# Patient Record
Sex: Female | Born: 1965 | Race: White | Hispanic: No | Marital: Single | Smoking: Former smoker
Health system: Southern US, Community
[De-identification: ages and names within clinical notes are randomized; demographics above are authoritative.]

## PROBLEM LIST (undated history)

## (undated) DIAGNOSIS — F32A Depression, unspecified: Secondary | ICD-10-CM

## (undated) DIAGNOSIS — Z9889 Other specified postprocedural states: Secondary | ICD-10-CM

## (undated) DIAGNOSIS — R609 Edema, unspecified: Secondary | ICD-10-CM

## (undated) DIAGNOSIS — C801 Malignant (primary) neoplasm, unspecified: Secondary | ICD-10-CM

## (undated) DIAGNOSIS — F329 Major depressive disorder, single episode, unspecified: Secondary | ICD-10-CM

## (undated) DIAGNOSIS — N3010 Interstitial cystitis (chronic) without hematuria: Secondary | ICD-10-CM

## (undated) DIAGNOSIS — F419 Anxiety disorder, unspecified: Secondary | ICD-10-CM

## (undated) DIAGNOSIS — IMO0002 Reserved for concepts with insufficient information to code with codable children: Secondary | ICD-10-CM

## (undated) DIAGNOSIS — R0602 Shortness of breath: Secondary | ICD-10-CM

## (undated) DIAGNOSIS — G43909 Migraine, unspecified, not intractable, without status migrainosus: Secondary | ICD-10-CM

## (undated) DIAGNOSIS — R112 Nausea with vomiting, unspecified: Secondary | ICD-10-CM

## (undated) DIAGNOSIS — M1480 Arthropathies in other specified diseases classified elsewhere, unspecified site: Secondary | ICD-10-CM

## (undated) DIAGNOSIS — K760 Fatty (change of) liver, not elsewhere classified: Secondary | ICD-10-CM

## (undated) DIAGNOSIS — M797 Fibromyalgia: Secondary | ICD-10-CM

## (undated) DIAGNOSIS — J4489 Other specified chronic obstructive pulmonary disease: Secondary | ICD-10-CM

## (undated) DIAGNOSIS — M810 Age-related osteoporosis without current pathological fracture: Secondary | ICD-10-CM

## (undated) DIAGNOSIS — K219 Gastro-esophageal reflux disease without esophagitis: Secondary | ICD-10-CM

## (undated) DIAGNOSIS — E119 Type 2 diabetes mellitus without complications: Secondary | ICD-10-CM

## (undated) DIAGNOSIS — G8929 Other chronic pain: Secondary | ICD-10-CM

## (undated) DIAGNOSIS — B343 Parvovirus infection, unspecified: Secondary | ICD-10-CM

## (undated) DIAGNOSIS — G4733 Obstructive sleep apnea (adult) (pediatric): Secondary | ICD-10-CM

## (undated) DIAGNOSIS — R7611 Nonspecific reaction to tuberculin skin test without active tuberculosis: Secondary | ICD-10-CM

## (undated) DIAGNOSIS — J383 Other diseases of vocal cords: Secondary | ICD-10-CM

## (undated) DIAGNOSIS — E039 Hypothyroidism, unspecified: Secondary | ICD-10-CM

## (undated) DIAGNOSIS — G629 Polyneuropathy, unspecified: Secondary | ICD-10-CM

## (undated) DIAGNOSIS — G4730 Sleep apnea, unspecified: Secondary | ICD-10-CM

## (undated) DIAGNOSIS — E785 Hyperlipidemia, unspecified: Secondary | ICD-10-CM

## (undated) DIAGNOSIS — J449 Chronic obstructive pulmonary disease, unspecified: Secondary | ICD-10-CM

## (undated) DIAGNOSIS — K3184 Gastroparesis: Secondary | ICD-10-CM

## (undated) DIAGNOSIS — T7840XA Allergy, unspecified, initial encounter: Secondary | ICD-10-CM

## (undated) DIAGNOSIS — I10 Essential (primary) hypertension: Secondary | ICD-10-CM

## (undated) HISTORY — DX: Nonspecific reaction to tuberculin skin test without active tuberculosis: R76.11

## (undated) HISTORY — PX: NASAL SINUS SURGERY: SHX719

## (undated) HISTORY — PX: SHOULDER ARTHROSCOPY W/ ROTATOR CUFF REPAIR: SHX2400

## (undated) HISTORY — PX: KNEE ARTHROSCOPY: SHX127

## (undated) HISTORY — DX: Fatty (change of) liver, not elsewhere classified: K76.0

## (undated) HISTORY — DX: Hypothyroidism, unspecified: E03.9

## (undated) HISTORY — DX: Migraine, unspecified, not intractable, without status migrainosus: G43.909

## (undated) HISTORY — DX: Depression, unspecified: F32.A

## (undated) HISTORY — DX: Arthropathies in other specified diseases classified elsewhere, unspecified site: M14.80

## (undated) HISTORY — DX: Edema, unspecified: R60.9

## (undated) HISTORY — PX: OTHER SURGICAL HISTORY: SHX169

## (undated) HISTORY — DX: Major depressive disorder, single episode, unspecified: F32.9

## (undated) HISTORY — DX: Reserved for concepts with insufficient information to code with codable children: IMO0002

## (undated) HISTORY — PX: CHOLECYSTECTOMY: SHX55

## (undated) HISTORY — DX: Parvovirus infection, unspecified: B34.3

## (undated) HISTORY — PX: CARPAL TUNNEL RELEASE: SHX101

## (undated) HISTORY — DX: Other specified chronic obstructive pulmonary disease: J44.89

## (undated) HISTORY — DX: Allergy, unspecified, initial encounter: T78.40XA

## (undated) HISTORY — DX: Chronic obstructive pulmonary disease, unspecified: J44.9

## (undated) HISTORY — DX: Other diseases of vocal cords: J38.3

## (undated) HISTORY — DX: Sleep apnea, unspecified: G47.30

## (undated) HISTORY — DX: Gastroparesis: K31.84

## (undated) HISTORY — DX: Fibromyalgia: M79.7

## (undated) HISTORY — PX: COLONOSCOPY: SHX174

## (undated) HISTORY — DX: Gastro-esophageal reflux disease without esophagitis: K21.9

## (undated) HISTORY — DX: Age-related osteoporosis without current pathological fracture: M81.0

## (undated) HISTORY — DX: Hyperlipidemia, unspecified: E78.5

---

## 1991-07-29 HISTORY — PX: ABDOMINAL HYSTERECTOMY: SHX81

## 1998-12-12 ENCOUNTER — Ambulatory Visit (HOSPITAL_BASED_OUTPATIENT_CLINIC_OR_DEPARTMENT_OTHER): Admission: RE | Admit: 1998-12-12 | Discharge: 1998-12-12 | Payer: Self-pay | Admitting: Orthopedic Surgery

## 1999-02-11 ENCOUNTER — Other Ambulatory Visit: Admission: RE | Admit: 1999-02-11 | Discharge: 1999-02-11 | Payer: Self-pay | Admitting: Obstetrics and Gynecology

## 1999-08-03 ENCOUNTER — Emergency Department (HOSPITAL_COMMUNITY): Admission: EM | Admit: 1999-08-03 | Discharge: 1999-08-03 | Payer: Self-pay | Admitting: Emergency Medicine

## 1999-08-04 ENCOUNTER — Encounter: Payer: Self-pay | Admitting: *Deleted

## 1999-10-21 ENCOUNTER — Encounter: Admission: RE | Admit: 1999-10-21 | Discharge: 1999-10-21 | Payer: Self-pay | Admitting: *Deleted

## 1999-10-21 ENCOUNTER — Encounter: Payer: Self-pay | Admitting: *Deleted

## 1999-10-24 ENCOUNTER — Encounter: Admission: RE | Admit: 1999-10-24 | Discharge: 1999-10-24 | Payer: Self-pay

## 1999-12-13 ENCOUNTER — Encounter (INDEPENDENT_AMBULATORY_CARE_PROVIDER_SITE_OTHER): Payer: Self-pay | Admitting: Specialist

## 1999-12-13 ENCOUNTER — Other Ambulatory Visit: Admission: RE | Admit: 1999-12-13 | Discharge: 1999-12-13 | Payer: Self-pay | Admitting: Otolaryngology

## 2000-07-28 HISTORY — PX: HIP SURGERY: SHX245

## 2000-09-02 ENCOUNTER — Encounter: Payer: Self-pay | Admitting: Family Medicine

## 2000-09-02 ENCOUNTER — Encounter: Admission: RE | Admit: 2000-09-02 | Discharge: 2000-09-02 | Payer: Self-pay | Admitting: Family Medicine

## 2000-09-04 ENCOUNTER — Encounter: Payer: Self-pay | Admitting: Family Medicine

## 2000-09-04 ENCOUNTER — Encounter: Admission: RE | Admit: 2000-09-04 | Discharge: 2000-09-04 | Payer: Self-pay | Admitting: Family Medicine

## 2001-01-22 ENCOUNTER — Ambulatory Visit (HOSPITAL_BASED_OUTPATIENT_CLINIC_OR_DEPARTMENT_OTHER): Admission: RE | Admit: 2001-01-22 | Discharge: 2001-01-23 | Payer: Self-pay | Admitting: Orthopedic Surgery

## 2001-07-22 ENCOUNTER — Encounter: Payer: Self-pay | Admitting: Family Medicine

## 2001-07-22 ENCOUNTER — Ambulatory Visit (HOSPITAL_COMMUNITY): Admission: RE | Admit: 2001-07-22 | Discharge: 2001-07-22 | Payer: Self-pay | Admitting: Family Medicine

## 2002-02-08 ENCOUNTER — Other Ambulatory Visit: Admission: RE | Admit: 2002-02-08 | Discharge: 2002-02-08 | Payer: Self-pay | Admitting: *Deleted

## 2002-07-28 HISTORY — PX: ANTERIOR CERVICAL DECOMP/DISCECTOMY FUSION: SHX1161

## 2002-09-06 ENCOUNTER — Ambulatory Visit (HOSPITAL_COMMUNITY): Admission: RE | Admit: 2002-09-06 | Discharge: 2002-09-06 | Payer: Self-pay | Admitting: Family Medicine

## 2002-09-06 ENCOUNTER — Encounter: Payer: Self-pay | Admitting: Family Medicine

## 2002-09-07 ENCOUNTER — Ambulatory Visit (HOSPITAL_COMMUNITY): Admission: RE | Admit: 2002-09-07 | Discharge: 2002-09-07 | Payer: Self-pay | Admitting: Hematology & Oncology

## 2002-09-07 ENCOUNTER — Encounter: Payer: Self-pay | Admitting: Hematology & Oncology

## 2002-12-14 ENCOUNTER — Ambulatory Visit (HOSPITAL_COMMUNITY): Admission: RE | Admit: 2002-12-14 | Discharge: 2002-12-14 | Payer: Self-pay | Admitting: Hematology & Oncology

## 2002-12-14 ENCOUNTER — Encounter: Payer: Self-pay | Admitting: Hematology & Oncology

## 2003-03-13 ENCOUNTER — Encounter: Payer: Self-pay | Admitting: Neurosurgery

## 2003-03-13 ENCOUNTER — Inpatient Hospital Stay (HOSPITAL_COMMUNITY): Admission: RE | Admit: 2003-03-13 | Discharge: 2003-03-14 | Payer: Self-pay | Admitting: Neurosurgery

## 2003-06-26 ENCOUNTER — Other Ambulatory Visit: Admission: RE | Admit: 2003-06-26 | Discharge: 2003-06-26 | Payer: Self-pay | Admitting: Obstetrics and Gynecology

## 2003-09-08 ENCOUNTER — Encounter (INDEPENDENT_AMBULATORY_CARE_PROVIDER_SITE_OTHER): Payer: Self-pay | Admitting: Specialist

## 2003-09-08 ENCOUNTER — Ambulatory Visit (HOSPITAL_COMMUNITY): Admission: RE | Admit: 2003-09-08 | Discharge: 2003-09-08 | Payer: Self-pay | Admitting: Obstetrics and Gynecology

## 2003-10-09 ENCOUNTER — Observation Stay (HOSPITAL_COMMUNITY): Admission: RE | Admit: 2003-10-09 | Discharge: 2003-10-10 | Payer: Self-pay | Admitting: General Surgery

## 2004-05-10 ENCOUNTER — Encounter (INDEPENDENT_AMBULATORY_CARE_PROVIDER_SITE_OTHER): Payer: Self-pay | Admitting: Specialist

## 2004-05-10 ENCOUNTER — Ambulatory Visit (HOSPITAL_COMMUNITY): Admission: RE | Admit: 2004-05-10 | Discharge: 2004-05-10 | Payer: Self-pay | Admitting: Gastroenterology

## 2004-07-26 ENCOUNTER — Ambulatory Visit (HOSPITAL_COMMUNITY): Admission: RE | Admit: 2004-07-26 | Discharge: 2004-07-26 | Payer: Self-pay | Admitting: Gastroenterology

## 2004-09-11 ENCOUNTER — Ambulatory Visit (HOSPITAL_COMMUNITY): Admission: RE | Admit: 2004-09-11 | Discharge: 2004-09-11 | Payer: Self-pay | Admitting: Family Medicine

## 2004-09-20 ENCOUNTER — Ambulatory Visit (HOSPITAL_COMMUNITY): Admission: RE | Admit: 2004-09-20 | Discharge: 2004-09-20 | Payer: Self-pay | Admitting: Gastroenterology

## 2004-10-17 ENCOUNTER — Encounter: Admission: RE | Admit: 2004-10-17 | Discharge: 2004-10-17 | Payer: Self-pay | Admitting: Family Medicine

## 2005-03-04 ENCOUNTER — Ambulatory Visit: Payer: Self-pay | Admitting: Hematology & Oncology

## 2005-05-08 ENCOUNTER — Ambulatory Visit: Payer: Self-pay | Admitting: Hematology & Oncology

## 2005-05-22 ENCOUNTER — Other Ambulatory Visit: Admission: RE | Admit: 2005-05-22 | Discharge: 2005-05-22 | Payer: Self-pay | Admitting: Obstetrics and Gynecology

## 2005-06-10 ENCOUNTER — Encounter: Admission: RE | Admit: 2005-06-10 | Discharge: 2005-06-10 | Payer: Self-pay | Admitting: Family Medicine

## 2005-07-03 ENCOUNTER — Ambulatory Visit: Payer: Self-pay | Admitting: Hematology & Oncology

## 2005-07-16 ENCOUNTER — Ambulatory Visit (HOSPITAL_COMMUNITY): Admission: RE | Admit: 2005-07-16 | Discharge: 2005-07-16 | Payer: Self-pay | Admitting: Hematology & Oncology

## 2005-07-30 ENCOUNTER — Encounter: Payer: Self-pay | Admitting: Hematology & Oncology

## 2005-09-05 ENCOUNTER — Ambulatory Visit: Payer: Self-pay | Admitting: Hematology & Oncology

## 2005-09-05 ENCOUNTER — Ambulatory Visit: Admission: RE | Admit: 2005-09-05 | Discharge: 2005-09-05 | Payer: Self-pay | Admitting: Hematology & Oncology

## 2005-09-09 ENCOUNTER — Ambulatory Visit (HOSPITAL_COMMUNITY): Admission: RE | Admit: 2005-09-09 | Discharge: 2005-09-09 | Payer: Self-pay | Admitting: Hematology & Oncology

## 2005-10-23 ENCOUNTER — Ambulatory Visit: Payer: Self-pay | Admitting: Hematology & Oncology

## 2005-11-21 LAB — CBC WITH DIFFERENTIAL/PLATELET
BASO%: 0.5 % (ref 0.0–2.0)
Basophils Absolute: 0.1 10*3/uL (ref 0.0–0.1)
EOS%: 8.5 % — ABNORMAL HIGH (ref 0.0–7.0)
Eosinophils Absolute: 0.9 10*3/uL — ABNORMAL HIGH (ref 0.0–0.5)
HCT: 40.9 % (ref 34.8–46.6)
HGB: 14.4 g/dL (ref 11.6–15.9)
LYMPH%: 26.8 % (ref 14.0–48.0)
MCH: 29.8 pg (ref 26.0–34.0)
MCHC: 35.2 g/dL (ref 32.0–36.0)
MCV: 84.8 fL (ref 81.0–101.0)
MONO#: 0.6 10*3/uL (ref 0.1–0.9)
MONO%: 5.6 % (ref 0.0–13.0)
NEUT#: 6.1 10*3/uL (ref 1.5–6.5)
NEUT%: 58.6 % (ref 39.6–76.8)
Platelets: 405 10*3/uL — ABNORMAL HIGH (ref 145–400)
RBC: 4.82 10*6/uL (ref 3.70–5.32)
RDW: 14.4 % (ref 11.3–14.5)
WBC: 10.4 10*3/uL — ABNORMAL HIGH (ref 3.9–10.0)
lymph#: 2.8 10*3/uL (ref 0.9–3.3)

## 2005-11-21 LAB — ERYTHROCYTE SEDIMENTATION RATE: Sed Rate: 17 mm/hr (ref 0–30)

## 2005-12-01 ENCOUNTER — Ambulatory Visit (HOSPITAL_COMMUNITY): Admission: RE | Admit: 2005-12-01 | Discharge: 2005-12-01 | Payer: Self-pay | Admitting: Hematology & Oncology

## 2005-12-09 ENCOUNTER — Ambulatory Visit: Payer: Self-pay | Admitting: Hematology & Oncology

## 2005-12-11 LAB — CBC WITH DIFFERENTIAL/PLATELET
BASO%: 1.2 % (ref 0.0–2.0)
Basophils Absolute: 0.2 10*3/uL — ABNORMAL HIGH (ref 0.0–0.1)
EOS%: 4.8 % (ref 0.0–7.0)
Eosinophils Absolute: 0.8 10*3/uL — ABNORMAL HIGH (ref 0.0–0.5)
HCT: 40.3 % (ref 34.8–46.6)
HGB: 14.3 g/dL (ref 11.6–15.9)
LYMPH%: 16.4 % (ref 14.0–48.0)
MCH: 30.2 pg (ref 26.0–34.0)
MCHC: 35.5 g/dL (ref 32.0–36.0)
MCV: 85 fL (ref 81.0–101.0)
MONO#: 0.7 10*3/uL (ref 0.1–0.9)
MONO%: 4.2 % (ref 0.0–13.0)
NEUT#: 12.2 10*3/uL — ABNORMAL HIGH (ref 1.5–6.5)
NEUT%: 73.4 % (ref 39.6–76.8)
Platelets: 409 10*3/uL — ABNORMAL HIGH (ref 145–400)
RBC: 4.74 10*6/uL (ref 3.70–5.32)
RDW: 14.3 % (ref 11.3–14.5)
WBC: 16.6 10*3/uL — ABNORMAL HIGH (ref 3.9–10.0)
lymph#: 2.7 10*3/uL (ref 0.9–3.3)

## 2005-12-11 LAB — COMPREHENSIVE METABOLIC PANEL
ALT: 29 U/L (ref 0–40)
AST: 19 U/L (ref 0–37)
Albumin: 4.8 g/dL (ref 3.5–5.2)
Alkaline Phosphatase: 76 U/L (ref 39–117)
BUN: 11 mg/dL (ref 6–23)
CO2: 25 mEq/L (ref 19–32)
Calcium: 9.7 mg/dL (ref 8.4–10.5)
Chloride: 100 mEq/L (ref 96–112)
Creatinine, Ser: 0.7 mg/dL (ref 0.4–1.2)
Glucose, Bld: 91 mg/dL (ref 70–99)
Potassium: 4.2 mEq/L (ref 3.5–5.3)
Sodium: 139 mEq/L (ref 135–145)
Total Bilirubin: 0.5 mg/dL (ref 0.3–1.2)
Total Protein: 8.1 g/dL (ref 6.0–8.3)

## 2005-12-11 LAB — TSH: TSH: 1.643 u[IU]/mL (ref 0.350–5.500)

## 2005-12-11 LAB — ERYTHROCYTE SEDIMENTATION RATE: Sed Rate: 8 mm/hr (ref 0–30)

## 2005-12-18 ENCOUNTER — Ambulatory Visit (HOSPITAL_COMMUNITY): Admission: RE | Admit: 2005-12-18 | Discharge: 2005-12-18 | Payer: Self-pay | Admitting: Hematology & Oncology

## 2006-01-07 LAB — CBC WITH DIFFERENTIAL/PLATELET
BASO%: 0.5 % (ref 0.0–2.0)
Basophils Absolute: 0 10*3/uL (ref 0.0–0.1)
EOS%: 11.1 % — ABNORMAL HIGH (ref 0.0–7.0)
Eosinophils Absolute: 1 10*3/uL — ABNORMAL HIGH (ref 0.0–0.5)
HCT: 36.2 % (ref 34.8–46.6)
HGB: 12.7 g/dL (ref 11.6–15.9)
LYMPH%: 28.6 % (ref 14.0–48.0)
MCH: 29.9 pg (ref 26.0–34.0)
MCHC: 35.1 g/dL (ref 32.0–36.0)
MCV: 85.1 fL (ref 81.0–101.0)
MONO#: 0.5 10*3/uL (ref 0.1–0.9)
MONO%: 5.4 % (ref 0.0–13.0)
NEUT#: 4.7 10*3/uL (ref 1.5–6.5)
NEUT%: 54.4 % (ref 39.6–76.8)
Platelets: 368 10*3/uL (ref 145–400)
RBC: 4.25 10*6/uL (ref 3.70–5.32)
RDW: 13.8 % (ref 11.3–14.5)
WBC: 8.7 10*3/uL (ref 3.9–10.0)
lymph#: 2.5 10*3/uL (ref 0.9–3.3)

## 2006-01-10 LAB — COMPREHENSIVE METABOLIC PANEL
ALT: 51 U/L — ABNORMAL HIGH (ref 0–40)
AST: 31 U/L (ref 0–37)
Albumin: 4.4 g/dL (ref 3.5–5.2)
Alkaline Phosphatase: 73 U/L (ref 39–117)
BUN: 6 mg/dL (ref 6–23)
CO2: 25 mEq/L (ref 19–32)
Calcium: 9.1 mg/dL (ref 8.4–10.5)
Chloride: 107 mEq/L (ref 96–112)
Creatinine, Ser: 0.62 mg/dL (ref 0.40–1.20)
Glucose, Bld: 115 mg/dL — ABNORMAL HIGH (ref 70–99)
Potassium: 3.7 mEq/L (ref 3.5–5.3)
Sodium: 140 mEq/L (ref 135–145)
Total Bilirubin: 0.5 mg/dL (ref 0.3–1.2)
Total Protein: 7.2 g/dL (ref 6.0–8.3)

## 2006-01-10 LAB — EPSTEIN-BARR VIRUS VCA, IGG: EBV VCA IgG: 4.56 {ISR} — ABNORMAL HIGH

## 2006-01-10 LAB — EPSTEIN-BARR VIRUS VCA, IGM: EBV VCA IgM: 0.68 {ISR}

## 2006-01-10 LAB — CMV ABS, IGG+IGM (CYTOMEGALOVIRUS)
CMV IgM: <0.9 Index (ref <0.90)
Cytomegalovirus Ab-IgG: 9.31 Index — ABNORMAL HIGH (ref <0.80)

## 2006-01-10 LAB — LACTATE DEHYDROGENASE: LDH: 167 U/L (ref 94–250)

## 2006-01-10 LAB — RPR

## 2006-01-10 LAB — EPSTEIN-BARR VIRUS EARLY D ANTIGEN ANTIBODY, IGG: EBV EA IgG: 0.57 {ISR}

## 2006-01-10 LAB — EPSTEIN-BARR VIRUS NUCLEAR ANTIGEN ANTIBODY, IGG: EBV NA IgG: 3.45 {ISR} — ABNORMAL HIGH

## 2006-01-10 LAB — URIC ACID: Uric Acid, Serum: 4.9 mg/dL (ref 2.4–7.0)

## 2006-03-02 ENCOUNTER — Ambulatory Visit: Payer: Self-pay | Admitting: Hematology & Oncology

## 2006-03-04 LAB — CBC WITH DIFFERENTIAL/PLATELET
BASO%: 0.4 % (ref 0.0–2.0)
Basophils Absolute: 0 10*3/uL (ref 0.0–0.1)
EOS%: 7.3 % — ABNORMAL HIGH (ref 0.0–7.0)
Eosinophils Absolute: 0.7 10*3/uL — ABNORMAL HIGH (ref 0.0–0.5)
HCT: 38 % (ref 34.8–46.6)
HGB: 13.5 g/dL (ref 11.6–15.9)
LYMPH%: 26.7 % (ref 14.0–48.0)
MCH: 30.3 pg (ref 26.0–34.0)
MCHC: 35.6 g/dL (ref 32.0–36.0)
MCV: 85.1 fL (ref 81.0–101.0)
MONO#: 0.5 10*3/uL (ref 0.1–0.9)
MONO%: 4.9 % (ref 0.0–13.0)
NEUT#: 5.9 10*3/uL (ref 1.5–6.5)
NEUT%: 60.7 % (ref 39.6–76.8)
Platelets: 370 10*3/uL (ref 145–400)
RBC: 4.46 10*6/uL (ref 3.70–5.32)
RDW: 13.9 % (ref 11.3–14.5)
WBC: 9.6 10*3/uL (ref 3.9–10.0)
lymph#: 2.6 10*3/uL (ref 0.9–3.3)

## 2006-03-04 LAB — COMPREHENSIVE METABOLIC PANEL
ALT: 67 U/L — ABNORMAL HIGH (ref 0–40)
AST: 46 U/L — ABNORMAL HIGH (ref 0–37)
Albumin: 4.5 g/dL (ref 3.5–5.2)
Alkaline Phosphatase: 71 U/L (ref 39–117)
BUN: 9 mg/dL (ref 6–23)
CO2: 27 mEq/L (ref 19–32)
Calcium: 9.2 mg/dL (ref 8.4–10.5)
Chloride: 102 mEq/L (ref 96–112)
Creatinine, Ser: 0.6 mg/dL (ref 0.40–1.20)
Glucose, Bld: 100 mg/dL — ABNORMAL HIGH (ref 70–99)
Potassium: 4 mEq/L (ref 3.5–5.3)
Sodium: 140 mEq/L (ref 135–145)
Total Bilirubin: 0.6 mg/dL (ref 0.3–1.2)
Total Protein: 7.1 g/dL (ref 6.0–8.3)

## 2006-03-04 LAB — LACTATE DEHYDROGENASE: LDH: 172 U/L (ref 94–250)

## 2006-05-06 ENCOUNTER — Encounter: Admission: RE | Admit: 2006-05-06 | Discharge: 2006-05-06 | Payer: Self-pay | Admitting: Obstetrics and Gynecology

## 2006-06-25 ENCOUNTER — Encounter: Admission: RE | Admit: 2006-06-25 | Discharge: 2006-06-25 | Payer: Self-pay | Admitting: Obstetrics and Gynecology

## 2006-08-25 ENCOUNTER — Emergency Department (HOSPITAL_COMMUNITY): Admission: EM | Admit: 2006-08-25 | Discharge: 2006-08-26 | Payer: Self-pay | Admitting: Emergency Medicine

## 2006-08-31 ENCOUNTER — Ambulatory Visit: Payer: Self-pay

## 2006-08-31 ENCOUNTER — Ambulatory Visit: Payer: Self-pay | Admitting: Internal Medicine

## 2006-08-31 LAB — CONVERTED CEMR LAB
Free T4: 0.5 ng/dL — ABNORMAL LOW (ref 0.6–1.6)
Pro B Natriuretic peptide (BNP): 9 pg/mL (ref 0.0–100.0)
TSH: 3.22 microintl units/mL (ref 0.35–5.50)

## 2006-09-02 ENCOUNTER — Ambulatory Visit: Payer: Self-pay

## 2006-09-02 ENCOUNTER — Encounter: Payer: Self-pay | Admitting: Cardiology

## 2006-09-03 ENCOUNTER — Ambulatory Visit: Payer: Self-pay | Admitting: Internal Medicine

## 2006-09-08 ENCOUNTER — Ambulatory Visit: Payer: Self-pay | Admitting: Family Medicine

## 2006-09-08 ENCOUNTER — Inpatient Hospital Stay (HOSPITAL_COMMUNITY): Admission: EM | Admit: 2006-09-08 | Discharge: 2006-09-11 | Payer: Self-pay | Admitting: Emergency Medicine

## 2006-09-09 ENCOUNTER — Ambulatory Visit: Payer: Self-pay | Admitting: Vascular Surgery

## 2006-09-25 ENCOUNTER — Ambulatory Visit: Payer: Self-pay | Admitting: Internal Medicine

## 2006-10-01 ENCOUNTER — Ambulatory Visit: Payer: Self-pay | Admitting: Critical Care Medicine

## 2006-10-15 ENCOUNTER — Ambulatory Visit (HOSPITAL_BASED_OUTPATIENT_CLINIC_OR_DEPARTMENT_OTHER): Admission: RE | Admit: 2006-10-15 | Discharge: 2006-10-15 | Payer: Self-pay | Admitting: Critical Care Medicine

## 2006-10-15 ENCOUNTER — Encounter: Payer: Self-pay | Admitting: Critical Care Medicine

## 2006-10-20 ENCOUNTER — Ambulatory Visit: Payer: Self-pay | Admitting: Pulmonary Disease

## 2006-10-29 ENCOUNTER — Ambulatory Visit: Payer: Self-pay | Admitting: Critical Care Medicine

## 2006-11-26 ENCOUNTER — Ambulatory Visit: Payer: Self-pay | Admitting: Pulmonary Disease

## 2006-12-07 ENCOUNTER — Ambulatory Visit: Payer: Self-pay | Admitting: Internal Medicine

## 2006-12-11 ENCOUNTER — Ambulatory Visit: Payer: Self-pay | Admitting: Internal Medicine

## 2006-12-30 ENCOUNTER — Ambulatory Visit: Payer: Self-pay | Admitting: Gastroenterology

## 2006-12-30 LAB — CONVERTED CEMR LAB
ALT: 120 units/L — ABNORMAL HIGH (ref 0–40)
AST: 98 units/L — ABNORMAL HIGH (ref 0–37)
Albumin: 4.1 g/dL (ref 3.5–5.2)
Alkaline Phosphatase: 83 units/L (ref 39–117)
BUN: 7 mg/dL (ref 6–23)
Basophils Absolute: 0 10*3/uL (ref 0.0–0.1)
Basophils Relative: 0.3 % (ref 0.0–1.0)
Bilirubin, Direct: 0.2 mg/dL (ref 0.0–0.3)
CO2: 29 meq/L (ref 19–32)
Calcium: 9 mg/dL (ref 8.4–10.5)
Chloride: 101 meq/L (ref 96–112)
Creatinine, Ser: 0.6 mg/dL (ref 0.4–1.2)
Eosinophils Absolute: 1.2 10*3/uL — ABNORMAL HIGH (ref 0.0–0.6)
Eosinophils Relative: 11.6 % — ABNORMAL HIGH (ref 0.0–5.0)
GFR calc Af Amer: 142 mL/min
GFR calc non Af Amer: 118 mL/min
Glucose, Bld: 121 mg/dL — ABNORMAL HIGH (ref 70–99)
HCT: 38.8 % (ref 36.0–46.0)
Hemoglobin: 13.9 g/dL (ref 12.0–15.0)
Lymphocytes Relative: 35 % (ref 12.0–46.0)
MCHC: 35.8 g/dL (ref 30.0–36.0)
MCV: 84 fL (ref 78.0–100.0)
Monocytes Absolute: 0.6 10*3/uL (ref 0.2–0.7)
Monocytes Relative: 5.9 % (ref 3.0–11.0)
Neutro Abs: 4.9 10*3/uL (ref 1.4–7.7)
Neutrophils Relative %: 47.2 % (ref 43.0–77.0)
Platelets: 418 10*3/uL — ABNORMAL HIGH (ref 150–400)
Potassium: 4.1 meq/L (ref 3.5–5.1)
RBC: 4.62 M/uL (ref 3.87–5.11)
RDW: 13.5 % (ref 11.5–14.6)
Sodium: 140 meq/L (ref 135–145)
TSH: 4.33 microintl units/mL (ref 0.35–5.50)
Total Bilirubin: 0.8 mg/dL (ref 0.3–1.2)
Total Protein: 7.4 g/dL (ref 6.0–8.3)
WBC: 10.3 10*3/uL (ref 4.5–10.5)

## 2007-01-05 ENCOUNTER — Ambulatory Visit: Payer: Self-pay | Admitting: Gastroenterology

## 2007-01-15 ENCOUNTER — Ambulatory Visit: Payer: Self-pay | Admitting: Gastroenterology

## 2007-01-15 LAB — CONVERTED CEMR LAB
Anti Nuclear Antibody(ANA): NEGATIVE
Ceruloplasmin: 48 mg/dL (ref 21–63)
Ferritin: 432.9 ng/mL — ABNORMAL HIGH (ref 10.0–291.0)
HCV Ab: NEGATIVE
Hep A Total Ab: NEGATIVE
Hep B S Ab: NEGATIVE
Hepatitis B Surface Ag: NEGATIVE
INR: 1.1 (ref 0.9–2.0)
Iron: 68 ug/dL (ref 42–145)
Prothrombin Time: 12.7 s (ref 10.0–14.0)
Saturation Ratios: 19.8 % — ABNORMAL LOW (ref 20.0–50.0)
Transferrin: 245 mg/dL (ref >=212.0)

## 2007-01-18 ENCOUNTER — Ambulatory Visit: Payer: Self-pay | Admitting: Internal Medicine

## 2007-01-27 ENCOUNTER — Ambulatory Visit: Payer: Self-pay | Admitting: Gastroenterology

## 2007-02-15 ENCOUNTER — Ambulatory Visit: Payer: Self-pay | Admitting: Internal Medicine

## 2007-02-18 ENCOUNTER — Encounter: Payer: Self-pay | Admitting: Gastroenterology

## 2007-02-18 ENCOUNTER — Ambulatory Visit (HOSPITAL_COMMUNITY): Admission: RE | Admit: 2007-02-18 | Discharge: 2007-02-18 | Payer: Self-pay | Admitting: Gastroenterology

## 2007-03-09 ENCOUNTER — Ambulatory Visit: Payer: Self-pay | Admitting: Gastroenterology

## 2007-06-18 ENCOUNTER — Ambulatory Visit: Payer: Self-pay | Admitting: Internal Medicine

## 2007-06-18 LAB — CONVERTED CEMR LAB
Basophils Absolute: 0.2 10*3/uL — ABNORMAL HIGH (ref 0.0–0.1)
Basophils Relative: 1.7 % — ABNORMAL HIGH (ref 0.0–1.0)
Eosinophils Absolute: 1 10*3/uL — ABNORMAL HIGH (ref 0.0–0.6)
Eosinophils Relative: 9.6 % — ABNORMAL HIGH (ref 0.0–5.0)
HCT: 37.4 % (ref 36.0–46.0)
Hemoglobin: 13.3 g/dL (ref 12.0–15.0)
IgE (Immunoglobulin E), Serum: 586.8 intl units/mL — ABNORMAL HIGH (ref 0.0–180.0)
Lymphocytes Relative: 33 % (ref 12.0–46.0)
MCHC: 35.5 g/dL (ref 30.0–36.0)
MCV: 85.9 fL (ref 78.0–100.0)
Monocytes Absolute: 0.6 10*3/uL (ref 0.2–0.7)
Monocytes Relative: 5.3 % (ref 3.0–11.0)
Neutro Abs: 5.3 10*3/uL (ref 1.4–7.7)
Neutrophils Relative %: 50.4 % (ref 43.0–77.0)
Platelets: 395 10*3/uL (ref 150–400)
RBC: 4.36 M/uL (ref 3.87–5.11)
RDW: 13.8 % (ref 11.5–14.6)
WBC: 10.6 10*3/uL — ABNORMAL HIGH (ref 4.5–10.5)

## 2007-06-28 ENCOUNTER — Encounter: Admission: RE | Admit: 2007-06-28 | Discharge: 2007-06-28 | Payer: Self-pay | Admitting: Obstetrics and Gynecology

## 2007-07-01 ENCOUNTER — Telehealth: Payer: Self-pay | Admitting: Internal Medicine

## 2008-02-25 ENCOUNTER — Inpatient Hospital Stay (HOSPITAL_COMMUNITY): Admission: EM | Admit: 2008-02-25 | Discharge: 2008-02-28 | Payer: Self-pay | Admitting: Emergency Medicine

## 2008-03-13 ENCOUNTER — Encounter: Admission: RE | Admit: 2008-03-13 | Discharge: 2008-03-13 | Payer: Self-pay | Admitting: Family Medicine

## 2008-08-22 ENCOUNTER — Encounter: Admission: RE | Admit: 2008-08-22 | Discharge: 2008-08-22 | Payer: Self-pay | Admitting: Obstetrics and Gynecology

## 2008-10-16 ENCOUNTER — Inpatient Hospital Stay (HOSPITAL_COMMUNITY): Admission: EM | Admit: 2008-10-16 | Discharge: 2008-10-20 | Payer: Self-pay | Admitting: Emergency Medicine

## 2008-10-20 ENCOUNTER — Inpatient Hospital Stay (HOSPITAL_COMMUNITY): Admission: RE | Admit: 2008-10-20 | Discharge: 2008-10-24 | Payer: Self-pay | Admitting: *Deleted

## 2008-10-20 ENCOUNTER — Ambulatory Visit: Payer: Self-pay | Admitting: *Deleted

## 2008-11-25 ENCOUNTER — Emergency Department (HOSPITAL_COMMUNITY): Admission: EM | Admit: 2008-11-25 | Discharge: 2008-11-25 | Payer: Self-pay | Admitting: Emergency Medicine

## 2009-01-21 ENCOUNTER — Encounter: Admission: RE | Admit: 2009-01-21 | Discharge: 2009-01-21 | Payer: Self-pay | Admitting: Neurology

## 2009-04-09 ENCOUNTER — Ambulatory Visit: Payer: Self-pay | Admitting: Internal Medicine

## 2009-04-09 DIAGNOSIS — J45909 Unspecified asthma, uncomplicated: Secondary | ICD-10-CM | POA: Insufficient documentation

## 2009-04-09 DIAGNOSIS — K219 Gastro-esophageal reflux disease without esophagitis: Secondary | ICD-10-CM | POA: Insufficient documentation

## 2009-04-09 DIAGNOSIS — G9332 Myalgic encephalomyelitis/chronic fatigue syndrome: Secondary | ICD-10-CM | POA: Insufficient documentation

## 2009-04-09 DIAGNOSIS — E109 Type 1 diabetes mellitus without complications: Secondary | ICD-10-CM | POA: Insufficient documentation

## 2009-04-09 DIAGNOSIS — R5382 Chronic fatigue, unspecified: Secondary | ICD-10-CM | POA: Insufficient documentation

## 2009-04-09 DIAGNOSIS — J383 Other diseases of vocal cords: Secondary | ICD-10-CM | POA: Insufficient documentation

## 2009-04-30 ENCOUNTER — Telehealth (INDEPENDENT_AMBULATORY_CARE_PROVIDER_SITE_OTHER): Payer: Self-pay | Admitting: *Deleted

## 2009-05-01 DIAGNOSIS — G4710 Hypersomnia, unspecified: Secondary | ICD-10-CM | POA: Insufficient documentation

## 2009-05-01 DIAGNOSIS — G4730 Sleep apnea, unspecified: Secondary | ICD-10-CM

## 2009-05-03 ENCOUNTER — Ambulatory Visit: Payer: Self-pay | Admitting: Internal Medicine

## 2009-05-04 ENCOUNTER — Telehealth: Payer: Self-pay | Admitting: Internal Medicine

## 2009-05-29 ENCOUNTER — Ambulatory Visit: Payer: Self-pay | Admitting: Gastroenterology

## 2009-06-06 ENCOUNTER — Ambulatory Visit: Payer: Self-pay | Admitting: Cardiology

## 2009-06-06 DIAGNOSIS — R9431 Abnormal electrocardiogram [ECG] [EKG]: Secondary | ICD-10-CM | POA: Insufficient documentation

## 2009-06-06 DIAGNOSIS — I10 Essential (primary) hypertension: Secondary | ICD-10-CM | POA: Insufficient documentation

## 2009-06-06 DIAGNOSIS — R000 Tachycardia, unspecified: Secondary | ICD-10-CM | POA: Insufficient documentation

## 2009-06-18 ENCOUNTER — Encounter: Payer: Self-pay | Admitting: Internal Medicine

## 2009-06-25 ENCOUNTER — Telehealth: Payer: Self-pay | Admitting: Internal Medicine

## 2009-08-31 ENCOUNTER — Ambulatory Visit: Payer: Self-pay | Admitting: Internal Medicine

## 2009-11-01 ENCOUNTER — Telehealth (INDEPENDENT_AMBULATORY_CARE_PROVIDER_SITE_OTHER): Payer: Self-pay | Admitting: *Deleted

## 2010-02-20 ENCOUNTER — Telehealth (INDEPENDENT_AMBULATORY_CARE_PROVIDER_SITE_OTHER): Payer: Self-pay | Admitting: *Deleted

## 2010-02-22 ENCOUNTER — Ambulatory Visit: Payer: Self-pay | Admitting: Internal Medicine

## 2010-02-27 ENCOUNTER — Ambulatory Visit (HOSPITAL_COMMUNITY): Admission: RE | Admit: 2010-02-27 | Discharge: 2010-02-27 | Payer: Self-pay | Admitting: Family Medicine

## 2010-05-06 LAB — HEMOGLOBIN A1C: Hgb A1c MFr Bld: 6.3 % — AB (ref 4.0–6.0)

## 2010-05-17 ENCOUNTER — Ambulatory Visit (HOSPITAL_COMMUNITY): Admission: RE | Admit: 2010-05-17 | Discharge: 2010-05-17 | Payer: Self-pay | Admitting: Family Medicine

## 2010-05-17 ENCOUNTER — Encounter: Payer: Self-pay | Admitting: Internal Medicine

## 2010-06-24 ENCOUNTER — Ambulatory Visit: Payer: Self-pay | Admitting: Internal Medicine

## 2010-08-18 ENCOUNTER — Encounter: Payer: Self-pay | Admitting: Family Medicine

## 2010-08-26 ENCOUNTER — Telehealth (INDEPENDENT_AMBULATORY_CARE_PROVIDER_SITE_OTHER): Payer: Self-pay | Admitting: *Deleted

## 2010-08-28 ENCOUNTER — Encounter: Payer: Self-pay | Admitting: Internal Medicine

## 2010-08-28 ENCOUNTER — Ambulatory Visit (INDEPENDENT_AMBULATORY_CARE_PROVIDER_SITE_OTHER): Payer: Medicare Other | Admitting: Internal Medicine

## 2010-08-28 DIAGNOSIS — J328 Other chronic sinusitis: Secondary | ICD-10-CM | POA: Insufficient documentation

## 2010-08-28 DIAGNOSIS — G9332 Myalgic encephalomyelitis/chronic fatigue syndrome: Secondary | ICD-10-CM

## 2010-08-28 DIAGNOSIS — J45909 Unspecified asthma, uncomplicated: Secondary | ICD-10-CM

## 2010-08-28 DIAGNOSIS — R5382 Chronic fatigue, unspecified: Secondary | ICD-10-CM

## 2010-08-28 DIAGNOSIS — J383 Other diseases of vocal cords: Secondary | ICD-10-CM

## 2010-08-28 DIAGNOSIS — G4710 Hypersomnia, unspecified: Secondary | ICD-10-CM

## 2010-08-29 NOTE — Progress Notes (Signed)
Summary: refill hydromet --  needs OV  Phone Note Refill Request Message from:  faxed refill request from pharmacy on February 20, 2010 3:01 PM  Refills Requested: Medication #1:  HYDROCODONE-HOMATROPINE 5-1.5 MG/5ML SYRP 1 teaspoon four times a day as needed cough.   Dosage confirmed as above?Dosage Confirmed last seen 4.4.11 and told to follow up in 3 months.   Method Requested: Telephone to Pharmacy Next Appointment Scheduled: no upcoming Initial call taken by: Parke Poisson CNA/MA,  February 20, 2010 3:03 PM  Follow-up for Phone Call        She keeps calling back for cough syrup. Suggest she make do with Delsym and make an office appointment.  Pt scheduled an appt today at 3:45p.Netta Neat  February 21, 2010 8:11 AM  Follow-up by: Deneise Lever MD,  February 20, 2010 8:31 PM  Additional Follow-up for Phone Call Additional follow up Details #1::        Pt has appt scheduled for July 29 at 9am with Dr. [name].  She can discuss hydromet rx with him at that time.  Will sign off on note.  Raymondo Band RN  February 21, 2010 8:50 AM

## 2010-08-29 NOTE — Assessment & Plan Note (Signed)
Summary: rov 4 months///kp   Primary Provider/Referring Provider:  Morrie Sheldon, MD  CC:  4 month follow up visit-wheezing, few asthma attacks, and sneezing(using nasal sprays). Sleep- waking up "alot" and other days sleeping all day and not able to wake up well enough..  History of Present Illness:  August 31, 2009- Allergic rhinitis, OSA, DM, GERD........................[name] here CPAP fixed at 10. Using CPAP every night and getting through nmost of the night. We discussed comfort and expectations. Got flu vax. Now acutely ill starting yesterday. Dry cough hurts mid chest, chilling. Some diarrhea and nausea.  February 22, 2010- Allergic rhinitis, OSA, DM, GERD..........................[name] here Stays in [place]. Mother says she never changes. Wheezey cough, generally dry, tussive soreness substernal. No fever. Feels "weak". Felt better for awhile living with her mother. Stopped using CPAP in the last month. Has lost 8 lbs since last here. Discussed in terms of improving her OSA. Lives in an old house with 6 yo daughter who works 2 part-time jobs and smokes in the home..  They say only hydromet cough syrup stops her cough. I explained that i don't want to maintain her on narcotics. PFT- Spirometry today: FVC 2.25/ 66%; FEV1 1.92/ 70%; R 0.85; 25-75% 79%. Parallel reduction of flow and volume may mean mixed defects, with mild obstructive disease, but I suspect mainly weak effort.  June 24, 2010- Allergic rhinitis, OSA, DM, GERD..........................[name] here Nurse-CC: 4 month follow up visit-wheezing, few asthma attacks, sneezing(using nasal sprays). Sleep- waking up "alot" and other days sleeping all day and not able to wake up well enough. Now on cefdinir for sinusitis. Sneezing all Fall. Had CT sinus at [facility]- "chronic sinusitis".  Mother reports fall 2 weeks ago- fell onto knees and hands when knees buckled. [name] now when she stands. Evaluated at [facility]. Anticipate  referral to [facility] neurology. Had flu vax. Still lives same house.  Continues CPAP. When too tight she got pressure marks- discussed. Not smoking. Getting divorced- looking at options.   Asthma History    Asthma Control Assessment:    Age range: 12+ years    Symptoms: >2 days/week    Nighttime Awakenings: 0-2/month    Interferes w/ normal activity: no limitations    SABA use (not for EIB): 0-2 days/week    FEV1: 1.92 liters (today)    FEV1 Pred: 2.75 liters (today)    Asthma Control Assessment: Not Well Controlled   Preventive Screening-Counseling & Management  Alcohol-Tobacco     Smoking Status: quit     Packs/Day: <0.25     Year Quit: 2010     Passive Smoke Exposure: yes     Passive Smoke Counseling: to avoid passive smoke exposure  Current Medications (verified): 1)  Diazepam 5 Mg Tabs (Diazepam) .... Take 1 Tablet By Mouth Four Times A Day 2)  Cymbalta 60 Mg Cpep (Duloxetine Hcl) .[name].. 1 By Mouth Twice  Daily 3)  Trazodone Hcl 100 Mg Tabs (Trazodone Hcl) .... Take 1 Tablet By Mouth Twice A Day 4)  Bystolic 10 Mg Tabs (Nebivolol Hcl) .... Take 1 Tablet By Mouth Once A Day 5)  Furosemide 40 Mg Tabs (Furosemide) .... As Needed 6)  Meclizine Hcl 25 Mg Tabs (Meclizine Hcl) .... As Needed 7)  Baclofen 20 Mg Tabs (Baclofen) .... Take 1 Tablet By Mouth Four Times A Day 8)  Phenergan 25 Mg/ml Soln (Promethazine Hcl) .... As Needed Nausea 9)  Gabapentin 800 Mg Tabs (Gabapentin) .... As Needed Three Times A Day 10)  Cpap 10  Laynes 11)  Lantus 100 Unit/ml Soln (Insulin Glargine) .... 20 Units Once Daily 12)  Humalog 100 Unit/ml Soln (Insulin Lispro (Human)) .[name].. 15 Unites Three Times A Day 13)  Proair Hfa 108 (90 Base) Mcg/act Aers (Albuterol Sulfate) .... 2 Puffs Four Times A Day As Needed Rescue 14)  Lidoderm 5 % Ptch (Lidocaine) .[name].. 12 Hours On / 12 Hours Off 15)  Symbicort 160-4.5 Mcg/act Aero (Budesonide-Formoterol Fumarate) .... Two Times A Day 16)  Reglan 5 Mg Tabs  (Metoclopramide Hcl) .... Four Times A Day 17)  Hydrocodone-Acetaminophen 7.5-325 Mg Tabs (Hydrocodone-Acetaminophen) .[name].. 1 By Mouth Three Times A Day 18)  Omeprazole 40 Mg Cpdr (Omeprazole) .[name].. 1 By Mouth Daily 19)  Crestor 20 Mg Tabs (Rosuvastatin Calcium) .... Take 1 By Mouth Once Daily 20)  Imitrex 100 Mg Tabs (Sumatriptan Succinate) .... As Needed 21)  Cardizem La 240 Mg Xr24h-Tab (Diltiazem Hcl Coated Beads) .... One By Mouth Daily 22)  Levothroid 112 Mcg Tabs (Levothyroxine Sodium) .... Take 1 By Mouth Once Daily 23)  Albuterol Sulfate (2.5 Mg/59m) 0.083% Nebu (Albuterol Sulfate) .... Four Times A Day Via Nebulizer As Needed 24)  Vitamin D3 50000 Unit Caps (Cholecalciferol) .... Take 1 By Mouth Weekly  Allergies (verified): 1)  ! Sulfa 2)  ! Cipro 3)  ! * Ct Dye 4)  ! Amoxicillin 5)  ! Levaquin 6)  ! * Lyrica 7)  ! * Nucynta  Past History:  Past Medical History: Last updated: 112/07/10GERD Gastroparesis Diabetes x 3 years Sleep Apnea (CPAP) COPD Hypertension years Asthma Hyperlipidemia Hyperthyroidism Depression Endomteriosis Fibromyalgia  Past Surgical History: Last updated: 112/07/10Cholecystectomy Hysterectomy Sinus surgery x 3 Knee (knee cap) Shoulder (spur) Right hip (Iliotibial band) C-spine Lysis of adhesions- multiple laparoscopies  Family History: Last updated: 112-07-2010Father- died MI age 44  Otherwise negative for CAD in first degree relatives..  Social History: Last updated: 06/24/2010 Patient states former smoker. - light social- quit Divorced 1st, separated 2nd.  Lives with daughter  Risk Factors: Smoking Status: quit (06/24/2010) Packs/Day: <0.25 (06/24/2010) Passive Smoke Exposure: yes (06/24/2010)  Social History: Patient states former smoker. - light social- quit Divorced 1st, separated 2nd.  Lives with daughter  Review of Systems      See HPI       The patient complains of shortness of breath with activity, nasal  congestion/difficulty breathing through nose, and sneezing.  The patient denies shortness of breath at rest, productive cough, non-productive cough, coughing up blood, chest pain, irregular heartbeats, acid heartburn, indigestion, loss of appetite, weight change, abdominal pain, difficulty swallowing, sore throat, tooth/dental problems, headaches, rash, and fever.    Vital Signs:  Patient profile:   45year old female Height:      62 inches Weight:      186.25 pounds BMI:     34.19 O2 Sat:      95 % on Room air Pulse rate:   96 / minute BP sitting:   108 / 62  (left arm) Cuff size:   regular  Vitals Entered By: KClayborne DanaCMA (June 24, 2010 9:42 AM)  O2 Flow:  Room air CC: 4 month follow up visit-wheezing, few asthma attacks, sneezing(using nasal sprays). Sleep- waking up "alot" and other days sleeping all day and not able to wake up well enough.   Physical Exam  Additional Exam:  General: A/Ox3; pleasant and cooperative, NAD, obese, 95% room air SKIN: no rash, lesions NODES: no lymphadenopathy HEENT: [place]/AT, EOM- WNL, Conjuctivae- clear,  PERRLA- chronically dilated, TM-WNL, Nose- clear, Throat- Mallampati II, hoarse/ strained voice, no stridor, erythema, no drainage or exudate NECK: Supple w/ fair ROM, JVD- none, normal carotid impulses w/o bruits Thyroid-  CHEST: No cough wheeze or rales HEART: RRR, no m/g/r heard ABDOMEN: Soft and nl; ACQ:PEAK, nl pulses, no edema  NEURO: Grossly intact to observation, uses rolling walker        Pre-Spirometry FEV1    Value: 1.92 L     Pred: 2.75 L     Impression & Recommendations:  Problem # 1:  ASTHMA (ICD-493.90) Describes poor control, but exam unremarkable now. Mother blames mold in home and says their lawyer is trying to get ex husband to pay to move her. Current meds are good enough in her situation.   Problem # 2:  HYPERSOMNIA WITH SLEEP APNEA UNSPECIFIED (ICD-780.53)  She needs to work with DME on mask fit. I  emphasized motivation to make CPAP work and pointed out advantage of the CPAP air filter.  Medications Added to Medication List This Visit: 1)  Vitamin D3 50000 Unit Caps (Cholecalciferol) .... Take 1 by mouth weekly  Other Orders: Est. Patient Level IV (35075) DME Referral (DME)  Patient Instructions: 1)  Please schedule a follow-up appointment in 4 months. 2)  Show your current mask to the people at [facility] so they understand what needs to fit better.

## 2010-08-29 NOTE — Assessment & Plan Note (Signed)
Summary: cough/jd ok per katie/cb   Primary Provider/Referring Provider:  Morrie Sheldon, MD  CC:  Accute visit-cough-3 weeks; no better and wheezing.[name]  History of Present Illness:  August 31, 2009- Allergic rhinitis, OSA, DM, GERD........................[name] here CPAP fixed at 10. Using CPAP every night and getting through nmost of the night. We discussed comfort and expectations. Got flu vax. Now acutely ill starting yesterday. Dry cough hurts mid chest, chilling. Some diarrhea and nausea.  February 22, 2010- Allergic rhinitis, OSA, DM, GERD..........................[name] here Stays in [place]. Mother says she never changes. Wheezey cough, generally dry, tussive soreness substernal. No fever. Feels "weak". Felt better for awhile living with her mother. Stopped using CPAP in the last month. Has lost 8 lbs since last here. Discussed in terms of improving her OSA. Lives in an old house with 30 yo daughter who works 2 part-time jobs and smokes in the home..  They say only hydromet cough syrup stops her cough. I explained that i don't want to maintain her on narcotics. PFT- Spirometry today: FVC 2.25/ 66%; FEV1 1.92/ 70%; R 0.85; 25-75% 79%. Parallel reduction of flow and volume may mean mixed defects, with mild obstructive disease, but I suspect mainly weak effort.    Asthma History    Initial Asthma Severity Rating:    Age range: 12+ years    Symptoms: daily    Nighttime Awakenings: 0-2/month    Interferes w/ normal activity: some limitations    SABA use (not for EIB): several times per day    Asthma Severity Assessment: Severe Persistent   Preventive Screening-Counseling & Management  Alcohol-Tobacco     Smoking Status: quit     Packs/Day: <0.25     Year Quit: 2010     Passive Smoke Exposure: yes     Passive Smoke Counseling: to avoid passive smoke exposure  Current Medications (verified): 1)  Diazepam 5 Mg Tabs (Diazepam) .... Take 1 Tablet By Mouth Four Times A  Day 2)  Cymbalta 60 Mg Cpep (Duloxetine Hcl) .[name].. 1 By Mouth Twice  Daily 3)  Trazodone Hcl 100 Mg Tabs (Trazodone Hcl) .... Take 1 Tablet By Mouth Twice A Day 4)  Bystolic 10 Mg Tabs (Nebivolol Hcl) .... Take 1 Tablet By Mouth Once A Day 5)  Furosemide 40 Mg Tabs (Furosemide) .... As Needed 6)  Meclizine Hcl 25 Mg Tabs (Meclizine Hcl) .... As Needed 7)  Baclofen 20 Mg Tabs (Baclofen) .... Take 1 Tablet By Mouth Four Times A Day 8)  Phenergan 25 Mg/ml Soln (Promethazine Hcl) .... As Needed Nausea 9)  Actoplus Met 15-500 Mg Tabs (Pioglitazone Hcl-Metformin Hcl) .... Take 1 By Mouth Two Times A Day 10)  Gabapentin 800 Mg Tabs (Gabapentin) .... As Needed Three Times A Day 11)  Cpap 10 Laynes 12)  Lantus 100 Unit/ml Soln (Insulin Glargine) .... 20 Units Once Daily 13)  Humalog 100 Unit/ml Soln (Insulin Lispro (Human)) .[name].. 15 Unites Three Times A Day 14)  Proair Hfa 108 (90 Base) Mcg/act Aers (Albuterol Sulfate) .... 2 Puffs Four Times A Day As Needed Rescue 15)  Lidoderm 5 % Ptch (Lidocaine) .[name].. 12 Hours On / 12 Hours Off 16)  Symbicort 160-4.5 Mcg/act Aero (Budesonide-Formoterol Fumarate) .... Two Times A Day 17)  Reglan 5 Mg Tabs (Metoclopramide Hcl) .... Four Times A Day 18)  Hydrocodone-Acetaminophen 7.5-325 Mg Tabs (Hydrocodone-Acetaminophen) .[name].. 1 By Mouth Three Times A Day 19)  Omeprazole 40 Mg Cpdr (Omeprazole) .[name].. 1 By Mouth Daily 20)  Crestor 20 Mg  Tabs (Rosuvastatin Calcium) .... Take 1 By Mouth Once Daily 21)  Imitrex 100 Mg Tabs (Sumatriptan Succinate) .... As Needed 22)  Cardizem La 240 Mg Xr24h-Tab (Diltiazem Hcl Coated Beads) .... One By Mouth Daily 23)  Levothroid 112 Mcg Tabs (Levothyroxine Sodium) .... Take 1 By Mouth Once Daily 24)  Albuterol Sulfate (2.5 Mg/53m) 0.083% Nebu (Albuterol Sulfate) .... Four Times A Day Via Nebulizer As Needed 25)  Tamiflu 75 Mg Caps (Oseltamivir Phosphate) ..[name]. 1 Twice Daily 26)  Hydrocodone-Homatropine 5-1.5 Mg/566mSyrp  (Hydrocodone-Homatropine) ...[name] 1 Teaspoon Four Times A Day As Needed Cough  Allergies (verified): 1)  ! Sulfa 2)  ! Cipro 3)  ! * Ct Dye 4)  ! Amoxicillin 5)  ! Levaquin 6)  ! * Lyrica 7)  ! * Nucynta  Past History:  Past Medical History: Last updated: 1111/25/2010ERD Gastroparesis Diabetes x 3 years Sleep Apnea (CPAP) COPD Hypertension years Asthma Hyperlipidemia Hyperthyroidism Depression Endomteriosis Fibromyalgia  Past Surgical History: Last updated: 1125-Nov-2010holecystectomy Hysterectomy Sinus surgery x 3 Knee (knee cap) Shoulder (spur) Right hip (Iliotibial band) C-spine Lysis of adhesions- multiple laparoscopies  Family History: Last updated: 11November 25, 2010ather- died MI age 44 Otherwise negative for CAD in first degree relatives..  Social History: Last updated: 1111-25-2010atient states former smoker. - light social Divorced 1st, separated 2nd.  Lives with daughter  Risk Factors: Smoking Status: quit (02/22/2010) Packs/Day: <0.25 (02/22/2010) Passive Smoke Exposure: yes (02/22/2010)  Social History: Packs/Day:  <0.25 Passive Smoke Exposure:  yes  Review of Systems      See HPI       The patient complains of shortness of breath with activity, non-productive cough, chest pain, and weight change.  The patient denies shortness of breath at rest, productive cough, coughing up blood, irregular heartbeats, acid heartburn, indigestion, loss of appetite, abdominal pain, difficulty swallowing, sore throat, tooth/dental problems, headaches, nasal congestion/difficulty breathing through nose, and sneezing.    Vital Signs:  Patient profile:   [age]ear old female Height:      62 inches Weight:      176.38 pounds BMI:     32.38 O2 Sat:      92 % on Room air Pulse rate:   93 / minute BP sitting:   142 / 86  (left arm) Cuff size:   regular  Vitals Entered By: KaClayborne DanaMA (February 22, 2010 9:07 AM)  O2 Flow:  Room air CC: Accute visit-cough-3  weeks; no better and wheezing.   Physical Exam  Additional Exam:  General: A/Ox3; pleasant and cooperative, NAD, obese SKIN: no rash, lesions NODES: no lymphadenopathy HEENT: [place]/AT, EOM- WNL, Conjuctivae- clear, PERRLA- chronically dilated, TM-WNL, Nose- clear, Throat- Mallampati II, hoarse/ strained voice, no stridor, erythema, no drainage or exudate NECK: Supple w/ fair ROM, JVD- none, normal carotid impulses w/o bruits Thyroid-  CHEST: Persistent cough, no rhonchi or wheeze. Exagerated dry cough if she makes a forced exhalation effort. HEART: RRR, no m/g/r heard ABDOMEN: Soft and nl; EXIEP:PIRJnl pulses, no edema  NEURO: Grossly intact to observation, uses 4 legged cane        Pulmonary Function Test Date: 02/22/2010 09:27 AM Gender: Female  Pre-Spirometry FVC    Value: 2.25 L/min   % Pred: 66.30 % FEV1    Value: 1.92 L     Pred: 2.75 L     % Pred: 69.70 % FEV1/FVC  Value: 85.36 %     % Pred: 104.50 %  Impression & Recommendations:  Problem # 1:  ASTHMA (ICD-493.90) There may be an asthma component but her meds are adequate for that. I think she is dealing mostly with stress/ anxiety and with GERD. She does have meds for the GERD. She comes across as "helpless/ hopeless" passive interaction with her mother. I am recommending she meet with the Behavioral Health people for help with depression and anxiety. We did discuss importance of setting limits to get daughter's smoking out of the house.  Medications Added to Medication List This Visit: 1)  Crestor 20 Mg Tabs (Rosuvastatin calcium) .... Take 1 by mouth once daily 2)  Levothroid 112 Mcg Tabs (Levothyroxine sodium) .... Take 1 by mouth once daily  Other Orders: Est. Patient Level IV (33582) Psychology Referral (Psychology)  Patient Instructions: 1)  Please schedule a follow-up appointment in 4 months. 2)  Sample Singulair 10 mg - 1 daily 3)  Keep up with your regular asthma and reflux medicines 4)  Try to get out  of the house and find other things to do with your time. 5)  See [facility] for referral to [place] for help with mood and coping skills     CardioPerfect Spirometry  ID: [id number] Patient: Cheryl [name] DOB: 1966/06/26 Age: 45 Years Old Sex: Female Race: Undetermined Physician: Dr.Clinton Young Height: 62 Weight: 176.38 Smoker: No PPD: <0.25 Has Pacemaker? No Status: Unconfirmed Past Medical History:  GERD Gastroparesis Diabetes x 3 years Sleep Apnea (CPAP) COPD Hypertension years Asthma Hyperlipidemia Hyperthyroidism Depression Endomteriosis Fibromyalgia  Recorded: 02/22/2010 09:27 AM  Parameter  Measured Predicted %Predicted FVC     2.25        3.39        66.30 FEV1     1.92        2.75        69.70 FEV1%   85.36        81.67        104.50 PEF    3.39        6.59        51.50   Interpretation:

## 2010-08-29 NOTE — Assessment & Plan Note (Signed)
Summary: 4 month return/mhh   Primary Provider/Referring Provider:  Morrie Sheldon, MD  CC:  4 month follow up visit-c/o headache and dry non productive cough; very sleepy.[name]  History of Present Illness: 04/09/09- Allergic rhinitis, OSA, DM, GERD............[name] Increased wheeze and hoarseness without nasal congestion or postnasal drip. Occ reflux. Wakes gasping. She had tried cpap in past but not stuck with it. Asks for Layne's and nasal pillows. She notices hoarseness on waking. Hx VCD. Substernal tightness for hours a ta time. Nonproductive. Doesn't know meds- pharmacy being called. ST pos with elevated IgE in past- can't afford vaccine. Out of Proair. Albuterol neb sol not as good as Xopenex.  May 03, 2009-Allergic rhinitis, OSA, DM, GERD.........................daughters here Increased cough and hoarseness. Doesn't think she has a cold- has  been like this since last here. Albuterol neb doesn't help. Says not eating much. Water went down wrong one day. Eats slowly- chokes easily. Has had capsule and endoscopy studies with Dr Ardis Hughs. She hasn't seen him in a long time. We need to autotitrate for cpap pressure recommendation.  August 31, 2009- Allergic rhinitis, OSA, DM, GERD........................[name] hee CPAP fixed at 60. Using CPAP every night and getting through nmost of the night. We discussed comfort and expectations. Got flu vax. Now acutely ill starting yesterday. Dry cough hurts mid chest, chilling. Some diarrhea and nausea.    Current Medications (verified): 1)  Diazepam 5 Mg Tabs (Diazepam) .... Take 1 Tablet By Mouth Four Times A Day 2)  Cymbalta 60 Mg Cpep (Duloxetine Hcl) .[name].. 1 By Mouth Twice  Daily 3)  Trazodone Hcl 100 Mg Tabs (Trazodone Hcl) .... Take 1 Tablet By Mouth Twice A Day 4)  Bystolic 10 Mg Tabs (Nebivolol Hcl) .... Take 1 Tablet By Mouth Once A Day 5)  Furosemide 40 Mg Tabs (Furosemide) .... As Needed 6)  Meclizine Hcl 25 Mg Tabs (Meclizine Hcl) .... As  Needed 7)  Baclofen 20 Mg Tabs (Baclofen) .... Take 1 Tablet By Mouth Four Times A Day 8)  Phenergan 25 Mg/ml Soln (Promethazine Hcl) .... As Needed Nausea 9)  Actoplus Met 15-500 Mg Tabs (Pioglitazone Hcl-Metformin Hcl) .... Take 1 By Mouth Two Times A Day 10)  Gabapentin 800 Mg Tabs (Gabapentin) .... As Needed Three Times A Day 11)  Cpap 10 Laynes 12)  Lantus 100 Unit/ml Soln (Insulin Glargine) .... 20 Units Once Daily 13)  Humalog 100 Unit/ml Soln (Insulin Lispro (Human)) .[name].. 15 Unites Three Times A Day 14)  Proair Hfa 108 (90 Base) Mcg/act Aers (Albuterol Sulfate) .... 2 Puffs Four Times A Day As Needed Rescue 15)  Hydromet 5-1.5 Mg/21m Syrp (Hydrocodone-Homatropine) ..[name]. 1 Teaspoon Four Times A Day As Needed 16)  Lidoderm 5 % Ptch (Lidocaine) ..[name]. 12 Hours On / 12 Hours Off 17)  Symbicort 160-4.5 Mcg/act Aero (Budesonide-Formoterol Fumarate) .... Two Times A Day 18)  Reglan 5 Mg Tabs (Metoclopramide Hcl) .... Four Times A Day 19)  Hydrocodone-Acetaminophen 7.5-325 Mg Tabs (Hydrocodone-Acetaminophen) ..[name]. 1 By Mouth Three Times A Day 20)  Omeprazole 40 Mg Cpdr (Omeprazole) ..[name]. 1 By Mouth Daily 21)  Crestor 10 Mg Tabs (Rosuvastatin Calcium) ..[name]. 1 By Mouth Daily 22)  Imitrex 100 Mg Tabs (Sumatriptan Succinate) .... As Needed 23)  Cardizem La 240 Mg Xr24h-Tab (Diltiazem Hcl Coated Beads) .... One By Mouth Daily 24)  Levothroid 88 Mcg Tabs (Levothyroxine Sodium) .... Take 1 By Mouth Once Daily 25)  Albuterol Sulfate (2.5 Mg/385m 0.083% Nebu (Albuterol Sulfate) .... Four Times A Day  Via Nebulizer As Needed  Allergies (verified): 1)  ! Sulfa 2)  ! Cipro 3)  ! * Ct Dye 4)  ! Amoxicillin 5)  ! Levaquin 6)  ! * Lyrica 7)  ! * Nucynta  Past History:  Past Medical History: Last updated: July 03, 2009 GERD Gastroparesis Diabetes x 3 years Sleep Apnea (CPAP) COPD Hypertension years Asthma Hyperlipidemia Hyperthyroidism Depression Endomteriosis Fibromyalgia  Past Surgical  History: Last updated: 07-03-09 Cholecystectomy Hysterectomy Sinus surgery x 3 Knee (knee cap) Shoulder (spur) Right hip (Iliotibial band) C-spine Lysis of adhesions- multiple laparoscopies  Family History: Last updated: 07/03/09 Father- died MI age 59.  Otherwise negative for CAD in first degree relatives..  Social History: Last updated: 07/03/2009 Patient states former smoker. - light social Divorced 1st, separated 2nd.  Lives with daughter  Risk Factors: Smoking Status: quit (04/09/2009)  Review of Systems      See HPI       The patient complains of anorexia, fever, chest pain, and prolonged cough.  The patient denies weight loss, weight gain, vision loss, decreased hearing, hoarseness, syncope, dyspnea on exertion, peripheral edema, headaches, hemoptysis, abdominal pain, and severe indigestion/heartburn.         diarrhea  Vital Signs:  Patient profile:   45 year old female Height:      62 inches Weight:      184.38 pounds BMI:     33.85 O2 Sat:      96 % on Room air Pulse rate:   92 / minute BP sitting:   124 / 88  (left arm) Cuff size:   regular  Vitals Entered By: Clayborne Dana CMA (August 31, 2009 9:42 AM)  O2 Flow:  Room air  Physical Exam  Additional Exam:  General: A/Ox3; pleasant and cooperative, NAD, obese SKIN: no rash, lesions NODES: no lymphadenopathy HEENT: Dawson/AT, EOM- WNL, Conjuctivae- clear, PERRLA- chronically dilated, TM-WNL, Nose- clear, Throat- Melampatti II, hoarse/ strained voice, no stridor, erythema, no drainage or exudate NECK: Supple w/ fair ROM, JVD- none, normal carotid impulses w/o bruits Thyroid-  CHEST: Persistent cough, no rhonchi or wheeze HEART: RRR, no m/g/r heard ABDOMEN: Soft and nl; RJJ:OACZ, nl pulses, no edema  NEURO: Grossly intact to observation, uses 4 legged cane        Impression & Recommendations:  Problem # 1:  ASTHMA (ICD-493.90) Acute viral syndrome consistent with flu. Early enough to give  tamiflu and cough syrup. We discussed fluids.  Problem # 2:  HYPERSOMNIA WITH SLEEP APNEA UNSPECIFIED (ICD-780.53) We will hold now at 10 cpap.  Medications Added to Medication List This Visit: 1)  Diazepam 5 Mg Tabs (Diazepam) .... Take 1 tablet by mouth four times a day 2)  Actoplus Met 15-500 Mg Tabs (Pioglitazone hcl-metformin hcl) .... Take 1 by mouth two times a day 3)  Gabapentin 800 Mg Tabs (Gabapentin) .... As needed three times a day 4)  Lantus 100 Unit/ml Soln (Insulin glargine) .... 20 units once daily 5)  Humalog 100 Unit/ml Soln (Insulin lispro (human)) .[name].. 15 unites three times a day 6)  Hydrocodone-acetaminophen 7.5-325 Mg Tabs (Hydrocodone-acetaminophen) .[name].. 1 by mouth three times a day 7)  Levothroid 88 Mcg Tabs (Levothyroxine sodium) .... Take 1 by mouth once daily 8)  Albuterol Sulfate (2.5 Mg/29m) 0.083% Nebu (Albuterol sulfate) .... Four times a day via nebulizer as needed 9)  Tamiflu 75 Mg Caps (Oseltamivir phosphate) ..[name]. 1 twice daily 10)  Hydrocodone-homatropine 5-1.5 Mg/577mSyrp (Hydrocodone-homatropine) ...[name] 1 teaspoon four times a day  as needed cough  Other Orders: Est. Patient Level III (41282)  Patient Instructions: 1)  Please schedule a follow-up appointment in 3 months. 2)  Continue cpap at 10. Call for problems. 3)  Scripts for tamiflu and cough syrup. 4)  Kaopectate or something similar over the counter should settle your stomach Prescriptions: HYDROCODONE-HOMATROPINE 5-1.5 MG/5ML SYRP (HYDROCODONE-HOMATROPINE) 1 teaspoon four times a day as needed cough  #200 ml x 0   Entered and Authorized by:   Deneise Lever MD   Signed by:   Deneise Lever MD on 08/31/2009   Method used:   Print then Give to Patient   RxID:   [id number] TAMIFLU 75 MG CAPS (OSELTAMIVIR PHOSPHATE) 1 twice daily  #10 x 0   Entered and Authorized by:   Deneise Lever MD   Signed by:   Deneise Lever MD on 08/31/2009   Method used:   Print then Give to Patient   RxID:    [id number]

## 2010-08-29 NOTE — Assessment & Plan Note (Signed)
Review of gastrointestinal problems: 1. GERD.  EGD, July 2008, showed no esophagitis or Barrett's.  Bravo pH study performed while staying on Zegerid, showed good control of acid while on medicine. 2. Gastroparesis.  EGD, July 2008, showed a large amount of retained food in stomach.  She has diabetes and is on chronic narcotics(Vicodin daily).  These are likely contributing to her gastroparesis.  Gastroparesis in turn can contribute to reflux.      History of Present Illness Visit Type: Initial Consult Primary GI MD: Owens Loffler MD Primary Provider: Morrie Sheldon Requesting Provider: Baird Lyons, MD Chief Complaint: cough & reflux History of Present Illness:      45 year old woman whom I last saw over 2 years ago. At that point I put her on a Reglan trial and she was going to follow up with me 4-5 weeks later. She was lost to followup.  she is still on the Reglan, takes one pill 4 times a day. She does take that helps her "somewhat". She is still bothered by chronic cough, heartburn.  she will take lorcet 1 pill three times a day.  She is planning to decrease the lorcet dosage a bit, from 67m pills down to 7.53mpills a day.  she believes her most recent hemoglobin A1c was 9.3.  Takes omeprazole 4086mill 20-30 min before breakfast and dinner meals.           Current Medications (verified): 1)  Diazepam 5 Mg Tabs (Diazepam) ....[name]1 At Bedtime 2)  Cymbalta 60 Mg Cpep (Duloxetine Hcl) ....[name]1 By Mouth Twice  Daily 3)  Cardizem La 180 Mg Xr24h-Tab (Diltiazem Hcl Coated Beads) ....[name]1 By Mouth Once Daily 4)  Zegerid 40-1100 Mg Caps (Omeprazole-Sodium Bicarbonate) ....[name]1 By Mouth Once Daily 5)  Trazodone Hcl 100 Mg Tabs (Trazodone Hcl) .... Take 1 Tablet By Mouth Twice A Day 6)  Lorcet 10/650 10-650 Mg Tabs (Hydrocodone-Acetaminophen) .... As Needed 7)  Bystolic 10 Mg Tabs (Nebivolol Hcl) .... Take 1 Tablet By Mouth Once A Day 8)  Furosemide 40 Mg Tabs (Furosemide) .... As Needed 9)   Meclizine Hcl 25 Mg Tabs (Meclizine Hcl) .... As Needed 10)  Baclofen 20 Mg Tabs (Baclofen) .... Take 1 Tablet By Mouth Four Times A Day 11)  Phenergan 25 Mg/ml Soln (Promethazine Hcl) .... As Needed Nausea 12)  Actoplus Met 15-850 Mg Tabs (Pioglitazone Hcl-Metformin Hcl) .... Take 1 Tablet By Mouth Once A Day 13)  Gabapentin 600 Mg Tabs (Gabapentin) .... As Needed Up Five Times Daily 14)  Cpap 15)  Lantus 100 Unit/ml Soln (Insulin Glargine) ....[name]100 Units Two Times A Day 16)  Humalog 100 Unit/ml Soln (Insulin Lispro (Human)) .... 40 Units Three Times A Day 17)  Proair Hfa 108 (90 Base) Mcg/act Aers (Albuterol Sulfate) .... 2 Puffs Four Times A Day As Needed Rescue 18)  Hydromet 5-1.5 Mg/5ml84mrp (Hydrocodone-Homatropine) .... [name] Teaspoon Four Times A Day As Needed 19)  Lidoderm 5 % Ptch (Lidocaine) .... [name]2 Hours On / 12 Hours Off 20)  Symbicort 160-4.5 Mcg/act Aero (Budesonide-Formoterol Fumarate) .... Two Times A Day 21)  Reglan 5 Mg Tabs (Metoclopramide Hcl) .... Four Times A Day  Allergies: 1)  ! Sulfa 2)  ! Cipro 3)  ! * Ct Dye 4)  ! Amoxicillin 5)  ! Levaquin 6)  ! * Lyrica 7)  ! * Nucynta  Vital Signs:  Patient profile:   43 y17r old female Height:      6232  inches Weight:      200 pounds BMI:     36.71 Pulse rate:   68 / minute Pulse rhythm:   regular BP sitting:   150 / 90  (right arm) Cuff size:   regular  Vitals Entered By: June McMurray [place] Deborra Medina) (May 29, 2009 10:36 AM)  Physical Exam  Additional Exam:  Constitutional:Obese, chronically ill-appearing, coughing periodically throughout exam Psychiatric: alert and oriented times 3 Abdomen: soft, non-tender, non-distended, normal bowel sounds    Impression & Recommendations:  Problem # 1:  gastroparesis she had a stomach full of food during her endoscopy 2 years ago. I suspect that her chronic daily narcotic usage as well as her diabetes are causing this problem.  Cutting back on narcotics will help, getting  better control of her diabetes will also help. Eating smaller more frequent meals can also be very effective. I advised her to eat 4-5 small meals a day rather than 2-3 larger ones.  Problem # 2:  GERD (ICD-530.81) 48 hour wireless pH probe done 2 years ago shows that while she is on proton pump inhibitor she does not have pathologic amounts of acid refluxing into her esophagus. She she continue her proton pump inhibitor twice daily. I think more important however is her gastroparesis and diabetes, narcotic control, N. smaller more frequent meals will be the best way to help.  Patient Instructions: 1)  You should eat several smaller meals a day, 4-5 meals a day (very small portions) rather than 2-3 bigger meals a day. 2)  Narcotic pain medicines will make your stomach empty slowly, try to take as little as possible. 3)  Try to improve you blood sugar control, this will help stomach empty better. 4)  Fatty meals slow the stomach also, try to eat lower fat usually. 5)  Continue omeprazole twice a day (20-30 min before breakfast and dinner meals). 6)  A copy of this information will be sent to Dr. [name]. 7)  The medication list was reviewed and reconciled.  All changed / newly prescribed medications were explained.  A complete medication list was provided to the patient / caregiver.

## 2010-08-29 NOTE — Progress Notes (Signed)
Summary: waiting on rx   Phone Note Call from Patient Call back at [facility] Phone [phone]   Caller: Patient Call For: young Summary of Call: pt says pharmacy has not yet received response for refill of hydromet syrup. " the drug store" in [place].  Initial call taken by: Cooper Render, CNA,  November 01, 2009 11:39 AM  Follow-up for Phone Call        Spoke with pharmacy.  RX is ready for pick up .  Pt informed. Doroteo Glassman RN  November 01, 2009 11:49 AM

## 2010-09-04 NOTE — Progress Notes (Signed)
Summary: patient needs appt first available is 2/14 pt needs sooner  Phone Note Call from Patient   Caller: Patient Call For: dr young Summary of Call: Patient phoned stated she saw NP [name] under Dr. Laurance Flatten and they wanted her seen by Dr. [name] for her lungs. They did a chest xray but she doesnt have the results her O2 level has been running from 90 t0 93 percent. They gave her a breathing treatment and an antibiotic. His first available is not until 2/14 and patient needs to be seen sooner. She can be reached at [phone] or [phone] Initial call taken by: Ozella Rocks,  August 26, 2010 4:02 PM  Follow-up for Phone Call        Spoke with pt.  She states that she was seen by [name], NP today for increased SOB and dry cough.  She was started on cipro and has been taking neb txs.  She was told needed to see CDY asap.  Nothing available until 09/10/10 and she states can not wait this long. Pls advise thanks! Follow-up by: Tilden Dome,  August 26, 2010 4:44 PM  Additional Follow-up for Phone Call Additional follow up Details #1::        CDY had opening on 08/28/10 so put pt in to see him at 9 am.  Pt aware.  I advised that if gets worse in the meantime, needs to go to ED.  Pt verbalized understanding.  Additional Follow-up by: Tilden Dome,  August 26, 2010 4:51 PM

## 2010-09-04 NOTE — Assessment & Plan Note (Signed)
Summary: follow up   Vital Signs:  Patient profile:   45 year old female Height:      62 inches Weight:      189.25 pounds BMI:     34.74 O2 Sat:      94 % on Room air Pulse rate:   97 / minute BP sitting:   128 / 86  (left arm) Cuff size:   regular  Vitals Entered By: Raymondo Band RN (August 28, 2010 9:13 AM)  O2 Flow:  Room air CC: Follow up per [name], FNP.  Pt c/o chest pressure and tightness, pains in chest when breathing, cough with very little yellow mucus, increased SOB at rest and with activity x 1 wk. Comments Medications reviewed with patient Daytime contact number verified with patient. Raymondo Band RN  August 28, 2010 9:14 AM    Primary Provider/Referring Provider:  Morrie Sheldon, MD  CC:  Follow up per [name], FNP.  Pt c/o chest pressure and tightness, pains in chest when breathing, cough with very little yellow mucus, and increased SOB at rest and with activity x 1 wk..  History of Present Illness: June 24, 2010- Allergic rhinitis, OSA, DM, GERD..........................[name] here Nurse-CC: 4 month follow up visit-wheezing, few asthma attacks, sneezing(using nasal sprays). Sleep- waking up "alot" and other days sleeping all day and not able to wake up well enough. Now on cefdinir for sinusitis. Sneezing all Fall. Had CT sinus at [facility]- "chronic sinusitis".  Mother reports fall 2 weeks ago- fell onto knees and hands when knees buckled. Cheryl [name] now when she stands. Evaluated at [facility]. Anticipate referral to [facility] neurology. Had flu vax. Still lives same house.  Continues CPAP. When too tight she got pressure marks- discussed. Not smoking. Getting divorced- looking at options.   August 28, 2010-Allergic rhinitis, Asthma, OSA, DM, GERD.............[name] here Nurse-CC: Follow up per [name], FNP.  Pt c/o chest pressure and tightness, pains in chest when breathing, cough with very little yellow mucus, increased SOB at rest and with activity x 1  wk. CPAP 10- Usually using it all night every night. Has had to skip this week while ill. Says she has been on abx since October for sinusitis- did CT sinus. Gets better then worse. In last 1-2 weeks, increased cough and wheeze , tussive soreness mid anterior and left lateral chest. CBC and CXR at [name]. Says 2 days ago WBC 14000, given nebs. Now on doxy 239m daily. [facility] and [facility]- uses walker.   Asthma History    Asthma Control Assessment:    Age range: 12+ years    Symptoms: >2 days/week    Nighttime Awakenings: 0-2/month    Interferes w/ normal activity: no limitations    SABA use (not for EIB): >2 days/week    FEV1: 1.92 liters (today)    FEV1 Pred: 2.75 liters (today)    Asthma Control Assessment: Not Well Controlled   Preventive Screening-Counseling & Management  Alcohol-Tobacco     Smoking Status: quit     Packs/Day: <0.25     Year Quit: 2010     Passive Smoke Exposure: yes     Passive Smoke Counseling: to avoid passive smoke exposure  Comments: Daughter smokes in home  Current Medications (verified): 1)  Diazepam 5 Mg Tabs (Diazepam) .... Take 1 Tablet By Mouth Four Times A Day 2)  Cymbalta 60 Mg Cpep (Duloxetine Hcl) ..[name]. 1 By Mouth Twice  Daily 3)  Trazodone  Hcl 100 Mg Tabs (Trazodone Hcl) .... Take 1 Tablet By Mouth Twice A Day 4)  Bystolic 10 Mg Tabs (Nebivolol Hcl) .... Take 1 Tablet By Mouth Once A Day 5)  Furosemide 40 Mg Tabs (Furosemide) .... As Needed 6)  Meclizine Hcl 25 Mg Tabs (Meclizine Hcl) .... As Needed 7)  Baclofen 20 Mg Tabs (Baclofen) .... Take 1 Tablet By Mouth Four Times A Day 8)  Phenergan 25 Mg/ml Soln (Promethazine Hcl) .... As Needed Nausea 9)  Gabapentin 800 Mg Tabs (Gabapentin) .... As Needed Three Times A Day 10)  Cpap 10 Laynes 11)  Lantus 100 Unit/ml Soln (Insulin Glargine) .... 30 Units Once Daily 12)  Humalog 100 Unit/ml Soln (Insulin Lispro (Human)) .[name].. 15 Unites Three Times A Day 13)  Proair Hfa  108 (90 Base) Mcg/act Aers (Albuterol Sulfate) .... 2 Puffs Four Times A Day As Needed Rescue 14)  Symbicort 160-4.5 Mcg/act Aero (Budesonide-Formoterol Fumarate) .... Two Times A Day 15)  Reglan 5 Mg Tabs (Metoclopramide Hcl) .... Four Times A Day 16)  Hydrocodone-Acetaminophen 7.5-325 Mg Tabs (Hydrocodone-Acetaminophen) .[name].. 1 By Mouth Three Times A Day 17)  Omeprazole 40 Mg Cpdr (Omeprazole) .[name].. 1 By Mouth Daily 18)  Crestor 20 Mg Tabs (Rosuvastatin Calcium) .... Take 1 By Mouth Once Daily 19)  Imitrex 100 Mg Tabs (Sumatriptan Succinate) .... As Needed 20)  Cardizem La 240 Mg Xr24h-Tab (Diltiazem Hcl Coated Beads) .... One By Mouth Daily 21)  Levothroid 112 Mcg Tabs (Levothyroxine Sodium) .... Take 1 By Mouth Once Daily 22)  Albuterol Sulfate (2.5 Mg/21m) 0.083% Nebu (Albuterol Sulfate) .... Four Times A Day Via Nebulizer As Needed 23)  Vitamin D3 50000 Unit Caps (Cholecalciferol) .... Take 1 By Mouth Weekly 24)  Doxycycline Hyclate 100 Mg Caps (Doxycycline Hyclate) .... Take 1 Capsule By Mouth Two Times A Day 25)  Metformin Hcl 1000 Mg Tabs (Metformin Hcl) .... Take 1 Tablet By Mouth Two Times A Day 26)  Diabetic Tussin Dm 100-10 Mg/54mLiqd (Dextromethorphan-Guaifenesin) .... As Needed  Allergies (verified): 1)  ! Sulfa 2)  ! Cipro 3)  ! * Ct Dye 4)  ! Amoxicillin 5)  ! Levaquin 6)  ! * Lyrica 7)  ! * Nucynta  Past History:  Past Medical History: Last updated: 112010-11-24ERD Gastroparesis Diabetes x 3 years Sleep Apnea (CPAP) COPD Hypertension years Asthma Hyperlipidemia Hyperthyroidism Depression Endomteriosis Fibromyalgia  Past Surgical History: Last updated: 1111-24-10holecystectomy Hysterectomy Sinus surgery x 3 Knee (knee cap) Shoulder (spur) Right hip (Iliotibial band) C-spine Lysis of adhesions- multiple laparoscopies  Family History: Last updated: 1111/24/2010ather- died MI age 44 Otherwise negative for CAD in first degree relatives..  Social  History: Last updated: 06/24/2010 Patient states former smoker. - light social- quit Divorced 1st, separated 2nd.  Lives with daughter  Risk Factors: Smoking Status: quit (08/28/2010) Packs/Day: <0.25 (08/28/2010) Passive Smoke Exposure: yes (08/28/2010)  Review of Systems      See HPI       The patient complains of shortness of breath with activity, non-productive cough, chest pain, nasal congestion/difficulty breathing through nose, and sneezing.  The patient denies shortness of breath at rest, productive cough, coughing up blood, irregular heartbeats, acid heartburn, indigestion, loss of appetite, weight change, abdominal pain, difficulty swallowing, sore throat, and tooth/dental problems.    Physical Exam  Additional Exam:  General: A/Ox3; pleasant and cooperative, NAD, obese, 94% room air SKIN: no rash, lesions NODES: no lymphadenopathy HEENT: [place]/AT, EOM- WNL, Conjuctivae- clear, PERRLA- chronically dilated,  TM-WNL, Nose- clear but sniffing, Throat- Mallampati II, hoarse/ strained voice, no stridor, erythema, no drainage or exudate NECK: Supple w/ fair ROM, JVD- none, normal carotid impulses w/o bruits Thyroid-  CHEST: trace wheeze, dry cough paroxysms HEART: RRR, no m/g/r heard ABDOMEN: Soft and nl; YKD:XIPJ, nl pulses, no edema  NEURO: Grossly intact to observation, uses rolling walker        Impression & Recommendations:  Problem # 1:  RHINOSINUSITIS, 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 Mother continues to speak of dusty, moldy home, filters get dirty quickly. Daughters friends smoke.  Some of her complaints are allergy/ irritant, some are infectious, and some may be less objective. Now on day 2 of 15 days with two times a day doxycycline.  We will get the report of her CT sinus.   Problem # 2:  HYPERSOMNIA WITH SLEEP APNEA UNSPECIFIED (ICD-780.53)  Generally compliant with CPAP and good control.   Problem # 3:  ASTHMA  (ICD-493.90) Persistent asthmatic bronchitis. CXR images reviewed by me from 08/26/10 clear and normal, supporting impression of bronchitis.  Using rescue inhaler 1-2 daily, Symbicort with good mouth care, and her albuterol neb 1-2 times daily.  Plan- add ipratropium to neb med.  Hopefully family will be able to help her move out of the home they blame for all this.  Discussed her occasional use of hydrocodone before giving hydromet script.   Medications Added to Medication List This Visit: 1)  Lantus 100 Unit/ml Soln (Insulin glargine) .... 30 units once daily 2)  Ipratropium-albuterol 0.5-2.5 (3) Mg/1m Soln (Ipratropium-albuterol) ..[name]. 1 neb four times a day as needed 3)  Doxycycline Hyclate 100 Mg Caps (Doxycycline hyclate) .... Take 1 capsule by mouth two times a day 4)  Metformin Hcl 1000 Mg Tabs (Metformin hcl) .... Take 1 tablet by mouth two times a day 5)  Diabetic Tussin Dm 100-10 Mg/5373mLiqd (Dextromethorphan-guaifenesin) .... As needed 6)  Hydromet 5-1.5 Mg/73m26myrp (Hydrocodone-homatropine) ....[name]1 teaspoon three times a day as needed cough  Other Orders: Est. Patient Level IV ([id number] Instructions: 1)  Please schedule a follow-up appointment in 4 months. 2)  Finish the doxycycline 3)  script hydromet cough syrup- uses sparingly 4)  We will ask Dr MooLaurance Flattenr report of your sinus CT 5)  [facility]bul[facility] combination albuterol and ipratropium 6)  cc Dr MooLaurance Flatten    Prescriptions: HYDROMET 5-1.5 MG/5ML SYRP (HYDROCODONE-HOMATROPINE) 1 teaspoon three times a day as needed cough  #200 ml x 0   Entered and Authorized by:   CliDeneise Lever   Signed by:   CliDeneise Lever on 08/28/2010   Method used:   Print then Give to Patient   RxID:  :   [id number]-ALBUTEROL 0.5-2.5 (3) MG/3ML SOLN (IPRATROPIUM-ALBUTEROL) 1 neb four times a day as needed  #25 x prn   Entered and Authorized by:   CliDeneise Lever   Signed by:   CliDeneise Lever on 08/28/2010   Method used:   Electronically to        [facility]       [address]  [id number]    Ph: [phone]    Fax: [phone]:   [id number] Orders Added: 1)  Est. Patient Level  IV [15973]   Immunization History:  Influenza Immunization History:    Influenza:  historical (04/27/2010)   Immunization History:  Influenza Immunization History:    Influenza:  Historical (04/27/2010)

## 2010-09-04 NOTE — Assessment & Plan Note (Signed)
Summary: cough and SOB//lmr   Allergies: 1)  ! Sulfa 2)  ! Cipro 3)  ! * Ct Dye 4)  ! Amoxicillin 5)  ! Levaquin 6)  ! * Lyrica 7)  ! * Nucynta

## 2010-10-15 ENCOUNTER — Encounter: Payer: Self-pay | Admitting: Internal Medicine

## 2010-10-22 ENCOUNTER — Ambulatory Visit: Payer: Self-pay | Admitting: Internal Medicine

## 2010-10-23 ENCOUNTER — Encounter: Payer: Self-pay | Admitting: Nurse Practitioner

## 2010-10-23 DIAGNOSIS — IMO0002 Reserved for concepts with insufficient information to code with codable children: Secondary | ICD-10-CM

## 2010-10-23 DIAGNOSIS — R609 Edema, unspecified: Secondary | ICD-10-CM

## 2010-10-23 DIAGNOSIS — M1480 Arthropathies in other specified diseases classified elsewhere, unspecified site: Secondary | ICD-10-CM

## 2010-10-23 DIAGNOSIS — R7611 Nonspecific reaction to tuberculin skin test without active tuberculosis: Secondary | ICD-10-CM | POA: Insufficient documentation

## 2010-10-23 DIAGNOSIS — K760 Fatty (change of) liver, not elsewhere classified: Secondary | ICD-10-CM

## 2010-10-23 DIAGNOSIS — K219 Gastro-esophageal reflux disease without esophagitis: Secondary | ICD-10-CM

## 2010-10-23 DIAGNOSIS — J383 Other diseases of vocal cords: Secondary | ICD-10-CM

## 2010-10-23 DIAGNOSIS — G43909 Migraine, unspecified, not intractable, without status migrainosus: Secondary | ICD-10-CM

## 2010-10-23 DIAGNOSIS — B343 Parvovirus infection, unspecified: Secondary | ICD-10-CM

## 2010-10-23 DIAGNOSIS — E559 Vitamin D deficiency, unspecified: Secondary | ICD-10-CM | POA: Insufficient documentation

## 2010-11-05 LAB — GLUCOSE, CAPILLARY: Glucose-Capillary: 244 mg/dL — ABNORMAL HIGH (ref 70–99)

## 2010-11-07 LAB — GLUCOSE, CAPILLARY
Glucose-Capillary: 100 mg/dL — ABNORMAL HIGH (ref 70–99)
Glucose-Capillary: 110 mg/dL — ABNORMAL HIGH (ref 70–99)
Glucose-Capillary: 124 mg/dL — ABNORMAL HIGH (ref 70–99)
Glucose-Capillary: 125 mg/dL — ABNORMAL HIGH (ref 70–99)
Glucose-Capillary: 130 mg/dL — ABNORMAL HIGH (ref 70–99)
Glucose-Capillary: 135 mg/dL — ABNORMAL HIGH (ref 70–99)
Glucose-Capillary: 138 mg/dL — ABNORMAL HIGH (ref 70–99)
Glucose-Capillary: 140 mg/dL — ABNORMAL HIGH (ref 70–99)
Glucose-Capillary: 150 mg/dL — ABNORMAL HIGH (ref 70–99)
Glucose-Capillary: 153 mg/dL — ABNORMAL HIGH (ref 70–99)
Glucose-Capillary: 166 mg/dL — ABNORMAL HIGH (ref 70–99)
Glucose-Capillary: 168 mg/dL — ABNORMAL HIGH (ref 70–99)
Glucose-Capillary: 172 mg/dL — ABNORMAL HIGH (ref 70–99)
Glucose-Capillary: 175 mg/dL — ABNORMAL HIGH (ref 70–99)
Glucose-Capillary: 190 mg/dL — ABNORMAL HIGH (ref 70–99)
Glucose-Capillary: 193 mg/dL — ABNORMAL HIGH (ref 70–99)
Glucose-Capillary: 204 mg/dL — ABNORMAL HIGH (ref 70–99)
Glucose-Capillary: 216 mg/dL — ABNORMAL HIGH (ref 70–99)
Glucose-Capillary: 234 mg/dL — ABNORMAL HIGH (ref 70–99)
Glucose-Capillary: 245 mg/dL — ABNORMAL HIGH (ref 70–99)
Glucose-Capillary: 333 mg/dL — ABNORMAL HIGH (ref 70–99)
Glucose-Capillary: 67 mg/dL — ABNORMAL LOW (ref 70–99)
Glucose-Capillary: 79 mg/dL (ref 70–99)
Glucose-Capillary: 86 mg/dL (ref 70–99)
Glucose-Capillary: 86 mg/dL (ref 70–99)
Glucose-Capillary: 106 mg/dL — ABNORMAL HIGH (ref 70–99)
Glucose-Capillary: 117 mg/dL — ABNORMAL HIGH (ref 70–99)
Glucose-Capillary: 122 mg/dL — ABNORMAL HIGH (ref 70–99)
Glucose-Capillary: 122 mg/dL — ABNORMAL HIGH (ref 70–99)
Glucose-Capillary: 125 mg/dL — ABNORMAL HIGH (ref 70–99)
Glucose-Capillary: 147 mg/dL — ABNORMAL HIGH (ref 70–99)
Glucose-Capillary: 154 mg/dL — ABNORMAL HIGH (ref 70–99)
Glucose-Capillary: 164 mg/dL — ABNORMAL HIGH (ref 70–99)
Glucose-Capillary: 174 mg/dL — ABNORMAL HIGH (ref 70–99)
Glucose-Capillary: 180 mg/dL — ABNORMAL HIGH (ref 70–99)
Glucose-Capillary: 183 mg/dL — ABNORMAL HIGH (ref 70–99)
Glucose-Capillary: 188 mg/dL — ABNORMAL HIGH (ref 70–99)
Glucose-Capillary: 210 mg/dL — ABNORMAL HIGH (ref 70–99)
Glucose-Capillary: 222 mg/dL — ABNORMAL HIGH (ref 70–99)
Glucose-Capillary: 267 mg/dL — ABNORMAL HIGH (ref 70–99)

## 2010-11-07 LAB — CBC
HCT: 33.8 % — ABNORMAL LOW (ref 36.0–46.0)
HCT: 34.2 % — ABNORMAL LOW (ref 36.0–46.0)
HCT: 36.7 % (ref 36.0–46.0)
Hemoglobin: 12 g/dL (ref 12.0–15.0)
Hemoglobin: 12.1 g/dL (ref 12.0–15.0)
Hemoglobin: 12.7 g/dL (ref 12.0–15.0)
MCHC: 34.8 g/dL (ref 30.0–36.0)
MCHC: 35.4 g/dL (ref 30.0–36.0)
MCHC: 35.5 g/dL (ref 30.0–36.0)
MCV: 85.6 fL (ref 78.0–100.0)
MCV: 85.8 fL (ref 78.0–100.0)
MCV: 86.2 fL (ref 78.0–100.0)
Platelets: 260 10*3/uL (ref 150–400)
Platelets: 263 10*3/uL (ref 150–400)
Platelets: 296 10*3/uL (ref 150–400)
RBC: 3.93 MIL/uL (ref 3.87–5.11)
RBC: 3.98 MIL/uL (ref 3.87–5.11)
RBC: 4.28 MIL/uL (ref 3.87–5.11)
RDW: 14.6 % (ref 11.5–15.5)
RDW: 14.7 % (ref 11.5–15.5)
RDW: 14.9 % (ref 11.5–15.5)
WBC: 8.7 10*3/uL (ref 4.0–10.5)
WBC: 9.1 10*3/uL (ref 4.0–10.5)
WBC: 9.9 10*3/uL (ref 4.0–10.5)

## 2010-11-07 LAB — BASIC METABOLIC PANEL
BUN: 5 mg/dL — ABNORMAL LOW (ref 6–23)
CO2: 27 mEq/L (ref 19–32)
Calcium: 8.8 mg/dL (ref 8.4–10.5)
Chloride: 107 mEq/L (ref 96–112)
Creatinine, Ser: 0.39 mg/dL — ABNORMAL LOW (ref 0.4–1.2)
GFR calc Af Amer: >60 mL/min (ref >60)
GFR calc non Af Amer: >60 mL/min (ref >60)
Glucose, Bld: 108 mg/dL — ABNORMAL HIGH (ref 70–99)
Potassium: 3.4 mEq/L — ABNORMAL LOW (ref 3.5–5.1)
Sodium: 139 mEq/L (ref 135–145)

## 2010-11-07 LAB — DIFFERENTIAL
Basophils Absolute: 0.1 10*3/uL (ref 0.0–0.1)
Basophils Relative: 1 % (ref 0–1)
Eosinophils Absolute: 0.5 10*3/uL (ref 0.0–0.7)
Eosinophils Relative: 6 % — ABNORMAL HIGH (ref 0–5)
Lymphocytes Relative: 32 % (ref 12–46)
Lymphs Abs: 3.1 10*3/uL (ref 0.7–4.0)
Monocytes Absolute: 0.6 10*3/uL (ref 0.1–1.0)
Monocytes Relative: 6 % (ref 3–12)
Neutro Abs: 5.6 10*3/uL (ref 1.7–7.7)
Neutrophils Relative %: 56 % (ref 43–77)

## 2010-11-07 LAB — COMPREHENSIVE METABOLIC PANEL
ALT: 46 U/L — ABNORMAL HIGH (ref 0–35)
AST: 44 U/L — ABNORMAL HIGH (ref 0–37)
Albumin: 3.8 g/dL (ref 3.5–5.2)
Alkaline Phosphatase: 96 U/L (ref 39–117)
BUN: 7 mg/dL (ref 6–23)
CO2: 24 mEq/L (ref 19–32)
Calcium: 9.3 mg/dL (ref 8.4–10.5)
Chloride: 101 mEq/L (ref 96–112)
Creatinine, Ser: 0.52 mg/dL (ref 0.4–1.2)
GFR calc Af Amer: >60 mL/min (ref >60)
GFR calc non Af Amer: >60 mL/min (ref >60)
Glucose, Bld: 359 mg/dL — ABNORMAL HIGH (ref 70–99)
Potassium: 4.1 mEq/L (ref 3.5–5.1)
Sodium: 134 mEq/L — ABNORMAL LOW (ref 135–145)
Total Bilirubin: 0.6 mg/dL (ref 0.3–1.2)
Total Protein: 7 g/dL (ref 6.0–8.3)

## 2010-11-07 LAB — RAPID URINE DRUG SCREEN, HOSP PERFORMED
Amphetamines: NOT DETECTED
Barbiturates: NOT DETECTED
Benzodiazepines: POSITIVE — AB
Cocaine: NOT DETECTED
Opiates: NOT DETECTED
Tetrahydrocannabinol: NOT DETECTED

## 2010-11-07 LAB — ACETAMINOPHEN LEVEL: Acetaminophen (Tylenol), Serum: <10 ug/mL — ABNORMAL LOW (ref 10–30)

## 2010-11-07 LAB — SALICYLATE LEVEL: Salicylate Lvl: <4 mg/dL (ref 2.8–20.0)

## 2010-11-07 LAB — ETHANOL: Alcohol, Ethyl (B): <5 mg/dL (ref 0–10)

## 2010-11-11 ENCOUNTER — Ambulatory Visit (INDEPENDENT_AMBULATORY_CARE_PROVIDER_SITE_OTHER): Payer: Medicare Other | Admitting: Adult Health

## 2010-11-11 ENCOUNTER — Ambulatory Visit: Payer: Self-pay | Admitting: Internal Medicine

## 2010-11-11 ENCOUNTER — Encounter: Payer: Self-pay | Admitting: Adult Health

## 2010-11-11 VITALS — BP 118/64 | HR 92 | Temp 97.5°F | Ht 62.0 in | Wt 200.8 lb

## 2010-11-11 DIAGNOSIS — J328 Other chronic sinusitis: Secondary | ICD-10-CM

## 2010-11-11 MED ORDER — CEFDINIR 300 MG PO CAPS: 300.0000 mg | ORAL_CAPSULE | Freq: Two times a day (BID) | ORAL | Status: AC

## 2010-11-11 NOTE — Assessment & Plan Note (Addendum)
Acute flare with associated asthma triggers Plan:  Omnicef 370m Twice daily  For 10 days with food Mucinex DM Twice daily   Saline nasal rinses As needed   Fluids and rest  Please contact office for sooner follow up if symptoms do not improve or worsen or seek emergency care   follow up Dr. YAnnamaria Boots As planned and As needed

## 2010-11-11 NOTE — Patient Instructions (Signed)
Omnicef 368m Twice daily  For 10 days with food Mucinex DM Twice daily   Saline nasal rinses As needed   Fluids and rest  Please contact office for sooner follow up if symptoms do not improve or worsen or seek emergency care   follow up Dr. YAnnamaria Boots As planned and As needed

## 2010-11-11 NOTE — Progress Notes (Signed)
Subjective:    Patient ID: Cheryl [name], female    DOB: [birth year], 45 y.o.   MRN: [id number]  HPI June 24, 2010- Allergic rhinitis, OSA, DM, GERD..........................[name] here  Nurse-CC: 4 month follow up visit-wheezing, few asthma attacks, sneezing(using nasal sprays). Sleep- waking up "alot" and other days sleeping all day and not able to wake up well enough.  Now on cefdinir for sinusitis. Sneezing all Fall. Had CT sinus at [facility]- "chronic sinusitis".  Mother reports fall 2 weeks ago- fell onto knees and hands when knees buckled. Cheryl [name] now when she stands. Evaluated at [facility]. Anticipate referral to [facility] neurology. Had flu vax. Still lives same house.  Continues CPAP. When too tight she got pressure marks- discussed. Not smoking. Getting divorced- looking at options.   August 28, 2010-Allergic rhinitis, Asthma, OSA, DM, GERD.............[name] here  Nurse-CC: Follow up per [name], FNP. Pt c/o chest pressure and tightness, pains in chest when breathing, cough with very little yellow mucus, increased SOB at rest and with activity x 1 wk.  CPAP 10- Usually using it all night every night. Has had to skip this week while ill.  Says she has been on abx since October for sinusitis- did CT sinus. Gets better then worse. In last 1-2 weeks, increased cough and wheeze , tussive soreness mid anterior and left lateral chest. CBC and CXR at [name]. Says 2 days ago WBC 14000, given nebs. Now on doxy 239m daily.  [facility]- [facility] walk[facility].   11/11/2010 Acute OV-Walk in  Complains of increased SOB, wheezing, dry cough, chest congestion, low grade temp x2days. Has a lot of sinus drainage, cough, post nasal drip and wheezing. Wears CPAP at night, no interference. Teeth hurt at  at times. Has fatigue and no energy.  Has been taking zyrtec without much help. Very dry in sinuses. Frontal headache.     Review of Systems Constitutional:   No  weight loss,  night sweats,  , chills,   HEENT:  No  Difficulty swallowing,  Tooth/dental problems, or  Sore throat,                + sneezing, itching, ear ache, nasal congestion, post nasal drip,   CV:  No chest pain,  Orthopnea, PND, swelling in lower extremities, anasarca, dizziness, palpitations, syncope.   GI  No heartburn, indigestion, abdominal pain, nausea, vomiting, diarrhea, change in bowel habits, loss of appetite, bloody stools.   Resp:    No excess mucus, no productive cough,   No chest wall deformity  Skin: no rash or lesions.  GU: no dysuria, change in color of urine, no urgency or frequency.  No flank pain, no hematuria   MS:  No joint   swelling.    Psych:  No change in mood or affect.    No memory loss.    Objective:   Physical Exam GEN: A/Ox3; pleasant , NAD, well nourished   HEENT:  [place]/AT,  EACs-clear, TMs-wnl, NOSE-clear mucosa , THROAT-clear, no lesions, max sinus tenderness   NECK:  Supple w/ fair ROM; no JVD; normal carotid impulses w/o bruits; no thyromegaly or nodules palpated; no lymphadenopathy.  RESP  Coarse BS w/ no wheezing   CARD:  RRR, no m/r/g  , no peripheral edema, pulses intact, no cyanosis or clubbing.  GI:   Soft & nt; nml bowel sounds; no organomegaly or masses detected.  Musco: Warm bil, no deformities or joint swelling noted.   Neuro:  alert, no focal deficits noted.    Skin: Warm, no lesions or rashes         Assessment & Plan:

## 2010-12-10 NOTE — Assessment & Plan Note (Signed)
[facility]                         GASTROENTEROLOGY OFFICE NOTE   [name]                     MRN:          [id number]  DATE:03/09/2007                            DOB:          1965-08-25    PRIMARY CARE PHYSICIAN:  [name], M.D.   PULMONOLOGIST:  Kasandra Knudsen. [name], MD, FCCP, FACP.   GASTROENTEROLOGY PROBLEM LIST:  1. GERD.  EGD, July 2008, showed no esophagitis or Barrett's.  [place]      pH study performed while staying on Zegerid, showed good control of      acid while on medicine.  2. Gastroparesis.  EGD, July 2008, showed a large amount of retained      food in stomach.  She has diabetes and is on chronic narcotics      (Vicodin daily).  These are likely contributing to her      gastroparesis.  Gastroparesis in turn can contribute to reflux.   INTERVAL HISTORY:  Since I last saw Renee, the results of her pH study  have shown that while staying on PPI once daily her acid control is  quite good.  She did, however, have a lot of food in her stomach and  that raised the very real possibility that she has underlying  gastroparesis.  This is probably from her chronic narcotic usage as well  as underlying diabetic gastroparesis.   CURRENT MEDICATIONS:  1. Diazepam.  2. Cymbalta.  3. Cardizem.  4. Zegerid 40 mg once daily.  5. CPAP.  [place].  7. Vicodin 10/325.  8. Tussionex as needed.  9. Flexeril t.i.d.   PHYSICAL EXAMINATION:  VITAL SIGNS:  Weight 220 pounds which is up 8  pounds since her last visit, blood pressure 130/80, pulse 88.  CONSTITUTIONAL:  Chronically ill-appearing, although obese.  NEUROLOGIC:  Alert and oriented x3.  ABDOMEN:  Soft, nontender, nondistended.  Normal bowel sounds.   ASSESSMENT:  A 45 year old woman with gastroesophageal reflux disease,  gastroparesis.   PLAN:  She will stay on the proton pump inhibitor taken 20-30 minutes  prior to her breakfast meal on a daily basis.  This seems to  be  controlling her acid fairly well based on a pH study recently.  Gastroparesis can definitely contribute to GERD, and so I am calling in  a prescription for Reglan.  She will take 5 mg q.a.c. and h.s.  She will  return to see me in 6-8 weeks and  sooner if needed.  She also was asking about generic forms of Zegerid  which there are none but there are other generic proton pump inhibitors,  which I will call in to her pharmacy now.     Milus Banister, MD  Electronically Signed    DPJ/MedQ  DD: 03/09/2007  DT: 03/10/2007  Job #: [id number]   cc:   [name], M.D.  Clinton D. [name], MD, FCCP, FACP

## 2010-12-10 NOTE — Assessment & Plan Note (Signed)
[facility]                             PULMONARY OFFICE NOTE   NAME:[name], [name]                     MRN:          [id number]  DATE:02/15/2007                            DOB:          1966-03-01    PROBLEM:  1. Cough.  2. Allergic rhinitis.  3. Esophageal reflux (Dr. Owens [name]).  4. Obstructive sleep apnea.  5. Diabetes.   HISTORY:  Since I last saw her in June, she says it has not been a good  summer because of health problems.  Breathing has been okay with no  wheeze or cough, occasionally some hoarseness, and postnasal drainage.  She has been staying in a lot.  Saline lavage does seem to help her  nose.  Dr. Ardis [name] had done endoscopy, and now she is pending for what  sounds like a pH probe as she continues Zegerid.  CPAP is being used  regularly now at a pressure she states is 2 cm of water pressure through  [employer], and she says that is comfortable, and seems to work.  She thinks Clarinex is a big help for her cough.  She had been skin test  positive, and has been instructed in environmental precautions for  pertinent inhalant allergens.  Her insurance will not cover allergy  vaccine.  She is wearing a back brace now, and taking Flexeril  regularly.   MEDICATIONS:  1. Diazepam 5 mg.  2. Cymbalta 60 mg.  3. Cardizem LA 180 mg.  4. Zegerid 40 mg.  5. CPAP at 2 CWP.  6. Janumet 50/1000.  7. Optivar eye drops.   She is pretty much off her bronchodilator and respiratory inhalant  medications.   OBJECTIVE:  Weight 210 pounds.  BP 132/84.  Pulse 106.  Room air  saturation 96%.  She is overweight.  Conjunctivae are clear.  Nasal mucosa is not substantially edematous,  and there is little mucus.  Her pharynx is clear.  Voice quality normal  without stridor.  CHEST:  Quiet and clear with breath sounds shallow consistent with body  habitus.  HEART:  Sounds are regular without murmur.  There is no peripheral edema.   IMPRESSION:  1. Allergic rhinitis.  Adequately controlled for now.  2. Multifactorial cough with component from rhinitis and probably from      reflux, which is being evaluated by Gastrointestinal.  3. Obstructive sleep apnea on a very low CPAP pressure, which she      indicates is comfortable and adequate.   PLAN:  1. We reemphasized the importance of weight loss, which would help her      breathing, exercise tolerance, and sleep apnea, as well as her      diabetes.  2. She will continue Clarinex on a p.r.n. basis.  3. Schedule return for allergy assessment later in the fall, earlier      p.r.n., about 4 months.     Cheryl D. [name], MD, Cheryl [name], FACP  Electronically Signed    CDY/MedQ  DD: 02/17/2007  DT: 02/18/2007  Job #: [id number]   cc:   Cheryl [name], M.D.

## 2010-12-10 NOTE — Consult Note (Signed)
NAMEAmbrose Mantle NO.:  [id number]   MEDICAL RECORD NO.:  [id number]          PATIENT TYPE:  INP   LOCATION:  1521                         FACILITY:  [facility]   PHYSICIAN:  Felizardo Hoffmann, M.D.  DATE OF BIRTH:  Sep 01, 1965   DATE OF CONSULTATION:  10/20/2008  DATE OF DISCHARGE:                                 CONSULTATION   ADDENDUM ON CONSULTATION FOLLOWUP:  Please see the initial note.  The  undersigned was called back to the patient's room this morning after the  patient stated to the nurse, I'm about to break down.   When the undersigned reached the room there was mood lability.  She was  sobbing intensely.  She acknowledged that these have been the type of  mood swings that she has been having.  They place her at risk for self-  destructive behavior.  She did agree to come into the psychiatric  hospital for further evaluation and treatment.   The criteria for admission involves that she still has significant  neurovegetative symptoms, she would be at risk for self-neglect at home.  Also, given these severe emotional mood swings, she would be at risk for  self-harm given that they cause her to have impaired judgment and  impulsivity.   We will proceed with the same psychotropic regimen as mentioned in the  previous note and have [name] admitted to a psychiatric ward as  soon as possible.      Felizardo Hoffmann, M.D.  Electronically Signed     JW/MEDQ  D:  10/20/2008  T:  10/20/2008  Job:  [id number]

## 2010-12-10 NOTE — Assessment & Plan Note (Signed)
[facility]                             PULMONARY OFFICE NOTE   NAME:[name], [name]                     MRN:          [id number]  DATE:11/26/2006                            DOB:          April 11, 1966    HISTORY OF PRESENT ILLNESS:  The patient is a 45 year old white female  patient of Dr. Joya [name] with a known history of allergic rhinitis, vocal  cord dysfunction, and gastroesophageal reflux.  The patient presents  today with a one week history of cough, congestion with thick yellow  sputum, nasal congestion, and wheezing.  The patient has recently been  diagnosed with obstructive sleep apnea and is on nocturnal CPAP.  The  patient denies any hemoptysis, orthopnea, PND, or leg swelling. The  patient is using Tussionex with some relief.   PAST MEDICAL HISTORY:  Reviewed with the patient, unfortunately her  chart was unavailable at today's visit.   CURRENT MEDICATIONS:  Reviewed with the patient.   PHYSICAL EXAMINATION:  GENERAL:  The patient is a pleasant female in no  acute distress.  VITAL SIGNS:  She is afebrile with stable vital signs.  O2 saturation is  94% on room air.  HEENT:  Nasal mucosa with some mild erythema.  Nontender sinuses.  Posterior pharynx is clear.  NECK:  Supple without cervical adenopathy.  No JVD.  RESPIRATORY:  Lung sounds reveal coarse rhonchi bilaterally with a few  expiratory wheezes.  The patient has significant upper airway pseudo-  wheezing.  CARDIAC:  Regular rate and rhythm.  ABDOMEN:  Soft and nontender.  EXTREMITIES:  Warm without any edema.   IMPRESSION/PLAN:  Upper airway instability/reactive airway with  underlying vocal cord dysfunction, possibly triggered by an acute upper  respiratory infection.  The patient is to begin Omnicef x5 days, add  Mucinex DM twice daily for cough control.  The patient may use Tussionex  for breakthrough coughing.  She is to use some Zyrtec at bedtime to help  with postnasal  drip symptoms.  And, she will follow back up here with  Dr. Joya [name] as scheduled in two weeks or sooner if needed.     Rexene Edison, NP  Electronically Signed      Burnett Harry Cheryl Gaskins, MD, [facility]  Electronically Signed   TP/MedQ  DD: 11/26/2006  DT: 11/26/2006  Job #: [id number]

## 2010-12-10 NOTE — Assessment & Plan Note (Signed)
[facility] OFFICE NOTE   NAME:[name], Cheryl [name]                     MRN:          [id number]  DATE:12/30/2006                            DOB:          Nov 01, 1965    REFERRING PHYSICIANS:  Dr. Keturah Barre and Dr. Lyda Jester.   PRIMARY CARE PHYSICIAN:  Dr. Redge Gainer.   REASON FOR REFERRAL:  Chronic cough, wheeze, vocal cord issues, question  underlying gastroesophageal reflux disease.   [name] is a pleasant 45 year old woman who has had many years of  gastroesophageal reflux disease symptoms.  She describes it as acid  regurgitation, a mild burning in her chest.  This has been going on at  least as long as 2005.  She had upper endoscopy by Dr. Carol Ada,  October 2005, that was essentially normal except for some mild  enterogastritis.  She was recommended to stay on Protonix twice daily  and Gaviscon as needed.  She has been on that regimen for quite some  time and actually doubled it to four pills a day of Protonix.  She has  had more troubles recently since September of 2008 with wheezing and  vocal cord issues.  She has not had ENT evaluation but has been seen by  a pulmonologist here and had allergy testing as well.   She tells me that her acid regurgitation is worse after certain foods,  specifically [place] foods and spicy foods.  She has a lot of belching.  Laying down definitely makes things worse.  She has no overt dysphagia.   She has also had some intermittent right upper-quadrant pains not  associated with nausea, vomiting or fever.  She had her gallbladder out  about 20 years ago.  I did not obtain any lab tests recently to evaluate  for biliary issues.  She had recent minor rectal bleeding.  It was very  self limited.  Of note, she had a colonoscopy by Dr. Collene Mares in February  2006 that was normal to the terminal ileum.  She had a lot of visceral  hypersensitivity during the  examination.   REVIEW OF SYSTEMS:  Noted for 40-to-50 pound weight gain in the past  year.  Otherwise essentially normal and is available on our nursing  intake sheet.   PAST MEDICAL HISTORY:  1. Morbid obesity.  2. Hypertension.  3. Tachyarrhythmias.  4. Diabetes.  5. Elevated cholesterol.  6. Thyroid disease.  7. Arthritis.  8. Depression.  9. Chronic headaches.  10.Sleep apnea.  11.Status post cholecystectomy in 1989.  12.Status post hysterectomy.  13.Question of parvovirus several years ago.  14.Multiple orthopedic procedures.  15.Endometriosis.   CURRENT MEDICATIONS:  Diazepam, Cymbalta, multivitamin, Cardizem,  Zegerid 40 mg once daily which she takes about an hour before her  breakfast meal, omega 3 fatty acids, CPAP machine, hydrocodone,  Tussionex.   ALLERGIES:  LYRICA, SULFA, ANTIBIOTICS, CIPRO, CT DYE, SHELLFISH AND  EGGS.   SOCIAL HISTORY:  Married.  Lives with her husband and daughter.  Currently on long-term disability.  Non-smoker, non-drinker.  Drinks one  to three caffeinated beverages daily.  No  peppermints.  Rare chocolate.   FAMILY HISTORY:  No colon cancer in family.   PHYSICAL EXAMINATION:  Five feet two inches, 212 pounds, calculated BMI  of 39, blood pressure 132/98, pulse 74.  CONSTITUTIONAL:  Morbidly obese, otherwise well appearing.  Wearing  sunglasses during this examination.  Somewhat fatigued sounding.  NEUROLOGICAL:  Alert, oriented x3.  EYES:  Extraocular muscles are intact.  MOUTH:  Oropharynx moist and no lesions.  NECK:  Supple.  No lymphadenopathy.  CARDIOVASCULAR:  Heart regular rate and rhythm.  LUNGS:  Clear to auscultation bilaterally.  ABDOMEN:  Soft, nontender, nondistended.  Normal bowel sounds.  EXTREMITIES:  No lower extremity edema.  SKIN:  No rash or lesions on visible extremities.   ASSESSMENT AND PLAN:  A 45 year old woman with likely chronic  gastroesophageal reflux disease.  Question of whether this is causing   some of her pulmonary and vocal cord symptoms, morbid obesity, mild  intermittent rectal bleeding.   First, she has had mild intermittent rectal bleeding.  She has  documented internal hemorrhoids on a colonoscopy 2006.  I do not think  we need to repeat that at this point.  She has intermittent right upper-  quadrant pains.  She did have her gallbladder taken out many years ago  by open cholecystectomy.  I think we should repeat right upper-quadrant  ultrasound as well as labs including a complete metabolic profile, CBC,  to see if she is having any new biliary complications.  I see somewhere  in her intake report that she had some abnormal liver tests.  This may  be from underlying fatty liver disease.   As for her chief complaint, it is not clear that her vocal cord or  pulmonary symptoms are from underlying gastroesophageal reflux disease.  She does have acid reflux.  She drinks caffeinated beverages daily, and  she is quite obese.  Those two components can definitely add to a reflux  problem.  She had esophagogastroduodenoscopy three years ago and did not  see any esophageal disease but I would like to repeat that now to see if  maybe she has more significant acid disease with esophagitis or perhaps  Barrett's esophagus.  If that is negative, I would want to proceed with  an ambulatory pH study done while she is on once daily proton pump  inhibitor to document whether she has breakthrough acid on treatment.     Milus Banister, MD  Electronically Signed    DPJ/MedQ  DD: 12/30/2006  DT: 12/30/2006  Job #: [id number]   cc:   Tarri Fuller D. [name], MD, FCCP, Arnold Joya Gaskins, MD, [facility]  [name], M.D.

## 2010-12-10 NOTE — Consult Note (Signed)
NAMEAmbrose Mantle NO.:  [id number]   MEDICAL RECORD NO.:  [id number]          PATIENT TYPE:  INP   LOCATION:  3704                         FACILITY[facility]  [facility]   PHYSICIAN:  Felizardo Hoffmann, M.D.  DATE OF BIRTH:  [birth year]   DATE OF CONSULTATION:  10/20/2008  DATE OF DISCHARGE:                                 CONSULTATION   Cheryl [name] has greatly improved in her mood.  She does have intact  interests and constructive future goals.  She has benefited from the  support of being in the hospital.  She has had visits from her pastors  at her church.  She still has decreased energy and insomnia.  She is no  longer having any suicidal thoughts.  The last time she had those was on  March 23.  She is not having any thoughts of harming others.  She has no  hallucinations or delusions.   PAST PSYCHIATRIC HISTORY:  Regarding her past psychiatric history, she  had been raised to 120 mg of Cymbalta per day, 2 days prior to admission  after the Cymbalta had been partially effective.   She is socially appropriate and cooperative.   LABORATORY DATA:  Sodium 139, BUN 5, creatinine 0.39, glucose 108, WBC  8.7, hemoglobin 12.1, platelet count 260,000.   PHYSICAL EXAMINATION:  VITAL SIGNS:  Temperature 97.6, pulse 76,  respiratory rate 20, blood pressure 103/70, O2 saturation on room air is  95%.   MENTAL STATUS EXAM:  Cheryl [name] is alert.  She has good eye contact.  Her concentration is within normal limits.  Attention normal.  Affect  mildly constricted at baseline with a broad and appropriate range.  As  the conversation proceeds, she smiles.  Her mood is within normal  limits.  Please see the above.  She is oriented to all spheres.  Her  memory is intact for immediate, recent and remote except for some of the  overdose.  Her speech involves normal rate and prosody. No dysarthria.  The volume is soft.  Thought process logical, coherent, goal-directed.  No looseness of  associations.  Thought content:  No thoughts of harming  herself or others, no delusions or hallucinations.  Insight is intact.  Judgment is intact new   ASSESSMENT:  1. 293.83 mood disorder, not otherwise specified (idiopathic and      general medical factors), depressed, improved.  2. 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  3. 293.84 anxiety disorder, not otherwise specified, rule out post-      traumatic stress disorder.   Cheryl [name] is no longer at risk to harm herself or others.  Mrs.  Cheryl [name] agrees to call emergency services immediately for any thoughts  of harming herself, distress or other psychiatric emergency symptoms.   The undersigned still recommended that Cheryl [name] be admitted to an  inpatient psychiatric unit for the highest intensity psychotherapy as  well as pharmacotherapy.  However, Cheryl [name] declined and she is no  longer committable.  She is highly motivated for an intensive outpatient  program and explains that her daughter can drive her  back and forth.  Mrs [name] can use her crutches well for mobility back and forth to  her car and attending groups.   The indications, alternatives and adverse effects of Cymbalta and  trazodone were reviewed with the patient.  There is a history of partial  response to Cymbalta and she did not have significant time on the dosage  of 120 mg daily.  She understands the above and wants to proceed as  below.   1. Start trazodone at 25-50 mg p.o. at bedtime  p.r.n. insomnia and      the outpatient prescription would be trazodone 50 mg tabs one half      to one p.o. at bedtime p.r.n. insomnia, increasing to 2 tablets at      bedtime maximum for sleep.  May take on a regular basis.  2. Cymbalta 30 mg one p.o. q.a.m. then increase by one p.o. q. 2 days      in a b.i.d. fashion to the target dose of 2 mg p.o. b.i.d.   The undersigned has requested that the social worker facilitate Mrs.   [name] arriving to the [facility] intake department this afternoon to set up  her IOP start date this coming week (IOP stands for intensive  outpatient).   At the IOP she will receive pharmacotherapy management as well as  psychotherapy for coping skills and stress management.      Felizardo Hoffmann, M.D.  Electronically Signed     JW/MEDQ  D:  10/20/2008  T:  10/20/2008  Job:  [id number]

## 2010-12-10 NOTE — Discharge Summary (Signed)
NAMEAmbrose [name] NO.:  [id number]   MEDICAL RECORD NO.:  [id number]          PATIENT TYPE:  INP   LOCATION:  5809                         [facility]AC[facility]   PHYSICIAN:  Leana Gamer, MDDATE OF BIRTH:  1966-03-21   DATE OF ADMISSION:  10/16/2008  DATE OF DISCHARGE:                               DISCHARGE SUMMARY   DATE OF DISCHARGE:  Anticipated October 20, 2008.   DISCHARGE DISPOSITION:  Inpatient behavioral health.   DISCHARGE DIAGNOSES:  1. Failed suicide attempt.  2. Overdose of Valium.  3. Major depressive disorder recurrent.  4. Anxiety disorder.  5. Mood disorder.  6. Hypothyroidism.  7. Diabetes type 2.  8. History of asthma.  9. History of migraine headaches.  10.History of vertigo.  11.Acute sinusitis.   DISCHARGE MEDICATIONS:  Include the following:  1. Neurontin 600 mg p.o. daily.  2. Albuterol MDI 2 puffs p.o. q.8 h p.r.n.  3. Endocet 10/325 one tablet p.o. q.i.d. p.r.n.  4. Treximet 85/500 on p.r.n. basis for migraine.  5. Meclizine 25 mg p.o. q.i.d. p.r.n.  6. Levothyroxine 100 mcg p.o. daily.  7. Flexeril 10 mg p.o. daily.  8. Zegerid 40 mg p.o. daily.  9. Diltiazem 180 mg p.o. daily.  10.Reglan 5 mg p.o. t.i.d.  11.Bystolic 5 mg p.o. daily.  12.Promethazine 25 mg p.o. q.8 hours p.r.n.  13.Nasonex spray 1 spray to each nostril daily.  14.Lantus 100 units subcu b.i.d.  15.Humalog 30 units subcu a.c. meals.  16.Xopenex nebulized on as needed basis.  17.Cymbalta 30 mg p.o. daily.  18.Trazodone 25-50 mg p.o. q.h.s. p.r.n. insomnia.  19.Amoxicillin 500 mg p.o. t.i.d. x5 days.   CONSULTANTS:  Dr. Rhona [name], psychiatry.   PROCEDURES:  None.   DIAGNOSTIC STUDIES:  None.   CODE STATUS:  Full code.   PRIMARY CARE PHYSICIAN:  The patient sees Cheryl [name] at [place].   CHIEF COMPLAINT:  Altered mental status and unresponsiveness.   HISTORY OF PRESENT ILLNESS:  For the details of present illness  please  refer to the H and P by Dr. Donneta [name] for details of the HPI.   HOSPITAL COURSE:  1. The patient was admitted with altered mental status secondary to an      overdose of Valium.  The patient was given supportive and      observatory care.  She displayed no signs of seizures and the      patient eventually awoke from her somnolence.  The patient admitted      that this was an intentional overdose with the intention of killing      herself.  Psychiatry was consulted who felt that the patient was      not immediately suicidal but could be a threat to herself outside      of the hospital setting.  The patient had several followup sessions      with psychiatry and the end result of the consultations was that      the patient would be discharged to an inpatient behavioral health      unit for further evaluation and  treatment.  The patient had no      further sequelae from the Valium overdose.  2. Migraine headaches.  The patient does have a history of migraine      headaches for which she uses Treximet intermittently.  The patient      has symptoms indicative of an acute sinusitis of the left frontal      sinus.  She was started on amoxicillin for which she has taken 2      days.  We recommend the patient be treated for a total of 7 days      which would make for 5 additional days of treatment.  The patient      was also treated with sumatriptan and ibuprofen which controlled      her migraine pain effectively.  3. Diabetes type 2.  The patient has had good control of her blood      sugars while hospitalized with her usual dose of Lantus and      Humalog.  The patient was continued on her other medications except      for her Cymbalta which was discontinued while hospitalized.  In      addition, sedatives were removed at this time as the patient had an      altered mental status.  However, there is no reason that her      promethazine, Treximet, Endocet and Flexeril cannot continue as  an      outpatient and they will be continued on a p.r.n. basis for this      patient.  The Cymbalta has been changed from 60 mg daily down to 30      mg each day.  Also added trazodone for insomnia.   From a physical standpoint the patient is disabled with chronic pain and  diabetic neuropathy.  However, she is ambulatory with her crutches  without any difficulty.  The plan is for the patient to be transferred  to inpatient behavioral health when a bed becomes available.   The patient has requested that her discharge summary not be sent to  [place] automatically.  I have spoken with  Cheryl [name], her Cheryl [name] who is aware of her current situation and the  patient will obtain a copy for herself and take it in with her when she  goes to see Cheryl [name].  The patient will be responsible for getting her  record over there by her admission.   DIETARY RESTRICTIONS:  The patient should be on a diabetic, heart-  healthy diet.   PHYSICAL RESTRICTIONS:  Activity as tolerated.   FOLLOWUP:  Will be made once the patient is discharged from inpatient  behavioral health.   Total Time  47 minutes      Leana Gamer, MD  Electronically Signed     MAM/MEDQ  D:  10/20/2008  T:  10/20/2008  Job:  [id number]

## 2010-12-10 NOTE — Assessment & Plan Note (Signed)
[facility]                            CARDIOLOGY OFFICE NOTE   Cheryl [name]                     MRN:          [id number]  DATE:12/07/2006                            DOB:          1966-04-12    PRIMARY Sem Mcc[place]:  Dr. Shanon Rosser at [facility].   PULMONOLOGIST:  Dr. Lyda Jester.   INTERVAL HISTORY:  Ms. Haig Prophet is a 45 year old woman with a history of  hypertension, obesity, fibromyalgia, and chronic dyspnea.  She returns  today for a routine followup.   She recently saw Dr. Lyda Jester with a very helpful visit.  He has  diagnosed her with vocal cord dysfunction and not asthmatic bronchitis.  He took her off most of her inhalers and sent her for allergy testing.  She was also found to have sleep apnea and started on CPAP.   She says she feels somewhat better but still feels very fatigued.  She  noticed that she has been wheezing somewhat but no chest pain, no lower  extremity edema.  She does have chronic dyspnea without significant  change.   CURRENT MEDICATIONS:  1. Diazepam 5 mg t.i.d.  2. Cymbalta 60 daily.  3. Cardizem LA 180 daily.  4. Protonix 40 daily.  5. Zegerid 40 daily.  6. Omega-3 fatty acids.   PHYSICAL EXAMINATION:  GENERAL:  She is fatigued-appearing.  She  ambulates out in the clinic slowly.  No respiratory distress.  She does  have some audible upper airway wheezing.  VITAL SIGNS:  Blood pressure is 140/80, heart rate 93, weight 214.  HEENT:  Normal.  CARDIAC:  Regular rate and rhythm.  No murmurs, rubs, or gallops.  LUNGS:  Clear with some upper airway wheezing.  Once again, lung fields  are clear.  There are no rales.  ABDOMEN:  Obese, nontender, nondistended.  There is no  hepatosplenomegaly, no bruits, no masses.  EXTREMITIES:  Warm with no cyanosis, clubbing, or edema.  No rash.  Good  pulses.  NEUROLOGIC:  Flat affect.  She is otherwise alert and oriented x3.  Cranial nerves 2-12  grossly intact and moves all 4 extremities without  difficulty.   EKG shows normal sinus rhythm with a minimally prolonged QT interval  with a QTc of 482 msec.  No ST-T wave abnormalities.   Review of her echocardiogram shows an EF of 50-55% with no significant  valvular abnormalities.  Adenosine Myoview was normal, and EF was 62%.   ASSESSMENT/PLAN:  Chronic dyspnea.  At this point, I agree with Dr.  Joya Gaskins.  It seems like her major issues are vocal cord dysfunction and  sleep apnea and severe deconditioning.  I do not see an obvious cardiac  etiology at this point.  I have asked her to follow up with Dr. Joya Gaskins,  and I also stressed the absolute need to get involved with a  conditioning program, even starting at a very low level.  Otherwise, I  think she may continue to deteriorate.   We will see her back on a p.r.n. basis.     Shaune Pascal. Bensimhon, MD  DRB/MedQ  DD: 12/07/2006  DT: 12/07/2006  Job #: [id number]   cc:   Heriberto Antigua. Prac. Dr. Shanon Rosser, [place]

## 2010-12-10 NOTE — Assessment & Plan Note (Signed)
[place]                             PULMONARY OFFICE NOTE   NAME:LOCKLEAR, Blane Ohara                     MRN:          [id number]  DATE:06/18/2007                            DOB:          Oct 11, 1965    PROBLEMS:  1. Cough.  2. Allergic rhinitis.  3. Esophageal reflux (Dr. Owens Loffler).  4. Gastroparesis.  5. Obstructive sleep apnea.  6. Diabetes.   HISTORY:  She had a viral sounding bronchitis syndrome and using  nebulized Xopenex. She had previously failed Advair and Symbicort. Dr.  Ardis Hughs had seen her in August finding no esophagitis, but significant  retained food commenting that her gastroparesis daily narcotics were  likely to put her at risk for reflux, so he put her on Reglan. She had  no prednisone since February. She complains of wheeze, cough productive  of yellow and blowing some yellow from her nose with a little streak  epistaxis. No fever. She has CPAP set at 2 CWP, when she can. She  takes it off in her sleep and does that more when her breathing is  uncomfortable. We discussed steroid therapy for her current wheeze  recognizing that it will raise her blood sugar temporarily. Her mother  points out that Renee's home is old and moldy, and that Joseph Art  definitely has more head congestion and wheezing at that home than she  does when she is at her mother's. She had been skin test positive, but  insurance will not cover allergy vaccine. We discussed the possibility  that it might allow for Xolair therapy, but need to recheck an IGE level  before we can explore that.   MEDICATIONS:  1. Diazepam 5 mg nightly.  2. Cymbalta 60 mg.  3. Cardizem LA 180 mg.  4. Zegerid 40 mg.  5. CPAP 2 CWP.  6. Janumet 50/500 2 daily.  7. Glumetza 500 mg.  8. Glimepiride 2 mg.  9. Vicodin 10/325 mg p.r.n.  10.Tussionex.  11.Reglan added 5 mg AC and nightly.  12.Optivar drops p.r.n.  13.Flexeril 10 mg t.i.d.   Drug intolerant LYRICA, SULFA,  CIPRO, CT DYES, SHELLFISH, and EGG.   OBJECTIVE:  Weight 222 pounds, blood pressure 124/72, pulse 108, room  air saturation 94%. Inspiratory and expiratory wheeze, slow expiratory  phase, dry cough. She is not using accessory muscles. There is no  strider. Heart sounds are regular without murmur. No edema.   IMPRESSION:  1. Exacerbation of asthma with potential to get worse rapidly. Current      episode is probably viral, but there is a distinct allergic      component to her asthma and rhinitis.  2. Gastroparesis with high risk for reflux exacerbation of asthma.  3. Sleep apnea probably under treated because of CPAP intolerance      which was discussed.   PLAN:  1. She was given information on environmental precautions for her      home.  2. Lab for IGE and CBC with diff.  3. Print information on Xolair for her to begin considering.  4. Depo-Medrol 80 mg IM with steroid  talk.  5. Schedule return 2 months, earlier p.r.n.     Clinton D. [name], MD, [place], [place]  Electronically Signed    CDY/MedQ  DD: 06/19/2007  DT: 06/20/2007  Job #: [id number]   cc:   [name], M.D.

## 2010-12-10 NOTE — Discharge Summary (Signed)
NAMEAmbrose [name] NO.:  [id number]   MEDICAL RECORD NO.:  [id number]          PATIENT TYPE:  IPS   LOCATION:  0302                          FACILITY:  [facility]   PHYSICIAN:  Norm Salt, MD  DATE OF BIRTH:  1966-06-08   DATE OF ADMISSION:  10/20/2008  DATE OF DISCHARGE:  10/24/2008                               DISCHARGE SUMMARY   IDENTIFYING DATA AND REASON FOR ADMISSION:  This was an inpatient  psychiatric admission for Cheryl [name], a 45 year old Caucasian female admitted  for depression.  Her admission followed an overdose of Valium that had  occurred 4 days prior to admission which resulted in a medical hospital  stay for treatment of that overdose.  Please refer to the admission note  for further details pertaining to the symptoms, circumstances, and  history that led to her hospitalization.  She was given an initial Axis  I diagnosis of major depressive disorder, recurrent.   MEDICAL AND LABORATORY:  The patient was medically and physically  assessed by the psychiatric nurse practitioner.  She came to [name] with a  history of hypothyroidism, chronic musculoskeletal pain, insulin-  dependent diabetes mellitus, nasal allergies, and GERD.  She was  medically and physically assessed by the psychiatric nurse practitioner.  She was continued on her usual levothyroxine, Flexeril, Zegerid, Lantus  insulin, Flonase nasal spray, Reglan, albuterol inhaler.  The patient  also had history of migraine headaches.   HOSPITAL COURSE:  The patient was admitted to the adult inpatient  psychiatric service.  She presented as a well-nourished, normally  developed woman who used crutches.  She reported that she had  fibromyalgia, Parvovirus, herniated disks, and spinal stenosis.  She  named several other illnesses including her diabetes mellitus and  neuropathy and chronic fatigue syndrome.  She seemed quite focused on  her various physical illnesses.  She was very pleasant but sad.   She  denied suicidal ideation and verbalized a strong desire for help.   She came to [name] on a regimen of Cymbalta which was continued.  In  addition, she was treated with gabapentin 600 mg q.a.m. and Seroquel 50  mg q.h.s.  She participated in various therapeutic groups and  activities.  She was a good participant in the treatment program.  There  was a family session involving the patient's mother and 47 year old  daughter on the final hospital day in which her treatment and aftercare  needs were discussed at length.  The patient and her family were both in  support of the aftercare plan as follows.   AFTERCARE:  The patient was to follow up at [facility] with an appointment on November 02, 2008, at 1:30 p.m.   DISCHARGE MEDICATIONS:  1. Gabapentin 600 mg q.a.m.  2. Bystolic 5 mg daily.  3. Levothyroxine 100 mcg daily.  4. Cymbalta 60 mg daily.  5. Ampicillin 500 mg t.i.d. through October 26, 2008.  6. Seroquel 50 mg q.h.s.  7. Flexeril 10 mg daily.  8. Zegerid 40 mg daily.  9. Lantus insulin 100 units in morning and at bedtime.  10.Flomax  nasal spray 2 sprays daily in each nostril.  11.Reglan 5 mg t.i.d.   The patient was instructed to continue albuterol inhaler, her usual  medications for migraine headache, and NovoLog insulin as directed by  her medical doctor.  She was directed to resume her usual diltiazem as  directed by her doctor and Act[employer].  The patient was instructed to stop  taking Valium and not to resume Valium or other benzodiazepines without  consulting with her psychiatrist.   DISCHARGE DIAGNOSES:  AXIS I:  Major depressive disorder, recurrent.  AXIS II:  Deferred.  AXIS III:  History of hypothyroidism, chronic musculoskeletal pain,  asthma, seasonal allergies, diabetes mellitus.  AXIS IV:  Stressors, severe.  AXIS V:  Global Assessment of Functioning on discharge 55.      Norm Salt, MD  Electronically Signed     SPB/MEDQ  D:   11/14/2008  T:  11/14/2008  Job:  [id number]
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
## 2010-12-10 NOTE — Assessment & Plan Note (Signed)
[facility]                             PULMONARY OFFICE NOTE   NAME:[name], [name]                     MRN:          [id number]  DATE:01/18/2007                            DOB:          1965/09/02    PROBLEM:  1. Cough.  2. Allergic rhinitis.  3. Esophageal reflux.   HISTORY:  Cheryl [name] is followed by Dr. Joya [name] for Pulmonary, and Dr. Owens [name] for GI.  Cheryl [name] was skin test positive for common inhalant allergens  in May, but does not think Cheryl [name] insurance would cover allergy vaccine.  Now has a cold, and Cheryl [name] thinks is sinusitis after tending for Cheryl [name] child,  who has been sick.  Cheryl [name] recently took an antibiotic.  Cheryl [name] tried Ental  inhaler, but could not tell much.  Zegerid helps Cheryl [name] acid indigestion,  but Cheryl [name] says Pepcid and Prilosec over the counter did not.  Cheryl [name] has some  persistent sense of sinus congestion.  Nasal discharge is yellow, and  there is pressure at the right maxillary area.  Cheryl [name] is also coughing up  some white or trace yellow sputum.  Cheryl [name] has CPAP for Cheryl [name] sleep apnea,  but has not been able to wear it with Cheryl [name] nose congested.  Being  evaluated for elevated liver functions.   MEDICATIONS:  1. Diazepam 5 mg nightly.  2. Cymbalta 60 mg.  3. Cardizem 180 mg.  4. Zegerid 40 mg.  5. CPAP, Cheryl [name] says is set on 2 through [employer].  6. Cheryl [name] is using Optivar eye drops.   Cheryl [name] has not been using a rescue inhaler.   OBJECTIVE:  Weight 205 pounds.  BP 90/58.  Pulse 104.  Room air  saturation 95%.  Wheeze at end expiration.  Cheryl [name] looks somewhat tired.  There is mild  nasal congestion.  Pulse is regular.   IMPRESSION:  Rhinitis, possible rhinosinusitis and bronchitis.  Background of atopic sensitivity and reflux.  Note that Cheryl [name] CBC June 4th  showed 11.6% eosinophils.   PLAN:  1. Sudafed PE.  2. Nasal nebulizer treatment here with Neo-Synephrine.  3. Biaxin 500 mg b.i.d. for 10 days.  4. I refilled Zegerid.  5. Saline nasal lavage.  6.  Schedule return 3 weeks, earlier p.r.n.     Cheryl D. [name], MD, Shade Flood, FACP  Electronically Signed    CDY/MedQ  DD: 01/23/2007  DT: 01/23/2007  Job #: [id number]   cc:   Cheryl [name], M.D.

## 2010-12-10 NOTE — H&P (Signed)
NAMEAmbrose [name] NO.:  [id number]   MEDICAL RECORD NO.:  [id number]          PATIENT TYPE:  EMS   LOCATION:  ED                           FACILITY:  [facility]   PHYSICIAN:  Cheryl Cooter, MD         DATE OF BIRTH:  07-Feb-1966   DATE OF ADMISSION:  02/24/2008  DATE OF DISCHARGE:                              HISTORY & PHYSICAL   PRIMARY CARE DOCTOR:  Cheryl [name].   PULMONOLOGIST:  Cheryl [name].   GASTROENTEROLOGIST:  Cheryl [name].   CARDIOLOGIST:  Cheryl [name].   HISTORY OF PRESENT ILLNESS:  The patient is a 45 year old presents with  a chief complaint of diffuse abdominal pain, nausea with no vomiting.  Abdominal pain accompanied by some diarrhea, but no fevers, no chills,  no cough, no shortness of breath.  No polyuria, polydipsia.  No urinary  frequency, urgency or burning.  The patient's primary care doctor is Cheryl [name].  The patient apparently was seen by Dr. Wilson [name], and it was felt  that the patient really needed inpatient admission status secondary to  abdominal pain, severely elevated blood sugars and abnormal laboratory  values including a triglyceride level of 4492, an abnormal LFTs  including AST, ALT and GGT.  Abdominal pain started 2 days ago and was  getting progressively worse.  She has no recent travel.  No sick  contacts.  No history of jaundice.  Per records, it looked like the  patient was initiated on  Trilipix for hypertriglyceridemia,  triglycerides were 725 on February 03, 2008.  Thereafter, her liver function  tests were evaluated and monitored February 24, 2008.  The patient was sent  here for abnormal LFTs.   PAST MEDICAL HISTORY:  1. Morbid obesity.  2. Hypertension.  3. History of tachyarrhythmias  4. Diabetes type 2.  5. Elevated cholesterol.  6. Thyroid dysfunction.  7. History of chronic headaches.  8. Obstructive sleep apnea.  9. Status post cholecystectomy 1989.  10.Status post  hysterectomy.  11.Questionable parvovirus several years ago.  12.Status post multiple orthopedic procedures.  13.Endometriosis.   HISTORY OF PRESENT ILLNESS:  The patient was evaluated by primary care  physician today per notes, it looks like the patient complained of  anorexia, nausea, right upper quadrant pain that was moderate that has  been getting progressively worse, ongoing for 1-2 weeks, accompanied by  sweaty flushing and diaphoresis.  It was noted that during that  examination, the patient had the right upper quadrant pain.  Given these  findings and a malignant hypertriglyceridemia, the patient was sent to  the emergency room to rule out acute pancreatitis.  Laboratory  abnormalities noted in the office include a triglycerides of 725, total  bilirubin elevation of 1.5, ACT/ALT elevated 63/73, and GGT of 164.  In  the emergency room, the patient was also found to be severely  hyperglycemic.  The patient was started on a Glucometer protocol.   FAMILY HISTORY:  None.   PAST SURGICAL HISTORY:  1. Status post carpal tunnel release.  2. Status post cholecystectomy.  3.  Status post hysterectomy.  4. Status post knee surgery.  5. Status post laparoscopy shoulder surgery.  6. Status post right hip surgery.  7. Status post sinus surgery.  8. Status post neck surgery.   SOCIAL HISTORY:  Denies drugs.  Is a former smoker.   ALLERGIES:  CIPROFLOXACIN, SULFA, IVP DYE, IODINE-CONTAINING SUBSTANCES.   MEDICATIONS:  Numerous and include:  1. Avandia 4 mg 1/2 tablet p.o. daily.  2. Xopenex 1.25 mg p.o. p.r.n.  3. Gabapentin 300 mg t.i.d.  4. Bystolic 5 mg q.h.s.  5. Oxycodone/acetaminophen 5/325 q.i.d.  6. Promethazine 25 mg 1-2 tablets every 6 hours.  7. Meclizine 25 mg p.o. four times a day.  8. Glumetza 500 mg one tablet at peak p.m.  9. ProAir HFA..  10.Cymbalta 60 mg p.o. one at p.m.  11.Zegerid 40 mg/1100 mg 2-4 tablets daily.  12.Janumet 50/1000 mg two a day 1:00 a.m.,  1:00 p.m. for diabetes      control.  13.Diazepam 5 mg 1-3 per day as needed for symptoms.  14.Diltiazem 180 mg once a day.  15.Reglan 5 mg four times a day before after meals.  16.Cyclobenzaprine 10 mg 1-3 times a day for muscle spasms.  17.Levothyroxine 88 mcg one tablet daily.  18.Glimepiride 2 mg one tablet in the morning.  19.Levemir 80 mg in a.m. and p.m.  20.Apidra 15 units before each meal.  21.Vitamin D.  Note, doses are not finalized until reconciliation form filled out.  That is not confirmed.   REVIEW OF SYSTEMS:  Positive for anorexia, decreased appetite, negative  for fevers and chills.  Negative for chest pain, shortness of breath,  cough, dyspnea.  Positive for diarrhea, diffuse abdominal pain, negative  for vomiting.  Positive for nausea, no rashes.  No urinary frequency,  urgency or polyuria, polydipsia positive for excessive thirst.   PHYSICAL EXAMINATION:  GENERAL:  The patient is in no acute distress.  VITAL SIGNS:  Blood pressure is 112/76, pulse 109, respirations 20,  temperature 98.  HEENT:  Normocephalic, atraumatic.  Sclerae anicteric.  NECK:  Supple.  No JVD, no carotid bruits.  PERLA external muscles  intact.  CARDIOVASCULAR:  S1, S2.  Regular rate and rhythm.  No murmurs, rubs,  clicks.  LUNGS:  Clear to auscultation bilaterally.  No rhonchi, rales or  wheezes.  ABDOMEN:  Soft, nontender, nondistended with positive bowel sounds.  No  hepatosplenomegaly.  Obese, no guarding.  EXTREMITIES:  No clubbing, cyanosis or edema.  NEUROLOGICAL:  Patient is alert, oriented x3.  Cranial nerves II-XII  grossly intact.   LABORATORY DATA:  CBC within normal limits.  Glucose 368 now.  Urinalysis greater than 1000 glucose.  Lipase 26.  B-met shows sodium  130 potassium 4.8, chloride 100, CO2 15, BUN 8, creatinine 0.64.   ASSESSMENT/PLAN:  A 45 year old sent here by primary care doctor for  abdominal pain, abnormal liver function tests for status post treatment   for hypertriglyceridemia that was initiated  February 03, 2008, initiated on  Trilipix, and then rechecked on July 30th to see if there are LFT  abnormalities.   ASSESSMENT:  This is a 45 year old sent here by primary care doctor for  abdominal pain, abnormal liver function tests after treatment for  hypertriglyceridemia with Trilipix (fenofibric-acid) and to rule out  acute pancreatitis.  In the emergency room, the patient was found to be  in diabetic ketoacidosis.   1. Diffuse nonspecific abdominal pain, diarrhea with a negative      lipase.  CT scan of the abdomen and pelvis pending.  The patient      status post cholecystectomy.  Will need to rule out      choledocholithiasis.  Will to ahead and proceed with abdominal CT      scan.  Note that it is possible that the culprit in this case is      changes in her medication, including the fenofibric acid and      Janumet both have reported to cause-- symptoms such as diarrhea,      nausea, vomiting and abdominal discomfort.   1. Increases in liver function tests, AST, ALT and alk phos.  Again,      rule out toxicity reaction to fenofibric acid.  As above, rule out      CBD abnormalities, rule out hepatitis A, B, C.  Will hold Tylenol.      Will get a Tylenol level. Likely, there is a component of      nonalcoholic hepatitis as well given her hypertriglyceridemia.      Certainly could be drug-induced as above.   1. Acidosis, likely DKA given her pseudohyponatremia.  Will get lactic      acid level as there are several medications which she is on that      causes lactic acidosis including metformin and Janumet, IV fluid,      DKA protocol.  Monitor lytes.   1. Obstructive sleep apnea.  Continue CPAP, low dose.   1. Chronic gastroesophageal reflux disease and gastroparesis.      Continue Reglan and continue PPI.   1. Chronic vocal cord dysfunction stable.   1. DVT and GI prophylaxis.   The rest of the plans are dependent on her  progress.  Obviously, if her  abnormalities are not resolved, by holding her medications, we may need  to get gastroenterology consultation, and medications I will be holding  is her metformin, Janumet.  I will be holding her oxycodone,  acetaminophen, Tylenol.  I will be holding her Avandia which Avandia can  certainly cause LFT abnormalities and exacerbate symptoms.      Cheryl Cooter, MD  Electronically Signed     RR/MEDQ  D:  02/25/2008  T:  02/25/2008  Job:  [id number]

## 2010-12-10 NOTE — Discharge Summary (Signed)
NAMEReesa [name]              ACCOUNT NO.:  [id number]   MEDICAL RECORD NO.:  [id number]          PATIENT TYPE:  INP   LOCATION:  32                         FACILITY:  [facility]   PHYSICIAN:  Helen Hashimoto, MD    DATE OF BIRTH:  1966-02-08   DATE OF ADMISSION:  02/24/2008  DATE OF DISCHARGE:  02/28/2008                               DISCHARGE SUMMARY   DISCHARGE DIAGNOSES:  Include  1. Diabetic ketoacidosis.  2. Metabolic acidosis secondary to diabetic ketoacidosis that was      resolved.  3. Nausea and vomiting, resolved.  4. Headache.  5. Morbid obesity.  6. Hypertension.  7. Hypertriglyceridemia.  8. Thyroid dysfunction.  9. History of chronic headache.  10.Obstructive sleep apnea.   DISCHARGE MEDICATIONS:  New medications:  Diflucan 100 mg p.o. once  daily for 7 days.   Patient to continue all predmission medications that include:  1. Xopenex 1.25 as needed.  2. Gabapentin 300 mg 3 times daily.  3. Bystolic 5 mg at bedtime.  4. Oxycodone status acetaminophen 5/325 mg 4 times daily as needed.  5. Promethazine 25 mg to take 1 to 2 tablets every 6 hours as needed.  6. Meclizine 25 mg 4 times daily as needed.  7. Glumetza 500 mg at nighttime.  8. ProAir twice daily.  9. Cymbalta 60 mg at night.  10.Zegerid 40 mg/1100 mg 2 to 4 tablets day.  11.Janumet 50/1000 twice daily at 1:00 a.m. and 1 p.m.  12.Diltiazem 180 mg once a day.  13.Reglan 5 mg 4 times daily before meals.  14.Cyclobenzaprine 10 mg one 3 times per day for muscular spasm.  15.Amaryl 2 mg in the morning time.  16.Levemir 80 mg in the morning and 80 mg at night.  17.Apidra 15 units before each meal.  18.Vitamin D   CONSULTATIONS:  None.   PROCEDURES:  None.   FOLLOWUP:  Follow up with primary doctor in 1 to 2 weeks.   RADIOLOGY STUDIES:  1. CT scan of the abdomen without contrast on February 25, 2008, showed      diffuse fatty infiltration of the liver with focal low density      lesion in the left  lobe of the liver around 2.6 cm.  2. CT scan of the pelvis without contrast on February 25, 2008, showed no      acute findings.  3. Abdominal ultrasound on February 25, 2008, showed no lesion in the      liver and it confirmed the fatty infiltration of the liver.   COURSE OF HOSPITALIZATION.:  1. Diabetic ketoacidosis.  This patient was admitted to the hospital      with acute DKA.  She was started on insulin drip by glucose      stabilizer.  Numbers continued to improve.  Patient was      discontinued from the insulin drip according to the protocol.      Through that time, her electrolytes have been monitored and she has      been receiving IV fluids again through the protocol.  When her  insulin drip was discontinued, her bicarb was 22 and her anion gap      was 9.  Patient was switched back to her medications.  Blood sugar      remained in the upper 100s to low 100s.  On the time of discharge,      repeat BMET was done and again it showed a bicarb of 26 and anion      gap of 7.  Patient was advised to restart all her preadmission      medications.  She was also advised to continue to check her CBG at      home and to follow with her primary doctor as soon as possible for      further adjustment in her medications if needed.  2. Metabolic acidosis that was secondary to her diabetic ketoacidosis.      On the time of presentation, her bicarb was 15 and as mentioned      above that was improved to 26 on the time of discharge.  3. Nausea and vomiting.  This is related to her underlying diabetic      ketoacidosis.  As mentioned, initially CAT scan of her abdomen and      pelvis were done and showed questionable lesion in the liver that      has been ruled by ultrasound.  Currently, she is back to her      baseline.  There is no nausea and there is no vomiting at the time      of discharge.  4. Questionable liver lesion that is seen in her initial CT scan of      the abdomen and pelvis and  that has ruled out with ultrasound.  No      further intervention is recommended at this time.  5. Chronic headache and this patient continues to have chronic      headaches nothing more than her usual at home and that has treated      with analgesics.  6. Hypertriglyceridemia.  Her lipid profile was repeated in the      hospital.  Her triglycerides were 1038 compared to 4400 as an      outpatient.  Patient already made good improvement in her      triglycerides.  He was advised to continue following this with her      primary doctor as she has been doing before.  7. Oral thrush.  Patient is started on Diflucan.   DISPOSITION:  Otherwise, other medical conditions remained stable during  the hospital.   TOTAL ASSESSMENT TIME:  40 minutes.      Helen Hashimoto, MD  Electronically Signed     NAE/MEDQ  D:  02/28/2008  T:  02/28/2008  Job:  [id number]
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
## 2010-12-13 NOTE — Op Note (Signed)
NAMEReesa [name]              ACCOUNT NO.:  [id number]   MEDICAL RECORD NO.:  [id number]          PATIENT TYPE:  AMB   LOCATION:  ENDO                         FACILITY:  [facility]   PHYSICIAN:  Tory Emerald. Benson Norway, MD    DATE OF BIRTH:  1966/01/18   DATE OF PROCEDURE:  05/10/2004  DATE OF DISCHARGE:                                 OPERATIVE REPORT   PROCEDURE:  Esophagogastroduodenoscopy.   INDICATIONS:  Epigastric abdominal pain and heartburn.   ENDOSCOPIST:  Tory Emerald. Benson Norway, MD   EQUIPMENT USED:  Olympus adult endoscope.   CONSENT:  Informed consent was obtained from the patient describing the  risks of bleeding, infection, perforation, medication reactions, and the  risk of death, all of which are not exclusive of any other complications  that can occur.   PHYSICAL EXAMINATION:  CARDIAC:  Regular rate and rhythm.  CHEST:  Lungs are clear to auscultation bilaterally.  ABDOMEN:  Obese, soft, tender in the midepigastric region, otherwise normal.  Positive bowel sounds.   MEDICATIONS:  8 mg of Versed IV and 70 mg Demerol IV.   PROCEDURE:  After adequate sedation was achieved, the endoscope was advanced  from the oral cavity into the proximal small bowel under direct  visualization.  Upon withdrawal of the endoscope, no abnormalities were seen  within the duodenum.  In the gastric lumen the patient was noted to have  some mild antral erythema, and two biopsies were taken of this area.  There  is no evidence of hiatal hernia upon retroflexion, no other masses noted in  the cardia region.  Upon withdrawal into the esophagus, there is no evidence  of any esophagitis.  During this procedure gastric secretions were suctioned  and a pH was checked, which was noted to be at 3.  The procedure was then  terminated and no complications were encountered.   PLAN:  1.  Maintain on Protonix 40 mg p.o. b.i.d. taken at least a half hour before      meals.  2.  Start on Gaviscon one to two  tablespoons p.o. q.h.s.  3.  Follow up in the clinic in two weeks.       PDH/MEDQ  D:  05/10/2004  T:  05/10/2004  Job:  [id number]   cc:   [name], M.D.  [address] [place]  [place] [address]  Fax: Maskell. Marin Olp, M.D.  [address]  [place], [place] [address]  Fax: [phone]

## 2010-12-13 NOTE — Op Note (Signed)
NAME:  Cheryl [name]                        ACCOUNT NO.:  [id number]   MEDICAL RECORD NO.:  [id number]                   PATIENT TYPE:  AMB   LOCATION:  DAY                                  FACILITY:  [facility]   PHYSICIAN:  Michelle L. Helane Rima, M.D.            DATE OF BIRTH:  [birth year]   DATE OF PROCEDURE:  09/08/2003  DATE OF DISCHARGE:                                 OPERATIVE REPORT   PREOPERATIVE DIAGNOSES:  1. Pelvic pain.  2. History of endometriosis.   POSTOPERATIVE DIAGNOSES:  1. Pelvic pain.  2. History of endometriosis.  3. Pelvic adhesions.   OPERATION/PROCEDURE:  Diagnostic laparoscopy.   SURGEON:  Michelle L. Helane Rima, M.D.   INTRAOPERATIVE CONSULTATION:  [name] T. Hoxworth, M.D.   ANESTHESIA:  General.   DRAINS:  None.   ESTIMATED BLOOD LOSS:  None.   DESCRIPTION OF PROCEDURE:  The patient was taken to the operating room after  informed consent was obtained.  She was intubated without difficulty and  placed in the lithotomy position.  The abdomen and vagina were prepped and  draped in the usual sterile fashion.  An in-and-out catheter was used to  empty the bladder.  Attention was turned to be abdomen where a small  infraumbilical incision was made.  The Veress needle was inserted easily  with one attempt.  Pneumoperitoneum was achieved without difficulty.  The  Veress needle was removed and a 12 mm trocar was inserted through the same  incision without difficulty.  With introduction of the laparoscope, we  noticed that there was a band of omental tissue just beneath our trocar  sheath and I found an avascular window through this through which placed the  laparoscope.  The patient was then placed in Trendelenburg.  Of note, the  patient had had a hysterectomy and removal of her right ovary.  Upon entry  into the abdominal cavity, I noticed that there was omentum adherent beneath  the infraumbilical site and adherent all along the anterior abdominal wall  where we were not even able to visualize the bladder.  She had small bowel  adherent to the right side of her lower abdomen and pelvis, a small and  large bowel adherent to the left side of her pelvis and lower abdomen as  well as the portion of the left side of the vaginal cuff.  The left ovary  could not be visualized.  These adhesions were thick as well as filmy.  Because of the multiple adhesions, I then requested an intraoperative  general surgery consult.  Dr. Excell Seltzer was kind enough to step in and  inspect everything through the laparoscope.  He felt that the patient needed  lysis of adhesions; however, because she had not been prepped with a bowel  prep and the possibility of laparotomy, which the patient was not prepared  for, he felt this should be done at a later date after he has had  a chance  to counsel the patient extensively.   We then removed the pneumoperitoneum, released it, removed the trocar, the  incision was closed with interrupted sutures of 3-0 Vicryl.  A local was  placed at the incision site.  The patient was then handed over to Dr. Amalia Hailey  who did perform the cystoscopy and that portion of the procedure will be  dictated by him.   I then went out to the waiting area and spoke with the patient's family,  notified them of the intraoperative results.  Our plan is for the patient to  see Dr. Excell Seltzer postoperatively for evaluation of  her abdominal pain to  see if she would like to proceed with an exploratory laparotomy.  This was  discussed in full.  All sponge, lap and instrument counts were correct x2.  The patient did well and I will see her in one week.                                               Michelle L. Helane Rima, M.D.    Nevin Bloodgood  D:  09/08/2003  T:  09/08/2003  Job:  [id number]   cc:   Domingo Pulse, M.D.  [address]  [place] [address]  Fax: [phone]   Nelwyn Salisbury, M.D.  [address]  [address] [place]  [place] [address]  Fax: [phone]   Darene Lamer. Hoxworth, M.D.  [address], [address] [place]  [place] [address]  Fax: [phone]

## 2010-12-13 NOTE — Assessment & Plan Note (Signed)
[facility]                             PULMONARY OFFICE NOTE   [name]                     MRN:          [id number]  DATE:10/01/2006                            DOB:          03/19/1966    [name] is a 45 year old white female who has labeled as having  asthma, bronchitis, and COPD.  She was hospitalized in February in the  [facility], told that she had bronchitis  at that time, she was sent home on home oxygen therapy.  She does have a  history of significant heartburn and acid symptoms.  She has been on  daily Protonix but still breaks through this at times.  She denies  wheezing. Her symptoms, largely that of hoarseness and clearing of the  throat a lot and excess cough.  She does note increased fatigue and  snoring when sleeping.  She has significant postnasal drainage and is  constantly clearing her throat.  She smoked a few cigarettes a day for a  short period of time but has not smoked since 2006.  She had a lot of  passive smoke exposure in the past.  Denies any sore throat, does have  history of palpitations and tachycardia and significant obesity with  weight gain.  She is referred for further evaluation.   PAST HISTORY:  MEDICAL:  1. Hypertension.  2. Heart dysrhythmias.  3. Hypercholesterolemia.  4. Chronic headaches and migraines.   OPERATIVE:  1. Cholecystectomy 1989.  2. Neck surgery 2004.  3. Sinus surgery x3.  4. Hysterectomy 1993.   She took [place] for 9 months for TB prophylaxis.   SURGICAL:  1. Knee surgery.  2. Hip surgery.  3. Right shoulder surgery.  4. Laparoscopy.  5. Carpal tunnel surgery in the right and left.  6. Herniated disk surgery.   OTHER MEDICAL PROBLEMS:  1. Fibromyalgia.  2. Osteoarthralgias.  3. Myalgias.   MEDICATION ALLERGIES:  SULFA, CIPRO, IVP DYE, AND LYRICA.   CURRENT MEDICATIONS:  1. Diazepam 5 mg t.i.d.  2. Cymbalta 60 mg daily.  3.  Cardizem LA 180 mg daily.  4. Flovent 2 sprays b.i.d.  5. Spiriva 220 mcg strength daily.  6. Oxygen 2 liters continuous.  7. Crestor 5 mg daily.  8. Protonix 40 mg daily.   She has apparently been on the Flovent for a period of time and says she  has also been on the Spiriva.   SOCIAL HISTORY:  Noted above.  She has been out of work since March  2007, lives at home with a husband and daughter.   FAMILY HISTORY:  Heart disease in father, maternal grandmother.  Allergies in mother, 2 brothers.  Clotting disorder in mother, uncle on  mother's side.   PHYSICAL EXAMINATION:  This is an obese white female, quite anxious, in  no distress.  Temperature 98, blood pressure 112/80, pulse 114,  saturation 96% on 2-1/2 liters.  When she ambulates around the office on  room air her saturation only falls to 93%.  CHEST:  Showed to be completely clear without evidence of  wheeze,  rhonchi, or rales.  CARDIAC:  Showed a regular rate and rhythm without S3, normal S1, S2.  ABDOMEN:  Soft, nontender.  EXTREMITIES:  Showed no clubbing, edema, or venous disease.  SKIN:  Clear.  NEUROLOGIC:  Intact.  HEENT:  Showed no jugular venous distention, no lymphadenopathy,  oropharynx clear.  NECK:  Supple.   LABORATORY DATA FROM HOSPITALIZATION:  Revealed hemoglobin of 12, white  count of 10,000, sodium 139, potassium 3.9, chloride 101.  Urinalysis  was unremarkable.  Cardiac enzymes were negative.  VQ scan was negative  for pulmonary embolism, venous Dopplers were negative for deep venous  thrombosis.  She was discharged home on oral Zithromax 500 mg for 4  days, prednisone 60 mg a day for 10 days, Protonix twice daily but she  is only taking it once daily, albuterol p.r.n., Spiriva daily, Flovent  as noted above.   IMPRESSION:  This patient does not appear to have chronic obstructive  lung disease based on spirometric values previously obtained, and based  on chest x-ray findings and physical exam  findings.  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  This patient also has significant upper airway irritability.   PLAN:  1. Discontinue Flovent and Spiriva.  2. Discontinue oxygen.  3. Increase Protonix to 40 mg b.i.d.  4. Use the cyclic cough protocol with Tussionex and Tessalon perles.  5. Obtain a sleep study for this patient.   Once the results of all this are available further recommendations will  follow.     Burnett Harry Joya Gaskins, MD, [facility]  Electronically Signed    PEW/MedQ  DD: 10/01/2006  DT: 10/01/2006  Job #: [id number]   cc:   [name], M.D.

## 2010-12-13 NOTE — Op Note (Signed)
NAMEWyvonnia [name]                        ACCOUNT NO.:  [id number]   MEDICAL RECORD NO.:  [id number]                   PATIENT TYPE:  AMB   LOCATION:  DAY                                  FACILITY:  [facility]   PHYSICIAN:  Domingo Pulse, M.D.               DATE OF BIRTH:  May 12, 1966   DATE OF PROCEDURE:  09/08/2003  DATE OF DISCHARGE:  09/08/2003                                 OPERATIVE REPORT   PREOPERATIVE DIAGNOSIS:  Chronic pelvic pain, probable interstitial  cystitis.   POSTOPERATIVE DIAGNOSIS:  Chronic pelvic pain, probable interstitial  cystitis.   PROCEDURE:  1. Cystoscopy.  2. Urethral calibration.  3. Hydrodistention of the bladder.  4. Marcaine and Pyridium instillation.  5. Marcaine and Kenalog injection.   SURGEON:  Domingo Pulse, M.D.   ANESTHESIA:  General.   COMPLICATIONS:  None.   DRAINS:  None.   BRIEF HISTORY:  This 45 year old female has had problems with significant  pelvic pain and is to undergo evaluation with laparoscopy and cystoscopy.  The patient has undergone cystoscopic examination and found to have  significant adhesions and is now to undergo diagnostic cystoscopy to rule  out IC.  The patient understands the risks and benefits of the procedure and  gave informed consent.   DESCRIPTION OF PROCEDURE:  After the successful induction of general  anesthesia, the patient was placed in the dorsal lithotomy position, prepped  with Betadine, draped in the usual sterile fashion.  The urethra was  calibrated to 64 [place] with female urethral sounds.  Examination under  anesthesia revealed no cystocele, rectocele, or enterocele.  The cystoscope  was inserted; the bladder was carefully inspected.  It was free of any tumor  or stones.  Both ureteral orifices were normal in configuration and  location.  No significant glomerulations could be seen.  The bladder  capacity was felt to be unremarkable, and it was felt this was a relatively  normal  bladder.  The patient's problems appear to be more likely due to the  adhesions seen by Dr. Helane Rima, and it is not clear that the patient requires  any real true ongoing therapy for IC.  We did note, however, that  glomerulations were present, so we will have to evaluate how she does  following the procedure in terms of making long-term plans.                                               Domingo Pulse, M.D.    RJE/MEDQ  D:  09/25/2003  T:  09/25/2003  Job:  [id number]

## 2010-12-13 NOTE — H&P (Signed)
NAMEAmbrose [name] NO.:  [id number]   MEDICAL RECORD NO.:  [id number]          PATIENT TYPE:  INP   LOCATION:  3705                         FACILITY:  [facility]   PHYSICIAN:  Sarita Bottom, M.D.       DATE OF BIRTH:  1966-05-28   DATE OF ADMISSION:  09/08/2006  DATE OF DISCHARGE:                              HISTORY & PHYSICAL   CHIEF COMPLAINT:  Fever, malaise, shortness of breath x3 days.   HISTORY OF PRESENT ILLNESS:  The patient is a 45 year old lady with  progressive shortness of breath and wheeze x3 days.  She notes a fever  to 104 degrees Fahrenheit, yellow sputum and wheezing refractory to her  inhaler indications.  She explains that for the past several months her  breathing has been intermittently labored.  She sees Dr. Laverta Baltimore at [facility] family [facility], who recently referred her to Dr. Melvyn Novas at  [facility] for ongoing respiratory difficulty.   PAST MEDICAL HISTORY:  1. Fibromyalgia.  2. Asthma, mild persistent.  3. COPD.  4. Nonspecific immunodeficiency.  5. Fatigue.  6. Parvovirus B19 status post IgG therapy.  7. Palpitations.  8. Essential tremor.  9. Morbid obesity.  10.Hypothyroidism.  11.Depression.  12.Hypertension.  13.Hypercholesterolemia.  14.Endometriosis.  15.Pelvic adhesions status post LOA.  16.PID.  17.Hemorrhoids.  18.Interstitial cystitis.  19.Sinusitis.  20.Chest pain.   PAST SURGICAL HISTORY:  1. Laparoscopic pelvic LOA.  2. Colonoscopy 2006 with small internal hemorrhoids.  3. Iliotibial band release with bursectomy 2002.  4. C5-C6 cervical diskectomy 2004.  5. Cystoscopy 2005.  6. EGD 2005, negative.   FAMILY HISTORY:  Mother is alive at 40 years old.  Father deceased at 24  secondary to acute myocardial infarction with drowning episode.  Has one  brother, 27 years of age, with hypothyroidism.   SOCIAL HISTORY:  The patient is unemployed, lives at home.  She is  married with one child.  She has  been disabled since March 2007.  She  does not drink or smoke.   ALLERGIES:  LYRICA, SULFA, CIPRO, IV DYE, EGGS, SHELLFISH.   MEDICATIONS:  1. Oral contraceptive pills.  2. Valium 5 mg b.i.d.  3. Cymbalta 60 mg daily.  4. Multivitamins.  5. DuoNeb.  6. Cardizem LA.  7. Protonix 40 mg daily.  8. Percocet p.r.n.  9. Xopenex p.r.n.   REVIEW OF SYSTEMS:  Fever, cough, shortness of breath, fatigue, headache  with cough, palpitations, dizziness, diaphoresis.  Denies changes in  vision, neck stiffness, loss of consciousness, syncope, chest pain,  hemoptysis, nausea, vomiting, changes in bowel or bladder, weight loss  or abdominal pain.   PHYSICAL EXAMINATION:  VITALS:  99.3 degrees Fahrenheit max, 97.73  Fahrenheit now, heart rate 123, BP 119/67, respirations 22, SPO2 92-93%  on room air.  GENERAL APPEARANCE:  The patient is awake, alert, pale, diaphoretic,  puny and ill-appearing morbidly obese.  HEENT:  Pupils equally round and react to light.  Extraocular muscles  intact.  No facial tenderness.  No icterus.  Tympanic membranes intact.  NECK:  No lymphadenopathy.  CARDIAC:  Tachycardiac.  No murmur.  Faint heart sounds.  Cap refill  less than 2 seconds.  No JVD, no edema.  LUNGS:  Diffuse rhonchi on expiration.  No rales or consolidation.  Symmetric.  ABDOMEN:  Obese, positive bowel sounds.  No masses or tenderness to  palpation.  EXTREMITIES:  Cool, 2+ pulses.  No calf tenderness or inguinal  tenderness.  NEUROLOGIC:  Reflexes 2+, upper extremity strength 5/5, lower extremity  strength 5/5, no neurologic focality in cranial nerves II-XII or  peripheral sensation.  No tremor.   LABORATORY DATA:  CBC with WBC 11.9, hemoglobin 12.4, platelets 259.  BMET with sodium 134, potassium 4.1, chloride 105, bicarb 25, BUN 8,  creatinine 0.5, glucose 159.  D-dimer elevated at 0.6.  Cardiac enzymes  with troponin I less than 0.05, CK-MB less than 1.   EKG with sinus tachycardia.    Chest x-ray with no acute process.   ASSESSMENT:  The patient is a 45 year old female with shortness of  breath, fever, wheeze x3 days and a quite complicated history.  1. Chronic obstructive pulmonary disease/asthma exacerbation:  Gold      criteria for class II moderate, prednisone 60 mg daily, SPO2 to      keep sats above 89%.  Spiriva daily, albuterol q.4h. and q.2h.      p.r.n. and as the patient is allergic to fluoroquinolones and      sulfa, will treat with Rocephin and azithromycin combination.      Flutter valve to bedside.  The patient was to see Dr. Melvyn Novas      tomorrow.  Will call him to see whether or not he would like to      visit the patient while she is here.  2. Tachycardia:  Elevated D-dimer with history of immobility, morbid      obesity, Well's criteria with a positive D-dimer puts probability      of pulmonary embolus at 36%.  Spiral CT problematic with IV dye      allergy.  VQ scan is going to be the likely test.  Will proceed.      Check TSH and lipid panel.  Will call the patient's cardiologist,      Dr. Haroldine Laws, for further guidance.  3. Fluids, electrolytes, and nutrition, gastrointestinal:  With      tachycardia and decreased p.o. intake reports, the patient might be      dehydrated.  Will bolus with fluids and follow heart rate.      Continue Cardizem LA and Protonix daily with metered dose inhaler.  4. Palpitations:  The patient with recent 2-D echo and stress test per      Dr. Haroldine Laws.  Will give him a call as noted previously to follow      up on this information.  5. Fibromyalgia:  Continue Valium and Cymbalta.  6. Sinusitis history:  The patient without overt complaints of sinus      pain or discharge.  Will follow for symptoms.  7. Disposition:  Per #1.      Sarita Bottom, M.D.     JP/MEDQ  D:  09/09/2006  T:  09/09/2006  Job:  [id number]

## 2010-12-13 NOTE — Discharge Summary (Signed)
NAMEAmbrose [name] NO.:  [id number]   MEDICAL RECORD NO.:  [id number]          PATIENT TYPE:  INP   LOCATION:  3705                         FACILITY:  [facility]   PHYSICIAN:  Carolyn Stare, MDDATE OF BIRTH:  11-21-1965   DATE OF ADMISSION:  09/08/2006  DATE OF DISCHARGE:  09/11/2006                               DISCHARGE SUMMARY   ATTENDING:  Blane Ohara McDiarmid, M.D.   PRINCIPAL DIAGNOSIS:  Asthma and chronic obstructive pulmonary disease  exacerbation.   SECONDARY DIAGNOSES:  1. Fibromyalgia.  2. Nonspecific immunodeficiency.  3. Palpitations/tachycardia.  4. Fatigue.  5. Parvovirus B19 infection, status post IgG therapy.  6. Essential tremor.  7. Morbid obesity.  8. Depression.  9. Hypertension.  10.Endometriosis.  11.Hemorrhoids.  12.Recurrent sinusitis.   ALLERGIES:  LYRICA, SULFA, CIPRO, IV DYE, EGGS, SHELLFISH.   HISTORY OF PRESENT ILLNESS:  Patient is a 45 year old white female with  a history of asthma, COPD, fibromyalgia, hypertension, nonspecific  immunodeficiency who presented to the ED on February 12 with a three day  history of progressive shortness of breath, wheezing, fever, malaise.  Patient noted a fever of 104 degrees Fahrenheit, yellow sputum and  wheezing refractory to her inhaler medications.  Patient explained that  for the past several months her breathing has been intermittently  labored.  Patient sees Dr. Laverta Baltimore at [facility],  who recently referred her to Dr. Melvyn Novas at [facility] for ongoing  respiratory difficulties.  Please see dictated history and physical for  full details.   HOSPITAL COURSE:  1. Shortness of breath/dyspnea.  Patient's chest x-ray on February 12      showed normal chest with no evidence of acute cardiac or pulmonary      process.  Patient's shortness of breath was thought to be due to      asthma/COPD exacerbation; however, patient's d-dimer was elevated      at 0.6.   As a result a V/Q scan was done on February 13 which      showed low probability for PE.  Lower extremity Dopplers done on      February 13 were negative for DVT.  Patient was started on oxygen,      oral prednisone, Spiriva, albuterol, Zithromax and Rocephin.      Patient's dyspnea improved during hospitalization on these      medications.  Rocephin was discontinued on February 14 and Flovent      was added on February 14.  Patient was discharged home in improved      condition on home oxygen, oral prednisone, albuterol, Flovent,      Spiriva.  2. Tachycardia.  Patient was tachycardic during most of      hospitalization; however, heart rate ranged from 70 to 100 on day      of discharge.  TSH was within normal limits.  V/Q scan was negative      for PE.  Cardiac enzymes on day of admission were negative.  EKG on      admission showed only sinus tachycardia.  3. Hypertension.  Patient's hypertension was controlled during  hospitalization on Cardizem LA.  4. Depression/anxiety.  Patient's depression and anxiety was      controlled on Cymbalta and Valium during hospitalization.  5. Fibromyalgia.  Patient's fibromyalgia pain was controlled on      Cymbalta and Valium during hospitalization.  6. Recurrent sinusitis.  Patient was asymptomatic during      hospitalization.  CT scan of sinuses on February 13 showed no      significant sinus disease.   DISCHARGE LABS:  White blood cell count 10.2, hemoglobin 12.1,  hematocrit 33.9, platelets 308.  Sodium 138, potassium 3.9, chloride  101, bicarb 28, BUN 8, creatinine 0.6, glucose 111.  Calcium 8.7.  Hemoglobin A1c 5.3.  TSH 1.431.   DISPOSITION:  Patient will be discharged home today in satisfactory  condition on home health.   FOLLOWUP PLANS AND APPOINTMENTS:  Patient was instructed to arrange  followup with Dr. Christinia Gully of [facility].   DISCHARGE MEDICATIONS:  1. Azithromycin 500 mg p.o. daily for four days.  2.  Prednisone 60 mg p.o. daily for 10 days.  3. Protonix 40 mg p.o. twice daily.  4. Albuterol 90 mcg 2 puffs q.4 h. p.r.n. via spacer.  5. Cymbalta 60 mg p.o. daily.  6. Valium 5 mg p.o. twice daily.  7. Cardizem LA 120 mg p.o. daily.  8. Flovent 220 mcg inhaled twice daily via spacer.  9. Spiriva 18 mcg capsule inhaled daily.  10.Home oxygen.     ______________________________  Mathis Dad, MS    ______________________________  Carolyn Stare, MD    DO/MEDQ  D:  09/11/2006  T:  09/12/2006  Job:  [id number]

## 2010-12-13 NOTE — Assessment & Plan Note (Signed)
[place] OFFICE NOTE   NAME:LOCKLEAR, Blane Ohara                     MRN:          [id number]  DATE:09/25/2006                            DOB:          1966/06/27    PULMONOLOGIST:  Dr. Lyda Jester.   INTERVAL HISTORY:  Cheryl [name] is a 45 year old woman with multiple  medical problems, who I saw 3 weeks ago for evaluation of dyspnea.  At  that time she ambulated around the hall and had some O2 desaturations.  She has subsequently been admitted with asthmatic bronchitis.  She was  treated with antibiotics and steroids, and sent home on home oxygen.  She has also undergone a battery of cardiac tests.  She did have an  echocardiogram which showed a low normal left ventricular ejection  fraction with an EF of 50% to 55%.  There were no significant valvular  abnormalities.  She also underwent adenosine Myoview, showed an EF of  62% with no evidence of ischemia.  She says she does feel better with  the oxygen.  She has also been started on inhalers which are helping.  She denies any chest pain but she does continue to have shortness of  breath.   CURRENT MEDICATIONS:  1. Diazepam 5 mg t.i.d.  2. Cymbalta 60 a day.  3. Cardizem 180 a day.  4. Flovent 220.  5. Spiriva 18 mcg a day.  6. Home O2.   PHYSICAL EXAMINATION:  She looks older than her stated age.  She is  chronically ill-appearing.  She ambulates around the clinic slowly with  just minimal respiratory distress.  Blood pressure is 108/80 with a heart rate of 110, weight is 212.  HEENT:  Sclerae are anicteric, EOMI.  There is no xanthelasma.  Mucous  membranes are moist.  Oropharynx is clear.  NECK:  Supple, there is no JVD.  Carotids are 2+ bilaterally without any  bruits.  There is no lymphadenopathy or thyromegaly.  CARDIAC:  She is mildly tachycardic and regular, no obvious murmur.  LUNGS:  Have poor air movement throughout with no wheezes or rales.  ABDOMEN:  Obese, nontender, nondistended.  No obvious  hepatosplenomegaly, no bruits, no masses, good bowel sounds.  EXTREMITIES:  Warm with no cyanosis, clubbing or edema, no rashes.  NEUROLOGIC:  She is alert and oriented x3.  Cranial nerves II-XII are  intact.  She moves all 4 extremities without difficulty.  Affect is  flattened.   ASSESSMENT AND PLAN:  1. Dyspnea.  I suspect this is mostly related to her chronic asthmatic      bronchitis.  She is scheduled to see Dr. Joya Gaskins in Pulmonary next      week.  I suggested that she perhaps talk to him about pulmonary      rehabilitation.  2. Sinus tachycardia.  I am unsure of the etiology of this, I suspect      it is related to her lung disease.  Echocardiogram and nuclear      study have not showed any significant underlying cardiac pathology.   DISPOSITION:  I will  see her back in several months for routine  followup.     Shaune Pascal. Bensimhon, MD  Electronically Signed    DRB/MedQ  DD: 09/25/2006  DT: 09/26/2006  Job #: [id number]

## 2010-12-13 NOTE — Op Note (Signed)
NAME:  Cheryl [name] NO.:  [id number]   MEDICAL RECORD NO.:  [id number]                   PATIENT TYPE:  INP   LOCATION:  3172                                 FACILITY:  [facility]   PHYSICIAN:  Hosie Spangle, M.D.            DATE OF BIRTH:  1966-04-13   DATE OF PROCEDURE:  03/13/2003  DATE OF DISCHARGE:                                 OPERATIVE REPORT   PREOPERATIVE DIAGNOSIS:  C5-C6 cervical disc herniation, cervical  spondylosis, cervical degenerative disc disease, cervical radiculopathy.   POSTOPERATIVE DIAGNOSIS:  C5-C6 cervical disc herniation, cervical  spondylosis, cervical degenerative disc disease, cervical radiculopathy.   PROCEDURE:  C5-C6 anterior cervical discectomy and arthrodesis with iliac  crest allograft and Trinica cervical plating.   SURGEON:  Hosie Spangle, M.D.   ASSISTANT:   ANESTHESIA:  General endotracheal anesthesia.   INDICATIONS FOR PROCEDURE:  The patient is a 45 year old woman who presented  with right cervical radiculopathy who was found by MRI scan to have C5-C6  cervical disc herniation superimposed upon underlying degenerative disc  disease.  The decision was made to proceed with a single level anterior  cervical discectomy and arthrodesis.   PROCEDURE:  The patient was brought to the operating room and placed under  general endotracheal anesthesia.  The patient was placed in 10 pounds of  halter traction.  The neck was prepped with Betadine soaping solution and  draped in a sterile fashion.  A horizontal incision was made on the left  side of the neck, the line of the incision was infiltrated with local  anesthetic with epinephrine.  The incision was made and carried down through  the subcutaneous tissue.  Bipolar cautery and electrocautery were used to  maintain hemostasis.  Dissection was carried down to an avascular plane with  the sternocleidomastoid muscle and carotid artery laterally and trachea  and  esophagus medially.  The ventral aspect of the vertebral column was  identified and a localizing x-ray was taken and the C5-C6 intervertebral  disc space was identified.  The annulus was incised, the microscope draped  and brought onto the field to provide illumination and visualization and the  remainder of the discectomy was performed using microdissection and  microsurgical technique.  The cartilaginous endplates of the corresponding  vertebra were removed using microcurets as well as the Dawson drill.  Posterior osteophyte overgrowth was removed using the Midas Rex drill as  well as the 2 mm Kerrison punches with the footplate.  We opened the  posterior longitudinal ligament and removed the disc herniation.  We removed  the posterior longitudinal ligament and decompressed the spinal canal and  thecal sac as well as the foramina and nerve roots bilaterally.  Once the  decompression was completed, hemostasis was established with the use of  Gelfoam soaked in thrombin.  We used spacers, selected an appropriate size  graft, selected an 8 mm iliac crest  tricortical allograft that was hydrated  in saline solution and then positioned in the intervertebral disc space and  counter sunk.  We then relaxed and removed the cervical traction and  selected a 26 mm Trinica cervical plate and it was positioned over the  fusion construct and secured to each of the vertebra with a pair of 4.2 by  14 mm screws.  Each screw hole was drilled and tapped and the screw placed.  All four screws were placed in alternating fashion, fully tightened, and  then the locking system secured.  The wound was irrigated with Bacitracin  solution, checked for hemostasis which was established and confirmed, and an  x-ray was taken that showed the plate, grafts, and screws were all in good  position, the alignment was good.  Then, we proceeded with closure once  hemostasis was again confirmed.  The platysma was  reapproximated with  interrupted inverted 2-0 Vicryl suture, the subcutaneous and subcuticular  layer was closed with interrupted inverted 3-0 Vicryl sutures, and the skin  was reapproximated with Dermabond.  The patient tolerated the procedure  well.  Estimated blood loss was 75 mL.  Sponge counts were correct.  Following surgery, the patient was placed in a soft cervical collar,  reversed from the anesthetic, extubated, and transferred to the recovery  room for further care where she was noted to be moving all four extremities  to command.                                               Hosie Spangle, M.D.    RWN/MEDQ  D:  03/13/2003  T:  03/13/2003  Job:  [id number]

## 2010-12-13 NOTE — Op Note (Signed)
NAMEReesa [name]              ACCOUNT NO.:  [id number]   MEDICAL RECORD NO.:  [id number]          PATIENT TYPE:  AMB   LOCATION:  ENDO                         FACILITY:  [facility]   PHYSICIAN:  Nelwyn Salisbury, M.D.  DATE OF BIRTH:  June 25, 1966   DATE OF PROCEDURE:  09/20/2004  DATE OF DISCHARGE:                                 OPERATIVE REPORT   PROCEDURE PERFORMED:  Colonoscopy.   ENDOSCOPIST:  Nelwyn Salisbury, M.D.   INSTRUMENT USED:  Olympus video colonoscope.   INDICATIONS FOR PROCEDURE:  Blood in stool and change in bowel habits in a  45 year old white female.  Rule out inflammatory bowel disease.   PREPROCEDURE PREPARATION:  Informed consent was procured from the patient.  The patient fasted for eight hours prior to the procedure and prepped with a  bottle of magnesium citrate and a gallon of GoLYTELY the night prior to the  procedure.  Risks and benefits of the procedure including a 10% miss rate of  cancer and polyps was discussed with the patient as well.   PREPROCEDURE PHYSICAL:  VITAL SIGNS:  Stable vital signs.  NECK:  Supple.  CHEST:  Clear to auscultation.  CARDIOVASCULAR:  S1 and S2 regular.  ABDOMEN:  Soft with normal bowel sounds.   DESCRIPTION OF PROCEDURE:  The patient was placed in left lateral decubitus  position, sedated with 120 mg of Demerol and 12 mg of Versed in slow  incremental doses.  Once the patient was adequately sedated and maintained  on low flow oxygen and continuous cardiac monitoring, the Olympus video  colonoscope was advanced from the rectum to the cecum.  The appendiceal  orifice and ileocecal valve were clearly visualized and photographed.  The  terminal ileum appeared healthy and without lesions.  Small internal  hemorrhoids were seen on retroflexion.  The patient had some abdominal pain  with insufflation of air into the colon indicating a component of visceral  hypersensitivity consistent with irritable bowel syndrome.   IMPRESSION:  1.  Normal colonoscopy up to the terminal ileum except for small internal      hemorrhoids.  2.  Abdominal discomfort with insufflation of air into the colon indicating      a component of visceral hypersensitivity consistent with irritable bowel      syndrome.   RECOMMENDATIONS:  1.  Continue high fiber diet with liberal fluid intake.  2.  Repeat colonoscopy at age 68 unless the patient has problems in the      interim.  3.  Outpatient follow-up as need arises in the future.      JNM/MEDQ  D:  09/20/2004  T:  09/20/2004  Job:  [id number]   cc:   [name], M.D.  [place]  [place] [address]  Fax: [phone]

## 2010-12-13 NOTE — Assessment & Plan Note (Signed)
[facility]                             PULMONARY OFFICE NOTE   NAME:[name], [name]                     MRN:          [id number]  DATE:12/12/2006                            DOB:          24-Jan-1966    PROBLEM:  Allergy consultation at the kind request of Dr. Joya Gaskins for  this 45 year old woman. Concerned about allergy as the basis for cough  and wheezing.   HISTORY OF PRESENT ILLNESS:  She gives a history of vocal cord  dysfunction, autoimmune disorders, leukocytosis, fibromyalgia,  degenerative disk disease, asthma, tremors, shortness of breath, and  tachycardia. She was evaluated by Dr. Joya Gaskins in March, presenting with  diagnoses of asthma, bronchitis, and COPD. She has been hospitalized by  [facility] in February and diagnosed with bronchitis,  discharged on home oxygen. She gave a history of significant heartburn  at that time. Dr. Joya Gaskins questioned the diagnosis of asthma. She has  tried and come off of home nebulizer with duo-neb, Tussionex, Flovent to  20, Spiriva, and home oxygen. She has also stopped both Xopenex and  albuterol metered inhalers. She says none of them seem to help. She  comes today with her mother, who does much of the talking. They say that  she had allergy testing around age 57 and was on allergy vaccine at that  time. She has had several sinus surgeries, most recently what sounds  like an SMR by Dr. Claria Dice. She complains of hoarseness and cough.  Environmental assessment was done at her home, positive for mildew.  Mother says that the patient is worse if she is shut up in the house and  she has been less able to get our because of her degenerative disk  disease. She has not felt clear since September. Zegerid helps but she  is still aware of reflux symptoms. She had a remote upper endoscopy by  Dr. Collene Mares but asks if she could have GI evaluation here.   MEDICATIONS:  Diazepam 5 mg t.i.d., Cymbalta 60 mg,  multivitamins,  Cardizem LA 180 mg, Zegerid 40, Omega 3, CPAP she thinks is set at 2 for  sleep apnea, hydrocodone with APAP 10/325, Tussionex.   ALLERGIES:  LYRICA, CIPRO, SULFA, CT DYE, SHELLFISH, EGG. She denies  intolerance to Latex or aspirin.   REVIEW OF SYSTEMS:  Dyspnea at rest and with activity, non-productive  cough, occasional tachy-palpitation, acid indigestion, weight gain from  prednisone, headaches and nasal congestion, itching of eyes, back pain,  headaches.   PAST MEDICAL HISTORY:  Childhood allergic rhinitis. Hospitalized  February 2008 with a diagnosis of bronchitis or asthma. Remote treatment  by Dr. 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 She says at some point, apparently that  diagnosis was abandoned.  Elevated cholesterol, chronic migraine,  obstructive sleep apnea with CPAP set at 2. PPD conversion, treated with  9 months of INH several years ago.   PAST SURGICAL HISTORY:  Three nose and sinus procedures. Cervical spine,  gallbladder, splenectomy. Right knee, left shoulder, right hip.  Bilateral carpal tunnel, hysterectomy, laparoscopy x4.  SOCIAL HISTORY:  She smokes 1 or 2 cigarettes a week. Married with a  daughter. She is on long term disability. 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   FAMILY HISTORY:  Allergies, asthma, heart disease, clotting disorders,  cancer. Mother and brother are said to have allergic rhinitis.   ENVIRONMENTAL/ALLERGY HISTORY:  She says triggers include exposure to  cats, seasonal pollens, weather changes, and viral colds. She avoids  house cleaning but is not sure of direct symptom relationship to house  dust or mold exposures. Positive skin test for food evaluation by an ENT  doctor in [place] years ago and told that she was allergic to  everything  but chocolate and she was put on a stone age diet, which she  cannot now define. She thinks that mild on cereal may make her cough.  She is living in an older home. No basement. Crawl space. Two small  dogs. No smokers. Central air conditioning. Gas heat. Carpet was  replaced in February with linoleum and wood. There has been some  relatively unimpressive mold associated with the building by  description.   OBJECTIVE:  VITAL SIGNS:  Weight 198 pounds. Blood pressure 128/74,  pulse regular at 114. Room air saturation 95%.  LUNGS:  Frequent cough and throat clearing. Red throat. There is  distinct wheeze at the level of the throat and upper chest. Bilateral  air flow in the lower lung zones. Does not sound abnormal. I heard no  rales or rhonchi. Quality is minimally hoarse. Nasal airway is not  obstructed but there is some clear mucous bilaterally.  HEART:  Sounds are regular without murmur. She is wearing a back brace  and walking with a cane. No clubbing, cyanosis, or edema. No adenopathy.   SKIN TESTING:  Positive histamine, negative Diluent controls.  Significant positive inter-dermal's particularly for grass and tree  pollens, and dust mite. Results were reviewed with her and her mother.   IMPRESSION:  1. Cough is associated at least with a contribution from significant      allergic rhinitis. Possible tracheobronchitis and reflux. There      seems to be a significant atopic component. I have given print      information and we have discussed environmental controls and      avoidance of triggers and we have reviewed available treatments,      recognizing she saw little benefit form previous bronchodilators.      We discussed the possibility of allergy vaccine trial and she is      going to check on insurance coverage.  2. Probable significant, persistent gastroesophageal reflux disease.     She would like  GI evaluation and that will be arranged.      Meanwhile, she will  continue Zegerid.  3. Milk intolerance, possibly a lactose problem. Nothing that she did      not skin test positive for milk. She is instructed to avoid foods      that cause symptoms.   PLAN:  1. Blood for IGE level.  2. Start allergy vaccine if able.  3. GI referral for evaluation of GERD.  4. Environmental precautions.  5. Try Intal inhaler 2 puffs q.i.d. for at least 1 month.  6. Schedule return in 1 month, earlier p.r.n.     Clinton D. [name], MD, FCCP,  FACP  Electronically Signed    CDY/MedQ  DD: 12/12/2006  DT: 12/12/2006  Job #: [id number]   cc:   Burnett Harry. Joya Gaskins, MD, [facility]  Shanon Rosser, M.D.

## 2010-12-13 NOTE — Assessment & Plan Note (Signed)
[facility]                            CARDIOLOGY OFFICE NOTE   Cheryl [name]                     MRN:          [id number]  DATE:08/31/2006                            DOB:          09-29-1965    REFERRING PHYSICIAN:  Shanon [name], M.D.   REASON FOR CONSULTATION:  Palpitations, dyspnea and chest pain.   HISTORY OF PRESENT ILLNESS:  [name] is a complicated 45 year old  woman with multiple medical problems who comes today with her mother for  further evaluation of a several month history of chest pain,  palpitations, shortness of breath and fatigue.   Apparently, she was relatively healthy until 2004 and then developed a  severe fatigue and weakness.  She was seen by numerous specialists at  multiple facilities including [facility] and [facility].  She was evaluated  by neurologist, oncologist and rheumatologist.  She was diagnosed with  fibromyalgia and chronic fatigue.  She also saw Cheryl [name] who thought  her symptoms might be related to human parvo virus.  She was treated  with IV IG and a pulse therapy for several years which stopped in 2007.  She has not been able to work since March of last year due to the  weakness.   Since September of last year, both her and her mother report increasing  shortness of breath and palpitations.  She says that her heart rate can  often go to 130 when just walking around in [place].  She has severe  shortness of breath.  I thought she might have bronchitis or asthma, and  she has been on nebulizers with just some mild improvement.  She also  has central chest pain which can come on at any time with some radiation  to her arm.  She says that with the palpitations she often feels sweaty  and short of breath and occasionally will get a headache.  She feels  congestion, thus, her blood pressure is labile.  She also endorses  swelling, more prominent in her left leg than her right.  Her mother is  very  concerned about her symptoms and asked today if she could be  admitted for a full workup.   REVIEW OF SYSTEMS:  Notable for allergies to hay fever, fatigue, fatty  liver, reflux disease, arthritis, hypothyroidism, depression.  The  remainder of review of systems is as per HPI and problem list,  otherwise, all systems negative.   PAST MEDICAL HISTORY:  1. Fibromyalgia.  2. Chronic fatigue syndrome.  3. Morbid obesity.  4. Hypothyroidism.  5. Depression.  6. Hyperlipidemia.  7. Questionable history of asthmatic bronchitis.   CURRENT MEDICATIONS:  1. Birth control pills.  2. Valium 5 mg b.i.d.  3. Cymbalta 60 mg daily.  4. Multivitamin.  5. DuoNeb's.  6. Cardizem LA  7. Tussionex q.h.s.  8. Protonix 40 mg daily.  9. Percocet.  10.Xopenex.   ALLERGIES:  LYRICA, SULFA, CIPRO, IV CONTRAST DYE, SHELLFISH, EGGS.   SOCIAL HISTORY:  She is unemployed.  She lives at home.  She is married  with one child.  Does not drink  or smoke.   FAMILY HISTORY:  Mother is alive and well at 75.  Father died at age 18  due to a heart attack and drowning.  One brother is 41 and has  hypothyroidism.   PHYSICAL EXAMINATION:  GENERAL:  She is a chronically fatigued appearing  woman who appears much older than her stated age.  She walks with a  cane.  She has some apparent audible wheezing.  VITAL SIGNS:  Blood pressure 102/65, heart rate 105, weight 208.  HEENT:  Sclerae anicteric.  EOMI.  There are no xanthelasma.  Mucous  membranes moist.  Oropharynx clear.  NECK:  Supple.  No JVD.  Carotids are 2+ bilateral with no bruits.  No  lymphadenopathy or thyromegaly.  CARDIAC:  She is mild tachycardiac.  Regular.  No murmurs or gallops.  LUNGS:  Both inspiratory and expiratory wheezing.  I am not sure whether  this is true wheezing or a pseudowheeze.  ABDOMEN:  Obese, nontender, nondistended.  I am unable to appreciate any  hepatosplenomegaly.  No bruits, no masses.  Good bowel sounds.  EXTREMITIES:   Warm with no cyanosis, clubbing or edema.  NEUROLOGICAL:  She has a flat affect, moves all four extremities without  difficulty.  Cranial nerves II-XII grossly intact.   STUDIES:  EKG shows sinus tachycardia at a rate of 105 with normal axis  and intervals.  There are no ST-T wave changes.   ASSESSMENT/PLAN:  1. Fatigue.  I suspect this is multifactorial and primarily related to      her chronic fatigue and fibromyalgia.  She does have the body      habitus for obstructive sleep apnea and does endorse storing quite      a bit.  She is scheduled to see Dr. Melvyn [name] next week, and I have      asked her to ask him about possible sleep study.  2. Dyspnea.  She does not appear to have any fluid overload on exam.      We will go ahead and get a 2D echocardiogram as well as pulmonary      function tests to further evaluate.  3. Chest pain.  This has both typical and atypical features.  We will      set her up for an adenosine Myoview.  4. Palpitations.  There was some question whether this may be related      to taking too much Synthroid.  We will put a 48-hour Holter monitor      on her and get a clear understanding of what her heart rates are      doing.  I would also send off a 24-hour urine to rule out      pheochromocytoma.   DISPOSITION:  We will see her back in three weeks to review the results  of her testing.  I attempted to reassure her mother and her that I did  not see an obvious cardiac source of her symptoms, but that we would  undertake and workup to see if we could turn up anything to help explain  her symptoms.     Cheryl Pascal. Bensimhon, MD  Electronically Signed    DRB/MedQ  DD: 08/31/2006  DT: 08/31/2006  Job #: [id number]   cc:   Cheryl [name], M.D.

## 2010-12-13 NOTE — Op Note (Signed)
NAME:  Cheryl [name]                        ACCOUNT NO.:  [id number]   MEDICAL RECORD NO.:  [id number]                   PATIENT TYPE:  OBV   LOCATION:  4680                                 FACILITY:  [facility]   PHYSICIAN:  [name] T. Hoxworth, M.D.          DATE OF BIRTH:  August 29, 1965   DATE OF PROCEDURE:  10/09/2003  DATE OF DISCHARGE:                                 OPERATIVE REPORT   PREOPERATIVE DIAGNOSIS:  Chronic abdominal and pelvic pain.   POSTOPERATIVE DIAGNOSIS:  Chronic abdominal and pelvic pain.   OPERATION/PROCEDURE:  Laparoscopic lysis of adhesions.   SURGEON:  Darene Lamer. Hoxworth, M.D.   ANESTHESIA:  General.   BRIEF HISTORY:  Cheryl [name] is a 45 year old female with a history of  abdominal surgery including open cholecystectomy in 1989 and TAHBSO in 1993  for endometriosis.  She now has a seven to eight year history of chronic and  worsening right lower quadrant and right pelvic pain.  She underwent  laparoscopy by Dr. Helane Rima on September 08, 2003 with findings of extensive  bowel and omental adhesions which were not taken down at that time due to  the extensive nature.  I was sent to see the patient in consultation.  After  extensive discussion regarding options, and a full mechanical and antibiotic  bowel prep.  She was brought to the operating room for laparoscopy and lysis  of adhesions.  The initial procedure, indications, risks of bleeding,  infection, intestinal injury, possible need for open procedure, and failure  to relieve her pain have been discussed and understood.   DESCRIPTION OF PROCEDURE:  The patient was brought to the operating room,  placed in the supine position on the operating table and general  endotracheal anesthesia was induced.  She was given broad-spectrum  antibiotics preoperatively.  She had undergone a mechanical antibiotic bowel  prep.  Foley catheter was placed.  The abdomen was widely sterilely prepped  and draped.   A 1  cm incision was made in the umbilicus and dissection carried down to the  midline fascia which was sharply incised transversely for 1 cm and the  peritoneum entered under direct vision.  I was able to enter free peritoneal  space on the left mid abdomen and the Hasson trocar was placed through a  mattress suture of 0 Vicryl with pneumoperitoneum established.  Following  this, two 5 mm trocars were placed in the left mid abdomen and left lower  quadrant.  Through a 5 mm laparoscope through one of these ports, the  Harmonic scalpel was used to take down fairly extensive omental adhesions to  her lower midline from the previous peritoneal incision and from her  Pfannenstiel incision.  The omentum was taken down toward the pelvis.  At  that point the 10 mm camera could be replaced in the umbilicus port and  working through the two 5 mm ports in the left abdomen, extensive omental  adhesions were taken down from the pelvis and pelvic sidewalls and small  bowel using careful sharp dissection and Harmonic scalpel under direct  vision until the omentum could all be brought up out of the pelvis.  There  were fairly extensive adhesions of the ileum into the pelvis along the  vaginal cuff and pelvic sidewall.  Another 5 mm trocar was placed in the  right lower quadrant at the old Pfannenstiel incision site for further  exposure.  Under direct vision all these small bowel adhesions were then  completely taken down and mobilized out of the pelvis with fairly extensive  tedious dissection.  At this point the pelvis was completely clear and empty  except, of course, for the rectosigmoid that appeared normal.  The small  bowel was carefully traced from the ileocecal valve proximally to the  jejunum and a few interloop adhesions were also taken down, and all  adhesions lysed.  The cecum and terminal ileum were also fully mobilized.  The __________ of the cecum were followed to what appeared to be an   appendiceal stump.  I did not see any suture material in this but I did not  see any evidence that the appendix remained after full mobilization of the  cecum.  Following this, some further adhesions of the omentum to the right  upper quadrant incision were taken down with the Harmonic scalpel completely  freeing the entire anterior abdominal wall.   At this point the abdomen and pelvis were copiously irrigated with saline  and the bowel was again carefully run and was free of adhesions and without  any evidence of injury.  Again, the pelvis was completely cleared.  At this  point trocars were removed under direct vision and all CO2 evacuated from  the peritoneal cavity.  The procedure had taken 2-1/2 hours.  The  pursestring suture was secured at the umbilicus.  Skin incisions were closed  with subcuticular 4-0 Monocryl and Steri-Strips.  Sponge, needle and  instrument counts were correct.  Dry sterile dressings were applied and the  patient was taken to recovery in satisfactory condition.                                               Darene Lamer. Hoxworth, M.D.    [place]  D:  10/09/2003  T:  10/09/2003  Job:  [id number]   cc:   Sharyn Lull L. Helane Rima, M.D.  [address] [place]  [place] [address]  Fax: [phone]   Domingo Pulse, M.D.  [place] [place]  [place] [address]  Fax: [phone]   Nelwyn Salisbury, M.D.  [address]  [address]  [place] [address]  Fax: [phone]   Mellody Memos, M.D.  [address] [place]  [place] [address]  Fax: [phone]

## 2010-12-13 NOTE — Op Note (Signed)
[place]  Patient:    Cheryl [name]                     MRN: [id number] Proc. Date: 01/22/01 Adm. 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  OPERATION: 1. Release of iliotibial band with total bursectomy. 2. Partial excrescectomy of the greater trochanteric ridge.  SURGEON:  Alta Corning, M.D.  ASSISTANT:  Alvina Filbert. Natividad Brood.  ANESTHESIA:  General.  BRIEF HISTORY:  She is a 45 year old female with a long history of having right trochanteric bursitis.  We ultimately had injected her multiple times with relief of symptoms but with ultimate return of symptoms.  We finally did a large field local block in the office one day when she was having severe symptoms, and it did relieve her symptoms instantaneously.  Because of this and failure of all conservative care, the patient is brought to the operating room for release of IT band and release removal of trochanteric bursa and partial excrescectomy.  DESCRIPTION OF PROCEDURE:  The patient was brought to the operating room, and after adequate anesthesia was obtained with general anesthesia, the patient was placed in the right lateral decubitus position.  All bony prominences were well padded.  The right hip was then prepped and draped in the usual sterile fashion.  Following this, routine preparation and draping of the hip was undertaken.  Following this, a linear incision as made over the area of the greater trochanter.  Subcutaneous tissue were taken down to the level of the tensor fascia which was identified and divided in line with its fibers.  The muscle of the tensor fascia did come well down over the  trochanter.  At this point, a total bursectomy was performed inferior to the IT band, and the IT band was then released in a stellate fashion anterior as it was completely released posteriorly.  Once this was undertaken, the trochanter was free. There was a fair amount of bony excrescence over the area of the trochanter, and this was taken down with an osteotome and then a rongeur around the edges, and a rasp was used to smooth this area.  Bone wax was placed over this area. The wound was then copiously irrigated and suctioned dry.  The deep subcutaneous layer was closed with an 0 Vicryl interrupted suture, the subcuticular with a 2-0 Vicryl interrupted suture, and the skin with a subcutaneous suture.  Benzoin and Steri-Strips were then applied and the patient taken to the recovery room where she was noted to be in satisfactory condition.  Estimated blood loss for the procedure was none. DD:  01/22/01 TD:  01/22/01 Job: [id number]

## 2010-12-13 NOTE — Assessment & Plan Note (Signed)
[facility]                             PULMONARY OFFICE NOTE   [name]                     MRN:          [id number]  DATE:10/29/2006                            DOB:          22-Nov-1965    Cheryl [name] returns in followup still wheezing and coughing, mostly  dry, nonproductive.  She is no longer smoking.  She does awaken at night  choking, still having acid reflux symptom complexes.  Still is having  hoarseness.  She is on the Protonix 40 mg twice daily, off Flovent,  Spiriva, uses Tussionex p.r.n. cough, is on the diazepam 5 mg t.i.d.,  Cymbalta 60 mg daily, Cardizem LA 180 mg daily.   PHYSICAL EXAMINATION:  Temperature 97, blood pressure 128/90, pulse 107,  saturation 94% on room air.  Chest showed diminished breath sounds with no evidence of wheeze or  rhonchi.  CARDIAC EXAM:  Showed a regular rate and rhythm without S3, normal S1,  S2.  ABDOMEN:  Soft, nontender.  EXTREMITIES:  Showed no edema or clubbing.  The patient did have audible pseudo wheeze throughout the examination.  Nares showed increased nasal inflammation.   IMPRESSION:  1. Allergic rhinitis with postnasal drip syndrome, and potential      underlying allergic factors.  For this we will have evaluation with      Dr. Baird Lyons for allergy evaluation, in particular, we would      appreciate if repeat skin testing could be done to see if there      could be environmental factors triggering postnasal drip, which is      triggering her vocal cord dysfunction syndrome.  Again, we have not      documented true asthma in this patient.  2. Continue with pseudo wheeze vocal cord dysfunction syndrome.  3. Reflux disease which is ongoing.   PLAN:  1. Discontinue Protonix and begin Zegerid 40 mg twice daily for 1      week, then down to 40 mg daily thereafter.  2. Obstructive sleep apnea sleep study was obtained and reviewed and      revealed an RDI of 7 with desat to  83%, moderate snoring that is      positional.  Recommendation for this is to obtain an auto AutoSet T      with downloadable report to see what pressure we will maintain this      patient, and oxygen will be discontinued.     Burnett Harry Joya Gaskins, MD, [facility]  Electronically Signed    PEW/MedQ  DD: 10/29/2006  DT: 10/29/2006  Job #: [id number]   cc:   [name], M.D.

## 2010-12-13 NOTE — Procedures (Signed)
NAMEAmbrose [name] NO.:  [id number]   MEDICAL RECORD NO.:  [id number]          PATIENT TYPE:  OUT   LOCATION:  SLEEP CENTER                 FACILITY:  [facility]   PHYSICIAN:  Kathee Delton, MD,FCCPDATE OF BIRTH:  Oct 10, 1966   DATE OF STUDY:                            NOCTURNAL POLYSOMNOGRAM   INDICATION FOR STUDY:  Hypersomnia with sleep apnea.   EPWORTH SLEEPINESS SCORE:  16.   SLEEP ARCHITECTURE:  The patient had a total sleep time of 345 minutes  with decreased REM and never achieved slow wave sleep. Sleep onset  latency was normal and REM onset was prolonged at 227 minutes. Sleep  efficiency was decreased at 83%.   RESPIRATORY DATA:  The patient was found to have 39 hypopnea's and one  apnea for an apnea/hypopnea index of 7 events per hour. The events were  clearly worse in the supine position and moderate snoring was noted  throughout.   OXYGEN DATA:  Oxygen desaturation as low as 83% with the patient's  obstructive events.   CARDIAC DATA:  No clinically significant cardiac arrhythmias were noted.   MOVEMENT-PARASOMNIA:  The patient was found to have 36 leg jerks at 1.4  per hour resulting in arousal or awakening. This appears to be  clinically insignificant.   IMPRESSIONS-RECOMMENDATIONS:  1. Very mild obstructive sleep apnea/hypopnea syndrome with a      respiratory disturbance index of 7 events per hour and O2      desaturation as low as 83%. The events were worse in the supine      position. It should be noted that the patient had very little slow      wave sleep or REM and therefore her degree of sleep apnea may be      somewhat underestimated.  Clinical correlation is suggested.      Treatment for this degree of sleep apnea can include weight loss      alone if applicable, position therapy, oral appliance,      consideration for upper airway surgery, and also CPAP.  Treatment      selection will depend on how symptomatic the patient is, and  her      particular life style.      Kathee Delton, MD,FCCP  Diplomate, [place] Board of Sleep  Medicine  Electronically Signed    KMC/MEDQ  D:  10/20/2006 16:40:22  T:  10/20/2006 20:36:34  Job:  [id number]

## 2010-12-24 ENCOUNTER — Ambulatory Visit: Payer: Medicare Other | Admitting: Physical Therapy

## 2010-12-27 ENCOUNTER — Encounter: Payer: Self-pay | Admitting: Internal Medicine

## 2010-12-27 ENCOUNTER — Ambulatory Visit (INDEPENDENT_AMBULATORY_CARE_PROVIDER_SITE_OTHER): Payer: Medicare Other | Admitting: Internal Medicine

## 2010-12-27 VITALS — BP 124/80 | HR 109 | Ht 62.0 in | Wt 194.0 lb

## 2010-12-27 DIAGNOSIS — J45909 Unspecified asthma, uncomplicated: Secondary | ICD-10-CM

## 2010-12-27 DIAGNOSIS — G4710 Hypersomnia, unspecified: Secondary | ICD-10-CM

## 2010-12-27 NOTE — Assessment & Plan Note (Signed)
Good control. Not needing her rescue inhaler currently.

## 2010-12-27 NOTE — Assessment & Plan Note (Signed)
Continues compliant with CPAP 10 cwp through [facility].

## 2010-12-27 NOTE — Patient Instructions (Addendum)
Please call if needed  Our goal with CPAP is all night, every night. It might be good to touch base with [facility] to see if they need to do anything, since it has been awhile.

## 2010-12-27 NOTE — Progress Notes (Signed)
Subjective:    Patient ID: Cheryl [name], female    DOB: 08-Jun-1966, 45 y.o.   MRN: [id number]  Asthma Her past medical history is significant for asthma.   June 24, 2010- Allergic rhinitis, OSA, DM, GERD..........................[name] here  Nurse-CC: 4 month follow up visit-wheezing, few asthma attacks, sneezing(using nasal sprays). Sleep- waking up "alot" and other days sleeping all day and not able to wake up well enough.  Now on cefdinir for sinusitis. Sneezing all Fall. Had CT sinus at [facility]- "chronic sinusitis".  Mother reports fall 2 weeks ago- fell onto knees and hands when knees buckled. [name] now when she stands. Evaluated at [facility]. Anticipate referral to [facility] neurology. Had flu vax. Still lives same house.  Continues CPAP. When too tight she got pressure marks- discussed. Not smoking. Getting divorced- looking at options.   August 28, 2010-Allergic rhinitis, Asthma, OSA, DM, GERD.............[name] here  Nurse-CC: Follow up per [name], FNP. Pt c/o chest pressure and tightness, pains in chest when breathing, cough with very little yellow mucus, increased SOB at rest and with activity x 1 wk.  CPAP 10- Usually using it all night every night. Has had to skip this week while ill.  Says she has been on abx since October for sinusitis- did CT sinus. Gets better then worse. In last 1-2 weeks, increased cough and wheeze , tussive soreness mid anterior and left lateral chest. CBC and CXR at [name]. Says 2 days ago WBC 14000, given nebs. Now on doxy 257m daily.  [facility].   11/11/10- Acute OV-Walk in  Complains of increased SOB, wheezing, dry cough, chest congestion, low grade temp x2days. Has a lot of sinus drainage, cough, post nasal drip and wheezing. Wears CPAP at night, no interference. Teeth hurt at  at times. Has fatigue and no energy.  Has been taking zyrtec without much help. Very dry in sinuses. Frontal headache.    12/27/10- Allergic rhinitis, Asthma, OSA, complicated by DM, GERD  Mother here After seeing TP in April got welll after 2 rounds of omnicef. Tongue feels funny. Diabetic control has not been good.  Breathing has been good.    Review of Systems  Constitutional:   No  weight loss, night sweats,  , chills,   HEENT:  No  Difficulty swallowing,  Tooth/dental problems, or  Sore throat,                + sneezing, itching, ear ache, nasal congestion, post nasal drip,   CV:  No chest pain,  Orthopnea, PND, swelling in lower extremities, anasarca, dizziness, palpitations, syncope.   GI  No heartburn, indigestion, abdominal pain, nausea, vomiting, diarrhea, change in bowel habits, loss of appetite, bloody stools.   Resp:    No excess mucus, no productive cough,    Skin: peeling on sole  foot  GU: no dysuria, change in color of urine, no urgency or frequency.  No flank pain, no hematuria   MS:  No joint   swelling.    Psych:  No change in mood or affect.    No memory loss.    Objective:   Physical Exam  GEN: A/Ox3; pleasant , NAD, well nourished   HEENT:  [place]/AT,  EACs-clear, TMs-wnl, NOSE-clear mucosa , THROAT-clear, no lesions, max sinus tenderness   NECK:  Supple w/ fair ROM; no JVD; normal carotid impulses w/o bruits; no thyromegaly or nodules palpated; no lymphadenopathy.  RESP  Coarse BS w/  no wheezing   CARD:  RRR, no m/r/g  , no peripheral edema, pulses intact, no cyanosis or clubbing.  GI:   Soft & nt; nml bowel sounds; no organomegaly or masses detected.  Musco: Warm bil, no deformities or joint swelling noted.   Neuro: alert, no focal deficits noted.    Skin: Warm, no lesions or rashes         Assessment & Plan:

## 2011-01-30 ENCOUNTER — Ambulatory Visit: Payer: Medicare Other | Admitting: Physical Therapy

## 2011-02-06 ENCOUNTER — Ambulatory Visit: Payer: Medicare Other | Attending: Family Medicine | Admitting: Physical Therapy

## 2011-02-06 DIAGNOSIS — R262 Difficulty in walking, not elsewhere classified: Secondary | ICD-10-CM | POA: Insufficient documentation

## 2011-02-06 DIAGNOSIS — M6281 Muscle weakness (generalized): Secondary | ICD-10-CM | POA: Insufficient documentation

## 2011-02-06 DIAGNOSIS — IMO0001 Reserved for inherently not codable concepts without codable children: Secondary | ICD-10-CM | POA: Insufficient documentation

## 2011-02-06 DIAGNOSIS — R5381 Other malaise: Secondary | ICD-10-CM | POA: Insufficient documentation

## 2011-02-19 ENCOUNTER — Ambulatory Visit: Payer: Medicare Other | Admitting: Physical Therapy

## 2011-02-27 ENCOUNTER — Ambulatory Visit: Payer: Medicare Other | Attending: Family Medicine | Admitting: Physical Therapy

## 2011-02-27 DIAGNOSIS — M6281 Muscle weakness (generalized): Secondary | ICD-10-CM | POA: Insufficient documentation

## 2011-02-27 DIAGNOSIS — R5381 Other malaise: Secondary | ICD-10-CM | POA: Insufficient documentation

## 2011-02-27 DIAGNOSIS — R262 Difficulty in walking, not elsewhere classified: Secondary | ICD-10-CM | POA: Insufficient documentation

## 2011-02-27 DIAGNOSIS — IMO0001 Reserved for inherently not codable concepts without codable children: Secondary | ICD-10-CM | POA: Insufficient documentation

## 2011-04-08 ENCOUNTER — Telehealth: Payer: Self-pay | Admitting: Internal Medicine

## 2011-04-08 NOTE — Telephone Encounter (Signed)
I think she has taken Zpak ok before. Please offer that and recommend extra fluids.

## 2011-04-08 NOTE — Telephone Encounter (Signed)
LMTCB

## 2011-04-08 NOTE — Telephone Encounter (Signed)
I spoke with pt and she states the left side of her neck is sore. Pt states she believes she may have a swollen lymph node. Pt states she is not able to eat and she feels very nauseated. Pt also c/o wheezing and chest tightness. Pt denies any fever, vomiting. Pt is requesting recs from Dr. [name]. Please advise, thanks  Allergies  Allergen Reactions  . Biaxin (Clarithromycin) Hives  . Nucynta (Tapentadol Hydrochloride) Other (See Comments)    Halucinations  . Viibryd Other (See Comments)    Hallucinations  . Amoxicillin   . Ciprofloxacin   . Iohexol      Desc: hives,dyspnea; throat swelling; ok w/ premeds and omnipaque   . Levofloxacin   . Pregabalin   . Sulfonamide Derivatives   . Percocet (Oxycodone-Acetaminophen) Rash    Charma Igo, CMA

## 2011-04-09 MED ORDER — AZITHROMYCIN 250 MG PO TABS: ORAL_TABLET | ORAL | Status: AC

## 2011-04-09 NOTE — Telephone Encounter (Signed)
I spoke with pt and she states she has taken the zpak before and was fine if that was called in. Pt also aware to drink extra fluids. Pt states she has been doing that. I advised pt if she gets worse then to call back or seek emergency. Pt verbalized understanding and rx was sent

## 2011-04-11 ENCOUNTER — Telehealth: Payer: Self-pay | Admitting: Internal Medicine

## 2011-04-11 MED ORDER — CEFDINIR 300 MG PO CAPS: 300.0000 mg | ORAL_CAPSULE | Freq: Two times a day (BID) | ORAL | Status: AC

## 2011-04-11 NOTE — Telephone Encounter (Signed)
Per [facility]Y call in cefdinir 300 mg # 20 bid no refills. I spoke with pt and notified this will be called in. I advised her to seek emergency care over the wkend if worsens or call Monday if not improving for appt. Pt verbalized understanding.

## 2011-04-11 NOTE — Telephone Encounter (Signed)
Called and spoke with pt and she stated that she has 2 days left of the zpak and has been taking the zyrtec.  Pt has been having throat pain--feels like its on the outside and not the inside of her throat.   She stated taht she has coughing spells at times and is coughing up green sputum at times.    CY please advise. Thanks   Allergies  Allergen Reactions  . Biaxin (Clarithromycin) Hives  . Nucynta (Tapentadol Hydrochloride) Other (See Comments)    Halucinations  . Viibryd Other (See Comments)    Hallucinations  . Amoxicillin   . Ciprofloxacin   . Iohexol      Desc: hives,dyspnea; throat swelling; ok w/ premeds and omnipaque   . Levofloxacin   . Pregabalin   . Sulfonamide Derivatives   . Percocet (Oxycodone-Acetaminophen) Rash

## 2011-04-11 NOTE — Telephone Encounter (Signed)
Per [facility] suggest amox 500 mg 1 TID x 7 #21  Pt states she breaks out in hives with amoxicillin. Pt states the only thing she can take is omnicef. Please advise. Thanks Allergies  Allergen Reactions  . Biaxin (Clarithromycin) Hives  . Nucynta (Tapentadol Hydrochloride) Other (See Comments)    Halucinations  . Viibryd Other (See Comments)    Hallucinations  . Amoxicillin   . Ciprofloxacin   . Iohexol      Desc: hives,dyspnea; throat swelling; ok w/ premeds and omnipaque   . Levofloxacin   . Pregabalin   . Sulfonamide Derivatives   . Percocet (Oxycodone-Acetaminophen) Rash    Charma Igo, CMA

## 2011-04-25 LAB — GLUCOSE, CAPILLARY
Glucose-Capillary: 179 — ABNORMAL HIGH
Glucose-Capillary: 182 — ABNORMAL HIGH
Glucose-Capillary: 184 — ABNORMAL HIGH
Glucose-Capillary: 202 — ABNORMAL HIGH
Glucose-Capillary: 211 — ABNORMAL HIGH
Glucose-Capillary: 214 — ABNORMAL HIGH
Glucose-Capillary: 217 — ABNORMAL HIGH
Glucose-Capillary: 218 — ABNORMAL HIGH
Glucose-Capillary: 239 — ABNORMAL HIGH
Glucose-Capillary: 240 — ABNORMAL HIGH
Glucose-Capillary: 248 — ABNORMAL HIGH
Glucose-Capillary: 260 — ABNORMAL HIGH
Glucose-Capillary: 293 — ABNORMAL HIGH
Glucose-Capillary: 310 — ABNORMAL HIGH
Glucose-Capillary: 314 — ABNORMAL HIGH
Glucose-Capillary: 317 — ABNORMAL HIGH
Glucose-Capillary: 324 — ABNORMAL HIGH
Glucose-Capillary: 325 — ABNORMAL HIGH
Glucose-Capillary: 335 — ABNORMAL HIGH
Glucose-Capillary: 355 — ABNORMAL HIGH
Glucose-Capillary: 357 — ABNORMAL HIGH
Glucose-Capillary: 368 — ABNORMAL HIGH
Glucose-Capillary: 566
Glucose-Capillary: 105 — ABNORMAL HIGH
Glucose-Capillary: 114 — ABNORMAL HIGH
Glucose-Capillary: 117 — ABNORMAL HIGH
Glucose-Capillary: 117 — ABNORMAL HIGH
Glucose-Capillary: 118 — ABNORMAL HIGH
Glucose-Capillary: 122 — ABNORMAL HIGH
Glucose-Capillary: 125 — ABNORMAL HIGH
Glucose-Capillary: 128 — ABNORMAL HIGH
Glucose-Capillary: 134 — ABNORMAL HIGH
Glucose-Capillary: 142 — ABNORMAL HIGH
Glucose-Capillary: 142 — ABNORMAL HIGH
Glucose-Capillary: 159 — ABNORMAL HIGH
Glucose-Capillary: 161 — ABNORMAL HIGH
Glucose-Capillary: 163 — ABNORMAL HIGH
Glucose-Capillary: 165 — ABNORMAL HIGH
Glucose-Capillary: 168 — ABNORMAL HIGH
Glucose-Capillary: 172 — ABNORMAL HIGH
Glucose-Capillary: 180 — ABNORMAL HIGH
Glucose-Capillary: 209 — ABNORMAL HIGH
Glucose-Capillary: 217 — ABNORMAL HIGH
Glucose-Capillary: 229 — ABNORMAL HIGH
Glucose-Capillary: 230 — ABNORMAL HIGH
Glucose-Capillary: 239 — ABNORMAL HIGH
Glucose-Capillary: 269 — ABNORMAL HIGH
Glucose-Capillary: 292 — ABNORMAL HIGH
Glucose-Capillary: 338 — ABNORMAL HIGH
Glucose-Capillary: 368 — ABNORMAL HIGH
Glucose-Capillary: 96
Glucose-Capillary: 98
Glucose-Capillary: 98
Glucose-Capillary: 98

## 2011-04-25 LAB — LIPID PANEL
Cholesterol: 442 — ABNORMAL HIGH
HDL: 41
LDL Cholesterol: UNDETERMINED
Total CHOL/HDL Ratio: 10.8
Triglycerides: 1038 — ABNORMAL HIGH
VLDL: UNDETERMINED

## 2011-04-25 LAB — BASIC METABOLIC PANEL
BUN: 2 — ABNORMAL LOW
BUN: 2 — ABNORMAL LOW
BUN: 4 — ABNORMAL LOW
BUN: 8
BUN: 1 — ABNORMAL LOW
BUN: 1 — ABNORMAL LOW
BUN: 2 — ABNORMAL LOW
BUN: 2 — ABNORMAL LOW
BUN: 3 — ABNORMAL LOW
CO2: 14 — ABNORMAL LOW
CO2: 15 — ABNORMAL LOW
CO2: 16 — ABNORMAL LOW
CO2: 16 — ABNORMAL LOW
CO2: 22
CO2: 22
CO2: 24
CO2: 24
CO2: 26
Calcium: 8.1 — ABNORMAL LOW
Calcium: 8.1 — ABNORMAL LOW
Calcium: 8.2 — ABNORMAL LOW
Calcium: 8.4
Calcium: 8.4
Calcium: 8.5
Calcium: 8.6
Calcium: 8.8
Calcium: 8.9
Chloride: 100
Chloride: 101
Chloride: 106
Chloride: 107
Chloride: 104
Chloride: 105
Chloride: 106
Chloride: 106
Chloride: 109
Creatinine, Ser: 0.48
Creatinine, Ser: 0.64
Creatinine, Ser: 0.65
Creatinine, Ser: <0.3 — ABNORMAL LOW
Creatinine, Ser: 0.42
Creatinine, Ser: 0.42
Creatinine, Ser: 0.46
Creatinine, Ser: 0.57
Creatinine, Ser: 0.58
GFR calc Af Amer: >60
GFR calc Af Amer: >60
GFR calc Af Amer: >60
GFR calc Af Amer: >60
GFR calc Af Amer: >60
GFR calc Af Amer: >60
GFR calc Af Amer: >60
GFR calc Af Amer: >60
GFR calc non Af Amer: >60
GFR calc non Af Amer: >60
GFR calc non Af Amer: >60
GFR calc non Af Amer: >60
GFR calc non Af Amer: >60
GFR calc non Af Amer: >60
GFR calc non Af Amer: >60
GFR calc non Af Amer: >60
Glucose, Bld: 173 — ABNORMAL HIGH
Glucose, Bld: 221 — ABNORMAL HIGH
Glucose, Bld: 291 — ABNORMAL HIGH
Glucose, Bld: 374 — ABNORMAL HIGH
Glucose, Bld: 107 — ABNORMAL HIGH
Glucose, Bld: 228 — ABNORMAL HIGH
Glucose, Bld: 243 — ABNORMAL HIGH
Glucose, Bld: 347 — ABNORMAL HIGH
Glucose, Bld: 86
Potassium: 3.3 — ABNORMAL LOW
Potassium: 3.3 — ABNORMAL LOW
Potassium: 3.4 — ABNORMAL LOW
Potassium: 4.8
Potassium: 2.9 — ABNORMAL LOW
Potassium: 3.2 — ABNORMAL LOW
Potassium: 3.5
Potassium: 3.5
Potassium: 3.7
Sodium: 129 — ABNORMAL LOW
Sodium: 130 — ABNORMAL LOW
Sodium: 132 — ABNORMAL LOW
Sodium: 133 — ABNORMAL LOW
Sodium: 133 — ABNORMAL LOW
Sodium: 134 — ABNORMAL LOW
Sodium: 137
Sodium: 139
Sodium: 140

## 2011-04-25 LAB — DIFFERENTIAL
Basophils Absolute: 0
Basophils Relative: 1
Eosinophils Absolute: 0.3
Eosinophils Relative: 4
Lymphocytes Relative: 38
Lymphs Abs: 3.5
Monocytes Absolute: 0.5
Monocytes Relative: 6
Neutro Abs: 4.7
Neutrophils Relative %: 52

## 2011-04-25 LAB — URINALYSIS, ROUTINE W REFLEX MICROSCOPIC
Glucose, UA: >1000 — AB
Hgb urine dipstick: NEGATIVE
Ketones, ur: 40 — AB
Leukocytes, UA: NEGATIVE
Nitrite: NEGATIVE
Protein, ur: NEGATIVE
Specific Gravity, Urine: 1.045 — ABNORMAL HIGH
Urobilinogen, UA: 0.2
pH: 5.5

## 2011-04-25 LAB — HEPATITIS C ANTIBODY: HCV Ab: NEGATIVE

## 2011-04-25 LAB — HEPATIC FUNCTION PANEL
ALT: 34
ALT: 34
AST: 39 — ABNORMAL HIGH
AST: 88 — ABNORMAL HIGH
Albumin: 3.5
Albumin: 3.7
Alkaline Phosphatase: 103
Alkaline Phosphatase: 94
Bilirubin, Direct: 0.5 — ABNORMAL HIGH
Bilirubin, Direct: 1.2 — ABNORMAL HIGH
Indirect Bilirubin: 1.2 — ABNORMAL HIGH
Indirect Bilirubin: 2 — ABNORMAL HIGH
Total Bilirubin: 1.7 — ABNORMAL HIGH
Total Bilirubin: 3.2 — ABNORMAL HIGH
Total Protein: 5.9 — ABNORMAL LOW
Total Protein: 8.9 — ABNORMAL HIGH

## 2011-04-25 LAB — TSH: TSH: 2.937

## 2011-04-25 LAB — HEPATITIS A ANTIBODY, TOTAL: Hep A Total Ab: NEGATIVE

## 2011-04-25 LAB — CARDIAC PANEL(CRET KIN+CKTOT+MB+TROPI)
CK, MB: 0.4
CK, MB: 0.5
CK, MB: 0.6
Relative Index: INVALID
Relative Index: INVALID
Relative Index: INVALID
Total CK: 42
Total CK: 47
Total CK: 61
Troponin I: 0.01
Troponin I: <0.01
Troponin I: <0.01

## 2011-04-25 LAB — LIPASE, BLOOD: Lipase: 26

## 2011-04-25 LAB — CBC
HCT: 36.5
Hemoglobin: 12.9
MCHC: 35.5
MCV: 87.2
Platelets: 319
RBC: 4.18
RDW: 15.3
WBC: 9.1

## 2011-04-25 LAB — KETONES, QUALITATIVE

## 2011-04-25 LAB — URINE MICROSCOPIC-ADD ON

## 2011-04-25 LAB — LACTIC ACID, PLASMA: Lactic Acid, Venous: 1.1

## 2011-04-25 LAB — HEPATITIS B CORE ANTIBODY, TOTAL: Hep B Core Total Ab: NEGATIVE

## 2011-04-25 LAB — ACETAMINOPHEN LEVEL: Acetaminophen (Tylenol), Serum: <10 — ABNORMAL LOW

## 2011-07-03 ENCOUNTER — Ambulatory Visit: Payer: Medicare Other | Admitting: Internal Medicine

## 2011-07-08 ENCOUNTER — Encounter: Payer: Self-pay | Admitting: Internal Medicine

## 2011-07-08 ENCOUNTER — Ambulatory Visit (INDEPENDENT_AMBULATORY_CARE_PROVIDER_SITE_OTHER): Payer: Medicare Other | Admitting: Internal Medicine

## 2011-07-08 VITALS — BP 118/78 | HR 84 | Temp 97.3°F | Ht 62.0 in | Wt 186.0 lb

## 2011-07-08 DIAGNOSIS — T464X5A Adverse effect of angiotensin-converting-enzyme inhibitors, initial encounter: Secondary | ICD-10-CM

## 2011-07-08 DIAGNOSIS — G4710 Hypersomnia, unspecified: Secondary | ICD-10-CM

## 2011-07-08 DIAGNOSIS — Z23 Encounter for immunization: Secondary | ICD-10-CM

## 2011-07-08 DIAGNOSIS — T44905A Adverse effect of unspecified drugs primarily affecting the autonomic nervous system, initial encounter: Secondary | ICD-10-CM

## 2011-07-08 DIAGNOSIS — G4730 Sleep apnea, unspecified: Secondary | ICD-10-CM

## 2011-07-08 DIAGNOSIS — R05 Cough: Secondary | ICD-10-CM

## 2011-07-08 DIAGNOSIS — R058 Other specified cough: Secondary | ICD-10-CM

## 2011-07-08 DIAGNOSIS — J383 Other diseases of vocal cords: Secondary | ICD-10-CM

## 2011-07-08 DIAGNOSIS — R059 Cough, unspecified: Secondary | ICD-10-CM

## 2011-07-08 MED ORDER — HYDROCODONE-HOMATROPINE 5-1.5 MG/5ML PO SYRP: 2.5000 mL | ORAL_SOLUTION | Freq: Three times a day (TID) | ORAL | Status: AC | PRN

## 2011-07-08 NOTE — Patient Instructions (Addendum)
BP is running a little low and that may be part of the unsteadiness.  Ask Dr [name] if you can try an ARB medicine for a month, instead of ramipril, to see if the ramipril is causing cough  Watch closely for reflux which will cause cough and irritate the throat;  Script for cough syrup- use sparingly.  Flu vax

## 2011-07-08 NOTE — Progress Notes (Signed)
Subjective:    Patient ID: Cheryl [name], female    DOB: 14-Dec-1965, 45 y.o.   MRN: [id number]  Asthma Her past medical history is significant for asthma.   June 24, 2010- Allergic rhinitis, OSA, DM, GERD..........................[name] here  Nurse-CC: 4 month follow up visit-wheezing, few asthma attacks, sneezing(using nasal sprays). Sleep- waking up "alot" and other days sleeping all day and not able to wake up well enough.  Now on cefdinir for sinusitis. Sneezing all Fall. Had CT sinus at [facility]- "chronic sinusitis".  Mother reports fall 2 weeks ago- fell onto knees and hands when knees buckled. Cheryl [name] now when she stands. Evaluated at [facility]. Anticipate referral to [facility] neurology. Had flu vax. Still lives same house.  Continues CPAP. When too tight she got pressure marks- discussed. Not smoking. Getting divorced- looking at options.   August 28, 2010-Allergic rhinitis, Asthma, OSA, DM, GERD.............[name] here  Nurse-CC: Follow up per Cheryl Hoehn, FNP. Pt c/o chest pressure and tightness, pains in chest when breathing, cough with very little yellow mucus, increased SOB at rest and with activity x 1 wk.  CPAP 10- Usually using it all night every night. Has had to skip this week while ill.  Says she has been on abx since October for sinusitis- did CT sinus. Gets better then worse. In last 1-2 weeks, increased cough and wheeze , tussive soreness mid anterior and left lateral chest. CBC and CXR at [name]. Says 2 days ago WBC 14000, given nebs. Now on doxy 247m daily.  [facility] and [facility]- uses walker.   11/11/10- Acute OV-Walk in  Complains of increased SOB, wheezing, dry cough, chest congestion, low grade temp x2days. Has a lot of sinus drainage, cough, post nasal drip and wheezing. Wears CPAP at night, no interference. Teeth hurt at  at times. Has fatigue and no energy.  Has been taking zyrtec without much help. Very dry in sinuses. Frontal headache.    12/27/10- Allergic rhinitis, Asthma, OSA, complicated by DM, GERD  Mother here After seeing TP in April got welll after 2 rounds of omnicef. Tongue feels funny. Diabetic control has not been good.  Breathing has been good.   07/08/11- 89YOF former smoker, followed for asthma, allergic rhinitis, OSA/ failed CPAP, complicated by DM, GERD.   Mother here and offers her observations.  Says she felt okay until yesterday. She couldn't walk well, was falling and had gone back to using her walker. They called 911 and they told her she was okay except glucose was high. Mother wonders about "inner ear"/vertigo. Treated with eardrops in October for otitis left ear. Has Bell's palsy off and on causing some weakness in the left lower face which other associates with episodes of viral illness such as bronchitis, or allergy. [name], [name], with a total of 3 rounds of antibiotics this fall.  She now says that cough began about the time she began ramipril from Dr KDwyane Dee    Review of Systems- see HPI Constitutional:   No-   weight loss, night sweats, fevers, chills,+ fatigue, lassitude. HEENT:   +  headaches, difficulty swallowing, tooth/dental problems, +sore throat,       No-  sneezing, itching, +ear ache, nasal congestion, post nasal drip,  CV:  + chest pain, orthopnea, PND, swelling in lower  extremities, anasarca,                                  dizziness, palpitations Resp: No-   shortness of breath with exertion or at rest.              No-   productive cough,  + non-productive cough,  No- coughing up of blood.              No-   change in color of mucus.  No- wheezing.   Skin: No-   rash or lesions. GI:  No-   heartburn, indigestion, abdominal pain, nausea, vomiting, diarrhea,                  change in bowel habits, loss of appetite GU:  MS:  No-   joint pain or swelling.  No- decreased range of motion.  No- back pain. Neuro-     nothing unusual Psych:  No- change in mood or affect. No depression or anxiety.  No memory loss.   Objective:   Physical Exam General- Alert, Oriented, Affect-appropriate, Distress- none acute. Looks disheveled. Using walker. Skin- rash-none, lesions- none, excoriation- none Lymphadenopathy- none Head- atraumatic            Eyes- Gross vision intact, PERRLA, conjunctivae clear secretions. No nystagmus            Ears- Hearing, canals-normal- no visible problem.            Nose- Clear, no-Septal dev, mucus, polyps, erosion, perforation             Throat- Mallampati II , mucosa clear , drainage- none, tonsils- atrophic. Voice strained/ hoarse. Neck- flexible , trachea midline, no stridor , thyroid nl, carotid no bruit Chest - symmetrical excursion , unlabored           Heart/CV- RRR , no murmur , no gallop  , no rub, nl s1 s2                           - JVD- none , edema- none, stasis changes- none, varices- none           Lung- clear to P&A, wheeze- none, cough- none , dullness-none, rub- none           Chest wall-  Abd- Br/ Gen/ Rectal- Not done, not indicated Extrem- cyanosis- none, clubbing, none, atrophy- none, strength-not assessed. Neuro- grossly intact to observation- using walker, no tremor

## 2011-07-10 ENCOUNTER — Telehealth: Payer: Self-pay | Admitting: Internal Medicine

## 2011-07-10 NOTE — Telephone Encounter (Signed)
Called and spoke with pt.  Pt was seen on 07/08/11 by CY and was told to talk with Dr. [name] about coming off Ramapril d/t ? Cough.  Pt states she called Dr. Ronnie Derby office to inform them of this but was told by Dr. Ronnie Derby office that Dr. [name] will have to call and speak with Dr. [name].  Will forward message to Cy to address.

## 2011-07-12 DIAGNOSIS — R05 Cough: Secondary | ICD-10-CM | POA: Insufficient documentation

## 2011-07-12 DIAGNOSIS — T464X5A Adverse effect of angiotensin-converting-enzyme inhibitors, initial encounter: Secondary | ICD-10-CM | POA: Insufficient documentation

## 2011-07-12 NOTE — Assessment & Plan Note (Signed)
Her dry cough seems almost incidental but persistent and it has been the basis for several requests from her for antibiotics. If possible, I would like to have her ACE inhibitor replaced with another drug, perhaps an ARB, if necessary.

## 2011-07-12 NOTE — Assessment & Plan Note (Signed)
Her vocal quality tonight suggests stress/misuse by a viral upper respiratory infection with laryngitis is not excluded. During observation at this visit she seemed distractible and at times vocal quality sound almost normal.

## 2011-07-17 NOTE — Telephone Encounter (Signed)
Copy of ov note with this information was sent to Dr [name].

## 2011-07-18 NOTE — Telephone Encounter (Signed)
Called spoke with patient, she is aware notes were sent to Dr [name] per [place].  Nothing further needed.

## 2011-12-12 ENCOUNTER — Ambulatory Visit (HOSPITAL_BASED_OUTPATIENT_CLINIC_OR_DEPARTMENT_OTHER)
Admission: RE | Admit: 2011-12-12 | Discharge: 2011-12-12 | Disposition: A | Discharge status: Home / Self Care | Payer: Medicare Other | Source: Ambulatory Visit | Attending: Family Medicine | Admitting: Family Medicine

## 2011-12-12 ENCOUNTER — Other Ambulatory Visit (HOSPITAL_BASED_OUTPATIENT_CLINIC_OR_DEPARTMENT_OTHER): Payer: Self-pay | Admitting: Family Medicine

## 2011-12-12 DIAGNOSIS — R609 Edema, unspecified: Secondary | ICD-10-CM

## 2011-12-12 DIAGNOSIS — M79609 Pain in unspecified limb: Secondary | ICD-10-CM | POA: Insufficient documentation

## 2012-01-07 ENCOUNTER — Ambulatory Visit: Payer: Medicare Other | Admitting: Internal Medicine

## 2012-01-13 ENCOUNTER — Ambulatory Visit: Payer: Medicare Other | Admitting: Gastroenterology

## 2012-03-26 ENCOUNTER — Ambulatory Visit (INDEPENDENT_AMBULATORY_CARE_PROVIDER_SITE_OTHER): Payer: Medicare Other | Admitting: Gastroenterology

## 2012-03-26 ENCOUNTER — Encounter: Payer: Self-pay | Admitting: Gastroenterology

## 2012-03-26 VITALS — BP 118/72 | HR 98 | Ht 62.0 in | Wt 213.0 lb

## 2012-03-26 DIAGNOSIS — R110 Nausea: Secondary | ICD-10-CM

## 2012-03-26 DIAGNOSIS — R1110 Vomiting, unspecified: Secondary | ICD-10-CM

## 2012-03-26 DIAGNOSIS — K3184 Gastroparesis: Secondary | ICD-10-CM

## 2012-03-26 MED ORDER — METOCLOPRAMIDE HCL 10 MG PO TABS: 10.0000 mg | ORAL_TABLET | Freq: Every day | ORAL | Status: DC

## 2012-03-26 NOTE — Patient Instructions (Addendum)
Your stomach empties slowly, likely from diabetes. We will get records from Dr. [name], Dr. Maceo Pro for your most recent Hb A1c.  Tight sugar will help your stomach function best over the long term. You should be eating 4-5 small meals a day rather than 2 larger meals a day. You should change the way you are taking your antiacid medicine (prilosec) so that you are taking it 20-30 minutes prior to a decent meal as that is the way the pill is designed to work most effectively. Reglan 85m pill, at bedtime every night.  This was called into your pharmacy. Return to see Dr. JArdis Hughsin 6-7 weeks, sooner if needed.

## 2012-03-26 NOTE — Progress Notes (Signed)
Review of pertinent gastrointestinal problems: 1. GERD. EGD, July 2008, showed no esophagitis or Barrett's. Bravo pH study performed while staying on Zegerid, showed good control of acid while on medicine.  2. Gastroparesis. EGD, July 2008, showed a large amount of retained food in stomach. She has diabetes and is on chronic narcotics(Vicodin daily). These are likely contributing to her gastroparesis. Gastroparesis in turn can contribute to reflux.     HPI: This is a    46 year old woman whom I last saw about 3 years ago. She is here with her mother today.  Has been having nausea, vomiting for 4 weeks.  Her last Hb a1c was 7.8.  Has been actually gaining weight.  Has had no blood tests or imaging tests to evaluate this.  Had boil on buttock, was put on ceflex.  Vomiting started shortly afterwords.  No real changes in her bowels with all this.  Stopped narcotic pain medicine and is on diclofenac daily now. Also zanaflex.  No other nsaids.  Not on reglan.  Currently taking prilosec at 10pm, also 9am (after breakfast).  She has been only drinking fluids, only coke for 3 weeks.  Previously would eat 2 times a day only.  H   Past Medical History  Diagnosis Date  . GERD (gastroesophageal reflux disease)   . Sleep apnea   . Chronic airway obstruction, not elsewhere classified   . Asthma   . Hyperlipidemia   . Hypothyroidism   . Fibromyalgia   . Depression   . Diabetes mellitus   . Gastroparesis     from DM and chronic narcotic use  . DDD (degenerative disc disease)     CERVIAL AND LUMBAR  . Edema   . Human parvovirus infection   . 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 Fatty liver disease, nonalcoholic   . Migraine headache   . Positive PPD   . Vitamin d deficiency   . Vocal cord dysfunction     Past Surgical History  Procedure Date  . Vaginal hysterectomy   . Cholecystectomy   . Nasal sinus surgery   . Knee surgery   .  Right hip 2002  . Shoulder surgery     RIGHT  . Cervical disc surgery 2004    Current Outpatient Prescriptions  Medication Sig Dispense Refill  . albuterol (PROVENTIL HFA) 108 (90 BASE) MCG/ACT inhaler Inhale 2 puffs into the lungs every 6 (six) hours as needed.        [name] albuterol (PROVENTIL) (2.5 MG/3ML) 0.083% nebulizer solution Take 2.5 mg by nebulization as needed.        [name] amitriptyline (ELAVIL) 25 MG tablet Take 50 mg by mouth at bedtime.      . bumetanide (BUMEX) 1 MG tablet Take 1 mg by mouth daily.      [name] dextrose 5 % SOLN 250 mL with acyclovir 50 MG/ML SOLN Take by mouth as needed.       . diazepam (VALIUM) 5 MG tablet Take 5 mg by mouth 2 (two) times daily.       . diclofenac (VOLTAREN) 50 MG EC tablet Take 50 mg by mouth 2 (two) times daily as needed.       . diltiazem (CARDIZEM CD) 240 MG 24 hr capsule Take 240 mg by mouth daily.        [name] gabapentin (NEURONTIN) 800 MG tablet Take 800 mg by mouth. Take 4 (four) times a day      .  insulin glargine (LANTUS) 100 UNIT/ML injection Inject into the skin as directed. 90 units in the am  70 units in the pm      . Insulin Regular Human (HUMULIN R IJ) Inject as directed. 5 units every 6 hours      . ipratropium-albuterol (DUONEB) 0.5-2.5 (3) MG/3ML SOLN Take 3 mLs by nebulization 4 (four) times daily.        [name] levothyroxine (SYNTHROID, LEVOTHROID) 112 MCG tablet Take 112 mcg by mouth daily.        . meclizine (ANTIVERT) 25 MG tablet Take 25 mg by mouth as needed.        . metFORMIN (GLUCOPHAGE) 500 MG tablet Take 500 mg by mouth 2 (two) times daily with a meal.      . nebivolol (BYSTOLIC) 10 MG tablet Take 5 mg by mouth daily.       . Omega-3-acid Ethyl Esters (LOVAZA PO) Take by mouth as directed.      [name] omeprazole (PRILOSEC) 40 MG capsule Take 40 mg by mouth 2 (two) times daily.       . ondansetron (ZOFRAN) 4 MG tablet Take 4 mg by mouth every 8 (eight) hours as needed.      . rosuvastatin (CRESTOR) 40 MG tablet Take 40 mg by mouth  daily.       . SUMAtriptan (IMITREX) 100 MG tablet Take 100 mg by mouth as needed.        [name] tiZANidine (ZANAFLEX) 4 MG capsule Take 4 mg by mouth 2 (two) times daily.        Allergies as of 03/26/2012 - Review Complete 03/26/2012  Allergen Reaction Noted  . Biaxin (clarithromycin) Hives 10/23/2010  . Nucynta (tapentadol hydrochloride) Other (See Comments) 10/23/2010  . Vilazodone hcl Other (See Comments) 10/23/2010  . Amoxicillin    . Ciprofloxacin  04/09/2009  . Iohexol  07/30/2005  . Levofloxacin    . Pregabalin    . Sulfonamide derivatives  04/09/2009  . Percocet (oxycodone-acetaminophen) Rash 10/23/2010    Family History  Problem Relation Age of Onset  . Coronary artery disease Father     History   Social History  . Marital Status: Legally Separated    Spouse Name: N/A    Number of Children: N/A  . Years of Education: N/A   Occupational History  . Not on file.   Social History Main Topics  . Smoking status: Former Smoker -- 0.1 packs/day for 3 years  . Smokeless tobacco: Never Used  . Alcohol Use: No  . Drug Use: No  . Sexually Active: Not on file   Other Topics Concern  . Not on file   Social History Narrative  . No narrative on file      Physical Exam: BP 118/72  Pulse 98  Ht 5' 2"  (1.575 m)  Wt 213 lb (96.616 kg)  BMI 38.96 kg/m2 Constitutional: generally well-appearing Psychiatric: alert and oriented x3 Abdomen: soft, nontender, nondistended, no obvious ascites, no peritoneal signs, normal bowel sounds     Assessment and plan: 46 y.o. female with acute on chronic nausea, vomiting.  She has previously well-documented gastroparesis based on completely filled stomach at the time of EGD despite fasting for several hours. She is morbidly obese, she does not have good diabetic control.  She used to be on narcotic pain medicines however stopped those which I commended her on. That was only adding to her gastroparesis. She is not on proton pump  inhibitor at the correct timing or  dosing and I have adjusted that. Previously she was taken Reglan and I'm going to add that back to her regimen, nightly bedtime dosing only 10 mg. She will return to see me in 6-7 weeks and sooner if needed. I also again recommended smaller more frequent meals.

## 2012-04-02 ENCOUNTER — Telehealth: Payer: Self-pay | Admitting: Gastroenterology

## 2012-04-02 MED ORDER — ONDANSETRON HCL 4 MG PO TABS: 4.0000 mg | ORAL_TABLET | Freq: Two times a day (BID) | ORAL | Status: AC

## 2012-04-02 NOTE — Telephone Encounter (Signed)
Yes, zofran 56m pills, take one pill twice daily as needed, disp 60 with 3 refills. Thanks

## 2012-04-02 NOTE — Telephone Encounter (Signed)
Pt continues to have vomiting despite small frequent meals, she has only been back on Reglan for 4 days.  Omeprazole twice a day as directed.  Has bloating and constipation.  Pt was advised to use miralax 2 scoops daily until she has bowel movement.  Pt request to have zofran sent to pharmacy.  Can we send this?

## 2012-04-02 NOTE — Telephone Encounter (Signed)
Pt notified zofran sent to her pharmacy she will call back with any further concerns

## 2012-04-29 ENCOUNTER — Ambulatory Visit: Payer: Medicare Other | Admitting: Internal Medicine

## 2012-04-29 ENCOUNTER — Ambulatory Visit (INDEPENDENT_AMBULATORY_CARE_PROVIDER_SITE_OTHER): Payer: Medicare Other | Admitting: Internal Medicine

## 2012-04-29 ENCOUNTER — Encounter: Payer: Self-pay | Admitting: Internal Medicine

## 2012-04-29 VITALS — BP 168/98 | HR 95 | Ht 62.0 in | Wt 218.2 lb

## 2012-04-29 DIAGNOSIS — J45909 Unspecified asthma, uncomplicated: Secondary | ICD-10-CM

## 2012-04-29 DIAGNOSIS — G4730 Sleep apnea, unspecified: Secondary | ICD-10-CM

## 2012-04-29 DIAGNOSIS — G4710 Hypersomnia, unspecified: Secondary | ICD-10-CM

## 2012-04-29 MED ORDER — HYDROCOD POLST-CHLORPHEN POLST 10-8 MG/5ML PO LQCR: 5.0000 mL | Freq: Two times a day (BID) | ORAL | Status: DC | PRN

## 2012-04-29 MED ORDER — NEBULIZER COMPRESSOR MISC: Status: AC

## 2012-04-29 NOTE — Patient Instructions (Addendum)
Script for tussionex cough syrup  Script to replace worn out nebulizer machine- see [place] to help get this set up with the same DME company that provides your CPAP  Please call as needed

## 2012-04-29 NOTE — Progress Notes (Signed)
Subjective:    Patient ID: Cheryl [name], female    DOB: 04/16/1966, 46 y.o.   MRN: [id number]  Asthma Her past medical history is significant for asthma.   June 24, 2010- Allergic rhinitis, OSA, DM, GERD..........................[name] here  Nurse-CC: 4 month follow up visit-wheezing, few asthma attacks, sneezing(using nasal sprays). Sleep- waking up "alot" and other days sleeping all day and not able to wake up well enough.  Now on cefdinir for sinusitis. Sneezing all Fall. Had CT sinus at [facility]- "chronic sinusitis".  Mother reports fall 2 weeks ago- fell onto knees and hands when knees buckled. Cheryl [name] now when she stands. Evaluated at [facility]. Anticipate referral to [facility] neurology. Had flu vax. Still lives same house.  Continues CPAP. When too tight she got pressure marks- discussed. Not smoking. Getting divorced- looking at options.   August 28, 2010-Allergic rhinitis, Asthma, OSA, DM, GERD.............[name] here  Nurse-CC: Follow up per Cheryl Hoehn, FNP. Pt c/o chest pressure and tightness, pains in chest when breathing, cough with very little yellow mucus, increased SOB at rest and with activity x 1 wk.  CPAP 10- Usually using it all night every night. Has had to skip this week while ill.  Says she has been on abx since October for sinusitis- did CT sinus. Gets better then worse. In last 1-2 weeks, increased cough and wheeze , tussive soreness mid anterior and left lateral chest. CBC and CXR at [name]. Says 2 days ago WBC 14000, given nebs. Now on doxy 259m daily.  [facility].   11/11/10- Acute OV-Walk in  Complains of increased SOB, wheezing, dry cough, chest congestion, low grade temp x2days. Has a lot of sinus drainage, cough, post nasal drip and wheezing. Wears CPAP at night, no interference. Teeth hurt at  at times. Has fatigue and no energy.  Has been taking zyrtec without much help. Very dry in sinuses. Frontal headache.    12/27/10- Allergic rhinitis, Asthma, OSA, complicated by DM, GERD  Mother here After seeing TP in April got welll after 2 rounds of omnicef. Tongue feels funny. Diabetic control has not been good.  Breathing has been good.   07/08/11- 89YOF former smoker, followed for asthma, allergic rhinitis, OSA/ failed CPAP, complicated by DM, GERD.   Mother here and offers her observations.  Says she felt okay until yesterday. She couldn't walk well, was falling and had gone back to using her walker. They called 911 and they told her she was okay except glucose was high. Mother wonders about "inner ear"/vertigo. Treated with eardrops in October for otitis left ear. Has Bell's palsy off and on causing some weakness in the left lower face which other associates with episodes of viral illness such as bronchitis, or allergy. [name], [name], with a total of 3 rounds of antibiotics this fall.  She now says that cough began about the time she began ramipril from Dr KDwyane Dee    04/29/12- 89YOF former smoker, followed for asthma, allergic rhinitis, OSA/ failed CPAP, complicated by DM, GERD.  Daughter here.  Has had flu shot. Cough, wheezing x 4 days; states friend of hers passed last Friday with MRSA.  Recent cold with increased wheeze, cough, sweating, hoarseness. Reports temperature 101. Scant sputum was green and nasal discharge green  but no headache. 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  Review of Systems- see HPI Constitutional:   No-   weight loss, night sweats, fevers, chills,+ fatigue, lassitude. HEENT:   No-headaches, difficulty swallowing, tooth/dental problems, +sore throat,       No-  sneezing, itching, +ear ache, nasal congestion,  post nasal drip,  CV:  No- chest pain, orthopnea, PND, swelling in lower extremities, anasarca,  dizziness, palpitations Resp: No-   shortness of breath with exertion or at rest.              +  productive cough,  + non-productive cough,  No- coughing up of blood.              +  change in color of mucus.  No- wheezing.   Skin: No-   rash or lesions. GI:  No-   heartburn, indigestion, abdominal pain, nausea, vomiting,  GU:  MS:  No-   joint pain or swelling.   Neuro-     nothing unusual Psych:  No- change in mood or affect. No depression or anxiety.  No memory loss.  Objective:   Physical Exam General- Alert, Oriented, Affect-appropriate, Distress- none acute. Overweight Skin- rash-none, lesions- none, excoriation- none Lymphadenopathy- none Head- atraumatic            Eyes- Gross vision intact, PERRLA, conjunctivae clear secretions. No nystagmus            Ears- Hearing, canals-normal- no visible problem.            Nose- Clear, no-Septal dev, mucus, polyps, erosion, perforation             Throat- Mallampati II , +mucosa red , drainage- none, tonsils- atrophic. +Voice strained/ hoarse. Neck- flexible , trachea midline, no stridor , thyroid nl, carotid no bruit Chest - symmetrical excursion , unlabored           Heart/CV- RRR , no murmur , no gallop  , no rub, nl s1 s2                           - JVD- none , edema- none, stasis changes- none, varices- none           Lung- clear to P&A/ no rhonchi/unlabored, wheeze- none, + dry paroxysmal cough , dullness-none, rub- none           Chest wall-  Abd- Br/ Gen/ Rectal- Not done, not indicated Extrem- cyanosis- none, clubbing, none, atrophy- none, strength-not assessed. Neuro- grossly intact to observation- using walker, no tremor

## 2012-04-30 ENCOUNTER — Telehealth: Payer: Self-pay | Admitting: Internal Medicine

## 2012-04-30 ENCOUNTER — Encounter: Payer: Self-pay | Admitting: Critical Care Medicine

## 2012-04-30 MED ORDER — ALBUTEROL SULFATE HFA 108 (90 BASE) MCG/ACT IN AERS: 2.0000 | INHALATION_SPRAY | Freq: Four times a day (QID) | RESPIRATORY_TRACT | Status: DC | PRN

## 2012-04-30 MED ORDER — ALBUTEROL SULFATE (2.5 MG/3ML) 0.083% IN NEBU: 2.5000 mg | INHALATION_SOLUTION | Freq: Four times a day (QID) | RESPIRATORY_TRACT | Status: DC | PRN

## 2012-04-30 NOTE — Telephone Encounter (Signed)
Returning call from pcc can be reached at [phone].Elnita Maxwell

## 2012-04-30 NOTE — Telephone Encounter (Signed)
Called, spoke with pt.  Requesting albuterol hfa and albuterol nebs rxs to be sent to [facility] -- rxs sent.  Pt aware.  Also, reports [facility] came out yesterday to bring neb machine.  They were not aware of order being sent for cpap yesterday as well.  PCCs, looks like order was sent yesterday.  Will you pls check on this?  Pt states they did not have the order.  Thank you.

## 2012-05-03 NOTE — Telephone Encounter (Signed)
Pt aware that the driver did not know about the cpap [facility] did get cpap order and will be contacting her Cheryl [name]

## 2012-05-06 ENCOUNTER — Ambulatory Visit (INDEPENDENT_AMBULATORY_CARE_PROVIDER_SITE_OTHER)
Admission: RE | Admit: 2012-05-06 | Discharge: 2012-05-06 | Disposition: A | Discharge status: Home / Self Care | Payer: Medicare Other | Source: Ambulatory Visit | Attending: Internal Medicine | Admitting: Internal Medicine

## 2012-05-06 ENCOUNTER — Telehealth: Payer: Self-pay | Admitting: Internal Medicine

## 2012-05-06 DIAGNOSIS — R059 Cough, unspecified: Secondary | ICD-10-CM

## 2012-05-06 DIAGNOSIS — R05 Cough: Secondary | ICD-10-CM

## 2012-05-06 NOTE — Telephone Encounter (Signed)
Dr Ernesto Rutherford suggests she has "the same bug that's going around", but considers her fragile. He started Z pak and asks CXR. P- ordering CXR

## 2012-05-06 NOTE — Progress Notes (Signed)
Quick Note:  Pt aware of results. ______ 

## 2012-05-06 NOTE — Telephone Encounter (Signed)
Pt is requesting CXR results from today. Please advise Dr. [name] thanks

## 2012-05-06 NOTE — Telephone Encounter (Signed)
CY spoke with [facility] office; order for CXR placed and patient aware.

## 2012-05-06 NOTE — Telephone Encounter (Signed)
Result Note     CXR- normal. Lungs are clear.      Pt is aware of results.

## 2012-05-07 ENCOUNTER — Telehealth: Payer: Self-pay | Admitting: Internal Medicine

## 2012-05-07 ENCOUNTER — Ambulatory Visit: Payer: Medicare Other | Admitting: Gastroenterology

## 2012-05-07 NOTE — Telephone Encounter (Signed)
Dr. [name] have you seen these papers on this pt. Please advise thanks

## 2012-05-08 NOTE — Assessment & Plan Note (Signed)
Compliance has been better except when she has respiratory infection.

## 2012-05-08 NOTE — Assessment & Plan Note (Addendum)
Acute exacerbation consistent with community-acquired viral pattern upper respiratory infection/tracheobronchitis.  plan-try to delay use of antibiotics again possible. Encourage fluids, voice rest. We're replacing worn out nebulizer machine. DOT form completed.

## 2012-05-09 NOTE — Telephone Encounter (Signed)
Form has been completed.

## 2012-05-10 NOTE — Telephone Encounter (Signed)
Papers filled out and given to me by CY; I have called the patient; aware that papers are at front for pick up-copy made to keep on file with our office as well.

## 2012-05-21 ENCOUNTER — Telehealth: Payer: Self-pay | Admitting: Gastroenterology

## 2012-05-21 NOTE — Telephone Encounter (Signed)
Pt has appt for 05/25/12 830 am

## 2012-05-25 ENCOUNTER — Encounter: Payer: Self-pay | Admitting: Gastroenterology

## 2012-05-25 ENCOUNTER — Ambulatory Visit (INDEPENDENT_AMBULATORY_CARE_PROVIDER_SITE_OTHER): Payer: Medicare Other | Admitting: Gastroenterology

## 2012-05-25 VITALS — BP 106/72 | HR 64 | Ht 62.0 in | Wt 218.8 lb

## 2012-05-25 DIAGNOSIS — R1310 Dysphagia, unspecified: Secondary | ICD-10-CM

## 2012-05-25 DIAGNOSIS — E669 Obesity, unspecified: Secondary | ICD-10-CM

## 2012-05-25 NOTE — Patient Instructions (Addendum)
You need to be eating 4-5 times per day rather than just one meal a day. You stomach does not empty well from diabetic related slow stomach plus possible narcotic medicines. Stop the reglan since it doesn't seem like it is helping. Barium esophagram for your trouble swallowing.  You have been scheduled for a Barium Esophogram at [facility] (1st floor of the hospital) on 05/27/12 at 930 am. Please arrive 15 minutes prior to your appointment for registration. Make certain not to have anything to eat or drink 6 hours prior to your test. If you need to reschedule for any reason, please contact radiology at [phone] to do so. __________________________________________________________________ A barium swallow is an examination that concentrates on views of the esophagus. This tends to be a double contrast exam (barium and two liquids which, when combined, create a gas to distend the wall of the oesophagus) or single contrast (non-ionic iodine based). The study is usually tailored to your symptoms so a good history is essential. Attention is paid during the study to the form, structure and configuration of the esophagus, looking for functional disorders (such as aspiration, dysphagia, achalasia, motility and reflux) EXAMINATION You may be asked to change into a gown, depending on the type of swallow being performed. A radiologist and radiographer will perform the procedure. The radiologist will advise you of the type of contrast selected for your procedure and direct you during the exam. You will be asked to stand, sit or lie in several different positions and to hold a small amount of fluid in your mouth before being asked to swallow while the imaging is performed .In some instances you may be asked to swallow barium coated marshmallows to assess the motility of a solid food bolus. The exam can be recorded as a digital or video fluoroscopy procedure. POST PROCEDURE It will take 1-2 days for the  barium to pass through your system. To facilitate this, it is important, unless otherwise directed, to increase your fluids for the next 24-48hrs and to resume your normal diet.  This test typically takes about 30 minutes to perform. __________________________________________________________________________________ We will track down your previous colonoscopy report. Need to lose weight, you have gained 5 pounds in the past 2 months. Start miralax, one scoop once daily for your constipation. Return office visit in 6-7 weeks (hopefully your URI will have passed by then).

## 2012-05-25 NOTE — Progress Notes (Signed)
Review of pertinent gastrointestinal problems:  1. GERD. EGD, July 2008, showed no esophagitis or Barrett's. Bravo pH study performed while staying on Zegerid, showed good control of acid while on medicine.  2. Gastroparesis. EGD, July 2008, showed a large amount of retained food in stomach. She has diabetes and is on chronic narcotics(Vicodin daily). These are likely contributing to her gastroparesis. Gastroparesis in turn can contribute to reflux.   02/2012 restarted reglan 62m QHS, smaller more frequent meals.  She had cut out narcs.   HPI: This is a    a 46year old woman who is here with her mother today. I last saw her about 2 months ago.  She has gained 5 pounds in the past 2 months!  She has been having cough, wheezing for 5 weeks.  She has been needing tussinex several times a day, more recently tesselon pearls.   She tells me today that she has been having trouble with constipation and sore throat, difficulty swallowing. This seems like it is getting worse.    She tells me she has swallowing difficulty today. Food seems like is getting caught in the back of her throat, upper esophagus.   Past Medical History  Diagnosis Date  . GERD (gastroesophageal reflux disease)   . Sleep apnea   . Chronic airway obstruction, not elsewhere classified   . Asthma   . Hyperlipidemia   . Hypothyroidism   . Fibromyalgia   . Depression   . Diabetes mellitus   . Gastroparesis     from DM and chronic narcotic use  . DDD (degenerative disc disease)     CERVIAL AND LUMBAR  . Edema   . Human parvovirus infection   . 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 Fatty liver disease, nonalcoholic   . Migraine headache   . Positive PPD   . Vitamin D deficiency   . Vocal cord dysfunction     Past Surgical History  Procedure Date  . Vaginal hysterectomy   . Cholecystectomy   . Nasal sinus surgery   . Knee surgery   . Right hip 2002  . Shoulder  surgery     RIGHT  . Cervical disc surgery 2004    Current Outpatient Prescriptions  Medication Sig Dispense Refill  . albuterol (PROVENTIL HFA) 108 (90 BASE) MCG/ACT inhaler Inhale 2 puffs into the lungs every 6 (six) hours as needed.  1 Inhaler  2  . albuterol (PROVENTIL) (2.5 MG/3ML) 0.083% nebulizer solution Take 3 mLs (2.5 mg total) by nebulization every 6 (six) hours as needed.  360 mL  2  . amitriptyline (ELAVIL) 25 MG tablet Take 50 mg by mouth at bedtime.      . bumetanide (BUMEX) 1 MG tablet Take 1 mg by mouth 2 (two) times daily.       . chlorpheniramine-HYDROcodone (TUSSIONEX) 10-8 MG/5ML LQCR Take 5 mLs by mouth every 12 (twelve) hours as needed.  140 mL  0  . dextrose 5 % SOLN 250 mL with acyclovir 50 MG/ML SOLN Take by mouth as needed.       . diazepam (VALIUM) 5 MG tablet Take 5 mg by mouth 2 (two) times daily.       . diclofenac (VOLTAREN) 50 MG EC tablet Take 50 mg by mouth 2 (two) times daily as needed.       . diltiazem (CARDIZEM CD) 240 MG 24 hr capsule Take 240 mg by mouth daily.        .[name]  gabapentin (NEURONTIN) 800 MG tablet Take 800 mg by mouth. Take 4 (four) times a day      . insulin glargine (LANTUS) 100 UNIT/ML injection Inject into the skin as directed. 90 units in the am  70 units in the pm      . Insulin Regular Human (HUMULIN R IJ) Inject as directed. 5 units every 6 hours      . ipratropium-albuterol (DUONEB) 0.5-2.5 (3) MG/3ML SOLN Take 3 mLs by nebulization 4 (four) times daily.        [name] levothyroxine (SYNTHROID, LEVOTHROID) 112 MCG tablet Take 112 mcg by mouth daily.        . meclizine (ANTIVERT) 25 MG tablet Take 25 mg by mouth as needed.        . metFORMIN (GLUCOPHAGE) 500 MG tablet Take 500 mg by mouth 2 (two) times daily with a meal.      . nebivolol (BYSTOLIC) 10 MG tablet Take 5 mg by mouth daily.       . Nebulizers (NEBULIZER COMPRESSOR) MISC Use up to 4 times daily as needed  1 each  0  . Omega-3-acid Ethyl Esters (LOVAZA PO) Take by mouth as  directed.      [name] omeprazole (PRILOSEC) 40 MG capsule Take 40 mg by mouth 2 (two) times daily.       . ondansetron (ZOFRAN) 4 MG tablet Take 4 mg by mouth every 8 (eight) hours as needed.      . rosuvastatin (CRESTOR) 40 MG tablet Take 40 mg by mouth daily.       . SUMAtriptan (IMITREX) 100 MG tablet Take 100 mg by mouth as needed.        [name] tiZANidine (ZANAFLEX) 4 MG capsule Take 4 mg by mouth 2 (two) times daily.        Allergies as of 05/25/2012 - Review Complete 05/25/2012  Allergen Reaction Noted  . Biaxin (clarithromycin) Hives 10/23/2010  . Nucynta (tapentadol hydrochloride) Other (See Comments) 10/23/2010  . Vilazodone hcl Other (See Comments) 10/23/2010  . Amoxicillin    . Ciprofloxacin  04/09/2009  . Iohexol  07/30/2005  . Levofloxacin    . Pregabalin    . Sulfonamide derivatives  04/09/2009  . Percocet (oxycodone-acetaminophen) Rash 10/23/2010    Family History  Problem Relation Age of Onset  . Coronary artery disease Father     History   Social History  . Marital Status: Legally Separated    Spouse Name: N/A    Number of Children: N/A  . Years of Education: N/A   Occupational History  . Not on file.   Social History Main Topics  . Smoking status: Former Smoker -- 0.1 packs/day for 3 years  . Smokeless tobacco: Never Used  . Alcohol Use: No  . Drug Use: No  . Sexually Active: Not on file   Other Topics Concern  . Not on file   Social History Narrative  . No narrative on file      Physical Exam: BP 106/72  Pulse 64  Ht 5' 2"  (1.575 m)  Wt 218 lb 12.8 oz (99.247 kg)  BMI 40.02 kg/m2 Constitutional: Obese, she speaks with a very weak voice, walks with a walker Psychiatric: alert and oriented x3 Abdomen: soft, nontender, nondistended, no obvious ascites, no peritoneal signs, normal bowel sounds Oral pharynx with erythema posteriorly    Assessment and plan: 46 y.o. female with current upper respirations infection; obesity, constipation,  dysphagia   She is eating only one large meal  a day, she has gained 5 pounds in 2 months and so I suspect it is a pretty large meal. I explained to her again that her stomach just does not function well, likely from the diabetes and also from her narcotic use periodically (most recently tussinex).  She absolutely needs to eat 4-5 small meals a day rather than one very large one. I've also recommended she start taking MiraLax powder once daily. She is going to have a barium esophagram to evaluate her dysphagia but I feel this is probably more related to her upper respiratory infection. She'll return to see me in her and 45 weeks and sooner if needed.

## 2012-05-26 ENCOUNTER — Other Ambulatory Visit: Payer: Self-pay | Admitting: Family Medicine

## 2012-05-26 DIAGNOSIS — Z1231 Encounter for screening mammogram for malignant neoplasm of breast: Secondary | ICD-10-CM

## 2012-05-27 ENCOUNTER — Ambulatory Visit (HOSPITAL_COMMUNITY)
Admission: RE | Admit: 2012-05-27 | Discharge: 2012-05-27 | Disposition: A | Discharge status: Home / Self Care | Payer: Medicare Other | Source: Ambulatory Visit | Attending: Gastroenterology | Admitting: Gastroenterology

## 2012-05-27 DIAGNOSIS — R079 Chest pain, unspecified: Secondary | ICD-10-CM | POA: Insufficient documentation

## 2012-05-27 DIAGNOSIS — E669 Obesity, unspecified: Secondary | ICD-10-CM

## 2012-05-27 DIAGNOSIS — R110 Nausea: Secondary | ICD-10-CM | POA: Insufficient documentation

## 2012-05-27 DIAGNOSIS — R1310 Dysphagia, unspecified: Secondary | ICD-10-CM | POA: Insufficient documentation

## 2012-05-27 DIAGNOSIS — R6889 Other general symptoms and signs: Secondary | ICD-10-CM | POA: Insufficient documentation

## 2012-06-01 ENCOUNTER — Telehealth: Payer: Self-pay | Admitting: Gastroenterology

## 2012-06-01 NOTE — Telephone Encounter (Signed)
colonsocopy Dr. Collene Mares 08/2004, done for blood in stool and change in bowel habits; normal colonoscopy, normal TI, small internal hemorrhoids, noted to have pain with air insufflation; was recommended to have repeat colonoscopy at age 46.

## 2012-06-15 ENCOUNTER — Other Ambulatory Visit (HOSPITAL_COMMUNITY): Payer: Self-pay | Admitting: Family Medicine

## 2012-06-16 ENCOUNTER — Encounter (HOSPITAL_COMMUNITY)
Admission: RE | Admit: 2012-06-16 | Discharge: 2012-06-16 | Disposition: A | Discharge status: Home / Self Care | Payer: Medicare Other | Source: Ambulatory Visit | Attending: Family Medicine | Admitting: Family Medicine

## 2012-06-16 ENCOUNTER — Encounter (HOSPITAL_COMMUNITY): Payer: Self-pay

## 2012-06-16 DIAGNOSIS — IMO0001 Reserved for inherently not codable concepts without codable children: Secondary | ICD-10-CM | POA: Insufficient documentation

## 2012-06-16 LAB — GLUCOSE, CAPILLARY: Glucose-Capillary: 202 mg/dL — ABNORMAL HIGH (ref 70–99)

## 2012-06-16 MED ORDER — SODIUM CHLORIDE 0.9 % IV SOLN
INTRAVENOUS | Status: DC
  Administered 2012-06-16: 09:00:00 via INTRAVENOUS

## 2012-06-16 MED ORDER — LIDOCAINE HCL (PF) 1 % IJ SOLN
INTRAMUSCULAR | Status: DC
  Administered 2012-06-16: 10:00:00 via INTRAVENOUS
  Filled 2012-06-16: qty 250

## 2012-06-16 NOTE — Progress Notes (Signed)
Tolerating well. NSR noted on dinemap. Occassional" flutters', States has these at home

## 2012-06-22 ENCOUNTER — Encounter (HOSPITAL_COMMUNITY): Payer: Medicare Other

## 2012-06-23 ENCOUNTER — Encounter (HOSPITAL_COMMUNITY): Payer: Medicare Other

## 2012-06-28 ENCOUNTER — Ambulatory Visit: Payer: Medicare Other | Admitting: Gastroenterology

## 2012-06-29 ENCOUNTER — Encounter (HOSPITAL_COMMUNITY): Payer: Medicare Other

## 2012-06-30 ENCOUNTER — Ambulatory Visit
Admission: RE | Admit: 2012-06-30 | Discharge: 2012-06-30 | Disposition: A | Discharge status: Home / Self Care | Payer: Medicare Other | Source: Ambulatory Visit | Attending: Family Medicine | Admitting: Family Medicine

## 2012-06-30 ENCOUNTER — Encounter (HOSPITAL_COMMUNITY)
Admission: RE | Admit: 2012-06-30 | Discharge: 2012-06-30 | Disposition: A | Discharge status: Home / Self Care | Payer: Medicare Other | Source: Ambulatory Visit | Attending: Family Medicine | Admitting: Family Medicine

## 2012-06-30 ENCOUNTER — Encounter (HOSPITAL_COMMUNITY): Payer: Self-pay

## 2012-06-30 DIAGNOSIS — Z1231 Encounter for screening mammogram for malignant neoplasm of breast: Secondary | ICD-10-CM

## 2012-06-30 DIAGNOSIS — IMO0001 Reserved for inherently not codable concepts without codable children: Secondary | ICD-10-CM | POA: Insufficient documentation

## 2012-06-30 MED ORDER — SODIUM CHLORIDE 0.9 % IV SOLN
INTRAVENOUS | Status: DC
  Administered 2012-06-30: 10:00:00 via INTRAVENOUS
  Filled 2012-06-30 (×24): qty 250

## 2012-06-30 MED ORDER — SODIUM CHLORIDE 0.9 % IV SOLN
INTRAVENOUS | Status: DC
  Administered 2012-06-30: 09:00:00 via INTRAVENOUS

## 2012-06-30 NOTE — Progress Notes (Signed)
States did well w 1st treatment. Just slept for 2 days after treatment and Dr Maceo Pro !!!

## 2012-07-06 ENCOUNTER — Encounter (HOSPITAL_COMMUNITY): Payer: Medicare Other

## 2012-07-07 ENCOUNTER — Ambulatory Visit: Payer: Medicare Other | Admitting: Gastroenterology

## 2012-07-07 ENCOUNTER — Encounter (HOSPITAL_COMMUNITY): Payer: Medicare Other

## 2012-07-14 ENCOUNTER — Encounter (HOSPITAL_COMMUNITY): Payer: Self-pay

## 2012-07-14 ENCOUNTER — Encounter (HOSPITAL_COMMUNITY)
Admission: RE | Admit: 2012-07-14 | Discharge: 2012-07-14 | Disposition: A | Discharge status: Home / Self Care | Payer: Medicare Other | Source: Ambulatory Visit | Attending: Family Medicine | Admitting: Family Medicine

## 2012-07-14 MED ORDER — SODIUM CHLORIDE 0.9 % IV SOLN
INTRAVENOUS | Status: DC
  Administered 2012-07-14: 12:00:00 via INTRAVENOUS
  Filled 2012-07-14 (×23): qty 250

## 2012-07-14 MED ORDER — SODIUM CHLORIDE 0.9 % IV SOLN
INTRAVENOUS | Status: DC
  Administered 2012-07-14: 12:00:00 via INTRAVENOUS

## 2012-07-15 NOTE — Progress Notes (Signed)
Patient called today and stated, "that Dr. Maceo Pro wants me to cancel all of my appointments for the lidocaine drip." This RN told her I would cancel appts., and verify with MD. I called Dr. Delman Kitten office, and patient was seen today. He does want all appointments cancelled at this time for lidocaine drip. If he restarts the lidocaine drip, he will fax over new orders and let us know.

## 2012-07-20 ENCOUNTER — Encounter: Payer: Self-pay | Admitting: Gastroenterology

## 2012-07-20 ENCOUNTER — Other Ambulatory Visit (INDEPENDENT_AMBULATORY_CARE_PROVIDER_SITE_OTHER): Payer: Medicare Other

## 2012-07-20 ENCOUNTER — Ambulatory Visit (INDEPENDENT_AMBULATORY_CARE_PROVIDER_SITE_OTHER): Payer: Medicare Other | Admitting: Gastroenterology

## 2012-07-20 VITALS — BP 116/76 | HR 84 | Ht 62.0 in | Wt 224.0 lb

## 2012-07-20 DIAGNOSIS — R109 Unspecified abdominal pain: Secondary | ICD-10-CM

## 2012-07-20 LAB — SEDIMENTATION RATE: Sed Rate: 19 mm/hr (ref 0–22)

## 2012-07-20 LAB — CBC WITH DIFFERENTIAL/PLATELET
Basophils Absolute: 0.1 10*3/uL (ref 0.0–0.1)
Basophils Relative: 1.2 % (ref 0.0–3.0)
Eosinophils Absolute: 0.8 10*3/uL — ABNORMAL HIGH (ref 0.0–0.7)
Eosinophils Relative: 7.2 % — ABNORMAL HIGH (ref 0.0–5.0)
HCT: 35.2 % — ABNORMAL LOW (ref 36.0–46.0)
Hemoglobin: 12 g/dL (ref 12.0–15.0)
Lymphocytes Relative: 29.9 % (ref 12.0–46.0)
Lymphs Abs: 3.2 10*3/uL (ref 0.7–4.0)
MCHC: 34 g/dL (ref 30.0–36.0)
MCV: 83.7 fl (ref 78.0–100.0)
Monocytes Absolute: 0.7 10*3/uL (ref 0.1–1.0)
Monocytes Relative: 6.3 % (ref 3.0–12.0)
Neutro Abs: 6 10*3/uL (ref 1.4–7.7)
Neutrophils Relative %: 55.4 % (ref 43.0–77.0)
Platelets: 321 10*3/uL (ref 150.0–400.0)
RBC: 4.21 Mil/uL (ref 3.87–5.11)
RDW: 16.2 % — ABNORMAL HIGH (ref 11.5–14.6)
WBC: 10.8 10*3/uL — ABNORMAL HIGH (ref 4.5–10.5)

## 2012-07-20 LAB — COMPREHENSIVE METABOLIC PANEL
ALT: 30 U/L (ref 0–35)
AST: 31 U/L (ref 0–37)
Albumin: 4.1 g/dL (ref 3.5–5.2)
Alkaline Phosphatase: 82 U/L (ref 39–117)
BUN: 13 mg/dL (ref 6–23)
CO2: 28 mEq/L (ref 19–32)
Calcium: 9.8 mg/dL (ref 8.4–10.5)
Chloride: 101 mEq/L (ref 96–112)
Creatinine, Ser: 0.9 mg/dL (ref 0.4–1.2)
GFR: 68.24 mL/min (ref >60.00)
Glucose, Bld: 275 mg/dL — ABNORMAL HIGH (ref 70–99)
Potassium: 4.8 mEq/L (ref 3.5–5.1)
Sodium: 135 mEq/L (ref 135–145)
Total Bilirubin: 0.7 mg/dL (ref 0.3–1.2)
Total Protein: 7.3 g/dL (ref 6.0–8.3)

## 2012-07-20 NOTE — Patient Instructions (Addendum)
Again, you should be eating 4-5 small meals a day rather than 1 meal per day. You need to cut back on total calories since you have gained 6 pounds in past 2 months. Cut back on whatever it is that is causing extra calories input. You will have labs checked today in the basement lab.  Please head down after you check out with the front desk  (cbc, [email or web address]). You should change the way you are taking your antiacid medicine (prilosec) so that you are taking it 20-30 minutes prior to a decent meal as that is the way the pill is designed to work most effectively. Return to see Dr. Ardis Hughs as needed.

## 2012-07-20 NOTE — Progress Notes (Signed)
Review of pertinent gastrointestinal problems:  1. GERD. EGD, July 2008, showed no esophagitis or Barrett's. Bravo pH study performed while staying on Zegerid, showed good control of acid while on medicine.  2. Gastroparesis. EGD, July 2008, showed a large amount of retained food in stomach. She has diabetes and is on chronic narcotics(Vicodin daily). These are likely contributing to her gastroparesis. Gastroparesis in turn can contribute to reflux. 02/2012 restarted reglan 97m QHS, smaller more frequent meals. 04/2012 was back to eating 1 meal a day, back on narcotics (for chronic cough). 3. Routine risk for colon cancer:  colonsocopy Dr. MCollene Mares2/2006, done for blood in stool and change in bowel habits; normal colonoscopy, normal TI, small internal hemorrhoids, noted to have pain with air insufflation; was recommended to have repeat colonoscopy at age 46(2018) 4. Intermittent dysphagia;  05/2012 barium esophagram was normal.    HPI: This is a 46year old woman whom I last saw about 2 months ago. Since then she has gained 6 pounds. Prior to that she gained 5 pounds in 2 months.    Hasn't felt good "at all".  Stomach hurting, bloated. Has been wheezing a lot.  Feeling very fatigued.  Has been trying lidocaine infusions for her fibromylagia, chronic pains, neuralgia.  Fingers feel tight. She feels swollen.  Swallowing has been OK.  Still with periodic dysphagia despite normal barium esophagram.  "eating has not been going to well" but her weight is up 6 pounds in two months.  But all she does is "drink grape juice."  Usually only eats one meal a day.  I recommended at her last visit that she eat smaller more frequent meals but she has not taken the advice  Most recently Hb a1c ws 7.7  Has been taking miralax regularly.  This "helps."    Past Medical History  Diagnosis Date  . GERD (gastroesophageal reflux disease)   . Sleep apnea   . Chronic airway obstruction, not elsewhere classified    . Asthma   . Hyperlipidemia   . Hypothyroidism   . Fibromyalgia   . Depression   . Diabetes mellitus   . Gastroparesis     from DM and chronic narcotic use  . DDD (degenerative disc disease)     CERVIAL AND LUMBAR  . Edema   . Human parvovirus infection   . 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 Fatty liver disease, nonalcoholic   . Migraine headache   . Positive PPD   . Vitamin D deficiency   . Vocal cord dysfunction     Past Surgical History  Procedure Date  . Vaginal hysterectomy   . Cholecystectomy   . Nasal sinus surgery   . Knee surgery   . Right hip 2002  . Shoulder surgery     RIGHT  . Cervical disc surgery 2004    Current Outpatient Prescriptions  Medication Sig Dispense Refill  . albuterol (PROVENTIL HFA) 108 (90 BASE) MCG/ACT inhaler Inhale 2 puffs into the lungs every 6 (six) hours as needed.  1 Inhaler  2  . albuterol (PROVENTIL) (2.5 MG/3ML) 0.083% nebulizer solution Take 3 mLs (2.5 mg total) by nebulization every 6 (six) hours as needed.  360 mL  2  . amitriptyline (ELAVIL) 25 MG tablet Take 25 mg by mouth at bedtime.       . bumetanide (BUMEX) 1 MG tablet Take 1 mg by mouth 2 (two) times daily.       .[name]  chlorpheniramine-HYDROcodone (TUSSIONEX) 10-8 MG/5ML LQCR Take 5 mLs by mouth every 12 (twelve) hours as needed.  140 mL  0  . dextrose 5 % SOLN 250 mL with acyclovir 50 MG/ML SOLN Take by mouth as needed.       . diazepam (VALIUM) 5 MG tablet Take 5 mg by mouth 2 (two) times daily.       . diclofenac (VOLTAREN) 50 MG EC tablet Take 50 mg by mouth 2 (two) times daily as needed.       . diltiazem (CARDIZEM CD) 240 MG 24 hr capsule Take 240 mg by mouth daily.        [name] gabapentin (NEURONTIN) 800 MG tablet Take 800 mg by mouth. Take 4 (four) times a day      . insulin glargine (LANTUS) 100 UNIT/ML injection Inject into the skin as directed. 90 units in the am  60 units in the pm      . Insulin Regular Human  (HUMULIN R IJ) Inject as directed. 5 units every 6 hours      . ipratropium-albuterol (DUONEB) 0.5-2.5 (3) MG/3ML SOLN Take 3 mLs by nebulization 4 (four) times daily.        [name] levothyroxine (SYNTHROID, LEVOTHROID) 112 MCG tablet Take 112 mcg by mouth daily.        . meclizine (ANTIVERT) 25 MG tablet Take 25 mg by mouth as needed.        . metFORMIN (GLUCOPHAGE) 500 MG tablet Take 500 mg by mouth 2 (two) times daily with a meal.      . nebivolol (BYSTOLIC) 10 MG tablet Take 5 mg by mouth daily.       . Nebulizers (NEBULIZER COMPRESSOR) MISC Use up to 4 times daily as needed  1 each  0  . Omega-3-acid Ethyl Esters (LOVAZA PO) Take by mouth as directed.      [name] omeprazole (PRILOSEC) 40 MG capsule Take 40 mg by mouth 2 (two) times daily.       . ondansetron (ZOFRAN) 4 MG tablet Take 4 mg by mouth every 8 (eight) hours as needed.      . rosuvastatin (CRESTOR) 40 MG tablet Take 40 mg by mouth daily.       . SUMAtriptan (IMITREX) 100 MG tablet Take 100 mg by mouth as needed.        [name] tiZANidine (ZANAFLEX) 4 MG capsule Take 4 mg by mouth 2 (two) times daily.        Allergies as of 07/20/2012 - Review Complete 07/20/2012  Allergen Reaction Noted  . Biaxin (clarithromycin) Hives 10/23/2010  . Nucynta (tapentadol hydrochloride) Other (See Comments) 10/23/2010  . Vilazodone hcl Other (See Comments) 10/23/2010  . Amoxicillin    . Ciprofloxacin  04/09/2009  . Iohexol  07/30/2005  . Levofloxacin    . Pregabalin    . Sulfonamide derivatives  04/09/2009  . Percocet (oxycodone-acetaminophen) Rash 10/23/2010    Family History  Problem Relation Age of Onset  . Coronary artery disease Father     History   Social History  . Marital Status: Legally Separated    Spouse Name: N/A    Number of Children: N/A  . Years of Education: N/A   Occupational History  . Not on file.   Social History Main Topics  . Smoking status: Former Smoker -- 0.1 packs/day for 3 years  . Smokeless tobacco: Never Used   . Alcohol Use: No  . Drug Use: No  . Sexually Active: Not on file  Other Topics Concern  . Not on file   Social History Narrative  . No narrative on file      Physical Exam: BP 116/76  Pulse 84  Ht 5' 2"  (1.575 m)  Wt 224 lb (101.606 kg)  BMI 40.97 kg/m2 Constitutional: Chronically ill-appearing, obese Psychiatric: alert and oriented x3 Abdomen: soft, nontender, nondistended, no obvious ascites, no peritoneal signs, normal bowel sounds     Assessment and plan: 46 y.o. female with chronic gastrointestinal complaints, worsening obesity  She's gained 11 pounds in the past 4 months. I think it is unlikely that anything serious is going on.  She continues to eat one meal a day and insists is not much overall calories however she has gained another 6 pounds in past 4 months. Explained to her that she must be taking in extra calories since she is continuing to gain such weight. I also explained to her again she has a slow stomach and that eating one meal a day and is likely causing a lot of her upper intestinal discomforts I again recommended that she 4-5 smaller meals a day. She can return to see me on an as-needed basis.

## 2012-07-22 ENCOUNTER — Encounter (HOSPITAL_COMMUNITY): Payer: Medicare Other

## 2012-07-29 ENCOUNTER — Encounter (HOSPITAL_COMMUNITY): Payer: Medicare Other

## 2012-08-05 ENCOUNTER — Telehealth: Payer: Self-pay | Admitting: Internal Medicine

## 2012-08-05 DIAGNOSIS — J383 Other diseases of vocal cords: Secondary | ICD-10-CM

## 2012-08-05 DIAGNOSIS — J45909 Unspecified asthma, uncomplicated: Secondary | ICD-10-CM

## 2012-08-05 NOTE — Telephone Encounter (Signed)
Per CY   Ok to send order to [facility]   To reduce cpap to 9.  Order has been sent and pt is aware. i called and spoke with pt and she is aware that the order has been sent, but pt wanted to know what this means since her cpap is being decreased.  Does this mean her breathing is worse or better?  Pt is aware that we will have to send this back to CY and she will get a call back tomorrow.  CY please advise. thanks

## 2012-08-05 NOTE — Telephone Encounter (Signed)
Spoke with Cheryl [name] and she states that [facility] is waiting on Dr Janee Morn feedback on Cheryl [name]'s cpap download.  Checked with Joellen Jersey and she doesn't have this.  Spoke with Darrick Penna at [place] and she is Pensions consultant for Dr Janee Morn review.    Also. Cheryl [name] c/o wheezing in throat worse at night.  Denies SOB or chest tightness but feels like she is unable to exhale completely.  Cheryl [name] is not sure if this is coming from her vocal cord dysfunction.  Please advise. Last ov:04-29-12     Next ov:  04-29-13

## 2012-08-06 NOTE — Telephone Encounter (Signed)
Per CY-not worse or better; just seeing if this helps her comfort.

## 2012-08-06 NOTE — Telephone Encounter (Signed)
Spoke with pt and notified of recs per CDY She verbalized understanding and states nothing further needed

## 2012-08-25 ENCOUNTER — Telehealth: Payer: Self-pay | Admitting: Internal Medicine

## 2012-08-25 NOTE — Telephone Encounter (Signed)
I spoke with pt. She stated she will have to pay $400 to get a new CPAP machine bc she has not worn CPAP machine in 90 days. Per pt she is being told she needs a new sleep study. She is also c/o of still "wheezing" in her throat. Please advise Dr. [name] thanks Last OV 04/29/12 Pending OV 04/29/13

## 2012-08-25 NOTE — Telephone Encounter (Signed)
She has not been using CPAP, although we signed the [employer] papers before. It is no wonder that they require her to start over. We can see to create visit documentation and discuss alternatives.

## 2012-08-26 NOTE — Telephone Encounter (Signed)
Per Joellen Jersey, ok to use the 2.10.14 11:15am spot.  Called spoke with patient, advised of CY's recs as stated below.  Pt okay with appt date and time - ov scheduled.  Pt aware to call for sooner follow up if needed.  Will sign off.

## 2012-08-27 ENCOUNTER — Telehealth: Payer: Self-pay | Admitting: *Deleted

## 2012-08-27 ENCOUNTER — Telehealth: Payer: Self-pay | Admitting: Internal Medicine

## 2012-08-27 NOTE — Telephone Encounter (Signed)
After looking at the patient's upcoming appointments, she was scheduled to see CY on 09/06/12 not 08/30/12. I called the pt back to let her know this and she wants to keep her appointment on 09/06/12. She was confused about the dates.  I have canceled the appointment I made for her on 09/28/12.

## 2012-08-27 NOTE — Telephone Encounter (Signed)
Pt is needing to reschedule her appointment from 08/30/12 due her daughter who is her transportation, will not be able to bring her.  I have rescheduled her to 09/28/12 @ 10:45am. Pt was needing a morning appointment and that was the first available CY had, pt was fine with this date and time.

## 2012-09-06 ENCOUNTER — Ambulatory Visit (INDEPENDENT_AMBULATORY_CARE_PROVIDER_SITE_OTHER): Payer: Medicare Other | Admitting: Internal Medicine

## 2012-09-06 ENCOUNTER — Encounter: Payer: Self-pay | Admitting: Internal Medicine

## 2012-09-06 ENCOUNTER — Other Ambulatory Visit: Payer: Self-pay | Admitting: Internal Medicine

## 2012-09-06 ENCOUNTER — Telehealth: Payer: Self-pay | Admitting: Internal Medicine

## 2012-09-06 VITALS — BP 124/78 | HR 102 | Ht 62.0 in | Wt 226.4 lb

## 2012-09-06 DIAGNOSIS — J45909 Unspecified asthma, uncomplicated: Secondary | ICD-10-CM

## 2012-09-06 DIAGNOSIS — B370 Candidal stomatitis: Secondary | ICD-10-CM

## 2012-09-06 DIAGNOSIS — G4710 Hypersomnia, unspecified: Secondary | ICD-10-CM

## 2012-09-06 DIAGNOSIS — B3781 Candidal esophagitis: Secondary | ICD-10-CM

## 2012-09-06 MED ORDER — MONTELUKAST SODIUM 10 MG PO TABS: 10.0000 mg | ORAL_TABLET | Freq: Every day | ORAL | Status: DC

## 2012-09-06 MED ORDER — FLUCONAZOLE 150 MG PO TABS: 150.0000 mg | ORAL_TABLET | Freq: Once | ORAL | Status: AC

## 2012-09-06 MED ORDER — LEVALBUTEROL HCL 0.63 MG/3ML IN NEBU
0.6300 mg | INHALATION_SOLUTION | Freq: Once | RESPIRATORY_TRACT | Status: AC
  Administered 2012-09-06: 0.63 mg via RESPIRATORY_TRACT

## 2012-09-06 NOTE — Patient Instructions (Addendum)
Scripts sent for diflucan for thrush, singulair for asthma  Neb xop 0.63   Please call as needed

## 2012-09-06 NOTE — Telephone Encounter (Signed)
I spoke with Cheryl [name]. She was calling to verify the fluconazole. Once a day x 3 days. Nothing further was needed

## 2012-09-06 NOTE — Progress Notes (Signed)
Subjective:    Patient ID: Cheryl [name], female    DOB: 1966-01-11, 47 y.o.   MRN: [id number]  Asthma Her past medical history is significant for asthma.   June 24, 2010- Allergic rhinitis, OSA, DM, GERD..........................[name] here  Nurse-CC: 4 month follow up visit-wheezing, few asthma attacks, sneezing(using nasal sprays). Sleep- waking up "alot" and other days sleeping all day and not able to wake up well enough.  Now on cefdinir for sinusitis. Sneezing all Fall. Had CT sinus at [facility]- "chronic sinusitis".  Mother reports fall 2 weeks ago- fell onto knees and hands when knees buckled. Cheryl [name]. Evaluated at [facility]. Anticipate referral to [facility] neurology. Had flu vax. Still lives same house.  Continues CPAP. When too tight she got pressure marks- discussed. Not smoking. Getting divorced- looking at options.   August 28, 2010-Allergic rhinitis, Asthma, OSA, DM, GERD.............[name] here  Nurse-CC: Follow up per Cheryl Hoehn, FNP. Pt c/o chest pressure and tightness, pains in chest when breathing, cough with very little yellow mucus, increased SOB at rest and with activity x 1 wk.  CPAP 10- Usually using it all night every night. Has had to skip this week while ill.  Says she has been on abx since October for sinusitis- did CT sinus. Gets better then worse. In last 1-2 weeks, increased cough and wheeze , tussive soreness mid anterior and left lateral chest. CBC and CXR at [name]. Says 2 days ago WBC 14000, given nebs. Now on doxy 215m daily.  [facility] uses walk[facility].   11/11/10- Acute OV-Walk in  Complains of increased SOB, wheezing, dry cough, chest congestion, low grade temp x2days. Has a lot of sinus drainage, cough, post nasal drip and wheezing. Wears CPAP at night, no interference. Teeth hurt at  at times. Has fatigue and no energy.  Has been taking zyrtec without much help. Very dry in sinuses. Frontal headache.    12/27/10- Allergic rhinitis, Asthma, OSA, complicated by DM, GERD  Mother here After seeing TP in April got welll after 2 rounds of omnicef. Tongue feels funny. Diabetic control has not been good.  Breathing has been good.   07/08/11- 47YOF former smoker, followed for asthma, allergic rhinitis, OSA/ failed CPAP, complicated by DM, GERD.   Mother here and offers her observations.  Says she felt okay until yesterday. She couldn't walk well, was falling and had gone back to using her walker. They called 911 and they told her she was okay except glucose was high. Mother wonders about "inner ear"/vertigo. Treated with eardrops in October for otitis left ear. Has Bell's palsy off and on causing some weakness in the left lower face which other associates with episodes of viral illness such as bronchitis, or allergy. [name], [name], with a total of 3 rounds of antibiotics this fall.  She now says that cough began about the time she began ramipril from Dr KDwyane Dee    04/29/12- 47YOF former smoker, followed for asthma, allergic rhinitis, OSA/ failed CPAP, complicated by DM, GERD.  Daughter here.  Has had flu shot. Cough, wheezing x 4 days; states friend of hers passed last Friday with MRSA.  Recent cold with increased wheeze, cough, sweating, hoarseness. Reports temperature 101. Scant sputum was green and nasal discharge green  but no headache. 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  09/06/12- 47 YOF former smoker, followed for asthma, allergic rhinitis, OSA/ failed CPAP, complicated by DM, GERD.  Mother here.  Has had flu shot. FOLLOWS [id number] deep cough and wheezing; also states that [employer]PS came picked up CPAP machine due to Medicare and  stating patient not using it for 90 days. Daily wheeze using rescue inhaler or nebulizer to control.no major distress. We discussed ways to provide more stability. Diabetes and persistent thrush interfere with steroid choices. CXR 05/06/12 IMPRESSION:  No evidence of acute cardiopulmonary disease.  Original Report Authenticated By: [name], M.D.  Review of Systems- see HPI Constitutional:   No-   weight loss, night sweats, fevers, chills,+irregular sleep schedule, fatigue, lassitude. HEENT:   No-headaches, difficulty swallowing, tooth/dental problems, +sore throat,       No-  sneezing, itching, +ear ache, nasal congestion, post nasal drip,  CV:  No- chest pain, orthopnea, PND, swelling in lower extremities, anasarca,  dizziness, palpitations Resp: No- acute  shortness of breath with exertion or at rest.              No- productive cough,  + non-productive cough,  No- coughing up of blood.              No- change in color of mucus.  + wheezing.   Skin: No-   rash or lesions. GI:  No-   heartburn, indigestion, abdominal pain, nausea, vomiting,  GU:  MS:  No-   joint pain or swelling.   Neuro-     nothing unusual Psych:  No- change in mood or affect. No depression or anxiety.  No memory loss.  Objective:   Physical Exam General- Alert, Oriented, Affect-appropriate, Distress- none acute. Overweight Skin- rash-none, lesions- none, excoriation- none Lymphadenopathy- none Head- atraumatic            Eyes- Gross vision intact, PERRLA, conjunctivae clear secretions. No nystagmus            Ears- Hearing, canals-normal- no visible problem.            Nose- Clear, no-Septal dev, mucus, polyps, erosion, perforation             Throat- Mallampati II , +mucosa red,+ thrush , drainage- none, tonsils- atrophic. +Voice strained/ hoarse. Neck- flexible , trachea midline, no stridor , thyroid nl, carotid no bruit Chest - symmetrical excursion , unlabored           Heart/CV- RRR , no murmur ,  no gallop  , no rub, nl s1 s2                           - JVD- none , edema- none, stasis changes- none, varices- none           Lung- +unlabored inspiratory and expiratory wheeze, + dry paroxysmal cough , dullness-none, rub- none           Chest wall-  Abd- Br/ Gen/ Rectal- Not done, not indicated Extrem- cyanosis- none, clubbing, none, atrophy- none, strength-not assessed.+rolling walker Neuro- grossly intact to observation- using walker, no tremor

## 2012-09-12 DIAGNOSIS — B3781 Candidal esophagitis: Secondary | ICD-10-CM | POA: Insufficient documentation

## 2012-09-12 DIAGNOSIS — B370 Candidal stomatitis: Secondary | ICD-10-CM | POA: Insufficient documentation

## 2012-09-12 NOTE — Assessment & Plan Note (Signed)
Exacerbation. Probably a combination of viral syndrome, whether, need for more controller therapy. We will try Singulair.  Plan-nebulizer treatment today, Singulair

## 2012-09-12 NOTE — Assessment & Plan Note (Signed)
Plan-we have reemphasized mouth care. Trying to avoid steroids for her asthma. Give Diflucan, #3, after discussion of possible drug interaction.

## 2012-09-12 NOTE — Assessment & Plan Note (Signed)
Unfortunately compliance was not good. She has difficulties with insight and with self-management. We can revisit this issue another time.

## 2012-09-24 ENCOUNTER — Telehealth: Payer: Self-pay | Admitting: Internal Medicine

## 2012-09-24 MED ORDER — DOXYCYCLINE HYCLATE 100 MG PO TABS: ORAL_TABLET | ORAL | Status: DC

## 2012-09-24 NOTE — Telephone Encounter (Signed)
PT c/o prod cough (yellow) for 2 days - Coughing so hard she can barely talk - Hoarseness - Wheezing - Sinus drainage (yellow) - Headaches - Facial tenderness - Using HHN as needed - Pt reports that she cannot come for appt today and is requesting something be called in.  Please advise Last Ov: 09-06-12     Next Ov: 03-07-13 Allergies  Allergen Reactions  . Influenza Vaccines     Throat swelling, "flu" symptoms  . Biaxin (Clarithromycin) Hives  . Nucynta (Tapentadol Hydrochloride) Other (See Comments)    Halucinations  . Vilazodone Hcl Other (See Comments)    Hallucinations  . Amoxicillin   . Ciprofloxacin   . Iohexol      Desc: hives,dyspnea; throat swelling; ok w/ premeds and omnipaque   . Levofloxacin   . Pregabalin   . Sulfonamide Derivatives   . Percocet (Oxycodone-Acetaminophen) Rash

## 2012-09-24 NOTE — Telephone Encounter (Signed)
PT notified of Dr Janee Morn instructions and that med was sent to pharmacy.

## 2012-09-24 NOTE — Telephone Encounter (Signed)
Per CY---  Doxycycline 100 mg   #8  2 today then 1 daily until gone mucinex DM

## 2012-09-28 ENCOUNTER — Ambulatory Visit: Payer: Medicare Other | Admitting: Internal Medicine

## 2012-11-30 ENCOUNTER — Telehealth: Payer: Self-pay | Admitting: Internal Medicine

## 2012-11-30 NOTE — Telephone Encounter (Signed)
Spoke to Cheryl [name]. Reports wheezing, coughing with production of yellow mucus. On the phone, she sounds terrible. She is very hoarse. States that wheezing has been present since her last OV with CY which was 2 months ago. Saw her PCP and was given Doxy, but had no relief. Uses her nebulizer machine but an hour later she is wheezing again. When she called in this afternoon she declined an appointment. I advised her that she didn't sound very good and she really should be seen. She agreed to an appointment. Has been scheduled with TP tomorrow at 9:30am. Cheryl [name] is going to call back if we need to change the time of her appointment.

## 2012-12-01 ENCOUNTER — Encounter: Payer: Self-pay | Admitting: Adult Health

## 2012-12-01 ENCOUNTER — Ambulatory Visit (INDEPENDENT_AMBULATORY_CARE_PROVIDER_SITE_OTHER): Payer: Medicare Other | Admitting: Adult Health

## 2012-12-01 VITALS — BP 110/70 | HR 86 | Temp 98.4°F | Ht 62.0 in | Wt 231.4 lb

## 2012-12-01 DIAGNOSIS — J45909 Unspecified asthma, uncomplicated: Secondary | ICD-10-CM

## 2012-12-01 MED ORDER — LEVALBUTEROL HCL 0.63 MG/3ML IN NEBU
0.6300 mg | INHALATION_SOLUTION | Freq: Once | RESPIRATORY_TRACT | Status: AC
  Administered 2012-12-01: 0.63 mg via RESPIRATORY_TRACT

## 2012-12-01 MED ORDER — HYDROCODONE-HOMATROPINE 5-1.5 MG/5ML PO SYRP: 5.0000 mL | ORAL_SOLUTION | Freq: Four times a day (QID) | ORAL | Status: DC | PRN

## 2012-12-01 MED ORDER — AZITHROMYCIN 250 MG PO TABS: ORAL_TABLET | ORAL | Status: AC

## 2012-12-01 MED ORDER — MOMETASONE FURO-FORMOTEROL FUM 100-5 MCG/ACT IN AERO: 2.0000 | INHALATION_SPRAY | Freq: Two times a day (BID) | RESPIRATORY_TRACT | Status: DC

## 2012-12-01 NOTE — Addendum Note (Signed)
Addended by: Parke Poisson E on: 12/01/2012 11:35 AM   Modules accepted: Orders

## 2012-12-01 NOTE — Progress Notes (Signed)
Subjective:    Patient ID: Cheryl [name], female    DOB: 09/24/1965, 47 y.o.   MRN: [id number]  Asthma Her past medical history is significant for asthma.   June 24, 2010- Allergic rhinitis, OSA, DM, GERD..........................[name] here  Nurse-CC: 4 month follow up visit-wheezing, few asthma attacks, sneezing(using nasal sprays). Sleep- waking up "alot" and other days sleeping all day and not able to wake up well enough.  Now on cefdinir for sinusitis. Sneezing all Fall. Had CT sinus at [facility]- "chronic sinusitis".  Mother reports fall 2 weeks ago- fell onto knees and hands when knees buckled. [name] now when she stands. Evaluated at [facility]. Anticipate referral to [facility] neurology. Had flu vax. Still lives same house.  Continues CPAP. When too tight she got pressure marks- discussed. Not smoking. Getting divorced- looking at options.   August 28, 2010-Allergic rhinitis, Asthma, OSA, DM, GERD.............[name] here  Nurse-CC: Follow up per [name], FNP. Pt c/o chest pressure and tightness, pains in chest when breathing, cough with very little yellow mucus, increased SOB at rest and with activity x 1 wk.  CPAP 10- Usually using it all night every night. Has had to skip this week while ill.  Says she has been on abx since October for sinusitis- did CT sinus. Gets better then worse. In last 1-2 weeks, increased cough and wheeze , tussive soreness mid anterior and left lateral chest. CBC and CXR at [name]. Says 2 days ago WBC 14000, given nebs. Now on doxy 231m daily.  [facility].   11/11/10- Acute OV-Walk in  Complains of increased SOB, wheezing, dry cough, chest congestion, low grade temp x2days. Has a lot of sinus drainage, cough, post nasal drip and wheezing. Wears CPAP at night, no interference. Teeth hurt at  at times. Has fatigue and no energy.  Has been taking zyrtec without much help. Very dry in sinuses. Frontal headache.    12/27/10- Allergic rhinitis, Asthma, OSA, complicated by DM, GERD  Mother here After seeing TP in April got welll after 2 rounds of omnicef. Tongue feels funny. Diabetic control has not been good.  Breathing has been good.   07/08/11- 89YOF former smoker, followed for asthma, allergic rhinitis, OSA/ failed CPAP, complicated by DM, GERD.   Mother here and offers her observations.  Says she felt okay until yesterday. She couldn't walk well, was falling and had gone back to using her walker. They called 911 and they told her she was okay except glucose was high. Mother wonders about "inner ear"/vertigo. Treated with eardrops in October for otitis left ear. Has Bell's palsy off and on causing some weakness in the left lower face which other associates with episodes of viral illness such as bronchitis, or allergy. [name], [name], with a total of 3 rounds of antibiotics this fall.  She now says that cough began about the time she began ramipril from Dr KDwyane Dee    04/29/12- 89YOF former smoker, followed for asthma, allergic rhinitis, OSA/ failed CPAP, complicated by DM, GERD.  Daughter here.  Has had flu shot. Cough, wheezing x 4 days; states friend of hers passed last Friday with MRSA.  Recent cold with increased wheeze, cough, sweating, hoarseness. Reports temperature 101. Scant sputum was green and nasal discharge green  but no headache. 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  09/06/12- 45 YOF former smoker, followed for asthma, allergic rhinitis, OSA/ failed CPAP, complicated by DM, GERD.  Mother here.  Has had flu shot. FOLLOWS [id number] deep cough and wheezing; also states that [employer]PS came picked up CPAP machine due to Medicare and  stating patient not using it for 90 days. Daily wheeze using rescue inhaler or nebulizer to control.no major distress. We discussed ways to provide more stability. Diabetes and persistent thrush interfere with steroid choices. CXR 05/06/12 IMPRESSION:  No evidence of acute cardiopulmonary disease.  Original Report Authenticated By: [name], M.D.   12/01/2012 Acute OV  Complains of wheezing, chest tightness, dry cough, increased SOB - reports no improvement since last ov, worse this AM for 2 weeks .  Has been doing nebs 4x daily. No hemoptysis, n/v, orthopnea, edema, or chest pain.  No otc used. Used Tussionex with some help.   Review of Systems- see HPI Constitutional:   No-   weight loss, night sweats, fevers, chills,+irregular sleep schedule, fatigue, lassitude. HEENT:   No-headaches, difficulty swallowing, tooth/dental problems, +sore throat,       No-  sneezing, itching, +ear ache, nasal congestion, post nasal drip,  CV:  No- chest pain, orthopnea, PND, swelling in lower extremities, anasarca,  dizziness, palpitations Resp: No- acute  shortness of breath with exertion or at rest.              No- productive cough,  + non-productive cough,  No- coughing up of blood.              No- change in color of mucus.  + wheezing.   Skin: No-   rash or lesions. GI:  No-   heartburn, indigestion, abdominal pain, nausea, vomiting,  GU:  MS:  No-   joint pain or swelling.   Neuro-     nothing unusual Psych:  No- change in mood or affect. No depression or anxiety.  No memory loss.  Objective:   Physical Exam General- Alert, Oriented, Affect-appropriate, Distress- none acute. Overweight Skin- rash-none, lesions- none, excoriation- none Lymphadenopathy- none Head- atraumatic            Eyes- Gross vision intact, PERRLA, conjunctivae clear secretions. No nystagmus            Ears- Hearing, canals-normal- no visible problem.            Nose- Clear, no-Septal dev, mucus, polyps, erosion,  perforation ,+max sinus tenderness             Throat- Mallampati II , +mucosa red,+ thrush , drainage- none, tonsils- atrophic. +Voice strained/ hoarse. Neck- flexible , trachea midline, no stridor , thyroid nl, carotid no bruit Chest - symmetrical excursion , unlabored           Heart/CV- RRR , no murmur , no gallop  , no rub, nl s1 s2                           - JVD- none , edema- none, stasis changes- none, varices- none           Lung- +unlabored inspiratory and expiratory wheeze, + dry paroxysmal cough , dullness-none, rub- none/+psuedowheezing            Chest wall-  Abd- Br/ Gen/ Rectal- Not done, not indicated Extrem- cyanosis- none, clubbing, none, atrophy-  none, strength-not assessed.+rolling walker Neuro- grossly intact to observation- using walker, no tremor

## 2012-12-01 NOTE — Assessment & Plan Note (Signed)
Flare with sinusitis  ?VCD flare  Pt declines steroid rx  xopenex neb x 1   Plan  Zpack take as directed.  Mucinex DM Twice daily  As needed  Cough/congestion  Saline nasal rinses As needed   Hydromet 1-2 tsp every 4-6 hr As needed  Cough/congestion .  Begin Dulera 100/24mg 2 puffs Twice daily -brush/rinse/gargle after use.  Please contact office for sooner follow up if symptoms do not improve or worsen or seek emergency care  follow up Dr. YAnnamaria Boots In 2 weeks and As needed

## 2012-12-01 NOTE — Patient Instructions (Addendum)
Zpack take as directed.  Mucinex DM Twice daily  As needed  Cough/congestion  Saline nasal rinses As needed   Hydromet 1-2 tsp every 4-6 hr As needed  Cough/congestion .  Begin Dulera 100/38mg 2 puffs Twice daily -brush/rinse/gargle after use.  Please contact office for sooner follow up if symptoms do not improve or worsen or seek emergency care  follow up Dr. YAnnamaria Boots In 2 weeks and As needed

## 2012-12-16 ENCOUNTER — Ambulatory Visit: Payer: Medicare Other | Admitting: Internal Medicine

## 2012-12-31 ENCOUNTER — Other Ambulatory Visit: Payer: Self-pay | Admitting: Internal Medicine

## 2013-02-09 ENCOUNTER — Encounter (HOSPITAL_COMMUNITY): Payer: Self-pay | Admitting: Radiology

## 2013-02-09 ENCOUNTER — Inpatient Hospital Stay (HOSPITAL_COMMUNITY)
Admission: EM | Admit: 2013-02-09 | Discharge: 2013-02-12 | DRG: 639 | Disposition: A | Discharge status: Home Health | Payer: Medicare Other | Attending: Internal Medicine | Admitting: Internal Medicine

## 2013-02-09 ENCOUNTER — Emergency Department (HOSPITAL_COMMUNITY): Payer: Medicare Other

## 2013-02-09 ENCOUNTER — Inpatient Hospital Stay (HOSPITAL_COMMUNITY): Payer: Medicare Other

## 2013-02-09 DIAGNOSIS — G4733 Obstructive sleep apnea (adult) (pediatric): Secondary | ICD-10-CM | POA: Diagnosis present

## 2013-02-09 DIAGNOSIS — E1149 Type 2 diabetes mellitus with other diabetic neurological complication: Secondary | ICD-10-CM | POA: Diagnosis present

## 2013-02-09 DIAGNOSIS — K219 Gastro-esophageal reflux disease without esophagitis: Secondary | ICD-10-CM

## 2013-02-09 DIAGNOSIS — E1169 Type 2 diabetes mellitus with other specified complication: Principal | ICD-10-CM

## 2013-02-09 DIAGNOSIS — E876 Hypokalemia: Secondary | ICD-10-CM

## 2013-02-09 DIAGNOSIS — R4701 Aphasia: Secondary | ICD-10-CM

## 2013-02-09 DIAGNOSIS — M130 Polyarthritis, unspecified: Secondary | ICD-10-CM | POA: Diagnosis present

## 2013-02-09 DIAGNOSIS — R05 Cough: Secondary | ICD-10-CM

## 2013-02-09 DIAGNOSIS — M549 Dorsalgia, unspecified: Secondary | ICD-10-CM | POA: Diagnosis present

## 2013-02-09 DIAGNOSIS — E039 Hypothyroidism, unspecified: Secondary | ICD-10-CM | POA: Diagnosis present

## 2013-02-09 DIAGNOSIS — IMO0001 Reserved for inherently not codable concepts without codable children: Secondary | ICD-10-CM | POA: Diagnosis present

## 2013-02-09 DIAGNOSIS — Z87891 Personal history of nicotine dependence: Secondary | ICD-10-CM

## 2013-02-09 DIAGNOSIS — Z79899 Other long term (current) drug therapy: Secondary | ICD-10-CM

## 2013-02-09 DIAGNOSIS — E669 Obesity, unspecified: Secondary | ICD-10-CM

## 2013-02-09 DIAGNOSIS — I10 Essential (primary) hypertension: Secondary | ICD-10-CM

## 2013-02-09 DIAGNOSIS — E1142 Type 2 diabetes mellitus with diabetic polyneuropathy: Secondary | ICD-10-CM | POA: Diagnosis present

## 2013-02-09 DIAGNOSIS — R609 Edema, unspecified: Secondary | ICD-10-CM

## 2013-02-09 DIAGNOSIS — Z794 Long term (current) use of insulin: Secondary | ICD-10-CM

## 2013-02-09 DIAGNOSIS — G43909 Migraine, unspecified, not intractable, without status migrainosus: Secondary | ICD-10-CM

## 2013-02-09 DIAGNOSIS — R9431 Abnormal electrocardiogram [ECG] [EKG]: Secondary | ICD-10-CM

## 2013-02-09 DIAGNOSIS — R000 Tachycardia, unspecified: Secondary | ICD-10-CM

## 2013-02-09 DIAGNOSIS — E11649 Type 2 diabetes mellitus with hypoglycemia without coma: Secondary | ICD-10-CM

## 2013-02-09 DIAGNOSIS — R058 Other specified cough: Secondary | ICD-10-CM

## 2013-02-09 DIAGNOSIS — R209 Unspecified disturbances of skin sensation: Secondary | ICD-10-CM | POA: Diagnosis present

## 2013-02-09 DIAGNOSIS — M1480 Arthropathies in other specified diseases classified elsewhere, unspecified site: Secondary | ICD-10-CM

## 2013-02-09 DIAGNOSIS — R5382 Chronic fatigue, unspecified: Secondary | ICD-10-CM

## 2013-02-09 DIAGNOSIS — J328 Other chronic sinusitis: Secondary | ICD-10-CM

## 2013-02-09 DIAGNOSIS — E162 Hypoglycemia, unspecified: Secondary | ICD-10-CM

## 2013-02-09 DIAGNOSIS — G4710 Hypersomnia, unspecified: Secondary | ICD-10-CM

## 2013-02-09 DIAGNOSIS — G9332 Myalgic encephalomyelitis/chronic fatigue syndrome: Secondary | ICD-10-CM

## 2013-02-09 DIAGNOSIS — G510 Bell's palsy: Secondary | ICD-10-CM | POA: Diagnosis present

## 2013-02-09 DIAGNOSIS — K760 Fatty (change of) liver, not elsewhere classified: Secondary | ICD-10-CM

## 2013-02-09 DIAGNOSIS — J449 Chronic obstructive pulmonary disease, unspecified: Secondary | ICD-10-CM | POA: Diagnosis present

## 2013-02-09 DIAGNOSIS — E785 Hyperlipidemia, unspecified: Secondary | ICD-10-CM | POA: Diagnosis present

## 2013-02-09 DIAGNOSIS — J383 Other diseases of vocal cords: Secondary | ICD-10-CM

## 2013-02-09 DIAGNOSIS — B3781 Candidal esophagitis: Secondary | ICD-10-CM

## 2013-02-09 DIAGNOSIS — F3289 Other specified depressive episodes: Secondary | ICD-10-CM | POA: Diagnosis present

## 2013-02-09 DIAGNOSIS — B343 Parvovirus infection, unspecified: Secondary | ICD-10-CM

## 2013-02-09 DIAGNOSIS — R7611 Nonspecific reaction to tuberculin skin test without active tuberculosis: Secondary | ICD-10-CM

## 2013-02-09 DIAGNOSIS — E109 Type 1 diabetes mellitus without complications: Secondary | ICD-10-CM

## 2013-02-09 DIAGNOSIS — B370 Candidal stomatitis: Secondary | ICD-10-CM

## 2013-02-09 DIAGNOSIS — G8929 Other chronic pain: Secondary | ICD-10-CM | POA: Diagnosis present

## 2013-02-09 DIAGNOSIS — K7689 Other specified diseases of liver: Secondary | ICD-10-CM | POA: Diagnosis present

## 2013-02-09 DIAGNOSIS — E559 Vitamin D deficiency, unspecified: Secondary | ICD-10-CM | POA: Diagnosis present

## 2013-02-09 DIAGNOSIS — J4489 Other specified chronic obstructive pulmonary disease: Secondary | ICD-10-CM | POA: Diagnosis present

## 2013-02-09 DIAGNOSIS — IMO0002 Reserved for concepts with insufficient information to code with codable children: Secondary | ICD-10-CM

## 2013-02-09 DIAGNOSIS — T464X5A Adverse effect of angiotensin-converting-enzyme inhibitors, initial encounter: Secondary | ICD-10-CM

## 2013-02-09 DIAGNOSIS — F329 Major depressive disorder, single episode, unspecified: Secondary | ICD-10-CM | POA: Diagnosis present

## 2013-02-09 HISTORY — DX: Interstitial cystitis (chronic) without hematuria: N30.10

## 2013-02-09 HISTORY — DX: Type 2 diabetes mellitus without complications: E11.9

## 2013-02-09 HISTORY — DX: Anxiety disorder, unspecified: F41.9

## 2013-02-09 HISTORY — DX: Other specified postprocedural states: Z98.890

## 2013-02-09 HISTORY — DX: Other specified postprocedural states: R11.2

## 2013-02-09 HISTORY — DX: Essential (primary) hypertension: I10

## 2013-02-09 HISTORY — DX: Polyneuropathy, unspecified: G62.9

## 2013-02-09 HISTORY — DX: Obstructive sleep apnea (adult) (pediatric): G47.33

## 2013-02-09 HISTORY — DX: Other chronic pain: G89.29

## 2013-02-09 HISTORY — DX: Shortness of breath: R06.02

## 2013-02-09 LAB — COMPREHENSIVE METABOLIC PANEL
ALT: 34 U/L (ref 0–35)
AST: 38 U/L — ABNORMAL HIGH (ref 0–37)
Albumin: 4 g/dL (ref 3.5–5.2)
Alkaline Phosphatase: 109 U/L (ref 39–117)
BUN: 8 mg/dL (ref 6–23)
CO2: 25 mEq/L (ref 19–32)
Calcium: 9.7 mg/dL (ref 8.4–10.5)
Chloride: 105 mEq/L (ref 96–112)
Creatinine, Ser: 0.68 mg/dL (ref 0.50–1.10)
GFR calc Af Amer: >90 mL/min (ref >90)
GFR calc non Af Amer: >90 mL/min (ref >90)
Glucose, Bld: 51 mg/dL — ABNORMAL LOW (ref 70–99)
Potassium: 2.8 mEq/L — ABNORMAL LOW (ref 3.5–5.1)
Sodium: 145 mEq/L (ref 135–145)
Total Bilirubin: 0.6 mg/dL (ref 0.3–1.2)
Total Protein: 7.5 g/dL (ref 6.0–8.3)

## 2013-02-09 LAB — URINALYSIS, ROUTINE W REFLEX MICROSCOPIC
Bilirubin Urine: NEGATIVE
Glucose, UA: 100 mg/dL — AB
Hgb urine dipstick: NEGATIVE
Ketones, ur: NEGATIVE mg/dL
Leukocytes, UA: NEGATIVE
Nitrite: NEGATIVE
Protein, ur: NEGATIVE mg/dL
Specific Gravity, Urine: 1.009 (ref 1.005–1.030)
Urobilinogen, UA: 0.2 mg/dL (ref 0.0–1.0)
pH: 7 (ref 5.0–8.0)

## 2013-02-09 LAB — POCT I-STAT, CHEM 8
BUN: 6 mg/dL (ref 6–23)
Calcium, Ion: 1.16 mmol/L (ref 1.12–1.23)
Chloride: 106 mEq/L (ref 96–112)
Creatinine, Ser: 0.7 mg/dL (ref 0.50–1.10)
Glucose, Bld: 51 mg/dL — ABNORMAL LOW (ref 70–99)
HCT: 39 % (ref 36.0–46.0)
Hemoglobin: 13.3 g/dL (ref 12.0–15.0)
Potassium: 3 mEq/L — ABNORMAL LOW (ref 3.5–5.1)
Sodium: 146 mEq/L — ABNORMAL HIGH (ref 135–145)
TCO2: 27 mmol/L (ref 0–100)

## 2013-02-09 LAB — TROPONIN I: Troponin I: <0.3 ng/mL (ref <0.30)

## 2013-02-09 LAB — DIFFERENTIAL
Basophils Absolute: 0.1 10*3/uL (ref 0.0–0.1)
Basophils Relative: 0 % (ref 0–1)
Eosinophils Absolute: 0.7 10*3/uL (ref 0.0–0.7)
Eosinophils Relative: 5 % (ref 0–5)
Lymphocytes Relative: 30 % (ref 12–46)
Lymphs Abs: 4 10*3/uL (ref 0.7–4.0)
Monocytes Absolute: 0.7 10*3/uL (ref 0.1–1.0)
Monocytes Relative: 5 % (ref 3–12)
Neutro Abs: 8.2 10*3/uL — ABNORMAL HIGH (ref 1.7–7.7)
Neutrophils Relative %: 60 % (ref 43–77)

## 2013-02-09 LAB — GLUCOSE, CAPILLARY
Glucose-Capillary: 51 mg/dL — ABNORMAL LOW (ref 70–99)
Glucose-Capillary: 68 mg/dL — ABNORMAL LOW (ref 70–99)
Glucose-Capillary: 124 mg/dL — ABNORMAL HIGH (ref 70–99)
Glucose-Capillary: 88 mg/dL (ref 70–99)

## 2013-02-09 LAB — CBC
HCT: 37.5 % (ref 36.0–46.0)
Hemoglobin: 13.5 g/dL (ref 12.0–15.0)
MCH: 29.5 pg (ref 26.0–34.0)
MCHC: 36 g/dL (ref 30.0–36.0)
MCV: 81.9 fL (ref 78.0–100.0)
Platelets: 302 10*3/uL (ref 150–400)
RBC: 4.58 MIL/uL (ref 3.87–5.11)
RDW: 15.7 % — ABNORMAL HIGH (ref 11.5–15.5)
WBC: 13.6 10*3/uL — ABNORMAL HIGH (ref 4.0–10.5)

## 2013-02-09 LAB — PROTIME-INR
INR: 1 (ref 0.00–1.49)
Prothrombin Time: 13 seconds (ref 11.6–15.2)

## 2013-02-09 LAB — RAPID URINE DRUG SCREEN, HOSP PERFORMED
Amphetamines: NOT DETECTED
Barbiturates: NOT DETECTED
Benzodiazepines: POSITIVE — AB
Cocaine: NOT DETECTED
Opiates: NOT DETECTED
Tetrahydrocannabinol: NOT DETECTED

## 2013-02-09 LAB — POCT I-STAT TROPONIN I: Troponin i, poc: 0.01 ng/mL (ref 0.00–0.08)

## 2013-02-09 LAB — ETHANOL: Alcohol, Ethyl (B): <11 mg/dL (ref 0–11)

## 2013-02-09 LAB — APTT: aPTT: 26 seconds (ref 24–37)

## 2013-02-09 MED ORDER — DEXTROSE 50 % IV SOLN
INTRAVENOUS | Status: AC
  Filled 2013-02-09: qty 50

## 2013-02-09 MED ORDER — ONDANSETRON HCL 4 MG PO TABS: 4.0000 mg | ORAL_TABLET | Freq: Four times a day (QID) | ORAL | Status: DC | PRN

## 2013-02-09 MED ORDER — ONDANSETRON HCL 4 MG/2ML IJ SOLN: 4.0000 mg | Freq: Four times a day (QID) | INTRAMUSCULAR | Status: DC | PRN

## 2013-02-09 MED ORDER — DEXTROSE 50 % IV SOLN
INTRAVENOUS | Status: AC
  Administered 2013-02-09: 25 mL
  Filled 2013-02-09: qty 50

## 2013-02-09 MED ORDER — POTASSIUM CHLORIDE 10 MEQ/100ML IV SOLN
10.0000 meq | INTRAVENOUS | Status: AC
  Administered 2013-02-09 (×3): 10 meq via INTRAVENOUS
  Filled 2013-02-09 (×2): qty 100

## 2013-02-09 MED ORDER — DEXTROSE-NACL 5-0.9 % IV SOLN
INTRAVENOUS | Status: DC
  Administered 2013-02-09: 18:00:00 via INTRAVENOUS

## 2013-02-09 MED ORDER — TORSEMIDE 10 MG PO TABS
10.0000 mg | ORAL_TABLET | Freq: Every day | ORAL | Status: DC
  Administered 2013-02-10: 10 mg via ORAL
  Filled 2013-02-09: qty 1

## 2013-02-09 MED ORDER — BUMETANIDE 1 MG PO TABS
1.0000 mg | ORAL_TABLET | Freq: Two times a day (BID) | ORAL | Status: DC
  Administered 2013-02-09 – 2013-02-11 (×4): 1 mg via ORAL
  Filled 2013-02-09 (×5): qty 1

## 2013-02-09 MED ORDER — DEXTROSE 50 % IV SOLN
INTRAVENOUS | Status: AC
  Administered 2013-02-09: 50 mL via INTRAVENOUS
  Filled 2013-02-09: qty 50

## 2013-02-09 MED ORDER — MONTELUKAST SODIUM 10 MG PO TABS
10.0000 mg | ORAL_TABLET | Freq: Every day | ORAL | Status: DC
  Administered 2013-02-09 – 2013-02-11 (×3): 10 mg via ORAL
  Filled 2013-02-09 (×4): qty 1

## 2013-02-09 MED ORDER — GABAPENTIN 800 MG PO TABS: 800.0000 mg | ORAL_TABLET | Freq: Four times a day (QID) | ORAL | Status: DC

## 2013-02-09 MED ORDER — ALUM & MAG HYDROXIDE-SIMETH 200-200-20 MG/5ML PO SUSP: 30.0000 mL | Freq: Four times a day (QID) | ORAL | Status: DC | PRN

## 2013-02-09 MED ORDER — ALBUTEROL SULFATE HFA 108 (90 BASE) MCG/ACT IN AERS
2.0000 | INHALATION_SPRAY | Freq: Four times a day (QID) | RESPIRATORY_TRACT | Status: DC | PRN
  Administered 2013-02-10 (×2): 2 via RESPIRATORY_TRACT
  Filled 2013-02-09: qty 6.7

## 2013-02-09 MED ORDER — LEVOTHYROXINE SODIUM 112 MCG PO TABS
112.0000 ug | ORAL_TABLET | Freq: Every day | ORAL | Status: DC
  Administered 2013-02-10 – 2013-02-12 (×3): 112 ug via ORAL
  Filled 2013-02-09 (×4): qty 1

## 2013-02-09 MED ORDER — DEXTROSE 50 % IV SOLN: 25.0000 g | Freq: Once | INTRAVENOUS | Status: AC

## 2013-02-09 MED ORDER — GABAPENTIN 400 MG PO CAPS
800.0000 mg | ORAL_CAPSULE | Freq: Four times a day (QID) | ORAL | Status: DC
  Administered 2013-02-09 – 2013-02-12 (×9): 800 mg via ORAL
  Filled 2013-02-09 (×13): qty 2

## 2013-02-09 MED ORDER — DEXTROSE 50 % IV SOLN
25.0000 g | Freq: Once | INTRAVENOUS | Status: AC
  Administered 2013-02-09: 25 g via INTRAVENOUS

## 2013-02-09 MED ORDER — ASPIRIN EC 81 MG PO TBEC
81.0000 mg | DELAYED_RELEASE_TABLET | Freq: Every day | ORAL | Status: DC
  Administered 2013-02-09 – 2013-02-12 (×4): 81 mg via ORAL
  Filled 2013-02-09 (×4): qty 1

## 2013-02-09 MED ORDER — SODIUM CHLORIDE 0.9 % IJ SOLN
3.0000 mL | Freq: Two times a day (BID) | INTRAMUSCULAR | Status: DC
  Administered 2013-02-10 – 2013-02-12 (×3): 3 mL via INTRAVENOUS

## 2013-02-09 MED ORDER — ENOXAPARIN SODIUM 40 MG/0.4ML ~~LOC~~ SOLN
40.0000 mg | SUBCUTANEOUS | Status: DC
  Administered 2013-02-09 – 2013-02-11 (×3): 40 mg via SUBCUTANEOUS
  Filled 2013-02-09 (×4): qty 0.4

## 2013-02-09 MED ORDER — DILTIAZEM HCL ER COATED BEADS 240 MG PO CP24
240.0000 mg | ORAL_CAPSULE | Freq: Every day | ORAL | Status: DC
  Administered 2013-02-10 – 2013-02-12 (×3): 240 mg via ORAL
  Filled 2013-02-09 (×3): qty 1

## 2013-02-09 MED ORDER — NEBIVOLOL HCL 5 MG PO TABS
5.0000 mg | ORAL_TABLET | Freq: Every day | ORAL | Status: DC
  Administered 2013-02-10 – 2013-02-12 (×3): 5 mg via ORAL
  Filled 2013-02-09 (×3): qty 1

## 2013-02-09 NOTE — ED Notes (Signed)
CBG is 57. RN notified.

## 2013-02-09 NOTE — ED Provider Notes (Signed)
History    CSN: [id number] Arrival date & time 02/09/13  1601  First MD Initiated Contact with Patient 02/09/13 1625     No chief complaint on file.  (Consider location/radiation/quality/duration/timing/severity/associated sxs/prior Treatment) HPI Comments: TIME OF ENCOUNTER: 02/09/2013 4:45 PM. Nursing triage note reviewed.  PCP: Abigail Miyamoto, MD  CHIEF COMPLAINT: No chief complaint on file.   HISTORY OF PRESENT ILLNESS: Cheryl [name] is a 47 y.o. female who presents with hx of diabetes who was brought in to the ED with slurred speech and stuttering that started today.  She reports that she has several days of facial numbness as well.  She states the stuttering has been intermittent, sometimes occurring daily, but that it has never been this severe.  Nothing seems to make the stuttering better or worse.  She has no chronic problems with this.  Also, she recently began using a Ercole Georg to help with ambulation d/t problems with gait and dizziness.  She denies other hx of falls, but she does state she fell in the shower today and c/o left knee pain.  She reports no other head injury or LOC.  She states she was to f/u with neurology at [facility] in the next couple of weeks regarding these sx.  Her family called EMS when they found her with stuttering speech, and she was last seen nml at 0900HRS.     Past Medical History:   GERD (gastroesophageal reflux disease)                       Sleep apnea                                                  Chronic airway obstruction, not elsewhere clas*              Asthma                                                       Hyperlipidemia                                               Hypothyroidism                                               Fibromyalgia                                                 Depression                                                   Diabetes mellitus  Gastroparesis                                                   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   DDD (degenerative disc disease)                                Comment:CERVIAL AND LUMBAR   Edema                                                        Human parvovirus infection                                   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  PAST SURGICAL HISTORY: Past Surgical History:   VAGINAL HYSTERECTOMY                                          CHOLECYSTECTOMY                                               NASAL SINUS SURGERY                                           KNEE SURGERY  Right Hip                                        2002         SHOULDER SURGERY                                                Comment:RIGHT   CERVICAL [place] SURGERY                            2004         SOCIAL HISTORY Patient denies any significant drug or alcohol abuse   Smoking Status: Former Smoker                   Packs/Day: 0.10  Years: 3       Smokeless Status: Never Used                        Alcohol Use: No              Drug Use: No             FAMILY HISTORY: Reviewed and felt to be non contributory to history of present illness Review of patient's family history indicates:   Coronary artery disease        Father                     ALLERGIES:  -- Influenza Vaccines    --  Throat swelling, "flu" symptoms  -- Biaxin (Clarithromycin) -- Hives  -- Nucynta (Tapentadol Hydrochloride) -- Other (See Comments)   --   Halucinations  -- Vilazodone Hcl -- Other (See Comments)   --  Hallucinations  -- Amoxicillin   -- Ciprofloxacin   -- Iohexol    --   Desc: hives,dyspnea; throat swelling; ok w/            premeds and omnipaque  -- Levofloxacin   -- Pregabalin   -- Sulfonamide Derivatives   -- Percocet (Oxycodone-Acetaminophen) -- Rash  REVIEW OF SYSTEMS:   Constitutional  No fever, chills,significant change in weight Integumentary   No lesions, rashes,or significant itching Eyes  No change in vision, diplopia, pain,or discharge Ears/Nose/Throat EARS: DENIES:  Pain or discharge NOSE: DENIES: nosebleeds or obstruction THROAT: DENIES: airway obstruction,  trouble swallowing Cardiovascular  No chest pain, palpitations, shortness of breath when lying flat Respiratory PT REPORTS CHRONIC PROBLEMS WITH ASTHMA BUT DENIES ANY NEW DYSPNEA OR COUGH Gastrointestinal  No nausea, vomiting, diarrhea, abdominal pain,  black or bloody stools Genitourinary   PT REPORTS URINARY URGENCY, FREQUENCY AND PAIN "IN BLADDER" Musculoskeletal  LEFT KNEE PAIN AFTER FALL Neurological  SEE HPI Allergic/Immunologic  No specific immunologic complaint Psychiatric  No specific suicidal or homicidal thoughts reported    All other review of systems is otherwise negative except as above and as described in the HPI  PHYSICAL EXAM CONSTITUTIONAL  OBESE, well nourished, alert, non toxic appearing HEENT  normocephalic, atraumatic, external ears normal, nose normal, mucus membranes moist, oropharynx clear EYES  normal conjunctiva, no sclera icterus noted, EOM intact, PERRL WITH PUPILS AT 5MM BILATERALLY NECK  normal ROM, supple, no JVD, trachea normal, no  mass CARDIOVASCULAR  TACHYCARDIC BUT REGULAR RHYTHM HR 100S, no signifcant murmur noted, full and effective pulses PULMONARY/CHEST WALL  normal effort, no respiratory distress, BS normal, no wheezes, no rhonchi, no rales ABDOMINAL  OBESE, no distension, no mass, no  pulsatile mass; soft, non tender, no rigidity, guarding or rebound GU  ;  Genital eval deferred MUSCULOSKELETAL LEFT KNEE CONTUSION NOTED ANTERIORLY.  FULL PASSIVE ROM W/O SIG PAIN. NEUROLOGICAL  patient is awake and responsive;  SHE HAS TREMOR WITH MOVEMENT.  BUEs 3/5 and BLEs 3/5 STRENGTH.  CN IN TACT. SKIN  warm, dry, intact, no rash PSYCHIATRIC  No suicidal or homicidal ideations;  Normal affect and expected cognition  DIAGNOSTIC STUDIES      ED PROVIDER INTERPRETATION OF DATA: Laboratory and radiology tests have been ordered with results reviewed, interpreted and considered in the medical decision making process.      EKG INTERPRETATION:      HR 106 sinus tachycardia. PR 176. QRS 102. QTc 451     RADIOLOGY INTERPRETATION:   CT HEAD WITHOUT CONTRAST   Technique:  Contiguous axial images were obtained from the base of the skull through the vertex without contrast.   Comparison: MRI 05/17/2010.   Findings: The brain has a normal appearance without evidence of atrophy, old or acute infarction, mass lesion, hemorrhage, hydrocephalus or extra-axial collection.  No inflammatory sinus disease.  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  No calvarial abnormality.   These results were called by telephone on 02/09/2013 at 1625 hours to code stroke team Dr. Nicole Kindred, who verbally acknowledged these results.   IMPRESSION: Normal head CT.    LEFT KNEE - 1-2 VIEW  Comparison: None.  Findings: No joint effusion.  No fracture or subluxation.  No radiopaque foreign bodies or soft tissue calcifications.  IMPRESSION:  1.  No acute findings.     Condition: STABLE, Pt well appearing at disposition time.      Ronette Deter, ANN   Note: Portions of this report may have been transcribed using voice recognition software. Every effort was made to ensure accuracy; however, inadvertent computerized transcription errors may be present   Past Medical History  Diagnosis Date   . GERD (gastroesophageal reflux disease)   . Sleep apnea   . Chronic airway obstruction, not elsewhere classified   . Asthma   . Hyperlipidemia   . Hypothyroidism   . Fibromyalgia   . Depression   . Diabetes mellitus   . Gastroparesis     from DM and chronic narcotic use  . DDD (degenerative disc disease)     CERVIAL AND LUMBAR  . Edema   . Human parvovirus infection   . 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 Fatty liver disease, nonalcoholic   . Migraine headache   . Positive PPD   . Vitamin D deficiency   . Vocal cord dysfunction    Past Surgical History  Procedure Laterality Date  . Vaginal hysterectomy    . Cholecystectomy    . Nasal sinus surgery    . Knee surgery    . Right hip  2002  . Shoulder surgery      RIGHT  . Cervical disc surgery  2004   Family History  Problem Relation Age of Onset  . Coronary artery disease Father    History  Substance Use Topics  . Smoking status: Former Smoker -- 0.10 packs/day for 3 years  . Smokeless tobacco: Never Used  .  Alcohol Use: No   OB History   Grav Para Term Preterm Abortions TAB SAB Ect Mult Living                 Review of Systems  Allergies  Influenza vaccines; Biaxin; Nucynta; Vilazodone hcl; Amoxicillin; Ciprofloxacin; Iohexol; Levofloxacin; Pregabalin; Sulfonamide derivatives; and Percocet  Home Medications   Current Outpatient Rx  Name  Route  Sig  Dispense  Refill  . albuterol (PROVENTIL) (2.5 MG/3ML) 0.083% nebulizer solution   Nebulization   Take 3 mLs (2.5 mg total) by nebulization every 6 (six) hours as needed.   360 mL   2     Dx code 493.90   . amitriptyline (ELAVIL) 50 MG tablet   Oral   Take 50 mg by mouth at bedtime.         . bumetanide (BUMEX) 1 MG tablet   Oral   Take 1 mg by mouth 2 (two) times daily.          . chlorpheniramine-HYDROcodone (TUSSIONEX) 10-8 MG/5ML LQCR   Oral   Take 5 mLs by mouth every 12 (twelve)  hours as needed.   140 mL   0   . dextrose 5 % SOLN 250 mL with acyclovir 50 MG/ML SOLN   Oral   Take by mouth as needed.          . diazepam (VALIUM) 5 MG tablet   Oral   Take 5 mg by mouth 2 (two) times daily.          . diclofenac (VOLTAREN) 50 MG EC tablet   Oral   Take 50 mg by mouth 2 (two) times daily as needed.          . diltiazem (CARDIZEM CD) 240 MG 24 hr capsule   Oral   Take 240 mg by mouth daily.           [name] gabapentin (NEURONTIN) 800 MG tablet   Oral   Take 800 mg by mouth. Take 4 (four) times a day         . HYDROcodone-homatropine (HYDROMET) 5-1.5 MG/5ML syrup   Oral   Take 5 mLs by mouth every 6 (six) hours as needed for cough.   240 mL   0   . Icosapent Ethyl (VASCEPA) 1 G CAPS   Oral   Take 2 capsules by mouth 2 (two) times daily.         . insulin glargine (LANTUS) 100 UNIT/ML injection      90 units in the am  50 units in the pm         . Insulin Regular Human (HUMULIN R IJ)   Injection   Inject as directed. 12, 14, 10 units breakfast, lunch , dinner         . ipratropium-albuterol (DUONEB) 0.5-2.5 (3) MG/3ML SOLN   Nebulization   Take 3 mLs by nebulization 4 (four) times daily.           [name] levothyroxine (SYNTHROID, LEVOTHROID) 112 MCG tablet   Oral   Take 112 mcg by mouth daily.           . Liraglutide (VICTOZA) 18 MG/3ML SOPN   Subcutaneous   Inject 18 mg into the skin daily.         . meclizine (ANTIVERT) 25 MG tablet   Oral   Take 25 mg by mouth as needed.           . mometasone-formoterol (DULERA) 100-5 MCG/ACT AERO  Inhalation   Inhale 2 puffs into the lungs 2 (two) times daily.   1 Inhaler   0   . MONOJECT INS SYR 1CC/30G 30G X 5/16" 1 ML MISC      Use as directed to administer insulin         . montelukast (SINGULAIR) 10 MG tablet   Oral   Take 1 tablet (10 mg total) by mouth at bedtime.   30 tablet   11   . nebivolol (BYSTOLIC) 10 MG tablet   Oral   Take 5 mg by mouth daily.           . Nebulizers (NEBULIZER COMPRESSOR) MISC      Use up to 4 times daily as needed   1 each   0   . omeprazole (PRILOSEC) 40 MG capsule   Oral   Take 40 mg by mouth 2 (two) times daily.          . ondansetron (ZOFRAN) 4 MG tablet   Oral   Take 4 mg by mouth every 8 (eight) hours as needed.         . ONE TOUCH ULTRA TEST test strip      Use as directed to check blood glucose         . PROAIR HFA 108 (90 BASE) MCG/ACT inhaler      INHALE 2 PUFFS INTO THE LUNGS EVERY 6 HOURS AS NEEDED.   8.5 g   2   . rosuvastatin (CRESTOR) 40 MG tablet   Oral   Take 40 mg by mouth daily.          . sulindac (CLINORIL) 200 MG tablet   Oral   Take 200 mg by mouth 2 (two) times daily.         . SUMAtriptan (IMITREX) 100 MG tablet   Oral   Take 100 mg by mouth as needed.           [name] tiZANidine (ZANAFLEX) 4 MG capsule   Oral   Take 4 mg by mouth 3 (three) times daily as needed.          . torsemide (DEMADEX) 20 MG tablet      1/2 tab by mouth twice daily         . Vitamin D, Ergocalciferol, (DRISDOL) 50000 UNITS CAPS   Oral   Take 50,000 Units by mouth every 7 (seven) days.          There were no vitals taken for this visit. Physical Exam  ED Course  Procedures (including critical care time) Labs Reviewed  CBC - Abnormal; Notable for the following:    WBC 13.6 (*)    RDW 15.7 (*)    All other components within normal limits  DIFFERENTIAL - Abnormal; Notable for the following:    Neutro Abs 8.2 (*)    All other components within normal limits  POCT I-STAT, CHEM 8 - Abnormal; Notable for the following:    Sodium 146 (*)    Potassium 3.0 (*)    Glucose, Bld 51 (*)    All other components within normal limits  PROTIME-INR  APTT  ETHANOL  COMPREHENSIVE METABOLIC PANEL  TROPONIN I  URINE RAPID DRUG SCREEN (HOSP PERFORMED)  URINALYSIS, ROUTINE W REFLEX MICROSCOPIC  POCT I-STAT TROPONIN I   Ct Head Wo Contrast  02/09/2013   *RADIOLOGY REPORT*  Clinical  Data: Facial droop and numbness.  Speech disturbance.  CT HEAD WITHOUT CONTRAST  Technique:  Contiguous  axial images were obtained from the base of the skull through the vertex without contrast.  Comparison: MRI 05/17/2010.  Findings: The brain has a normal appearance without evidence of atrophy, old or acute infarction, mass lesion, hemorrhage, hydrocephalus or extra-axial collection.  No inflammatory sinus disease.  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  No calvarial abnormality.  These results were called by telephone on 02/09/2013 at 1625 hours to code stroke team Dr. Nicole Kindred, who verbally acknowledged these results.  IMPRESSION: Normal head CT.   Original Report Authenticated By: Nelson Chimes, M.D.   No diagnosis found.  Narrative: *RADIOLOGY REPORT*  Clinical Data: Code stroke.  CHEST - 1 VIEW  Comparison: 05/06/2012.  Findings: Normal heart size and mediastinal contours. Azygos fissure.  Lungs clear and well aerated. No effusion or pneumothorax. Deficiency of the lateral right clavicle, likely postoperative. Lower cervical ACDF with plate screw fixation.  IMPRESSION: No evidence of acute cardiopulmonary disease.    MDM  Hypokalemia: will give IV potassium Hypoglycemia: will give D50 IV and reassess; reassessment shows continued hypoglycemia - will give another D50 and start D5NS.  Will admit to hospital for further assessment and care. Stuttering: do not suspect CVA - neurology has seen pt and agrees etiology more likely hypoglycemia.  No infectious etiology suspected - UA and CXR neg.   CRITICAL CARE Performed by: Ronette Deter, ANN   Total critical care time: 35 min  Critical care time was exclusive of separately billable procedures and treating other patients.  Critical care was necessary to treat or prevent imminent or life-threatening deterioration.  Critical care was time spent personally by me on the following activities: development of treatment plan  with patient and/or surrogate as well as nursing, discussions with consultants, evaluation of patient's response to treatment, examination of patient, obtaining history from patient or surrogate, ordering and performing treatments and interventions, ordering and review of laboratory studies, ordering and review of radiographic studies, pulse oximetry and re-evaluation of patient's condition.   Leighton Parody, MD 02/09/13 [phone]

## 2013-02-09 NOTE — ED Notes (Signed)
CBG is 96. RN notified.

## 2013-02-09 NOTE — ED Notes (Signed)
Pt from home.  Pt reports facial numbness x 2 days.  Pt LSN 0900 today.  Pt's family returned home and found pt to have slurred speech, stuttering, facial droop and EMS called at 1500.  Pt with hx of tremors to R arm.

## 2013-02-09 NOTE — ED Notes (Signed)
Family at bedside. 

## 2013-02-09 NOTE — H&P (Signed)
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  Pt was taking a shower earlier and got very weak fell to the ground.  No loc.  No cp.  No sob.  Her glucose was less than 50.  Her pcp recently started her on victoza and pt says she has no appetite with this and skips meals.  She had not eaten all day.  No fevers.  No n/v/d.  On arrival to ED code stroke was initiated.  Now pt glucose is still low, on d10 gtt.  Her mental status has improved.  No abd pain.  She did hurt her left knee when she fell in the shower but other than that pain has no pain and no trouble moving all extremities.  No drooling.  She has peripheral neuropathy which is stable.  No cough.  No dysuria.  No rashes.  Review of Systems:  Positive and negative as per HPI otherwise all other systems are negative  Past Medical History: Past Medical History  Diagnosis Date  . GERD (gastroesophageal reflux disease)   . Sleep apnea   . Chronic airway obstruction, not elsewhere classified   . Asthma   . Hyperlipidemia   . Hypothyroidism   . Fibromyalgia   . Depression   . Diabetes mellitus   . Gastroparesis     from DM and chronic narcotic use  . DDD (degenerative disc disease)     CERVIAL AND LUMBAR  . Edema   . Human parvovirus infection   . 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 Fatty liver disease, nonalcoholic   . Migraine headache   . Positive PPD   . Vitamin D deficiency   . Vocal cord dysfunction    Past Surgical History  Procedure Laterality Date  . Vaginal hysterectomy    . Cholecystectomy    . Nasal sinus surgery    . Knee surgery    . Right hip  2002  . Shoulder surgery      RIGHT  . Cervical disc surgery  2004    Medications: Prior to Admission medications   Medication Sig Start Date End Date Taking? Authorizing Provider   albuterol (PROVENTIL HFA;VENTOLIN HFA) 108 (90 BASE) MCG/ACT inhaler Inhale 2 puffs into the lungs every 6 (six) hours as needed for wheezing. For wheezing   Yes Historical Provider, MD  albuterol (PROVENTIL) (2.5 MG/3ML) 0.083% nebulizer solution Take 2.5 mg by nebulization every 6 (six) hours as needed. For wheezing 04/30/12  Yes Deneise Lever, MD  amitriptyline (ELAVIL) 50 MG tablet Take 50 mg by mouth at bedtime.   Yes Historical Provider, MD  bumetanide (BUMEX) 1 MG tablet Take 1 mg by mouth 2 (two) times daily.    Yes Historical Provider, MD  diazepam (VALIUM) 5 MG tablet Take 10 mg by mouth at bedtime.    Yes Historical Provider, MD  diltiazem (CARDIZEM CD) 240 MG 24 hr capsule Take 240 mg by mouth daily.     Yes Historical Provider, MD  gabapentin (NEURONTIN) 800 MG tablet Take 800 mg by mouth 4 (four) times daily.    Yes Historical Provider, MD  Icosapent Ethyl (VASCEPA) 1 G CAPS Take 2 capsules by mouth 2 (two) times daily.   Yes Historical Provider, MD  insulin glargine (LANTUS) 100 UNIT/ML injection Inject 50 Units into  the skin at bedtime as needed. For high blood sugar   Yes Historical Provider, MD  insulin regular human CONCENTRATED (HUMULIN R) 500 UNIT/ML SOLN injection Inject 10-30 Units into the skin daily. Sliding scale as directed   Yes Historical Provider, MD  ipratropium-albuterol (DUONEB) 0.5-2.5 (3) MG/3ML SOLN Take 3 mLs by nebulization 2 (two) times daily as needed. For wheezing   Yes Historical Provider, MD  levothyroxine (SYNTHROID, LEVOTHROID) 112 MCG tablet Take 112 mcg by mouth daily.     Yes Historical Provider, MD  Liraglutide (VICTOZA) 18 MG/3ML SOPN Inject 1.2 mg into the skin daily.    Yes Historical Provider, MD  montelukast (SINGULAIR) 10 MG tablet Take 1 tablet (10 mg total) by mouth at bedtime. 09/06/12  Yes Deneise Lever, MD  nebivolol (BYSTOLIC) 5 MG tablet Take 5 mg by mouth daily.   Yes Historical Provider, MD  omeprazole (PRILOSEC) 40 MG capsule Take 40  mg by mouth 2 (two) times daily.    Yes Historical Provider, MD  pramipexole (MIRAPEX) 0.5 MG tablet Take 0.5 mg by mouth 2 (two) times daily.   Yes Historical Provider, MD  promethazine (PHENERGAN) 12.5 MG tablet Take 12.5 mg by mouth every 6 (six) hours as needed for nausea. For nausea   Yes Historical Provider, MD  rosuvastatin (CRESTOR) 40 MG tablet Take 40 mg by mouth every evening.    Yes Historical Provider, MD  sulindac (CLINORIL) 200 MG tablet Take 200 mg by mouth 2 (two) times daily.   Yes Historical Provider, MD  tiZANidine (ZANAFLEX) 4 MG capsule Take 4 mg by mouth 3 (three) times daily as needed. For muscle spasms   Yes Historical Provider, MD  torsemide (DEMADEX) 20 MG tablet Take 10 mg by mouth daily. 1/2 tab by mouth twice daily   Yes Historical Provider, MD  Vitamin D, Ergocalciferol, (DRISDOL) 50000 UNITS CAPS Take 50,000 Units by mouth every 7 (seven) days. Takes on Wed   Yes Historical Provider, MD  MONOJECT INS SYR 1CC/30G 30G X 5/16" 1 ML MISC Use as directed to administer insulin 10/21/12   Historical Provider, MD  Nebulizers (NEBULIZER COMPRESSOR) MISC Use up to 4 times daily as needed 04/29/12 04/29/13  Deneise Lever, MD  ONE TOUCH ULTRA TEST test strip Use as directed to check blood glucose 11/16/12   Historical Provider, MD  SUMAtriptan (IMITREX) 100 MG tablet Take 100 mg by mouth as needed. For migraine    Historical Provider, MD    Allergies:   Allergies  Allergen Reactions  . Influenza Vaccines     Throat swelling, "flu" symptoms  . Levofloxacin Anaphylaxis and Hives  . Sulfonamide Derivatives Anaphylaxis  . Biaxin (Clarithromycin) Hives  . Nucynta (Tapentadol Hydrochloride) Other (See Comments)    Halucinations insomnia x3 days  . Vilazodone Hcl Other (See Comments)    Hallucinations  . Amoxicillin Hives  . Ciprofloxacin Hives  . Iohexol      Desc: hives,dyspnea; throat swelling; ok w/ premeds and omnipaque   . Pregabalin Other (See Comments)     Reaction-Syncope & unable to speak  . Percocet (Oxycodone-Acetaminophen) Rash    Social History:  reports that she has quit smoking. She has never used smokeless tobacco. She reports that she does not drink alcohol or use illicit drugs.  Family History: Family History  Problem Relation Age of Onset  . Coronary artery disease Father     Physical Exam: Filed Vitals:   02/09/13 1830 02/09/13 1847 02/09/13 1900 02/09/13 1915  BP:   145/71 130/105  Pulse: 96  97 96  Temp:  97.7 F (36.5 C)    Resp: 16  17 15   SpO2: 94%  96% 95%   General appearance: alert, cooperative and no distress Head: Normocephalic, without obvious abnormality, atraumatic Eyes: negative Nose: Nares normal. Septum midline. Mucosa normal. No drainage or sinus tenderness. Neck: no JVD and supple, symmetrical, trachea midline Lungs: clear to auscultation bilaterally Heart: regular rate and rhythm, S1, S2 normal, no murmur, click, rub or gallop Abdomen: soft, non-tender; bowel sounds normal; no masses,  no organomegaly Extremities: extremities normal, atraumatic, no cyanosis or edema Pulses: 2+ and symmetric Skin: Skin color, texture, turgor normal. No rashes or lesions Neurologic: Grossly normal    Labs on Admission:   Recent Labs  02/09/13 1619 02/09/13 1627  NA 145 146*  K 2.8* 3.0*  CL 105 106  CO2 25  --   GLUCOSE 51* 51*  BUN 8 6  CREATININE 0.68 0.70  CALCIUM 9.7  --     Recent Labs  02/09/13 1619  AST 38*  ALT 34  ALKPHOS 109  BILITOT 0.6  PROT 7.5  ALBUMIN 4.0    Recent Labs  02/09/13 1619 02/09/13 1627  WBC 13.6*  --   NEUTROABS 8.2*  --   HGB 13.5 13.3  HCT 37.5 39.0  MCV 81.9  --   PLT 302  --     Recent Labs  02/09/13 1619  TROPONINI <0.30   Radiological Exams on Admission: Dg Knee 2 Views Left  02/09/2013   *RADIOLOGY REPORT*  Clinical Data: Code stroke.  Status post fall.  LEFT KNEE - 1-2 VIEW  Comparison: None.  Findings: No joint effusion.  No fracture or  subluxation.  No radiopaque foreign bodies or soft tissue calcifications.  IMPRESSION:  1.  No acute findings.   Original Report Authenticated By: Kerby Moors, M.D.   Ct Head Wo Contrast  02/09/2013   *RADIOLOGY REPORT*  Clinical Data: Facial droop and numbness.  Speech disturbance.  CT HEAD WITHOUT CONTRAST  Technique:  Contiguous axial images were obtained from the base of the skull through the vertex without contrast.  Comparison: MRI 05/17/2010.  Findings: The brain has a normal appearance without evidence of atrophy, old or acute infarction, mass lesion, hemorrhage, hydrocephalus or extra-axial collection.  No inflammatory sinus disease.  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  No calvarial abnormality.  These results were called by telephone on 02/09/2013 at 1625 hours to code stroke team Dr. Nicole Kindred, who verbally acknowledged these results.  IMPRESSION: Normal head CT.   Original Report Authenticated By: Nelson Chimes, M.D.    Assessment/Plan  47 yo female with hypoglycemia, generalized weakness, slurred speech and polypharmacy. Principal Problem:   Hypoglycemia Active Problems:   IDDM   Essential hypertension, benign   Chronic fatigue syndrome   Polyarthropathy associated with another disorder   Hypokalemia  Neuro evaluation appreciated.  Symptoms likely due to hypoglycemia.  Hold all dm and insulin products.  Ck glucose hourly until normalizes for several hours in a row.  freq neuro cks in am, if any neuro issues develop reconsider further neurological w/u.  cxr and ua are pending to r/o underlying infection.  Pt asymptomatic of any foci of infection.  Polypharmacy may be also contributing to her symptoms.  Monitor on tele.  Obs.  Full code.    Antonique Langford A 02/09/2013, 7:27 PM

## 2013-02-09 NOTE — Consult Note (Signed)
Referring Physician: ED/CODE STROKE    Chief Complaint: slurred speech, right face droop, left greater than right arm weakness.  HPI:                                                                                                                                         Cheryl [name] is an 47 y.o. female with a past medical history significant for DM, hyperlipidemia, migraine, sleep apnea, asthma, GERD, hypothyroidism, depression, vitamin D deficiency, brought to [facility] ED as a code stroke due to new onset slurred speech, right face droop, and left greater than right upper extremity weakness. She was last known well at  9 am today when she was noted to have slurred speech and right facial droop. She indicated that the left arm has been weaker than the right for quite some time but got worse today. Furthermore, she reports face numbness since last yesterday. Complains of poor balance and fell yesterday. Stated that she is scheduled to see a neurologist at [facility] to address her imbalance. Frequent migraines and had had similar speech changes in the past " that come and go". CT brain unremarkable. Patient very emotional upon arrival to ED, her neuro-exam is rather functional which precludes a reliable NIHSS. CBG 45.  Date last known well: 02/09/13 Time last known well: 9 am tPA Given: no, late presentation. NIHSS: unreliable, functional exam  Past Medical History  Diagnosis Date  . GERD (gastroesophageal reflux disease)   . Sleep apnea   . Chronic airway obstruction, not elsewhere classified   . Asthma   . Hyperlipidemia   . Hypothyroidism   . Fibromyalgia   . Depression   . Diabetes mellitus   . Gastroparesis     from DM and chronic narcotic use  . DDD (degenerative disc disease)     CERVIAL AND LUMBAR  . Edema   . Human parvovirus infection   . 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 Fatty liver disease, nonalcoholic   .  Migraine headache   . Positive PPD   . Vitamin D deficiency   . Vocal cord dysfunction     Past Surgical History  Procedure Laterality Date  . Vaginal hysterectomy    . Cholecystectomy    . Nasal sinus surgery    . Knee surgery    . Right hip  2002  . Shoulder surgery      RIGHT  . Cervical disc surgery  2004    Family History  Problem Relation Age of Onset  . Coronary artery disease Father    Social History:  reports that she has quit smoking. She has never used smokeless tobacco. She reports that she does not drink alcohol or use illicit drugs.  Allergies:  Allergies  Allergen Reactions  . Influenza Vaccines     Throat swelling, "flu" symptoms  .  Biaxin (Clarithromycin) Hives  . Nucynta (Tapentadol Hydrochloride) Other (See Comments)    Halucinations  . Vilazodone Hcl Other (See Comments)    Hallucinations  . Amoxicillin   . Ciprofloxacin   . Iohexol      Desc: hives,dyspnea; throat swelling; ok w/ premeds and omnipaque   . Levofloxacin   . Pregabalin   . Sulfonamide Derivatives   . Percocet (Oxycodone-Acetaminophen) Rash    Medications:                                                                                                                           I have reviewed the patient's current medications.  ROS:                                                                                                                                       History obtained from the patient and chart review.  General ROS: negative for - chills, fatigue, fever, night sweats, weight gain or weight loss Psychological ROS: negative for - behavioral disorder, hallucinations, memory difficulties, or suicidal ideation Ophthalmic ROS: negative for - blurry vision, double vision, eye pain or loss of vision ENT ROS: negative for - epistaxis, nasal discharge, oral lesions, sore throat, tinnitus or vertigo Allergy and Immunology ROS: negative for - hives or itchy/watery  eyes Hematological and Lymphatic ROS: negative for - bleeding problems, bruising or swollen lymph nodes Endocrine ROS: negative for - galactorrhea, hair pattern changes, polydipsia/polyuria or temperature intolerance Respiratory ROS: negative for - cough, hemoptysis, shortness of breath or wheezing Cardiovascular ROS: negative for - chest pain, dyspnea on exertion, edema or irregular heartbeat Gastrointestinal ROS: negative for - abdominal pain, diarrhea, hematemesis, nausea/vomiting or stool incontinence Genito-Urinary ROS: negative for - dysuria, hematuria, incontinence or urinary frequency/urgency Musculoskeletal ROS: negative for - joint swelling Neurological ROS: as noted in HPI Dermatological ROS: negative for rash and skin lesion changes    Physical exam: pleasant female in no apparent distress but very emotional. BP 130/70 P 82  R 17 afebrile Neck: supple, no bruits, no JVD. Cardiac: no murmurs. Lungs: clear. Abdomen: soft, no tender, no mass. Extremities: no edema.    Neurologic Examination:  Mental Status: Alert, awake, oriented, thought content appropriate.  Stuttering but no frank dysarthria or aphasia.  Able to follow 3 step commands without difficulty. Cranial Nerves: II: Discs flat bilaterally; Visual fields grossly normal, pupils equal, round, reactive to light and accommodation III,IV, VI: ptosis not present, extra-ocular motions intact bilaterally V,VII: smile symmetric, facial light touch sensation normal bilaterally VIII: hearing normal bilaterally IX,X: gag reflex present XI: bilateral shoulder shrug XII: midline tongue extension Motor: Poor effort and I can not appreciate obvious focal weakness. Tone and bulk:normal tone throughout; no atrophy noted Sensory: Pinprick and light touch intact throughout, bilaterally Deep Tendon Reflexes:  1+ all  over  Plantars: Right: downgoing   Left: downgoing Cerebellar: normal finger-to-nose,  normal heel-to-shin test Gait:  No tested CV: pulses palpable throughout      Results for orders placed during the hospital encounter of 02/09/13 (from the past 48 hour(s))  PROTIME-INR     Status: None   Collection Time    02/09/13  4:19 PM      Result Value Range   Prothrombin Time 13.0  11.6 - 15.2 seconds   INR 1.00  0.00 - 1.49  APTT     Status: None   Collection Time    02/09/13  4:19 PM      Result Value Range   aPTT 26  24 - 37 seconds  CBC     Status: Abnormal   Collection Time    02/09/13  4:19 PM      Result Value Range   WBC 13.6 (*) 4.0 - 10.5 K/uL   RBC 4.58  3.87 - 5.11 MIL/uL   Hemoglobin 13.5  12.0 - 15.0 g/dL   HCT 37.5  36.0 - 46.0 %   MCV 81.9  78.0 - 100.0 fL   MCH 29.5  26.0 - 34.0 pg   MCHC 36.0  30.0 - 36.0 g/dL   RDW 15.7 (*) 11.5 - 15.5 %   Platelets 302  150 - 400 K/uL  DIFFERENTIAL     Status: Abnormal   Collection Time    02/09/13  4:19 PM      Result Value Range   Neutrophils Relative % 60  43 - 77 %   Neutro Abs 8.2 (*) 1.7 - 7.7 K/uL   Lymphocytes Relative 30  12 - 46 %   Lymphs Abs 4.0  0.7 - 4.0 K/uL   Monocytes Relative 5  3 - 12 %   Monocytes Absolute 0.7  0.1 - 1.0 K/uL   Eosinophils Relative 5  0 - 5 %   Eosinophils Absolute 0.7  0.0 - 0.7 K/uL   Basophils Relative 0  0 - 1 %   Basophils Absolute 0.1  0.0 - 0.1 K/uL  POCT I-STAT TROPONIN I     Status: None   Collection Time    02/09/13  4:26 PM      Result Value Range   Troponin i, poc 0.01  0.00 - 0.08 ng/mL   Comment 3            Comment: Due to the release kinetics of cTnI,     a negative result within the first hours     of the onset of symptoms does not rule out     myocardial infarction with certainty.     If myocardial infarction is still suspected,     repeat the test at appropriate intervals.  POCT I-STAT, CHEM 8     Status: Abnormal  Collection Time    02/09/13  4:27  PM      Result Value Range   Sodium 146 (*) 135 - 145 mEq/L   Potassium 3.0 (*) 3.5 - 5.1 mEq/L   Chloride 106  96 - 112 mEq/L   BUN 6  6 - 23 mg/dL   Creatinine, Ser 0.70  0.50 - 1.10 mg/dL   Glucose, Bld 51 (*) 70 - 99 mg/dL   Calcium, Ion 1.16  1.12 - 1.23 mmol/L   TCO2 27  0 - 100 mmol/L   Hemoglobin 13.3  12.0 - 15.0 g/dL   HCT 39.0  36.0 - 46.0 %   Ct Head Wo Contrast  02/09/2013   *RADIOLOGY REPORT*  Clinical Data: Facial droop and numbness.  Speech disturbance.  CT HEAD WITHOUT CONTRAST  Technique:  Contiguous axial images were obtained from the base of the skull through the vertex without contrast.  Comparison: MRI 05/17/2010.  Findings: The brain has a normal appearance without evidence of atrophy, old or acute infarction, mass lesion, hemorrhage, hydrocephalus or extra-axial collection.  No inflammatory sinus disease.  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  No calvarial abnormality.  These results were called by telephone on 02/09/2013 at 1625 hours to code stroke team Dr. Nicole Kindred, who verbally acknowledged these results.  IMPRESSION: Normal head CT.   Original Report Authenticated By: Nelson Chimes, M.D.     [facility] [phone]  02/09/2013, 4:47 PM   Assessment: 47 y.o. female comes in with new onset stuttering, right face droop, and left greater than right side weakness. However, her neuro-exam seems to be functional and she has a CBG 45. No a candidate for thrombolysis due to a combination of late presentation and functional exam. CT brain unremarkable. An acute cerebrovascular insult  appears to be very unlikely. Will defer further neurological intervention. Call neurology with questions or concerns.  Stroke Risk Factors - DM, hyperlipidemia.     Dorian Pod ,MD [facility] [phone]  02/09/2013, 4:47 PM

## 2013-02-09 NOTE — ED Notes (Signed)
Code Stroke encoded at 1536.  Phlebotomist arrival [phone].  Pt arrival 1601.  EDP exam, Stroke team arrival, Neurologist arrival 1602.  Pt to CT 1614.  Code stroke cancelled after CT scan.

## 2013-02-09 NOTE — ED Notes (Signed)
Admitting MD at bedside.

## 2013-02-09 NOTE — ED Notes (Signed)
cbg 45. rn notified. Dr. In room when rn was notified.

## 2013-02-09 NOTE — ED Notes (Signed)
Pt appeared to have difficulty swallowing water with straw.  Swallow screen stopped.

## 2013-02-10 LAB — HEMOGLOBIN A1C
Hgb A1c MFr Bld: 5.5 % (ref <5.7)
Mean Plasma Glucose: 111 mg/dL (ref <117)

## 2013-02-10 LAB — BASIC METABOLIC PANEL
BUN: 5 mg/dL — ABNORMAL LOW (ref 6–23)
CO2: 24 mEq/L (ref 19–32)
Calcium: 9.2 mg/dL (ref 8.4–10.5)
Chloride: 103 mEq/L (ref 96–112)
Creatinine, Ser: 0.62 mg/dL (ref 0.50–1.10)
GFR calc Af Amer: >90 mL/min (ref >90)
GFR calc non Af Amer: >90 mL/min (ref >90)
Glucose, Bld: 175 mg/dL — ABNORMAL HIGH (ref 70–99)
Potassium: 3.1 mEq/L — ABNORMAL LOW (ref 3.5–5.1)
Sodium: 142 mEq/L (ref 135–145)

## 2013-02-10 LAB — CBC
HCT: 36.5 % (ref 36.0–46.0)
Hemoglobin: 12.6 g/dL (ref 12.0–15.0)
MCH: 28.8 pg (ref 26.0–34.0)
MCHC: 34.5 g/dL (ref 30.0–36.0)
MCV: 83.3 fL (ref 78.0–100.0)
Platelets: 326 10*3/uL (ref 150–400)
RBC: 4.38 MIL/uL (ref 3.87–5.11)
RDW: 16.1 % — ABNORMAL HIGH (ref 11.5–15.5)
WBC: 11.1 10*3/uL — ABNORMAL HIGH (ref 4.0–10.5)

## 2013-02-10 LAB — GLUCOSE, CAPILLARY
Glucose-Capillary: 107 mg/dL — ABNORMAL HIGH (ref 70–99)
Glucose-Capillary: 111 mg/dL — ABNORMAL HIGH (ref 70–99)
Glucose-Capillary: 137 mg/dL — ABNORMAL HIGH (ref 70–99)
Glucose-Capillary: 151 mg/dL — ABNORMAL HIGH (ref 70–99)
Glucose-Capillary: 181 mg/dL — ABNORMAL HIGH (ref 70–99)
Glucose-Capillary: 196 mg/dL — ABNORMAL HIGH (ref 70–99)
Glucose-Capillary: 228 mg/dL — ABNORMAL HIGH (ref 70–99)
Glucose-Capillary: 93 mg/dL (ref 70–99)
Glucose-Capillary: 98 mg/dL (ref 70–99)

## 2013-02-10 MED ORDER — IPRATROPIUM BROMIDE 0.02 % IN SOLN
0.5000 mg | Freq: Two times a day (BID) | RESPIRATORY_TRACT | Status: DC
  Administered 2013-02-10 – 2013-02-12 (×4): 0.5 mg via RESPIRATORY_TRACT
  Filled 2013-02-10 (×4): qty 2.5

## 2013-02-10 MED ORDER — ALBUTEROL SULFATE (5 MG/ML) 0.5% IN NEBU
2.5000 mg | INHALATION_SOLUTION | Freq: Four times a day (QID) | RESPIRATORY_TRACT | Status: DC | PRN
  Administered 2013-02-10: 2.5 mg via RESPIRATORY_TRACT
  Filled 2013-02-10: qty 0.5

## 2013-02-10 MED ORDER — INSULIN ASPART 100 UNIT/ML ~~LOC~~ SOLN
0.0000 [IU] | Freq: Three times a day (TID) | SUBCUTANEOUS | Status: DC
  Administered 2013-02-10: 3 [IU] via SUBCUTANEOUS
  Administered 2013-02-11: 5 [IU] via SUBCUTANEOUS
  Administered 2013-02-11: 3 [IU] via SUBCUTANEOUS
  Administered 2013-02-11 – 2013-02-12 (×3): 5 [IU] via SUBCUTANEOUS

## 2013-02-10 MED ORDER — ALBUTEROL SULFATE (5 MG/ML) 0.5% IN NEBU
2.5000 mg | INHALATION_SOLUTION | Freq: Two times a day (BID) | RESPIRATORY_TRACT | Status: DC
  Administered 2013-02-10 – 2013-02-12 (×4): 2.5 mg via RESPIRATORY_TRACT
  Filled 2013-02-10 (×4): qty 0.5

## 2013-02-10 MED ORDER — INSULIN ASPART 100 UNIT/ML ~~LOC~~ SOLN: 0.0000 [IU] | Freq: Three times a day (TID) | SUBCUTANEOUS | Status: DC

## 2013-02-10 MED ORDER — DEXTROSE 50 % IV SOLN: 25.0000 mL | Freq: Once | INTRAVENOUS | Status: AC | PRN

## 2013-02-10 MED ORDER — IBUPROFEN 600 MG PO TABS
600.0000 mg | ORAL_TABLET | Freq: Four times a day (QID) | ORAL | Status: DC | PRN
  Administered 2013-02-10 – 2013-02-11 (×2): 600 mg via ORAL
  Filled 2013-02-10 (×3): qty 1

## 2013-02-10 NOTE — Progress Notes (Signed)
[facility] PROGRESS NOTE  Cheryl [name] KXF:[id number] DOB: June 17, 1966 DOA: 02/09/2013 PCP: Abigail Miyamoto, MD  At the time of admission:  47 yo female presents with slurred speech, confusion and generalized weakness. Pt was taking a shower earlier and got very weak fell to the ground. No loc. No cp. No sob. Her glucose was less than 50. Her pcp recently started her on victoza in addition to U500 and lantus.  Pt says she has no appetite and skips meals after taking her U500.  No fevers. No n/v/d. On arrival to ED code stroke was initiated. Now pt glucose is still low, on d10 gtt. Her mental status has improved. No abd pain. She did hurt her left knee when she fell in the shower but other than that pain has no pain and no trouble moving all extremities. No drooling. She has peripheral neuropathy which is stable. No cough. No dysuria. No rashes.  Assessment/Plan:  Hypoglycemia Secondary to taking insulin (U500) but then not eating.She is on multiple insulin regimen at home with Lantus and U500, also on Victoza D5 ivf,  D50 given PRN, CBGs stabilizing, patient now eating.  D/C d5 IVF Started on SSI - moderate.  Have not resumed basal insulin yet. Appreciate diabetic coordinator consultation. Hopefully d/c 7/18 will complete resolution of symptoms. A1C pending  Physcial Therapy / Occupation Therapy Consultations requested.   May need to consider Home Health Aid vs ALF if patient is unable to manage her own care at home.  IDDM Will resume and adjust DM medications and CBGs indicate. Await A1C  HTN Well controlled on diltiazem, bystolic, and demedex  Neurological abnomalities:  Slurred/stuttering speech / facial droop Neuro hospitalists consulted.  Acute cerebrovascular insult felt to be unlikely Possibly related to hypoglycemia vs psychosomatic  Will continue to monitor overnight for improvement. Ct head neg Dr Sloan Leiter spoke with Dr Aram Beecham today-he again advised against further  investigation. She does have a slight right facial droop, but is otherwise non focal  Hypokalemia 2.8 -- > 3.1 Will continue to replete Check magnesium.  Polypharmacy Will check with pharmacy for a consultation  Other co-morbidities including chronic pain, depression/ anxiety, interstitial cystitis, peripheral neuropathy, migraine and polyarthropathy appear to be stable.  Continue on home medications.    DVT Prophylaxis:  lovenox  Code Status: full  Family Communication: Disposition Plan: d/c 7/18 if stable.   Consultants:    Procedures:    Antibiotics:    HPI/Subjective: C/o knee pain.    Objective: Filed Vitals:   02/09/13 2058 02/09/13 2101 02/10/13 0500 02/10/13 1130  BP: 133/57 137/80 104/75 125/84  Pulse:   104 103  Temp:   98.6 F (37 C)   TempSrc:   Oral   Resp:   18   Height:      Weight:   99.927 kg (220 lb 4.8 oz)   SpO2: 94% 93% 90%     Intake/Output Summary (Last 24 hours) at 02/10/13 1220 Last data filed at 02/10/13 0900  Gross per 24 hour  Intake      0 ml  Output      0 ml  Net      0 ml   Filed Weights   02/09/13 2050 02/10/13 0500  Weight: 99.247 kg (218 lb 12.8 oz) 99.927 kg (220 lb 4.8 oz)    Exam:   General:  A&O, NAD, Lying comfortably in bed, appears learning disabled.  Cardiovascular: RRR, no murmurs, rubs or gallops, no lower extremity edema  Respiratory: CTA, no wheeze, crackles, or rales.  No increased work of breathing.  Abdomen: Soft, non-tender, non-distended, + bowel sounds, no masses  Neuro:  Stuttering, slow, clear speech, right sided facial droop.Non focal-moves all 4 ext-but with gen weakness   Data Reviewed: Basic Metabolic Panel:  Recent Labs Lab 02/09/13 1619 02/09/13 1627 02/10/13 0915  NA 145 146* 142  K 2.8* 3.0* 3.1*  CL 105 106 103  CO2 25  --  24  GLUCOSE 51* 51* 175*  BUN 8 6 5*  CREATININE 0.68 0.70 0.62  CALCIUM 9.7  --  9.2   Liver Function Tests:  Recent Labs Lab  02/09/13 1619  AST 38*  ALT 34  ALKPHOS 109  BILITOT 0.6  PROT 7.5  ALBUMIN 4.0   CBC:  Recent Labs Lab 02/09/13 1619 02/09/13 1627 02/10/13 0915  WBC 13.6*  --  11.1*  NEUTROABS 8.2*  --   --   HGB 13.5 13.3 12.6  HCT 37.5 39.0 36.5  MCV 81.9  --  83.3  PLT 302  --  326   Cardiac Enzymes:  Recent Labs Lab 02/09/13 1619  TROPONINI <0.30   CBG:  Recent Labs Lab 02/10/13 0204 02/10/13 0311 02/10/13 0559 02/10/13 0802 02/10/13 1121  GLUCAP 107* 111* 137* 151* 181*     Studies: Dg Chest 1 View  02/09/2013   *RADIOLOGY REPORT*  Clinical Data: Code stroke.  CHEST - 1 VIEW  Comparison: 05/06/2012.  Findings: Normal heart size and mediastinal contours.  Azygos fissure.  Lungs clear and well aerated.  No effusion or pneumothorax. Deficiency of the lateral right clavicle, likely postoperative. Lower cervical ACDF with plate screw fixation.  IMPRESSION: No evidence of acute cardiopulmonary disease.   Original Report Authenticated By: Jorje Guild   Dg Knee 2 Views Left  02/09/2013   *RADIOLOGY REPORT*  Clinical Data: Code stroke.  Status post fall.  LEFT KNEE - 1-2 VIEW  Comparison: None.  Findings: No joint effusion.  No fracture or subluxation.  No radiopaque foreign bodies or soft tissue calcifications.  IMPRESSION:  1.  No acute findings.   Original Report Authenticated By: Kerby Moors, M.D.   Ct Head Wo Contrast  02/09/2013   *RADIOLOGY REPORT*  Clinical Data: Facial droop and numbness.  Speech disturbance.  CT HEAD WITHOUT CONTRAST  Technique:  Contiguous axial images were obtained from the base of the skull through the vertex without contrast.  Comparison: MRI 05/17/2010.  Findings: The brain has a normal appearance without evidence of atrophy, old or acute infarction, mass lesion, hemorrhage, hydrocephalus or extra-axial collection.  No inflammatory sinus disease.  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  No calvarial abnormality.  These results were called by  telephone on 02/09/2013 at 1625 hours to code stroke team Dr. Nicole Kindred, who verbally acknowledged these results.  IMPRESSION: Normal head CT.   Original Report Authenticated By: Nelson Chimes, M.D.    Scheduled Meds: . aspirin EC  81 mg Oral Daily  . bumetanide  1 mg Oral BID  . diltiazem  240 mg Oral Daily  . enoxaparin (LOVENOX) injection  40 mg Subcutaneous Q24H  . gabapentin  800 mg Oral QID  . insulin aspart  0-20 Units Subcutaneous TID WC  . levothyroxine  112 mcg Oral QAC breakfast  . montelukast  10 mg Oral QHS  . nebivolol  5 mg Oral Daily  . sodium chloride  3 mL Intravenous Q12H  . torsemide  10 mg Oral Daily   Continuous  Infusions: . dextrose 5 % and 0.9% NaCl 75 mL/hr at 02/09/13 1829    Principal Problem:   Hypoglycemia Active Problems:   IDDM   Essential hypertension, benign   Chronic fatigue syndrome   Polyarthropathy associated with another disorder   Hypokalemia    Karen Kitchens [facility] Pager [phone]. If 7PM-7AM, please contact night-coverage at [email or web address], password [facility] 02/10/2013, 12:20 PM  LOS: 1 day   Attending Patient seen and examined, agree with the assessment and plan as outlined above. Patient admitted after a fall-no syncope. She was found to be hypoglycemic, and also with right facial droop. She was evaluated by neuro who felt her right facial droop and ?right sided weakness were mostly functional. She is doing well overnight, still with a right facial droop, CBG's stable, plan is to stop D5 today-start SSI and await SSI. She needs a PT eval, and needs to minimize some of her medications. Further plan will depend as patient's clinical course evolves  S Ghimire

## 2013-02-10 NOTE — Care Management Note (Unsigned)
    Page 1 of 1   02/10/2013     4:29:16 PM   CARE MANAGEMENT NOTE 02/10/2013  Patient:  Wyvonnia Dusky   Account Number:  [id number]  Date Initiated:  02/10/2013  Documentation initiated by:  [name]  Subjective/Objective Assessment:   dx hypoglycemia  admit-lives with daughter, uses a walker at home.     Action/Plan:   Anticipated DC Date:  02/12/2013   Anticipated DC Plan:  [place]         Choice offered to / List presented to:             Status of service:  In process, will continue to follow Medicare Important Message given?   (If response is "NO", the following Medicare IM given date fields will be blank) Date Medicare IM given:   Date Additional Medicare IM given:    Discharge Disposition:    Per UR Regulation:  Reviewed for med. necessity/level of care/duration of stay  If discussed at [place] of Stay Meetings, dates discussed:    Comments:  02/10/13 16:14 [name] RN, BSN [phone] patient lives with daughter,  Pt states she does not want to go to a snf or a ALF, she does not have money for private duty sitters, she will need hh services.  Await pt/ot eval.   NCM will continue to follow for dc needs.

## 2013-02-10 NOTE — Progress Notes (Signed)
MEDICATION RELATED CONSULT NOTE - INITIAL   Pharmacy Consult for Medication Review Indication: Side effects for stuttering/facial drooping- Polypharmacy  Patient Measurements: Height: 5' 2"  (157.5 cm) Weight: 220 lb 4.8 oz (99.927 kg) IBW/kg (Calculated) : 50.1   Vital Signs: Temp: 98.6 F (37 C) (07/17 0500) Temp src: Oral (07/17 0500) BP: 125/84 mmHg (07/17 1130) Pulse Rate: 103 (07/17 1130)  Labs:  Recent Labs  02/09/13 1619 02/09/13 1627 02/10/13 0915  WBC 13.6*  --  11.1*  HGB 13.5 13.3 12.6  HCT 37.5 39.0 36.5  PLT 302  --  326  APTT 26  --   --   CREATININE 0.68 0.70 0.62  ALBUMIN 4.0  --   --   PROT 7.5  --   --   AST 38*  --   --   ALT 34  --   --   ALKPHOS 109  --   --   BILITOT 0.6  --   --    Estimated Creatinine Clearance: 97.1 ml/min (by C-G formula based on Cr of 0.62).   Medical History: Past Medical History  Diagnosis Date  . GERD (gastroesophageal reflux disease)   . Chronic airway obstruction, not elsewhere classified   . Asthma   . Hyperlipidemia   . Hypothyroidism   . Depression   . Gastroparesis     from DM and chronic narcotic use  . DDD (degenerative disc disease)     CERVIAL AND LUMBAR  . Edema   . Human parvovirus infection   . 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 Fatty liver disease, nonalcoholic   . Positive PPD   . Vitamin D deficiency   . Vocal cord dysfunction   . PONV (postoperative nausea and vomiting)   . Hypertension   . Shortness of breath     "at anytime; it's gotten worse recently" (02/09/2013)  . OSA (obstructive sleep apnea)     "have mask;; don't use it; no one came out to check it" (02/09/2013)  . Type II diabetes mellitus   . Fibromyalgia     "severe" (02/09/2013)  . Peripheral neuropathy     "severe" (02/09/2013)  . Migraine headache     "weekly; worse lately" (02/09/2013)  . Chronic pain     "q where; herniated disc in my tailbone" (02/09/2013)  .  Anxiety   . Interstitial cystitis     Medications:  Prescriptions prior to admission  Medication Sig Dispense Refill  . albuterol (PROVENTIL HFA;VENTOLIN HFA) 108 (90 BASE) MCG/ACT inhaler Inhale 2 puffs into the lungs every 6 (six) hours as needed for wheezing. For wheezing      . albuterol (PROVENTIL) (2.5 MG/3ML) 0.083% nebulizer solution Take 2.5 mg by nebulization every 6 (six) hours as needed. For wheezing      . amitriptyline (ELAVIL) 50 MG tablet Take 50 mg by mouth at bedtime.      . bumetanide (BUMEX) 1 MG tablet Take 1 mg by mouth 2 (two) times daily.       . diazepam (VALIUM) 5 MG tablet Take 10 mg by mouth at bedtime.       [name] diltiazem (CARDIZEM CD) 240 MG 24 hr capsule Take 240 mg by mouth daily.        [name] gabapentin (NEURONTIN) 800 MG tablet Take 800 mg by mouth 4 (four) times daily.       Vanessa Kick Ethyl (VASCEPA) 1 G CAPS Take 2 capsules  by mouth 2 (two) times daily.      . insulin glargine (LANTUS) 100 UNIT/ML injection Inject 50 Units into the skin at bedtime as needed. For high blood sugar      . insulin regular human CONCENTRATED (HUMULIN R) 500 UNIT/ML SOLN injection Inject 10-30 Units into the skin daily. Sliding scale as directed      . ipratropium-albuterol (DUONEB) 0.5-2.5 (3) MG/3ML SOLN Take 3 mLs by nebulization 2 (two) times daily as needed. For wheezing      . levothyroxine (SYNTHROID, LEVOTHROID) 112 MCG tablet Take 112 mcg by mouth daily.        . Liraglutide (VICTOZA) 18 MG/3ML SOPN Inject 1.2 mg into the skin daily.       . montelukast (SINGULAIR) 10 MG tablet Take 1 tablet (10 mg total) by mouth at bedtime.  30 tablet  11  . nebivolol (BYSTOLIC) 5 MG tablet Take 5 mg by mouth daily.      [name] omeprazole (PRILOSEC) 40 MG capsule Take 40 mg by mouth 2 (two) times daily.       . pramipexole (MIRAPEX) 0.5 MG tablet Take 0.5 mg by mouth 2 (two) times daily.      . promethazine (PHENERGAN) 12.5 MG tablet Take 12.5 mg by mouth every 6 (six) hours as needed for  nausea. For nausea      . rosuvastatin (CRESTOR) 40 MG tablet Take 40 mg by mouth every evening.       . sulindac (CLINORIL) 200 MG tablet Take 200 mg by mouth 2 (two) times daily.      [name] tiZANidine (ZANAFLEX) 4 MG capsule Take 4 mg by mouth 3 (three) times daily as needed. For muscle spasms      . torsemide (DEMADEX) 20 MG tablet Take 10 mg by mouth daily. 1/2 tab by mouth twice daily      . Vitamin D, Ergocalciferol, (DRISDOL) 50000 UNITS CAPS Take 50,000 Units by mouth every 7 (seven) days. Takes on Wed      . MONOJECT INS SYR 1CC/30G 30G X 5/16" 1 ML MISC Use as directed to administer insulin      . Nebulizers (NEBULIZER COMPRESSOR) MISC Use up to 4 times daily as needed  1 each  0  . ONE TOUCH ULTRA TEST test strip Use as directed to check blood glucose      . SUMAtriptan (IMITREX) 100 MG tablet Take 100 mg by mouth as needed. For migraine        Assessment: Patient presented w/ weakness, R facial droop, and slurred speech. Pharmacy consulted for medication review for possible cause of patients symptoms and polypharmacy.  Drugs that may attribute to patient symptoms: -Gabapentin (peripheral neuropathy/fibromyalagia)- >10% cause of dizzness/drowsiness; case reports of nerve palsy -Amitriptyline (depression/)- may cause cognitive dysfunction, coma, confusion, numbness, paresthesia, peripheral neuropathy, tingling of extremities -diazepam- confusion, depression, drowsiness, fatigue, headache, slurred speech, paradoxical reactions  -mirapex (PTA)- blackouts, confusion, hypotension  Recommendation:  1. Patient is on several medications that could lead to potential confusion/dizziness. Gabapentin was the only medication actually noted to have case report for potential nerve palsy. 2. Patient is also on several medication that could cause fall. From hypotensive medications to hypoglycemic agents. I would recommend d/c one of patient loop diuretic as it is duplicate medication. Consider  increasing to 72m BID of torsemide and d/c bumex.  3. I would recommend reviewing actual indications for gabapentin, amitriptyline, and mirapex, to ensure patient is not receiving duplicate medications.  PGwendlyn Deutscher  M 02/10/2013,3:16 PM

## 2013-02-10 NOTE — Progress Notes (Signed)
Utilization review completed. Sadiq Mccauley, RN, BSN. 

## 2013-02-10 NOTE — Progress Notes (Signed)
Inpatient Diabetes Program Recommendations  AACE/ADA: New Consensus Statement on Inpatient Glycemic Control (2013)  Target Ranges:  Prepandial:   less than 140 mg/dL      Peak postprandial:   less than 180 mg/dL (1-2 hours)      Critically ill patients:  140 - 180 mg/dL   Results for Cheryl [name] (MRN [id number]) as of 02/10/2013 11:52  Ref. Range 02/10/2013 00:06 02/10/2013 01:20 02/10/2013 02:04 02/10/2013 03:11 02/10/2013 05:59 02/10/2013 08:02  Glucose-Capillary Latest Range: 70-99 mg/dL 93 98 107 (H) 111 (H) 137 (H) 151 (H)    Inpatient Diabetes Program Recommendations Correction (SSI): Please order CBGs ACHS with Novolog correction scale.  Note: Patient has a history of diabetes and according to the medication list, patient takes Lantus 50 units QHS, U500 10-30 units daily, and Victoza 1.2 mg daily at home for diabetes management.  Currently, patient is not ordered to receive any medications for inpatient glycemic control but glucose is being checked frequently.  Visited with the patient to discuss diabetes control and home regimen.  According to the patient she has been taking U500, Lantus, and Victoza for a while.  The most recent was the Victoza over 6 months ago, which she went on because her blood glucose was staying between 300-500 mg/dl consistently.  She sees Dr. [name] for diabetes management and has an appointment scheduled with him on August 5th.  She reports that he last A1C a couple of months ago was 6.5%.  According to the patient she checks her blood glucose and depending on what her blood sugar is she may or may not take the insulins as prescribed.  She reports that she takes U500 5-30 units with meals (only if she is going to eat).  However, she reports that she takes U500 sometimes and then forgets to eat.  Patient has hypoglycemia unawareness, as she reports that when her blood glucose was in the 30's yesterday and she could not tell her blood glucose was low and she did not have  any symptoms of hypoglycemia.  With hypoglycemia unawareness, it is likely that patient is having such frequent lows that she is not able to recognize/feel effects of low blood glucose and will likely need her home diabetes management plan revised.    Blood glucose over the past 12 hours has stabilized and is now >150 mg/dl.  Please order CBGs ACHS with Novolog correction scale.  Will likely need basal insulin resumed, but when it is restarted recommend it be started at a much lower dose than used as an outpatient. Will continue to follow.  Thanks, Barnie Alderman, RN, MSN, CCRN Diabetes Coordinator Inpatient Diabetes Program [phone]

## 2013-02-11 DIAGNOSIS — G43909 Migraine, unspecified, not intractable, without status migrainosus: Secondary | ICD-10-CM

## 2013-02-11 LAB — GLUCOSE, CAPILLARY
Glucose-Capillary: 45 mg/dL — ABNORMAL LOW (ref 70–99)
Glucose-Capillary: 57 mg/dL — ABNORMAL LOW (ref 70–99)
Glucose-Capillary: 96 mg/dL (ref 70–99)
Glucose-Capillary: 200 mg/dL — ABNORMAL HIGH (ref 70–99)
Glucose-Capillary: 231 mg/dL — ABNORMAL HIGH (ref 70–99)
Glucose-Capillary: 217 mg/dL — ABNORMAL HIGH (ref 70–99)
Glucose-Capillary: 213 mg/dL — ABNORMAL HIGH (ref 70–99)
Glucose-Capillary: 319 mg/dL — ABNORMAL HIGH (ref 70–99)

## 2013-02-11 LAB — BASIC METABOLIC PANEL
BUN: 10 mg/dL (ref 6–23)
CO2: 26 mEq/L (ref 19–32)
Calcium: 9.3 mg/dL (ref 8.4–10.5)
Chloride: 100 mEq/L (ref 96–112)
Creatinine, Ser: 0.71 mg/dL (ref 0.50–1.10)
GFR calc Af Amer: >90 mL/min (ref >90)
GFR calc non Af Amer: >90 mL/min (ref >90)
Glucose, Bld: 253 mg/dL — ABNORMAL HIGH (ref 70–99)
Potassium: 3.5 mEq/L (ref 3.5–5.1)
Sodium: 138 mEq/L (ref 135–145)

## 2013-02-11 MED ORDER — SULINDAC 200 MG PO TABS
200.0000 mg | ORAL_TABLET | Freq: Two times a day (BID) | ORAL | Status: DC
  Administered 2013-02-11 – 2013-02-12 (×3): 200 mg via ORAL
  Filled 2013-02-11 (×4): qty 1

## 2013-02-11 MED ORDER — INSULIN GLARGINE 100 UNIT/ML ~~LOC~~ SOLN
20.0000 [IU] | Freq: Every day | SUBCUTANEOUS | Status: DC
  Administered 2013-02-11: 20 [IU] via SUBCUTANEOUS
  Filled 2013-02-11 (×2): qty 0.2

## 2013-02-11 MED ORDER — SUMATRIPTAN SUCCINATE 100 MG PO TABS
100.0000 mg | ORAL_TABLET | ORAL | Status: DC | PRN
  Filled 2013-02-11: qty 1

## 2013-02-11 MED ORDER — DIAZEPAM 5 MG PO TABS
5.0000 mg | ORAL_TABLET | Freq: Every day | ORAL | Status: DC
  Administered 2013-02-11: 5 mg via ORAL
  Filled 2013-02-11: qty 1

## 2013-02-11 MED ORDER — TIZANIDINE HCL 4 MG PO TABS
4.0000 mg | ORAL_TABLET | Freq: Three times a day (TID) | ORAL | Status: DC | PRN
  Filled 2013-02-11: qty 1

## 2013-02-11 MED ORDER — BUMETANIDE 1 MG PO TABS
1.0000 mg | ORAL_TABLET | Freq: Every day | ORAL | Status: DC
  Administered 2013-02-12: 1 mg via ORAL
  Filled 2013-02-11: qty 1

## 2013-02-11 MED ORDER — LIDOCAINE 5 % EX PTCH
1.0000 | MEDICATED_PATCH | CUTANEOUS | Status: DC
  Administered 2013-02-11: 1 via TRANSDERMAL
  Filled 2013-02-11 (×2): qty 1

## 2013-02-11 NOTE — Progress Notes (Signed)
PATIENT DETAILS Name: Cheryl [name] Age: 47 y.o. Sex: female Date of Birth: [birth year] Admit Date: 02/09/2013 Admitting Physician Derrill Kay, MD [id number], Jaymes Graff, MD  Subjective: Feels better-however complains of mild back pain  Assessment/Plan: Hypoglycemia  -Secondary to taking insulin (U500) but then not eating.She is on multiple insulin regimen at home with Lantus and U500, also on Victoza -on admission started on D5 ivf, D50 given PRN, CBG's now stable off IVF.Now on SSI -A1C is 5.5-Suspect she does not need a whole lot of insulin now-will restart Lantus at 20 untis today-takes 50 units at home -do not plan to resume Insulin U500, Vicotza at this time  Neurological abnomalities: Slurred/stuttering speech / facial droop -seen by Neuro on admission-thought to be 2/2 Hypoglycemia and functional -exam unchanged from yesterday-apperas more generally weak rather than focally -spoke with Dr Aram Beecham again on 7/17-who advised against further work up -await PT eval -Ct head neg  HTN -controlled -decreased Bumex to daily, stopped Demadex -c/w Diltiazem and Bystolic  Chronic Back Pain -resume Sulindac-stop Ibuprofen -Start Lidoderm patches  Neuropathy -stable -c/w Neurontin  Hypothyroidism -c/w Levothyroxine  Polypharmacy -appreciate pharmacy input -will need to minimize medications -takes Valium daily-will resume at half dose-to prevent withdrawal seizures  Migraine -prn Sumatriptan -currently with no headache  Disposition: Remain inpatient-needs PT eval-re-orderd today to determine disposition  DVT Prophylaxis: Prophylactic Lovenox  Code Status: Full code   Family Communication None  Procedures:  None  CONSULTS:  None   MEDICATIONS: Scheduled Meds: . albuterol  2.5 mg Nebulization BID  . aspirin EC  81 mg Oral Daily  . bumetanide  1 mg Oral BID  . diltiazem  240 mg Oral Daily  . enoxaparin (LOVENOX) injection  40 mg Subcutaneous Q24H  .  gabapentin  800 mg Oral QID  . insulin aspart  0-15 Units Subcutaneous TID WC  . ipratropium  0.5 mg Nebulization BID  . levothyroxine  112 mcg Oral QAC breakfast  . montelukast  10 mg Oral QHS  . nebivolol  5 mg Oral Daily  . sodium chloride  3 mL Intravenous Q12H   Continuous Infusions:  PRN Meds:.albuterol, alum & mag hydroxide-simeth, ibuprofen, ondansetron (ZOFRAN) IV, ondansetron  Antibiotics: Anti-infectives   None       PHYSICAL EXAM: Vital signs in last 24 hours: Filed Vitals:   02/11/13 0625 02/11/13 0733 02/11/13 1005 02/11/13 1143  BP: 103/72  109/75   Pulse: 79   90  Temp: 98.1 F (36.7 C)     TempSrc: Oral     Resp: 16     Height:      Weight:      SpO2: 93% 95%  93%    Weight change: 0.272 kg (9.6 oz) Filed Weights   02/09/13 2050 02/10/13 0500 02/11/13 0500  Weight: 99.247 kg (218 lb 12.8 oz) 99.927 kg (220 lb 4.8 oz) 99.519 kg (219 lb 6.4 oz)   Body mass index is 40.12 kg/(m^2).   Gen Exam: Awake and alert with clear but slow speech.   Neck: Supple, No JVD.   Chest: B/L Clear.   CVS: S1 S2 Regular, no murmurs.  Abdomen: soft, BS +, non tender, non distended.  Extremities: no edema, lower extremities warm to touch. Neurologic: Non Focal-but generally weak all over Skin: No Rash.   Wounds: N/A.    Intake/Output from previous day:  Intake/Output Summary (Last 24 hours) at 02/11/13 1242 Last data filed at 02/10/13 2322  Gross per 24 hour  Intake  240 ml  Output    600 ml  Net   -360 ml     LAB RESULTS: CBC  Recent Labs Lab 02/09/13 1619 02/09/13 1627 02/10/13 0915  WBC 13.6*  --  11.1*  HGB 13.5 13.3 12.6  HCT 37.5 39.0 36.5  PLT 302  --  326  MCV 81.9  --  83.3  MCH 29.5  --  28.8  MCHC 36.0  --  34.5  RDW 15.7*  --  16.1*  LYMPHSABS 4.0  --   --   MONOABS 0.7  --   --   EOSABS 0.7  --   --   BASOSABS 0.1  --   --     Chemistries   Recent Labs Lab 02/09/13 1619 02/09/13 1627 02/10/13 0915  NA 145 146* 142  K  2.8* 3.0* 3.1*  CL 105 106 103  CO2 25  --  24  GLUCOSE 51* 51* 175*  BUN 8 6 5*  CREATININE 0.68 0.70 0.62  CALCIUM 9.7  --  9.2    CBG:  Recent Labs Lab 02/10/13 1121 02/10/13 1717 02/10/13 2123 02/11/13 0808 02/11/13 1216  GLUCAP 181* 196* 228* 200* 231*    GFR Estimated Creatinine Clearance: 97 ml/min (by C-G formula based on Cr of 0.62).  Coagulation profile  Recent Labs Lab 02/09/13 1619  INR 1.00    Cardiac Enzymes  Recent Labs Lab 02/09/13 1619  TROPONINI <0.30    No components found with this basename: POCBNP,  No results found for this basename: DDIMER,  in the last 72 hours  Recent Labs  02/10/13 0915  HGBA1C 5.5   No results found for this basename: CHOL, HDL, LDLCALC, TRIG, CHOLHDL, LDLDIRECT,  in the last 72 hours No results found for this basename: TSH, T4TOTAL, FREET3, T3FREE, THYROIDAB,  in the last 72 hours No results found for this basename: VITAMINB12, FOLATE, FERRITIN, TIBC, IRON, RETICCTPCT,  in the last 72 hours No results found for this basename: LIPASE, AMYLASE,  in the last 72 hours  Urine Studies No results found for this basename: UACOL, UAPR, USPG, UPH, UTP, UGL, UKET, UBIL, UHGB, UNIT, UROB, ULEU, UEPI, UWBC, URBC, UBAC, CAST, CRYS, UCOM, BILUA,  in the last 72 hours  MICROBIOLOGY: No results found for this or any previous visit (from the past 240 hour(s)).  RADIOLOGY STUDIES/RESULTS: Dg Chest 1 View  02/09/2013   *RADIOLOGY REPORT*  Clinical Data: Code stroke.  CHEST - 1 VIEW  Comparison: 05/06/2012.  Findings: Normal heart size and mediastinal contours.  Azygos fissure.  Lungs clear and well aerated.  No effusion or pneumothorax. Deficiency of the lateral right clavicle, likely postoperative. Lower cervical ACDF with plate screw fixation.  IMPRESSION: No evidence of acute cardiopulmonary disease.   Original Report Authenticated By: Jorje Guild   Dg Knee 2 Views Left  02/09/2013   *RADIOLOGY REPORT*  Clinical Data: Code  stroke.  Status post fall.  LEFT KNEE - 1-2 VIEW  Comparison: None.  Findings: No joint effusion.  No fracture or subluxation.  No radiopaque foreign bodies or soft tissue calcifications.  IMPRESSION:  1.  No acute findings.   Original Report Authenticated By: Kerby Moors, M.D.   Ct Head Wo Contrast  02/09/2013   *RADIOLOGY REPORT*  Clinical Data: Facial droop and numbness.  Speech disturbance.  CT HEAD WITHOUT CONTRAST  Technique:  Contiguous axial images were obtained from the base of the skull through the vertex without contrast.  Comparison: MRI 05/17/2010.  Findings:  The brain has a normal appearance without evidence of atrophy, old or acute infarction, mass lesion, hemorrhage, hydrocephalus or extra-axial collection.  No inflammatory sinus disease.  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  No calvarial abnormality.  These results were called by telephone on 02/09/2013 at 1625 hours to code stroke team Dr. Nicole Kindred, who verbally acknowledged these results.  IMPRESSION: Normal head CT.   Original Report Authenticated By: Nelson Chimes, M.D.    Oren Binet, MD  [facility] Pager:[phone]  If 7PM-7AM, please contact night-coverage [email or web address] Password [email or web address] 02/11/2013, 12:42 PM   LOS: 2 days

## 2013-02-11 NOTE — Evaluation (Signed)
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  Pt was taking a shower earlier and got very weak fell to the ground.  No loc.  No cp.  No sob.  Her glucose was less than 50.  Her pcp recently started her on victoza and pt says she has no appetite with this and skips meals.  She had not eaten all day.  No fevers.  No n/v/d.  On arrival to ED code stroke was initiated.  Now pt glucose is still low, on d10 gtt.  Her mental status has improved.  No abd pain.  She did hurt her left knee when she fell in the shower but other than that pain has no pain and no trouble moving all extremities.  No drooling.  She has peripheral neuropathy which is stable  Clinical Impression  Patient presents with decreased independence with mobility and high fall risk due to impaired balance, slow gait speed and issues with medical compliance.  She will need 24 hour assist initially upon d/c home which she states daughter can provide.  Also recommend HHPT and educated pt to use her walker full time in the home to decrease fall risk.  Pt to follow acutely to address issues noted below.    PT Assessment  Patient needs continued PT services    Follow Up Recommendations  Home health PT;Supervision/Assistance - 24 hour (at least initial 24 hour assist)          Equipment Recommendations  None recommended by PT       Frequency Min 3X/week    Precautions / Restrictions Precautions Precautions: Fall   Pertinent Vitals/Pain 8/10 "tailbone" and left hip      Mobility  Bed Mobility Details for Bed Mobility Assistance: Pt up in recliner upon arrival Transfers Sit to Stand: 4: Min guard;With upper extremity assist;With armrests;From  chair/3-in-1 Stand to Sit: 4: Min guard;With upper extremity assist;With armrests;To chair/3-in-1 Details for Transfer Assistance: VCs for safe hand placement Ambulation/Gait Ambulation/Gait Assistance: 5: Supervision Ambulation Distance (Feet): 90 Feet Assistive device: Rolling walker Ambulation/Gait Assistance Details: extremely short step length with left foot/hip rotated externally Gait Pattern: Step-to pattern;Decreased stride length;Decreased trunk rotation;Lateral trunk lean to left;Wide base of support;Shuffle;Antalgic Gait velocity: very slow (likely less than 0.5 ft/sec)        PT Diagnosis: Generalized weakness;Abnormality of gait  PT Problem List: Decreased strength;Decreased activity tolerance;Decreased balance;Decreased mobility;Decreased coordination;Decreased knowledge of use of DME;Decreased safety awareness PT Treatment Interventions: DME instruction;Balance training;Gait training;Functional mobility training;Patient/family education;Therapeutic activities;Therapeutic exercise;Stair training     PT Goals(Current goals can be found in the care plan section) Acute Rehab PT Goals Patient Stated Goal: To not have anyone need to take care of me, fix my own meals PT Goal Formulation: With patient Time For Goal Achievement: 02/25/13 Potential to Achieve Goals: Good  Visit Information  Last PT Received On: 02/11/13 Assistance Needed: +1 PT/OT Co-Evaluation/Treatment: Yes History of Present Illness: 47 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  Pt was taking a shower earlier and got very weak fell to the ground.  No loc.  No cp.  No sob.  Her glucose was less than 50.  Her pcp recently started her on victoza and pt  says she has no appetite with this and skips meals.  She had not eaten all day.  No fevers.  No n/v/d.  On arrival to ED code stroke was initiated.  Now pt glucose is still low, on d10 gtt.  Her mental status has improved.   No abd pain.  She did hurt her left knee when she fell in the shower but other than that pain has no pain and no trouble moving all extremities.  No drooling.  She has peripheral neuropathy which is stable       Prior [place] expects to be discharged to:: Private residence Living Arrangements: Children Available Help at Discharge: Available PRN/intermittently Type of Home: House Home Access: Stairs to enter Technical brewer of Steps: 4-5 Entrance Stairs-Rails: Left;Right;Can reach both Home Layout: One level Home Equipment: Environmental consultant - 4 wheels Additional Comments: furniture walks in house; takes walker to doctor; tub shower w/curtain and handheld shower w/grabbar; wears glasses for distance Prior Function Level of Independence: Independent with assistive device(s) Communication Communication: Expressive difficulties Dominant Hand: Right    Cognition  Cognition Arousal/Alertness: Awake/alert Behavior During Therapy: Anxious Overall Cognitive Status: Within Functional Limits for tasks assessed    Extremity/Trunk Assessment Upper Extremity Assessment Upper Extremity Assessment: Defer to OT evaluation Lower Extremity Assessment Lower Extremity Assessment: RLE deficits/detail;LLE deficits/detail RLE Deficits / Details: AROM; strength grossly tested 4-/5 with tremors intermittently LLE Deficits / Details: AROM; strength grossly tested 4-/5 with tremors intermittently; pt report h/o surgery left hip due to "tibial band" issues; states has left inner thigh pain as well   Balance Balance Balance Assessed: Yes Dynamic Standing Balance Dynamic Standing - Balance Support: During functional activity;No upper extremity supported Dynamic Standing - Level of Assistance: 5: Stand by assistance Dynamic Standing - Comments: bracing self against sink to brush teeth about 3 minutes  End of Session PT - End of Session Equipment Utilized During Treatment: Gait  belt Activity Tolerance: Patient limited by fatigue Patient left: in chair;with call bell/phone within reach Nurse Communication: Mobility status  GP     [facility] 02/11/2013, 5:23 PM Magda Kiel, [place] 02/11/2013

## 2013-02-11 NOTE — Evaluation (Signed)
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  Pt was taking a shower earlier and got very weak fell to the ground.  No loc.  No cp.  No sob.  Her glucose was less than 50.  Her pcp recently started her on victoza and pt says she has no appetite with this and skips meals.  She had not eaten all day.  No fevers.  No n/v/d.  On arrival to ED code stroke was initiated.  Now pt glucose is still low, on d10 gtt.  Her mental status has improved.  No abd pain.  She did hurt her left knee when she fell in the shower but other than that pain has no pain and no trouble moving all extremities.  No drooling.  She has peripheral neuropathy which is stable   Clinical Impression   Pt admitted with above. Also having pain in left inner thigh at rest and left hip with ambulation. Pt currently with functional limitations due to the deficits listed below (see OT Problem List) Pt will benefit from skilled OT to increase their safety and independence with ADL and functional mobility for ADL to facilitate discharge to venue listed below.       OT Assessment  Patient needs continued OT Services    Follow Up Recommendations  Home health OT       Equipment Recommendations  Tub/shower seat (pt to get on her own)       Frequency  Min 2X/week    Precautions / Restrictions Precautions Precautions: Fall Restrictions Weight Bearing Restrictions: No   Pertinent Vitals/Pain 8/10 left hip    ADL  Eating/Feeding: Simulated;Set up (due to IV in right hand) Where Assessed - Eating/Feeding: Chair Grooming: Performed;Teeth care;Min guard Where Assessed - Grooming: Unsupported standing Upper Body Bathing: Simulated;Set up Where  Assessed - Upper Body Bathing: Unsupported sitting Lower Body Bathing: Simulated;Maximal assistance Where Assessed - Lower Body Bathing: Unsupported sit to stand Upper Body Dressing: Simulated;Set up Where Assessed - Upper Body Dressing: Unsupported sitting Lower Body Dressing: Simulated;+1 Total assistance Where Assessed - Lower Body Dressing: Unsupported sit to stand Toilet Transfer: Simulated;Min guard Toilet Transfer Method: Sit to Loss adjuster, chartered:  (Bed>sink>out door>down hallway>back to recliner) Toileting - Water quality scientist and Hygiene: Simulated;Min guard Where Assessed - Best boy and Hygiene: Standing Equipment Used: Gait belt;Rolling walker Transfers/Ambulation Related to ADLs: Min guard A for all with RW    OT Diagnosis: Generalized weakness;Acute pain  OT Problem List: Decreased strength;Impaired balance (sitting and/or standing);Pain;Decreased knowledge of use of DME or AE OT Treatment Interventions: Self-care/ADL training;Balance training;Patient/family education;DME and/or AE instruction   OT Goals(Current goals can be found in the care plan section) Acute Rehab OT Goals Patient Stated Goal: To not have anyone need to take care of me, fix my own meals OT Goal Formulation: With patient Time For Goal Achievement: 02/18/13 Potential to Achieve Goals: Good ADL Goals Pt Will Perform Lower Body Dressing: with set-up;with supervision;with adaptive equipment;sit to/from stand  Visit Information  Last OT Received On: 02/11/13 Assistance Needed: +1 PT/OT Co-Evaluation/Treatment: Yes History of Present Illness: 47 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  Pt was taking a shower earlier and got very weak fell  to the ground.  No loc.  No cp.  No sob.  Her glucose was less than 50.  Her pcp recently started her on victoza and pt says she has no appetite with this and skips meals.  She had not  eaten all day.  No fevers.  No n/v/d.  On arrival to ED code stroke was initiated.  Now pt glucose is still low, on d10 gtt.  Her mental status has improved.  No abd pain.  She did hurt her left knee when she fell in the shower but other than that pain has no pain and no trouble moving all extremities.  No drooling.  She has peripheral neuropathy which is stable       Prior [place] expects to be discharged to:: Private residence Living Arrangements: Children (daughter (24)) Available Help at Discharge: Available PRN/intermittently Type of Home: House Home Access: Stairs to enter Technical brewer of Steps: 4-5 Entrance Stairs-Rails: Left;Right;Can reach both Home Layout: One level Home Equipment: Environmental consultant - 4 wheels Additional Comments: furniture walks in house; takes walker to doctor; tub shower w/curtain and handheld shower w/grabbar; wears glasses for distance Prior Function Level of Independence: Independent with assistive device(s) Communication Communication: Expressive difficulties (stutters) Dominant Hand: Right         Vision/Perception Vision - History Baseline Vision: Wears glasses for distance only Patient Visual Report: No change from baseline   Cognition  Cognition Arousal/Alertness: Awake/alert Behavior During Therapy: Anxious Overall Cognitive Status: Within Functional Limits for tasks assessed    Extremity/Trunk Assessment Upper Extremity Assessment Upper Extremity Assessment: Overall WFL for tasks assessed (benign essential tremors pta)     Mobility Bed Mobility Details for Bed Mobility Assistance: Pt up in recliner upon arrival Transfers Transfers: Sit to Stand;Stand to Sit Sit to Stand: 4: Min guard;With upper extremity assist;With armrests;From chair/3-in-1 Stand to Sit: 4: Min guard;With upper extremity assist;With armrests;To chair/3-in-1 Details for Transfer Assistance: VCs for safe hand placement            End of Session OT - End of Session Equipment Utilized During Treatment: Gait belt;Rolling walker Activity Tolerance: Patient tolerated treatment well Patient left: in chair;with call bell/phone within reach       Almon Register [phone] 02/11/2013, 12:50 PM

## 2013-02-12 LAB — GLUCOSE, CAPILLARY
Glucose-Capillary: 202 mg/dL — ABNORMAL HIGH (ref 70–99)
Glucose-Capillary: 267 mg/dL — ABNORMAL HIGH (ref 70–99)

## 2013-02-12 MED ORDER — INSULIN ASPART 100 UNIT/ML ~~LOC~~ SOLN: SUBCUTANEOUS | Status: DC

## 2013-02-12 MED ORDER — BUMETANIDE 1 MG PO TABS: 1.0000 mg | ORAL_TABLET | Freq: Every day | ORAL | Status: DC

## 2013-02-12 MED ORDER — LIDOCAINE 5 % EX PTCH: 1.0000 | MEDICATED_PATCH | CUTANEOUS | Status: DC

## 2013-02-12 MED ORDER — INSULIN GLARGINE 100 UNIT/ML ~~LOC~~ SOLN: 22.0000 [IU] | Freq: Every day | SUBCUTANEOUS | Status: DC

## 2013-02-12 MED ORDER — DIAZEPAM 5 MG PO TABS: 5.0000 mg | ORAL_TABLET | Freq: Every day | ORAL | Status: DC

## 2013-02-12 MED ORDER — ASPIRIN 81 MG PO TBEC: 81.0000 mg | DELAYED_RELEASE_TABLET | Freq: Every day | ORAL | Status: DC

## 2013-02-12 NOTE — Progress Notes (Signed)
NURSING PROGRESS NOTE  Cheryl [name] [id number] Discharge Data: 02/12/2013 1:27 PM Attending Provider: No att. providers found FFM:[id number], Jaymes Graff, MD     Cheryl [name] to be D/C'd Home per MD order.    All IV's discontinued with no bleeding noted.  All belongings returned to patient for patient to take home.   Last Vital Signs:  Blood pressure 122/80, pulse 72, temperature 98.2 F (36.8 C), temperature source Oral, resp. rate 20, height 5' 2"  (1.575 m), weight 99.292 kg (218 lb 14.4 oz), SpO2 90.00%.  Discharge Medication List   Medication List    STOP taking these medications       amitriptyline 50 MG tablet  Commonly known as:  ELAVIL     HUMULIN R 500 UNIT/ML Soln injection  Generic drug:  insulin regular human CONCENTRATED     pramipexole 0.5 MG tablet  Commonly known as:  MIRAPEX     torsemide 20 MG tablet  Commonly known as:  DEMADEX     VICTOZA 18 MG/3ML Sopn  Generic drug:  Liraglutide      TAKE these medications       albuterol (2.5 MG/3ML) 0.083% nebulizer solution  Commonly known as:  PROVENTIL  Take 2.5 mg by nebulization every 6 (six) hours as needed. For wheezing     albuterol 108 (90 BASE) MCG/ACT inhaler  Commonly known as:  PROVENTIL HFA;VENTOLIN HFA  Inhale 2 puffs into the lungs every 6 (six) hours as needed for wheezing. For wheezing     aspirin 81 MG EC tablet  Take 1 tablet (81 mg total) by mouth daily.     bumetanide 1 MG tablet  Commonly known as:  BUMEX  Take 1 tablet (1 mg total) by mouth daily.     diazepam 5 MG tablet  Commonly known as:  VALIUM  Take 1 tablet (5 mg total) by mouth at bedtime.     diltiazem 240 MG 24 hr capsule  Commonly known as:  CARDIZEM CD  Take 240 mg by mouth daily.     gabapentin 800 MG tablet  Commonly known as:  NEURONTIN  Take 800 mg by mouth 4 (four) times daily.     insulin aspart 100 UNIT/ML injection  Commonly known as:  novoLOG  - 0-15 Units, Subcutaneous, 3 times daily with  meals  - CBG < 70:call your Doctor  - CBG 70 - 120: 0 units  - CBG 121 - 150: 2 units  - CBG 151 - 200: 3 units  - CBG 201 - 250: 5 units  - CBG 251 - 300: 8 units  - CBG 301 - 350: 11 units  - CBG 351 - 400: 15 units  - CBG > 400, call your doctor     insulin glargine 100 UNIT/ML injection  Commonly known as:  LANTUS  Inject 0.22 mLs (22 Units total) into the skin at bedtime. For high blood sugar     ipratropium-albuterol 0.5-2.5 (3) MG/3ML Soln  Commonly known as:  DUONEB  Take 3 mLs by nebulization 2 (two) times daily as needed. For wheezing     levothyroxine 112 MCG tablet  Commonly known as:  SYNTHROID, LEVOTHROID  Take 112 mcg by mouth daily.     lidocaine 5 %  Commonly known as:  LIDODERM  Place 1 patch onto the skin daily. Remove & Discard patch within 12 hours or as directed by MD     MONOJECT INS SYR 1CC/30G 30G X 5/16" 1 ML  Misc  Generic drug:  Insulin Syringe-Needle U-100  Use as directed to administer insulin     montelukast 10 MG tablet  Commonly known as:  SINGULAIR  Take 1 tablet (10 mg total) by mouth at bedtime.     nebivolol 5 MG tablet  Commonly known as:  BYSTOLIC  Take 5 mg by mouth daily.     Nebulizer Compressor Misc  Use up to 4 times daily as needed     omeprazole 40 MG capsule  Commonly known as:  PRILOSEC  Take 40 mg by mouth 2 (two) times daily.     ONE TOUCH ULTRA TEST test strip  Generic drug:  glucose blood  Use as directed to check blood glucose     promethazine 12.5 MG tablet  Commonly known as:  PHENERGAN  Take 12.5 mg by mouth every 6 (six) hours as needed for nausea. For nausea     rosuvastatin 40 MG tablet  Commonly known as:  CRESTOR  Take 40 mg by mouth every evening.     sulindac 200 MG tablet  Commonly known as:  CLINORIL  Take 200 mg by mouth 2 (two) times daily.     SUMAtriptan 100 MG tablet  Commonly known as:  IMITREX  Take 100 mg by mouth as needed. For migraine     tiZANidine 4 MG capsule   Commonly known as:  ZANAFLEX  Take 4 mg by mouth 3 (three) times daily as needed. For muscle spasms     VASCEPA 1 G Caps  Generic drug:  Icosapent Ethyl  Take 2 capsules by mouth 2 (two) times daily.     Vitamin D (Ergocalciferol) 50000 UNITS Caps  Commonly known as:  DRISDOL  Take 50,000 Units by mouth every 7 (seven) days. Takes on [place], MSN, RN, [employer]

## 2013-02-12 NOTE — Progress Notes (Signed)
   CARE MANAGEMENT NOTE 02/12/2013  Patient:  Cheryl [name]   Account Number:  [id number]  Date Initiated:  02/10/2013  Documentation initiated by:  [name]  Subjective/Objective Assessment:   dx hypoglycemia  admit-lives with daughter, uses a walker at home.     Action/Plan:   Anticipated DC Date:  02/12/2013   Anticipated DC Plan:  [place]  CM consult      [facility] Choice  HOME HEALTH   Choice offered to / List presented to:  C-1 Patient        [place] arranged  HH-1 RN  [place].   Status of service:  Completed, signed off Medicare Important Message given?   (If response is "NO", the following Medicare IM given date fields will be blank) Date Medicare IM given:   Date Additional Medicare IM given:    Discharge Disposition:  [place]  Per UR Regulation:  Reviewed for med. necessity/level of care/duration of stay  If discussed at [place] of Stay Meetings, dates discussed:    Comments:  02/12/2013 1100 NCM spoke to pt and offered choice for [facility] Pt requested [facility] for HH. Faxed referral for [facility]. Jonnie Finner RN CCM Case Mgmt phone [phone]  02/10/13 16:14 [name] RN, BSN [phone] patient lives with daughter,  Pt states she does not want to go to a snf or a ALF, she does not have money for private duty sitters, she will need hh services.  Await pt/ot eval.   NCM will continue to follow for dc needs.

## 2013-02-12 NOTE — Discharge Summary (Addendum)
PATIENT DETAILS Name: Cheryl [name] Age: 47 y.o. Sex: female Date of Birth: 1966-05-27 MRN: [id number]. Admit Date: 02/09/2013 Admitting Physician: Derrill Kay, MD SAY:TKZSW, Jaymes Graff, MD  Recommendations for Outpatient Follow-up:  1. A1C 5.5-admitted with hypoglycemia- please optimize diabetic regimen-for now would allow some permissive hyperglycemia- needs ongoing counseling regarding diet adherence-apparently she does not eat well and takes insulin. 2. On multiple medications that can cause sedation-I have stopped some medications-please continue to minimize sedating medications.  PRIMARY DISCHARGE DIAGNOSIS:  Principal Problem:   Hypoglycemia Active Problems:   IDDM   Essential hypertension, benign   Chronic fatigue syndrome   Polyarthropathy associated with another disorder   Hypokalemia   Chronic right facial droop from Bell's palsy   Chronic back pain      PAST MEDICAL HISTORY: Past Medical History  Diagnosis Date  . GERD (gastroesophageal reflux disease)   . Chronic airway obstruction, not elsewhere classified   . Asthma   . Hyperlipidemia   . Hypothyroidism   . Depression   . Gastroparesis     from DM and chronic narcotic use  . DDD (degenerative disc disease)     CERVIAL AND LUMBAR  . Edema   . Human parvovirus infection   . 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 Fatty liver disease, nonalcoholic   . Positive PPD   . Vitamin D deficiency   . Vocal cord dysfunction   . PONV (postoperative nausea and vomiting)   . Hypertension   . Shortness of breath     "at anytime; it's gotten worse recently" (02/09/2013)  . OSA (obstructive sleep apnea)     "have mask;; don't use it; no one came out to check it" (02/09/2013)  . Type II diabetes mellitus   . Fibromyalgia     "severe" (02/09/2013)  . Peripheral neuropathy     "severe" (02/09/2013)  . Migraine headache     "weekly; worse lately" (02/09/2013)  . Chronic  pain     "q where; herniated disc in my tailbone" (02/09/2013)  . Anxiety   . Interstitial cystitis     DISCHARGE MEDICATIONS:   Medication List    STOP taking these medications       amitriptyline 50 MG tablet  Commonly known as:  ELAVIL     HUMULIN R 500 UNIT/ML Soln injection  Generic drug:  insulin regular human CONCENTRATED     pramipexole 0.5 MG tablet  Commonly known as:  MIRAPEX     torsemide 20 MG tablet  Commonly known as:  DEMADEX     VICTOZA 18 MG/3ML Sopn  Generic drug:  Liraglutide      TAKE these medications       albuterol (2.5 MG/3ML) 0.083% nebulizer solution  Commonly known as:  PROVENTIL  Take 2.5 mg by nebulization every 6 (six) hours as needed. For wheezing     albuterol 108 (90 BASE) MCG/ACT inhaler  Commonly known as:  PROVENTIL HFA;VENTOLIN HFA  Inhale 2 puffs into the lungs every 6 (six) hours as needed for wheezing. For wheezing     aspirin 81 MG EC tablet  Take 1 tablet (81 mg total) by mouth daily.     bumetanide 1 MG tablet  Commonly known as:  BUMEX  Take 1 tablet (1 mg total) by mouth daily.     diazepam 5 MG tablet  Commonly known as:  VALIUM  Take 1 tablet (5 mg total) by mouth at  bedtime.     diltiazem 240 MG 24 hr capsule  Commonly known as:  CARDIZEM CD  Take 240 mg by mouth daily.     gabapentin 800 MG tablet  Commonly known as:  NEURONTIN  Take 800 mg by mouth 4 (four) times daily.     insulin aspart 100 UNIT/ML injection  Commonly known as:  novoLOG  - 0-15 Units, Subcutaneous, 3 times daily with meals  - CBG < 70:call your Doctor  - CBG 70 - 120: 0 units  - CBG 121 - 150: 2 units  - CBG 151 - 200: 3 units  - CBG 201 - 250: 5 units  - CBG 251 - 300: 8 units  - CBG 301 - 350: 11 units  - CBG 351 - 400: 15 units  - CBG > 400, call your doctor     insulin glargine 100 UNIT/ML injection  Commonly known as:  LANTUS  Inject 0.22 mLs (22 Units total) into the skin at bedtime. For high blood sugar      ipratropium-albuterol 0.5-2.5 (3) MG/3ML Soln  Commonly known as:  DUONEB  Take 3 mLs by nebulization 2 (two) times daily as needed. For wheezing     levothyroxine 112 MCG tablet  Commonly known as:  SYNTHROID, LEVOTHROID  Take 112 mcg by mouth daily.     lidocaine 5 %  Commonly known as:  LIDODERM  Place 1 patch onto the skin daily. Remove & Discard patch within 12 hours or as directed by MD     MONOJECT INS SYR 1CC/30G 30G X 5/16" 1 ML Misc  Generic drug:  Insulin Syringe-Needle U-100  Use as directed to administer insulin     montelukast 10 MG tablet  Commonly known as:  SINGULAIR  Take 1 tablet (10 mg total) by mouth at bedtime.     nebivolol 5 MG tablet  Commonly known as:  BYSTOLIC  Take 5 mg by mouth daily.     Nebulizer Compressor Misc  Use up to 4 times daily as needed     omeprazole 40 MG capsule  Commonly known as:  PRILOSEC  Take 40 mg by mouth 2 (two) times daily.     ONE TOUCH ULTRA TEST test strip  Generic drug:  glucose blood  Use as directed to check blood glucose     promethazine 12.5 MG tablet  Commonly known as:  PHENERGAN  Take 12.5 mg by mouth every 6 (six) hours as needed for nausea. For nausea     rosuvastatin 40 MG tablet  Commonly known as:  CRESTOR  Take 40 mg by mouth every evening.     sulindac 200 MG tablet  Commonly known as:  CLINORIL  Take 200 mg by mouth 2 (two) times daily.     SUMAtriptan 100 MG tablet  Commonly known as:  IMITREX  Take 100 mg by mouth as needed. For migraine     tiZANidine 4 MG capsule  Commonly known as:  ZANAFLEX  Take 4 mg by mouth 3 (three) times daily as needed. For muscle spasms     VASCEPA 1 G Caps  Generic drug:  Icosapent Ethyl  Take 2 capsules by mouth 2 (two) times daily.     Vitamin D (Ergocalciferol) 50000 UNITS Caps  Commonly known as:  DRISDOL  Take 50,000 Units by mouth every 7 (seven) days. Takes on Wed        ALLERGIES:   Allergies  Allergen Reactions  . Influenza Vaccines  Throat swelling, "flu" symptoms  . Levofloxacin Anaphylaxis and Hives  . Sulfonamide Derivatives Anaphylaxis  . Biaxin (Clarithromycin) Hives  . Nucynta (Tapentadol Hydrochloride) Other (See Comments)    Halucinations insomnia x3 days  . Vilazodone Hcl Other (See Comments)    Hallucinations  . Amoxicillin Hives  . Ciprofloxacin Hives  . Iohexol      Desc: hives,dyspnea; throat swelling; ok w/ premeds and omnipaque   . Pregabalin Other (See Comments)    Reaction-Syncope & unable to speak  . Percocet (Oxycodone-Acetaminophen) Rash    BRIEF HPI:  See H&P, Labs, Consult and Test reports for all details in brief, patient is a 47 year old white female with a above-noted medical problems was admitted for generalized weakness confusion and a episode of fall. She also had slurred speech. She was found to be hypoglycemic, she has a chronic right facial droop from Bell's palsy. She was admitted for further evaluation and treatment.  CONSULTATIONS:   None  PERTINENT RADIOLOGIC STUDIES: Dg Chest 1 View  02/09/2013   *RADIOLOGY REPORT*  Clinical Data: Code stroke.  CHEST - 1 VIEW  Comparison: 05/06/2012.  Findings: Normal heart size and mediastinal contours.  Azygos fissure.  Lungs clear and well aerated.  No effusion or pneumothorax. Deficiency of the lateral right clavicle, likely postoperative. Lower cervical ACDF with plate screw fixation.  IMPRESSION: No evidence of acute cardiopulmonary disease.   Original Report Authenticated By: Jorje Guild   Dg Knee 2 Views Left  02/09/2013   *RADIOLOGY REPORT*  Clinical Data: Code stroke.  Status post fall.  LEFT KNEE - 1-2 VIEW  Comparison: None.  Findings: No joint effusion.  No fracture or subluxation.  No radiopaque foreign bodies or soft tissue calcifications.  IMPRESSION:  1.  No acute findings.   Original Report Authenticated By: Kerby Moors, M.D.   Ct Head Wo Contrast  02/09/2013   *RADIOLOGY REPORT*  Clinical Data: Facial droop and  numbness.  Speech disturbance.  CT HEAD WITHOUT CONTRAST  Technique:  Contiguous axial images were obtained from the base of the skull through the vertex without contrast.  Comparison: MRI 05/17/2010.  Findings: The brain has a normal appearance without evidence of atrophy, old or acute infarction, mass lesion, hemorrhage, hydrocephalus or extra-axial collection.  No inflammatory sinus disease.  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  No calvarial abnormality.  These results were called by telephone on 02/09/2013 at 1625 hours to code stroke team Dr. Nicole Kindred, who verbally acknowledged these results.  IMPRESSION: Normal head CT.   Original Report Authenticated By: Nelson Chimes, M.D.     PERTINENT LAB RESULTS: CBC:  Recent Labs  02/09/13 1619 02/09/13 1627 02/10/13 0915  WBC 13.6*  --  11.1*  HGB 13.5 13.3 12.6  HCT 37.5 39.0 36.5  PLT 302  --  326   CMET CMP     Component Value Date/Time   NA 138 02/11/2013 1416   K 3.5 02/11/2013 1416   CL 100 02/11/2013 1416   CO2 26 02/11/2013 1416   GLUCOSE 253* 02/11/2013 1416   BUN 10 02/11/2013 1416   CREATININE 0.71 02/11/2013 1416   CALCIUM 9.3 02/11/2013 1416   PROT 7.5 02/09/2013 1619   ALBUMIN 4.0 02/09/2013 1619   AST 38* 02/09/2013 1619   ALT 34 02/09/2013 1619   ALKPHOS 109 02/09/2013 1619   BILITOT 0.6 02/09/2013 1619   GFRNONAA >90 02/11/2013 1416   GFRAA >90 02/11/2013 1416    GFR Estimated Creatinine Clearance: 96.8 ml/min (by C-G  formula based on Cr of 0.71). No results found for this basename: LIPASE, AMYLASE,  in the last 72 hours  Recent Labs  02/09/13 1619  TROPONINI <0.30   No components found with this basename: POCBNP,  No results found for this basename: DDIMER,  in the last 72 hours  Recent Labs  02/10/13 0915  HGBA1C 5.5   No results found for this basename: CHOL, HDL, LDLCALC, TRIG, CHOLHDL, LDLDIRECT,  in the last 72 hours No results found for this basename: TSH, T4TOTAL, FREET3, T3FREE, THYROIDAB,  in the  last 72 hours No results found for this basename: VITAMINB12, FOLATE, FERRITIN, TIBC, IRON, RETICCTPCT,  in the last 72 hours Coags:  Recent Labs  02/09/13 1619  INR 1.00   Microbiology: No results found for this or any previous visit (from the past 240 hour(s)).   BRIEF HOSPITAL COURSE:   Principal Problem:   Hypoglycemia -Secondary to taking insulin (U500) but then not eating.She is on multiple insulin regimen at home with Lantus and U500, also on Victoza. A1c is 5.5!! -on admission started on D5 ivf, D50 given PRN, upon CBG stabilizing, she was started on a sliding scale regimen with NovoLog, we then resumed her Lantus at 20 units, on discharge it will be increased to 22 units.  - Have had long discussions with the patient and the patient's family, at this time we are allowing some mild permissive hyper glycemia to prevent further life-threatening episodes. Importance of eating and adhering to insulin has been repeatedly counseled. Life-threatening effects of hypoglycemia,symptoms of hypoglycemia d what interventions to do in case have also been repeatedly told the patient and family.  - She will followup with Dr. [name] for further optimization of her insulin regimen   Active Problems: Neurological abnomalities: Slurred/stuttering speech / facial droop -seen by Neuro on admission-thought to be 2/2 Hypoglycemia and functional -Ct head neg - She does have a history of Bell's palsy in the past and has chronic right facial droop. She also has chronic right facial tingling and numbness, she has been scheduled to see a neurologist at [facility] next month. - Since this is a chronic issue, neurology did not feel any further workup was necessary. - she has been asked to see her neurologist- at her next appointment.  HTN  -controlled  -decreased Bumex to daily, stopped Demadex  -c/w Diltiazem and Bystolic  Chronic Back Pain  -resume Sulindac -Start Lidoderm patches - Would not advise  starting narcotics in this situation. - She claims she has chronic back pain from multiple prolapsed discs.  Neuropathy  -stable  -c/w Neurontin - Amitriptyline was not resumed on admission, she has been off amitriptyline for the past few days.  Hypothyroidism  -c/w Levothyroxine  Polypharmacy  -appreciate pharmacy input  -will need to minimize medications  -takes Valium daily-will resume at half dose-to prevent withdrawal seizures - I have asked the patient and family to followup with her regular primary care doctor and other physicians to see if we can slowly start discontinuing some of her medications. Family (mother at bedside) claims that her current mental status is significantly better than his usual baseline, and they have reported episodes to me where she is drowsy and sleeps on the phone when she is talking to them.    IDDM - See above   Migraine  -prn Sumatriptan  -currently with no headache  Fibromyalgia/chronic fatigue syndrome - Unfortunately we need to minimize some of the sedating meds. - Long discussion with patient-her mother  and other family members at bedside, didn't claim that at times she is very drowsy, in fact there have been multiple episodes where she has fallen asleep while on the phone with them. They claimed that her current mental status today is significantly better than what is her baseline. Hopefully, with continue followup with the family M.D.-we can slowly start minimizing some of her medications.  Bronchial asthma  - Continue with nebs   Obstructive sleep apnea  - CPAP   Generalized weakness/chronic fatigue syndrome - Evaluated by physical and occupational therapy services, current recommendations are for home health PT and OT. I have sat down with the patient, her mother, and also spoken with her daughter over the phone. Family able to provide her supervision including medication assistance and assistance with mobility. I did offer the  patient-rehabilitation at a skilled nursing facility. However patient has refused. Family is aware. - In any event, she is significantly better than on admission. Her mentation has significantly improved, she has ambulated with physical therapy for approximately 90 feet. Family do confirm that she chronically uses a walker, and is chronically very unsteady on her feet. I suspect, patient is very close to her usual baseline, if not slightly better.  TODAY-DAY OF DISCHARGE:  Subjective:   Renee Locklear today has no headache,no chest abdominal pain,no new weakness tingling or numbness, feels much better wants to go home today.  Objective:   Blood pressure 122/80, pulse 72, temperature 98.2 F (36.8 C), temperature source Oral, resp. rate 20, height 5' 2"  (1.575 m), weight 99.292 kg (218 lb 14.4 oz), SpO2 90.00%.  Intake/Output Summary (Last 24 hours) at 02/12/13 1202 Last data filed at 02/12/13 0939  Gross per 24 hour  Intake    240 ml  Output      0 ml  Net    240 ml   Filed Weights   02/10/13 0500 02/11/13 0500 02/12/13 0500  Weight: 99.927 kg (220 lb 4.8 oz) 99.519 kg (219 lb 6.4 oz) 99.292 kg (218 lb 14.4 oz)    Exam Awake Alert, Oriented *3, No new F.N deficits, Normal affect. Chronic right facial droop. Generalized weakness but not focal.  [place].AT,PERRAL Supple Neck,No JVD, No cervical lymphadenopathy appriciated.  Symmetrical Chest wall movement, Good air movement bilaterally, CTAB RRR,No Gallops,Rubs or new Murmurs, No Parasternal Heave +ve B.Sounds, Abd Soft, Non tender, No organomegaly appriciated, No rebound -guarding or rigidity. No Cyanosis, Clubbing or edema, No new Rash or bruise  DISCHARGE CONDITION: Stable  DISPOSITION: Home with home health services- PT/OT/RN has been ordered   DISCHARGE INSTRUCTIONS:    Activity:  As tolerated with Full fall precautions use walker/cane & assistance as needed  Diet recommendation: Diabetic Diet Heart Healthy  diet  Discharge Orders   Future Appointments Provider Department Dept Phone   03/01/2013 1:15 PM [facility] [phone]   04/29/2013 9:30 AM Deneise Lever, MD [facility] [phone]   Future Orders Complete By Expires     Call MD for:  persistant dizziness or light-headedness  As directed     Diet - low sodium heart healthy  As directed     Diet Carb Modified  As directed     Increase activity slowly  As directed        Follow-up Information   Follow up with FRIED, ROBERT L, MD. Schedule an appointment as soon as possible for a visit in 1 week.   Contact information:   [place]  [id number] [phone]       Follow up with FRIED, ROBERT L, MD. Schedule an appointment as soon as possible for a visit in 1 week.   Contact information:   [place] [address] [phone]       Follow up with [facility] . (Home Health RN and Physical Therapy)    Contact information:   [phone]     Total Time spent on discharge equals 45 minutes.  SignedOren Binet 02/12/2013 12:02 PM

## 2013-02-16 ENCOUNTER — Other Ambulatory Visit: Payer: Self-pay | Admitting: *Deleted

## 2013-02-16 MED ORDER — GLUCOSE BLOOD VI STRP: ORAL_STRIP | Status: DC

## 2013-03-01 ENCOUNTER — Encounter: Payer: Self-pay | Admitting: Endocrinology

## 2013-03-01 ENCOUNTER — Ambulatory Visit (INDEPENDENT_AMBULATORY_CARE_PROVIDER_SITE_OTHER): Payer: Medicare Other | Admitting: Endocrinology

## 2013-03-01 ENCOUNTER — Other Ambulatory Visit: Payer: Medicare Other

## 2013-03-01 VITALS — BP 110/80 | HR 96 | Temp 98.2°F | Resp 12 | Ht 62.0 in | Wt 223.9 lb

## 2013-03-01 DIAGNOSIS — E039 Hypothyroidism, unspecified: Secondary | ICD-10-CM

## 2013-03-01 DIAGNOSIS — IMO0001 Reserved for inherently not codable concepts without codable children: Secondary | ICD-10-CM

## 2013-03-01 DIAGNOSIS — E119 Type 2 diabetes mellitus without complications: Secondary | ICD-10-CM | POA: Insufficient documentation

## 2013-03-01 DIAGNOSIS — R600 Localized edema: Secondary | ICD-10-CM

## 2013-03-01 DIAGNOSIS — R609 Edema, unspecified: Secondary | ICD-10-CM

## 2013-03-01 NOTE — Progress Notes (Signed)
Patient ID: Cheryl [name], female   DOB: 02-Apr-1966, 47 y.o.   MRN: [id number]  Cheryl [name] is an 47 y.o. female.   Reason for Appointment: Diabetes follow-up   History of Present Illness   Diagnosis: Type 2 DIABETES MELITUS, long-standing     Oral hypoglycemic drugs: None        Side effects from medications: None Insulin regimen: Currently using 22 Lantus at bedtime and U-500 insulin, 10 units only if glucose over 200           Proper timing of medications in relation to meals: Yes.         Monitors blood glucose: Once a day.    Glucometer: One Touch.          Blood Glucose readings from meter download: readings before breakfast:  Hypoglycemia frequency: Never.          Meals: 3 meals per day.          Physical activity: exercise: Unable to do any because of neuropathy and leg pain/weakness             Complications: are: Neuropathy    The last HbgA1c report is:  Lab Results  Component Value Date   HGBA1C 5.5 02/10/2013   She has had difficult to control diabetes with insulin resistance. For several years has had poor control requiring very large doses of insulin and inadequate response to previous regimen of Lantus and NovoLog Overall she has done much better with using U-500 insulin over the last several months and requires less insulin overall A1c generally lower than expected for home readings  She has difficulty with her appetite which is quite variable and because of her meals being irregular and at times unbalanced it is difficult to control her postprandial readings However she usually requires a significant amount basal of insulin to keep her blood sugars controlled  She was recently admitted to the hospital because of episode of hypoglycemia which he thinks was because she took her insulin and did not eat However in the hospital her insulin dose was reduced sharply and she is taking only small amounts as above Her blood sugars have however started to go up and out  frequently over 200, including in the morning Overall her blood sugars are the highest in the afternoons and early evening. This is likely to be from her not taking any U-500 insulin in the morning since she is not eating and also blood sugars may be fairly good at times Highest readings are over 300 in the afternoon and occasionally in the evening also HYPOGLYCEMIA: Minimal with only one reading of 69 last Sunday evening  EDEMA: She is complaining of her left foot and lower leg swelling without any pain in the leg    Medication List       This list is accurate as of: 03/01/13  9:16 AM.  Always use your most recent med list.               albuterol (2.5 MG/3ML) 0.083% nebulizer solution  Commonly known as:  PROVENTIL  Take 2.5 mg by nebulization every 6 (six) hours as needed. For wheezing     albuterol 108 (90 BASE) MCG/ACT inhaler  Commonly known as:  PROVENTIL HFA;VENTOLIN HFA  Inhale 2 puffs into the lungs every 6 (six) hours as needed for wheezing. For wheezing     aspirin 81 MG EC tablet  Take 1 tablet (81 mg total) by mouth daily.  bumetanide 1 MG tablet  Commonly known as:  BUMEX  Take 1 tablet (1 mg total) by mouth daily.     diazepam 5 MG tablet  Commonly known as:  VALIUM  Take 1 tablet (5 mg total) by mouth at bedtime.     diltiazem 240 MG 24 hr capsule  Commonly known as:  CARDIZEM CD  Take 240 mg by mouth daily.     gabapentin 800 MG tablet  Commonly known as:  NEURONTIN  Take 800 mg by mouth 4 (four) times daily.     glucose blood test strip  Commonly known as:  ONE TOUCH ULTRA TEST  Use as directed to check blood glucose, 4 times per day     insulin aspart 100 UNIT/ML injection  Commonly known as:  novoLOG  - 0-15 Units, Subcutaneous, 3 times daily with meals  - CBG < 70:call your Doctor  - CBG 70 - 120: 0 units  - CBG 121 - 150: 2 units  - CBG 151 - 200: 3 units  - CBG 201 - 250: 5 units  - CBG 251 - 300: 8 units  - CBG 301 - 350: 11  units  - CBG 351 - 400: 15 units  - CBG > 400, call your doctor     insulin glargine 100 UNIT/ML injection  Commonly known as:  LANTUS  Inject 0.22 mLs (22 Units total) into the skin at bedtime. For high blood sugar     ipratropium-albuterol 0.5-2.5 (3) MG/3ML Soln  Commonly known as:  DUONEB  Take 3 mLs by nebulization 2 (two) times daily as needed. For wheezing     levothyroxine 112 MCG tablet  Commonly known as:  SYNTHROID, LEVOTHROID  Take 112 mcg by mouth daily.     lidocaine 5 %  Commonly known as:  LIDODERM  Place 1 patch onto the skin daily. Remove & Discard patch within 12 hours or as directed by MD     MONOJECT INS SYR 1CC/30G 30G X 5/16" 1 ML Misc  Generic drug:  Insulin Syringe-Needle U-100  Use as directed to administer insulin     montelukast 10 MG tablet  Commonly known as:  SINGULAIR  Take 1 tablet (10 mg total) by mouth at bedtime.     nebivolol 5 MG tablet  Commonly known as:  BYSTOLIC  Take 5 mg by mouth daily.     Nebulizer Compressor Misc  Use up to 4 times daily as needed     omeprazole 40 MG capsule  Commonly known as:  PRILOSEC  Take 40 mg by mouth 2 (two) times daily.     promethazine 12.5 MG tablet  Commonly known as:  PHENERGAN  Take 12.5 mg by mouth every 6 (six) hours as needed for nausea. For nausea     rosuvastatin 40 MG tablet  Commonly known as:  CRESTOR  Take 40 mg by mouth every evening.     sulindac 200 MG tablet  Commonly known as:  CLINORIL  Take 200 mg by mouth 2 (two) times daily.     SUMAtriptan 100 MG tablet  Commonly known as:  IMITREX  Take 100 mg by mouth as needed. For migraine     tiZANidine 4 MG capsule  Commonly known as:  ZANAFLEX  Take 4 mg by mouth 3 (three) times daily as needed. For muscle spasms     VASCEPA 1 G Caps  Generic drug:  Icosapent Ethyl  Take 2 capsules by mouth 2 (two) times  daily.     Vitamin D (Ergocalciferol) 50000 UNITS Caps capsule  Commonly known as:  DRISDOL  Take 50,000 Units  by mouth every 7 (seven) days. Takes on Wed        Allergies:  Allergies  Allergen Reactions  . Influenza Vaccines     Throat swelling, "flu" symptoms  . Levofloxacin Anaphylaxis and Hives  . Sulfonamide Derivatives Anaphylaxis  . Biaxin (Clarithromycin) Hives  . Nucynta (Tapentadol Hydrochloride) Other (See Comments)    Halucinations insomnia x3 days  . Vilazodone Hcl Other (See Comments)    Hallucinations  . Amoxicillin Hives  . Ciprofloxacin Hives  . Iohexol      Desc: hives,dyspnea; throat swelling; ok w/ premeds and omnipaque   . Pregabalin Other (See Comments)    Reaction-Syncope & unable to speak  . Percocet (Oxycodone-Acetaminophen) Rash    Past Medical History  Diagnosis Date  . GERD (gastroesophageal reflux disease)   . Chronic airway obstruction, not elsewhere classified   . Asthma   . Hyperlipidemia   . Hypothyroidism   . Depression   . Gastroparesis     from DM and chronic narcotic use  . DDD (degenerative disc disease)     CERVIAL AND LUMBAR  . Edema   . Human parvovirus infection   . 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 Fatty liver disease, nonalcoholic   . Positive PPD   . Vitamin D deficiency   . Vocal cord dysfunction   . PONV (postoperative nausea and vomiting)   . Hypertension   . Shortness of breath     "at anytime; it's gotten worse recently" (02/09/2013)  . OSA (obstructive sleep apnea)     "have mask;; don't use it; no one came out to check it" (02/09/2013)  . Type II diabetes mellitus   . Fibromyalgia     "severe" (02/09/2013)  . Peripheral neuropathy     "severe" (02/09/2013)  . Migraine headache     "weekly; worse lately" (02/09/2013)  . Chronic pain     "q where; herniated disc in my tailbone" (02/09/2013)  . Anxiety   . Interstitial cystitis     Past Surgical History  Procedure Laterality Date  . Abdominal hysterectomy  1993  . Cholecystectomy  ?1990  . Nasal sinus surgery   1986; 1990's    "i've had 3" (02/09/2013)  . Knee arthroscopy Left 1990's    "fell; clot behind knee cap had to be removed" (02/09/2013)  . Hip surgery Right 2002    "did something to the tibula band" (02/09/2013)  . Shoulder arthroscopy w/ rotator cuff repair Right     "had to go deep in rotator cuff" (02/09/2013)  . Anterior cervical decomp/discectomy fusion  2004  . Carpal tunnel release Bilateral ?1980's    Family History  Problem Relation Age of Onset  . Coronary artery disease Father     Social History:  reports that she quit smoking about 7 years ago. Her smoking use included Cigarettes. She has a .3 pack-year smoking history. She has never used smokeless tobacco. She reports that she does not drink alcohol or use illicit drugs.  Review of Systems:  HYPERTENSION:  well controlled with Cardizem  HYPERLIPIDEMIA: The lipid abnormality consists of elevated LDL and high triglycerides which are difficult to control.  She has a history of hypothyroidism and last TSH was normal     Examination:   BP 110/80  Pulse 96  Temp(Src)  98.2 F (36.8 C)  Resp 12  Ht 5' 2"  (1.575 m)  Wt 223 lb 14.4 oz (101.56 kg)  BMI 40.94 kg/m2  SpO2 94%  Body mass index is 40.94 kg/(m^2).   Mild puffiness of the left foot and lower leg without any calf tenderness or erythema/warmth Her feet show no lesions or skin changes  ASSESSMENT/ PLAN::   Diabetes type 2   The patient's diabetes control appears to be still poor but she is now requiring very little insulin compared to previous requirements of over 200 units See history of present illness for details on current blood sugar patterns and management  Since her blood sugars are higher in the afternoons even when she does not eat will start her on at least a small amount of U.-500 insulin on a 3 times a day basis She will take additional 4 units when she is eating a meal and also extra when the blood sugar is high Will increase her Lantus only to  25 since she has had some good readings in the mornings and fasting readings will improve with adding evening dose of U-500  LEG edema: She will get elastic stocking since it is is mostly unilateral  Counseling time over 50% of today's 25 minute visit  Emalee Knies 03/01/2013, 9:16 AM

## 2013-03-01 NOTE — Patient Instructions (Signed)
U-500 insulin 4 units, 3x a day FOR MEALS, ADD 4 UNITS SUGARS OVER 200, add 2 units, sugar over 250 add 4 units LANTUS 25 UNITS IN AMS

## 2013-03-02 DIAGNOSIS — E039 Hypothyroidism, unspecified: Secondary | ICD-10-CM | POA: Insufficient documentation

## 2013-03-07 ENCOUNTER — Ambulatory Visit: Payer: Medicare Other | Admitting: Internal Medicine

## 2013-03-22 ENCOUNTER — Telehealth: Payer: Self-pay | Admitting: Endocrinology

## 2013-03-22 MED ORDER — GLUCOSE BLOOD VI STRP: ORAL_STRIP | Status: DC

## 2013-03-29 ENCOUNTER — Ambulatory Visit: Payer: Medicare Other | Admitting: Endocrinology

## 2013-04-04 ENCOUNTER — Telehealth: Payer: Self-pay | Admitting: Endocrinology

## 2013-04-04 NOTE — Telephone Encounter (Signed)
Pharmacy - [facility]. Wants script for Magic Mouthwash or something similar. Has yeast infection in mouth d/t diabetes  Okay to send?

## 2013-04-04 NOTE — Telephone Encounter (Signed)
This should be done by Dr. Maceo Pro

## 2013-04-04 NOTE — Telephone Encounter (Signed)
Pharmacy - [facility]. Wants script for Magic Mouthwash or something similar. Has yeast infection in mouth d/t diabetes / Sherri S.

## 2013-04-19 ENCOUNTER — Ambulatory Visit: Payer: Medicare Other | Admitting: Endocrinology

## 2013-04-29 ENCOUNTER — Ambulatory Visit: Payer: Medicare Other | Admitting: Internal Medicine

## 2013-05-03 ENCOUNTER — Encounter (HOSPITAL_COMMUNITY): Payer: Self-pay | Admitting: Emergency Medicine

## 2013-05-03 ENCOUNTER — Emergency Department (HOSPITAL_COMMUNITY): Payer: Medicare Other

## 2013-05-03 ENCOUNTER — Emergency Department (HOSPITAL_COMMUNITY)
Admission: EM | Admit: 2013-05-03 | Discharge: 2013-05-03 | Disposition: A | Discharge status: Home / Self Care | Payer: Medicare Other | Attending: Emergency Medicine | Admitting: Emergency Medicine

## 2013-05-03 ENCOUNTER — Other Ambulatory Visit: Payer: Self-pay | Admitting: *Deleted

## 2013-05-03 DIAGNOSIS — G9332 Myalgic encephalomyelitis/chronic fatigue syndrome: Secondary | ICD-10-CM | POA: Insufficient documentation

## 2013-05-03 DIAGNOSIS — E119 Type 2 diabetes mellitus without complications: Secondary | ICD-10-CM | POA: Insufficient documentation

## 2013-05-03 DIAGNOSIS — F411 Generalized anxiety disorder: Secondary | ICD-10-CM | POA: Insufficient documentation

## 2013-05-03 DIAGNOSIS — G8929 Other chronic pain: Secondary | ICD-10-CM | POA: Insufficient documentation

## 2013-05-03 DIAGNOSIS — I10 Essential (primary) hypertension: Secondary | ICD-10-CM | POA: Insufficient documentation

## 2013-05-03 DIAGNOSIS — F3289 Other specified depressive episodes: Secondary | ICD-10-CM | POA: Insufficient documentation

## 2013-05-03 DIAGNOSIS — Z87891 Personal history of nicotine dependence: Secondary | ICD-10-CM | POA: Insufficient documentation

## 2013-05-03 DIAGNOSIS — J45909 Unspecified asthma, uncomplicated: Secondary | ICD-10-CM

## 2013-05-03 DIAGNOSIS — E785 Hyperlipidemia, unspecified: Secondary | ICD-10-CM | POA: Insufficient documentation

## 2013-05-03 DIAGNOSIS — Z794 Long term (current) use of insulin: Secondary | ICD-10-CM | POA: Insufficient documentation

## 2013-05-03 DIAGNOSIS — G43909 Migraine, unspecified, not intractable, without status migrainosus: Secondary | ICD-10-CM | POA: Insufficient documentation

## 2013-05-03 DIAGNOSIS — R000 Tachycardia, unspecified: Secondary | ICD-10-CM | POA: Insufficient documentation

## 2013-05-03 DIAGNOSIS — R5382 Chronic fatigue, unspecified: Secondary | ICD-10-CM

## 2013-05-03 DIAGNOSIS — Z8669 Personal history of other diseases of the nervous system and sense organs: Secondary | ICD-10-CM | POA: Insufficient documentation

## 2013-05-03 DIAGNOSIS — IMO0002 Reserved for concepts with insufficient information to code with codable children: Secondary | ICD-10-CM | POA: Insufficient documentation

## 2013-05-03 DIAGNOSIS — Z7982 Long term (current) use of aspirin: Secondary | ICD-10-CM | POA: Insufficient documentation

## 2013-05-03 DIAGNOSIS — Z8619 Personal history of other infectious and parasitic diseases: Secondary | ICD-10-CM | POA: Insufficient documentation

## 2013-05-03 DIAGNOSIS — Z8739 Personal history of other diseases of the musculoskeletal system and connective tissue: Secondary | ICD-10-CM | POA: Insufficient documentation

## 2013-05-03 DIAGNOSIS — Z79899 Other long term (current) drug therapy: Secondary | ICD-10-CM | POA: Insufficient documentation

## 2013-05-03 DIAGNOSIS — J45901 Unspecified asthma with (acute) exacerbation: Secondary | ICD-10-CM | POA: Insufficient documentation

## 2013-05-03 DIAGNOSIS — R110 Nausea: Secondary | ICD-10-CM | POA: Insufficient documentation

## 2013-05-03 DIAGNOSIS — F329 Major depressive disorder, single episode, unspecified: Secondary | ICD-10-CM | POA: Insufficient documentation

## 2013-05-03 DIAGNOSIS — K219 Gastro-esophageal reflux disease without esophagitis: Secondary | ICD-10-CM | POA: Insufficient documentation

## 2013-05-03 DIAGNOSIS — Z8742 Personal history of other diseases of the female genital tract: Secondary | ICD-10-CM | POA: Insufficient documentation

## 2013-05-03 LAB — COMPREHENSIVE METABOLIC PANEL
ALT: 47 U/L — ABNORMAL HIGH (ref 0–35)
AST: 45 U/L — ABNORMAL HIGH (ref 0–37)
Albumin: 3.8 g/dL (ref 3.5–5.2)
Alkaline Phosphatase: 143 U/L — ABNORMAL HIGH (ref 39–117)
BUN: 8 mg/dL (ref 6–23)
CO2: 25 mEq/L (ref 19–32)
Calcium: 9.4 mg/dL (ref 8.4–10.5)
Chloride: 99 mEq/L (ref 96–112)
Creatinine, Ser: 0.61 mg/dL (ref 0.50–1.10)
GFR calc Af Amer: >90 mL/min (ref >90)
GFR calc non Af Amer: >90 mL/min (ref >90)
Glucose, Bld: 154 mg/dL — ABNORMAL HIGH (ref 70–99)
Potassium: 3.5 mEq/L (ref 3.5–5.1)
Sodium: 136 mEq/L (ref 135–145)
Total Bilirubin: 0.5 mg/dL (ref 0.3–1.2)
Total Protein: 7.2 g/dL (ref 6.0–8.3)

## 2013-05-03 LAB — CBC WITH DIFFERENTIAL/PLATELET
Basophils Absolute: 0.1 10*3/uL (ref 0.0–0.1)
Basophils Relative: 0 % (ref 0–1)
Eosinophils Absolute: 0.8 10*3/uL — ABNORMAL HIGH (ref 0.0–0.7)
Eosinophils Relative: 7 % — ABNORMAL HIGH (ref 0–5)
HCT: 36.6 % (ref 36.0–46.0)
Hemoglobin: 12.5 g/dL (ref 12.0–15.0)
Lymphocytes Relative: 35 % (ref 12–46)
Lymphs Abs: 3.9 10*3/uL (ref 0.7–4.0)
MCH: 27.8 pg (ref 26.0–34.0)
MCHC: 34.2 g/dL (ref 30.0–36.0)
MCV: 81.5 fL (ref 78.0–100.0)
Monocytes Absolute: 0.6 10*3/uL (ref 0.1–1.0)
Monocytes Relative: 5 % (ref 3–12)
Neutro Abs: 6 10*3/uL (ref 1.7–7.7)
Neutrophils Relative %: 53 % (ref 43–77)
Platelets: 314 10*3/uL (ref 150–400)
RBC: 4.49 MIL/uL (ref 3.87–5.11)
RDW: 15.2 % (ref 11.5–15.5)
WBC: 11.3 10*3/uL — ABNORMAL HIGH (ref 4.0–10.5)

## 2013-05-03 LAB — GLUCOSE, CAPILLARY: Glucose-Capillary: 145 mg/dL — ABNORMAL HIGH (ref 70–99)

## 2013-05-03 LAB — LIPASE, BLOOD: Lipase: 18 U/L (ref 11–59)

## 2013-05-03 MED ORDER — IPRATROPIUM-ALBUTEROL 0.5-2.5 (3) MG/3ML IN SOLN: 3.0000 mL | RESPIRATORY_TRACT | Status: DC | PRN

## 2013-05-03 MED ORDER — ALBUTEROL SULFATE (5 MG/ML) 0.5% IN NEBU
5.0000 mg | INHALATION_SOLUTION | Freq: Once | RESPIRATORY_TRACT | Status: AC
  Administered 2013-05-03: 5 mg via RESPIRATORY_TRACT
  Filled 2013-05-03: qty 1

## 2013-05-03 MED ORDER — IPRATROPIUM BROMIDE 0.02 % IN SOLN
0.5000 mg | Freq: Once | RESPIRATORY_TRACT | Status: AC
  Administered 2013-05-03: 0.5 mg via RESPIRATORY_TRACT
  Filled 2013-05-03: qty 2.5

## 2013-05-03 MED ORDER — HYDROCODONE-ACETAMINOPHEN 5-325 MG PO TABS: 2.0000 | ORAL_TABLET | ORAL | Status: DC | PRN

## 2013-05-03 MED ORDER — HYDROMORPHONE HCL PF 2 MG/ML IJ SOLN
2.0000 mg | Freq: Once | INTRAMUSCULAR | Status: AC
  Administered 2013-05-03: 2 mg via INTRAMUSCULAR
  Filled 2013-05-03: qty 1

## 2013-05-03 MED ORDER — PREDNISONE 20 MG PO TABS: 40.0000 mg | ORAL_TABLET | Freq: Every day | ORAL | Status: DC

## 2013-05-03 MED ORDER — DIPHENHYDRAMINE HCL 50 MG/ML IJ SOLN
12.5000 mg | Freq: Once | INTRAMUSCULAR | Status: AC
  Administered 2013-05-03: 12.5 mg via INTRAMUSCULAR
  Filled 2013-05-03: qty 1

## 2013-05-03 MED ORDER — ICOSAPENT ETHYL 1 G PO CAPS: 2.0000 | ORAL_CAPSULE | Freq: Two times a day (BID) | ORAL | Status: DC

## 2013-05-03 MED ORDER — HYDROCODONE-ACETAMINOPHEN 5-325 MG PO TABS
2.0000 | ORAL_TABLET | Freq: Once | ORAL | Status: AC
  Administered 2013-05-03: 2 via ORAL
  Filled 2013-05-03: qty 2

## 2013-05-03 MED ORDER — PREDNISONE 20 MG PO TABS
60.0000 mg | ORAL_TABLET | Freq: Once | ORAL | Status: DC
  Filled 2013-05-03: qty 3

## 2013-05-03 NOTE — ED Provider Notes (Signed)
Medical screening examination/treatment/procedure(s) were performed by non-physician practitioner and as supervising physician I was immediately available for consultation/collaboration.   Orpah Greek, MD 05/03/13 [id number]

## 2013-05-03 NOTE — ED Notes (Signed)
Pt refusing Prednisone at this time.  PA made aware, stated that she does not have to take this at this time.  Will give breathing treatment, as RT is busy at this time and reassess pt resp status.

## 2013-05-03 NOTE — ED Notes (Signed)
Ambulated pt in hallway and she maintained at 95% and 119 BPM.  Pt stated that she felt very weak but also stated that her jerky gait was her baseline

## 2013-05-03 NOTE — ED Notes (Signed)
Pt has not been moved to assigned room as of this time.  Pharm Tech, Tech, and Phlebotomy have arrived to access.

## 2013-05-03 NOTE — ED Notes (Addendum)
Pt sent over from [facility] physicians with c/o RUQ abd pain x6 days.  Reports pain is radiating to her back.  Denies injury.  Reports nausea and diarrhea.  No fever.  Pt also c/o cough x4 days.  PCP concerns of possible pneumonia.

## 2013-05-03 NOTE — ED Provider Notes (Signed)
CSN: [id number]     Arrival date & time 05/03/13  1721 History   First MD Initiated Contact with Patient 05/03/13 1721     Chief Complaint  Patient presents with  . Abdominal Pain   (Consider location/radiation/quality/duration/timing/severity/associated sxs/prior Treatment) The history is provided by the patient and medical records. No language interpreter was used.   Cheryl [name] is a 47 y.o. female  with a hx of GERD, hyperlipidemia, asthma, depression, gastroparesis, hypertension, sleep apnea,  Insulin-dependent diabetes, migraine headaches, fibromyalgia, chronic pain, interstitial cystitis presents to the Emergency Department complaining of gradual, persistent, progressively worsening right upper quadrant abdominal pain onset 4 days ago.  . Associated symptoms include shortness of breath and painful breathing.  Nothing makes it better the patient has not attempted home nebulizer and movement and palpation as well as deep breathing makes it worse.  Pt denies fever, chills, headache, neck pain, chest pain, nausea vomiting, diarrhea, weakness, dizziness, syncope, dysuria, hematuria.     Past Medical History  Diagnosis Date  . GERD (gastroesophageal reflux disease)   . Chronic airway obstruction, not elsewhere classified   . Asthma   . Hyperlipidemia   . Hypothyroidism   . Depression   . Gastroparesis     from DM and chronic narcotic use  . DDD (degenerative disc disease)     CERVIAL AND LUMBAR  . Edema   . Human parvovirus infection   . 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 Fatty liver disease, nonalcoholic   . Positive PPD   . Vitamin D deficiency   . Vocal cord dysfunction   . PONV (postoperative nausea and vomiting)   . Hypertension   . Shortness of breath     "at anytime; it's gotten worse recently" (02/09/2013)  . OSA (obstructive sleep apnea)     "have mask;; don't use it; no one came out to check it" (02/09/2013)  .  Type II diabetes mellitus   . Fibromyalgia     "severe" (02/09/2013)  . Peripheral neuropathy     "severe" (02/09/2013)  . Migraine headache     "weekly; worse lately" (02/09/2013)  . Chronic pain     "q where; herniated disc in my tailbone" (02/09/2013)  . Anxiety   . Interstitial cystitis    Past Surgical History  Procedure Laterality Date  . Abdominal hysterectomy  1993  . Cholecystectomy  ?1990  . Nasal sinus surgery  1986; 1990's    "i've had 3" (02/09/2013)  . Knee arthroscopy Left 1990's    "fell; clot behind knee cap had to be removed" (02/09/2013)  . Hip surgery Right 2002    "did something to the tibula band" (02/09/2013)  . Shoulder arthroscopy w/ rotator cuff repair Right     "had to go deep in rotator cuff" (02/09/2013)  . Anterior cervical decomp/discectomy fusion  2004  . Carpal tunnel release Bilateral ?1980's   Family History  Problem Relation Age of Onset  . Coronary artery disease Father    History  Substance Use Topics  . Smoking status: Former Smoker -- 0.10 packs/day for 3 years    Types: Cigarettes    Quit date: 07/28/2005  . Smokeless tobacco: Never Used  . Alcohol Use: No   OB History   Grav Para Term Preterm Abortions TAB SAB Ect Mult Living                 Review of Systems  Constitutional: Negative  for fever, diaphoresis, appetite change, fatigue and unexpected weight change.  HENT: Negative for mouth sores, trouble swallowing, neck pain and neck stiffness.   Respiratory: Positive for cough, chest tightness, shortness of breath and wheezing. Negative for stridor.   Cardiovascular: Negative for chest pain and palpitations.  Gastrointestinal: Positive for nausea and abdominal pain. Negative for vomiting, diarrhea, constipation, blood in stool, abdominal distention and rectal pain.  Genitourinary: Negative for dysuria, urgency, frequency, hematuria, flank pain and difficulty urinating.  Musculoskeletal: Negative for back pain.  Skin: Negative for  rash.  Neurological: Negative for weakness.  Hematological: Negative for adenopathy.  Psychiatric/Behavioral: Negative for confusion.  All other systems reviewed and are negative.    Allergies  Influenza vaccines; Levofloxacin; Sulfonamide derivatives; Biaxin; Nucynta; Vilazodone hcl; Amoxicillin; Ciprofloxacin; Iohexol; Pregabalin; and Percocet  Home Medications   Current Outpatient Rx  Name  Route  Sig  Dispense  Refill  . albuterol (PROVENTIL HFA;VENTOLIN HFA) 108 (90 BASE) MCG/ACT inhaler   Inhalation   Inhale 2 puffs into the lungs every 6 (six) hours as needed for wheezing. For wheezing         . albuterol (PROVENTIL) (2.5 MG/3ML) 0.083% nebulizer solution   Nebulization   Take 2.5 mg by nebulization every 6 (six) hours as needed. For wheezing         . aspirin EC 81 MG tablet   Oral   Take 81 mg by mouth daily.         [name] aspirin-acetaminophen-caffeine (EXCEDRIN MIGRAINE) 250-250-65 MG per tablet   Oral   Take 1 tablet by mouth every 6 (six) hours as needed for pain.         . bumetanide (BUMEX) 1 MG tablet   Oral   Take 1 mg by mouth daily.         . diazepam (VALIUM) 5 MG tablet   Oral   Take 5 mg by mouth at bedtime as needed for sleep.         [name] diltiazem (CARDIZEM CD) 240 MG 24 hr capsule   Oral   Take 240 mg by mouth daily.           [name] gabapentin (NEURONTIN) 800 MG tablet   Oral   Take 800 mg by mouth 4 (four) times daily.          Vanessa Kick Ethyl (VASCEPA) 1 G CAPS   Oral   Take 2 capsules by mouth 2 (two) times daily.         . insulin glargine (LANTUS) 100 UNIT/ML injection   Subcutaneous   Inject 25 Units into the skin every morning.         . insulin regular human CONCENTRATED (HUMULIN R) 500 UNIT/ML SOLN injection   Subcutaneous   Inject 10-14 Units into the skin 3 (three) times daily as needed. Pt takes if BS>200. Take addition 4 units when eating a meal.         . levothyroxine (SYNTHROID, LEVOTHROID) 112 MCG  tablet   Oral   Take 112 mcg by mouth daily.           . montelukast (SINGULAIR) 10 MG tablet   Oral   Take 10 mg by mouth at bedtime.         . naproxen sodium (ANAPROX) 220 MG tablet   Oral   Take 220 mg by mouth 2 (two) times daily with a meal.         . nebivolol (BYSTOLIC) 5 MG tablet  Oral   Take 5 mg by mouth daily.         [name] omeprazole (PRILOSEC) 40 MG capsule   Oral   Take 40 mg by mouth 2 (two) times daily.          . rosuvastatin (CRESTOR) 40 MG tablet   Oral   Take 40 mg by mouth every evening.          . sulindac (CLINORIL) 200 MG tablet   Oral   Take 200 mg by mouth 2 (two) times daily.         [name] tiZANidine (ZANAFLEX) 4 MG capsule   Oral   Take 4 mg by mouth 3 (three) times daily as needed. For muscle spasms         . Vitamin D, Ergocalciferol, (DRISDOL) 50000 UNITS CAPS   Oral   Take 50,000 Units by mouth every 7 (seven) days. Takes on Wed         . glucose blood (ONE TOUCH ULTRA TEST) test strip      Use as directed to check blood glucose, 4 times per day dx code 250.02   150 each   5   . HYDROcodone-acetaminophen (NORCO/VICODIN) 5-325 MG per tablet   Oral   Take 2 tablets by mouth every 4 (four) hours as needed for pain.   6 tablet   0   . ipratropium-albuterol (DUONEB) 0.5-2.5 (3) MG/3ML SOLN   Nebulization   Take 3 mLs by nebulization every 2 (two) hours as needed.   30 mL   1   . MONOJECT INS SYR 1CC/30G 30G X 5/16" 1 ML MISC      Use as directed to administer insulin         . predniSONE (DELTASONE) 20 MG tablet   Oral   Take 2 tablets (40 mg total) by mouth daily.   10 tablet   0    BP 142/88  Pulse 106  Temp(Src) 98.2 F (36.8 C) (Oral)  Resp 24  SpO2 92% Physical Exam  Nursing note and vitals reviewed. Constitutional: She is oriented to person, place, and time. She appears well-developed and well-nourished. No distress.  Awake, alert, nontoxic appearance  HENT:  Head: Normocephalic and atraumatic.   Right Ear: Hearing, tympanic membrane, external ear and ear canal normal.  Left Ear: Hearing, tympanic membrane, external ear and ear canal normal.  Nose: Nose normal. No mucosal edema or rhinorrhea.  Mouth/Throat: Uvula is midline, oropharynx is clear and moist and mucous membranes are normal. Mucous membranes are not dry. No edematous. No oropharyngeal exudate, posterior oropharyngeal edema, posterior oropharyngeal erythema or tonsillar abscesses.  Eyes: Conjunctivae are normal. Pupils are equal, round, and reactive to light. No scleral icterus.  Neck: Normal range of motion. Neck supple.  Cardiovascular: Regular rhythm, normal heart sounds and intact distal pulses.   tachycardia  Pulmonary/Chest: Effort normal and breath sounds normal. No respiratory distress. She has no wheezes. She exhibits tenderness.    Abdominal: Soft. Bowel sounds are normal. She exhibits no distension and no mass. There is tenderness in the right upper quadrant. There is guarding. There is no rebound.    Musculoskeletal: Normal range of motion. She exhibits no edema and no tenderness.  Lymphadenopathy:    She has no cervical adenopathy.  Neurological: She is alert and oriented to person, place, and time. She exhibits normal muscle tone. Coordination normal.  Speech is clear and goal oriented Moves extremities without ataxia  Skin: Skin is warm and  dry. No rash noted. She is not diaphoretic. No erythema.  Psychiatric: She has a normal mood and affect. Her behavior is normal.    ED Course  Procedures (including critical care time) Labs Review Labs Reviewed  CBC WITH DIFFERENTIAL - Abnormal; Notable for the following:    WBC 11.3 (*)    Eosinophils Relative 7 (*)    Eosinophils Absolute 0.8 (*)    All other components within normal limits  COMPREHENSIVE METABOLIC PANEL - Abnormal; Notable for the following:    Glucose, Bld 154 (*)    AST 45 (*)    ALT 47 (*)    Alkaline Phosphatase 143 (*)    All other  components within normal limits  GLUCOSE, CAPILLARY - Abnormal; Notable for the following:    Glucose-Capillary 145 (*)    All other components within normal limits  LIPASE, BLOOD   Imaging Review Dg Chest 2 View  05/03/2013   CLINICAL DATA:  Right-sided chest pain radiating posteriorly.  EXAM: CHEST  2 VIEW  COMPARISON:  02/09/2013  FINDINGS: The lungs are adequately inflated the without focal consolidation or effusion. Cardiomediastinal silhouette and remainder of the exam is unchanged.  IMPRESSION: No active cardiopulmonary disease.   Electronically Signed   By: Marin Olp M.D.   On: 05/03/2013 18:39   US Abdomen Complete  05/03/2013   *RADIOLOGY REPORT*  Clinical Data:  Right upper quadrant abdominal pain radiating to the back, history of cholecystectomy, diabetes and hypertension, initial encounter.  COMPLETE ABDOMINAL ULTRASOUND  Comparison:  CT abdomen pelvis - 02/27/2010  Findings:  Gallbladder:  Surgically absent  Common bile duct:  Borderline enlarged measuring 7.2 mm in diameter, presumably the sequela of postcholecystectomy state.  Liver:  There is mild diffuse increase echogenicity of the hepatic parenchyma.  No discrete hepatic lesions.  No definite intrahepatic biliary duct dilatation.  No ascites.  IVC:  Appears normal.  Pancreas:  Limited visualization of the pancreatic head and neck is normal.  Visualization of the pancreatic body and tail is obscured by bowel gas.  Spleen:  Borderline enlarged measuring 13.8 cm in length with estimated volume of 570 ml.  Right Kidney:  Normal cortical thickness, echogenicity and size, measuring 14.8 cm in length.  No focal renal lesions.  No echogenic renal stones.  No urinary obstruction.  Left Kidney:  Normal cortical thickness, echogenicity and size, measuring 12.8 cm in length.  No focal renal lesions.  No echogenic renal stones.  No urinary obstruction.  Abdominal aorta:  No aneurysm identified.  IMPRESSION: 1.  Findings suggestive of hepatic  steatosis. 2.  Post cholecystectomy. 3.  Borderline nonspecific splenomegaly, similar to remote prior abdominal CT.   Original Report Authenticated By: Jake Seats, MD    MDM   1. Asthma with bronchitis   2. Chronic fatigue syndrome   3. Asthma exacerbation      Renee B Locklear presents with RUQ abd pain worsened with movement and palpation.  Patient with decreased oxygen saturations approximately 90% on room air and shallow breathing.  Will obtain labs, chest x-ray and give nebulizer.  8:00PM Patient with some improvement after nebulizer.  Will repeat nebulizer and ambulate in the hall without oxygen.  Patient given Vicodin for further pain control.  10:51 PM Patient ambulated in ED with O2 saturations maintained >90, no current signs of respiratory distress. Lung exam improved after nebulizer treatment. Pt refused prednisone given in the ED, but pt will bd dc with 5 day burst and encouraged to  take it. Pt states they are breathing close to baseline. Pt has been instructed to continue using prescribed medications and to speak with PCP about today's exacerbation.   It has been determined that no acute conditions requiring further emergency intervention are present at this time. The patient/guardian have been advised of the diagnosis and plan. We have discussed signs and symptoms that warrant return to the ED, such as changes or worsening in symptoms.   Vital signs are stable at discharge. Patient with history of tachycardia at baseline and patient just finished second nebulizer.  BP 142/88  Pulse 106  Temp(Src) 98.2 F (36.8 C) (Oral)  Resp 24  SpO2 92%  Patient/guardian has voiced understanding and agreed to follow-up with the PCP or specialist.       Abigail Butts, PA-C 05/03/13 2255

## 2013-05-03 NOTE — ED Notes (Signed)
RT paged for neb treatment at this time

## 2013-05-04 ENCOUNTER — Encounter: Payer: Self-pay | Admitting: Adult Health

## 2013-05-04 ENCOUNTER — Ambulatory Visit (INDEPENDENT_AMBULATORY_CARE_PROVIDER_SITE_OTHER): Payer: Medicare Other | Admitting: Adult Health

## 2013-05-04 ENCOUNTER — Telehealth: Payer: Self-pay | Admitting: Internal Medicine

## 2013-05-04 VITALS — BP 122/80 | HR 98 | Ht 62.0 in | Wt 205.0 lb

## 2013-05-04 DIAGNOSIS — J45909 Unspecified asthma, uncomplicated: Secondary | ICD-10-CM

## 2013-05-04 MED ORDER — HYDROCODONE-HOMATROPINE 5-1.5 MG/5ML PO SYRP: 5.0000 mL | ORAL_SOLUTION | Freq: Four times a day (QID) | ORAL | Status: DC | PRN

## 2013-05-04 MED ORDER — MOMETASONE FURO-FORMOTEROL FUM 100-5 MCG/ACT IN AERO: 2.0000 | INHALATION_SPRAY | Freq: Two times a day (BID) | RESPIRATORY_TRACT | Status: DC

## 2013-05-04 NOTE — Telephone Encounter (Signed)
I spoke with pt. She went to ED last night. She stated her O2 sats were low. ED has it documented she was walked and sats maintained at 95%.  I spoke katie and Dr. [name]. See if pt can come in today and see TP if she is requesting appt.  I called and spoke with pt and she is scheduled to come in at 11:45. Nothing further needed

## 2013-05-04 NOTE — Patient Instructions (Addendum)
Mucinex DM Twice daily  As needed  Cough/congestion  Saline nasal rinses As needed   Hydromet 1-2 tsp every 4-6 hr As needed  Cough/congestion . -may make you sleepy.(do not take with hydrocodone)  Warm heat to ribs As needed   Begin Dulera 100/14mg 2 puffs Twice daily -brush/rinse/gargle after use.  Please contact office for sooner follow up if symptoms do not improve or worsen or seek emergency care  follow up Dr. YAnnamaria Boots In 2-3  weeks and As needed

## 2013-05-05 NOTE — Assessment & Plan Note (Signed)
Mild flare with URI and pleuritic pain  ER w/up unrevealing with neg labs, cxr and abd [name]   Plan  Mucinex DM Twice daily  As needed  Cough/congestion  Saline nasal rinses As needed   Hydromet 1-2 tsp every 4-6 hr As needed  Cough/congestion . -may make you sleepy.(do not take with hydrocodone)  Warm heat to ribs As needed   Begin Dulera 100/15mg 2 puffs Twice daily -brush/rinse/gargle after use.  Please contact office for sooner follow up if symptoms do not improve or worsen or seek emergency care  follow up Dr. YAnnamaria Boots In 2-3  weeks and As needed

## 2013-05-05 NOTE — Progress Notes (Signed)
Subjective:    Patient ID: Cheryl [name], female    DOB: 02-27-1966, 47 y.o.   MRN: [id number]  Asthma Her past medical history is significant for asthma.   June 24, 2010- Allergic rhinitis, OSA, DM, GERD..........................[name] here  Nurse-CC: 4 month follow up visit-wheezing, few asthma attacks, sneezing(using nasal sprays). Sleep- waking up "alot" and other days sleeping all day and not able to wake up well enough.  Now on cefdinir for sinusitis. Sneezing all Fall. Had CT sinus at [facility]- "chronic sinusitis".  Mother reports fall 2 weeks ago- fell onto knees and hands when knees buckled. [name] now when she stands. Evaluated at [facility]. Anticipate referral to [facility] neurology. Had flu vax. Still lives same house.  Continues CPAP. When too tight she got pressure marks- discussed. Not smoking. Getting divorced- looking at options.   August 28, 2010-Allergic rhinitis, Asthma, OSA, DM, GERD.............[name] here  Nurse-CC: Follow up per [name], FNP. Pt c/o chest pressure and tightness, pains in chest when breathing, cough with very little yellow mucus, increased SOB at rest and with activity x 1 wk.  CPAP 10- Usually using it all night every night. Has had to skip this week while ill.  Says she has been on abx since October for sinusitis- did CT sinus. Gets better then worse. In last 1-2 weeks, increased cough and wheeze , tussive soreness mid anterior and left lateral chest. CBC and CXR at [name]. Says 2 days ago WBC 14000, given nebs. Now on doxy 234m daily.  [facility] [place] ? neuropathy and falling- uses walker.   11/11/10- Acute OV-Walk in  Complains of increased SOB, wheezing, dry cough, chest congestion, low grade temp x2days. Has a lot of sinus drainage, cough, post nasal drip and wheezing. Wears CPAP at night, no interference. Teeth hurt at  at times. Has fatigue and no energy.  Has been taking zyrtec without much help. Very dry in sinuses. Frontal headache.    12/27/10- Allergic rhinitis, Asthma, OSA, complicated by DM, GERD  Mother here After seeing TP in April got welll after 2 rounds of omnicef. Tongue feels funny. Diabetic control has not been good.  Breathing has been good.   07/08/11- 89YOF former smoker, followed for asthma, allergic rhinitis, OSA/ failed CPAP, complicated by DM, GERD.   Mother here and offers her observations.  Says she felt okay until yesterday. She couldn't walk well, was falling and had gone back to using her walker. They called 911 and they told her she was okay except glucose was high. Mother wonders about "inner ear"/vertigo. Treated with eardrops in October for otitis left ear. Has Bell's palsy off and on causing some weakness in the left lower face which other associates with episodes of viral illness such as bronchitis, or allergy. [name], [name], with a total of 3 rounds of antibiotics this fall.  She now says that cough began about the time she began ramipril from Dr KDwyane Dee    04/29/12- 89YOF former smoker, followed for asthma, allergic rhinitis, OSA/ failed CPAP, complicated by DM, GERD.  Daughter here.  Has had flu shot. Cough, wheezing x 4 days; states friend of hers passed last Friday with MRSA.  Recent cold with increased wheeze, cough, sweating, hoarseness. Reports temperature 101. Scant sputum was green and nasal discharge green  but no headache. 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  09/06/12- 46 YOF former smoker, followed for asthma, allergic rhinitis, OSA/ failed CPAP, complicated by DM, GERD.  Mother here.  Has had flu shot. FOLLOWS WLN:[id number] deep cough and wheezing; also states that APS came picked up CPAP machine due to Medicare and  stating patient not using it for 90 days. Daily wheeze using rescue inhaler or nebulizer to control.no major distress. We discussed ways to provide more stability. Diabetes and persistent thrush interfere with steroid choices. CXR 05/06/12 IMPRESSION:  No evidence of acute cardiopulmonary disease.  Original Report Authenticated By: [name], M.D.   12/01/2012 Acute OV  Complains of wheezing, chest tightness, dry cough, increased SOB - reports no improvement since last ov, worse this AM for 2 weeks .  Has been doing nebs 4x daily. No hemoptysis, n/v, orthopnea, edema, or chest pain.  No otc used. Used Tussionex with some help.  05/04/13  ER follow up  ER follow up , cough, wheezing for weeks with right sided pleuritic pain.  Patient went to the emergency room on October 7 for shortness, of breath and right-sided chest, and abdominal pain. Workup was unrevealing, with a negative. Chest x-ray, abdominal ultrasound and lab work Showed only minimally, elevated LFTs. Patient reports that she continues to have some pain. On her right rib area, especially, if she coughs. Patient describes a dry cough, and intermittent wheezing. She denies any discolored mucus or fever. Orthopnea, PND, leg swelling, or calf pain. Patient says she is currently being evaluated by neurology for her weakness, and muscle spasms.   Says cough is keeping her up at night .   Review of Systems- see HPI Constitutional:   No-   weight loss, night sweats, fevers, chills,+irregular sleep schedule, fatigue, lassitude. HEENT:   No-headaches, difficulty swallowing, tooth/dental problems, +sore throat,       No-  sneezing, itching, +ear ache, nasal congestion, post nasal drip,  CV:  No- chest pain, orthopnea, PND, swelling in lower extremities, anasarca,  dizziness, palpitations Resp: No- acute  shortness of breath with exertion or at rest.              No- productive cough,  + non-productive cough,  No- coughing up of blood.               No- change in color of mucus.   Skin: No-   rash or lesions. GI:  No-   heartburn, indigestion, abdominal pain, nausea, vomiting,  GU:  MS:  No-   joint pain or swelling.   Neuro-     nothing unusual Psych:  No- change in mood or affect. No depression or anxiety.  No memory loss.  Objective:   Physical Exam  GEN: A/Ox3; pleasant , NAD obese   HEENT:  [place]/AT,  EACs-clear, TMs-wnl, NOSE-clear, THROAT-clear, no lesions, no postnasal drip or exudate noted.   NECK:  Supple w/ fair ROM; no JVD; normal carotid impulses w/o bruits; no thyromegaly or nodules palpated; no lymphadenopathy.  RESP  Clear  P & A; w/o, wheezes/ rales/ or rhonchi.no accessory muscle use, no dullness to percussion  CARD:  RRR, no m/r/g  ,no  peripheral edema, pulses intact, no cyanosis or clubbing.  GI:   Soft & nt; nml bowel sounds; no organomegaly or masses detected.  Musco: Warm bil, no deformities or joint swelling noted.   Neuro: alert, no focal deficits noted,  flat affect, shaky   Skin: Warm, no lesions or rashes

## 2013-05-31 ENCOUNTER — Ambulatory Visit (INDEPENDENT_AMBULATORY_CARE_PROVIDER_SITE_OTHER): Payer: Medicare Other | Admitting: Internal Medicine

## 2013-05-31 ENCOUNTER — Encounter: Payer: Self-pay | Admitting: Internal Medicine

## 2013-05-31 VITALS — BP 108/60 | HR 85 | Ht 62.0 in | Wt 222.4 lb

## 2013-05-31 DIAGNOSIS — G4710 Hypersomnia, unspecified: Secondary | ICD-10-CM

## 2013-05-31 DIAGNOSIS — R5382 Chronic fatigue, unspecified: Secondary | ICD-10-CM

## 2013-05-31 DIAGNOSIS — B3781 Candidal esophagitis: Secondary | ICD-10-CM

## 2013-05-31 DIAGNOSIS — G9332 Myalgic encephalomyelitis/chronic fatigue syndrome: Secondary | ICD-10-CM

## 2013-05-31 DIAGNOSIS — B370 Candidal stomatitis: Secondary | ICD-10-CM

## 2013-05-31 DIAGNOSIS — G4733 Obstructive sleep apnea (adult) (pediatric): Secondary | ICD-10-CM

## 2013-05-31 DIAGNOSIS — J45909 Unspecified asthma, uncomplicated: Secondary | ICD-10-CM

## 2013-05-31 NOTE — Progress Notes (Signed)
Subjective:    Patient ID: Cheryl [name], female    DOB: 01-15-1966, 47 y.o.   MRN: [id number]  Asthma Her past medical history is significant for asthma.   June 24, 2010- Allergic rhinitis, OSA, DM, GERD..........................[name] here  Nurse-CC: 4 month follow up visit-wheezing, few asthma attacks, sneezing(using nasal sprays). Sleep- waking up "alot" and other days sleeping all day and not able to wake up well enough.  Now on cefdinir for sinusitis. Sneezing all Fall. Had CT sinus at [facility]- "chronic sinusitis".  Mother reports fall 2 weeks ago- fell onto knees and hands when knees buckled. [facility]yl [name] now when she stands. Evaluated at [name]. Anticipate referral to [name] neurology. Had flu vax. Still lives same house.  Continues CPAP. When too tight she got pressure marks- discussed. Not smoking. Getting divorced- looking at options.   August 28, 2010-Allergic rhinitis, Asthma, OSA, DM, GERD.............[name] here  Nurse-CC: Follow up per Cheryl Hoehn, FNP. Pt c/o chest pressure and tightness, pains in chest when breathing, cough with very little yellow mucus, increased SOB at rest and with activity x 1 wk.  CPAP 10- Usually using it all night every night. Has had to skip this week while ill.  Says she has been on abx since October for sinusitis- did CT sinus. Gets better then worse. In last 1-2 weeks, increased cough and wheeze , tussive soreness mid anterior and left lateral chest. CBC and CXR at [name]. Says 2 days ago WBC 14000, given nebs. Now on doxy 238m daily.  [facility].   11/11/10- Acute OV-Walk in  Complains of increased SOB, wheezing, dry cough, chest congestion, low grade temp x2days. Has a lot of sinus drainage, cough, post nasal drip and wheezing. Wears CPAP at night, no interference. Teeth hurt at  at times. Has fatigue and no energy.  Has been taking zyrtec without much help. Very dry in sinuses. Frontal headache.    12/27/10- Allergic rhinitis, Asthma, OSA, complicated by DM, GERD  Mother here After seeing Cheryl [name] in April got welll after 2 rounds of omnicef. Tongue feels funny. Diabetic control has not been good.  Breathing has been good.   07/08/11- 47YOF former smoker, followed for asthma, allergic rhinitis, OSA/ failed CPAP, complicated by DM, GERD.   Mother here and offers her observations.  Says she felt okay until yesterday. She couldn't walk well, was falling and had gone back to using her walker. They called 911 and they told her she was okay except glucose was high. Mother wonders about "inner ear"/vertigo. Treated with eardrops in October for otitis left ear. Has Bell's palsy off and on causing some weakness in the left lower face which other associates with episodes of viral illness such as bronchitis, or allergy. Cheryl [name] she hasn't felt right since October-band like head tightness dizziness, [name], with a total of 3 rounds of antibiotics this fall.  She now says that cough began about the time she began ramipril from [name]    04/29/12- 47YOF former smoker, followed for asthma, allergic rhinitis, OSA/ failed CPAP, complicated by DM, GERD.  Daughter here.  Has had flu shot. Cough, wheezing x 4 days; states friend of hers passed last Friday with MRSA.  Recent cold with increased wheeze, cough, sweating, hoarseness. Reports temperature 101. Scant sputum was green and nasal discharge green  but no headache. 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  09/06/12- 47 YOF former smoker, followed for asthma, allergic rhinitis, OSA/ failed CPAP, complicated by DM, GERD.  Mother here.  Has had flu shot. FOLLOWS Cheryl [name]:IRJJOA deep cough and wheezing; also states that [facility]yl [name] came picked up CPAP machine due to Medicare and  stating patient not using it for 90 days. Daily wheeze using rescue inhaler or nebulizer to control.no major distress. We discussed ways to provide more stability. Diabetes and persistent thrush interfere with steroid choices. CXR 05/06/12 IMPRESSION:  No evidence of acute cardiopulmonary disease.  Original Report Authenticated By: Cheryl [name], M.Cheryl.  12/01/2012 Acute OV  Complains of wheezing, chest tightness, dry cough, increased SOB - reports no improvement since last ov, worse this AM for 2 weeks .  Has been doing nebs 4x daily. No hemoptysis, n/v, orthopnea, edema, or chest pain.  No otc used. Used Tussionex with some help.  05/04/13  ER follow up  ER follow up , cough, wheezing for weeks with right sided pleuritic pain.  Patient went to the emergency room on October 7 for shortness, of breath and right-sided chest, and abdominal pain. Workup was unrevealing, with a negative. Chest x-ray, abdominal ultrasound and lab work Showed only minimally, elevated LFTs. Patient reports that she continues to have some pain. On her right rib area, especially, if she coughs. Patient describes a dry cough, and intermittent wheezing. She denies any discolored mucus or fever. Orthopnea, PND, leg swelling, or calf pain. Patient says she is currently being evaluated by neurology for her weakness, and muscle spasms.   Says cough is keeping her up at night .   05/31/13- 47 yoF47 YOF former smoker, followed for asthma, allergic rhinitis, OSA/ failed CPAP, complicated by DM, GERD.  Female friend here.   FOLLOWS FOR: has spasms in throat-getting choked up when eating or taking medications. Would like to try to get CPAP back-told that she is stopping breathing again. Had CPAP 11/ [name]- taken away because of noncompliance Complains of "muscle spasms" in throat-chokes easily. This is episodic. She is seeing a neurologist at [name] and is on Zanaflex. We discussed reflux precautions. Friend says she snores and stops  breathing. Sleeping in recliner because she says "hurt all over".  Review of Systems- see HPI Constitutional:   No-   weight loss, night sweats, fevers, chills,+irregular sleep schedule, fatigue, lassitude. HEENT:   No-headaches, difficulty swallowing, tooth/dental problems, +sore throat,       No-  sneezing, itching, +ear ache, nasal congestion, post nasal drip,  CV:  No- chest pain, orthopnea, PND, swelling in lower extremities, anasarca,  dizziness, palpitations Resp: No- acute  shortness of breath with exertion or at rest.              No- productive cough,  + non-productive cough,  No- coughing up of blood.              No- change in color of mucus.   Skin: No-   rash or lesions. GI:  No-   heartburn, indigestion, abdominal pain, nausea, vomiting,  GU:  MS:  No-   joint pain or swelling.   Neuro-    +per history of present illness Psych:  No- change in mood or affect. No depression or anxiety.  No memory loss.  Objective:   Physical Exam OBJ- Physical Exam General- Alert, Oriented, Affect-appropriate, Distress- none acute. overweight Skin- rash-none, lesions-  none, excoriation- none Lymphadenopathy- none Head- atraumatic            Eyes- Gross vision intact, PERRLA, conjunctivae and secretions clear            Ears- Hearing, canals-normal            Nose- Clear, no-Septal dev, mucus, polyps, erosion, perforation             Throat- Mallampati II , mucosa +thrush-mild , drainage- none, tonsils- atrophic, not hoarse Neck- flexible , trachea midline, no stridor , thyroid nl, carotid no bruit Chest - symmetrical excursion , unlabored           Heart/CV- RRR , no murmur , no gallop  , no rub, nl s1 s2                           - JVD- none , edema- none, stasis changes- none, varices- none           Lung- clear to P&A, wheeze- none, cough- none , dullness-none, rub- none           Chest wall-  Abd-  Br/ Gen/ Rectal- Not done, not indicated Extrem- cyanosis- none, clubbing, none,  atrophy- none, strength- nl Neuro- +hesitant speech

## 2013-05-31 NOTE — Patient Instructions (Signed)
Order- [facility]- Pt wants to re-try CPAP  Order- new CPAP auto 5-15 x 7 days for pressure recommendation  Dx OSA

## 2013-06-02 ENCOUNTER — Telehealth: Payer: Self-pay | Admitting: Internal Medicine

## 2013-06-02 ENCOUNTER — Other Ambulatory Visit: Payer: Self-pay

## 2013-06-02 DIAGNOSIS — G4733 Obstructive sleep apnea (adult) (pediatric): Secondary | ICD-10-CM

## 2013-06-02 NOTE — Telephone Encounter (Signed)
Per [facility]-lets order a split night sleep study DX OSA

## 2013-06-02 NOTE — Telephone Encounter (Signed)
Per [facility]-- Received order to restart CPAP. Order placed 05/31/13-- Order- [facility]- Pt wants to re-try CPAP Order- new CPAP auto 5-15 x 7 days for pressure recommendation Dx OSA  Per [facility] Medicare, since patient was non-compliant with previous CPAP trial, pt needing new sleep study.  Please advise Dr [name]. Thanks.

## 2013-06-02 NOTE — Telephone Encounter (Signed)
Order has been placed. Will send staff message to [facility] making her aware of plan

## 2013-06-08 ENCOUNTER — Ambulatory Visit: Payer: Medicare Other | Admitting: Endocrinology

## 2013-06-09 NOTE — Telephone Encounter (Signed)
Phone note completed ° °

## 2013-06-14 ENCOUNTER — Telehealth: Payer: Self-pay | Admitting: Gastroenterology

## 2013-06-14 NOTE — Telephone Encounter (Signed)
Dr Ardis Hughs is this ok?

## 2013-06-14 NOTE — Telephone Encounter (Signed)
It is ok with me if it is ok with [name] Plan

## 2013-06-14 NOTE — Telephone Encounter (Signed)
Is it ok with you Dr Fuller Plan?

## 2013-06-15 NOTE — Telephone Encounter (Signed)
Patient scheduled for 07/11/13 9:15

## 2013-06-15 NOTE — Telephone Encounter (Signed)
Sheri let me know if I need to do anything.[name]

## 2013-06-15 NOTE — Telephone Encounter (Signed)
OK to transfer to me. Please let the patient know that I reviewed her chart and I do not feel I have anything more to offer her than Dr. Ardis Hughs.

## 2013-06-16 NOTE — Assessment & Plan Note (Signed)
Already on treatment with Magic mouthwash

## 2013-06-16 NOTE — Assessment & Plan Note (Signed)
She suggests she has been diagnosed with fibromyalgia. I don't know if his problems relate to her dysphonia and laryngospasm. A functional problem is not excluded.

## 2013-06-16 NOTE — Assessment & Plan Note (Signed)
Medications seem to help but she has not had recent exacerbation of asthma

## 2013-06-16 NOTE — Assessment & Plan Note (Signed)
Is complicated to work out her issues. Her insight is not good. It does sound as if she has symptomatic sleep apnea and she is motivated to try CPAP again, with input from her friend. She would like to change DME company to [employer]. Plan-located change DME company and reorder CPAP 11

## 2013-06-30 ENCOUNTER — Other Ambulatory Visit: Payer: Self-pay | Admitting: Internal Medicine

## 2013-06-30 ENCOUNTER — Other Ambulatory Visit: Payer: Medicare Other

## 2013-06-30 ENCOUNTER — Ambulatory Visit: Payer: Medicare Other | Admitting: Endocrinology

## 2013-07-01 ENCOUNTER — Other Ambulatory Visit: Payer: Self-pay | Admitting: *Deleted

## 2013-07-01 MED ORDER — LEVOTHYROXINE SODIUM 112 MCG PO TABS: 112.0000 ug | ORAL_TABLET | Freq: Every day | ORAL | Status: DC

## 2013-07-04 ENCOUNTER — Other Ambulatory Visit (HOSPITAL_COMMUNITY): Payer: Self-pay | Admitting: Internal Medicine

## 2013-07-06 ENCOUNTER — Ambulatory Visit (HOSPITAL_BASED_OUTPATIENT_CLINIC_OR_DEPARTMENT_OTHER): Payer: Medicare Other | Attending: Internal Medicine | Admitting: Radiology

## 2013-07-06 VITALS — Ht 62.0 in | Wt 218.0 lb

## 2013-07-06 DIAGNOSIS — G4761 Periodic limb movement disorder: Secondary | ICD-10-CM | POA: Insufficient documentation

## 2013-07-06 DIAGNOSIS — R0989 Other specified symptoms and signs involving the circulatory and respiratory systems: Secondary | ICD-10-CM | POA: Insufficient documentation

## 2013-07-06 DIAGNOSIS — G4710 Hypersomnia, unspecified: Secondary | ICD-10-CM | POA: Insufficient documentation

## 2013-07-06 DIAGNOSIS — G4733 Obstructive sleep apnea (adult) (pediatric): Secondary | ICD-10-CM

## 2013-07-06 DIAGNOSIS — R0609 Other forms of dyspnea: Secondary | ICD-10-CM | POA: Insufficient documentation

## 2013-07-11 ENCOUNTER — Encounter: Payer: Self-pay | Admitting: Gastroenterology

## 2013-07-11 ENCOUNTER — Ambulatory Visit (INDEPENDENT_AMBULATORY_CARE_PROVIDER_SITE_OTHER): Payer: Medicare Other | Admitting: Gastroenterology

## 2013-07-11 ENCOUNTER — Other Ambulatory Visit (HOSPITAL_COMMUNITY): Payer: Self-pay | Admitting: Gastroenterology

## 2013-07-11 VITALS — BP 128/90 | HR 107 | Ht 62.0 in | Wt 222.4 lb

## 2013-07-11 DIAGNOSIS — K219 Gastro-esophageal reflux disease without esophagitis: Secondary | ICD-10-CM

## 2013-07-11 DIAGNOSIS — R1310 Dysphagia, unspecified: Secondary | ICD-10-CM

## 2013-07-11 DIAGNOSIS — R1312 Dysphagia, oropharyngeal phase: Secondary | ICD-10-CM

## 2013-07-11 DIAGNOSIS — K3184 Gastroparesis: Secondary | ICD-10-CM

## 2013-07-11 NOTE — Patient Instructions (Addendum)
You have been scheduled for a modified barium swallow on 07/18/13 at 11:00am. Please arrive 15 minutes prior to your test for registration. You will go to [facility] (1st Floor) for your appointment. Please refrain from eating or drinking anything 4 hours prior to your test. Should you need to cancel or reschedule your appointment, please contact [phone] [facility]) or [phone] [facility]). _____________________________________________________________________ A Modified Barium Swallow Study, or MBS, is a special x-ray that is taken to check swallowing skills. It is carried out by a Stage manager and a Psychologist, clinical (SLP). During this test, yourmouth, throat, and esophagus, a muscular tube which connects your mouth to your stomach, is checked. The test will help you, your doctor, and the SLP plan what types of foods and liquids are easier for you to swallow. The SLP will also identify positions and ways to help you swallow more easily and safely. What will happen during an MBS? You will be taken to an x-ray room and seated comfortably. You will be asked to swallow small amounts of food and liquid mixed with barium. Barium is a liquid or paste that allows images of your mouth, throat and esophagus to be seen on x-ray. The x-ray captures moving images of the food you are swallowing as it travels from your mouth through your throat and into your esophagus. This test helps identify whether food or liquid is entering your lungs (aspiration). The test also shows which part of your mouth or throat lacks strength or coordination to move the food or liquid in the right direction. This test typically takes 30 minutes to 1 hour to complete. _______________________________________________________________________  You have been given a Gastroparesis diet.  Thank you for choosing me and [facility].  Pricilla Riffle. Dagoberto Ligas., MD., Marval Regal  cc: Briscoe Deutscher, MD

## 2013-07-11 NOTE — Progress Notes (Signed)
    History of Present Illness: This is a 47 year old female previously followed by Dr. Ardis Hughs who requested to change physicians. She has GERD and gastroparesis and has had a full evaluation by Dr. Ardis Hughs. She complains of an intermittent tightness in her neck. She has vocal problems. She occasionally coughs when swallowing liquids. Barium esophagram performed one year ago was unremarkable.  Current Medications, Allergies, Past Medical History, Past Surgical History, Family History and Social History were reviewed in [employer] record.  Physical Exam: General: Well developed , well nourished, obese, no acute distress Head: Normocephalic and atraumatic Eyes:  sclerae anicteric, EOMI Ears: Normal auditory acuity Mouth: No deformity or lesions Lungs: Clear throughout to auscultation Heart: Regular rate and rhythm; no murmurs, rubs or bruits Abdomen: Soft, non tender and non distended. No masses, hepatosplenomegaly or hernias noted. Normal Bowel sounds Musculoskeletal: Symmetrical with no gross deformities  Pulses:  Normal pulses noted Extremities: No clubbing, cyanosis, edema or deformities noted Neurological: Alert oriented x 4, grossly nonfocal Psychological:  Alert and cooperative. Normal mood and affect  Assessment and Recommendations:  1. GERD. EGD in July 2008 showed no esophagitis or Barrett's. Bravo pH study performed while on Zegerid showed good control of acid while on medicine. Continue omeprazole 40 mg twice a day and standard antireflux measures.  2. Gastroparesis. EGD in July 2008 showed a large amount of retained food in stomach. She has diabetes. Gastroparesis in turn can contribute to reflux. Follow standard gastroparesis diet.  3. Routine risk for colon cancer. Colonsocopy Dr. Collene Mares 08/2004 done for blood in stool and change in bowel habits: normal colonoscopy, normal TI, small internal hemorrhoids, noted to have pain with air insufflation. She was  recommended to have repeat colonoscopy at age 11 (2018)  4. Intermittent liquid dysphagia and neck tightness. 05/2012 barium esophagram was normal. Symptoms do not appear to be gastrointestinal in etiology. Proceed with a repeat barium esophagram and a MBSS.

## 2013-07-16 DIAGNOSIS — G4733 Obstructive sleep apnea (adult) (pediatric): Secondary | ICD-10-CM

## 2013-07-16 NOTE — Sleep Study (Signed)
   NAME: Cheryl [name] DATE OF BIRTH:  03-15-1966 MEDICAL RECORD NUMBER [id number]  LOCATION: [facility]  PHYSICIAN: YOUNG,CLINTON D  DATE OF STUDY: 07/06/2013  SLEEP STUDY TYPE: Nocturnal Polysomnogram               REFERRING PHYSICIAN: Baird Lyons D, MD  INDICATION FOR STUDY: Hypersomnia with sleep apnea. A previous nocturnal polysomnogram on 10/15/2006 had recorded AHI 7.0 per hour. Body weight was 206 pounds.  EPWORTH SLEEPINESS SCORE:   16/24 HEIGHT: 5' 2"  (157.5 cm)  WEIGHT: 218 lb (98.884 kg)    Body mass index is 39.86 kg/(m^2).  NECK SIZE: 16 in.  MEDICATIONS: Charted for review  SLEEP ARCHITECTURE: Total sleep time 372.5 minutes with sleep efficiency 89%. Stage I was 6.7%, stage II 93.3%, stages 3 and REM were absent. Sleep latency 27.5 minutes, awake after sleep onset 18.5 minutes, arousal index 14.3. Bedtime medication: Pro air HFA, carbamazepine, tizanidine, sulindac, omeprazole, amitriptyline, Vascepa, gabapentin, diazepam.  RESPIRATORY DATA: Apnea hypopnea index (AHI) 4 per hour. A total of 25 events were scored including 4 obstructive apneas, 4 central apneas, 17 hypopneas. Events were not positional. She did not qualify for a split CPAP protocol.  OXYGEN DATA: Moderate snoring with oxygen desaturation to a nadir of 86% and mean oxygen saturation to the study of 91.6% on room air  CARDIAC DATA: Sinus rhythm with occasional PVC including 6 beat run of V. tach  MOVEMENT/PARASOMNIA: Periodic limb movement. A total of 125 limb jerks were counted of which 16 were associated with arousal or awakening for periodic limb movement with arousal index of 2.6 per hour. Bathroom x1.  IMPRESSION/ RECOMMENDATION:   1) Occasional respiratory events with sleep disturbance, AHI 4 per hour, within normal limits. The normal range for adults is an AHI between 0 and 5 events per hour. Moderate snoring with oxygen desaturation to a nadir of 86% and mean oxygen  saturation to the study of 91.6% on room air. 2) Periodic limb movement with arousal syndrome, mild. A total of 126 limb jerks were counted of which 16 were associated with arousal or awakening for periodic limb movement with arousal index of 2.6 per hour. These events were associated. with sleep on right side.  3) Sleep architecture was notable for absence of stage III and REM. This may reflect the bedtime medications listed above, which included several sedating medications. 4) A previous nocturnal polysomnogram on 10/15/2006 had recorded AHI 7.0 per hour. Body weight for that study was 206 pounds.   Signed Clinton D. Young M.D.  Deneise Lever Diplomate, American Board of Sleep Medicine  ELECTRONICALLY SIGNED ON:  07/16/2013, 10:06 AM [place] PH: [phone]   FX: [phone] [place]

## 2013-07-18 ENCOUNTER — Ambulatory Visit (HOSPITAL_COMMUNITY)
Admission: RE | Admit: 2013-07-18 | Discharge: 2013-07-18 | Disposition: A | Discharge status: Home / Self Care | Payer: Medicare Other | Source: Ambulatory Visit | Attending: Gastroenterology | Admitting: Gastroenterology

## 2013-07-18 DIAGNOSIS — R1310 Dysphagia, unspecified: Secondary | ICD-10-CM

## 2013-07-18 DIAGNOSIS — K3184 Gastroparesis: Secondary | ICD-10-CM

## 2013-07-18 DIAGNOSIS — R1319 Other dysphagia: Secondary | ICD-10-CM | POA: Insufficient documentation

## 2013-07-18 DIAGNOSIS — R1312 Dysphagia, oropharyngeal phase: Secondary | ICD-10-CM

## 2013-07-18 DIAGNOSIS — R0789 Other chest pain: Secondary | ICD-10-CM | POA: Insufficient documentation

## 2013-07-18 DIAGNOSIS — K219 Gastro-esophageal reflux disease without esophagitis: Secondary | ICD-10-CM

## 2013-07-18 NOTE — Procedures (Signed)
Objective Swallowing Evaluation: Modified Barium Swallowing Study  Patient Details  Name: Cheryl [name] MRN: [id number] Date of Birth: 09-29-1965  Today's Date: 07/18/2013 Time: 1130-1150 SLP Time Calculation (min): 20 min  Past Medical History:  Past Medical History  Diagnosis Date  . GERD (gastroesophageal reflux disease)   . Chronic airway obstruction, not elsewhere classified   . Asthma   . Hyperlipidemia   . Hypothyroidism   . Depression   . Gastroparesis     from DM and chronic narcotic use  . DDD (degenerative disc disease)     CERVIAL AND LUMBAR  . Edema   . Human parvovirus infection   . 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 Fatty liver disease, nonalcoholic   . Positive PPD   . Vitamin D deficiency   . Vocal cord dysfunction   . PONV (postoperative nausea and vomiting)   . Hypertension   . Shortness of breath     "at anytime; it's gotten worse recently" (02/09/2013)  . OSA (obstructive sleep apnea)     "have mask;; don't use it; no one came out to check it" (02/09/2013)  . Type II diabetes mellitus   . Fibromyalgia     "severe" (02/09/2013)  . Peripheral neuropathy     "severe" (02/09/2013)  . Migraine headache     "weekly; worse lately" (02/09/2013)  . Chronic pain     "q where; herniated disc in my tailbone" (02/09/2013)  . Anxiety   . Interstitial cystitis    Past Surgical History:  Past Surgical History  Procedure Laterality Date  . Abdominal hysterectomy  1993  . Cholecystectomy  ?1990  . Nasal sinus surgery  1986; 1990's    "i've had 3" (02/09/2013)  . Knee arthroscopy Left 1990's    "fell; clot behind knee cap had to be removed" (02/09/2013)  . Hip surgery Right 2002    "did something to the tibula band" (02/09/2013)  . Shoulder arthroscopy w/ rotator cuff repair Right     "had to go deep in rotator cuff" (02/09/2013)  . Anterior cervical decomp/discectomy fusion  2004  . Carpal tunnel release  Bilateral ?1980's   HPI:  47 year old female with PMH of fibromyalgia, ACDF in 2004 (C5-6), arthritis, GERD (currently on a PPI), diabetes seen for OP MBS due to c/o globus with and without po intake. Previous barium swallow completed 05/25/12 negative.      Assessment / Plan / Recommendation Clinical Impression  Dysphagia Diagnosis: Suspected primary esophageal dysphagia Clinical impression: Patient presents with a suspected primary esophageal dysphagia characterized by continuous c/o globus sensation causing general anxiety during po intake despite a normal oropharyngeal swallow without evidence of penetration, aspiration, or pharyngeal residuals. Education complete including use of visual feedback regarding results of exam and general esophageal precautions in light of GERD dx. Defer further w/u and/or management to MD.     Treatment Recommendation  No treatment recommended at this time    Diet Recommendation Regular;Thin liquid   Liquid Administration via: Cup;Straw Medication Administration: Whole meds with liquid Supervision: Patient able to self feed Compensations: Slow rate;Small sips/bites;Follow solids with liquid Postural Changes and/or Swallow Maneuvers: Seated upright 90 degrees;Upright 30-60 min after meal    Other  Recommendations Recommended Consults: Consider esophageal assessment (note barium swallow pending following today's exam) Oral Care Recommendations: Oral care BID   Follow Up Recommendations  None  General HPI: 47 year old female with PMH of fibromyalgia, ACDF in 2004 (C5-6), arthritis, GERD (currently on a PPI), diabetes seen for OP MBS due to c/o globus with and without po intake. Previous barium swallow completed 05/25/12 negative.  Type of Study: Modified Barium Swallowing Study Reason for Referral: Objectively evaluate swallowing function Diet Prior to this Study: Regular;Thin liquids Temperature Spikes Noted: No Respiratory Status: Room  air History of Recent Intubation: No Behavior/Cognition: Alert;Cooperative;Pleasant mood (anxious) Oral Cavity - Dentition: Adequate natural dentition Oral Motor / Sensory Function: Impaired motor Oral impairment: Right facial;Right labial (slow movements with good ROM. ) Self-Feeding Abilities: Able to feed self Patient Positioning: Upright in chair Baseline Vocal Quality: Hoarse (pitch breaks) Volitional Cough: Strong Volitional Swallow: Able to elicit Anatomy: Other (Comment) (anterior cervical hardware at C5-6) Pharyngeal Secretions: Not observed secondary MBS    Reason for Referral Objectively evaluate swallowing function   Oral Phase Oral Preparation/Oral Phase Oral Phase: WFL (mild oral holding with purees, patient endorsed anxiety)   Pharyngeal Phase Pharyngeal Phase Pharyngeal Phase: Within functional limits  Cervical Esophageal Phase    GO    Cervical Esophageal Phase Cervical Esophageal Phase: [facility]    Functional Assessment Tool Used: skilled clinical judgement Functional Limitations: Swallowing Swallow Current Status ([id number]): At least 1 percent but less than 20 percent impaired, limited or restricted Swallow Goal Status [phone]): At least 1 percent but less than 20 percent impaired, limited or restricted Swallow Discharge Status [phone]): At least 1 percent but less than 20 percent impaired, limited or restricted   [place], [place] [phone]  Gabriel Rainwater Meryl 07/18/2013, 12:14 PM

## 2013-08-01 ENCOUNTER — Ambulatory Visit (INDEPENDENT_AMBULATORY_CARE_PROVIDER_SITE_OTHER): Payer: Medicare HMO | Admitting: Internal Medicine

## 2013-08-01 ENCOUNTER — Encounter: Payer: Self-pay | Admitting: Internal Medicine

## 2013-08-01 VITALS — BP 120/76 | HR 117 | Ht 62.0 in | Wt 220.4 lb

## 2013-08-01 DIAGNOSIS — R5382 Chronic fatigue, unspecified: Secondary | ICD-10-CM

## 2013-08-01 DIAGNOSIS — G4730 Sleep apnea, unspecified: Principal | ICD-10-CM

## 2013-08-01 DIAGNOSIS — G4710 Hypersomnia, unspecified: Secondary | ICD-10-CM

## 2013-08-01 DIAGNOSIS — G9332 Myalgic encephalomyelitis/chronic fatigue syndrome: Secondary | ICD-10-CM

## 2013-08-01 MED ORDER — OSELTAMIVIR PHOSPHATE 75 MG PO CAPS: 75.0000 mg | ORAL_CAPSULE | Freq: Two times a day (BID) | ORAL | Status: DC

## 2013-08-01 MED ORDER — CLONAZEPAM 0.5 MG PO TABS: ORAL_TABLET | ORAL | Status: DC

## 2013-08-01 NOTE — Patient Instructions (Signed)
Script to try clonazepam - take one about 20 minutes before bedtime. Try this instead of Zanaflex.

## 2013-08-01 NOTE — Progress Notes (Signed)
Subjective:    Patient ID: Cheryl [name], female    DOB: [birth year], 48 y.o.   MRN: [id number]  Asthma Her past medical history is significant for asthma.   June 24, 2010- Allergic rhinitis, OSA, DM, GERD..........................[name] here  Nurse-CC: 4 month follow up visit-wheezing, few asthma attacks, sneezing(using nasal sprays). Sleep- waking up "alot" and other days sleeping all day and not able to wake up well enough.  Now on cefdinir for sinusitis. Sneezing all Fall. Had CT sinus at [facility]- "chronic sinusitis".  Mother reports fall 2 weeks ago- fell onto knees and hands when knees buckled. [name] now when she stands. Evaluated at [name]. Anticipate referral to [facility] neurology. Had flu vax. Still lives same house.  Continues CPAP. When too tight she got pressure marks- discussed. Not smoking. Getting divorced- looking at options.   August 28, 2010-Allergic rhinitis, Asthma, OSA, DM, GERD.............[name] here  Nurse-CC: Follow up per Cheryl Hoehn, FNP. Pt c/o chest pressure and tightness, pains in chest when breathing, cough with very little yellow mucus, increased SOB at rest and with activity x 1 wk.  CPAP 10- Usually using it all night every night. Has had to skip this week while ill.  Says she has been on abx since October for sinusitis- did CT sinus. Gets better then worse. In last 1-2 weeks, increased cough and wheeze , tussive soreness mid anterior and left lateral chest. CBC and CXR at [name]. Says 2 days ago WBC 14000, given nebs. Now on doxy 260m daily.  [facility]   11/11/10- Acute OV-Walk in  Complains of increased SOB, wheezing, dry cough, chest congestion, low grade temp x2days. Has a lot of sinus drainage, cough, post nasal drip and wheezing. Wears CPAP at night, no interference. Teeth hurt at  at times. Has fatigue and no energy.  Has been taking zyrtec without much help. Very dry in sinuses. Frontal headache.    12/27/10- Allergic rhinitis, Asthma, OSA, complicated by DM, GERD  Mother here After seeing [name] in April got welll after 2 rounds of omnicef. Tongue feels funny. Diabetic control has not been good.  Breathing has been good.   07/08/11- 89YOF former smoker, followed for asthma, allergic rhinitis, OSA/ failed CPAP, complicated by DM, GERD.   Mother here and offers her observations.  Says she felt okay until yesterday. She couldn't walk well, was falling and had gone back to using her walker. They called 911 and they told her she was okay except glucose was high. Mother wonders about "inner ear"/vertigo. Treated with eardrops in October for otitis left ear. Has Bell's palsy off and on causing some weakness in the left lower face which other associates with episodes of viral illness such as bronchitis, or allergy. [name], [name], with a total of 3 rounds of antibiotics this fall.  She now says that cough began about the time she began ramipril from [name]    04/29/12- 89YOF former smoker, followed for asthma, allergic rhinitis, OSA/ failed CPAP, complicated by DM, GERD.  Daughter here.  Has had flu shot. Cough, wheezing x 4 days; states friend of hers passed last Friday with [name].  Recent cold with increased wheeze, cough, sweating, hoarseness. Reports temperature 101. Scant sputum was green and nasal discharge green  but no headache. She drops off CPAP/ 10 when feeling badly She brings a driving safety assessment form from the [name].  09/06/12- 59 YOF former smoker, followed for asthma, allergic rhinitis, OSA/ failed CPAP, complicated by DM, GERD.  Mother here.  Has had flu shot. FOLLOWS [id number] deep cough and wheezing; also states that [name] came picked up CPAP machine due to Medicare and  stating patient not using it for 90 days. Daily wheeze using rescue inhaler or nebulizer to control.no major distress. We discussed ways to provide more stability. Diabetes and persistent thrush interfere with steroid choices. CXR 05/06/12 IMPRESSION:  No evidence of acute cardiopulmonary disease.  Original Report Authenticated By: [name], M.D.  12/01/2012 Acute OV  Complains of wheezing, chest tightness, dry cough, increased SOB - reports no improvement since last ov, worse this AM for 2 weeks .  Has been doing nebs 4x daily. No hemoptysis, n/v, orthopnea, edema, or chest pain.  No otc used. Used Tussionex with some help.  05/04/13  ER follow up  ER follow up , cough, wheezing for weeks with right sided pleuritic pain.  Patient went to the emergency room on October 7 for shortness, of breath and right-sided chest, and abdominal pain. Workup was unrevealing, with a negative. Chest x-ray, abdominal ultrasound and lab work Showed only minimally, elevated LFTs. Patient reports that she continues to have some pain. On her right rib area, especially, if she coughs. Patient describes a dry cough, and intermittent wheezing. She denies any discolored mucus or fever. Orthopnea, PND, leg swelling, or calf pain. Patient says she is currently being evaluated by neurology for her weakness, and muscle spasms.   Says cough is keeping her up at night .   05/31/13- 44 yoF46 YOF former smoker, followed for asthma, allergic rhinitis, OSA/ failed CPAP, complicated by DM, GERD.  Female friend here.   FOLLOWS FOR: has spasms in throat-getting choked up when eating or taking medications. Would like to try to get CPAP back-told that she is stopping breathing again. Had CPAP 11/ [name]- taken away because of noncompliance Complains of "muscle spasms" in throat-chokes easily. This is episodic. She is seeing a neurologist at [facility] and is on Zanaflex. We discussed reflux precautions. Friend says she snores and stops  breathing. Sleeping in recliner because she says "hurt all over".  08/01/12- 84 yoF46 YOF former smoker, followed for asthma, allergic rhinitis, OSA/ failed CPAP, complicated by DM, GERD.  Mother here FOLLOWS FOR: had to repeat Sleep Study in December- She sleeps in a recliner to avoid back and leg pains. Muscle jerks bother her while sleeping. NPSG 07/06/13 WNL- AHI 4/ hr; PLMAs 2.6/ hr.  Review of Systems- see HPI Constitutional:   No-   weight loss, night sweats, fevers, chills,+irregular sleep schedule, +fatigue, lassitude. HEENT:   No-headaches, difficulty swallowing, tooth/dental problems, +sore throat,       No-  sneezing, itching, +ear ache, nasal congestion, post nasal drip,  CV:  No- chest pain, orthopnea, PND, swelling in lower extremities, anasarca,  dizziness, palpitations Resp: No- acute  shortness of breath with exertion or at rest.              No- productive cough,  + non-productive cough,  No- coughing up of blood.              No- change in color of mucus.   Skin: No-   rash or lesions. GI:  No-   heartburn, indigestion, abdominal pain, nausea, vomiting,  GU:  MS:  No-   joint pain or swelling.   Neuro-    +tremor Psych:  No- change in mood or affect. No depression or anxiety.  No memory loss.  Objective:   Physical Exam     She came on very cold day, not wearing a coat ! OBJ- Physical Exam General- Alert, Oriented, Affect-appropriate, Distress- none acute. overweight Skin- rash-none, lesions- none, excoriation- none Lymphadenopathy- none Head- atraumatic            Eyes- Gross vision intact, PERRLA, conjunctivae and secretions clear            Ears- Hearing, canals-normal            Nose- Clear, no-Septal dev, mucus, polyps, erosion, perforation             Throat- Mallampati II , mucosa clear , drainage- none, tonsils- atrophic, not hoarse Neck- flexible , trachea midline, no stridor , thyroid nl, carotid no bruit Chest - symmetrical excursion , unlabored            Heart/CV- RRR , no murmur , no gallop  , no rub, nl s1 s2                           - JVD- none , edema- none, stasis changes- none, varices- none           Lung- clear to P&A, wheeze- none, cough+light , dullness-none, rub- none           Chest wall-  Abd-  Br/ Gen/ Rectal- Not done, not indicated Extrem- cyanosis- none, clubbing, none, atrophy- none, strength- nl Neuro- +hesitant speech, +intention tremor

## 2013-08-24 NOTE — Assessment & Plan Note (Signed)
She does not meet scoring criteria for diagnosis of sleep apnea and does not qualify to get a new CPAP machine. She had a few limb jerks, but not enough for significant sleep disruption. Occasional snoring and daytime fatigue are discussed. Plan-high clonazepam instead of Zanaflex to consolidate sleep

## 2013-08-24 NOTE — Assessment & Plan Note (Signed)
NPSG 07/06/13 WNL- AHI 4/ hr; PLMAs 2.6/ hr.

## 2013-10-06 ENCOUNTER — Other Ambulatory Visit: Payer: Self-pay | Admitting: *Deleted

## 2013-10-06 MED ORDER — ALBUTEROL SULFATE HFA 108 (90 BASE) MCG/ACT IN AERS: INHALATION_SPRAY | RESPIRATORY_TRACT | Status: DC

## 2013-11-01 ENCOUNTER — Encounter: Payer: Self-pay | Admitting: Internal Medicine

## 2013-11-01 ENCOUNTER — Ambulatory Visit (INDEPENDENT_AMBULATORY_CARE_PROVIDER_SITE_OTHER): Payer: Commercial Managed Care - HMO | Admitting: Internal Medicine

## 2013-11-01 ENCOUNTER — Telehealth: Payer: Self-pay | Admitting: Internal Medicine

## 2013-11-01 VITALS — BP 122/78 | HR 109 | Ht 62.0 in | Wt 230.0 lb

## 2013-11-01 DIAGNOSIS — J45909 Unspecified asthma, uncomplicated: Secondary | ICD-10-CM

## 2013-11-01 DIAGNOSIS — J328 Other chronic sinusitis: Secondary | ICD-10-CM

## 2013-11-01 DIAGNOSIS — R5382 Chronic fatigue, unspecified: Secondary | ICD-10-CM

## 2013-11-01 DIAGNOSIS — G9332 Myalgic encephalomyelitis/chronic fatigue syndrome: Secondary | ICD-10-CM

## 2013-11-01 MED ORDER — AZITHROMYCIN 250 MG PO TABS: ORAL_TABLET | ORAL | Status: DC

## 2013-11-01 MED ORDER — AZELASTINE-FLUTICASONE 137-50 MCG/ACT NA SUSP: 1.0000 | NASAL | Status: DC

## 2013-11-01 MED ORDER — CLONAZEPAM 0.5 MG PO TABS: ORAL_TABLET | ORAL | Status: DC

## 2013-11-01 NOTE — Telephone Encounter (Signed)
Ok to refill klonopin

## 2013-11-01 NOTE — Telephone Encounter (Signed)
RX has been called in and pt aware. Nothing further needed

## 2013-11-01 NOTE — Telephone Encounter (Signed)
Spoke with pt and she is requesting refill on her klonopin 0.26m rx. [facility] please advise CY if this is ok to refill

## 2013-11-01 NOTE — Patient Instructions (Addendum)
Sample Dymista nasal spray   1-2 puffs each nostril once daily at bedtime  Ok to continue using your asthma medicines  Script for Z pak sent

## 2013-11-01 NOTE — Progress Notes (Signed)
Subjective:    Patient ID: Cheryl [name], female    DOB: 1965-12-13, 48 y.o.   MRN: [id number]  Asthma Her past medical history is significant for asthma.   June 24, 2010- Allergic rhinitis, OSA, DM, GERD..........................[name] here  Nurse-CC: 4 month follow up visit-wheezing, few asthma attacks, sneezing(using nasal sprays). Sleep- waking up "alot" and other days sleeping all day and not able to wake up well enough.  Now on cefdinir for sinusitis. Sneezing all Fall. Had CT sinus at [facility]- "chronic sinusitis".  Mother reports fall 2 weeks ago- fell onto knees and hands when knees buckled. [facility]yl [name] now when she stands. Evaluated at [facility]. Anticipate referral to [facility] neurology. Had flu vax. Still lives same house.  Continues CPAP. When too tight she got pressure marks- discussed. Not smoking. Getting divorced- looking at options.   August 28, 2010-Allergic rhinitis, Asthma, OSA, DM, GERD.............[name] here  Nurse-CC: Follow up per [name], FNP. Pt c/o chest pressure and tightness, pains in chest when breathing, cough with very little yellow mucus, increased SOB at rest and with activity x 1 wk.  CPAP 10- Usually using it all night every night. Has had to skip this week while ill.  Says she has been on abx since October for sinusitis- did CT sinus. Gets better then worse. In last 1-2 weeks, increased cough and wheeze , tussive soreness mid anterior and left lateral chest. CBC and CXR at [name]. Says 2 days ago WBC 14000, given nebs. Now on doxy 252m daily.  [facility]   11/11/10- Acute OV-Walk in  Complains of increased SOB, wheezing, dry cough, chest congestion, low grade temp x2days. Has a lot of sinus drainage, cough, post nasal drip and wheezing. Wears CPAP at night, no interference. Teeth hurt at  at times. Has fatigue and no energy.  Has been taking zyrtec without much help. Very dry in sinuses. Frontal headache.    12/27/10- Allergic rhinitis, Asthma, OSA, complicated by DM, GERD  Mother here After seeing [facility] in April got welll after 2 rounds of omnicef. Tongue feels funny. Diabetic control has not been good.  Breathing has been good.   07/08/11- 89YOF former smoker, followed for asthma, allergic rhinitis, OSA/ failed CPAP, complicated by DM, GERD.   Mother here and offers her observations.  Says she felt okay until yesterday. She couldn't walk well, was falling and had gone back to using her walker. They called 911 and they told her she was okay except glucose was high. Mother wonders about "inner ear"/vertigo. Treated with eardrops in October for otitis left ear. Has Bell's palsy off and on causing some weakness in the left lower face which other associates with episodes of viral illness such as bronchitis, or allergy. [name] she hasn't felt right since October-band like head tightness dizziness, [name], with a total of 3 rounds of antibiotics this fall.  She now says that cough began about the time she began ramipril from Dr KDwyane Dee    04/29/12- 89YOF former smoker, followed for asthma, allergic rhinitis, OSA/ failed CPAP, complicated by DM, GERD.  Daughter here.  Has had flu shot. Cough, wheezing x 4 days; states friend of hers passed last Friday with MRSA.  Recent cold with increased wheeze, cough, sweating, hoarseness. Reports temperature 101. Scant sputum was green and nasal discharge green  but no headache. 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  09/06/12- 23 YOF former smoker, followed for asthma, allergic rhinitis, OSA/ failed CPAP, complicated by DM, GERD.  Mother here.  Has had flu shot. FOLLOWS RJJ:OACZYS deep cough and wheezing; also states that [facility] came picked up CPAP machine due to Medicare and  stating patient not using it for 90 days. Daily wheeze using rescue inhaler or nebulizer to control.no major distress. We discussed ways to provide more stability. Diabetes and persistent thrush interfere with steroid choices. CXR 05/06/12 IMPRESSION:  No evidence of acute cardiopulmonary disease.  Original Report Authenticated By: [name], M.D.  12/01/2012 Acute OV  Complains of wheezing, chest tightness, dry cough, increased SOB - reports no improvement since last ov, worse this AM for 2 weeks .  Has been doing nebs 4x daily. No hemoptysis, n/v, orthopnea, edema, or chest pain.  No otc used. Used Tussionex with some help.  05/04/13  ER follow up  ER follow up , cough, wheezing for weeks with right sided pleuritic pain.  Patient went to the emergency room on October 7 for shortness, of breath and right-sided chest, and abdominal pain. Workup was unrevealing, with a negative. Chest x-ray, abdominal ultrasound and lab work Showed only minimally, elevated LFTs. Patient reports that she continues to have some pain. On her right rib area, especially, if she coughs. Patient describes a dry cough, and intermittent wheezing. She denies any discolored mucus or fever. Orthopnea, PND, leg swelling, or calf pain. Patient says she is currently being evaluated by neurology for her weakness, and muscle spasms.   Says cough is keeping her up at night .   05/31/13- 25 yoF46 YOF former smoker, followed for asthma, allergic rhinitis, OSA/ failed CPAP, complicated by DM, GERD.  Female friend here.   FOLLOWS FOR: has spasms in throat-getting choked up when eating or taking medications. Would like to try to get CPAP back-told that she is stopping breathing again. Had CPAP 11/ [facility]- taken away because of noncompliance Complains of "muscle spasms" in throat-chokes easily. This is episodic. She is seeing a neurologist at [facility] and is on Zanaflex. We discussed reflux precautions. Friend says she snores and stops  breathing. Sleeping in recliner because she says "hurt all over".  08/01/12- 45 yoF46 YOF former smoker, followed for asthma, allergic rhinitis, OSA/ failed CPAP, complicated by DM, GERD.  Mother here FOLLOWS FOR: had to repeat Sleep Study in December- She sleeps in a recliner to avoid back and leg pains. Muscle jerks bother her while sleeping. NPSG 07/06/13 WNL- AHI 4/ hr; PLMAs 2.6/ hr.  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  Mother here FOLLOWS FOR:can not tell a difference with just clonazepam; has had to use Zanaflex with clonazepam. Pt states she is waking up at least 2 times during the night having to use her rescue inhaler and neb tx. Also continues to use Dulera. Green sinus drainage with limited headache. Sleeps in a recliner. Brings DMV form.  Review of Systems- see HPI Constitutional:   No-   weight loss, night sweats, fevers, chills,+irregular sleep schedule, +fatigue, lassitude. HEENT:   No-headaches, difficulty swallowing, tooth/dental problems, +sore throat,       No-  sneezing, itching, +ear ache, nasal congestion, post nasal drip,  CV:  No- chest pain, orthopnea, PND, swelling in lower extremities, anasarca,  dizziness, palpitations Resp: No- acute  shortness of breath with exertion or at rest.              No- productive cough,  + non-productive cough,  No- coughing up of blood.              No- change in color of mucus.   Skin: No-   rash or lesions. GI:  No-   heartburn, indigestion, abdominal pain, nausea, vomiting,  GU:  MS:  No-   joint pain or swelling.   Neuro-    +tremor Psych:  No- change in mood or affect. No depression or anxiety.  No memory loss.  Objective:   Physical Exam      OBJ- Physical Exam General- Alert, Oriented, Affect-appropriate, Distress- none acute. overweight Skin- rash-none, lesions- none, excoriation- none Lymphadenopathy- none Head-  atraumatic            Eyes- Gross vision intact, PERRLA, conjunctivae and secretions clear            Ears- Hearing, canals-normal            Nose- +sniffing, +turbinate edema, no-Septal dev, mucus, polyps, erosion, perforation             Throat- Mallampati II , mucosa clear , drainage- none, tonsils- atrophic, not hoarse Neck- flexible , trachea midline, no stridor , thyroid nl, carotid no bruit Chest - symmetrical excursion , unlabored           Heart/CV- RRR , no murmur , no gallop  , no rub, nl s1 s2                           - JVD- none , edema- none, stasis changes- none, varices- none           Lung- clear to P&A, wheeze- none, cough+light , dullness-none, rub- none           Chest wall-  Abd-  Br/ Gen/ Rectal- Not done, not indicated Extrem- cyanosis- none, clubbing, none, atrophy- none, strength- nl Neuro- +hesitant speech, +intention tremor

## 2013-11-08 ENCOUNTER — Telehealth: Payer: Self-pay | Admitting: Internal Medicine

## 2013-11-08 MED ORDER — DOXYCYCLINE HYCLATE 100 MG PO TABS: ORAL_TABLET | ORAL | Status: DC

## 2013-11-08 NOTE — Telephone Encounter (Signed)
Suggest doxycycline 100 mg, # 8, 2 today then one daily   Also mucinex-DM

## 2013-11-08 NOTE — Telephone Encounter (Signed)
Pt is aware of CY's recs. Rx has been sent in. Nothing further was needed.

## 2013-11-08 NOTE — Telephone Encounter (Signed)
Pt c/o chest cong, constant prod cough-green-yellow phlem, wheezing, chest tx, increase SOB. Pt was giving ZPAK 11/01/13 not doing better. She took mucinex and did not help. Requesting recs. Please advise Dr. Sunny Schlein  Allergies  Allergen Reactions  . Influenza Vaccines     Throat swelling, "flu" symptoms  . Levofloxacin Anaphylaxis and Hives  . Sulfonamide Derivatives Anaphylaxis  . Biaxin [Clarithromycin] Hives  . Nucynta [Tapentadol Hydrochloride] Other (See Comments)    Halucinations insomnia x3 days  . Vilazodone Hcl Other (See Comments)    Hallucinations  . Amoxicillin Hives  . Carbamazepine   . Ciprofloxacin Hives  . Iohexol      Desc: hives,dyspnea; throat swelling; ok w/ premeds and omnipaque   . Pregabalin Other (See Comments)    Reaction-Syncope & unable to speak  . Percocet [Oxycodone-Acetaminophen] Rash     Current Outpatient Prescriptions on File Prior to Visit  Medication Sig Dispense Refill  . albuterol (PROAIR HFA) 108 (90 BASE) MCG/ACT inhaler INHALE 2 PUFFS INTO THE LUNGS EVERY 6 HOURS AS NEEDED.  8.5 g  2  . albuterol (PROVENTIL) (2.5 MG/3ML) 0.083% nebulizer solution Take 2.5 mg by nebulization every 6 (six) hours as needed. For wheezing      . aspirin EC 81 MG tablet Take 81 mg by mouth daily.      [name] aspirin-acetaminophen-caffeine (EXCEDRIN MIGRAINE) 250-250-65 MG per tablet Take 1 tablet by mouth every 6 (six) hours as needed for pain.      . Azelastine-Fluticasone (DYMISTA) 137-50 MCG/ACT SUSP Place 1-2 puffs into the nose 1 day or 1 dose.  1 Bottle  0  . azithromycin (ZITHROMAX) 250 MG tablet 2 today then one daily  6 tablet  0  . bumetanide (BUMEX) 1 MG tablet Take 1 mg by mouth daily.      . clonazePAM (KLONOPIN) 0.5 MG tablet 1 for sleep as needed  30 tablet  2  . diazepam (VALIUM) 5 MG tablet Take 5 mg by mouth at bedtime as needed for sleep.      [name] diltiazem (CARDIZEM CD) 240 MG 24 hr capsule Take 240 mg by mouth daily.        [name] gabapentin  (NEURONTIN) 800 MG tablet Take 800 mg by mouth 3 (three) times daily.       [name] glucose blood (ONE TOUCH ULTRA TEST) test strip Use as directed to check blood glucose, 4 times per day dx code 250.02  150 each  5  . insulin regular human CONCENTRATED (HUMULIN R) 500 UNIT/ML SOLN injection Inject 130 Units into the skin daily. Pt takes if BS>200. Take addition 4 units when eating a meal.      . ipratropium-albuterol (DUONEB) 0.5-2.5 (3) MG/3ML SOLN USE 1 VIAL IN NEBULIZER EVERY 2 HOURS AS NEEDED  90 mL  1  . levothyroxine (SYNTHROID, LEVOTHROID) 100 MCG tablet Take 100 mcg by mouth daily before breakfast.      . mometasone-formoterol (DULERA) 100-5 MCG/ACT AERO Inhale 2 puffs into the lungs 2 (two) times daily.  13 g  5  . MONOJECT INS SYR 1CC/30G 30G X 5/16" 1 ML MISC Use as directed to administer insulin      . naproxen sodium (ANAPROX) 220 MG tablet Take 220 mg by mouth 2 (two) times daily with a meal.      . nebivolol (BYSTOLIC) 5 MG tablet Take 2.5 mg by mouth daily.       [name] omeprazole (PRILOSEC) 40 MG capsule Take 40 mg by mouth  2 (two) times daily.       [name] tiZANidine (ZANAFLEX) 4 MG capsule Take 4 mg by mouth 3 (three) times daily as needed. For muscle spasms      . Vitamin D, Ergocalciferol, (DRISDOL) 50000 UNITS CAPS Take 50,000 Units by mouth every 7 (seven) days. Takes on Wed       No current facility-administered medications on file prior to visit.

## 2013-11-10 ENCOUNTER — Other Ambulatory Visit: Payer: Self-pay | Admitting: Family Medicine

## 2013-11-29 NOTE — Assessment & Plan Note (Signed)
Currently better controlled

## 2013-11-29 NOTE — Assessment & Plan Note (Signed)
We reviewed sleep hygiene and talked again about her sleep study results. Discussed the impact of leg jerks and she will pay more attention to this

## 2013-11-29 NOTE — Assessment & Plan Note (Signed)
Plan-saline rinse, try sample Dymista

## 2014-01-20 ENCOUNTER — Other Ambulatory Visit: Payer: Self-pay | Admitting: Internal Medicine

## 2014-01-20 ENCOUNTER — Telehealth: Payer: Self-pay | Admitting: Internal Medicine

## 2014-01-20 MED ORDER — ALBUTEROL SULFATE HFA 108 (90 BASE) MCG/ACT IN AERS: INHALATION_SPRAY | RESPIRATORY_TRACT | Status: DC

## 2014-01-20 NOTE — Telephone Encounter (Signed)
Pt aware RX has been sent. Nothing further needed 

## 2014-01-24 ENCOUNTER — Telehealth: Payer: Self-pay | Admitting: Internal Medicine

## 2014-01-24 MED ORDER — CLONAZEPAM 0.5 MG PO TABS: ORAL_TABLET | ORAL | Status: DC

## 2014-01-24 NOTE — Telephone Encounter (Signed)
Ok to refill 

## 2014-01-24 NOTE — Telephone Encounter (Signed)
Called and spoke with pt and she stated that she is needing a refill of her clonazepam 0.5 to be sent in to [facility].  Pt stated that the pharmacy told her that they have been trying to contact us about her rx.  CY please advise if ok to refill. Thanks  Last ov--11/01/13 Next ov--05/03/14  Allergies  Allergen Reactions  . Influenza Vaccines     Throat swelling, "flu" symptoms  . Levofloxacin Anaphylaxis and Hives  . Sulfonamide Derivatives Anaphylaxis  . Biaxin [Clarithromycin] Hives  . Nucynta [Tapentadol Hydrochloride] Other (See Comments)    Halucinations insomnia x3 days  . Vilazodone Hcl Other (See Comments)    Hallucinations  . Amoxicillin Hives  . Carbamazepine   . Ciprofloxacin Hives  . Iohexol      Desc: hives,dyspnea; throat swelling; ok w/ premeds and omnipaque   . Pregabalin Other (See Comments)    Reaction-Syncope & unable to speak  . Percocet [Oxycodone-Acetaminophen] Rash    Current Outpatient Prescriptions on File Prior to Visit  Medication Sig Dispense Refill  . albuterol (PROAIR HFA) 108 (90 BASE) MCG/ACT inhaler INHALE 2 PUFFS INTO THE LUNGS EVERY 6 HOURS AS NEEDED.  8.5 g  2  . albuterol (PROVENTIL) (2.5 MG/3ML) 0.083% nebulizer solution Take 2.5 mg by nebulization every 6 (six) hours as needed. For wheezing      . aspirin EC 81 MG tablet Take 81 mg by mouth daily.      [name] aspirin-acetaminophen-caffeine (EXCEDRIN MIGRAINE) 250-250-65 MG per tablet Take 1 tablet by mouth every 6 (six) hours as needed for pain.      . Azelastine-Fluticasone (DYMISTA) 137-50 MCG/ACT SUSP Place 1-2 puffs into the nose 1 day or 1 dose.  1 Bottle  0  . azithromycin (ZITHROMAX) 250 MG tablet 2 today then one daily  6 tablet  0  . bumetanide (BUMEX) 1 MG tablet Take 1 mg by mouth daily.      . clonazePAM (KLONOPIN) 0.5 MG tablet 1 for sleep as needed  30 tablet  2  . diazepam (VALIUM) 5 MG tablet Take 5 mg by mouth at bedtime as needed for sleep.      [name] diltiazem (CARDIZEM CD) 240 MG 24  hr capsule Take 240 mg by mouth daily.        [name] doxycycline (VIBRA-TABS) 100 MG tablet Take 2 tablets today, then 1 tablet daily until gone  8 tablet  0  . gabapentin (NEURONTIN) 800 MG tablet Take 800 mg by mouth 3 (three) times daily.       [name] glucose blood (ONE TOUCH ULTRA TEST) test strip Use as directed to check blood glucose, 4 times per day dx code 250.02  150 each  5  . insulin regular human CONCENTRATED (HUMULIN R) 500 UNIT/ML SOLN injection Inject 130 Units into the skin daily. Pt takes if BS>200. Take addition 4 units when eating a meal.      . ipratropium-albuterol (DUONEB) 0.5-2.5 (3) MG/3ML SOLN USE 1 VIAL IN NEBULIZER EVERY 2 HOURS AS NEEDED  90 mL  1  . levothyroxine (SYNTHROID, LEVOTHROID) 100 MCG tablet Take 100 mcg by mouth daily before breakfast.      . mometasone-formoterol (DULERA) 100-5 MCG/ACT AERO Inhale 2 puffs into the lungs 2 (two) times daily.  13 g  5  . MONOJECT INS SYR 1CC/30G 30G X 5/16" 1 ML MISC Use as directed to administer insulin      . naproxen sodium (ANAPROX) 220 MG tablet Take 220  mg by mouth 2 (two) times daily with a meal.      . nebivolol (BYSTOLIC) 5 MG tablet Take 2.5 mg by mouth daily.       [name] omeprazole (PRILOSEC) 40 MG capsule Take 40 mg by mouth 2 (two) times daily.       [name] PROAIR HFA 108 (90 BASE) MCG/ACT inhaler INHALE 2 PUFFS INTO THE LUNGS EVERY 6 HOURS AS NEEDED.  8.5 g  2  . tiZANidine (ZANAFLEX) 4 MG capsule Take 4 mg by mouth 3 (three) times daily as needed. For muscle spasms      . Vitamin D, Ergocalciferol, (DRISDOL) 50000 UNITS CAPS Take 50,000 Units by mouth every 7 (seven) days. Takes on Wed       No current facility-administered medications on file prior to visit.

## 2014-01-24 NOTE — Telephone Encounter (Signed)
RX called into [facility]. Pt aware. Nothing further needed

## 2014-02-02 ENCOUNTER — Encounter (HOSPITAL_COMMUNITY): Payer: Self-pay | Admitting: Emergency Medicine

## 2014-02-02 ENCOUNTER — Inpatient Hospital Stay (HOSPITAL_COMMUNITY)
Admission: EM | Admit: 2014-02-02 | Discharge: 2014-02-05 | DRG: 638 | Disposition: A | Discharge status: Home / Self Care | Payer: Medicare HMO | Attending: Internal Medicine | Admitting: Internal Medicine

## 2014-02-02 DIAGNOSIS — E860 Dehydration: Secondary | ICD-10-CM

## 2014-02-02 DIAGNOSIS — J40 Bronchitis, not specified as acute or chronic: Secondary | ICD-10-CM

## 2014-02-02 DIAGNOSIS — R6510 Systemic inflammatory response syndrome (SIRS) of non-infectious origin without acute organ dysfunction: Secondary | ICD-10-CM

## 2014-02-02 DIAGNOSIS — R000 Tachycardia, unspecified: Secondary | ICD-10-CM

## 2014-02-02 DIAGNOSIS — F329 Major depressive disorder, single episode, unspecified: Secondary | ICD-10-CM | POA: Diagnosis present

## 2014-02-02 DIAGNOSIS — J441 Chronic obstructive pulmonary disease with (acute) exacerbation: Secondary | ICD-10-CM

## 2014-02-02 DIAGNOSIS — K3184 Gastroparesis: Secondary | ICD-10-CM | POA: Diagnosis present

## 2014-02-02 DIAGNOSIS — Z79899 Other long term (current) drug therapy: Secondary | ICD-10-CM

## 2014-02-02 DIAGNOSIS — B3781 Candidal esophagitis: Secondary | ICD-10-CM

## 2014-02-02 DIAGNOSIS — E1110 Type 2 diabetes mellitus with ketoacidosis without coma: Secondary | ICD-10-CM | POA: Diagnosis present

## 2014-02-02 DIAGNOSIS — Z6841 Body Mass Index (BMI) 40.0 and over, adult: Secondary | ICD-10-CM

## 2014-02-02 DIAGNOSIS — R609 Edema, unspecified: Secondary | ICD-10-CM

## 2014-02-02 DIAGNOSIS — K760 Fatty (change of) liver, not elsewhere classified: Secondary | ICD-10-CM

## 2014-02-02 DIAGNOSIS — E039 Hypothyroidism, unspecified: Secondary | ICD-10-CM

## 2014-02-02 DIAGNOSIS — E1165 Type 2 diabetes mellitus with hyperglycemia: Secondary | ICD-10-CM

## 2014-02-02 DIAGNOSIS — R058 Other specified cough: Secondary | ICD-10-CM

## 2014-02-02 DIAGNOSIS — J328 Other chronic sinusitis: Secondary | ICD-10-CM

## 2014-02-02 DIAGNOSIS — J449 Chronic obstructive pulmonary disease, unspecified: Secondary | ICD-10-CM | POA: Diagnosis present

## 2014-02-02 DIAGNOSIS — T464X5A Adverse effect of angiotensin-converting-enzyme inhibitors, initial encounter: Secondary | ICD-10-CM

## 2014-02-02 DIAGNOSIS — Z794 Long term (current) use of insulin: Secondary | ICD-10-CM

## 2014-02-02 DIAGNOSIS — J45909 Unspecified asthma, uncomplicated: Secondary | ICD-10-CM | POA: Diagnosis present

## 2014-02-02 DIAGNOSIS — E1149 Type 2 diabetes mellitus with other diabetic neurological complication: Secondary | ICD-10-CM | POA: Diagnosis present

## 2014-02-02 DIAGNOSIS — F3289 Other specified depressive episodes: Secondary | ICD-10-CM | POA: Diagnosis present

## 2014-02-02 DIAGNOSIS — E119 Type 2 diabetes mellitus without complications: Secondary | ICD-10-CM | POA: Diagnosis present

## 2014-02-02 DIAGNOSIS — R9431 Abnormal electrocardiogram [ECG] [EKG]: Secondary | ICD-10-CM

## 2014-02-02 DIAGNOSIS — E162 Hypoglycemia, unspecified: Secondary | ICD-10-CM

## 2014-02-02 DIAGNOSIS — R5382 Chronic fatigue, unspecified: Secondary | ICD-10-CM

## 2014-02-02 DIAGNOSIS — I10 Essential (primary) hypertension: Secondary | ICD-10-CM | POA: Diagnosis present

## 2014-02-02 DIAGNOSIS — J383 Other diseases of vocal cords: Secondary | ICD-10-CM

## 2014-02-02 DIAGNOSIS — J4489 Other specified chronic obstructive pulmonary disease: Secondary | ICD-10-CM | POA: Diagnosis present

## 2014-02-02 DIAGNOSIS — J4540 Moderate persistent asthma, uncomplicated: Secondary | ICD-10-CM

## 2014-02-02 DIAGNOSIS — E876 Hypokalemia: Secondary | ICD-10-CM

## 2014-02-02 DIAGNOSIS — G4733 Obstructive sleep apnea (adult) (pediatric): Secondary | ICD-10-CM | POA: Diagnosis present

## 2014-02-02 DIAGNOSIS — IMO0001 Reserved for inherently not codable concepts without codable children: Secondary | ICD-10-CM

## 2014-02-02 DIAGNOSIS — K219 Gastro-esophageal reflux disease without esophagitis: Secondary | ICD-10-CM | POA: Diagnosis present

## 2014-02-02 DIAGNOSIS — B343 Parvovirus infection, unspecified: Secondary | ICD-10-CM

## 2014-02-02 DIAGNOSIS — R05 Cough: Secondary | ICD-10-CM

## 2014-02-02 DIAGNOSIS — E669 Obesity, unspecified: Secondary | ICD-10-CM | POA: Diagnosis present

## 2014-02-02 DIAGNOSIS — G609 Hereditary and idiopathic neuropathy, unspecified: Secondary | ICD-10-CM | POA: Diagnosis present

## 2014-02-02 DIAGNOSIS — G9332 Myalgic encephalomyelitis/chronic fatigue syndrome: Secondary | ICD-10-CM

## 2014-02-02 DIAGNOSIS — M1480 Arthropathies in other specified diseases classified elsewhere, unspecified site: Secondary | ICD-10-CM

## 2014-02-02 DIAGNOSIS — B370 Candidal stomatitis: Secondary | ICD-10-CM

## 2014-02-02 DIAGNOSIS — E1310 Other specified diabetes mellitus with ketoacidosis without coma: Principal | ICD-10-CM | POA: Diagnosis present

## 2014-02-02 DIAGNOSIS — E785 Hyperlipidemia, unspecified: Secondary | ICD-10-CM | POA: Diagnosis present

## 2014-02-02 DIAGNOSIS — R7611 Nonspecific reaction to tuberculin skin test without active tuberculosis: Secondary | ICD-10-CM

## 2014-02-02 DIAGNOSIS — F411 Generalized anxiety disorder: Secondary | ICD-10-CM | POA: Diagnosis present

## 2014-02-02 DIAGNOSIS — E109 Type 1 diabetes mellitus without complications: Secondary | ICD-10-CM

## 2014-02-02 LAB — CBG MONITORING, ED: Glucose-Capillary: 348 mg/dL — ABNORMAL HIGH (ref 70–99)

## 2014-02-02 NOTE — ED Notes (Signed)
Pt. reports elevated blood sugar  / ketonuria  today , seen at [facility] clinic today for productive cough and SOB diagnosed with asthma exacerbation prescribed with oral antibiotic.

## 2014-02-03 ENCOUNTER — Emergency Department (HOSPITAL_COMMUNITY): Payer: Medicare HMO

## 2014-02-03 DIAGNOSIS — E1110 Type 2 diabetes mellitus with ketoacidosis without coma: Secondary | ICD-10-CM | POA: Diagnosis present

## 2014-02-03 DIAGNOSIS — I10 Essential (primary) hypertension: Secondary | ICD-10-CM

## 2014-02-03 DIAGNOSIS — E1310 Other specified diabetes mellitus with ketoacidosis without coma: Principal | ICD-10-CM

## 2014-02-03 LAB — URINALYSIS, ROUTINE W REFLEX MICROSCOPIC
Bilirubin Urine: NEGATIVE
Glucose, UA: >1000 mg/dL — AB
Hgb urine dipstick: NEGATIVE
Ketones, ur: >80 mg/dL — AB
Leukocytes, UA: NEGATIVE
Nitrite: NEGATIVE
Protein, ur: NEGATIVE mg/dL
Specific Gravity, Urine: 1.038 — ABNORMAL HIGH (ref 1.005–1.030)
Urobilinogen, UA: 0.2 mg/dL (ref 0.0–1.0)
pH: 5.5 (ref 5.0–8.0)

## 2014-02-03 LAB — GLUCOSE, CAPILLARY
Glucose-Capillary: 278 mg/dL — ABNORMAL HIGH (ref 70–99)
Glucose-Capillary: 322 mg/dL — ABNORMAL HIGH (ref 70–99)
Glucose-Capillary: 315 mg/dL — ABNORMAL HIGH (ref 70–99)
Glucose-Capillary: 319 mg/dL — ABNORMAL HIGH (ref 70–99)
Glucose-Capillary: 285 mg/dL — ABNORMAL HIGH (ref 70–99)
Glucose-Capillary: 305 mg/dL — ABNORMAL HIGH (ref 70–99)
Glucose-Capillary: 303 mg/dL — ABNORMAL HIGH (ref 70–99)
Glucose-Capillary: 281 mg/dL — ABNORMAL HIGH (ref 70–99)
Glucose-Capillary: 254 mg/dL — ABNORMAL HIGH (ref 70–99)
Glucose-Capillary: 256 mg/dL — ABNORMAL HIGH (ref 70–99)
Glucose-Capillary: 229 mg/dL — ABNORMAL HIGH (ref 70–99)
Glucose-Capillary: 189 mg/dL — ABNORMAL HIGH (ref 70–99)
Glucose-Capillary: 167 mg/dL — ABNORMAL HIGH (ref 70–99)
Glucose-Capillary: 153 mg/dL — ABNORMAL HIGH (ref 70–99)
Glucose-Capillary: 155 mg/dL — ABNORMAL HIGH (ref 70–99)
Glucose-Capillary: 151 mg/dL — ABNORMAL HIGH (ref 70–99)
Glucose-Capillary: 128 mg/dL — ABNORMAL HIGH (ref 70–99)

## 2014-02-03 LAB — CBC WITH DIFFERENTIAL/PLATELET
Basophils Absolute: 0.1 10*3/uL (ref 0.0–0.1)
Basophils Relative: 1 % (ref 0–1)
Eosinophils Absolute: 0.8 10*3/uL — ABNORMAL HIGH (ref 0.0–0.7)
Eosinophils Relative: 7 % — ABNORMAL HIGH (ref 0–5)
HCT: 41.1 % (ref 36.0–46.0)
Hemoglobin: 13.9 g/dL (ref 12.0–15.0)
Lymphocytes Relative: 22 % (ref 12–46)
Lymphs Abs: 2.4 10*3/uL (ref 0.7–4.0)
MCH: 28.2 pg (ref 26.0–34.0)
MCHC: 33.8 g/dL (ref 30.0–36.0)
MCV: 83.4 fL (ref 78.0–100.0)
Monocytes Absolute: 0.9 10*3/uL (ref 0.1–1.0)
Monocytes Relative: 9 % (ref 3–12)
Neutro Abs: 6.5 10*3/uL (ref 1.7–7.7)
Neutrophils Relative %: 61 % (ref 43–77)
Platelets: 307 10*3/uL (ref 150–400)
RBC: 4.93 MIL/uL (ref 3.87–5.11)
RDW: 15 % (ref 11.5–15.5)
WBC: 10.6 10*3/uL — ABNORMAL HIGH (ref 4.0–10.5)

## 2014-02-03 LAB — BASIC METABOLIC PANEL
Anion gap: 18 — ABNORMAL HIGH (ref 5–15)
Anion gap: 19 — ABNORMAL HIGH (ref 5–15)
Anion gap: 18 — ABNORMAL HIGH (ref 5–15)
Anion gap: 18 — ABNORMAL HIGH (ref 5–15)
BUN: 7 mg/dL (ref 6–23)
BUN: 7 mg/dL (ref 6–23)
BUN: 6 mg/dL (ref 6–23)
BUN: 6 mg/dL (ref 6–23)
CO2: 19 mEq/L (ref 19–32)
CO2: 20 mEq/L (ref 19–32)
CO2: 21 mEq/L (ref 19–32)
CO2: 20 mEq/L (ref 19–32)
Calcium: 8.8 mg/dL (ref 8.4–10.5)
Calcium: 9.3 mg/dL (ref 8.4–10.5)
Calcium: 9.2 mg/dL (ref 8.4–10.5)
Calcium: 9 mg/dL (ref 8.4–10.5)
Chloride: 102 mEq/L (ref 96–112)
Chloride: 101 mEq/L (ref 96–112)
Chloride: 101 mEq/L (ref 96–112)
Chloride: 100 mEq/L (ref 96–112)
Creatinine, Ser: 0.49 mg/dL — ABNORMAL LOW (ref 0.50–1.10)
Creatinine, Ser: 0.47 mg/dL — ABNORMAL LOW (ref 0.50–1.10)
Creatinine, Ser: 0.45 mg/dL — ABNORMAL LOW (ref 0.50–1.10)
Creatinine, Ser: 0.46 mg/dL — ABNORMAL LOW (ref 0.50–1.10)
GFR calc Af Amer: >90 mL/min (ref >90)
GFR calc Af Amer: >90 mL/min (ref >90)
GFR calc Af Amer: >90 mL/min (ref >90)
GFR calc Af Amer: >90 mL/min (ref >90)
GFR calc non Af Amer: >90 mL/min (ref >90)
GFR calc non Af Amer: >90 mL/min (ref >90)
GFR calc non Af Amer: >90 mL/min (ref >90)
GFR calc non Af Amer: >90 mL/min (ref >90)
Glucose, Bld: 295 mg/dL — ABNORMAL HIGH (ref 70–99)
Glucose, Bld: 292 mg/dL — ABNORMAL HIGH (ref 70–99)
Glucose, Bld: 194 mg/dL — ABNORMAL HIGH (ref 70–99)
Glucose, Bld: 148 mg/dL — ABNORMAL HIGH (ref 70–99)
Potassium: 4.1 mEq/L (ref 3.7–5.3)
Potassium: 4.4 mEq/L (ref 3.7–5.3)
Potassium: 4 mEq/L (ref 3.7–5.3)
Potassium: 3.9 mEq/L (ref 3.7–5.3)
Sodium: 139 mEq/L (ref 137–147)
Sodium: 140 mEq/L (ref 137–147)
Sodium: 140 mEq/L (ref 137–147)
Sodium: 138 mEq/L (ref 137–147)

## 2014-02-03 LAB — MRSA PCR SCREENING: MRSA by PCR: NEGATIVE

## 2014-02-03 LAB — COMPREHENSIVE METABOLIC PANEL
ALT: 48 U/L — ABNORMAL HIGH (ref 0–35)
AST: 38 U/L — ABNORMAL HIGH (ref 0–37)
Albumin: 4.5 g/dL (ref 3.5–5.2)
Alkaline Phosphatase: 119 U/L — ABNORMAL HIGH (ref 39–117)
Anion gap: 20 — ABNORMAL HIGH (ref 5–15)
BUN: 7 mg/dL (ref 6–23)
CO2: 22 mEq/L (ref 19–32)
Calcium: 9.6 mg/dL (ref 8.4–10.5)
Chloride: 95 mEq/L — ABNORMAL LOW (ref 96–112)
Creatinine, Ser: 0.54 mg/dL (ref 0.50–1.10)
GFR calc Af Amer: >90 mL/min (ref >90)
GFR calc non Af Amer: >90 mL/min (ref >90)
Glucose, Bld: 359 mg/dL — ABNORMAL HIGH (ref 70–99)
Potassium: 4.3 mEq/L (ref 3.7–5.3)
Sodium: 137 mEq/L (ref 137–147)
Total Bilirubin: 1.5 mg/dL — ABNORMAL HIGH (ref 0.3–1.2)
Total Protein: 8.4 g/dL — ABNORMAL HIGH (ref 6.0–8.3)

## 2014-02-03 LAB — URINE MICROSCOPIC-ADD ON

## 2014-02-03 LAB — CBC
HCT: 37.9 % (ref 36.0–46.0)
Hemoglobin: 12.8 g/dL (ref 12.0–15.0)
MCH: 28.4 pg (ref 26.0–34.0)
MCHC: 33.8 g/dL (ref 30.0–36.0)
MCV: 84.2 fL (ref 78.0–100.0)
Platelets: 272 10*3/uL (ref 150–400)
RBC: 4.5 MIL/uL (ref 3.87–5.11)
RDW: 15.2 % (ref 11.5–15.5)
WBC: 9.1 10*3/uL (ref 4.0–10.5)

## 2014-02-03 LAB — CBG MONITORING, ED: Glucose-Capillary: 338 mg/dL — ABNORMAL HIGH (ref 70–99)

## 2014-02-03 LAB — HEMOGLOBIN A1C
Hgb A1c MFr Bld: 8.3 % — ABNORMAL HIGH (ref <5.7)
Mean Plasma Glucose: 192 mg/dL — ABNORMAL HIGH (ref <117)

## 2014-02-03 LAB — TSH: TSH: 0.702 u[IU]/mL (ref 0.350–4.500)

## 2014-02-03 MED ORDER — SODIUM CHLORIDE 0.9 % IV SOLN
INTRAVENOUS | Status: DC
  Administered 2014-02-03: 06:00:00 via INTRAVENOUS

## 2014-02-03 MED ORDER — ENOXAPARIN SODIUM 40 MG/0.4ML ~~LOC~~ SOLN
40.0000 mg | Freq: Every day | SUBCUTANEOUS | Status: DC
  Administered 2014-02-03 – 2014-02-05 (×3): 40 mg via SUBCUTANEOUS
  Filled 2014-02-03 (×3): qty 0.4

## 2014-02-03 MED ORDER — ALBUTEROL (5 MG/ML) CONTINUOUS INHALATION SOLN
10.0000 mg/h | INHALATION_SOLUTION | RESPIRATORY_TRACT | Status: DC
  Administered 2014-02-03: 10 mg/h via RESPIRATORY_TRACT
  Filled 2014-02-03: qty 20

## 2014-02-03 MED ORDER — PROMETHAZINE HCL 25 MG PO TABS
25.0000 mg | ORAL_TABLET | Freq: Four times a day (QID) | ORAL | Status: DC | PRN
  Administered 2014-02-03: 25 mg via ORAL
  Filled 2014-02-03: qty 1

## 2014-02-03 MED ORDER — ONDANSETRON HCL 4 MG/2ML IJ SOLN
4.0000 mg | Freq: Four times a day (QID) | INTRAMUSCULAR | Status: DC | PRN
  Administered 2014-02-03: 4 mg via INTRAVENOUS
  Filled 2014-02-03: qty 2

## 2014-02-03 MED ORDER — ALBUTEROL SULFATE (2.5 MG/3ML) 0.083% IN NEBU
10.0000 mg | INHALATION_SOLUTION | Freq: Once | RESPIRATORY_TRACT | Status: DC
  Administered 2014-02-03: 10 mg via RESPIRATORY_TRACT
  Filled 2014-02-03: qty 12

## 2014-02-03 MED ORDER — ALBUTEROL SULFATE (2.5 MG/3ML) 0.083% IN NEBU
5.0000 mg | INHALATION_SOLUTION | Freq: Once | RESPIRATORY_TRACT | Status: AC
  Administered 2014-02-03: 5 mg via RESPIRATORY_TRACT
  Filled 2014-02-03: qty 6

## 2014-02-03 MED ORDER — DEXTROSE-NACL 5-0.45 % IV SOLN
INTRAVENOUS | Status: DC
  Administered 2014-02-03 – 2014-02-04 (×2): via INTRAVENOUS

## 2014-02-03 MED ORDER — DIAZEPAM 5 MG PO TABS: 5.0000 mg | ORAL_TABLET | Freq: Every evening | ORAL | Status: DC | PRN

## 2014-02-03 MED ORDER — SODIUM CHLORIDE 0.9 % IV BOLUS (SEPSIS)
1000.0000 mL | Freq: Once | INTRAVENOUS | Status: AC
  Administered 2014-02-03: 1000 mL via INTRAVENOUS

## 2014-02-03 MED ORDER — MOMETASONE FURO-FORMOTEROL FUM 100-5 MCG/ACT IN AERO
2.0000 | INHALATION_SPRAY | Freq: Two times a day (BID) | RESPIRATORY_TRACT | Status: DC
  Administered 2014-02-03 – 2014-02-05 (×5): 2 via RESPIRATORY_TRACT
  Filled 2014-02-03: qty 8.8

## 2014-02-03 MED ORDER — TRAMADOL HCL 50 MG PO TABS
50.0000 mg | ORAL_TABLET | Freq: Two times a day (BID) | ORAL | Status: DC | PRN
  Administered 2014-02-04 – 2014-02-05 (×2): 50 mg via ORAL
  Filled 2014-02-03 (×2): qty 1

## 2014-02-03 MED ORDER — DILTIAZEM HCL ER COATED BEADS 240 MG PO CP24
240.0000 mg | ORAL_CAPSULE | Freq: Every day | ORAL | Status: DC
  Administered 2014-02-03 – 2014-02-05 (×3): 240 mg via ORAL
  Filled 2014-02-03 (×3): qty 1

## 2014-02-03 MED ORDER — PANTOPRAZOLE SODIUM 40 MG PO TBEC
80.0000 mg | DELAYED_RELEASE_TABLET | Freq: Every day | ORAL | Status: DC
Start: 2014-02-03 — End: 2014-02-04
  Administered 2014-02-03: 80 mg via ORAL
  Filled 2014-02-03: qty 2

## 2014-02-03 MED ORDER — DEXTROSE 50 % IV SOLN: 25.0000 mL | INTRAVENOUS | Status: DC | PRN

## 2014-02-03 MED ORDER — CLONAZEPAM 0.5 MG PO TABS
0.5000 mg | ORAL_TABLET | Freq: Every day | ORAL | Status: DC
  Administered 2014-02-03 – 2014-02-04 (×2): 0.5 mg via ORAL
  Filled 2014-02-03 (×2): qty 1

## 2014-02-03 MED ORDER — TIZANIDINE HCL 4 MG PO TABS
4.0000 mg | ORAL_TABLET | Freq: Three times a day (TID) | ORAL | Status: DC | PRN
  Filled 2014-02-03: qty 1

## 2014-02-03 MED ORDER — ATORVASTATIN CALCIUM 10 MG PO TABS
10.0000 mg | ORAL_TABLET | Freq: Every evening | ORAL | Status: DC
  Administered 2014-02-03 – 2014-02-04 (×2): 10 mg via ORAL
  Filled 2014-02-03 (×3): qty 1

## 2014-02-03 MED ORDER — CEFUROXIME AXETIL 500 MG PO TABS
500.0000 mg | ORAL_TABLET | Freq: Two times a day (BID) | ORAL | Status: DC
  Administered 2014-02-03 – 2014-02-05 (×5): 500 mg via ORAL
  Filled 2014-02-03 (×6): qty 1

## 2014-02-03 MED ORDER — PREDNISONE 20 MG PO TABS
60.0000 mg | ORAL_TABLET | Freq: Once | ORAL | Status: AC
  Administered 2014-02-03: 60 mg via ORAL
  Filled 2014-02-03: qty 3

## 2014-02-03 MED ORDER — LEVOTHYROXINE SODIUM 100 MCG PO TABS
100.0000 ug | ORAL_TABLET | Freq: Every day | ORAL | Status: DC
  Administered 2014-02-03 – 2014-02-05 (×3): 100 ug via ORAL
  Filled 2014-02-03 (×4): qty 1

## 2014-02-03 MED ORDER — SODIUM CHLORIDE 0.9 % IV SOLN
INTRAVENOUS | Status: DC
  Administered 2014-02-03: 2.8 [IU]/h via INTRAVENOUS
  Administered 2014-02-04: 6.6 [IU]/h via INTRAVENOUS
  Filled 2014-02-03 (×3): qty 1

## 2014-02-03 MED ORDER — AMITRIPTYLINE HCL 50 MG PO TABS
50.0000 mg | ORAL_TABLET | Freq: Every day | ORAL | Status: DC
  Administered 2014-02-03 – 2014-02-04 (×2): 50 mg via ORAL
  Filled 2014-02-03 (×3): qty 1

## 2014-02-03 MED ORDER — NEBIVOLOL HCL 2.5 MG PO TABS
2.5000 mg | ORAL_TABLET | Freq: Every day | ORAL | Status: DC
  Administered 2014-02-03 – 2014-02-05 (×3): 2.5 mg via ORAL
  Filled 2014-02-03 (×3): qty 1

## 2014-02-03 MED ORDER — ALBUTEROL SULFATE (2.5 MG/3ML) 0.083% IN NEBU: 2.5000 mg | INHALATION_SOLUTION | Freq: Four times a day (QID) | RESPIRATORY_TRACT | Status: DC | PRN

## 2014-02-03 MED ORDER — ALBUTEROL SULFATE (2.5 MG/3ML) 0.083% IN NEBU
2.5000 mg | INHALATION_SOLUTION | Freq: Four times a day (QID) | RESPIRATORY_TRACT | Status: DC | PRN
  Administered 2014-02-03: 2.5 mg via RESPIRATORY_TRACT
  Filled 2014-02-03: qty 3

## 2014-02-03 MED ORDER — INSULIN REGULAR BOLUS VIA INFUSION
0.0000 [IU] | Freq: Three times a day (TID) | INTRAVENOUS | Status: DC
  Administered 2014-02-03: 0.2 [IU] via INTRAVENOUS
  Administered 2014-02-03: 6.9 [IU] via INTRAVENOUS
  Administered 2014-02-04: 4.3 [IU] via INTRAVENOUS
  Administered 2014-02-04: 3.5 [IU] via INTRAVENOUS
  Filled 2014-02-03: qty 10

## 2014-02-03 MED ORDER — GABAPENTIN 400 MG PO CAPS
800.0000 mg | ORAL_CAPSULE | Freq: Three times a day (TID) | ORAL | Status: DC
  Administered 2014-02-03 – 2014-02-05 (×7): 800 mg via ORAL
  Filled 2014-02-03 (×9): qty 2

## 2014-02-03 MED ORDER — IPRATROPIUM-ALBUTEROL 0.5-2.5 (3) MG/3ML IN SOLN
3.0000 mL | RESPIRATORY_TRACT | Status: DC | PRN
  Administered 2014-02-03 – 2014-02-04 (×2): 3 mL via RESPIRATORY_TRACT
  Filled 2014-02-03 (×3): qty 3

## 2014-02-03 MED ORDER — BIOTENE DRY MOUTH MT LIQD
15.0000 mL | Freq: Two times a day (BID) | OROMUCOSAL | Status: DC
  Administered 2014-02-03 – 2014-02-05 (×5): 15 mL via OROMUCOSAL

## 2014-02-03 MED ORDER — ONDANSETRON HCL 4 MG PO TABS: 4.0000 mg | ORAL_TABLET | Freq: Four times a day (QID) | ORAL | Status: DC | PRN

## 2014-02-03 MED ORDER — INSULIN REGULAR HUMAN 100 UNIT/ML IJ SOLN
INTRAMUSCULAR | Status: DC
  Administered 2014-02-04: 3.8 [IU]/h via INTRAVENOUS
  Filled 2014-02-03: qty 1

## 2014-02-03 NOTE — ED Notes (Signed)
POC CBG is 338

## 2014-02-03 NOTE — Progress Notes (Signed)
Called to [facility] at 0445 per floor RN. Pt just arrived to [facility] on continuous nebulizer. Floor RN unsure if pt is appropriate for care on [place], or SD unit. Prior notes from EDP showed communication to [facility]  MD for admission to SDU. Dr. Eliseo Squires paged, call back received and MD updated on pt status and current Nebulizer treatment which requires a SD bed. Orders placed for SD bed. Bed placement notified and new bed obtained on [place]. ED RN to give report to [place]. Pt left resting in bed, continuous neb just completed on [facility].

## 2014-02-03 NOTE — ED Provider Notes (Addendum)
CSN: [id number]     Arrival date & time 02/02/14  2302 History   First MD Initiated Contact with Patient 02/03/14 0121     Chief Complaint  Patient presents with  . Hyperglycemia  . Shortness of Breath     (Consider location/radiation/quality/duration/timing/severity/associated sxs/prior Treatment) HPI  Patient is a 48 yo woman with IDDM and COPD along with chronic sinus tachycardia and several other co-morbidities.  She has had several days of a productive cough with wheezing and SOB. She is currently prescribed Cefdinir for treatment of respiratory infection. She began to feel worse this evening and noticed her BG was climbing into the 300s. This prompted her to call her PCP who advised her to come to the ED.   The patient has had a subjective fever. She reports compliance with insulin. However, she says her by mouth intake has been mildly diminished. She has been urinating more than normal. She checked her urine for ketones at home and it was positive. She has had DKA in the past. She does not have chest pain, abdominal pain, nausea or vomiting.   She endorses general fatigue and malaise.  Past Medical History  Diagnosis Date  . GERD (gastroesophageal reflux disease)   . Chronic airway obstruction, not elsewhere classified   . Asthma   . Hyperlipidemia   . Hypothyroidism   . Depression   . Gastroparesis     from DM and chronic narcotic use  . DDD (degenerative disc disease)     CERVIAL AND LUMBAR  . Edema   . Human parvovirus infection   . 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 Fatty liver disease, nonalcoholic   . Positive PPD   . Vitamin D deficiency   . Vocal cord dysfunction   . PONV (postoperative nausea and vomiting)   . Hypertension   . Shortness of breath     "at anytime; it's gotten worse recently" (02/09/2013)  . OSA (obstructive sleep apnea)     "have mask;; don't use it; no one came out to check it" (02/09/2013)   . Type II diabetes mellitus   . Fibromyalgia     "severe" (02/09/2013)  . Peripheral neuropathy     "severe" (02/09/2013)  . Migraine headache     "weekly; worse lately" (02/09/2013)  . Chronic pain     "q where; herniated disc in my tailbone" (02/09/2013)  . Anxiety   . Interstitial cystitis    Past Surgical History  Procedure Laterality Date  . Abdominal hysterectomy  1993  . Cholecystectomy  ?1990  . Nasal sinus surgery  1986; 1990's    "i've had 3" (02/09/2013)  . Knee arthroscopy Left 1990's    "fell; clot behind knee cap had to be removed" (02/09/2013)  . Hip surgery Right 2002    "did something to the tibula band" (02/09/2013)  . Shoulder arthroscopy w/ rotator cuff repair Right     "had to go deep in rotator cuff" (02/09/2013)  . Anterior cervical decomp/discectomy fusion  2004  . Carpal tunnel release Bilateral ?1980's   Family History  Problem Relation Age of Onset  . Coronary artery disease Father    History  Substance Use Topics  . Smoking status: Former Smoker -- 0.10 packs/day for 3 years    Types: Cigarettes    Quit date: 07/28/2005  . Smokeless tobacco: Never Used  . Alcohol Use: No   OB History   Grav Para Term Preterm  Abortions TAB SAB Ect Mult Living                 Review of Systems Ten point review of symptoms performed and is negative with the exception of symptoms noted above.    Allergies  Influenza vaccines; Levofloxacin; Sulfonamide derivatives; Biaxin; Nucynta; Vilazodone hcl; Amoxicillin; Carbamazepine; Ciprofloxacin; Iohexol; Pregabalin; and Percocet  Home Medications   Prior to Admission medications   Medication Sig Start Date End Date Taking? Authorizing Provider  albuterol (PROVENTIL HFA;VENTOLIN HFA) 108 (90 BASE) MCG/ACT inhaler Inhale 2 puffs into the lungs every 6 (six) hours as needed for wheezing or shortness of breath.   Yes Historical Provider, MD  albuterol (PROVENTIL) (2.5 MG/3ML) 0.083% nebulizer solution Take 2.5 mg by  nebulization every 6 (six) hours as needed. For wheezing 04/30/12  Yes Deneise Lever, MD  amitriptyline (ELAVIL) 50 MG tablet Take 50 mg by mouth at bedtime.   Yes Historical Provider, MD  aspirin-acetaminophen-caffeine (EXCEDRIN MIGRAINE) [phone] MG per tablet Take 1 tablet by mouth every 6 (six) hours as needed for pain.   Yes Historical Provider, MD  atorvastatin (LIPITOR) 10 MG tablet Take 10 mg by mouth every evening.   Yes Historical Provider, MD  cefdinir (OMNICEF) 300 MG capsule Take 300 mg by mouth 2 (two) times daily. For 7 days   Yes Historical Provider, MD  clonazePAM (KLONOPIN) 0.5 MG tablet Take 0.5 mg by mouth at bedtime.   Yes Historical Provider, MD  diazepam (VALIUM) 5 MG tablet Take 5 mg by mouth at bedtime as needed for anxiety.   Yes Historical Provider, MD  diltiazem (CARDIZEM CD) 240 MG 24 hr capsule Take 240 mg by mouth daily.     Yes Historical Provider, MD  gabapentin (NEURONTIN) 800 MG tablet Take 800 mg by mouth 3 (three) times daily.    Yes Historical Provider, MD  insulin regular human CONCENTRATED (HUMULIN R) 500 UNIT/ML SOLN injection Inject 100 Units into the skin 3 (three) times daily with meals.    Yes Historical Provider, MD  ipratropium-albuterol (DUONEB) 0.5-2.5 (3) MG/3ML SOLN Take 3 mLs by nebulization every 2 (two) hours as needed (shortness of breath).   Yes Historical Provider, MD  levothyroxine (SYNTHROID, LEVOTHROID) 100 MCG tablet Take 100 mcg by mouth daily before breakfast.   Yes Historical Provider, MD  mometasone-formoterol (DULERA) 100-5 MCG/ACT AERO Inhale 2 puffs into the lungs 2 (two) times daily. 05/04/13  Yes Tammy S Parrett, NP  nebivolol (BYSTOLIC) 5 MG tablet Take 2.5 mg by mouth daily.    Yes Historical Provider, MD  omeprazole (PRILOSEC) 40 MG capsule Take 40 mg by mouth daily.    Yes Historical Provider, MD  promethazine (PHENERGAN) 25 MG tablet Take 25 mg by mouth every 6 (six) hours as needed for nausea or vomiting.   Yes Historical  Provider, MD  tiZANidine (ZANAFLEX) 4 MG capsule Take 4 mg by mouth 3 (three) times daily as needed. For muscle spasms   Yes Historical Provider, MD  traMADol (ULTRAM) 50 MG tablet Take 50 mg by mouth every 12 (twelve) hours as needed for moderate pain.   Yes Historical Provider, MD  Vitamin D, Ergocalciferol, (DRISDOL) 50000 UNITS CAPS Take 50,000 Units by mouth every 7 (seven) days. Takes on Wed   Yes Historical Provider, MD   BP 136/67  Pulse 120  Temp(Src) 98.9 F (37.2 C) (Oral)  Resp 21  Ht 5' 2"  (1.575 m)  Wt 221 lb (100.245 kg)  BMI 40.41 kg/m2  SpO2 94% Physical Exam  Gen: well developed and well nourished appearing, ill appearing Head: NCAT Eyes: PERL, EOMI Nose: no epistaixis or rhinorrhea Mouth/throat: mucosa is moist and pink Neck: supple, no stridor Lungs: RR 28/min, scattered wheezing with course bibasilar breath sounds CV: Rap[id and regular, pulse 130 bpm, no murmur, extremities appear well perfused.  Abd: soft, obese,  notender, nondistended Back: no ttp, no cva ttp Skin: warm and dry Ext: normal to inspection, no dependent edema Neuro: CN ii-xii grossly intact, no focal deficits Psyche; normal affect,  calm and cooperative.   ED Course  Procedures (including critical care time) Labs Review Labs Reviewed  CBC WITH DIFFERENTIAL - Abnormal; Notable for the following:    WBC 10.6 (*)    Eosinophils Relative 7 (*)    Eosinophils Absolute 0.8 (*)    All other components within normal limits  COMPREHENSIVE METABOLIC PANEL - Abnormal; Notable for the following:    Chloride 95 (*)    Glucose, Bld 359 (*)    Total Protein 8.4 (*)    AST 38 (*)    ALT 48 (*)    Alkaline Phosphatase 119 (*)    Total Bilirubin 1.5 (*)    Anion gap 20 (*)    All other components within normal limits  URINALYSIS, ROUTINE W REFLEX MICROSCOPIC - Abnormal; Notable for the following:    Color, Urine AMBER (*)    Specific Gravity, Urine 1.038 (*)    Glucose, UA >1000 (*)     Ketones, ur >80 (*)    All other components within normal limits  URINE MICROSCOPIC-ADD ON - Abnormal; Notable for the following:    Squamous Epithelial / LPF FEW (*)    Casts HYALINE CASTS (*)    All other components within normal limits  CBG MONITORING, ED - Abnormal; Notable for the following:    Glucose-Capillary 348 (*)    All other components within normal limits    Imaging Review Dg Chest 2 View (if Patient Has Fever And/or Copd)  02/03/2014   CLINICAL DATA:  Shortness of breath.  EXAM: CHEST  2 VIEW  COMPARISON:  02/02/2014, from [facility].  FINDINGS: The heart size and mediastinal contours are within normal limits. Both lungs are clear. The visualized skeletal structures are unremarkable.  Postoperative changes in the lower cervical spine.  IMPRESSION: No active cardiopulmonary disease.   Electronically Signed   By: Lucienne Capers M.D.   On: 02/03/2014 01:53    EKG: Sinus tachycardia, no acute ischemic changes, normal intervals, normal axis, normal qrs complex  CRITICAL CARE Performed by: Elyn Peers   Total critical care time: 39m Critical care time was exclusive of separately billable procedures and treating other patients.  Critical care was necessary to treat or prevent imminent or life-threatening deterioration.  Critical care was time spent personally by me on the following activities: development of treatment plan with patient and/or surrogate as well as nursing, discussions with consultants, evaluation of patient's response to treatment, examination of patient, obtaining history from patient or surrogate, ordering and performing treatments and interventions, ordering and review of laboratory studies, ordering and review of radiographic studies, pulse oximetry and re-evaluation of patient's condition.   MDM   Patient with acute copd excacerbation, bronchitis and early DKA.  We are treating with IVF, IV insulin, Albuterol nebs and solumedrol.  Hospitalist paged to request admission to SDU.     JElyn Peers MD 02/03/14 0320  Case discussed with Dr. VEliseo Squireswho  has accepted the patient to the SDU. I will write holding orders.   Elyn Peers, MD 02/03/14 [phone]

## 2014-02-03 NOTE — ED Notes (Signed)
Clarified with Dr. Cheri Guppy that patient is to go to telemetry floor if they have capability to monitor insulin drips. Jarrett Soho, receiving RN confirms that they can manage insulin drips there.

## 2014-02-03 NOTE — Progress Notes (Signed)
Received patient from [place] to 2C06 via wheelchair. Patient A&Ox4, VS stable. Oriented to room and call bell placed within reach. Family present at bedside. Got report from [place].

## 2014-02-03 NOTE — H&P (Signed)
[facility] History and Physical  Cheryl [name] [id number]:[id number] DOB: [birth year] DOA: 02/02/2014  Referring physician: er PCP: Abigail Miyamoto, MD   Chief Complaint: elevated blood sugar  HPI: Cheryl [name] is a 48 y.o. female  Who was sent by her PCP for productive cough/SOB and elevated blood sugars,   She was given cefdinir for her respiratory symptoms by her PCP but she continue to worsen.  She said her blood sugars were 300s and she had ketones in her urine with a home test.   +fevers, not eating well or diabetic foods.   No CP, no N/V/D  In the ER, her blood sugar was elevated and she had a gap of 20-- she was started on an insulin gtt.  She was also given albuterol, prednisone and IVF.  Hospitalist were called for admission   Review of Systems:  All systems reviewed, negative unless stated above   Past Medical History  Diagnosis Date  . GERD (gastroesophageal reflux disease)   . Chronic airway obstruction, not elsewhere classified   . Asthma   . Hyperlipidemia   . Hypothyroidism   . Depression   . Gastroparesis     from DM and chronic narcotic use  . DDD (degenerative disc disease)     CERVIAL AND LUMBAR  . Edema   . Human parvovirus infection   . 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 Fatty liver disease, nonalcoholic   . Positive PPD   . Vitamin D deficiency   . Vocal cord dysfunction   . PONV (postoperative nausea and vomiting)   . Hypertension   . Shortness of breath     "at anytime; it's gotten worse recently" (02/09/2013)  . OSA (obstructive sleep apnea)     "have mask;; don't use it; no one came out to check it" (02/09/2013)  . Type II diabetes mellitus   . Fibromyalgia     "severe" (02/09/2013)  . Peripheral neuropathy     "severe" (02/09/2013)  . Migraine headache     "weekly; worse lately" (02/09/2013)  . Chronic pain     "q where; herniated disc in my tailbone" (02/09/2013)  . Anxiety   .  Interstitial cystitis    Past Surgical History  Procedure Laterality Date  . Abdominal hysterectomy  1993  . Cholecystectomy  ?1990  . Nasal sinus surgery  1986; 1990's    "i've had 3" (02/09/2013)  . Knee arthroscopy Left 1990's    "fell; clot behind knee cap had to be removed" (02/09/2013)  . Hip surgery Right 2002    "did something to the tibula band" (02/09/2013)  . Shoulder arthroscopy w/ rotator cuff repair Right     "had to go deep in rotator cuff" (02/09/2013)  . Anterior cervical decomp/discectomy fusion  2004  . Carpal tunnel release Bilateral ?1980's   Social History:  reports that she quit smoking about 8 years ago. Her smoking use included Cigarettes. She has a .3 pack-year smoking history. She has never used smokeless tobacco. She reports that she does not drink alcohol or use illicit drugs.  Allergies  Allergen Reactions  . Influenza Vaccines     Throat swelling, "flu" symptoms  . Levofloxacin Anaphylaxis and Hives  . Sulfonamide Derivatives Anaphylaxis  . Biaxin [Clarithromycin] Hives  . Nucynta [Tapentadol Hydrochloride] Other (See Comments)    Halucinations insomnia x3 days  . Vilazodone Hcl Other (See Comments)    Hallucinations  . Amoxicillin  Hives  . Carbamazepine Itching  . Ciprofloxacin Hives  . Iohexol      Desc: hives,dyspnea; throat swelling; ok w/ premeds and omnipaque   . Pregabalin Other (See Comments)    Reaction-Syncope & unable to speak  . Percocet [Oxycodone-Acetaminophen] Rash    Family History  Problem Relation Age of Onset  . Coronary artery disease Father      Prior to Admission medications   Medication Sig Start Date End Date Taking? Authorizing Provider  albuterol (PROVENTIL HFA;VENTOLIN HFA) 108 (90 BASE) MCG/ACT inhaler Inhale 2 puffs into the lungs every 6 (six) hours as needed for wheezing or shortness of breath.   Yes Historical Provider, MD  albuterol (PROVENTIL) (2.5 MG/3ML) 0.083% nebulizer solution Take 2.5 mg by  nebulization every 6 (six) hours as needed. For wheezing 04/30/12  Yes Deneise Lever, MD  amitriptyline (ELAVIL) 50 MG tablet Take 50 mg by mouth at bedtime.   Yes Historical Provider, MD  aspirin-acetaminophen-caffeine (EXCEDRIN MIGRAINE) [phone] MG per tablet Take 1 tablet by mouth every 6 (six) hours as needed for pain.   Yes Historical Provider, MD  atorvastatin (LIPITOR) 10 MG tablet Take 10 mg by mouth every evening.   Yes Historical Provider, MD  cefdinir (OMNICEF) 300 MG capsule Take 300 mg by mouth 2 (two) times daily. For 7 days   Yes Historical Provider, MD  clonazePAM (KLONOPIN) 0.5 MG tablet Take 0.5 mg by mouth at bedtime.   Yes Historical Provider, MD  diazepam (VALIUM) 5 MG tablet Take 5 mg by mouth at bedtime as needed for anxiety.   Yes Historical Provider, MD  diltiazem (CARDIZEM CD) 240 MG 24 hr capsule Take 240 mg by mouth daily.     Yes Historical Provider, MD  gabapentin (NEURONTIN) 800 MG tablet Take 800 mg by mouth 3 (three) times daily.    Yes Historical Provider, MD  insulin regular human CONCENTRATED (HUMULIN R) 500 UNIT/ML SOLN injection Inject 100 Units into the skin 3 (three) times daily with meals.    Yes Historical Provider, MD  ipratropium-albuterol (DUONEB) 0.5-2.5 (3) MG/3ML SOLN Take 3 mLs by nebulization every 2 (two) hours as needed (shortness of breath).   Yes Historical Provider, MD  levothyroxine (SYNTHROID, LEVOTHROID) 100 MCG tablet Take 100 mcg by mouth daily before breakfast.   Yes Historical Provider, MD  mometasone-formoterol (DULERA) 100-5 MCG/ACT AERO Inhale 2 puffs into the lungs 2 (two) times daily. 05/04/13  Yes Tammy S Parrett, NP  nebivolol (BYSTOLIC) 5 MG tablet Take 2.5 mg by mouth daily.    Yes Historical Provider, MD  omeprazole (PRILOSEC) 40 MG capsule Take 40 mg by mouth daily.    Yes Historical Provider, MD  promethazine (PHENERGAN) 25 MG tablet Take 25 mg by mouth every 6 (six) hours as needed for nausea or vomiting.   Yes Historical  Provider, MD  tiZANidine (ZANAFLEX) 4 MG capsule Take 4 mg by mouth 3 (three) times daily as needed. For muscle spasms   Yes Historical Provider, MD  traMADol (ULTRAM) 50 MG tablet Take 50 mg by mouth every 12 (twelve) hours as needed for moderate pain.   Yes Historical Provider, MD  Vitamin D, Ergocalciferol, (DRISDOL) 50000 UNITS CAPS Take 50,000 Units by mouth every 7 (seven) days. Takes on Wed   Yes Historical Provider, MD   Physical Exam: Filed Vitals:   02/03/14 0415  BP: 113/45  Pulse: 125  Temp:   Resp: 21    BP 113/45  Pulse 125  Temp(Src) 98.9  F (37.2 C) (Oral)  Resp 21  Ht 5' 2"  (1.575 m)  Wt 100.245 kg (221 lb)  BMI 40.41 kg/m2  SpO2 97%  General:  Getting nebulizer, tearful- worried about her daughter Eyes: PERRL, normal lids, irises & conjunctiva ENT: grossly normal hearing, lips & tongue Neck: no LAD, masses or thyromegaly Cardiovascular: tachy, no m/r/g. No LE edema. Telemetry: SR, no arrhythmias  Respiratory: decreased but no active wheezing Abdomen: soft, ntnd- obese Skin: no rash or induration seen on limited exam Musculoskeletal: grossly normal tone BUE/BLE Psychiatric: tearful Neurologic: grossly non-focal.          Labs on Admission:  Basic Metabolic Panel:  Recent Labs Lab 02/02/14 2355  NA 137  K 4.3  CL 95*  CO2 22  GLUCOSE 359*  BUN 7  CREATININE 0.54  CALCIUM 9.6   Liver Function Tests:  Recent Labs Lab 02/02/14 2355  AST 38*  ALT 48*  ALKPHOS 119*  BILITOT 1.5*  PROT 8.4*  ALBUMIN 4.5   No results found for this basename: LIPASE, AMYLASE,  in the last 168 hours No results found for this basename: AMMONIA,  in the last 168 hours CBC:  Recent Labs Lab 02/02/14 2355  WBC 10.6*  NEUTROABS 6.5  HGB 13.9  HCT 41.1  MCV 83.4  PLT 307   Cardiac Enzymes: No results found for this basename: CKTOTAL, CKMB, CKMBINDEX, TROPONINI,  in the last 168 hours  BNP (last 3 results) No results found for this basename: PROBNP,   in the last 8760 hours CBG:  Recent Labs Lab 02/02/14 2331 02/03/14 0354  GLUCAP 348* 338*    Radiological Exams on Admission: Dg Chest 2 View (if Patient Has Fever And/or Copd)  02/03/2014   CLINICAL DATA:  Shortness of breath.  EXAM: CHEST  2 VIEW  COMPARISON:  02/02/2014, from [place].  FINDINGS: The heart size and mediastinal contours are within normal limits. Both lungs are clear. The visualized skeletal structures are unremarkable.  Postoperative changes in the lower cervical spine.  IMPRESSION: No active cardiopulmonary disease.   Electronically Signed   By: Lucienne Capers M.D.   On: 02/03/2014 01:53    EKG: Independently reviewed. sinus tachy  Assessment/Plan Active Problems:   Essential hypertension, benign   Asthma with bronchitis   Obesity   Type II or unspecified type diabetes mellitus without mention of complication, uncontrolled   DKA (diabetic ketoacidoses)   DKA- IV insulin, will need further diabetic education, check HgbA1C  Asthma with bronchitis- hold on steroids as no wheezing and patient already has hyperglycemia  Obesity  HTN- continue home meds  Sinus tachy- patient says she is always 100-120s -check TSH -continue cardizem  Code Status: full Family Communication: patient Disposition Plan: admit  Time spent: 75 min  Eulogio Bear [facility] Pager [phone]  **Disclaimer: This note may have been dictated with voice recognition software. Similar sounding words can inadvertently be transcribed and this note may contain transcription errors which may not have been corrected upon publication of note.**

## 2014-02-03 NOTE — Progress Notes (Signed)
Spoke with patient to clarify the U-500 insulin dosage she takes at home.   States that she takes 15-20 "units" on her syringe of U-500 R insulin three times per day with meals. This dosage is equivalent to 75-100 units Novolog.  States that she has been having thirst and frequent urination the last few days.  Has bronchitis and was seen by Dr. [name] before being admitted to hospital.  Spoke with Hedy Camara, pharmacist, about the way the home med list is written with U-500 insulin dosage.   He will have that information clarified.  Spoke with Dr. Conley Canal and explained the dosage that patient is on.  Patient will bring in the U-500 insulin from home to have the pharmacy draw up dosages to be given in the hospital.  Patient remains on glucostabilizer at this time. Will continue to follow while in hospital.  Harvel Ricks RN BSN CDE

## 2014-02-03 NOTE — Progress Notes (Signed)
Patient admitted after midnight. Chart reviewed. Patient examined.  BMET from [facility] still pending.  CBG 300. No wheeze. Speaking in quiet child like voice with her parents hovering at bedside.  Reports she walks with walker because of fibromyalgia and chronic fatigue syndrome.  OOB, PT eval.  Doree Barthel, MD. [facility] [phone]

## 2014-02-03 NOTE — ED Notes (Signed)
Attempted to call report

## 2014-02-03 NOTE — ED Notes (Signed)
Rapid response at the bedside coordinating transfer to 2S.  Patient currently receiving continuous neb treatment for wheezing.  Attempted to call report to 2S.

## 2014-02-03 NOTE — Evaluation (Signed)
Physical Therapy Evaluation Patient Details Name: Cheryl [name] MRN: [id number] DOB: 09-Oct-1965 Today's Date: 02/03/2014   History of Present Illness  Pt admitted with respiratory difficulty, ketones in urine and DKA. Pt with hx of fibromyalgia, chronic fatigue syndrome, vocal cord dysfunction  Clinical Impression  Pt very pleasant and soft spoken. Pt reports limited activity tolerance at baseline and only slightly decreased from baseline currently with anticipation of quick return to baseline. Pt reports the dry wall on her ceiling at home is falling down, there is mold and she feels the house is making her sick. She states she hopes to move in August and wanted to know if the hospital would order a home inspection. Pt educated for RW use and transfers with encouragement to continue mobility acutely to maximize independence and return home. Pt with sats 92-95% throughout on RA with HR 120-127 with gait. Pt will benefit from acute therapy to maximize transfers and gait in order to return pt to PLOF. Pt educated to use RW at home instead of furniture walking. Pt also states she tried HHPT before and it just didn't work because if made her too fatigued and she has no interest in therapy outside acute venue.     Follow Up Recommendations No PT follow up    Equipment Recommendations  None recommended by PT    Recommendations for Other Services       Precautions / Restrictions Precautions Precautions: Fall      Mobility  Bed Mobility               General bed mobility comments: pt EOB on arrival but reports difficulty transferring OOB today  Transfers Overall transfer level: Needs assistance   Transfers: Sit to/from Stand;Stand Pivot Transfers Sit to Stand: Supervision Stand pivot transfers: Supervision       General transfer comment: cues for hand placement and safety  Ambulation/Gait Ambulation/Gait assistance: Supervision Ambulation Distance (Feet): 125  Feet Assistive device: Rolling walker (2 wheeled) Gait Pattern/deviations: Step-through pattern;Decreased stride length   Gait velocity interpretation: Below normal speed for age/gender General Gait Details: cues for position in RW and breathing technique to maintain oxygen saturation  Stairs            Wheelchair Mobility    Modified Rankin (Stroke Patients Only)       Balance                                             Pertinent Vitals/Pain No pain    Home Living Family/patient expects to be discharged to:: Private residence Living Arrangements: Children Available Help at Discharge: Family;Available 24 hours/day Type of Home: House Home Access: Stairs to enter Entrance Stairs-Rails: Psychiatric nurse of Steps: 3 Home Layout: One level Home Equipment: Walker - 4 wheels;Shower seat;Grab bars - tub/shower;Hand held shower head Additional Comments: furniture walk in the house, walker outside    Prior Function Level of Independence: Needs assistance   Gait / Transfers Assistance Needed: pt mod I with gait and transfers limited distance to 200' max  ADL's / Homemaking Assistance Needed: performs bathing and dressing on her own dgtr performs cooking and housework        Hand Dominance   Dominant Hand: Right    Extremity/Trunk Assessment   Upper Extremity Assessment: Overall WFL for tasks assessed  Lower Extremity Assessment: Overall WFL for tasks assessed      Cervical / Trunk Assessment: Normal  Communication   Communication: Expressive difficulties  Cognition Arousal/Alertness: Awake/alert Behavior During Therapy: Flat affect Overall Cognitive Status: Within Functional Limits for tasks assessed                      General Comments      Exercises        Assessment/Plan    PT Assessment Patient needs continued PT services  PT Diagnosis Difficulty walking   PT Problem List Decreased  strength;Decreased activity tolerance;Decreased knowledge of use of DME  PT Treatment Interventions Gait training;DME instruction;Stair training;Functional mobility training;Therapeutic activities;Therapeutic exercise;Patient/family education   PT Goals (Current goals can be found in the Care Plan section) Acute Rehab PT Goals Patient Stated Goal: return to home PT Goal Formulation: With patient Time For Goal Achievement: 02/10/14 Potential to Achieve Goals: Good    Frequency Min 3X/week   Barriers to discharge Decreased caregiver support      Co-evaluation               End of Session   Activity Tolerance: Patient tolerated treatment well Patient left: in chair;with call bell/phone within [name] Nurse Communication: Mobility status         Time: 1150-1210 PT Time Calculation (min): 20 min   Charges:   PT Evaluation $Initial PT Evaluation Tier I: 1 Procedure PT Treatments $Therapeutic Activity: 8-22 mins   PT G CodesMelford [name] 02/03/2014, 12:57 PM Cheryl [name], [place]

## 2014-02-03 NOTE — Care Management Note (Signed)
    Page 1 of 1   02/03/2014     8:17:56 AM CARE MANAGEMENT NOTE 02/03/2014  Patient:  Cheryl [name]   Account Number:  [id number]  Date Initiated:  02/03/2014  Documentation initiated by:  Elissa Hefty  Subjective/Objective Assessment:   adm w hyperglycemia     Action/Plan:   lives alone, pcp dr Herbie [place] freid   Anticipated DC Date:     Anticipated DC Plan:           Choice offered to / List presented to:             Status of service:   Medicare Important Message given?   (If response is "NO", the following Medicare IM given date fields will be blank) Date Medicare IM given:   Medicare IM given by:   Date Additional Medicare IM given:   Additional Medicare IM given by:    Discharge Disposition:    Per UR Regulation:  Reviewed for med. necessity/level of care/duration of stay  If discussed at [place] of Stay Meetings, dates discussed:    Comments:

## 2014-02-04 DIAGNOSIS — J45909 Unspecified asthma, uncomplicated: Secondary | ICD-10-CM

## 2014-02-04 DIAGNOSIS — E669 Obesity, unspecified: Secondary | ICD-10-CM

## 2014-02-04 DIAGNOSIS — J40 Bronchitis, not specified as acute or chronic: Secondary | ICD-10-CM

## 2014-02-04 LAB — BASIC METABOLIC PANEL
Anion gap: 14 (ref 5–15)
Anion gap: 16 — ABNORMAL HIGH (ref 5–15)
Anion gap: 16 — ABNORMAL HIGH (ref 5–15)
BUN: 7 mg/dL (ref 6–23)
BUN: 7 mg/dL (ref 6–23)
BUN: 7 mg/dL (ref 6–23)
CO2: 21 mEq/L (ref 19–32)
CO2: 22 mEq/L (ref 19–32)
CO2: 24 mEq/L (ref 19–32)
Calcium: 8.6 mg/dL (ref 8.4–10.5)
Calcium: 8.8 mg/dL (ref 8.4–10.5)
Calcium: 9.2 mg/dL (ref 8.4–10.5)
Chloride: 103 mEq/L (ref 96–112)
Chloride: 103 mEq/L (ref 96–112)
Chloride: 105 mEq/L (ref 96–112)
Creatinine, Ser: 0.56 mg/dL (ref 0.50–1.10)
Creatinine, Ser: 0.59 mg/dL (ref 0.50–1.10)
Creatinine, Ser: 0.6 mg/dL (ref 0.50–1.10)
GFR calc Af Amer: >90 mL/min (ref >90)
GFR calc Af Amer: >90 mL/min (ref >90)
GFR calc Af Amer: >90 mL/min (ref >90)
GFR calc non Af Amer: >90 mL/min (ref >90)
GFR calc non Af Amer: >90 mL/min (ref >90)
GFR calc non Af Amer: >90 mL/min (ref >90)
Glucose, Bld: 111 mg/dL — ABNORMAL HIGH (ref 70–99)
Glucose, Bld: 126 mg/dL — ABNORMAL HIGH (ref 70–99)
Glucose, Bld: 148 mg/dL — ABNORMAL HIGH (ref 70–99)
Potassium: 3.3 mEq/L — ABNORMAL LOW (ref 3.7–5.3)
Potassium: 3.7 mEq/L (ref 3.7–5.3)
Potassium: 3.9 mEq/L (ref 3.7–5.3)
Sodium: 140 mEq/L (ref 137–147)
Sodium: 141 mEq/L (ref 137–147)
Sodium: 143 mEq/L (ref 137–147)

## 2014-02-04 LAB — GLUCOSE, CAPILLARY
Glucose-Capillary: 114 mg/dL — ABNORMAL HIGH (ref 70–99)
Glucose-Capillary: 127 mg/dL — ABNORMAL HIGH (ref 70–99)
Glucose-Capillary: 128 mg/dL — ABNORMAL HIGH (ref 70–99)
Glucose-Capillary: 131 mg/dL — ABNORMAL HIGH (ref 70–99)
Glucose-Capillary: 132 mg/dL — ABNORMAL HIGH (ref 70–99)
Glucose-Capillary: 138 mg/dL — ABNORMAL HIGH (ref 70–99)
Glucose-Capillary: 143 mg/dL — ABNORMAL HIGH (ref 70–99)
Glucose-Capillary: 148 mg/dL — ABNORMAL HIGH (ref 70–99)
Glucose-Capillary: 153 mg/dL — ABNORMAL HIGH (ref 70–99)
Glucose-Capillary: 153 mg/dL — ABNORMAL HIGH (ref 70–99)
Glucose-Capillary: 153 mg/dL — ABNORMAL HIGH (ref 70–99)
Glucose-Capillary: 157 mg/dL — ABNORMAL HIGH (ref 70–99)
Glucose-Capillary: 161 mg/dL — ABNORMAL HIGH (ref 70–99)
Glucose-Capillary: 162 mg/dL — ABNORMAL HIGH (ref 70–99)
Glucose-Capillary: 190 mg/dL — ABNORMAL HIGH (ref 70–99)
Glucose-Capillary: 196 mg/dL — ABNORMAL HIGH (ref 70–99)
Glucose-Capillary: 207 mg/dL — ABNORMAL HIGH (ref 70–99)
Glucose-Capillary: 212 mg/dL — ABNORMAL HIGH (ref 70–99)
Glucose-Capillary: 221 mg/dL — ABNORMAL HIGH (ref 70–99)

## 2014-02-04 MED ORDER — BENZONATATE 100 MG PO CAPS
200.0000 mg | ORAL_CAPSULE | Freq: Three times a day (TID) | ORAL | Status: DC | PRN
  Administered 2014-02-04 (×2): 200 mg via ORAL
  Filled 2014-02-04 (×2): qty 2

## 2014-02-04 MED ORDER — PANTOPRAZOLE SODIUM 40 MG PO TBEC
40.0000 mg | DELAYED_RELEASE_TABLET | Freq: Every day | ORAL | Status: DC
  Administered 2014-02-04 – 2014-02-05 (×2): 40 mg via ORAL
  Filled 2014-02-04 (×2): qty 1

## 2014-02-04 MED ORDER — INSULIN REGULAR HUMAN (CONC) 500 UNIT/ML ~~LOC~~ SOLN
100.0000 [IU] | Freq: Three times a day (TID) | SUBCUTANEOUS | Status: DC
  Filled 2014-02-04: qty 20

## 2014-02-04 MED ORDER — INSULIN REGULAR HUMAN (CONC) 500 UNIT/ML ~~LOC~~ SOLN
100.0000 [IU] | Freq: Three times a day (TID) | SUBCUTANEOUS | Status: DC
  Administered 2014-02-04 – 2014-02-05 (×3): 100 [IU] via SUBCUTANEOUS
  Filled 2014-02-04 (×3): qty 20

## 2014-02-04 MED ORDER — SODIUM CHLORIDE 0.9 % IV SOLN: INTRAVENOUS | Status: DC

## 2014-02-04 NOTE — Progress Notes (Signed)
02/04/14 Patient to be transferred to [address] with Dx of DKA, Hx of Copd,Chronic Sinus Tachycardia,Productive Cough,Wheezing,Sob,Gerd,Asthma,and Hyperlipidemia. IV site 7/10 RFA ,DVT Lovenox.

## 2014-02-04 NOTE — Progress Notes (Signed)
[facility] PROGRESS NOTE  Cheryl [name] HMC:[id number] DOB: July 08, 1966 DOA: 02/02/2014 PCP: Abigail Miyamoto, MD  Assessment/Plan:   DKA (diabetic ketoacidoses): gap nearly closed. Will transition to home insulin and off insulin gtt. Transfer to floor. Home tomorrow if stable Active Problems:   Essential hypertension, benign   Asthma with bronchitis: continue current   Obesity   Type II or unspecified type diabetes mellitus without mention of complication, uncontrolled  Code Status:  full Family Communication:  7/10 at bedside Disposition Plan:  home  Consultants:    Procedures:     Antibiotics:    HPI/Subjective: No N/V. Tolerating solids.  Objective: Filed Vitals:   02/04/14 1210  BP: 108/73  Pulse: 83  Temp: 97.7 F (36.5 C)  Resp: 15    Intake/Output Summary (Last 24 hours) at 02/04/14 1515 Last data filed at 02/04/14 1300  Gross per 24 hour  Intake 1515.81 ml  Output      0 ml  Net 1515.81 ml   Filed Weights   02/02/14 2327 02/03/14 0600 02/04/14 0351  Weight: 100.245 kg (221 lb) 100.245 kg (221 lb) 101.5 kg (223 lb 12.3 oz)    Exam:   General:  In bed. Alert, oriented. Appears comfortable  Cardiovascular: RRR without MGR  Respiratory: pseudowheeze. No wheeze. Good air movement. Breathing nonlabored.  Abdomen: obese, S, NT, ND  Ext: no CCE  Basic Metabolic Panel:  Recent Labs Lab 02/03/14 1650 02/03/14 1956 02/03/14 2348 02/04/14 0838 02/04/14 1319  NA 140 138 140 143 141  K 4.0 3.9 3.9 3.7 3.3*  CL 101 100 103 105 103  CO2 21 20 21 22 24   GLUCOSE 194* 148* 126* 148* 111*  BUN 6 6 7 7 7   CREATININE 0.45* 0.46* 0.60 0.59 0.56  CALCIUM 9.2 9.0 9.2 8.6 8.8   Liver Function Tests:  Recent Labs Lab 02/02/14 2355  AST 38*  ALT 48*  ALKPHOS 119*  BILITOT 1.5*  PROT 8.4*  ALBUMIN 4.5   No results found for this basename: LIPASE, AMYLASE,  in the last 168 hours No results found for this basename: AMMONIA,  in the  last 168 hours CBC:  Recent Labs Lab 02/02/14 2355 02/03/14 0752  WBC 10.6* 9.1  NEUTROABS 6.5  --   HGB 13.9 12.8  HCT 41.1 37.9  MCV 83.4 84.2  PLT 307 272   Cardiac Enzymes: No results found for this basename: CKTOTAL, CKMB, CKMBINDEX, TROPONINI,  in the last 168 hours BNP (last 3 results) No results found for this basename: PROBNP,  in the last 8760 hours CBG:  Recent Labs Lab 02/04/14 0946 02/04/14 1049 02/04/14 1159 02/04/14 1305 02/04/14 1412  GLUCAP 221* 196* 153* 114* 157*    Recent Results (from the past 240 hour(s))  MRSA PCR SCREENING     Status: None   Collection Time    02/03/14  6:21 AM      Result Value Ref Range Status   MRSA by PCR NEGATIVE  NEGATIVE Final   Comment:            The GeneXpert MRSA Assay (FDA     approved for NASAL specimens     only), is one component of a     comprehensive MRSA colonization     surveillance program. It is not     intended to diagnose MRSA     infection nor to guide or     monitor treatment for     MRSA infections.  Studies: Dg Chest 2 View (if Patient Has Fever And/or Copd)  02/03/2014   CLINICAL DATA:  Shortness of breath.  EXAM: CHEST  2 VIEW  COMPARISON:  02/02/2014, from [facility].  FINDINGS: The heart size and mediastinal contours are within normal limits. Both lungs are clear. The visualized skeletal structures are unremarkable.  Postoperative changes in the lower cervical spine.  IMPRESSION: No active cardiopulmonary disease.   Electronically Signed   By: Lucienne Capers M.D.   On: 02/03/2014 01:53    Scheduled Meds: . amitriptyline  50 mg Oral QHS  . antiseptic oral rinse  15 mL Mouth Rinse BID  . atorvastatin  10 mg Oral QPM  . cefUROXime  500 mg Oral BID  . clonazePAM  0.5 mg Oral QHS  . diltiazem  240 mg Oral Daily  . enoxaparin (LOVENOX) injection  40 mg Subcutaneous Daily  . gabapentin  800 mg Oral TID  . insulin regular human CONCENTRATED  100 Units Subcutaneous TID  WC  . levothyroxine  100 mcg Oral QAC breakfast  . mometasone-formoterol  2 puff Inhalation BID  . nebivolol  2.5 mg Oral Daily  . pantoprazole  40 mg Oral Daily   Continuous Infusions: . insulin (NOVOLIN-R) infusion      Time spent: 35 minutes  [facility] Pager [phone]. If 7PM-7AM, please contact night-coverage at [email or web address], password [facility] 02/04/2014, 3:15 PM  LOS: 2 days

## 2014-02-05 LAB — BASIC METABOLIC PANEL
Anion gap: 15 (ref 5–15)
BUN: 8 mg/dL (ref 6–23)
CO2: 24 mEq/L (ref 19–32)
Calcium: 8.5 mg/dL (ref 8.4–10.5)
Chloride: 105 mEq/L (ref 96–112)
Creatinine, Ser: 0.58 mg/dL (ref 0.50–1.10)
GFR calc Af Amer: >90 mL/min (ref >90)
GFR calc non Af Amer: >90 mL/min (ref >90)
Glucose, Bld: 121 mg/dL — ABNORMAL HIGH (ref 70–99)
Potassium: 3.7 mEq/L (ref 3.7–5.3)
Sodium: 144 mEq/L (ref 137–147)

## 2014-02-05 LAB — GLUCOSE, CAPILLARY
Glucose-Capillary: 110 mg/dL — ABNORMAL HIGH (ref 70–99)
Glucose-Capillary: 179 mg/dL — ABNORMAL HIGH (ref 70–99)

## 2014-02-05 NOTE — Discharge Summary (Signed)
Physician Discharge Summary  Wyvonnia Dusky KNL:[id number] DOB: 1965/11/05 DOA: 02/02/2014  PCP: Abigail Miyamoto, MD  Admit date: 02/02/2014 Discharge date: 02/05/2014  Time spent: greater than 30  Recommendations for Outpatient Follow-up:  1.   Discharge Diagnoses:  Principal Problem:   DKA (diabetic ketoacidoses) Active Problems:   Essential hypertension, benign   Asthma with bronchitis   GERD   Obesity   Type II or unspecified type diabetes mellitus without mention of complication, uncontrolled   Obesity, unspecified   Discharge Condition: stable  Filed Weights   02/03/14 0600 02/04/14 0351 02/04/14 1819  Weight: 100.245 kg (221 lb) 101.5 kg (223 lb 12.3 oz) 101.5 kg (223 lb 12.3 oz)    History of present illness:  48 y.o. female  Who was sent by her PCP for productive cough/SOB and elevated blood sugars,  She was given cefdinir for her respiratory symptoms by her PCP but she continue to worsen. She said her blood sugars were 300s and she had ketones in her urine with a home test.  +fevers, not eating well or diabetic foods. She admitted she had skipped several doses of insulin, as she didn't feel well enough to eat or give herself injections. No CP, no N/V/D  In the ER, her blood sugar was elevated and she had a gap of 20-- she was started on an insulin gtt. She was also given albuterol, prednisone and IVF. Hospitalist were called for admission   Hospital Course:  Admitted to step down. IV insulin continued until gap closed. Home insulin regimen resumed. By discharge, patient was tolerating solids and feeling much better.  Antibiotics for bronchitis were continued, as well as bronchodilators for asthma.  Procedures:  none  Consultations:  none  Discharge Exam: Filed Vitals:   02/05/14 0457  BP: 108/71  Pulse: 87  Temp: 97.9 F (36.6 C)  Resp: 12    General: comfortable Cardiovascular: RRR Respiratory: CTA  Discharge Instructions   Activity as  tolerated - No restrictions    Complete by:  As directed      Diet - low sodium heart healthy    Complete by:  As directed      Diet Carb Modified    Complete by:  As directed             Medication List         albuterol 108 (90 BASE) MCG/ACT inhaler  Commonly known as:  PROVENTIL HFA;VENTOLIN HFA  Inhale 2 puffs into the lungs every 6 (six) hours as needed for wheezing or shortness of breath.     albuterol (2.5 MG/3ML) 0.083% nebulizer solution  Commonly known as:  PROVENTIL  Take 2.5 mg by nebulization every 6 (six) hours as needed. For wheezing     amitriptyline 50 MG tablet  Commonly known as:  ELAVIL  Take 50 mg by mouth at bedtime.     aspirin-acetaminophen-caffeine 790-240-97 MG per tablet  Commonly known as:  EXCEDRIN MIGRAINE  Take 1 tablet by mouth every 6 (six) hours as needed for pain.     atorvastatin 10 MG tablet  Commonly known as:  LIPITOR  Take 10 mg by mouth every evening.     cefdinir 300 MG capsule  Commonly known as:  OMNICEF  Take 300 mg by mouth 2 (two) times daily. For 7 days     clonazePAM 0.5 MG tablet  Commonly known as:  KLONOPIN  Take 0.5 mg by mouth at bedtime.     diazepam 5  MG tablet  Commonly known as:  VALIUM  Take 5 mg by mouth at bedtime as needed for anxiety.     diltiazem 240 MG 24 hr capsule  Commonly known as:  CARDIZEM CD  Take 240 mg by mouth daily.     gabapentin 800 MG tablet  Commonly known as:  NEURONTIN  Take 800 mg by mouth 3 (three) times daily.     HUMULIN R 500 UNIT/ML Soln injection  Generic drug:  insulin regular human CONCENTRATED  Inject 50-100 Units into the skin See admin instructions. Take 100 units (draw up to the 20 units mark on the insulin syringe to get actual dose of 100 units) three times a day 30 minutes before meals.  If does not eat and blood sugar is <250, take 50 units (draw up to the 10 units mark on the insulin syringe to get actual dose of 50 units).  If does not eat and blood sugar is  >250, take 75 units (draw up to the 15 units mark on the insulin syringe to get actual dose of 75 units) .     ipratropium-albuterol 0.5-2.5 (3) MG/3ML Soln  Commonly known as:  DUONEB  Take 3 mLs by nebulization every 2 (two) hours as needed (shortness of breath).     levothyroxine 100 MCG tablet  Commonly known as:  SYNTHROID, LEVOTHROID  Take 100 mcg by mouth daily before breakfast.     mometasone-formoterol 100-5 MCG/ACT Aero  Commonly known as:  DULERA  Inhale 2 puffs into the lungs 2 (two) times daily.     nebivolol 5 MG tablet  Commonly known as:  BYSTOLIC  Take 2.5 mg by mouth daily.     omeprazole 40 MG capsule  Commonly known as:  PRILOSEC  Take 40 mg by mouth daily.     promethazine 25 MG tablet  Commonly known as:  PHENERGAN  Take 25 mg by mouth every 6 (six) hours as needed for nausea or vomiting.     tiZANidine 4 MG capsule  Commonly known as:  ZANAFLEX  Take 4 mg by mouth 3 (three) times daily as needed. For muscle spasms     traMADol 50 MG tablet  Commonly known as:  ULTRAM  Take 50 mg by mouth every 12 (twelve) hours as needed for moderate pain.     Vitamin D (Ergocalciferol) 50000 UNITS Caps capsule  Commonly known as:  DRISDOL  Take 50,000 Units by mouth every 7 (seven) days. Takes on Wed       Allergies  Allergen Reactions  . Influenza Vaccines     Throat swelling, "flu" symptoms  . Levofloxacin Anaphylaxis and Hives  . Sulfonamide Derivatives Anaphylaxis  . Biaxin [Clarithromycin] Hives  . Nucynta [Tapentadol Hydrochloride] Other (See Comments)    Halucinations insomnia x3 days  . Vilazodone Hcl Other (See Comments)    Hallucinations  . Amoxicillin Hives  . Carbamazepine Itching  . Ciprofloxacin Hives  . Iohexol      Desc: hives,dyspnea; throat swelling; ok w/ premeds and omnipaque   . Pregabalin Other (See Comments)    Reaction-Syncope & unable to speak  . Percocet [Oxycodone-Acetaminophen] Rash       Follow-up Information   Follow  up with FRIED, Jaymes Graff, MD. (As needed)    Specialty:  Family Medicine   Contact information:   [place] [address] [phone]        The results of significant diagnostics from this hospitalization (including imaging,  microbiology, ancillary and laboratory) are listed below for reference.    Significant Diagnostic Studies: Dg Chest 2 View (if Patient Has Fever And/or Copd)  02/03/2014   CLINICAL DATA:  Shortness of breath.  EXAM: CHEST  2 VIEW  COMPARISON:  02/02/2014, from [facility].  FINDINGS: The heart size and mediastinal contours are within normal limits. Both lungs are clear. The visualized skeletal structures are unremarkable.  Postoperative changes in the lower cervical spine.  IMPRESSION: No active cardiopulmonary disease.   Electronically Signed   By: Lucienne Capers M.D.   On: 02/03/2014 01:53    Microbiology: Recent Results (from the past 240 hour(s))  MRSA PCR SCREENING     Status: None   Collection Time    02/03/14  6:21 AM      Result Value Ref Range Status   MRSA by PCR NEGATIVE  NEGATIVE Final   Comment:            The GeneXpert MRSA Assay (FDA     approved for NASAL specimens     only), is one component of a     comprehensive MRSA colonization     surveillance program. It is not     intended to diagnose MRSA     infection nor to guide or     monitor treatment for     MRSA infections.     Labs: Basic Metabolic Panel:  Recent Labs Lab 02/03/14 1956 02/03/14 2348 02/04/14 0838 02/04/14 1319 02/05/14 0820  NA 138 140 143 141 144  K 3.9 3.9 3.7 3.3* 3.7  CL 100 103 105 103 105  CO2 20 21 22 24 24   GLUCOSE 148* 126* 148* 111* 121*  BUN 6 7 7 7 8   CREATININE 0.46* 0.60 0.59 0.56 0.58  CALCIUM 9.0 9.2 8.6 8.8 8.5   Liver Function Tests:  Recent Labs Lab 02/02/14 2355  AST 38*  ALT 48*  ALKPHOS 119*  BILITOT 1.5*  PROT 8.4*  ALBUMIN 4.5   No results found for this basename: LIPASE, AMYLASE,  in the last  168 hours No results found for this basename: AMMONIA,  in the last 168 hours CBC:  Recent Labs Lab 02/02/14 2355 02/03/14 0752  WBC 10.6* 9.1  NEUTROABS 6.5  --   HGB 13.9 12.8  HCT 41.1 37.9  MCV 83.4 84.2  PLT 307 272   Cardiac Enzymes: No results found for this basename: CKTOTAL, CKMB, CKMBINDEX, TROPONINI,  in the last 168 hours BNP: BNP (last 3 results) No results found for this basename: PROBNP,  in the last 8760 hours CBG:  Recent Labs Lab 02/04/14 1525 02/04/14 1639 02/04/14 1750 02/04/14 2309 02/05/14 0759  GLUCAP 153* 212* 190* 162* 110*       Signed:  Mileah Hemmer L  [facility] 02/05/2014, 11:33 AM

## 2014-03-20 ENCOUNTER — Telehealth: Payer: Self-pay | Admitting: *Deleted

## 2014-03-20 NOTE — Telephone Encounter (Signed)
Patient did not want to make an appointment at this time to see Dr. [name]

## 2014-03-30 ENCOUNTER — Other Ambulatory Visit (HOSPITAL_COMMUNITY): Payer: Self-pay | Admitting: Family Medicine

## 2014-03-30 DIAGNOSIS — IMO0002 Reserved for concepts with insufficient information to code with codable children: Secondary | ICD-10-CM

## 2014-04-10 ENCOUNTER — Ambulatory Visit (HOSPITAL_COMMUNITY)
Admission: RE | Admit: 2014-04-10 | Discharge: 2014-04-10 | Disposition: A | Discharge status: Home / Self Care | Payer: Medicare HMO | Source: Ambulatory Visit | Attending: Family Medicine | Admitting: Family Medicine

## 2014-04-10 DIAGNOSIS — M79609 Pain in unspecified limb: Secondary | ICD-10-CM | POA: Insufficient documentation

## 2014-04-10 DIAGNOSIS — R5381 Other malaise: Secondary | ICD-10-CM | POA: Insufficient documentation

## 2014-04-10 DIAGNOSIS — R209 Unspecified disturbances of skin sensation: Secondary | ICD-10-CM | POA: Diagnosis not present

## 2014-04-10 DIAGNOSIS — R5383 Other fatigue: Secondary | ICD-10-CM | POA: Diagnosis not present

## 2014-04-10 DIAGNOSIS — IMO0002 Reserved for concepts with insufficient information to code with codable children: Secondary | ICD-10-CM | POA: Diagnosis present

## 2014-04-26 ENCOUNTER — Other Ambulatory Visit: Payer: Self-pay | Admitting: Internal Medicine

## 2014-04-28 ENCOUNTER — Other Ambulatory Visit: Payer: Self-pay | Admitting: Internal Medicine

## 2014-04-28 ENCOUNTER — Telehealth: Payer: Self-pay | Admitting: Internal Medicine

## 2014-04-28 MED ORDER — MOMETASONE FURO-FORMOTEROL FUM 100-5 MCG/ACT IN AERO: 2.0000 | INHALATION_SPRAY | Freq: Two times a day (BID) | RESPIRATORY_TRACT | Status: DC

## 2014-04-28 MED ORDER — ALBUTEROL SULFATE HFA 108 (90 BASE) MCG/ACT IN AERS: 2.0000 | INHALATION_SPRAY | Freq: Four times a day (QID) | RESPIRATORY_TRACT | Status: DC | PRN

## 2014-04-28 MED ORDER — CLONAZEPAM 0.5 MG PO TABS: 0.5000 mg | ORAL_TABLET | Freq: Every day | ORAL | Status: DC

## 2014-04-28 NOTE — Telephone Encounter (Signed)
Called and spoke with pt and she is aware of refills that have been sent to the pharmacy.  Nothing further is needed.

## 2014-04-28 NOTE — Telephone Encounter (Signed)
Dr [name] please advise if okay to refill Clonazepam .46m - 1 po qhs Last filled 01/24/14 x 2 RF #[id number] Upcoming OV 06/28/14 at 10:15 Pt states that pharmacy sent a request but she has changed her last name back to her MElwin Sleightname "Fenster" and this may be why we have not recognized the request. I have spoke with the front desk and this is to be corrected if insurance matches the change.   Please advise Dr YAnnamaria Boots Thanks.

## 2014-04-28 NOTE — Telephone Encounter (Signed)
Ok to refill 

## 2014-05-03 ENCOUNTER — Ambulatory Visit: Payer: Commercial Managed Care - HMO | Admitting: Internal Medicine

## 2014-05-31 ENCOUNTER — Emergency Department (HOSPITAL_COMMUNITY): Payer: Commercial Managed Care - HMO

## 2014-05-31 ENCOUNTER — Emergency Department (HOSPITAL_COMMUNITY)
Admission: EM | Admit: 2014-05-31 | Discharge: 2014-06-01 | Disposition: A | Discharge status: Home / Self Care | Payer: Commercial Managed Care - HMO | Attending: Emergency Medicine | Admitting: Emergency Medicine

## 2014-05-31 ENCOUNTER — Encounter (HOSPITAL_COMMUNITY): Payer: Self-pay | Admitting: *Deleted

## 2014-05-31 DIAGNOSIS — F419 Anxiety disorder, unspecified: Secondary | ICD-10-CM | POA: Diagnosis not present

## 2014-05-31 DIAGNOSIS — G9009 Other idiopathic peripheral autonomic neuropathy: Secondary | ICD-10-CM | POA: Diagnosis not present

## 2014-05-31 DIAGNOSIS — Z880 Allergy status to penicillin: Secondary | ICD-10-CM | POA: Diagnosis not present

## 2014-05-31 DIAGNOSIS — R1011 Right upper quadrant pain: Secondary | ICD-10-CM | POA: Diagnosis not present

## 2014-05-31 DIAGNOSIS — K219 Gastro-esophageal reflux disease without esophagitis: Secondary | ICD-10-CM | POA: Insufficient documentation

## 2014-05-31 DIAGNOSIS — Z87448 Personal history of other diseases of urinary system: Secondary | ICD-10-CM | POA: Diagnosis not present

## 2014-05-31 DIAGNOSIS — F329 Major depressive disorder, single episode, unspecified: Secondary | ICD-10-CM | POA: Insufficient documentation

## 2014-05-31 DIAGNOSIS — G8929 Other chronic pain: Secondary | ICD-10-CM | POA: Diagnosis not present

## 2014-05-31 DIAGNOSIS — I10 Essential (primary) hypertension: Secondary | ICD-10-CM | POA: Insufficient documentation

## 2014-05-31 DIAGNOSIS — R112 Nausea with vomiting, unspecified: Secondary | ICD-10-CM | POA: Diagnosis not present

## 2014-05-31 DIAGNOSIS — M797 Fibromyalgia: Secondary | ICD-10-CM | POA: Diagnosis not present

## 2014-05-31 DIAGNOSIS — Z8619 Personal history of other infectious and parasitic diseases: Secondary | ICD-10-CM | POA: Insufficient documentation

## 2014-05-31 DIAGNOSIS — R091 Pleurisy: Secondary | ICD-10-CM | POA: Diagnosis not present

## 2014-05-31 DIAGNOSIS — Z87891 Personal history of nicotine dependence: Secondary | ICD-10-CM | POA: Diagnosis not present

## 2014-05-31 DIAGNOSIS — G43909 Migraine, unspecified, not intractable, without status migrainosus: Secondary | ICD-10-CM | POA: Diagnosis not present

## 2014-05-31 DIAGNOSIS — E039 Hypothyroidism, unspecified: Secondary | ICD-10-CM | POA: Diagnosis not present

## 2014-05-31 DIAGNOSIS — J45901 Unspecified asthma with (acute) exacerbation: Secondary | ICD-10-CM | POA: Insufficient documentation

## 2014-05-31 DIAGNOSIS — Z794 Long term (current) use of insulin: Secondary | ICD-10-CM | POA: Insufficient documentation

## 2014-05-31 DIAGNOSIS — E119 Type 2 diabetes mellitus without complications: Secondary | ICD-10-CM | POA: Diagnosis not present

## 2014-05-31 DIAGNOSIS — R109 Unspecified abdominal pain: Secondary | ICD-10-CM | POA: Diagnosis present

## 2014-05-31 DIAGNOSIS — E559 Vitamin D deficiency, unspecified: Secondary | ICD-10-CM | POA: Insufficient documentation

## 2014-05-31 DIAGNOSIS — R079 Chest pain, unspecified: Secondary | ICD-10-CM

## 2014-05-31 DIAGNOSIS — J441 Chronic obstructive pulmonary disease with (acute) exacerbation: Secondary | ICD-10-CM | POA: Insufficient documentation

## 2014-05-31 LAB — URINALYSIS, ROUTINE W REFLEX MICROSCOPIC
Bilirubin Urine: NEGATIVE
Glucose, UA: NEGATIVE mg/dL
Hgb urine dipstick: NEGATIVE
Ketones, ur: NEGATIVE mg/dL
Leukocytes, UA: NEGATIVE
Nitrite: NEGATIVE
Protein, ur: NEGATIVE mg/dL
Specific Gravity, Urine: 1.012 (ref 1.005–1.030)
Urobilinogen, UA: 1 mg/dL (ref 0.0–1.0)
pH: 6 (ref 5.0–8.0)

## 2014-05-31 LAB — CBC WITH DIFFERENTIAL/PLATELET
Basophils Absolute: 0 10*3/uL (ref 0.0–0.1)
Basophils Relative: 0 % (ref 0–1)
Eosinophils Absolute: 0.7 10*3/uL (ref 0.0–0.7)
Eosinophils Relative: 6 % — ABNORMAL HIGH (ref 0–5)
HCT: 36 % (ref 36.0–46.0)
Hemoglobin: 12.2 g/dL (ref 12.0–15.0)
Lymphocytes Relative: 36 % (ref 12–46)
Lymphs Abs: 4.3 10*3/uL — ABNORMAL HIGH (ref 0.7–4.0)
MCH: 28.3 pg (ref 26.0–34.0)
MCHC: 33.9 g/dL (ref 30.0–36.0)
MCV: 83.5 fL (ref 78.0–100.0)
Monocytes Absolute: 0.5 10*3/uL (ref 0.1–1.0)
Monocytes Relative: 4 % (ref 3–12)
Neutro Abs: 6.6 10*3/uL (ref 1.7–7.7)
Neutrophils Relative %: 54 % (ref 43–77)
Platelets: 289 10*3/uL (ref 150–400)
RBC: 4.31 MIL/uL (ref 3.87–5.11)
RDW: 14.8 % (ref 11.5–15.5)
WBC: 12.2 10*3/uL — ABNORMAL HIGH (ref 4.0–10.5)

## 2014-05-31 LAB — CBG MONITORING, ED
Glucose-Capillary: 107 mg/dL — ABNORMAL HIGH (ref 70–99)
Glucose-Capillary: 112 mg/dL — ABNORMAL HIGH (ref 70–99)
Glucose-Capillary: 162 mg/dL — ABNORMAL HIGH (ref 70–99)
Glucose-Capillary: 163 mg/dL — ABNORMAL HIGH (ref 70–99)
Glucose-Capillary: 35 mg/dL — CL (ref 70–99)
Glucose-Capillary: 42 mg/dL — CL (ref 70–99)
Glucose-Capillary: 53 mg/dL — ABNORMAL LOW (ref 70–99)
Glucose-Capillary: 66 mg/dL — ABNORMAL LOW (ref 70–99)
Glucose-Capillary: 75 mg/dL (ref 70–99)
Glucose-Capillary: 99 mg/dL (ref 70–99)

## 2014-05-31 LAB — LIPASE, BLOOD: Lipase: 15 U/L (ref 11–59)

## 2014-05-31 LAB — COMPREHENSIVE METABOLIC PANEL
ALT: 42 U/L — ABNORMAL HIGH (ref 0–35)
AST: 34 U/L (ref 0–37)
Albumin: 3.9 g/dL (ref 3.5–5.2)
Alkaline Phosphatase: 136 U/L — ABNORMAL HIGH (ref 39–117)
Anion gap: 13 (ref 5–15)
BUN: 11 mg/dL (ref 6–23)
CO2: 26 mEq/L (ref 19–32)
Calcium: 9.4 mg/dL (ref 8.4–10.5)
Chloride: 99 mEq/L (ref 96–112)
Creatinine, Ser: 0.76 mg/dL (ref 0.50–1.10)
GFR calc Af Amer: >90 mL/min (ref >90)
GFR calc non Af Amer: >90 mL/min (ref >90)
Glucose, Bld: 79 mg/dL (ref 70–99)
Potassium: 3.8 mEq/L (ref 3.7–5.3)
Sodium: 138 mEq/L (ref 137–147)
Total Bilirubin: 0.6 mg/dL (ref 0.3–1.2)
Total Protein: 7.8 g/dL (ref 6.0–8.3)

## 2014-05-31 MED ORDER — DEXTROSE-NACL 5-0.9 % IV SOLN
INTRAVENOUS | Status: DC
  Administered 2014-05-31: 19:00:00 via INTRAVENOUS

## 2014-05-31 MED ORDER — DEXTROSE 50 % IV SOLN: 50.0000 mL | Freq: Once | INTRAVENOUS | Status: DC

## 2014-05-31 MED ORDER — DEXTROSE 5 % IV SOLN: Freq: Once | INTRAVENOUS | Status: DC

## 2014-05-31 MED ORDER — DEXTROSE 50 % IV SOLN
50.0000 mL | Freq: Once | INTRAVENOUS | Status: AC
  Administered 2014-05-31: 50 mL via INTRAVENOUS
  Filled 2014-05-31: qty 50

## 2014-05-31 MED ORDER — DEXTROSE 50 % IV SOLN
INTRAVENOUS | Status: AC
  Administered 2014-05-31: 50 mL
  Filled 2014-05-31: qty 50

## 2014-05-31 NOTE — ED Notes (Signed)
CBG-42. Notified RN [place] Herb

## 2014-05-31 NOTE — ED Notes (Signed)
Pt ambulated to bathroom 

## 2014-05-31 NOTE — ED Notes (Signed)
CBG -99

## 2014-05-31 NOTE — ED Notes (Addendum)
Dr. Dalia Heading notified that CBG has decreased to 66 from 112 and 162 in an hour after the amp of D50.  No new orders, continue dextrose 5% 0.9% nacl at 100 ml/hr.  Will continue to monitor.

## 2014-05-31 NOTE — ED Notes (Signed)
CBG-107. Notified RN

## 2014-05-31 NOTE — ED Notes (Addendum)
CBG 35. MD notified.

## 2014-05-31 NOTE — ED Notes (Signed)
CBG-75. Notified RN

## 2014-05-31 NOTE — ED Notes (Signed)
Pt reports onset today of right side abd pain, radiates into her back. Reports decreased urine output today. Having nausea but no vomiting.

## 2014-05-31 NOTE — ED Notes (Signed)
Pt given [place] sandwich, peanut butter and crackers and cup of orange juice. Dr. Johnney Killian stated it was okay for patient to eat.

## 2014-06-01 LAB — CBG MONITORING, ED: Glucose-Capillary: 170 mg/dL — ABNORMAL HIGH (ref 70–99)

## 2014-06-01 MED ORDER — NAPROXEN 250 MG PO TABS
500.0000 mg | ORAL_TABLET | Freq: Once | ORAL | Status: AC
  Administered 2014-06-01: 500 mg via ORAL
  Filled 2014-06-01: qty 2

## 2014-06-01 MED ORDER — IPRATROPIUM-ALBUTEROL 0.5-2.5 (3) MG/3ML IN SOLN
3.0000 mL | RESPIRATORY_TRACT | Status: DC
  Administered 2014-06-01: 3 mL via RESPIRATORY_TRACT
  Filled 2014-06-01 (×2): qty 3

## 2014-06-01 MED ORDER — HYDROCODONE-ACETAMINOPHEN 5-325 MG PO TABS
2.0000 | ORAL_TABLET | Freq: Once | ORAL | Status: AC
Start: 2014-06-01 — End: 2014-06-01
  Administered 2014-06-01: 2 via ORAL
  Filled 2014-06-01: qty 2

## 2014-06-01 NOTE — Discharge Instructions (Signed)

## 2014-06-01 NOTE — ED Provider Notes (Signed)
CSN: [id number]     Arrival date & time 05/31/14  1559 History   First MD Initiated Contact with Patient 05/31/14 1839     Chief Complaint  Patient presents with  . Abdominal Pain  . Flank Pain     (Consider location/radiation/quality/duration/timing/severity/associated sxs/prior Treatment) HPI The patient developed right sided pain. The pain is localized over her right upper quadrant and right lateral lower chest. She reports that it is sharp and stabbing in nature. It is made much worse by certain movements and twisting. The patient chronically has some shortness of breath due to asthma. She does not note that is any worse than baseline for her. She has not had any significant recent cough or fevers or chills. The patient has not had a lower extremity swelling or calf pain. She was seen by her physician yesterday. That was for an unrelated checkup. The patient has had similar pain in the past and been diagnosed with pleurisy.The patient reports that she just didn't feel well today so she did not eat anything that she did take her insulin as per usual. Past Medical History  Diagnosis Date  . GERD (gastroesophageal reflux disease)   . Chronic airway obstruction, not elsewhere classified   . Asthma   . Hyperlipidemia   . Hypothyroidism   . Depression   . Gastroparesis     from DM and chronic narcotic use  . DDD (degenerative disc disease)     CERVIAL AND LUMBAR  . Edema   . Human parvovirus infection   . 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 Fatty liver disease, nonalcoholic   . Positive PPD   . Vitamin D deficiency   . Vocal cord dysfunction   . PONV (postoperative nausea and vomiting)   . Hypertension   . Shortness of breath     "at anytime; it's gotten worse recently" (02/09/2013)  . OSA (obstructive sleep apnea)     "have mask;; don't use it; no one came out to check it" (02/09/2013)  . Type II diabetes mellitus   .  Fibromyalgia     "severe" (02/09/2013)  . Peripheral neuropathy     "severe" (02/09/2013)  . Migraine headache     "weekly; worse lately" (02/09/2013)  . Chronic pain     "q where; herniated disc in my tailbone" (02/09/2013)  . Anxiety   . Interstitial cystitis    Past Surgical History  Procedure Laterality Date  . Abdominal hysterectomy  1993  . Cholecystectomy  ?1990  . Nasal sinus surgery  1986; 1990's    "i've had 3" (02/09/2013)  . Knee arthroscopy Left 1990's    "fell; clot behind knee cap had to be removed" (02/09/2013)  . Hip surgery Right 2002    "did something to the tibula band" (02/09/2013)  . Shoulder arthroscopy w/ rotator cuff repair Right     "had to go deep in rotator cuff" (02/09/2013)  . Anterior cervical decomp/discectomy fusion  2004  . Carpal tunnel release Bilateral ?1980's   Family History  Problem Relation Age of Onset  . Coronary artery disease Father    History  Substance Use Topics  . Smoking status: Former Smoker -- 0.10 packs/day for 3 years    Types: Cigarettes    Quit date: 07/28/2005  . Smokeless tobacco: Never Used  . Alcohol Use: No   OB History    No data available     Review of Systems 10  Systems reviewed and are negative for acute change except as noted in the HPI.    Allergies  Influenza vaccines; Iohexol; Levofloxacin; Nucynta; Pregabalin; Sulfonamide derivatives; Vilazodone hcl; Amoxicillin; Biaxin; Carbamazepine; Ciprofloxacin; and Percocet  Home Medications   Prior to Admission medications   Medication Sig Start Date End Date Taking? Authorizing Provider  albuterol (PROVENTIL HFA;VENTOLIN HFA) 108 (90 BASE) MCG/ACT inhaler Inhale 2 puffs into the lungs every 6 (six) hours as needed for wheezing or shortness of breath. 04/28/14  Yes Deneise Lever, MD  albuterol (PROVENTIL) (2.5 MG/3ML) 0.083% nebulizer solution Take 2.5 mg by nebulization every 6 (six) hours as needed. For wheezing 04/30/12  Yes Deneise Lever, MD   amitriptyline (ELAVIL) 50 MG tablet Take 50 mg by mouth at bedtime.   Yes Historical Provider, MD  atorvastatin (LIPITOR) 10 MG tablet Take 10 mg by mouth every evening.   Yes Historical Provider, MD  clonazePAM (KLONOPIN) 0.5 MG tablet Take 1 tablet (0.5 mg total) by mouth at bedtime. 04/28/14  Yes Deneise Lever, MD  diazepam (VALIUM) 5 MG tablet Take 5 mg by mouth at bedtime as needed for anxiety.   Yes Historical Provider, MD  diltiazem (CARDIZEM CD) 240 MG 24 hr capsule Take 240 mg by mouth daily.     Yes Historical Provider, MD  gabapentin (NEURONTIN) 800 MG tablet Take 800 mg by mouth 3 (three) times daily.    Yes Historical Provider, MD  HYDROcodone-acetaminophen (NORCO/VICODIN) 5-325 MG per tablet Take 1 tablet by mouth every 6 (six) hours as needed for moderate pain.   Yes Historical Provider, MD  insulin regular human CONCENTRATED (HUMULIN R) 500 UNIT/ML SOLN injection Inject 50-100 Units into the skin See admin instructions. Take 100 units (draw up to the 20 units mark on the insulin syringe to get actual dose of 100 units) three times a day 30 minutes before meals.  If does not eat and blood sugar is <250, take 50 units (draw up to the 10 units mark on the insulin syringe to get actual dose of 50 units).  If does not eat and blood sugar is >250, take 75 units (draw up to the 15 units mark on the insulin syringe to get actual dose of 75 units) .   Yes Historical Provider, MD  ipratropium-albuterol (DUONEB) 0.5-2.5 (3) MG/3ML SOLN Take 3 mLs by nebulization every 2 (two) hours as needed (shortness of breath).   Yes Historical Provider, MD  levothyroxine (SYNTHROID, LEVOTHROID) 100 MCG tablet Take 100 mcg by mouth daily before breakfast.   Yes Historical Provider, MD  mometasone-formoterol (DULERA) 100-5 MCG/ACT AERO Inhale 2 puffs into the lungs 2 (two) times daily. 04/28/14  Yes Deneise Lever, MD  nebivolol (BYSTOLIC) 5 MG tablet Take 2.5 mg by mouth daily.    Yes Historical Provider, MD   omeprazole (PRILOSEC) 40 MG capsule Take 40 mg by mouth daily.    Yes Historical Provider, MD  promethazine (PHENERGAN) 25 MG tablet Take 25 mg by mouth every 6 (six) hours as needed for nausea or vomiting.   Yes Historical Provider, MD  tiZANidine (ZANAFLEX) 4 MG capsule Take 4 mg by mouth 3 (three) times daily as needed. For muscle spasms   Yes Historical Provider, MD  Vitamin D, Ergocalciferol, (DRISDOL) 50000 UNITS CAPS Take 50,000 Units by mouth every 7 (seven) days. Takes on Wed   Yes Historical Provider, MD  aspirin-acetaminophen-caffeine (EXCEDRIN MIGRAINE) [phone] MG per tablet Take 1 tablet by mouth every 6 (six) hours as  needed for pain.    Historical Provider, MD  cefdinir (OMNICEF) 300 MG capsule Take 300 mg by mouth 2 (two) times daily. For 7 days    Historical Provider, MD  traMADol (ULTRAM) 50 MG tablet Take 50 mg by mouth every 12 (twelve) hours as needed for moderate pain.    Historical Provider, MD   BP 123/65 mmHg  Pulse 102  Temp(Src) 98.1 F (36.7 C)  Resp 26  Ht 5' 2"  (1.575 m)  Wt 224 lb (101.606 kg)  BMI 40.96 kg/m2  SpO2 93% Physical Exam  Constitutional: She is oriented to person, place, and time. She appears well-developed and well-nourished. No distress.  HENT:  Head: Normocephalic and atraumatic.  Eyes: EOM are normal. Pupils are equal, round, and reactive to light.  Neck: Neck supple.  Cardiovascular: Normal rate, regular rhythm, normal heart sounds and intact distal pulses.  Exam reveals no gallop and no friction rub.   No murmur heard. Pulmonary/Chest: Effort normal and breath sounds normal. No respiratory distress. She has no wheezes. She has no rales. She exhibits tenderness.  The patient's right lateral chest wall is exquisitely tender to palpation and to certain movements. There is no skin rash. No crepitus, no abrasions.  Abdominal: Soft. Bowel sounds are normal. She exhibits no distension. There is tenderness. There is no rebound and no guarding.   Patient has moderate tenderness to palpation along the right upper abdomen and the right lower chest wall. There is no guarding with the abdominal palpation.  Musculoskeletal: Normal range of motion. She exhibits no edema or tenderness.  Neurological: She is alert and oriented to person, place, and time. No cranial nerve deficit. Coordination normal.  Skin: Skin is warm and dry.  Psychiatric:  The patient is mildly anxious but appropriate.    ED Course  Procedures (including critical care time) Labs Review Labs Reviewed  CBC WITH DIFFERENTIAL - Abnormal; Notable for the following:    WBC 12.2 (*)    Lymphs Abs 4.3 (*)    Eosinophils Relative 6 (*)    All other components within normal limits  COMPREHENSIVE METABOLIC PANEL - Abnormal; Notable for the following:    ALT 42 (*)    Alkaline Phosphatase 136 (*)    All other components within normal limits  CBG MONITORING, ED - Abnormal; Notable for the following:    Glucose-Capillary 35 (*)    All other components within normal limits  CBG MONITORING, ED - Abnormal; Notable for the following:    Glucose-Capillary 162 (*)    All other components within normal limits  CBG MONITORING, ED - Abnormal; Notable for the following:    Glucose-Capillary 112 (*)    All other components within normal limits  CBG MONITORING, ED - Abnormal; Notable for the following:    Glucose-Capillary 66 (*)    All other components within normal limits  CBG MONITORING, ED - Abnormal; Notable for the following:    Glucose-Capillary 42 (*)    All other components within normal limits  CBG MONITORING, ED - Abnormal; Notable for the following:    Glucose-Capillary 163 (*)    All other components within normal limits  CBG MONITORING, ED - Abnormal; Notable for the following:    Glucose-Capillary 53 (*)    All other components within normal limits  CBG MONITORING, ED - Abnormal; Notable for the following:    Glucose-Capillary 107 (*)    All other components  within normal limits  CBG MONITORING, ED - Abnormal; Notable  for the following:    Glucose-Capillary 170 (*)    All other components within normal limits  LIPASE, BLOOD  URINALYSIS, ROUTINE W REFLEX MICROSCOPIC  CBG MONITORING, ED  CBG MONITORING, ED    Imaging Review Ct Chest Wo Contrast  05/31/2014   CLINICAL DATA:  Initial encounter for right-sided body starting in the chest radiating to pelvis. Symptom onset today.  EXAM: CT CHEST, ABDOMEN AND PELVIS WITHOUT CONTRAST  TECHNIQUE: Multidetector CT imaging of the chest, abdomen and pelvis was performed following the standard protocol without IV contrast.  COMPARISON:  None.  FINDINGS: CT CHEST FINDINGS  Soft tissue / Mediastinum: There is no axillary lymphadenopathy. No mediastinal or hilar lymphadenopathy. Thoracic esophagus is unremarkable. The heart size is normal. Coronary artery calcification is noted. No pericardial effusion.  Lungs / Pleura: Lungs are clear bilaterally without edema or focal airspace consolidation. No pulmonary parenchymal nodule or. No pneumothorax or pleural.  Bones: Bone windows reveal no worrisome lytic or sclerotic osseous lesions.  CT ABDOMEN AND PELVIS FINDINGS  Hepatobiliary: The liver measures 20.9 cm in craniocaudal length, enlarged. No focal abnormality within the liver parenchyma on this uninfused study. Gallbladder is surgically absent. No intrahepatic or extrahepatic biliary dilation.  Pancreas: No focal mass lesion. No dilatation of the main duct. No intraparenchymal cyst. No peripancreatic edema.  Spleen: No splenomegaly. No focal mass lesion.  Adrenals/Urinary Tract: No adrenal nodule or mass. No stones in either kidney. No hydronephrosis or secondary change either kidney. No ureteral stones. No bladder stones.  Stomach/Bowel: Stomach is nondistended. No gastric wall thickening. No evidence of outlet obstruction. Duodenum is normally positioned as is the ligament of Treitz. No small bowel wall thickening. No  small bowel dilatation. Terminal ileum is The appendix is not visualized, but there is no edema or inflammation in the region of the cecum. No gross colonic mass. No colonic wall thickening. No substantial diverticular change.  Vascular/Lymphatic: No abdominal aortic aneurysm. There is no gastrohepatic or hepatoduodenal ligament lymphadenopathy. No retroperitoneal lymphadenopathy. No pelvic sidewall lymphadenopathy.  Reproductive: Uterus is surgically absent.  There is no adnexal.  Other: No intraperitoneal free fluid.  Musculoskeletal: Bone windows reveal no worrisome lytic or sclerotic osseous lesions.  IMPRESSION: No acute findings in the chest, abdomen, or pelvis. Specifically, no findings to explain the patient's history of right-sided body pain.   Electronically Signed   By: Misty Stanley M.D.   On: 05/31/2014 21:54   Ct Renal Stone Study  05/31/2014   CLINICAL DATA:  Initial encounter for right-sided body starting in the chest radiating to pelvis. Symptom onset today.  EXAM: CT CHEST, ABDOMEN AND PELVIS WITHOUT CONTRAST  TECHNIQUE: Multidetector CT imaging of the chest, abdomen and pelvis was performed following the standard protocol without IV contrast.  COMPARISON:  None.  FINDINGS: CT CHEST FINDINGS  Soft tissue / Mediastinum: There is no axillary lymphadenopathy. No mediastinal or hilar lymphadenopathy. Thoracic esophagus is unremarkable. The heart size is normal. Coronary artery calcification is noted. No pericardial effusion.  Lungs / Pleura: Lungs are clear bilaterally without edema or focal airspace consolidation. No pulmonary parenchymal nodule or. No pneumothorax or pleural.  Bones: Bone windows reveal no worrisome lytic or sclerotic osseous lesions.  CT ABDOMEN AND PELVIS FINDINGS  Hepatobiliary: The liver measures 20.9 cm in craniocaudal length, enlarged. No focal abnormality within the liver parenchyma on this uninfused study. Gallbladder is surgically absent. No intrahepatic or extrahepatic  biliary dilation.  Pancreas: No focal mass lesion. No dilatation of the main  duct. No intraparenchymal cyst. No peripancreatic edema.  Spleen: No splenomegaly. No focal mass lesion.  Adrenals/Urinary Tract: No adrenal nodule or mass. No stones in either kidney. No hydronephrosis or secondary change either kidney. No ureteral stones. No bladder stones.  Stomach/Bowel: Stomach is nondistended. No gastric wall thickening. No evidence of outlet obstruction. Duodenum is normally positioned as is the ligament of Treitz. No small bowel wall thickening. No small bowel dilatation. Terminal ileum is The appendix is not visualized, but there is no edema or inflammation in the region of the cecum. No gross colonic mass. No colonic wall thickening. No substantial diverticular change.  Vascular/Lymphatic: No abdominal aortic aneurysm. There is no gastrohepatic or hepatoduodenal ligament lymphadenopathy. No retroperitoneal lymphadenopathy. No pelvic sidewall lymphadenopathy.  Reproductive: Uterus is surgically absent.  There is no adnexal.  Other: No intraperitoneal free fluid.  Musculoskeletal: Bone windows reveal no worrisome lytic or sclerotic osseous lesions.  IMPRESSION: No acute findings in the chest, abdomen, or pelvis. Specifically, no findings to explain the patient's history of right-sided body pain.   Electronically Signed   By: Misty Stanley M.D.   On: 05/31/2014 21:54     EKG Interpretation None      MDM   Final diagnoses:  Chest pain  Pleurisy   The patient had pain that was between the flank and upper abdomen area as well as the right lower chest wall. The pain had a sharp quality to it and was significantly reproducible with certain movements. At this time by CT scan there is no evidence of kidney stone. Also no pleural effusions or infiltrate to suggest acute pneumonia. The patient has a prior history of pleurisy type pain. She has no prior history of PE or DVT. There were no lower extremity  symptoms. No recent travel or surgery. At this time the patient will be treated for pain. She does have a recent bike and a prescription provided from her primary provider for chronic back pain. She will be advised to use that as well as naproxen or ibuprofen as needed. The patient is counseled to return if she should have any worsening or changing of her symptoms. If she is to follow-up with her family physician for recheck. Of note the patient did have incidence of hypoglycemia. The patient had not eaten and had taken her insulin which appears to be the cause for this. Once her evaluation with CT had been accomplished and there was no evidence of any surgical issue present, she was able to eat and blood sugar stabilized.    Charlesetta Shanks, MD 06/01/14 [phone]

## 2014-06-02 ENCOUNTER — Encounter (INDEPENDENT_AMBULATORY_CARE_PROVIDER_SITE_OTHER): Payer: Commercial Managed Care - HMO | Admitting: Ophthalmology

## 2014-06-07 ENCOUNTER — Other Ambulatory Visit: Payer: Self-pay | Admitting: Dermatology

## 2014-06-14 ENCOUNTER — Encounter (INDEPENDENT_AMBULATORY_CARE_PROVIDER_SITE_OTHER): Payer: Commercial Managed Care - HMO | Admitting: Ophthalmology

## 2014-06-14 DIAGNOSIS — E11319 Type 2 diabetes mellitus with unspecified diabetic retinopathy without macular edema: Secondary | ICD-10-CM

## 2014-06-14 DIAGNOSIS — H2513 Age-related nuclear cataract, bilateral: Secondary | ICD-10-CM

## 2014-06-14 DIAGNOSIS — E11329 Type 2 diabetes mellitus with mild nonproliferative diabetic retinopathy without macular edema: Secondary | ICD-10-CM

## 2014-06-14 DIAGNOSIS — H35033 Hypertensive retinopathy, bilateral: Secondary | ICD-10-CM

## 2014-06-14 DIAGNOSIS — H43813 Vitreous degeneration, bilateral: Secondary | ICD-10-CM

## 2014-06-14 DIAGNOSIS — I10 Essential (primary) hypertension: Secondary | ICD-10-CM

## 2014-06-28 ENCOUNTER — Ambulatory Visit (INDEPENDENT_AMBULATORY_CARE_PROVIDER_SITE_OTHER): Payer: Commercial Managed Care - HMO | Admitting: Internal Medicine

## 2014-06-28 ENCOUNTER — Encounter: Payer: Self-pay | Admitting: Internal Medicine

## 2014-06-28 VITALS — BP 92/64 | HR 93 | Ht 62.0 in | Wt 233.8 lb

## 2014-06-28 DIAGNOSIS — J4531 Mild persistent asthma with (acute) exacerbation: Secondary | ICD-10-CM

## 2014-06-28 DIAGNOSIS — R0683 Snoring: Secondary | ICD-10-CM

## 2014-06-28 MED ORDER — IPRATROPIUM-ALBUTEROL 0.5-2.5 (3) MG/3ML IN SOLN: 3.0000 mL | RESPIRATORY_TRACT | Status: DC | PRN

## 2014-06-28 NOTE — Progress Notes (Signed)
Subjective:    Patient ID: Cheryl [name], female    DOB: 04-03-1966, 48 y.o.   MRN: [id number]  Asthma Her past medical history is significant for asthma.   June 24, 2010- Allergic rhinitis, OSA, DM, GERD..........................Cheryl Kitchenmother here  Nurse-CC: 4 month follow up visit-wheezing, few asthma attacks, sneezing(using nasal sprays). Sleep- waking up "alot" and other days sleeping all day and not able to wake up well enough.  Now on cefdinir for sinusitis. Sneezing all Fall. Had CT sinus at [facility]- "chronic sinusitis".  Mother reports fall 2 weeks ago- fell onto knees and hands when knees buckled. Cheryl [name] now when she stands. Evaluated at [facility]. Anticipate referral to [facility] neurology. Had flu vax. Still lives same house.  Continues CPAP. When too tight she got pressure marks- discussed. Not smoking. Getting divorced- looking at options.   August 28, 2010-Allergic rhinitis, Asthma, OSA, DM, GERD.............Cheryl Kitchenmother here  Nurse-CC: Follow up per [name], FNP. Pt c/o chest pressure and tightness, pains in chest when breathing, cough with very little yellow mucus, increased SOB at rest and with activity x 1 wk.  CPAP 10- Usually using it all night every night. Has had to skip this week while ill.  Says she has been on abx since October for sinusitis- did CT sinus. Gets better then worse. In last 1-2 weeks, increased cough and wheeze , tussive soreness mid anterior and left lateral chest. CBC and CXR at [name]. Says 2 days ago WBC 14000, given nebs. Now on doxy 269m daily.  [facility] and [facility]- uses walker.   11/11/10- Acute OV-Walk in  Complains of increased SOB, wheezing, dry cough, chest congestion, low grade temp x2days. Has a lot of sinus drainage, cough, post nasal drip and wheezing. Wears CPAP at night, no interference. Teeth hurt at  at times. Has fatigue and no energy.  Has been taking zyrtec without much help. Very dry in sinuses. Frontal headache.    12/27/10- Allergic rhinitis, Asthma, OSA, complicated by DM, GERD  Mother here After seeing TP in April got welll after 2 rounds of omnicef. Tongue feels funny. Diabetic control has not been good.  Breathing has been good.   07/08/11- 89YOF former smoker, followed for asthma, allergic rhinitis, OSA/ failed CPAP, complicated by DM, GERD.   Mother here and offers her observations.  Says she felt okay until yesterday. She couldn't walk well, was falling and had gone back to using her walker. They called 911 and they told her she was okay except glucose was high. Mother wonders about "inner ear"/vertigo. Treated with eardrops in October for otitis left ear. Has Bell's palsy off and on causing some weakness in the left lower face which other associates with episodes of viral illness such as bronchitis, or allergy. [name], [name], with a total of 3 rounds of antibiotics this fall.  She now says that cough began about the time she began ramipril from Dr KDwyane Dee    04/29/12- 89YOF former smoker, followed for asthma, allergic rhinitis, OSA/ failed CPAP, complicated by DM, GERD.  Daughter here.  Has had flu shot. Cough, wheezing x 4 days; states friend of hers passed last Friday with MRSA.  Recent cold with increased wheeze, cough, sweating, hoarseness. Reports temperature 101. Scant sputum was green and nasal discharge green  but no headache. 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  09/06/12- 89 YOF former smoker, followed for asthma, allergic rhinitis, OSA/ failed CPAP, complicated by DM, GERD.  Mother here.  Has had flu shot. FOLLOWS BUL:AGTXMI deep cough and wheezing; also states that [facility] came picked up CPAP machine due to Medicare and  stating patient not using it for 90 days. Daily wheeze using rescue inhaler or nebulizer to control.no major distress. We discussed ways to provide more stability. Diabetes and persistent thrush interfere with steroid choices. CXR 05/06/12 IMPRESSION:  No evidence of acute cardiopulmonary disease.  Original Report Authenticated By: Cheryl [name], M.D.  12/01/2012 Acute OV  Complains of wheezing, chest tightness, dry cough, increased SOB - reports no improvement since last ov, worse this AM for 2 weeks .  Has been doing nebs 4x daily. No hemoptysis, n/v, orthopnea, edema, or chest pain.  No otc used. Used Tussionex with some help.  05/04/13  ER follow up  ER follow up , cough, wheezing for weeks with right sided pleuritic pain.  Patient went to the emergency room on October 7 for shortness, of breath and right-sided chest, and abdominal pain. Workup was unrevealing, with a negative. Chest x-ray, abdominal ultrasound and lab work Showed only minimally, elevated LFTs. Patient reports that she continues to have some pain. On her right rib area, especially, if she coughs. Patient describes a dry cough, and intermittent wheezing. She denies any discolored mucus or fever. Orthopnea, PND, leg swelling, or calf pain. Patient says she is currently being evaluated by neurology for her weakness, and muscle spasms.   Says cough is keeping her up at night .   05/31/13- 5 yoF46 YOF former smoker, followed for asthma, allergic rhinitis, OSA/ failed CPAP, complicated by DM, GERD.  Female friend here.   FOLLOWS FOR: has spasms in throat-getting choked up when eating or taking medications. Would like to try to get CPAP back-told that she is stopping breathing again. Had CPAP 11/ [facility]- taken away because of noncompliance Complains of "muscle spasms" in throat-chokes easily. This is episodic. She is seeing a neurologist at [facility] and is on Zanaflex. We discussed reflux precautions. Friend says she snores and stops  breathing. Sleeping in recliner because she says "hurt all over".  08/01/12- 36 yoF46 YOF former smoker, followed for asthma, allergic rhinitis, OSA/ failed CPAP, complicated by DM, GERD.  Mother here FOLLOWS FOR: had to repeat Sleep Study in December- She sleeps in a recliner to avoid back and leg pains. Muscle jerks bother her while sleeping. NPSG 07/06/13 WNL- AHI 4/ hr; PLMAs 2.6/ hr.  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  Mother here FOLLOWS FOR:can not tell a difference with just clonazepam; has had to use Zanaflex with clonazepam. Pt states she is waking up at least 2 times during the night having to use her rescue inhaler and neb tx. Also continues to use Dulera. Green sinus drainage with limited headache. Sleeps in a recliner. Brings DMV form.  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  Mother here FOLLOWS WOE:[id number] doing okay overall; continues to use inhalers often. Will need to get neb meds through [facility] (put order in to set up with them) "allergic to flu shot" Had upper respiratory infection with acute bronchitis  early November now resolved Daughter telling her again that she snores loudly. One sleep study was positive then the next one was negative. CPAP was taken away by DME  CT chest 05/31/14 IMPRESSION: No acute findings in the chest, abdomen, or pelvis. Specifically, no findings to explain the patient's history of right-sided body pain. Electronically Signed  By: Misty Stanley M.D.  On: 05/31/2014 21:54  Review of Systems- see HPI Constitutional:   No-   weight loss, night sweats, fevers, chills,+irregular sleep schedule, +fatigue, lassitude. HEENT:   No-headaches, difficulty swallowing, tooth/dental problems, +sore throat,       No-  sneezing, itching, +ear  ache, nasal congestion, post nasal drip,  CV:  No- chest pain, orthopnea, PND, swelling in lower extremities, anasarca,  dizziness,                palpitations Resp: No- acute  shortness of breath with exertion or at rest.              No- productive cough,  + non-productive cough,  No- coughing up of blood.              No- change in color of mucus.   Skin: No-   rash or lesions. GI:  No-   heartburn, indigestion, abdominal pain, nausea, vomiting,  GU:  MS:  No-   joint pain or swelling.   Neuro-    +tremor Psych:  No- change in mood or affect. No depression or anxiety.  No memory loss.  Objective:   Physical Exam      OBJ- Physical Exam General- Alert, Oriented, Affect-appropriate, Distress- none acute. + overweight Skin- rash-none, lesions- none, excoriation- none Lymphadenopathy- none Head- atraumatic            Eyes- Gross vision intact, PERRLA, conjunctivae and secretions clear            Ears- Hearing, canals-normal            Nose-  +turbinate edema, no-Septal dev, mucus, polyps, erosion,                     perforation             Throat- Mallampati II , mucosa clear , drainage- none, tonsils- atrophic, not                     hoarse Neck- flexible , trachea midline, no stridor , thyroid nl, carotid no bruit Chest - symmetrical excursion , unlabored           Heart/CV- RRR , no murmur , no gallop  , no rub, nl s1 s2                           - JVD- none , edema- none, stasis changes- none, varices- none           Lung- clear to P&A, wheeze- none, cough+light , dullness-none, rub- none           Chest wall-  Abd-  Br/ Gen/ Rectal- Not done, not indicated Extrem- cyanosis- none, clubbing, none, atrophy- none, strength- nl Neuro- +hesitant speech, +intention tremor

## 2014-06-28 NOTE — Patient Instructions (Signed)
Order- script printed for Cheryl [name] to provide Duoneb neb solution   We can consider repeating your sleep study - perhaps a home study- after the first of the year, if needed.

## 2014-06-29 DIAGNOSIS — R0683 Snoring: Secondary | ICD-10-CM | POA: Insufficient documentation

## 2014-06-29 NOTE — Assessment & Plan Note (Signed)
Consider if she would be a candidate for home sleep study in the future. She will discuss with family

## 2014-06-29 NOTE — Assessment & Plan Note (Signed)
Moderate chronic asthmatic bronchitis recovering from recent exacerbation Plan-she has a nebulizer machine. We will arrange nebulizer solution through [facility]

## 2014-07-31 DIAGNOSIS — R1031 Right lower quadrant pain: Secondary | ICD-10-CM | POA: Diagnosis not present

## 2014-07-31 DIAGNOSIS — M797 Fibromyalgia: Secondary | ICD-10-CM | POA: Diagnosis not present

## 2014-07-31 DIAGNOSIS — G894 Chronic pain syndrome: Secondary | ICD-10-CM | POA: Diagnosis not present

## 2014-07-31 DIAGNOSIS — M62838 Other muscle spasm: Secondary | ICD-10-CM | POA: Diagnosis not present

## 2014-09-19 DIAGNOSIS — G2581 Restless legs syndrome: Secondary | ICD-10-CM | POA: Diagnosis not present

## 2014-09-19 DIAGNOSIS — M797 Fibromyalgia: Secondary | ICD-10-CM | POA: Diagnosis not present

## 2014-09-19 DIAGNOSIS — I10 Essential (primary) hypertension: Secondary | ICD-10-CM | POA: Diagnosis not present

## 2014-09-19 DIAGNOSIS — J0100 Acute maxillary sinusitis, unspecified: Secondary | ICD-10-CM | POA: Diagnosis not present

## 2014-09-19 DIAGNOSIS — G894 Chronic pain syndrome: Secondary | ICD-10-CM | POA: Diagnosis not present

## 2014-09-28 ENCOUNTER — Encounter (HOSPITAL_COMMUNITY): Payer: Self-pay | Admitting: *Deleted

## 2014-09-28 ENCOUNTER — Emergency Department (HOSPITAL_COMMUNITY)
Admission: EM | Admit: 2014-09-28 | Discharge: 2014-09-28 | Disposition: A | Discharge status: Home / Self Care | Payer: Commercial Managed Care - HMO | Attending: Emergency Medicine | Admitting: Emergency Medicine

## 2014-09-28 ENCOUNTER — Emergency Department (HOSPITAL_COMMUNITY): Payer: Commercial Managed Care - HMO

## 2014-09-28 DIAGNOSIS — Z87891 Personal history of nicotine dependence: Secondary | ICD-10-CM | POA: Insufficient documentation

## 2014-09-28 DIAGNOSIS — M797 Fibromyalgia: Secondary | ICD-10-CM | POA: Insufficient documentation

## 2014-09-28 DIAGNOSIS — G629 Polyneuropathy, unspecified: Secondary | ICD-10-CM | POA: Insufficient documentation

## 2014-09-28 DIAGNOSIS — K219 Gastro-esophageal reflux disease without esophagitis: Secondary | ICD-10-CM | POA: Insufficient documentation

## 2014-09-28 DIAGNOSIS — F329 Major depressive disorder, single episode, unspecified: Secondary | ICD-10-CM | POA: Diagnosis not present

## 2014-09-28 DIAGNOSIS — J441 Chronic obstructive pulmonary disease with (acute) exacerbation: Secondary | ICD-10-CM | POA: Insufficient documentation

## 2014-09-28 DIAGNOSIS — F419 Anxiety disorder, unspecified: Secondary | ICD-10-CM | POA: Diagnosis not present

## 2014-09-28 DIAGNOSIS — E119 Type 2 diabetes mellitus without complications: Secondary | ICD-10-CM | POA: Diagnosis not present

## 2014-09-28 DIAGNOSIS — E785 Hyperlipidemia, unspecified: Secondary | ICD-10-CM | POA: Diagnosis not present

## 2014-09-28 DIAGNOSIS — Z85820 Personal history of malignant melanoma of skin: Secondary | ICD-10-CM | POA: Insufficient documentation

## 2014-09-28 DIAGNOSIS — R079 Chest pain, unspecified: Secondary | ICD-10-CM | POA: Diagnosis not present

## 2014-09-28 DIAGNOSIS — R000 Tachycardia, unspecified: Secondary | ICD-10-CM | POA: Insufficient documentation

## 2014-09-28 DIAGNOSIS — Z79899 Other long term (current) drug therapy: Secondary | ICD-10-CM | POA: Diagnosis not present

## 2014-09-28 DIAGNOSIS — G43909 Migraine, unspecified, not intractable, without status migrainosus: Secondary | ICD-10-CM | POA: Insufficient documentation

## 2014-09-28 DIAGNOSIS — E039 Hypothyroidism, unspecified: Secondary | ICD-10-CM | POA: Insufficient documentation

## 2014-09-28 DIAGNOSIS — G8929 Other chronic pain: Secondary | ICD-10-CM | POA: Insufficient documentation

## 2014-09-28 DIAGNOSIS — R091 Pleurisy: Secondary | ICD-10-CM

## 2014-09-28 DIAGNOSIS — I10 Essential (primary) hypertension: Secondary | ICD-10-CM | POA: Insufficient documentation

## 2014-09-28 DIAGNOSIS — R0600 Dyspnea, unspecified: Secondary | ICD-10-CM | POA: Diagnosis not present

## 2014-09-28 DIAGNOSIS — R0602 Shortness of breath: Secondary | ICD-10-CM | POA: Diagnosis not present

## 2014-09-28 DIAGNOSIS — Z880 Allergy status to penicillin: Secondary | ICD-10-CM | POA: Insufficient documentation

## 2014-09-28 DIAGNOSIS — E559 Vitamin D deficiency, unspecified: Secondary | ICD-10-CM | POA: Diagnosis not present

## 2014-09-28 DIAGNOSIS — Z7951 Long term (current) use of inhaled steroids: Secondary | ICD-10-CM | POA: Diagnosis not present

## 2014-09-28 LAB — CBG MONITORING, ED
Glucose-Capillary: 57 mg/dL — ABNORMAL LOW (ref 70–99)
Glucose-Capillary: 99 mg/dL (ref 70–99)
Glucose-Capillary: 72 mg/dL (ref 70–99)
Glucose-Capillary: 49 mg/dL — ABNORMAL LOW (ref 70–99)
Glucose-Capillary: 161 mg/dL — ABNORMAL HIGH (ref 70–99)
Glucose-Capillary: 191 mg/dL — ABNORMAL HIGH (ref 70–99)

## 2014-09-28 LAB — I-STAT TROPONIN, ED
Troponin i, poc: 0 ng/mL (ref 0.00–0.08)
Troponin i, poc: 0 ng/mL (ref 0.00–0.08)

## 2014-09-28 LAB — BASIC METABOLIC PANEL
Anion gap: 8 (ref 5–15)
BUN: <5 mg/dL — ABNORMAL LOW (ref 6–23)
CO2: 31 mmol/L (ref 19–32)
Calcium: 9.8 mg/dL (ref 8.4–10.5)
Chloride: 101 mmol/L (ref 96–112)
Creatinine, Ser: 0.67 mg/dL (ref 0.50–1.10)
GFR calc Af Amer: >90 mL/min (ref >90)
GFR calc non Af Amer: >90 mL/min (ref >90)
Glucose, Bld: 58 mg/dL — ABNORMAL LOW (ref 70–99)
Potassium: 3.7 mmol/L (ref 3.5–5.1)
Sodium: 140 mmol/L (ref 135–145)

## 2014-09-28 LAB — CBC
HCT: 41 % (ref 36.0–46.0)
Hemoglobin: 14 g/dL (ref 12.0–15.0)
MCH: 29 pg (ref 26.0–34.0)
MCHC: 34.1 g/dL (ref 30.0–36.0)
MCV: 85.1 fL (ref 78.0–100.0)
Platelets: 373 10*3/uL (ref 150–400)
RBC: 4.82 MIL/uL (ref 3.87–5.11)
RDW: 14.6 % (ref 11.5–15.5)
WBC: 13.1 10*3/uL — ABNORMAL HIGH (ref 4.0–10.5)

## 2014-09-28 MED ORDER — FENTANYL CITRATE 0.05 MG/ML IJ SOLN: 50.0000 ug | Freq: Once | INTRAMUSCULAR | Status: DC

## 2014-09-28 MED ORDER — SODIUM CHLORIDE 0.9 % IV BOLUS (SEPSIS): 1000.0000 mL | Freq: Once | INTRAVENOUS | Status: DC

## 2014-09-28 MED ORDER — IBUPROFEN 800 MG PO TABS: 800.0000 mg | ORAL_TABLET | Freq: Three times a day (TID) | ORAL | Status: DC

## 2014-09-28 MED ORDER — IBUPROFEN 800 MG PO TABS: 800.0000 mg | ORAL_TABLET | Freq: Three times a day (TID) | ORAL | Status: DC | PRN

## 2014-09-28 MED ORDER — KETOROLAC TROMETHAMINE 30 MG/ML IJ SOLN
30.0000 mg | Freq: Once | INTRAMUSCULAR | Status: AC
  Administered 2014-09-28: 30 mg via INTRAVENOUS
  Filled 2014-09-28: qty 1

## 2014-09-28 MED ORDER — TECHNETIUM TC 99M DIETHYLENETRIAME-PENTAACETIC ACID: 40.0000 | Freq: Once | INTRAVENOUS | Status: DC | PRN

## 2014-09-28 MED ORDER — TECHNETIUM TO 99M ALBUMIN AGGREGATED
6.0000 | Freq: Once | INTRAVENOUS | Status: AC | PRN
  Administered 2014-09-28: 6 via INTRAVENOUS

## 2014-09-28 MED ORDER — SODIUM CHLORIDE 0.9 % IV BOLUS (SEPSIS)
1000.0000 mL | Freq: Once | INTRAVENOUS | Status: AC
  Administered 2014-09-28: 1000 mL via INTRAVENOUS

## 2014-09-28 MED ORDER — HYDROMORPHONE HCL 1 MG/ML IJ SOLN: 1.0000 mg | Freq: Once | INTRAMUSCULAR | Status: DC

## 2014-09-28 MED ORDER — KCL IN DEXTROSE-NACL 20-5-0.45 MEQ/L-%-% IV SOLN
Freq: Once | INTRAVENOUS | Status: AC
  Administered 2014-09-28: 21:00:00 via INTRAVENOUS
  Filled 2014-09-28: qty 1000

## 2014-09-28 NOTE — ED Notes (Signed)
Dr. Ellender Hose informed of patients CBG of 49. Pt given peanut butter, crackers and orange juice. No acute distress noted.

## 2014-09-28 NOTE — Discharge Instructions (Signed)
-   Take ibuprofen every 8 hours for the next 5-7 days, then every 8 hours as needed for pain. - Follow up with your PCP for further evaluation  Pleurisy Pleurisy is an inflammation and swelling of the lining of the lungs (pleura). Because of this inflammation, it hurts to breathe. It can be aggravated by coughing, laughing, or deep breathing. Pleurisy is often caused by an underlying infection or disease.  HOME CARE INSTRUCTIONS  Monitor your pleurisy for any changes. The following actions may help to alleviate any discomfort you are experiencing:  Medicine may help with pain. Only take over-the-counter or prescription medicines for pain, discomfort, or fever as directed by your health care provider.  Only take antibiotic medicine as directed. Make sure to finish it even if you start to feel better. SEEK MEDICAL CARE IF:   Your pain is not controlled with medicine or is increasing.  You have an increase in pus-like (purulent) secretions brought up with coughing. SEEK IMMEDIATE MEDICAL CARE IF:   You have blue or dark lips, fingernails, or toenails.  You are coughing up blood.  You have increased difficulty breathing.  You have continuing pain unrelieved by medicine or pain lasting more than 1 week.  You have pain that radiates into your neck, arms, or jaw.  You develop increased shortness of breath or wheezing.  You develop a fever, rash, vomiting, fainting, or other serious symptoms. MAKE SURE YOU:  Understand these instructions.   Will watch your condition.   Will get help right away if you are not doing well or get worse.  Document Released: 07/14/2005 Document Revised: 03/16/2013 Document Reviewed: 12/26/2012 [facility] Patient Information 2015 [place], [place]. This information is not intended to replace advice given to you by your health care provider. Make sure you discuss any questions you have with your health care provider.

## 2014-09-28 NOTE — ED Provider Notes (Signed)
CSN: [id number]     Arrival date & time 09/28/14  1536 History   First MD Initiated Contact with Patient 09/28/14 1735     Chief Complaint  Patient presents with  . Chest Pain     (Consider location/radiation/quality/duration/timing/severity/associated sxs/prior Treatment) HPI  49 year old female with past medical history of hypertension, hyperlipidemia, fibromyalgia, asthma, history of pleurisy as well as history of malignant melanoma of the left arm status post resection who presents with a three-day history of left chest pain. The patient states her symptoms began acutely upon awakening 3 days ago. She describes the pain as a sharp aching and stabbing sensation across her left and left lateral chest wall, up into her arm. The pain is made worse with deep breathing as well as any movement and is temporarily relieved with splinting her arm against her chest wall. She denies any recent trauma to the area and denies any recent falls. She has had a mild cough associated with this but no hemoptysis or sputum production. She has a history of similar symptoms in November of last year and she was diagnosed with pleurisy. She denies any known history of blood clots. Denies any unilateral leg swelling, estrogen use, or tobacco use. Denies any fevers or chills.  Past Medical History  Diagnosis Date  . GERD (gastroesophageal reflux disease)   . Chronic airway obstruction, not elsewhere classified   . Asthma   . Hyperlipidemia   . Hypothyroidism   . Depression   . Gastroparesis     from DM and chronic narcotic use  . DDD (degenerative disc disease)     CERVIAL AND LUMBAR  . Edema   . Human parvovirus infection   . 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 Fatty liver disease, nonalcoholic   . Positive PPD   . Vitamin D deficiency   . Vocal cord dysfunction   . PONV (postoperative nausea and vomiting)   . Hypertension   . Shortness of breath    "at anytime; it's gotten worse recently" (02/09/2013)  . OSA (obstructive sleep apnea)     "have mask;; don't use it; no one came out to check it" (02/09/2013)  . Type II diabetes mellitus   . Fibromyalgia     "severe" (02/09/2013)  . Peripheral neuropathy     "severe" (02/09/2013)  . Migraine headache     "weekly; worse lately" (02/09/2013)  . Chronic pain     "q where; herniated disc in my tailbone" (02/09/2013)  . Anxiety   . Interstitial cystitis    Past Surgical History  Procedure Laterality Date  . Abdominal hysterectomy  1993  . Cholecystectomy  ?1990  . Nasal sinus surgery  1986; 1990's    "i've had 3" (02/09/2013)  . Knee arthroscopy Left 1990's    "fell; clot behind knee cap had to be removed" (02/09/2013)  . Hip surgery Right 2002    "did something to the tibula band" (02/09/2013)  . Shoulder arthroscopy w/ rotator cuff repair Right     "had to go deep in rotator cuff" (02/09/2013)  . Anterior cervical decomp/discectomy fusion  2004  . Carpal tunnel release Bilateral ?1980's   Family History  Problem Relation Age of Onset  . Coronary artery disease Father    History  Substance Use Topics  . Smoking status: Former Smoker -- 0.10 packs/day for 3 years    Types: Cigarettes    Quit date: 07/28/2005  . Smokeless tobacco:  Never Used  . Alcohol Use: No   OB History    No data available     Review of Systems  Constitutional: Negative for fever, chills and diaphoresis.  HENT: Negative for congestion, rhinorrhea and sore throat.   Respiratory: Positive for shortness of breath. Negative for cough and chest tightness.   Cardiovascular: Positive for chest pain. Negative for leg swelling.  Gastrointestinal: Negative for nausea, vomiting, abdominal pain and diarrhea.  Genitourinary: Negative for flank pain.  Musculoskeletal: Negative for gait problem, neck pain and neck stiffness.  Skin: Negative for rash.  Allergic/Immunologic: Negative for immunocompromised state.    Neurological: Negative for dizziness, syncope, weakness, light-headedness and headaches.      Allergies  Influenza vaccines; Iohexol; Levofloxacin; Nucynta; Pregabalin; Sulfonamide derivatives; Vilazodone hcl; Amoxicillin; Biaxin; Carbamazepine; Ciprofloxacin; and Percocet  Home Medications   Prior to Admission medications   Medication Sig Start Date End Date Taking? Authorizing Provider  albuterol (PROVENTIL HFA;VENTOLIN HFA) 108 (90 BASE) MCG/ACT inhaler Inhale 2 puffs into the lungs every 6 (six) hours as needed for wheezing or shortness of breath. 04/28/14   Deneise Lever, MD  amitriptyline (ELAVIL) 50 MG tablet Take 50 mg by mouth at bedtime.    Historical Provider, MD  atorvastatin (LIPITOR) 10 MG tablet Take 10 mg by mouth every evening.    Historical Provider, MD  clonazePAM (KLONOPIN) 0.5 MG tablet Take 1 tablet (0.5 mg total) by mouth at bedtime. 04/28/14   Deneise Lever, MD  diazepam (VALIUM) 5 MG tablet Take 5 mg by mouth at bedtime as needed for anxiety.    Historical Provider, MD  diltiazem (CARDIZEM CD) 240 MG 24 hr capsule Take 240 mg by mouth daily.      Historical Provider, MD  gabapentin (NEURONTIN) 800 MG tablet Take 800 mg by mouth 3 (three) times daily.     Historical Provider, MD  HYDROcodone-acetaminophen (NORCO/VICODIN) 5-325 MG per tablet Take 1 tablet by mouth every 6 (six) hours as needed for moderate pain.    Historical Provider, MD  insulin regular human CONCENTRATED (HUMULIN R) 500 UNIT/ML SOLN injection Inject 50-100 Units into the skin See admin instructions. Take 100 units (draw up to the 20 units mark on the insulin syringe to get actual dose of 100 units) three times a day 30 minutes before meals.  If does not eat and blood sugar is <250, take 50 units (draw up to the 10 units mark on the insulin syringe to get actual dose of 50 units).  If does not eat and blood sugar is >250, take 75 units (draw up to the 15 units mark on the insulin syringe to get actual  dose of 75 units) .    Historical Provider, MD  ipratropium-albuterol (DUONEB) 0.5-2.5 (3) MG/3ML SOLN Take 3 mLs by nebulization every 2 (two) hours as needed (shortness of breath). 06/28/14   Deneise Lever, MD  levothyroxine (SYNTHROID, LEVOTHROID) 100 MCG tablet Take 100 mcg by mouth daily before breakfast.    Historical Provider, MD  mometasone-formoterol (DULERA) 100-5 MCG/ACT AERO Inhale 2 puffs into the lungs 2 (two) times daily. 04/28/14   Deneise Lever, MD  nebivolol (BYSTOLIC) 5 MG tablet Take 2.5 mg by mouth daily.     Historical Provider, MD  omeprazole (PRILOSEC) 40 MG capsule Take 40 mg by mouth daily.     Historical Provider, MD  promethazine (PHENERGAN) 25 MG tablet Take 25 mg by mouth every 6 (six) hours as needed for nausea or vomiting.  Historical Provider, MD  tiZANidine (ZANAFLEX) 4 MG capsule Take 4 mg by mouth 3 (three) times daily as needed. For muscle spasms    Historical Provider, MD  Vitamin D, Ergocalciferol, (DRISDOL) 50000 UNITS CAPS Take 50,000 Units by mouth every 7 (seven) days. Takes on [place] Provider, MD   BP 147/77 mmHg  Pulse 117  Temp(Src) 98.3 F (36.8 C) (Oral)  Resp 16  Ht 5' 2"  (1.575 m)  Wt 230 lb (104.327 kg)  BMI 42.06 kg/m2  SpO2 95% Physical Exam  Constitutional: She is oriented to person, place, and time. She appears well-developed and well-nourished. No distress.  HENT:  Head: Normocephalic and atraumatic.  Mouth/Throat: No oropharyngeal exudate.  Eyes: Conjunctivae are normal. Pupils are equal, round, and reactive to light.  Neck: Normal range of motion. Neck supple.  Cardiovascular: Normal heart sounds and intact distal pulses.  Tachycardia present.  Exam reveals no friction rub.   No murmur heard. Pulmonary/Chest: Effort normal and breath sounds normal. No respiratory distress. She has no wheezes. She has no rales.    Abdominal: Soft. Bowel sounds are normal. She exhibits no distension. There is no tenderness.    Musculoskeletal: She exhibits no edema.  Neurological: She is alert and oriented to person, place, and time.  Skin: No rash noted.  Nursing note and vitals reviewed.   ED Course  Procedures (including critical care time) Labs Review Labs Reviewed  CBC - Abnormal; Notable for the following:    WBC 13.1 (*)    All other components within normal limits  BASIC METABOLIC PANEL - Abnormal; Notable for the following:    Glucose, Bld 58 (*)    BUN <5 (*)    All other components within normal limits  CBG MONITORING, ED - Abnormal; Notable for the following:    Glucose-Capillary 57 (*)    All other components within normal limits  CBG MONITORING, ED - Abnormal; Notable for the following:    Glucose-Capillary 49 (*)    All other components within normal limits  CBG MONITORING, ED - Abnormal; Notable for the following:    Glucose-Capillary 161 (*)    All other components within normal limits  CBG MONITORING, ED - Abnormal; Notable for the following:    Glucose-Capillary 191 (*)    All other components within normal limits  I-STAT TROPOININ, ED  CBG MONITORING, ED  CBG MONITORING, ED  Randolm Idol, ED    Imaging Review Dg Chest 2 View  09/28/2014   CLINICAL DATA:  Chest pain  EXAM: CHEST  2 VIEW  COMPARISON:  05/31/2014  FINDINGS: Cardiomediastinal silhouette is unremarkable. No acute infiltrate or pleural effusion. No pulmonary edema. Mild thoracic spine osteopenia.  IMPRESSION: No active cardiopulmonary disease.   Electronically Signed   By: Lahoma Crocker M.D.   On: 09/28/2014 16:35   Nm Pulmonary Perf And Vent  09/28/2014   CLINICAL DATA:  Shortness of breath and dyspnea, left-sided chest pain  EXAM: NUCLEAR MEDICINE VENTILATION - PERFUSION LUNG SCAN  TECHNIQUE: Ventilation images were obtained in multiple projections using inhaled aerosol technetium 99 M DTPA. Perfusion images were obtained in multiple projections after intravenous injection of Tc-19mMAA.  RADIOPHARMACEUTICALS:  Four  in mCi Tc-978mTPA aerosol and 6 mCi Tc-9938mA  COMPARISON:  Chest x-ray from earlier in the same day.  FINDINGS: Ventilation: No focal ventilation defect.  Perfusion: No wedge shaped peripheral perfusion defects to suggest acute pulmonary embolism.  IMPRESSION: No evidence of pulmonary embolism.  Electronically Signed   By: Inez Catalina M.D.   On: 09/28/2014 20:36     EKG Interpretation   Date/Time:  Thursday September 28 2014 16:01:17 EST Ventricular Rate:  113 PR Interval:  166 QRS Duration: 88 QT Interval:  336 QTC Calculation: 460 R Axis:   84 Text Interpretation:  Sinus tachycardia Otherwise normal ECG No  significant change was found Confirmed by Wyvonnia Dusky  MD, STEPHEN [phone]) on  09/28/2014 6:36:12 PM      MDM   49 year old female with past medical history of hypertension, hyperlipidemia, fibromyalgia, asthma, history of pleurisy as well as history of malignant melanoma of the left arm status post resection who presents with a three-day history of left chest pain. No trauma. See HPI above. ON arrival, T 97.62F, HR 117, RR 24, BP 144/75, satting 97% on RA. Exam as above, pt overall very well-appearing and in NAD, with TTP over left lateral chest wall but symmetric, equal breath sounds. Remainder as above.  Pt's presentation is most consistent with recurrent pleurisy versus musculoskeletal chest wall pain. Patient has h/o the same and was diagnosed with pleurisy following normal CT scan several months ago. She does have h/o fibromyalgia, diffuse joint aches, and now recurrent pleurisy raising concern for possible underlying autoimmune condition but pt is o/w afebrile, well-appearing, and is being followed by her PCP for this. Primary DDx consideration at this time includes possible PE given h/o cancer, though this was localized and resected. No estrogen use or leg swelling. Pt is allergic to CT contrast - will obtain V/Q scan. Regarding her tachycardia - this is baseline per patient and per  review of records. She states she normally runs "around 120" and "everyone in the family has high heart rates." She has stable BP, no fever, do not suspect sepsis. Will give pain control with anti-inflammatories and re-assess. No abdominal TTP, no splenomegaly or recent URI/infections, and do not suspect intra-abdominal pathology. No dysuria, frequency, hematuria, or signs of UTI, pyelo, or stone and entire L chest wall is hurting, not c/w CVAT or renal etiology. WIll f/u labs, plan for delta troponin given HEAR score <3, and d/c if negative.  HR improved after pain control. Pt now states her HR is the lowest it has been "in some time," averaging mid 90s-100. BP stable. Labs as above. CBC with WBC 13.1 - pt has h/o chronic leukocytosis and this is not significant elevated from baseline. She states she follows with her PCP for this. BMP without acute abnormality. Troponin negative x 2. Of note, CBG 49. Pt states she has a h/o labile blood sugars, denies any accidental or intentional overdose of insulin. Will give PO carbohydrates here. CXR clear. Awaiting V/Q scan.  V/Q scan negative. Patient states her CP has completely resolved following toradol. POC glucose now >100 x 2 after eating a sandwhich. Satting 100% on RA. Given negative labs, imaging, and negative delta troponin, will d/c with outpt f/u and schduled NSAIDs. Pt and family in agreement.  Clinical Impression: 1. Pleurisy     Disposition: Discharge  Condition: Good  I have discussed the results, Dx and Tx plan with the pt(& family if present). He/she/they expressed understanding and agree(s) with the plan. Discharge instructions discussed at great length. Strict return precautions discussed and pt &/or family have verbalized understanding of the instructions. No further questions at time of discharge.   Follow Up: Abigail Miyamoto, MD [place] [id number] [phone]   Follow-up with your  PCP in 2-3 days.   Pt seen in  conjunction with Dr. Everitt Amber, MD 09/29/14 [place], MD 09/29/14 [phone]

## 2014-09-28 NOTE — ED Notes (Signed)
Pt in c/o left upper abdominal pain, left chest pain, and left arm pain- pt reports all of these symptoms are worse with movement and it hurts to breath, pt has been holding a pillow to her chest to help with pain, denies n/v or other symptoms

## 2014-10-03 ENCOUNTER — Telehealth: Payer: Self-pay | Admitting: Internal Medicine

## 2014-10-03 MED ORDER — ALBUTEROL SULFATE HFA 108 (90 BASE) MCG/ACT IN AERS: 2.0000 | INHALATION_SPRAY | Freq: Four times a day (QID) | RESPIRATORY_TRACT | Status: DC | PRN

## 2014-10-03 NOTE — Telephone Encounter (Signed)
Pt states that her insurance is not covering Proair HFA  I advised the patient that she needed to call the insurance company for the alternative medication that is covered. Pt refused to call. Requested that our office call for her. I explained that she needed to call since she had the letter in her possession and the number to contact on hand. Pt refused again and asked that we just do the PA for the Proair.  Please advise Dr [name] on an alternative you would like to try.   Pt would like Dr [name] to review ED notes - pt diagnosed with pleurisy again 09/28/14 Pt is currently taking Ibuprofen 867m for pain - pt states that this is not controlling her pain.  Pt states that she is improving. Completed abx before going into ED 09/28/14 - pt does not remember name of medication.   Please advise Dr YAnnamaria Boots Thanks.

## 2014-10-03 NOTE — Telephone Encounter (Signed)
Per CY--  No PA Order albuterol HFA  #1  2 puffs every 4-6 hours prn  Refill prn

## 2014-10-03 NOTE — Telephone Encounter (Signed)
Rx has been sent in per CY. Pt is aware. Nothing further was needed at this time.

## 2014-10-05 DIAGNOSIS — D2262 Melanocytic nevi of left upper limb, including shoulder: Secondary | ICD-10-CM | POA: Diagnosis not present

## 2014-10-05 DIAGNOSIS — D2261 Melanocytic nevi of right upper limb, including shoulder: Secondary | ICD-10-CM | POA: Diagnosis not present

## 2014-10-05 DIAGNOSIS — D2271 Melanocytic nevi of right lower limb, including hip: Secondary | ICD-10-CM | POA: Diagnosis not present

## 2014-10-05 DIAGNOSIS — D225 Melanocytic nevi of trunk: Secondary | ICD-10-CM | POA: Diagnosis not present

## 2014-10-05 DIAGNOSIS — L821 Other seborrheic keratosis: Secondary | ICD-10-CM | POA: Diagnosis not present

## 2014-10-05 DIAGNOSIS — B353 Tinea pedis: Secondary | ICD-10-CM | POA: Diagnosis not present

## 2014-10-05 DIAGNOSIS — D2272 Melanocytic nevi of left lower limb, including hip: Secondary | ICD-10-CM | POA: Diagnosis not present

## 2014-10-05 DIAGNOSIS — Z85820 Personal history of malignant melanoma of skin: Secondary | ICD-10-CM | POA: Diagnosis not present

## 2014-10-17 DIAGNOSIS — E1142 Type 2 diabetes mellitus with diabetic polyneuropathy: Secondary | ICD-10-CM | POA: Diagnosis not present

## 2014-10-17 DIAGNOSIS — I10 Essential (primary) hypertension: Secondary | ICD-10-CM | POA: Diagnosis not present

## 2014-10-17 DIAGNOSIS — R110 Nausea: Secondary | ICD-10-CM | POA: Diagnosis not present

## 2014-10-17 DIAGNOSIS — R5382 Chronic fatigue, unspecified: Secondary | ICD-10-CM | POA: Diagnosis not present

## 2014-10-17 DIAGNOSIS — E039 Hypothyroidism, unspecified: Secondary | ICD-10-CM | POA: Diagnosis not present

## 2014-10-17 DIAGNOSIS — M797 Fibromyalgia: Secondary | ICD-10-CM | POA: Diagnosis not present

## 2014-10-17 DIAGNOSIS — K219 Gastro-esophageal reflux disease without esophagitis: Secondary | ICD-10-CM | POA: Diagnosis not present

## 2014-10-17 DIAGNOSIS — M5414 Radiculopathy, thoracic region: Secondary | ICD-10-CM | POA: Diagnosis not present

## 2014-10-18 ENCOUNTER — Telehealth: Payer: Self-pay | Admitting: Internal Medicine

## 2014-10-18 MED ORDER — AZELASTINE-FLUTICASONE 137-50 MCG/ACT NA SUSP
2.0000 | Freq: Every day | NASAL | Status: DC
Start: 2014-10-18 — End: 2017-08-21

## 2014-10-18 MED ORDER — MOMETASONE FURO-FORMOTEROL FUM 100-5 MCG/ACT IN AERO: 2.0000 | INHALATION_SPRAY | Freq: Two times a day (BID) | RESPIRATORY_TRACT | Status: DC

## 2014-10-18 MED ORDER — IPRATROPIUM-ALBUTEROL 0.5-2.5 (3) MG/3ML IN SOLN: 3.0000 mL | RESPIRATORY_TRACT | Status: DC | PRN

## 2014-10-18 MED ORDER — ALBUTEROL SULFATE HFA 108 (90 BASE) MCG/ACT IN AERS: 2.0000 | INHALATION_SPRAY | Freq: Four times a day (QID) | RESPIRATORY_TRACT | Status: DC | PRN

## 2014-10-18 NOTE — Telephone Encounter (Signed)
Pt will be starting to get her medications from [facility] order. Will need refills on Albuterol HFA, Dulera, Dymista and Duoneb. All prescriptions have been sent in. Nothing further was needed.

## 2014-10-19 ENCOUNTER — Other Ambulatory Visit: Payer: Self-pay

## 2014-10-19 MED ORDER — MOMETASONE FURO-FORMOTEROL FUM 100-5 MCG/ACT IN AERO: 2.0000 | INHALATION_SPRAY | Freq: Two times a day (BID) | RESPIRATORY_TRACT | Status: DC

## 2014-10-25 ENCOUNTER — Other Ambulatory Visit (HOSPITAL_COMMUNITY): Payer: Self-pay | Admitting: Family Medicine

## 2014-10-25 DIAGNOSIS — M5414 Radiculopathy, thoracic region: Secondary | ICD-10-CM

## 2014-10-31 ENCOUNTER — Other Ambulatory Visit: Payer: Self-pay | Admitting: *Deleted

## 2014-10-31 NOTE — Patient Instructions (Signed)
Diabetes and Exercise Exercising regularly is important. It is not just about losing weight. It has many health benefits, such as:  Improving your overall fitness, flexibility, and endurance.  Increasing your bone density.  Helping with weight control.  Decreasing your body fat.  Increasing your muscle strength.  Reducing stress and tension.  Improving your overall health. People with diabetes who exercise gain additional benefits because exercise:  Reduces appetite.  Improves the body's use of blood sugar (glucose).  Helps lower or control blood glucose.  Decreases blood pressure.  Helps control blood lipids (such as cholesterol and triglycerides).  Improves the body's use of the hormone insulin by:  Increasing the body's insulin sensitivity.  Reducing the body's insulin needs.  Decreases the risk for heart disease because exercising:  Lowers cholesterol and triglycerides levels.  Increases the levels of good cholesterol (such as high-density lipoproteins [HDL]) in the body.  Lowers blood glucose levels. YOUR ACTIVITY PLAN  Choose an activity that you enjoy and set realistic goals. Your health care provider or diabetes educator can help you make an activity plan that works for you. Exercise regularly as directed by your health care provider. This includes:  Performing resistance training twice a week such as push-ups, sit-ups, lifting weights, or using resistance bands.  Performing 150 minutes of cardio exercises each week such as walking, running, or playing sports.  Staying active and spending no more than 90 minutes at one time being inactive. Even short bursts of exercise are good for you. Three 10-minute sessions spread throughout the day are just as beneficial as a single 30-minute session. Some exercise ideas include:  Taking the dog for a walk.  Taking the stairs instead of the elevator.  Dancing to your favorite song.  Doing an exercise  video.  Doing your favorite exercise with a friend. RECOMMENDATIONS FOR EXERCISING WITH TYPE 1 OR TYPE 2 DIABETES   Check your blood glucose before exercising. If blood glucose levels are greater than 240 mg/dL, check for urine ketones. Do not exercise if ketones are present.  Avoid injecting insulin into areas of the body that are going to be exercised. For example, avoid injecting insulin into:  The arms when playing tennis.  The legs when jogging.  Keep a record of:  Food intake before and after you exercise.  Expected peak times of insulin action.  Blood glucose levels before and after you exercise.  The type and amount of exercise you have done.  Review your records with your health care provider. Your health care provider will help you to develop guidelines for adjusting food intake and insulin amounts before and after exercising.  If you take insulin or oral hypoglycemic agents, watch for signs and symptoms of hypoglycemia. They include:  Dizziness.  Shaking.  Sweating.  Chills.  Confusion.  Drink plenty of water while you exercise to prevent dehydration or heat stroke. Body water is lost during exercise and must be replaced.  Talk to your health care provider before starting an exercise program to make sure it is safe for you. Remember, almost any type of activity is better than none. Document Released: 10/04/2003 Document Revised: 11/28/2013 Document Reviewed: 12/21/2012 [employer] Patient Information 2015 [employer]. This information is not intended to replace advice given to you by your health care provider. Make sure you discuss any questions you have with your health care provider.  

## 2014-10-31 NOTE — Patient Outreach (Signed)
[facility]) Care Management   10/31/2014  Cheryl [name] 1965/08/23 [id number]  Cheryl [name] is an 49 y.o. female  Subjective: Prior documentation in [place] system. Routine home visit with patient, HIPAA verified. Pt reports to have MRI 11/02/14 thoracic spine and left side due to pain and numbness, has had pleurisy. Pt now getting medications from [facility] and saving money- $60/ 3 months supply. Pt wants to lose weight.  Filed Vitals:   10/31/14 1428  BP: 132/70  Pulse: 102  Resp: 18    Objective:   ROS  Physical Exam  Constitutional: She is oriented to person, place, and time. She appears well-developed and well-nourished.  Cardiovascular: Regular rhythm.   Respiratory: Breath sounds normal.  GI: Bowel sounds are normal.  Musculoskeletal: She exhibits no edema.  Neurological: She is alert and oriented to person, place, and time.  Numbness to left upper side, near rib cage.  Skin: Skin is warm and dry.    Current Medications:   Current Outpatient Prescriptions  Medication Sig Dispense Refill  . albuterol (PROVENTIL HFA;VENTOLIN HFA) 108 (90 BASE) MCG/ACT inhaler Inhale 2 puffs into the lungs every 6 (six) hours as needed for wheezing or shortness of breath. 3 Inhaler 1  . amitriptyline (ELAVIL) 50 MG tablet Take 50 mg by mouth at bedtime.    [name] atorvastatin (LIPITOR) 10 MG tablet Take 10 mg by mouth every evening.    . Azelastine-Fluticasone (DYMISTA) 137-50 MCG/ACT SUSP Place 2 sprays into the nose daily. 3 Bottle 1  . diazepam (VALIUM) 5 MG tablet Take 5 mg by mouth at bedtime.     [name] diltiazem (CARDIZEM CD) 240 MG 24 hr capsule Take 240 mg by mouth daily.      . DULoxetine (CYMBALTA) 60 MG capsule Take 60 mg by mouth daily.    [name] gabapentin (NEURONTIN) 800 MG tablet Take 800 mg by mouth 2 (two) times daily.     [name] HYDROcodone-acetaminophen (NORCO/VICODIN) 5-325 MG per tablet Take 1 tablet by mouth every 6 (six) hours as needed for moderate  pain.    [name] ibuprofen (ADVIL,MOTRIN) 800 MG tablet Take 1 tablet (800 mg total) by mouth every 8 (eight) hours as needed for moderate pain. 30 tablet 0  . insulin regular human CONCENTRATED (HUMULIN R) 500 UNIT/ML SOLN injection Inject 50-100 Units into the skin See admin instructions. Take 100 units (draw up to the 20 units mark on the insulin syringe to get actual dose of 100 units) three times a day 30 minutes before meals.  If does not eat and blood sugar is <250, take 50 units (draw up to the 10 units mark on the insulin syringe to get actual dose of 50 units).  If does not eat and blood sugar is >250, take 75 units (draw up to the 15 units mark on the insulin syringe to get actual dose of 75 units) .    . ipratropium-albuterol (DUONEB) 0.5-2.5 (3) MG/3ML SOLN Take 3 mLs by nebulization every 2 (two) hours as needed (shortness of breath). 1080 mL 1  . levothyroxine (SYNTHROID, LEVOTHROID) 100 MCG tablet Take 100 mcg by mouth daily before breakfast.    . mometasone-formoterol (DULERA) 100-5 MCG/ACT AERO Inhale 2 puffs into the lungs 2 (two) times daily. 3 Inhaler 3  . omeprazole (PRILOSEC) 40 MG capsule Take 40 mg by mouth daily.     . promethazine (PHENERGAN) 25 MG tablet Take 25 mg by mouth every 6 (six) hours as needed for nausea  or vomiting.    [name] tiZANidine (ZANAFLEX) 4 MG capsule Take 4 mg by mouth at bedtime. For muscle spasms    . Vitamin D, Ergocalciferol, (DRISDOL) 50000 UNITS CAPS Take 50,000 Units by mouth every 7 (seven) days. Takes on Wed     No current facility-administered medications for this visit.    Functional Status:   In your present state of health, do you have any difficulty performing the following activities: 10/31/2014 02/03/2014  Is the patient deaf or have difficulty hearing? N N  Hearing [employer] Y  Difficulty concentrating or making decisions Y Y  Walking or climbing stairs? N N  Doing errands, shopping? N Y    Fall/Depression Screening:    PHQ 2/9 Scores  10/31/2014  PHQ - 2 Score 2  PHQ- 9 Score 8    Assessment:  Pt feels blood sugar averages have been elevated due to having pleurisy and not feeling as well, also pain contributing.  Pt. Wants to work on losing weight and would like to exercise and continue working on diet.  Pt has not applied for food stamps yet and not sure she will qualify since her daughter is now working, pt has the application and states she will make appointment with department social services. RN CM reviewed medications with pt.  [place]        Patient Outreach from 10/31/2014 in [place] Problem One  knowledge deficit related to diabetes type 2   Care Plan for Problem One  Active   Interventions for Problem One Long Term Goal  RN CM reviewed foods high in carbohydrates and portion control.   THN Long Term Goal (31-90 days)  pt will maintain 7,14, 30 day average under 200 within 90 days.   THN Long Term Goal Start Date  10/31/14   THN CM Short Term Goal #1 (0-30 days)  pt will walk 2-3 times weekly within 30 days.   THN CM Short Term Goal #1 Start Date  10/31/14   THN CM Short Term Goal #2 (0-30 days)  pt will maintain 7, 14, 30 day averages below 200 within 30 days.   THN CM Short Term Goal #2 Start Date  10/31/14     Plan: follow up with home visit on 11/30/14. Continue diabetic teaching. Discuss physical activity. Assess blood sugar averages.  Jacqlyn Larsen [facility], [place] Coordinator [phone]

## 2014-11-02 ENCOUNTER — Ambulatory Visit (HOSPITAL_COMMUNITY)
Admission: RE | Admit: 2014-11-02 | Discharge: 2014-11-02 | Disposition: A | Discharge status: Home / Self Care | Payer: Commercial Managed Care - HMO | Source: Ambulatory Visit | Attending: Family Medicine | Admitting: Family Medicine

## 2014-11-02 DIAGNOSIS — R079 Chest pain, unspecified: Secondary | ICD-10-CM | POA: Insufficient documentation

## 2014-11-02 DIAGNOSIS — M47814 Spondylosis without myelopathy or radiculopathy, thoracic region: Secondary | ICD-10-CM | POA: Diagnosis not present

## 2014-11-02 DIAGNOSIS — M5124 Other intervertebral disc displacement, thoracic region: Secondary | ICD-10-CM | POA: Diagnosis not present

## 2014-11-02 DIAGNOSIS — M549 Dorsalgia, unspecified: Secondary | ICD-10-CM | POA: Insufficient documentation

## 2014-11-02 DIAGNOSIS — M5414 Radiculopathy, thoracic region: Secondary | ICD-10-CM

## 2014-11-17 ENCOUNTER — Telehealth: Payer: Self-pay | Admitting: Internal Medicine

## 2014-11-17 NOTE — Telephone Encounter (Signed)
LMTCB

## 2014-11-20 NOTE — Telephone Encounter (Signed)
She can ask pharmacy for Proventil HFA inhaler to use instead of Ventolin. Same script should cover this

## 2014-11-20 NOTE — Telephone Encounter (Signed)
Spoke with pt and advised of Dr Janee Morn recommendations.  Pt verbalized understanding .  Nothing further needed.

## 2014-11-20 NOTE — Telephone Encounter (Signed)
Pt states that Ventolin HFA is not working well.  Pt states that she was on Proair and this helped better but insurance did not cover it so they switched her to Ventolin.  Pt wanting to know if there is another that may work as good as [facility].   Please advise Dr [name]. Thanks.

## 2014-11-21 DIAGNOSIS — M5134 Other intervertebral disc degeneration, thoracic region: Secondary | ICD-10-CM | POA: Diagnosis not present

## 2014-11-21 DIAGNOSIS — M47812 Spondylosis without myelopathy or radiculopathy, cervical region: Secondary | ICD-10-CM | POA: Diagnosis not present

## 2014-11-21 DIAGNOSIS — M549 Dorsalgia, unspecified: Secondary | ICD-10-CM | POA: Diagnosis not present

## 2014-11-21 DIAGNOSIS — M546 Pain in thoracic spine: Secondary | ICD-10-CM | POA: Diagnosis not present

## 2014-11-21 DIAGNOSIS — M47814 Spondylosis without myelopathy or radiculopathy, thoracic region: Secondary | ICD-10-CM | POA: Diagnosis not present

## 2014-11-21 DIAGNOSIS — M5136 Other intervertebral disc degeneration, lumbar region: Secondary | ICD-10-CM | POA: Diagnosis not present

## 2014-11-21 DIAGNOSIS — M542 Cervicalgia: Secondary | ICD-10-CM | POA: Diagnosis not present

## 2014-11-21 DIAGNOSIS — M5030 Other cervical disc degeneration, unspecified cervical region: Secondary | ICD-10-CM | POA: Diagnosis not present

## 2014-11-27 ENCOUNTER — Telehealth: Payer: Self-pay | Admitting: Internal Medicine

## 2014-11-27 NOTE — Telephone Encounter (Signed)
Spoke with pt. Advised her that [facility] had an opening on 12/04/14 at 10:45am. States that she will take this appointment and call us if her symptoms get worse before then. Nothing further was needed.

## 2014-11-30 ENCOUNTER — Other Ambulatory Visit: Payer: Self-pay | Admitting: *Deleted

## 2014-11-30 ENCOUNTER — Encounter: Payer: Self-pay | Admitting: *Deleted

## 2014-11-30 NOTE — Patient Outreach (Signed)
[facility]) Care Management   11/30/2014  Cheryl [name] 1966-04-03 [id number]  Cheryl [name] is an 49 y.o. female  Subjective: Routine home visit with pt who reports she saw Dr. [name] at "got a pretty good report"  Insulin dosage changed slightly, had MRI to thoracic spine and followed up with neurologist, no changes made, no new findings.  Pt to see Dr. Maceo Pro 5/23 and Dr. [name] 5/9, pt has been able to pay copays, her mother assists and her daughter is now working and lives in home with pt,  Reports not sure if going to complete food stamp application or go to social services to reapply for medicaid, has case worker there and has spoken with her, pt feels she will not qualify and has been denied previously, pt still does not want assistance from LCSW citing she has had this service in the past and is familiar with resources and "knows what to do".  Objective:    Filed Vitals:   11/30/14 1410  BP: 160/84  Pulse: 104  Resp: 18  CBG 7 day average 200           14 day average 182           30 day average 180           90 day average 184 ROS  Physical Exam  Constitutional: She is oriented to person, place, and time. She appears well-developed and well-nourished.  Neck:  .  Cardiovascular: Regular rhythm.   Heart rate 104  Respiratory: Breath sounds normal.  GI: Soft. Bowel sounds are normal.  Musculoskeletal: She exhibits no edema.  Pt has limited range of motion left shoulder, arm area due to pain.  Neurological: She is alert and oriented to person, place, and time.  Psychiatric: She has a normal mood and affect. Her behavior is normal. Judgment and thought content normal.    Current Medications:   Current Outpatient Prescriptions  Medication Sig Dispense Refill  . albuterol (PROVENTIL HFA;VENTOLIN HFA) 108 (90 BASE) MCG/ACT inhaler Inhale 2 puffs into the lungs every 6 (six) hours as needed for wheezing or shortness of breath. 3 Inhaler 1  . atorvastatin  (LIPITOR) 10 MG tablet Take 10 mg by mouth every evening.    . Azelastine-Fluticasone (DYMISTA) 137-50 MCG/ACT SUSP Place 2 sprays into the nose daily. 3 Bottle 1  . diazepam (VALIUM) 5 MG tablet Take 5 mg by mouth at bedtime.     [name] diltiazem (CARDIZEM CD) 240 MG 24 hr capsule Take 240 mg by mouth daily.      . DULoxetine (CYMBALTA) 60 MG capsule Take 60 mg by mouth daily.    [name] gabapentin (NEURONTIN) 800 MG tablet Take 800 mg by mouth 2 (two) times daily.     [name] HYDROcodone-acetaminophen (NORCO/VICODIN) 5-325 MG per tablet Take 1 tablet by mouth every 6 (six) hours as needed for moderate pain.    [name] ibuprofen (ADVIL,MOTRIN) 800 MG tablet Take 1 tablet (800 mg total) by mouth every 8 (eight) hours as needed for moderate pain. 30 tablet 0  . insulin regular human CONCENTRATED (HUMULIN R) 500 UNIT/ML SOLN injection Inject 50-100 Units into the skin See admin instructions. 500 Unit/ML-  125 units (25 unit markings on U-100 insulin syringe) 30 mins before breakfast,  90 units (18 unit marking on syringe) before lunch and 20 units (4 unit markings syringe) before evening meal [place] TID  (skip if missing a meal).    [name] ipratropium-albuterol (DUONEB)  0.5-2.5 (3) MG/3ML SOLN Take 3 mLs by nebulization every 2 (two) hours as needed (shortness of breath). 1080 mL 1  . levothyroxine (SYNTHROID, LEVOTHROID) 100 MCG tablet Take 100 mcg by mouth daily before breakfast.    . mometasone-formoterol (DULERA) 100-5 MCG/ACT AERO Inhale 2 puffs into the lungs 2 (two) times daily. 3 Inhaler 3  . omeprazole (PRILOSEC) 40 MG capsule Take 40 mg by mouth daily.     . promethazine (PHENERGAN) 25 MG tablet Take 25 mg by mouth every 6 (six) hours as needed for nausea or vomiting.    [name] tiZANidine (ZANAFLEX) 4 MG capsule Take 4 mg by mouth at bedtime. For muscle spasms    . Vitamin D, Ergocalciferol, (DRISDOL) 50000 UNITS CAPS Take 50,000 Units by mouth every 7 (seven) days. Takes on Wed    . amitriptyline (ELAVIL) 50 MG tablet Take 50  mg by mouth at bedtime.     No current facility-administered medications for this visit.    Functional Status:   In your present state of health, do you have any difficulty performing the following activities: 10/31/2014 02/03/2014  Hearing? N N  Vision? N Y  Difficulty concentrating or making decisions? N Y  Walking or climbing stairs? Y Y  Dressing or bathing? N N  Doing errands, shopping? N Y    Fall/Depression Screening:    PHQ 2/9 Scores 10/31/2014  PHQ - 2 Score 2  PHQ- 9 Score 8    Assessment:  RN CM reviewed discharge plan with pt and will transfer pt care to [place] coach.  Faxed letter to primary MD Dr .Maceo Pro reporting today's vital signs, pt continues to have pain, elevated blood pressure at times, informed pt will be followed by health coach, faxed visit encounter as well.  [facility] CM Care Plan        Most Recent Value   Problem One    Care Plan Problem One  knowledge deficit related to diabetes type 2   Role Documenting the Problem One  Care Management Coordinator   EMMI for Problem One  No   THN CM Care Plan Problem One     Care Plan for Problem One  Active   Patient Has Long Term Goal?  Yes   THN Long Term Goal (31-90 days)  pt will maintain 7,14, 30 day average under 200 within 90 days.   THN Long Term Goal Start Date  10/31/14   Problem One Short Term Goals    Number of Short Term Goals for Problem One  Two, One   Short Term Goal #1    THN CM Short Term Goal #1 (0-30 days)  pt will walk 2-3 times weekly within 30 days.   THN CM Short Term Goal #1 Start Date  10/31/14   THN CM Short Term Goal #1 Met Date  -- [11/30/14- goal restarted]   Interventions for Short Term Goal #1  RN CM reviewed importance of walking several times per week and increasing by few minutes daily.   Short Term Goal #2    THN CM Short Term Goal #2 (0-30 days)  pt will maintain 7, 14, 30 day averages below 200 within 30 days.   THN CM Short Term Goal #2 Start Date  10/31/14   [facility] CM Short  Term Goal #2 Met Date  11/30/14   Interventions for Short Term Goal #2  RN CM reviewed importance of eating 3 meals per day and eating adequate protein, complex carbohydrates.  Short Term Goal #3    Short Term Goal #4    Short Tern Goal #5    Problem Two    Care Plan Problem Two  Alternation in activity related to back, left side pain [From [place] system]   Role Documenting the Problem Two  Care Management Coordinator   EMMI for Problem Two  No   THN CM Care Plan Problem Two    Care Plan for Problem Two  Active   Patient Has Long Term Goal Problem Two?  Yes   THN Long Term Goal (31-90) days  Pt will have adequately managed pain within 90 days   THN Long Term Goal Start Date  10/03/14   Problem Two Short Term Goals    Number of Short Term Goal for Problem Two  One   Short Term Goal #1    THN CM Short Term Goal #1 (0-30 days)  Pt will have effectively managed pain and increased mobility within 30 days   THN CM Short Term Goal #1 Start Date  10/03/14   Interventions for Short Term Goal #2   reviewed importance of taking pain medication as prescribed, having positive coping mechanisms to deal with stress, practice relaxation, reported pain to primary MD Dr. Maceo Pro (faxed letter 11/30/14)   Short Term Goal #2    Short Term Goal #3    Short Term Goal #4    Short Term Goal #5    Problem Three    Care Plan Problem Three  Pt having ongoing financial difficulties (refuses LCSW) [documentation from [employer] system]   Role Documenting the Problem Three  Care Management Coordinator   EMMI for Problem Three  No   THN CM Care Plan Problem Three    Care Plan for Problem Three  Active   Patient Has Long Term Goal for Problem Three?  Yes   THN Long Term Goal (31-90) days  Pt will verbalize financial situation improving and able to afford copays, pay all bills within 90 days.   THN Long Term Goal Start Date  10/03/14   Problem Three Short Term Goals    Number of Short Term Goals for Problem Three  One    Short Term Goal #1    THN CM Short Term Goal #1 (0-30 days)  Pt will go to local department of social services within 30 days and apply for medicaid and food stamps within 30 days   THN CM Short Term Goal #1 Start Date  10/03/14   [facility] CM Short Term Goal #1 Met Date  11/30/14 [pt no longer wants to work towards this goal.]   Interventions for Short Term Goal #1  talked with pt about going to social services to reapply for medicaid, food stamps, pt daughter now working and pt may not proceed forward with this.   Short Term Goal #2    Short Term Goal #3    Short Term Goal #4    Short Term Goal #5       Plan: transfer to [facility] RN health coach, sent In basket to [place] and requested assigned RN call for report. Assess CBG log, averages Assess pain, activity

## 2014-12-04 ENCOUNTER — Ambulatory Visit (INDEPENDENT_AMBULATORY_CARE_PROVIDER_SITE_OTHER): Payer: Commercial Managed Care - HMO | Admitting: Internal Medicine

## 2014-12-04 ENCOUNTER — Encounter: Payer: Self-pay | Admitting: Internal Medicine

## 2014-12-04 VITALS — BP 132/74 | HR 98 | Ht 62.0 in | Wt 234.0 lb

## 2014-12-04 DIAGNOSIS — R0683 Snoring: Secondary | ICD-10-CM | POA: Diagnosis not present

## 2014-12-04 DIAGNOSIS — R0789 Other chest pain: Secondary | ICD-10-CM

## 2014-12-04 DIAGNOSIS — J383 Other diseases of vocal cords: Secondary | ICD-10-CM | POA: Diagnosis not present

## 2014-12-04 DIAGNOSIS — J4540 Moderate persistent asthma, uncomplicated: Secondary | ICD-10-CM

## 2014-12-04 NOTE — Patient Instructions (Signed)
Order- ONOX on room air   Dx asthma with bronchitis

## 2014-12-04 NOTE — Progress Notes (Signed)
Subjective:    Patient ID: Cheryl [name], female    DOB: 06/06/1966, 49 y.o.   MRN: [id number]  Asthma Her past medical history is significant for asthma.   June 24, 2010- Allergic rhinitis, OSA, DM, GERD..........................[name] here  Nurse-CC: 4 month follow up visit-wheezing, few asthma attacks, sneezing(using nasal sprays). Sleep- waking up "alot" and other days sleeping all day and not able to wake up well enough.  Now on cefdinir for sinusitis. Sneezing all Fall. Had CT sinus at [facility]- "chronic sinusitis".  Mother reports fall 2 weeks ago- fell onto knees and hands when knees buckled. Cheryl [name] now when she stands. Evaluated at [facility]. Anticipate referral to [facility] neurology. Had flu vax. Still lives same house.  Continues CPAP. When too tight she got pressure marks- discussed. Not smoking. Getting divorced- looking at options.   August 28, 2010-Allergic rhinitis, Asthma, OSA, DM, GERD.............[name] here  Nurse-CC: Follow up per [name], FNP. Pt c/o chest pressure and tightness, pains in chest when breathing, cough with very little yellow mucus, increased SOB at rest and with activity x 1 wk.  CPAP 10- Usually using it all night every night. Has had to skip this week while ill.  Says she has been on abx since October for sinusitis- did CT sinus. Gets better then worse. In last 1-2 weeks, increased cough and wheeze , tussive soreness mid anterior and left lateral chest. CBC and CXR at [name]. Says 2 days ago WBC 14000, given nebs. Now on doxy 252m daily.  [facility].   11/11/10- Acute OV-Walk in  Complains of increased SOB, wheezing, dry cough, chest congestion, low grade temp x2days. Has a lot of sinus drainage, cough, post nasal drip and wheezing. Wears CPAP at night, no interference. Teeth hurt at  at times. Has fatigue and no energy.  Has been taking zyrtec without much help. Very dry in sinuses. Frontal headache.    12/27/10- Allergic rhinitis, Asthma, OSA, complicated by DM, GERD  Mother here After seeing TP in April got welll after 2 rounds of omnicef. Tongue feels funny. Diabetic control has not been good.  Breathing has been good.   07/08/11- 89YOF former smoker, followed for asthma, allergic rhinitis, OSA/ failed CPAP, complicated by DM, GERD.   Mother here and offers her observations.  Says she felt okay until yesterday. She couldn't walk well, was falling and had gone back to using her walker. They called 911 and they told her she was okay except glucose was high. Mother wonders about "inner ear"/vertigo. Treated with eardrops in October for otitis left ear. Has Bell's palsy off and on causing some weakness in the left lower face which other associates with episodes of viral illness such as bronchitis, or allergy. [name], [name], with a total of 3 rounds of antibiotics this fall.  She now says that cough began about the time she began ramipril from Dr KDwyane Dee    04/29/12- 89YOF former smoker, followed for asthma, allergic rhinitis, OSA/ failed CPAP, complicated by DM, GERD.  Daughter here.  Has had flu shot. Cough, wheezing x 4 days; states friend of hers passed last Friday with MRSA.  Recent cold with increased wheeze, cough, sweating, hoarseness. Reports temperature 101. Scant sputum was green and nasal discharge green  but no headache. 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  09/06/12- 54 YOF former smoker, followed for asthma, allergic rhinitis, OSA/ failed CPAP, complicated by DM, GERD.  Mother here.  Has had flu shot. FOLLOWS TKP:TWSFKC deep cough and wheezing; also states that [facility] came picked up CPAP machine due to Medicare and  stating patient not using it for 90 days. Daily wheeze using rescue inhaler or nebulizer to control.no major distress. We discussed ways to provide more stability. Diabetes and persistent thrush interfere with steroid choices. CXR 05/06/12 IMPRESSION:  No evidence of acute cardiopulmonary disease.  Original Report Authenticated By: [name], M.D.  12/01/2012 Acute OV  Complains of wheezing, chest tightness, dry cough, increased SOB - reports no improvement since last ov, worse this AM for 2 weeks .  Has been doing nebs 4x daily. No hemoptysis, n/v, orthopnea, edema, or chest pain.  No otc used. Used Tussionex with some help.  05/04/13  ER follow up  ER follow up , cough, wheezing for weeks with right sided pleuritic pain.  Patient went to the emergency room on October 7 for shortness, of breath and right-sided chest, and abdominal pain. Workup was unrevealing, with a negative. Chest x-ray, abdominal ultrasound and lab work Showed only minimally, elevated LFTs. Patient reports that she continues to have some pain. On her right rib area, especially, if she coughs. Patient describes a dry cough, and intermittent wheezing. She denies any discolored mucus or fever. Orthopnea, PND, leg swelling, or calf pain. Patient says she is currently being evaluated by neurology for her weakness, and muscle spasms.   Says cough is keeping her up at night .   05/31/13- 89 yoF46 YOF former smoker, followed for asthma, allergic rhinitis, OSA/ failed CPAP, complicated by DM, GERD.  Female friend here.   FOLLOWS FOR: has spasms in throat-getting choked up when eating or taking medications. Would like to try to get CPAP back-told that she is stopping breathing again. Had CPAP 11/ [facility]- taken away because of noncompliance Complains of "muscle spasms" in throat-chokes easily. This is episodic. She is seeing a neurologist at [facility] and is on Zanaflex. We discussed reflux precautions. Friend says she snores and stops  breathing. Sleeping in recliner because she says "hurt all over".  08/01/12- 78 yoF46 YOF former smoker, followed for asthma, allergic rhinitis, OSA/ failed CPAP, complicated by DM, GERD.  Mother here FOLLOWS FOR: had to repeat Sleep Study in December- She sleeps in a recliner to avoid back and leg pains. Muscle jerks bother her while sleeping. NPSG 07/06/13 WNL- AHI 4/ hr; PLMAs 2.6/ hr.  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  Mother here FOLLOWS FOR:can not tell a difference with just clonazepam; has had to use Zanaflex with clonazepam. Pt states she is waking up at least 2 times during the night having to use her rescue inhaler and neb tx. Also continues to use Dulera. Green sinus drainage with limited headache. Sleeps in a recliner. Brings DMV form.  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  Mother here FOLLOWS LEX:[id number] doing okay overall; continues to use inhalers often. Will need to get neb meds through [facility] (put order in to set up with them) "allergic to flu shot" Had upper respiratory infection with acute bronchitis  early November now resolved Daughter telling her again that she snores loudly. One sleep study was positive then the next one was negative. CPAP was taken away by DME  CT chest 05/31/14 IMPRESSION: No acute findings in the chest, abdomen, or pelvis. Specifically, no findings to explain the patient's history of right-sided body pain. Electronically Signed  By: Misty Stanley M.D.  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  Mother here FOLLOWS FOR: pt states she's had pleurisy since Feb 2016 with no improvement.  c/o sob, L sided  chest/abdominal discomfort, nonprod cough.  Had neg VQ and CXR, some disk disease on T-spine xray Chest pains described as occasionally mid sternum but usually along the left lower costal margin, increased by deep breath, twisting or walking. Relieved by ibuprofen. Not affected by meals. Chokes frequently. Some dry cough and wheeze. Sleep pattern is irregular. She tends to sleep. 20 minute intervals and snores. No longer using CPAP or oxygen. CXR 09/28/14 IMPRESSION: No active cardiopulmonary disease. Electronically Signed  By: Lahoma Crocker M.D.  On: 09/28/2014 16:35  Review of Systems- see HPI Constitutional:   No-   weight loss, night sweats, fevers, chills,+irregular sleep schedule, +fatigue, lassitude. HEENT:   No-headaches, difficulty swallowing, tooth/dental problems, +sore throat,       No-  sneezing, itching, +ear ache, nasal congestion, post nasal drip,  CV:  +chest pain, orthopnea, PND, swelling in lower extremities, anasarca,  dizziness, palpitations Resp: No- acute  shortness of breath with exertion or at rest.              No- productive cough,  + non-productive cough,  No- coughing up of blood.              No- change in color of mucus.   Skin: No-   rash or lesions. GI:  No-   heartburn, indigestion, abdominal pain, nausea, vomiting,  GU:  MS:  No-   joint pain or swelling.   Neuro-    +tremor Psych:  No- change in mood or affect. No depression or anxiety.  No memory loss.  Objective:   Physical Exam      OBJ- Physical Exam General- Alert, Oriented, Affect-appropriate, Distress- none acute. + overweight Skin- rash-none, lesions- none, excoriation- none Lymphadenopathy- none Head- atraumatic            Eyes- Gross vision intact, PERRLA, conjunctivae and secretions clear            Ears- Hearing, canals-normal            Nose-  +turbinate edema, no-Septal dev, mucus, polyps, erosion, perforation             Throat- Mallampati II , mucosa clear , drainage- none, tonsils-  atrophic, voice quality is hesitant/hoarse+ Neck- flexible , trachea midline, no stridor , thyroid nl, carotid no bruit Chest - symmetrical excursion , unlabored           Heart/CV- RRR , no murmur , no gallop  , no rub, nl s1 s2                           - JVD- none , edema- none, stasis changes- none, varices- none           Lung- clear to P&A, wheeze- none, cough+light , dullness-none, rub- none  Chest wall-  Abd-  Br/ Gen/ Rectal- Not done, not indicated Extrem- cyanosis- none, clubbing, none, atrophy- none, strength- nl Neuro- +hesitant speech, +intention tremor

## 2014-12-05 NOTE — Patient Outreach (Signed)
[facility]) Care Management  12/05/2014  Cheryl [name] 29-Jun-1966 [id number]   Received report from [facility] Nurse.  Primary goal is to continue to work with patient on diabetes goals and pain management goals.   Plan:  RN Health Coach will contact patient for telephone outreach to introduce self to patient.  [name], RN, MSN [place] Management [phone]

## 2014-12-06 ENCOUNTER — Other Ambulatory Visit: Payer: Self-pay

## 2014-12-06 ENCOUNTER — Encounter: Payer: Self-pay | Admitting: *Deleted

## 2014-12-06 NOTE — Patient Outreach (Signed)
[facility]) Care Management  12/06/2014  RAYLI WIEDERHOLD Jun 03, 1966 [id number]   12/05/14- Verbal report given and care transferred to [facility] RN health coach Jon Billings with emphasis on disease process diabetes, exercise and increasing activity, diet and reinforcement.

## 2014-12-06 NOTE — Patient Outreach (Signed)
[place] [facility]) Care Management  12/06/2014  CHERRISE OCCHIPINTI [birth year] [id number]   Telephone call to patient for introduction of health coach.  Unable to reach.  HIPPA compliant voice message left.   Plan: RNCM will attempt  telephone outreach within 1 week.    [name], RN, MSN [place] Management [phone]

## 2014-12-08 ENCOUNTER — Other Ambulatory Visit: Payer: Self-pay

## 2014-12-08 ENCOUNTER — Ambulatory Visit: Payer: Commercial Managed Care - HMO

## 2014-12-08 NOTE — Patient Outreach (Signed)
[name] [facility]) Care Management  12/08/2014  Cheryl [name] 1966-05-12 [id number]     Telephone call to patient to introduce health coach. No answer.  HIPAA compliant voice message left.     Plan: RN Health Coach will attempt outreach to patient within one week.     [name], RN, MSN [place] [phone]

## 2014-12-12 ENCOUNTER — Other Ambulatory Visit: Payer: Self-pay

## 2014-12-12 NOTE — Patient Outreach (Signed)
[facility]) Care Management  12/12/2014  AZARAH DACY 06/22/1966 [id number]    Telephone call to patient to introduce health coach and role.  Patient states she is doing ok. Patient reports her blood sugars are in the 200 range which she states is better.  Patient reports she is trying to eat better.  She reports she still does not have much appetite as she is still dealing with Pleurisy pain.  She states she is taking her pain medication as ordered and with pain medication usage pain is about a 4/10 in her chest area.  She states that her PCP is aware of her pain as she keeps him updated and she sees him on Monday 12-18-14.  Advised patient to talk with Dr. Maceo Pro about her pain on her visit next week.  She verbalized understanding.    Assessment: Patient needs ongoing support for diabetes self-management.   Plan: RN Health Coach will outreach to patient within one week to assess how doctor visit went.  Patient agrees to next outreach call.     [name], RN, MSN [place] [phone]

## 2014-12-18 ENCOUNTER — Telehealth: Payer: Self-pay | Admitting: Internal Medicine

## 2014-12-18 DIAGNOSIS — J4540 Moderate persistent asthma, uncomplicated: Secondary | ICD-10-CM

## 2014-12-18 NOTE — Telephone Encounter (Signed)
Pt is aware of results and order has been placed.    Pt also express via phone call that she was not feeling well overall-no appetite-able to keep liquids down; no desire for solid foods; diarrhea(bile only), and headaches. Pt stated she spoke with her PCP and has OV Tuesday 12-19-14 at 10:15am. Pt aware she needs to keep this appt.   Nothing more needed at this time.

## 2014-12-18 NOTE — Telephone Encounter (Signed)
Spoke with pt, states she checked her ketones after speaking to [place] earlier, states her ketones are moderate.  wanted to make sure Joellen Jersey knew of this. Also explained the 02 process as pt was unclear on how she was to receive her 02.  I advised that our PCC's faxed the order to [employer] about 90 minutes ago, and that after [employer] processes the order they will be contacting her directly to set up a delivery time and will go over in detail her equipment as it is delivered.    Nothing further needed. Forwarding to [place] just as fyi.

## 2014-12-18 NOTE — Telephone Encounter (Signed)
I spoke with patient-she stated she was aware of where the O2 would come from and how she would get it. She wanted to speak with me about how she was feeling and scared that [facility] would let the O2 go (not get) if she ends up in the hospital tomorrow after seeing her PCP tomorrow. Pt is aware that [facility] will call her number to arrange O2 set up and if she happens to get admitted to the hospital then they would wait for her to be discharged before the set up O2.   Nothing more needed at this time.

## 2014-12-19 ENCOUNTER — Telehealth: Payer: Self-pay | Admitting: Internal Medicine

## 2014-12-19 DIAGNOSIS — R1012 Left upper quadrant pain: Secondary | ICD-10-CM | POA: Diagnosis not present

## 2014-12-19 DIAGNOSIS — R197 Diarrhea, unspecified: Secondary | ICD-10-CM | POA: Diagnosis not present

## 2014-12-19 DIAGNOSIS — M797 Fibromyalgia: Secondary | ICD-10-CM | POA: Diagnosis not present

## 2014-12-19 DIAGNOSIS — J4540 Moderate persistent asthma, uncomplicated: Secondary | ICD-10-CM | POA: Diagnosis not present

## 2014-12-19 DIAGNOSIS — E1142 Type 2 diabetes mellitus with diabetic polyneuropathy: Secondary | ICD-10-CM | POA: Diagnosis not present

## 2014-12-19 DIAGNOSIS — R1031 Right lower quadrant pain: Secondary | ICD-10-CM | POA: Diagnosis not present

## 2014-12-19 NOTE — Telephone Encounter (Signed)
Spoke with pt .  She states that [facility] was supposed to call her today about delivering oxygen but she cant get in touch with anyone.  I spoke with [place] at [place] and she will have someone give pt a call regarding delivery.

## 2014-12-21 ENCOUNTER — Ambulatory Visit: Payer: Self-pay

## 2014-12-21 ENCOUNTER — Other Ambulatory Visit: Payer: Self-pay

## 2014-12-21 NOTE — Patient Outreach (Addendum)
[facility]) Care Management  [place]  12/21/2014   Cheryl [name] Sep 18, 1965 [id number]  Telephone call to patient for update.  Patient reports seeing Dr. Maceo Pro on Tuesday.  She reports injection given and it did help but she is back in pain and will be calling the office today for update as Dr. Claudius Sis told her they would try a long acting one.  Patient rates pain today at 8/10 in left shoulder area.  She states Dr. Maceo Pro thinks it is a costochondritis type of pain.  Patient reports she is taking her pain medication as ordered and does not like to take much pain medication.  Patient shares is on oxygen now at 2.5liters at night as she would awaken short of breath and that her sat's went down in the 80's.  Patient also stated she had some ketones in her urine and that Dr. Maceo Pro was aware but she states that she was dehydrated as well at that point.  She reports she has been drinking fluids and tested her ketones today and only had trace amounts.  Blood sugar today was 248 before eating.  She states her sugars are up and down and that A1c was 6.0 on last check. Discussed with patient importance of keeping sugars up and drinking and eating adequately.  She verbalized understanding.     Current Medications:  Current Outpatient Prescriptions  Medication Sig Dispense Refill  . albuterol (PROVENTIL HFA;VENTOLIN HFA) 108 (90 BASE) MCG/ACT inhaler Inhale 2 puffs into the lungs every 6 (six) hours as needed for wheezing or shortness of breath. 3 Inhaler 1  . atorvastatin (LIPITOR) 10 MG tablet Take 10 mg by mouth every evening.    . Azelastine-Fluticasone (DYMISTA) 137-50 MCG/ACT SUSP Place 2 sprays into the nose daily. 3 Bottle 1  . diazepam (VALIUM) 5 MG tablet Take 5 mg by mouth at bedtime.     [name] diltiazem (CARDIZEM CD) 240 MG 24 hr capsule Take 240 mg by mouth daily.      . DULoxetine (CYMBALTA) 60 MG capsule Take 60 mg by mouth daily.    [name] gabapentin (NEURONTIN) 800 MG  tablet Take 800 mg by mouth 2 (two) times daily.     [name] HYDROcodone-acetaminophen (NORCO/VICODIN) 5-325 MG per tablet Take 1 tablet by mouth every 6 (six) hours as needed for moderate pain.    [name] ibuprofen (ADVIL,MOTRIN) 800 MG tablet Take 1 tablet (800 mg total) by mouth every 8 (eight) hours as needed for moderate pain. 30 tablet 0  . insulin regular human CONCENTRATED (HUMULIN R) 500 UNIT/ML SOLN injection Inject 50-100 Units into the skin See admin instructions. 500 Unit/ML-  125 units (25 unit markings on U-100 insulin syringe) 30 mins before breakfast,  90 units (18 unit marking on syringe) before lunch and 20 units (4 unit markings syringe) before evening meal [place] TID  (skip if missing a meal).    [name] ipratropium-albuterol (DUONEB) 0.5-2.5 (3) MG/3ML SOLN Take 3 mLs by nebulization every 2 (two) hours as needed (shortness of breath). 1080 mL 1  . levothyroxine (SYNTHROID, LEVOTHROID) 100 MCG tablet Take 100 mcg by mouth daily before breakfast.    . mometasone-formoterol (DULERA) 100-5 MCG/ACT AERO Inhale 2 puffs into the lungs 2 (two) times daily. 3 Inhaler 3  . omeprazole (PRILOSEC) 40 MG capsule Take 40 mg by mouth daily.     . promethazine (PHENERGAN) 25 MG tablet Take 25 mg by mouth every 6 (six) hours as needed for nausea or  vomiting.    [name] tiZANidine (ZANAFLEX) 4 MG capsule Take 4 mg by mouth at bedtime. For muscle spasms    . Vitamin D, Ergocalciferol, (DRISDOL) 50000 UNITS CAPS Take 50,000 Units by mouth every 7 (seven) days. Takes on Wed     No current facility-administered medications for this visit.    Functional Status:  In your present state of health, do you have any difficulty performing the following activities: 10/31/2014 02/03/2014  Hearing? N N  Vision? N Y  Difficulty concentrating or making decisions? N Y  Walking or climbing stairs? Y Y  Dressing or bathing? N N  Doing errands, shopping? N Y    Fall/Depression Screening: PHQ 2/9 Scores 12/21/2014 10/31/2014  PHQ - 2 Score  0 2  PHQ- 9 Score - 8     THN CM Care Plan Problem One        Patient Outreach Telephone from 12/21/2014 in [place] Problem One  knowledge deficit related to diabetes type 2   Care Plan for Problem One  Active   THN Long Term Goal (31-90 days)  pt will maintain 7,14, 30 day average under 200 within 90 days.   THN Long Term Goal Start Date  10/31/14   Interventions for Problem One Long Term Goal  Discussed with patient diabetic diet and importance of exercise.   THN CM Short Term Goal #1 (0-30 days)  pt will walk 2-3 times weekly within 30 days.   THN CM Short Term Goal #1 Start Date  10/31/14 [goal date restarted]   THN CM Short Term Goal #1 Met Date  -- [12-21-14 goal restarted]   Interventions for Short Term Goal #1  RN CM reviewed importance of walking several times per week and increasing by few minutes daily.   THN CM Short Term Goal #2 (0-30 days)  pt will maintain 7, 14, 30 day averages below 200 within 30 days.   THN CM Short Term Goal #2 Start Date  10/31/14   [facility] CM Short Term Goal #2 Met Date  11/30/14   Interventions for Short Term Goal #2  RN CM reviewed importance of eating 3 meals per day and eating adequate protein, complex carbohydrates.    [place] Plan Problem Two        Patient Outreach Telephone from 12/21/2014 in [place] Problem Two  Alternation in activity related to back, left side pain [From [employer] system]   Care Plan for Problem Two  Active   Interventions for Problem Two Long Term Goal   Discussed pain management options with patient.  Encouraged patient to discuss pain issues with physician.   THN Long Term Goal (31-90) days  Pt will have adequately managed pain within 90 days   THN Long Term Goal Start Date  10/03/14 Barrie Folk restarted 12-21-14]   THN Long Term Goal Met Date  -- [goal restarted 12-21-14]   THN CM Short Term Goal #1 (0-30 days)  Pt will have effectively managed pain and increased mobility  within 30 days   THN CM Short Term Goal #1 Start Date  10/03/14   THN CM Short Term Goal #1 Met Date   -- [goal restarted 12-21-14]   Interventions for Short Term Goal #2   reviewed importance of taking pain medication as prescribed, having positive coping mechanisms to deal with stress, practice relaxation.    [facility] CM Care Plan Problem Three  Patient Outreach Telephone from 12/21/2014 in [place] Problem Three  Pt having ongoing financial difficulties (refuses LCSW) [documentation from [employer] system]   Care Plan for Problem Three  Active   THN Long Term Goal (31-90) days  Pt will verbalize financial situation improving and able to afford copays, pay all bills within 90 days.   THN Long Term Goal Start Date  10/03/14   THN CM Short Term Goal #1 (0-30 days)  Pt will go to local department of social services within 30 days and apply for medicaid and food stamps within 30 days   THN CM Short Term Goal #1 Start Date  10/03/14   [facility] CM Short Term Goal #1 Met Date  11/30/14 [pt no longer wants to work towards this goal.]   Interventions for Short Term Goal #1  talked with pt about going to social services to reapply for medicaid, food stamps, pt daughter now working and pt may not proceed forward with this.        Assessment: Patient continues to need support and education regarding diabetes and pain management.  Plan: RN Health Coach will contact patient within the next month and patient agrees to next outreach.   [name], RN, MSN [place] [phone]

## 2014-12-25 DIAGNOSIS — R0789 Other chest pain: Secondary | ICD-10-CM | POA: Insufficient documentation

## 2014-12-25 NOTE — Assessment & Plan Note (Signed)
Lingering mild cough and reported wheeze consistent with mild intermittent uncomplicated asthmatic bronchitis Plan-discussed inhaler use

## 2014-12-25 NOTE — Assessment & Plan Note (Signed)
She considers occasional substernal pains and occasional left lower costal margin pains as being related. She does not describe a cardiac pattern and I think they're mostly chest wall discomforts with also possibly a GI source at times. There seems to be no pattern and no progression plan-education and observation.

## 2014-12-25 NOTE — Assessment & Plan Note (Signed)
I suggested we check overnight oximetry as a way to assess her breathing during sleep, before ordering yet another sleep study

## 2014-12-25 NOTE — Assessment & Plan Note (Signed)
This is more often a cause of her symptoms than actual bronchospasm. Mother is very attentive and focuses on symptoms.

## 2014-12-28 ENCOUNTER — Ambulatory Visit: Payer: Self-pay | Admitting: Internal Medicine

## 2014-12-28 ENCOUNTER — Other Ambulatory Visit (HOSPITAL_COMMUNITY): Payer: Self-pay | Admitting: Family Medicine

## 2014-12-28 DIAGNOSIS — R0789 Other chest pain: Secondary | ICD-10-CM

## 2014-12-28 DIAGNOSIS — L600 Ingrowing nail: Secondary | ICD-10-CM | POA: Diagnosis not present

## 2014-12-28 DIAGNOSIS — Z85820 Personal history of malignant melanoma of skin: Secondary | ICD-10-CM

## 2015-01-02 ENCOUNTER — Encounter: Payer: Self-pay | Admitting: Internal Medicine

## 2015-01-04 ENCOUNTER — Encounter (HOSPITAL_COMMUNITY): Payer: Self-pay

## 2015-01-04 ENCOUNTER — Ambulatory Visit (HOSPITAL_COMMUNITY)
Admission: RE | Admit: 2015-01-04 | Discharge: 2015-01-04 | Disposition: A | Discharge status: Home / Self Care | Payer: Commercial Managed Care - HMO | Source: Ambulatory Visit | Attending: Family Medicine | Admitting: Family Medicine

## 2015-01-04 DIAGNOSIS — R0789 Other chest pain: Secondary | ICD-10-CM | POA: Insufficient documentation

## 2015-01-04 DIAGNOSIS — Z85820 Personal history of malignant melanoma of skin: Secondary | ICD-10-CM | POA: Diagnosis not present

## 2015-01-04 DIAGNOSIS — R079 Chest pain, unspecified: Secondary | ICD-10-CM | POA: Diagnosis not present

## 2015-01-04 DIAGNOSIS — M549 Dorsalgia, unspecified: Secondary | ICD-10-CM | POA: Diagnosis not present

## 2015-01-09 ENCOUNTER — Other Ambulatory Visit: Payer: Self-pay

## 2015-01-09 NOTE — Patient Outreach (Signed)
[facility]) Care Management  [place]  01/09/2015   Cheryl [name] July 05, 1966 [id number]  Telephone call to patient she reports she is doing ok today.  Out earlier today with her mom.  Patient reports pain right now 8/10 as she has been out and more active but will be taking something for pain.  Patient states she will see her primary doctor on Tuesday and will discuss pain options as she has been in a lot of pain.  Patient mentioned something about Dr. [name] doing more testing but not sure what it will be.  Patient seems to think that her pain is related to her Fibromyalgia.  Discussed with patient possibility of seeing pain management to help manage her pain. She states she has seen pain management before and will mention it to Dr. Maceo Pro on next visit.  She reports sugars sometimes in the low 200's, which she states is better than before. Discussed trying to continue diabetic control.  Patient has not been exercising much due to pain.  Patient down today talking about her illness and sometimes the lack of support from her mother in dealing with her illnesses. Encouraged patient to continue to strive to do her best and stay encouraged.  She verbalized understanding.      Current Medications:  Current Outpatient Prescriptions  Medication Sig Dispense Refill  . albuterol (PROVENTIL HFA;VENTOLIN HFA) 108 (90 BASE) MCG/ACT inhaler Inhale 2 puffs into the lungs every 6 (six) hours as needed for wheezing or shortness of breath. 3 Inhaler 1  . atorvastatin (LIPITOR) 10 MG tablet Take 10 mg by mouth every evening.    . Azelastine-Fluticasone (DYMISTA) 137-50 MCG/ACT SUSP Place 2 sprays into the nose daily. 3 Bottle 1  . diazepam (VALIUM) 5 MG tablet Take 5 mg by mouth at bedtime.     [name] diltiazem (CARDIZEM CD) 240 MG 24 hr capsule Take 240 mg by mouth daily.      . DULoxetine (CYMBALTA) 60 MG capsule Take 60 mg by mouth daily.    [name] gabapentin (NEURONTIN) 800 MG tablet Take  800 mg by mouth 2 (two) times daily.     [name] HYDROcodone-acetaminophen (NORCO/VICODIN) 5-325 MG per tablet Take 1 tablet by mouth every 6 (six) hours as needed for moderate pain.    [name] ibuprofen (ADVIL,MOTRIN) 800 MG tablet Take 1 tablet (800 mg total) by mouth every 8 (eight) hours as needed for moderate pain. 30 tablet 0  . insulin regular human CONCENTRATED (HUMULIN R) 500 UNIT/ML SOLN injection Inject 50-100 Units into the skin See admin instructions. 500 Unit/ML-  125 units (25 unit markings on U-100 insulin syringe) 30 mins before breakfast,  90 units (18 unit marking on syringe) before lunch and 20 units (4 unit markings syringe) before evening meal English TID  (skip if missing a meal).    [name] ipratropium-albuterol (DUONEB) 0.5-2.5 (3) MG/3ML SOLN Take 3 mLs by nebulization every 2 (two) hours as needed (shortness of breath). 1080 mL 1  . levothyroxine (SYNTHROID, LEVOTHROID) 100 MCG tablet Take 100 mcg by mouth daily before breakfast.    . mometasone-formoterol (DULERA) 100-5 MCG/ACT AERO Inhale 2 puffs into the lungs 2 (two) times daily. 3 Inhaler 3  . omeprazole (PRILOSEC) 40 MG capsule Take 40 mg by mouth daily.     . potassium chloride (K-DUR,KLOR-CON) 10 MEQ tablet Take 10 mEq by mouth once.    . promethazine (PHENERGAN) 25 MG tablet Take 25 mg by mouth every 6 (six) hours  as needed for nausea or vomiting.    [name] tiZANidine (ZANAFLEX) 4 MG capsule Take 4 mg by mouth at bedtime. For muscle spasms    . Vitamin D, Ergocalciferol, (DRISDOL) 50000 UNITS CAPS Take 50,000 Units by mouth every 7 (seven) days. Takes on Wed     No current facility-administered medications for this visit.    Functional Status:  In your present state of health, do you have any difficulty performing the following activities: 01/09/2015 10/31/2014  Hearing? N N  Vision? - N  Difficulty concentrating or making decisions? N N  Walking or climbing stairs? Y Y  Dressing or bathing? N N  Doing errands, shopping? Y N  Preparing  Food and eating ? N -  Using the Toilet? N -  In the past six months, have you accidently leaked urine? N -  Do you have problems with loss of bowel control? N -  Managing your Medications? Y -  Managing your Finances? N -  Housekeeping or managing your Housekeeping? Y -    Fall/Depression Screening: PHQ 2/9 Scores 12/21/2014 10/31/2014  PHQ - 2 Score 0 2  PHQ- 9 Score - 8   THN CM Care Plan Problem One        Patient Outreach Telephone from 01/09/2015 in [place] Problem One  knowledge deficit related to diabetes type 2   Care Plan for Problem One  Active   THN Long Term Goal (31-90 days)  pt will maintain 7,14, 30 day average under 200 within 90 days.   THN Long Term Goal Start Date  01/09/15 [goal restarted]   Interventions for Problem One Long Term Goal  Discussed with patient diabetic diet and importance of exercise.   THN CM Short Term Goal #1 (0-30 days)  pt will walk 2-3 times weekly within 30 days.   THN CM Short Term Goal #1 Start Date  01/09/15 [goal restarted]   Interventions for Short Term Goal #1  RN CM reviewed importance of walking several times per week and increasing by few minutes daily.   THN CM Short Term Goal #2 (0-30 days)  pt will maintain 7, 14, 30 day averages below 200 within 30 days.   THN CM Short Term Goal #2 Start Date  10/31/14   [facility] [place] CM Short Term Goal #2 Met Date  11/30/14   Interventions for Short Term Goal #2  RN CM reviewed importance of eating 3 meals per day and eating adequate protein, complex carbohydrates.    [place] Plan Problem Two        Patient Outreach Telephone from 01/09/2015 in [place] Problem Two  Alternation in activity related to back, left side pain [From [employer] system]   Care Plan for Problem Two  Active   Interventions for Problem Two Long Term Goal   Discussed pain management options with patient.  Encouraged patient to discuss pain issues with physician.   THN Long Term  Goal (31-90) days  Pt will have adequately managed pain within 90 days   THN Long Term Goal Start Date  01/09/15 [goal restarted]   THN CM Short Term Goal #1 (0-30 days)  Pt will have effectively managed pain and increased mobility within 30 days   THN CM Short Term Goal #1 Start Date  01/09/15 [goal restarted]   Interventions for Short Term Goal #2   reviewed importance of taking pain medication as prescribed, having positive coping mechanisms  to deal with stress, practice relaxation.    [place] Problem Three        Patient Outreach Telephone from 01/09/2015 in [place] Problem Three  Pt having ongoing financial difficulties (refuses LCSW) [documentation from [employer] system]   Care Plan for Problem Three  Not Active   THN Long Term Goal (31-90) days  Pt will verbalize financial situation improving and able to afford copays, pay all bills within 90 days.   THN Long Term Goal Start Date  10/03/14   THN Long Term Goal Met Date  11/30/14   [facility]N CM Short Term Goal #1 (0-30 days)  Pt will go to local department of social services within 30 days and apply for medicaid and food stamps within 30 days   THN CM Short Term Goal #1 Start Date  10/03/14   [facility] CM Short Term Goal #1 Met Date  11/30/14 [pt no longer wants to work towards this goal.]   Interventions for Short Term Goal #1  talked with pt about going to social services to reapply for medicaid, food stamps, pt daughter now working and pt may not proceed forward with this.         Assessment: Patient will continue to benefit from [place] for education and support for diabetes self management.    Plan: RN Health Coach to send update to PCP for review.  RN Health Coach will outreach to patient within the next month and patient agrees to next outreach.  [name], RN, MSN [place] [phone]

## 2015-01-16 DIAGNOSIS — E1142 Type 2 diabetes mellitus with diabetic polyneuropathy: Secondary | ICD-10-CM | POA: Diagnosis not present

## 2015-01-16 DIAGNOSIS — G4761 Periodic limb movement disorder: Secondary | ICD-10-CM | POA: Diagnosis not present

## 2015-01-16 DIAGNOSIS — R0789 Other chest pain: Secondary | ICD-10-CM | POA: Diagnosis not present

## 2015-01-16 DIAGNOSIS — M797 Fibromyalgia: Secondary | ICD-10-CM | POA: Diagnosis not present

## 2015-01-19 DIAGNOSIS — J4540 Moderate persistent asthma, uncomplicated: Secondary | ICD-10-CM | POA: Diagnosis not present

## 2015-01-25 DIAGNOSIS — B353 Tinea pedis: Secondary | ICD-10-CM | POA: Diagnosis not present

## 2015-01-25 DIAGNOSIS — L02611 Cutaneous abscess of right foot: Secondary | ICD-10-CM | POA: Diagnosis not present

## 2015-01-25 DIAGNOSIS — B351 Tinea unguium: Secondary | ICD-10-CM | POA: Diagnosis not present

## 2015-01-25 DIAGNOSIS — E119 Type 2 diabetes mellitus without complications: Secondary | ICD-10-CM | POA: Diagnosis not present

## 2015-02-02 ENCOUNTER — Encounter: Payer: Self-pay | Admitting: Cardiovascular Disease

## 2015-02-02 ENCOUNTER — Ambulatory Visit (INDEPENDENT_AMBULATORY_CARE_PROVIDER_SITE_OTHER): Payer: Commercial Managed Care - HMO | Admitting: Cardiovascular Disease

## 2015-02-02 VITALS — BP 160/110 | HR 116 | Ht 62.0 in | Wt 217.8 lb

## 2015-02-02 DIAGNOSIS — R000 Tachycardia, unspecified: Secondary | ICD-10-CM | POA: Diagnosis not present

## 2015-02-02 LAB — SEDIMENTATION RATE: Sed Rate: 20 mm/hr (ref 0–22)

## 2015-02-02 LAB — TSH: TSH: 2.16 u[IU]/mL (ref 0.35–4.50)

## 2015-02-02 LAB — HEMATOCRIT: HCT: 42.2 % (ref 36.0–46.0)

## 2015-02-02 LAB — T4, FREE: Free T4: 0.81 ng/dL (ref 0.60–1.60)

## 2015-02-02 LAB — HEMOGLOBIN: Hemoglobin: 14.3 g/dL (ref 12.0–15.0)

## 2015-02-02 NOTE — Patient Instructions (Signed)
Medication Instructions:  NO CHANGES  Labwork: TODAY   TSH ,FREE T4 , HGB, HCT,  AND  SED RATE   Testing/Procedures: Your physician has requested that you have an echocardiogram. Echocardiography is a painless test that uses sound waves to create images of your heart. It provides your doctor with information about the size and shape of your heart and how well your heart's chambers and valves are working. This procedure takes approximately one hour. There are no restrictions for this procedure.    Follow-Up: Your physician recommends that you schedule a follow-up appointment in:  NEXT AVAILABLE WITH  DR Johnsie Cancel  Any Other Special Instructions Will Be Listed Below (If Applicable).

## 2015-02-02 NOTE — Progress Notes (Signed)
Patient ID: Cheryl [name], female   DOB: Dec 08, 1965, 49 y.o.   MRN: [id number]     Cardiology Office Note   Date:  02/02/2015   ID:  Cheryl [name], DOB 1965-11-28, MRN [id number]  PCP:  Cheryl Miyamoto, MD  Cardiologist:   Cheryl Rouge, MD   No chief complaint on file.     History of Present Illness: Cheryl [name] is a 49 y.o. female who presents for evaluation of atypical chest pain.  Sees Dr Cheryl [name] and complained of sharp pain under left breast in May.  She is a former smoker with asthma, OSA and COPD on oxygen She indicates being on ventolin and deulera but they don't help/  No active wheezing per her history.  Her pain is atypical sharp along left breast and side.  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  She sees Cheryl [name] for thyroid and DM  On replacement   Reviewed myovue 2008 normal with EF 62%   02/03/14 TSH .7   Past Medical History  Diagnosis Date  . GERD (gastroesophageal reflux disease)   . Chronic airway obstruction, not elsewhere classified   . Asthma   . Hyperlipidemia   . Hypothyroidism   . Depression   . Gastroparesis     from DM and chronic narcotic use  . DDD (degenerative disc disease)     CERVIAL AND LUMBAR  . Edema   . Human parvovirus infection   . 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 Fatty liver disease, nonalcoholic   . Positive PPD   . Vitamin D deficiency   . Vocal cord dysfunction   . PONV (postoperative nausea and vomiting)   . Hypertension   . Shortness of breath     "at anytime; it's gotten worse recently" (02/09/2013)  . OSA (obstructive sleep apnea)     "have mask;; don't use it; no one came out to check it" (02/09/2013)  . Type II diabetes mellitus   . Fibromyalgia     "severe" (02/09/2013)  . Peripheral neuropathy     "severe"  (02/09/2013)  . Migraine headache     "weekly; worse lately" (02/09/2013)  . Chronic pain     "q where; herniated disc in my tailbone" (02/09/2013)  . Anxiety   . Interstitial cystitis     Past Surgical History  Procedure Laterality Date  . Abdominal hysterectomy  1993  . Cholecystectomy  ?1990  . Nasal sinus surgery  1986; 1990's    "i've had 3" (02/09/2013)  . Knee arthroscopy Left 1990's    "fell; clot behind knee cap had to be removed" (02/09/2013)  . Hip surgery Right 2002    "did something to the tibula band" (02/09/2013)  . Shoulder arthroscopy w/ rotator cuff repair Right     "had to go deep in rotator cuff" (02/09/2013)  . Anterior cervical decomp/discectomy fusion  2004  . Carpal tunnel release Bilateral ?1980's     Current Outpatient Prescriptions  Medication Sig Dispense Refill  . albuterol (PROVENTIL HFA;VENTOLIN HFA) 108 (90 BASE) MCG/ACT inhaler Inhale 2 puffs into the lungs every 6 (six) hours as needed for wheezing or shortness of breath. 3 Inhaler 1  . atorvastatin (LIPITOR) 10 MG tablet Take 10 mg by mouth every evening.    [name]  Azelastine-Fluticasone (DYMISTA) 137-50 MCG/ACT SUSP Place 2 sprays into the nose daily. 3 Bottle 1  . diazepam (VALIUM) 5 MG tablet Take 5 mg by mouth at bedtime.     [name] diltiazem (CARDIZEM CD) 240 MG 24 hr capsule Take 240 mg by mouth daily.      . DULoxetine (CYMBALTA) 60 MG capsule Take 60 mg by mouth daily.    [name] gabapentin (NEURONTIN) 800 MG tablet Take 800 mg by mouth 2 (two) times daily.     [name] HYDROcodone-acetaminophen (NORCO/VICODIN) 5-325 MG per tablet Take 1 tablet by mouth every 6 (six) hours as needed for moderate pain.    [name] ibuprofen (ADVIL,MOTRIN) 800 MG tablet Take 1 tablet (800 mg total) by mouth every 8 (eight) hours as needed for moderate pain. 30 tablet 0  . insulin regular human CONCENTRATED (HUMULIN R) 500 UNIT/ML SOLN injection Inject 50-100 Units into the skin See admin instructions. 500 Unit/ML-  125 units (25 unit markings  on U-100 insulin syringe) 30 mins before breakfast,  90 units (18 unit marking on syringe) before lunch and 20 units (4 unit markings syringe) before evening meal [place] TID  (skip if missing a meal).    [name] ipratropium-albuterol (DUONEB) 0.5-2.5 (3) MG/3ML SOLN Take 3 mLs by nebulization every 2 (two) hours as needed (shortness of breath). 1080 mL 1  . levothyroxine (SYNTHROID, LEVOTHROID) 100 MCG tablet Take 100 mcg by mouth daily before breakfast.    . mometasone-formoterol (DULERA) 100-5 MCG/ACT AERO Inhale 2 puffs into the lungs 2 (two) times daily. 3 Inhaler 3  . omeprazole (PRILOSEC) 40 MG capsule Take 40 mg by mouth daily.     . potassium chloride (K-DUR,KLOR-CON) 10 MEQ tablet Take 10 mEq by mouth once.    . promethazine (PHENERGAN) 25 MG tablet Take 25 mg by mouth every 6 (six) hours as needed for nausea or vomiting.    [name] tiZANidine (ZANAFLEX) 4 MG capsule Take 4 mg by mouth at bedtime. For muscle spasms    . Vitamin D, Ergocalciferol, (DRISDOL) 50000 UNITS CAPS Take 50,000 Units by mouth every 7 (seven) days. Takes on Wed     No current facility-administered medications for this visit.    Allergies:   Influenza vaccines; Iohexol; Levofloxacin; Nucynta; Pregabalin; Sulfonamide derivatives; Vilazodone hcl; Amoxicillin; Biaxin; Carbamazepine; Ciprofloxacin; Dilaudid; and Percocet    Social History:  The patient  reports that she quit smoking about 9 years ago. Her smoking use included Cigarettes. She has a .3 pack-year smoking history. She has never used smokeless tobacco. She reports that she does not drink alcohol or use illicit drugs.   Family History:  The patient's family history includes Coronary artery disease in her father.    ROS:  Please see the history of present illness.   Otherwise, review of systems are positive for none.   All other systems are reviewed and negative.    PHYSICAL EXAM: VS:  There were no vitals taken for this visit. , BMI There is no weight on file to  calculate BMI. Affect appropriate Chronically ill white female  HEENT: vocal cord instability  Neck supple with no adenopathy JVP normal no bruits no thyromegaly Lungs clear with no wheezing and good diaphragmatic motion Heart:  S1/S2 no murmur, no rub, gallop or click PMI normal Abdomen: benighn, BS positve, no tenderness, no AAA no bruit.  No HSM or HJR Distal pulses intact with no bruits No edema Neuro non-focal Skin warm and dry Scar LUE from melanoma surgery  No  muscular weakness    EKG: 11/2014  ST rate 113 poor R wave progression  02/02/15  ST rate 103 otherwise normal    Recent Labs: 02/03/2014: TSH 0.702 05/31/2014: ALT 42* 09/28/2014: BUN <5*; Creatinine, Ser 0.67; Hemoglobin 14.0; Platelets 373; Potassium 3.7; Sodium 140    Lipid Panel    Component Value Date/Time   CHOL * 02/26/2008 1826    442        ATP III CLASSIFICATION:  <200     mg/dL   Desirable  200-239  mg/dL   Borderline High  >=240    mg/dL   High   TRIG 1038 RESULTS CONFIRMED BY MANUAL DILUTION* 02/26/2008 1826   HDL 41 02/26/2008 1826   CHOLHDL 10.8 02/26/2008 1826   VLDL UNABLE TO CALCULATE IF TRIGLYCERIDE OVER 400 mg/dL 02/26/2008 1826   LDLCALC  02/26/2008 1826    UNABLE TO CALCULATE IF TRIGLYCERIDE OVER 400 mg/dL        Total Cholesterol/HDL:CHD Risk Coronary Heart Disease Risk Table                     Men   Women  1/2 Average Risk   3.4   3.3      Wt Readings from Last 3 Encounters:  12/04/14 106.142 kg (234 lb)  11/30/14 105.688 kg (233 lb)  10/31/14 106.595 kg (235 lb)      Other studies Reviewed: Additional studies/ records that were reviewed today include: Xrays, ECG and Epic notes.    ASSESSMENT AND PLAN:  1.  Chest Pain:  Atypical coronary calcium on CT not a lot.  Once issues with tachycardia resolved may benefit from [facility] but clinically this is non anginal pain 2. Tachycardia:  Etiology not clear check TSH and ESR/ Hct.  Echo to assess RV and LV function Use  of beta blocker may be problematic as she has "asthma" history although no active wheezing  ECG x 2 with confirmation of sinus tachycardia normal P wave morphology 3. OSA:  CPAP on home oxygen f/u pulmonary  4. Chol:   Cholesterol is at goal.  Continue current dose of statin and diet Rx.  No myalgias or side effects.  F/U  LFT's in 6 months. Lab Results  Component Value Date   LDLCALC  02/26/2008    UNABLE TO CALCULATE IF TRIGLYCERIDE OVER 400 mg/dL        Total Cholesterol/HDL:CHD Risk Coronary Heart Disease Risk Table                     Men   Women  1/2 Average Risk   3.4   3.3  F/U labs with primary LDL goal less than 100 with positive coronary  calcium             Current medicines are reviewed at length with the patient today.  The patient does not have concerns regarding medicines.  The following changes have been made:  no change  Labs/ tests ordered today include:  TSH/T4 Hct and ESR   Echo  No orders of the defined types were placed in this encounter.     Disposition:   FU with me next available      Signed, Cheryl Rouge, MD  02/02/2015 9:06 AM    [facility] [address], [place], [place]  [address] Phone: [phone]; Fax: [phone]

## 2015-02-06 ENCOUNTER — Other Ambulatory Visit: Payer: Self-pay

## 2015-02-06 NOTE — Patient Outreach (Addendum)
[facility]) Care Management  [place]  02/06/2015   Cheryl [name] 10-07-1965 [id number]  Telephone call to patient for monthly outreach.  Patient reports she is still dealing with chest pain.  She reports seeing Dr. Johnsie Cancel regarding her tachycardia and her chest pain. She also shares that her blood pressure was up when she went to see him and that it has been up today.  She reports that her blood pressure goes up and down  and that Dr. Maceo Pro is aware of her blood pressure fluctuations.  Patient recently saw podiatrist for ingrown toe nails and has a follow up on 7-14.16.  Patient also shares she is scheduled for an echocardiogram this month for follow up CAD.  Patient reports that her blood sugar has been up and down with the highest being 398 but patient admits to not eating the right things and skipping meals.  Discussed diabetes and how important her diet is.    Current Medications:  Current Outpatient Prescriptions  Medication Sig Dispense Refill  . ACCU-CHEK FASTCLIX LANCETS MISC     . ACCU-CHEK SMARTVIEW test strip     . albuterol (PROVENTIL HFA;VENTOLIN HFA) 108 (90 BASE) MCG/ACT inhaler Inhale 2 puffs into the lungs every 6 (six) hours as needed for wheezing or shortness of breath. 3 Inhaler 1  . atorvastatin (LIPITOR) 10 MG tablet Take 10 mg by mouth every evening.    . Azelastine-Fluticasone (DYMISTA) 137-50 MCG/ACT SUSP Place 2 sprays into the nose daily. 3 Bottle 1  . BD INSULIN SYRINGE ULTRAFINE 31G X 5/16" 0.3 ML MISC     . diazepam (VALIUM) 5 MG tablet Take 5 mg by mouth at bedtime.     . Diclofenac Sodium 3 % GEL Apply 1 application topically as needed. For pain    . diltiazem (CARDIZEM CD) 240 MG 24 hr capsule Take 240 mg by mouth daily.      . DULoxetine (CYMBALTA) 60 MG capsule Take 60 mg by mouth daily.    [name] gabapentin (NEURONTIN) 800 MG tablet Take 800 mg by mouth 2 (two) times daily.     [name] HYDROcodone-acetaminophen (NORCO) 7.5-325 MG per  tablet Take 2 tablets by mouth daily.    [name] ibuprofen (ADVIL,MOTRIN) 800 MG tablet Take 1 tablet (800 mg total) by mouth every 8 (eight) hours as needed for moderate pain. 30 tablet 0  . insulin regular human CONCENTRATED (HUMULIN R) 500 UNIT/ML SOLN injection Inject 50-100 Units into the skin See admin instructions. 500 Unit/ML-  125 units (25 unit markings on U-100 insulin syringe) 30 mins before breakfast,  90 units (18 unit marking on syringe) before lunch and 20 units (4 unit markings syringe) before evening meal [place] TID  (skip if missing a meal).    [name] ipratropium-albuterol (DUONEB) 0.5-2.5 (3) MG/3ML SOLN Take 3 mLs by nebulization every 2 (two) hours as needed (shortness of breath). 1080 mL 1  . ketoconazole (NIZORAL) 2 % cream Apply 1 application topically as needed. For rashes    . levothyroxine (SYNTHROID, LEVOTHROID) 100 MCG tablet Take 100 mcg by mouth daily before breakfast.    . mometasone-formoterol (DULERA) 100-5 MCG/ACT AERO Inhale 2 puffs into the lungs 2 (two) times daily. 3 Inhaler 3  . omeprazole (PRILOSEC) 40 MG capsule Take 40 mg by mouth daily.     . promethazine (PHENERGAN) 25 MG tablet Take 25 mg by mouth every 6 (six) hours as needed for nausea or vomiting.    [name] tiZANidine (ZANAFLEX)  4 MG capsule Take 4 mg by mouth at bedtime. For muscle spasms    . Vitamin D, Ergocalciferol, (DRISDOL) 50000 UNITS CAPS Take 50,000 Units by mouth every 7 (seven) days. Takes on Wed    . potassium chloride (K-DUR,KLOR-CON) 10 MEQ tablet Take 10 mEq by mouth once.     No current facility-administered medications for this visit.    Functional Status:  In your present state of health, do you have any difficulty performing the following activities: 01/09/2015 10/31/2014  Hearing? N N  Vision? - N  Difficulty concentrating or making decisions? N N  Walking or climbing stairs? Y Y  Dressing or bathing? N N  Doing errands, shopping? Y N  Preparing Food and eating ? N -  Using the Toilet? N -  In  the past six months, have you accidently leaked urine? N -  Do you have problems with loss of bowel control? N -  Managing your Medications? Y -  Managing your Finances? N -  Housekeeping or managing your Housekeeping? Y -    Fall/Depression Screening: PHQ 2/9 Scores 02/06/2015 12/21/2014 10/31/2014  PHQ - 2 Score 2 0 2  PHQ- 9 Score 6 - 8   THN CM Care Plan Problem One        Patient Outreach Telephone from 02/06/2015 in [place] Problem One  knowledge deficit related to diabetes type 2   Care Plan for Problem One  Active   THN Long Term Goal (31-90 days)  pt will maintain 7,14, 30 day average under 200 within 90 days.   THN Long Term Goal Start Date  01/09/15 [goal restarted]   Interventions for Problem One Long Term Goal  Discussed with patient diabetic diet and importance of exercise. discussed with patient watching her carbohydrates and sweets.    THN CM Short Term Goal #1 (0-30 days)  pt will walk 2-3 times weekly within 30 days.   THN CM Short Term Goal #1 Start Date  02/06/15 [goal restarted]   Interventions for Short Term Goal #1  RN CM reviewed importance of walking several times per week and increasing by few minutes daily.  Discussed walking extra around the house as exercise.   THN CM Short Term Goal #2 (0-30 days)  pt will maintain 7, 14, 30 day averages below 200 within 30 days.   THN CM Short Term Goal #2 Start Date  10/31/14   [facility] CM Short Term Goal #2 Met Date  11/30/14   Interventions for Short Term Goal #2  RN CM reviewed importance of eating 3 meals per day and eating adequate protein, complex carbohydrates.    [place] Plan Problem Two        Patient Outreach Telephone from 02/06/2015 in [place] Problem Two  Alternation in activity related to back, left side pain [From [facility] system]   Care Plan for Problem Two  Active   Interventions for Problem Two Long Term Goal   Discussed pain management options with  patient.  Encouraged patient to discuss pain issues with physician.  Patient to follow up with cardiologist regarding  possible cardiac reason for pain and echocardiogram.   THN Long Term Goal (31-90) days  Pt will have adequately managed pain within 90 days   THN Long Term Goal Start Date  02/06/15 [goal restarted]   THN CM Short Term Goal #1 (0-30 days)  Pt will have effectively managed pain and  increased mobility within 30 days   THN CM Short Term Goal #1 Start Date  01/09/15 [goal restarted]   Interventions for Short Term Goal #2   Patient seeing cardiologists for chest pain. reviewed importance of taking pain medication as prescribed, having positive coping mechanisms to deal with stress, practice relaxation.    [place] Problem Three        Patient Outreach Telephone from 02/06/2015 in [place] Problem Three  Pt having ongoing financial difficulties (refuses LCSW) [documentation from [employer] system]   Care Plan for Problem Three  Not Active   THN Long Term Goal (31-90) days  Pt will verbalize financial situation improving and able to afford copays, pay all bills within 90 days.   THN Long Term Goal Start Date  10/03/14   THN Long Term Goal Met Date  11/30/14   THN CM Short Term Goal #1 (0-30 days)  Pt will go to local department of social services within 30 days and apply for medicaid and food stamps within 30 days   THN CM Short Term Goal #1 Start Date  10/03/14   [facility] CM Short Term Goal #1 Met Date  11/30/14 [pt no longer wants to work towards this goal.]   Interventions for Short Term Goal #1  talked with pt about going to social services to reapply for medicaid, food stamps, pt daughter now working and pt may not proceed forward with this.      Assessment: Patient will continue to benefit from health coach outreach for disease management  Plan: [place] will contact patient within the next month and patient agrees to next outreach. RN Health  Coach will send barrier letter and assessment to physician.   RN Health Coach will send  EMMI information to patient regarding diabetes.    [name], RN, MSN [place] [phone]

## 2015-02-08 DIAGNOSIS — B353 Tinea pedis: Secondary | ICD-10-CM | POA: Diagnosis not present

## 2015-02-08 DIAGNOSIS — M7661 Achilles tendinitis, right leg: Secondary | ICD-10-CM | POA: Diagnosis not present

## 2015-02-08 DIAGNOSIS — L03031 Cellulitis of right toe: Secondary | ICD-10-CM | POA: Diagnosis not present

## 2015-02-12 ENCOUNTER — Ambulatory Visit (HOSPITAL_COMMUNITY): Payer: Commercial Managed Care - HMO | Attending: Cardiology

## 2015-02-12 ENCOUNTER — Other Ambulatory Visit: Payer: Self-pay

## 2015-02-12 DIAGNOSIS — R000 Tachycardia, unspecified: Secondary | ICD-10-CM | POA: Insufficient documentation

## 2015-02-12 DIAGNOSIS — Z6839 Body mass index (BMI) 39.0-39.9, adult: Secondary | ICD-10-CM | POA: Diagnosis not present

## 2015-02-18 DIAGNOSIS — J4540 Moderate persistent asthma, uncomplicated: Secondary | ICD-10-CM | POA: Diagnosis not present

## 2015-02-22 DIAGNOSIS — B001 Herpesviral vesicular dermatitis: Secondary | ICD-10-CM | POA: Diagnosis not present

## 2015-02-22 DIAGNOSIS — M797 Fibromyalgia: Secondary | ICD-10-CM | POA: Diagnosis not present

## 2015-02-22 DIAGNOSIS — G894 Chronic pain syndrome: Secondary | ICD-10-CM | POA: Diagnosis not present

## 2015-03-01 DIAGNOSIS — L03031 Cellulitis of right toe: Secondary | ICD-10-CM | POA: Diagnosis not present

## 2015-03-01 DIAGNOSIS — M7661 Achilles tendinitis, right leg: Secondary | ICD-10-CM | POA: Diagnosis not present

## 2015-03-05 ENCOUNTER — Other Ambulatory Visit: Payer: Self-pay

## 2015-03-05 NOTE — Patient Outreach (Signed)
[place] [facility]) Care Management  [place]  03/05/2015   Cheryl [name] [birth year] [id number]  Monthly telephone call with patient.  She states doing so-so this morning.  She states she is having joint pain this am 9/10 but has not taken gabapentin today.  She states she does not really like to take it.  Advised on importance of taking medication as prescribed to help with pain. She verbalized understanding.  Patient had echocardiogram and results were normal per patient. Patient reports blood sugar being crazy. Reports some episodes of sugar dropping and staying low after eating crackers.  Discussed drinking orange juice with sugar first then eating crackers to maintain blood sugar.  She verbalized understanding.  Patient reported highest blood sugar 498.  Discussed diabetic diet and limiting sugars and carbohydrates.  She verbalized understanding.   Current Medications:  Current Outpatient Prescriptions  Medication Sig Dispense Refill  . ACCU-CHEK FASTCLIX LANCETS MISC     . ACCU-CHEK SMARTVIEW test strip     . albuterol (PROVENTIL HFA;VENTOLIN HFA) 108 (90 BASE) MCG/ACT inhaler Inhale 2 puffs into the lungs every 6 (six) hours as needed for wheezing or shortness of breath. 3 Inhaler 1  . atorvastatin (LIPITOR) 10 MG tablet Take 10 mg by mouth every evening.    . Azelastine-Fluticasone (DYMISTA) 137-50 MCG/ACT SUSP Place 2 sprays into the nose daily. 3 Bottle 1  . BD INSULIN SYRINGE ULTRAFINE 31G X 5/16" 0.3 ML MISC     . diazepam (VALIUM) 5 MG tablet Take 5 mg by mouth at bedtime.     [name] diltiazem (CARDIZEM CD) 240 MG 24 hr capsule Take 240 mg by mouth daily.      . DULoxetine (CYMBALTA) 60 MG capsule Take 60 mg by mouth daily.    [name] gabapentin (NEURONTIN) 800 MG tablet Take 800 mg by mouth 2 (two) times daily.     [name] HYDROcodone-acetaminophen (NORCO) 7.5-325 MG per tablet Take 2 tablets by mouth daily.    [name] ibuprofen (ADVIL,MOTRIN) 800 MG tablet Take 1 tablet (800 mg  total) by mouth every 8 (eight) hours as needed for moderate pain. 30 tablet 0  . insulin regular human CONCENTRATED (HUMULIN R) 500 UNIT/ML SOLN injection Inject 50-100 Units into the skin See admin instructions. 500 Unit/ML-  125 units (25 unit markings on U-100 insulin syringe) 30 mins before breakfast,  90 units (18 unit marking on syringe) before lunch and 20 units (4 unit markings syringe) before evening meal [place] TID  (skip if missing a meal).    [name] ipratropium-albuterol (DUONEB) 0.5-2.5 (3) MG/3ML SOLN Take 3 mLs by nebulization every 2 (two) hours as needed (shortness of breath). 1080 mL 1  . ketoconazole (NIZORAL) 2 % cream Apply 1 application topically as needed. For rashes    . levothyroxine (SYNTHROID, LEVOTHROID) 100 MCG tablet Take 100 mcg by mouth daily before breakfast.    . mometasone-formoterol (DULERA) 100-5 MCG/ACT AERO Inhale 2 puffs into the lungs 2 (two) times daily. 3 Inhaler 3  . omeprazole (PRILOSEC) 40 MG capsule Take 40 mg by mouth daily.     . potassium chloride (K-DUR,KLOR-CON) 10 MEQ tablet Take 10 mEq by mouth once.    . promethazine (PHENERGAN) 25 MG tablet Take 25 mg by mouth every 6 (six) hours as needed for nausea or vomiting.    [name] tiZANidine (ZANAFLEX) 4 MG capsule Take 4 mg by mouth at bedtime. For muscle spasms    . Vitamin D, Ergocalciferol, (DRISDOL) 50000 UNITS CAPS  Take 50,000 Units by mouth every 7 (seven) days. Takes on Wed    . Diclofenac Sodium 3 % GEL Apply 1 application topically as needed. For pain     No current facility-administered medications for this visit.    Functional Status:  In your present state of health, do you have any difficulty performing the following activities: 01/09/2015 10/31/2014  Hearing? N N  Vision? - N  Difficulty concentrating or making decisions? N N  Walking or climbing stairs? Y Y  Dressing or bathing? N N  Doing errands, shopping? Y N  Preparing Food and eating ? N -  Using the Toilet? N -  In the past six months,  have you accidently leaked urine? N -  Do you have problems with loss of bowel control? N -  Managing your Medications? Y -  Managing your Finances? N -  Housekeeping or managing your Housekeeping? Y -    Fall/Depression Screening: PHQ 2/9 Scores 03/05/2015 02/06/2015 12/21/2014 10/31/2014  PHQ - 2 Score 2 2 0 2  PHQ- 9 Score 7 6 - 8   THN CM Care Plan Problem One        Patient Outreach Telephone from 03/05/2015 in [place] Problem One  knowledge deficit related to diabetes type 2   Care Plan for Problem One  Active   THN Long Term Goal (31-90 days)  pt will maintain 7,14, 30 day average under 200 within 90 days.   THN Long Term Goal Start Date  03/05/15 [goal restarted]   Interventions for Problem One Long Term Goal  Reemphasized with patient diabetic diet and importance of exercise. Reemphasized with patient watching her carbohydrates and sweets.    THN CM Short Term Goal #1 (0-30 days)  pt will walk 2-3 times weekly within 30 days.   THN CM Short Term Goal #1 Start Date  03/05/15 [goal restarted]   Interventions for Short Term Goal #1  Discussed with patient importance of walking several times per week and increasing by few minutes daily.  Discussed walking extra around the house as exercise.   THN CM Short Term Goal #2 (0-30 days)  pt will maintain 7, 14, 30 day averages below 200 within 30 days.   THN CM Short Term Goal #2 Start Date  10/31/14   [facility] CM Short Term Goal #2 Met Date  11/30/14   Interventions for Short Term Goal #2  RN CM reviewed importance of eating 3 meals per day and eating adequate protein, complex carbohydrates.    [place] Plan Problem Two        Patient Outreach Telephone from 03/05/2015 in [place] Problem Two  Alternation in activity related to back, left side pain [From [facility] system]   Care Plan for Problem Two  Active   Interventions for Problem Two Long Term Goal   Discussed pain management and importance  of taking gabapentin for pain.  Patient discussed pain issues with physician.  Patient Completed echocardiogram-results normal.     THN Long Term Goal (31-90) days  Pt will have adequately managed pain within 90 days   THN Long Term Goal Start Date  02/06/15 [goal restarted]   THN CM Short Term Goal #1 (0-30 days)  Pt will have effectively managed pain and increased mobility within 30 days   THN CM Short Term Goal #1 Start Date  01/09/15 [goal restarted]   THN CM Short Term Goal #1  Met Date   03/05/15   Interventions for Short Term Goal #2   Patient seeing cardiologists for chest pain. reviewed importance of taking pain medication as prescribed, having positive coping mechanisms to deal with stress, practice relaxation.    [place] Problem Three        Patient Outreach Telephone from 03/05/2015 in [place] Problem Three  Pt having ongoing financial difficulties (refuses LCSW) [documentation from [employer] system]   Care Plan for Problem Three  Not Active   THN Long Term Goal (31-90) days  Pt will verbalize financial situation improving and able to afford copays, pay all bills within 90 days.   THN Long Term Goal Start Date  10/03/14   THN Long Term Goal Met Date  11/30/14   THN CM Short Term Goal #1 (0-30 days)  Pt will go to local department of social services within 30 days and apply for medicaid and food stamps within 30 days   THN CM Short Term Goal #1 Start Date  10/03/14   [facility] CM Short Term Goal #1 Met Date  11/30/14 [pt no longer wants to work towards this goal.]   Interventions for Short Term Goal #1  talked with pt about going to social services to reapply for medicaid, food stamps, pt daughter now working and pt may not proceed forward with this.       Assessment:  Patient continues to benefit from health coach calls for assistance with disease management.  Plan: RN Health Coach will contact patient within one month and patient agrees to next  outreach.    [name], RN, MSN [place] [phone]

## 2015-03-21 DIAGNOSIS — J4540 Moderate persistent asthma, uncomplicated: Secondary | ICD-10-CM | POA: Diagnosis not present

## 2015-03-29 ENCOUNTER — Ambulatory Visit (INDEPENDENT_AMBULATORY_CARE_PROVIDER_SITE_OTHER): Payer: Commercial Managed Care - HMO | Admitting: Cardiovascular Disease

## 2015-03-29 ENCOUNTER — Encounter: Payer: Self-pay | Admitting: Cardiovascular Disease

## 2015-03-29 VITALS — BP 150/88 | HR 119 | Ht 62.0 in | Wt 215.8 lb

## 2015-03-29 DIAGNOSIS — R0789 Other chest pain: Secondary | ICD-10-CM | POA: Diagnosis not present

## 2015-03-29 MED ORDER — NEBIVOLOL HCL 10 MG PO TABS: 10.0000 mg | ORAL_TABLET | Freq: Every day | ORAL | Status: DC

## 2015-03-29 NOTE — Patient Instructions (Signed)
Medication Instructions:  START  BYSTOLIC  10 MG EVERY DAY   Labwork: NONE  Testing/Procedures: NONE  Follow-Up: Your physician recommends that you schedule a follow-up appointment in: AS NEEDED   Any Other Special Instructions Will Be Listed Below (If Applicable).

## 2015-03-29 NOTE — Progress Notes (Signed)
Patient ID: Cheryl [name], female   DOB: [birth year], 49 y.o.   MRN: [id number]     Cardiology Office Note   Date:  03/29/2015   ID:  Cheryl [name], DOB September 26, 1965, MRN [id number]  PCP:  Abigail Miyamoto, MD  Cardiologist:   Jenkins Rouge, MD   No chief complaint on file.     History of Present Illness: Cheryl [name] is a 49 y.o. female who presents for evaluation of atypical chest pain.  Sees Dr [name] and complained of sharp pain under left breast in May.  She is a former smoker with asthma, OSA and COPD on oxygen She indicates being on ventolin and deulera but they don't help/  No active wheezing per her history.  Her pain is atypical sharp along left breast and side.  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  She sees [name] for thyroid and DM  On replacement   Reviewed myovue 2008 normal with EF 62%   F/U echo reviewed: normal byventricular function mild diastolic 1 disease no valve disease  Labs normal including HCT, TSH and ESR   Past Medical History  Diagnosis Date  . GERD (gastroesophageal reflux disease)   . Chronic airway obstruction, not elsewhere classified   . Asthma   . Hyperlipidemia   . Hypothyroidism   . Depression   . Gastroparesis     from DM and chronic narcotic use  . DDD (degenerative disc disease)     CERVIAL AND LUMBAR  . Edema   . Human parvovirus infection   . 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 Fatty liver disease, nonalcoholic   . Positive PPD   . Vitamin D deficiency   . Vocal cord dysfunction   . PONV (postoperative nausea and vomiting)   . Hypertension   . Shortness of breath     "at anytime; it's gotten worse recently" (02/09/2013)  . OSA (obstructive sleep apnea)     "have mask;; don't use it; no one came out to check it"  (02/09/2013)  . Type II diabetes mellitus   . Fibromyalgia     "severe" (02/09/2013)  . Peripheral neuropathy     "severe" (02/09/2013)  . Migraine headache     "weekly; worse lately" (02/09/2013)  . Chronic pain     "q where; herniated disc in my tailbone" (02/09/2013)  . Anxiety   . Interstitial cystitis     Past Surgical History  Procedure Laterality Date  . Abdominal hysterectomy  1993  . Cholecystectomy  ?1990  . Nasal sinus surgery  1986; 1990's    "i've had 3" (02/09/2013)  . Knee arthroscopy Left 1990's    "fell; clot behind knee cap had to be removed" (02/09/2013)  . Hip surgery Right 2002    "did something to the tibula band" (02/09/2013)  . Shoulder arthroscopy w/ rotator cuff repair Right     "had to go deep in rotator cuff" (02/09/2013)  . Anterior cervical decomp/discectomy fusion  2004  . Carpal tunnel release Bilateral ?1980's     Current Outpatient Prescriptions  Medication Sig Dispense Refill  . ACCU-CHEK FASTCLIX LANCETS MISC     . ACCU-CHEK SMARTVIEW test strip     . albuterol (PROVENTIL HFA;VENTOLIN HFA) 108 (90 BASE) MCG/ACT inhaler  Inhale 2 puffs into the lungs every 6 (six) hours as needed for wheezing or shortness of breath. 3 Inhaler 1  . atorvastatin (LIPITOR) 10 MG tablet Take 10 mg by mouth every evening.    . Azelastine-Fluticasone (DYMISTA) 137-50 MCG/ACT SUSP Place 2 sprays into the nose daily. 3 Bottle 1  . BD INSULIN SYRINGE ULTRAFINE 31G X 5/16" 0.3 ML MISC     . diazepam (VALIUM) 5 MG tablet Take 5 mg by mouth 2 (two) times daily as needed for anxiety.     . Diclofenac Sodium 3 % GEL Apply 1 application topically as needed. For pain    . diltiazem (CARDIZEM CD) 240 MG 24 hr capsule Take 240 mg by mouth daily.      . DULoxetine (CYMBALTA) 60 MG capsule Take 60 mg by mouth daily.    [name] gabapentin (NEURONTIN) 800 MG tablet Take 800 mg by mouth 2 (two) times daily.     [name] HYDROcodone-acetaminophen (NORCO) 7.5-325 MG per tablet Take 2 tablets by mouth  daily.    [name] ibuprofen (ADVIL,MOTRIN) 800 MG tablet Take 1 tablet (800 mg total) by mouth every 8 (eight) hours as needed for moderate pain. 30 tablet 0  . insulin regular human CONCENTRATED (HUMULIN R) 500 UNIT/ML SOLN injection Inject 50-100 Units into the skin See admin instructions. 500 Unit/ML-  125 units (25 unit markings on U-100 insulin syringe) 30 mins before breakfast,  90 units (18 unit marking on syringe) before lunch and 20 units (4 unit markings syringe) before evening meal [place] TID  (skip if missing a meal).    [name] ipratropium-albuterol (DUONEB) 0.5-2.5 (3) MG/3ML SOLN Take 3 mLs by nebulization every 2 (two) hours as needed (shortness of breath). 1080 mL 1  . ketoconazole (NIZORAL) 2 % cream Apply 1 application topically as needed. For rashes    . levothyroxine (SYNTHROID, LEVOTHROID) 100 MCG tablet Take 100 mcg by mouth daily before breakfast.    . mometasone-formoterol (DULERA) 100-5 MCG/ACT AERO Inhale 2 puffs into the lungs 2 (two) times daily. 3 Inhaler 3  . omeprazole (PRILOSEC) 40 MG capsule Take 40 mg by mouth daily.     . promethazine (PHENERGAN) 25 MG tablet Take 25 mg by mouth every 6 (six) hours as needed for nausea or vomiting.    [name] tiZANidine (ZANAFLEX) 4 MG capsule Take 4 mg by mouth at bedtime. For muscle spasms    . Vitamin D, Ergocalciferol, (DRISDOL) 50000 UNITS CAPS Take 50,000 Units by mouth every 7 (seven) days. Takes on Wed    . nebivolol (BYSTOLIC) 10 MG tablet Take 1 tablet (10 mg total) by mouth daily. 30 tablet 11  . nebivolol (BYSTOLIC) 10 MG tablet Take 1 tablet (10 mg total) by mouth daily. 30 tablet 11   No current facility-administered medications for this visit.    Allergies:   Influenza vaccines; Iohexol; Levofloxacin; Nucynta; Pregabalin; Sulfonamide derivatives; Vilazodone hcl; Amoxicillin; Biaxin; Carbamazepine; Ciprofloxacin; Dilaudid; and Percocet    Social History:  The patient  reports that she quit smoking about 9 years ago. Her smoking use  included Cigarettes. She has a .3 pack-year smoking history. She has never used smokeless tobacco. She reports that she does not drink alcohol or use illicit drugs.   Family History:  The patient's family history includes Coronary artery disease in her father.    ROS:  Please see the history of present illness.   Otherwise, review of systems are positive for none.   All other systems are reviewed  and negative.    PHYSICAL EXAM: VS:  BP 150/88 mmHg  Pulse 119  Ht 5' 2"  (1.575 m)  Wt 97.886 kg (215 lb 12.8 oz)  BMI 39.46 kg/m2 , BMI Body mass index is 39.46 kg/(m^2). Affect appropriate Chronically ill white female  HEENT: vocal cord instability  Neck supple with no adenopathy JVP normal no bruits no thyromegaly Lungs clear with no wheezing and good diaphragmatic motion Heart:  S1/S2 no murmur, no rub, gallop or click PMI normal Abdomen: benighn, BS positve, no tenderness, no AAA no bruit.  No HSM or HJR Distal pulses intact with no bruits No edema Neuro non-focal Skin warm and dry Scar LUE from melanoma surgery  No muscular weakness    EKG: 11/2014  ST rate 113 poor R wave progression  02/02/15  ST rate 103 otherwise normal  03/29/15 ST rate 117  Otherwise normal    Recent Labs: 05/31/2014: ALT 42* 09/28/2014: BUN <5*; Creatinine, Ser 0.67; Platelets 373; Potassium 3.7; Sodium 140 02/02/2015: Hemoglobin 14.3; TSH 2.16    Lipid Panel    Component Value Date/Time   CHOL * 02/26/2008 1826    442        ATP III CLASSIFICATION:  <200     mg/dL   Desirable  200-239  mg/dL   Borderline High  >=240    mg/dL   High   TRIG 1038 RESULTS CONFIRMED BY MANUAL DILUTION* 02/26/2008 1826   HDL 41 02/26/2008 1826   CHOLHDL 10.8 02/26/2008 1826   VLDL UNABLE TO CALCULATE IF TRIGLYCERIDE OVER 400 mg/dL 02/26/2008 1826   LDLCALC  02/26/2008 1826    UNABLE TO CALCULATE IF TRIGLYCERIDE OVER 400 mg/dL        Total Cholesterol/HDL:CHD Risk Coronary Heart Disease Risk Table                      Men   Women  1/2 Average Risk   3.4   3.3      Wt Readings from Last 3 Encounters:  03/29/15 97.886 kg (215 lb 12.8 oz)  02/02/15 98.793 kg (217 lb 12.8 oz)  12/04/14 106.142 kg (234 lb)      Other studies Reviewed: Additional studies/ records that were reviewed today include: Xrays, ECG and Epic notes.    ASSESSMENT AND PLAN:  1.  Chest Pain:  Atypical coronary calcium on CT not a lot.  Once issues with tachycardia resolved may benefit from [facility] but clinically this is non anginal pain 2. Tachycardia:  Normal echo and labs start low does bystolic and f/u  With primary  3. OSA:  CPAP on home oxygen f/u pulmonary  4. Chol:   Cholesterol is at goal.  Continue current dose of statin and diet Rx.  No myalgias or side effects.  F/U  LFT's in 6 months. Lab Results  Component Value Date   LDLCALC  02/26/2008    UNABLE TO CALCULATE IF TRIGLYCERIDE OVER 400 mg/dL        Total Cholesterol/HDL:CHD Risk Coronary Heart Disease Risk Table                     Men   Women  1/2 Average Risk   3.4   3.3  F/U labs with primary LDL goal less than 100 with positive coronary  calcium          HTN:  Add bystolic  F/u primary consider adding hyzaar if BP still high after HR better with  beta blocker    Current medicines are reviewed at length with the patient today.  The patient does not have concerns regarding medicines.  The following changes have been made:  no change  Labs/ tests ordered today include:  TSH/T4 Hct and ESR   Echo   Orders Placed This Encounter  Procedures  . EKG 12-Lead     Disposition:   FU with me PRN      Signed, Jenkins Rouge, MD  03/29/2015 10:13 AM    [facility] [address], [place], [place]  [address] Phone: [phone]; Fax: [phone]

## 2015-04-03 ENCOUNTER — Other Ambulatory Visit: Payer: Self-pay

## 2015-04-03 NOTE — Patient Outreach (Signed)
[facility]) Care Management  [place]  04/03/2015   Cheryl [name] 10-07-1965 [id number]   Telephone call to patient for monthly call. Patient reports she is here. She reports some good and bad days with her pain.  She reports that her pain is mainly in her hands. Patient reports pain today is 10/10 but reports she has not taken her pain medication today.  Advised patient on importance of taking medication routinely to keep pain well managed.  She verbalized understanding.    Diabetes: Patient reports that her blood sugars have been all over the place.  This morning her fasting sugar was 242. Patient states she has not had much of an appetite but states she may eat a bagel with cream cheese or an egg with some bacon in the morning.  Coached patient on importance of eating regularly to help regulate her sugars. She verbalized understanding.   Current Medications:  Current Outpatient Prescriptions  Medication Sig Dispense Refill  . ACCU-CHEK FASTCLIX LANCETS MISC     . ACCU-CHEK SMARTVIEW test strip     . albuterol (PROVENTIL HFA;VENTOLIN HFA) 108 (90 BASE) MCG/ACT inhaler Inhale 2 puffs into the lungs every 6 (six) hours as needed for wheezing or shortness of breath. 3 Inhaler 1  . atorvastatin (LIPITOR) 10 MG tablet Take 10 mg by mouth every evening.    . Azelastine-Fluticasone (DYMISTA) 137-50 MCG/ACT SUSP Place 2 sprays into the nose daily. 3 Bottle 1  . BD INSULIN SYRINGE ULTRAFINE 31G X 5/16" 0.3 ML MISC     . diazepam (VALIUM) 5 MG tablet Take 5 mg by mouth 2 (two) times daily as needed for anxiety.     . Diclofenac Sodium 3 % GEL Apply 1 application topically as needed. For pain    . diltiazem (CARDIZEM CD) 240 MG 24 hr capsule Take 240 mg by mouth daily.      . DULoxetine (CYMBALTA) 60 MG capsule Take 60 mg by mouth daily.    [name] gabapentin (NEURONTIN) 800 MG tablet Take 800 mg by mouth 2 (two) times daily.     [name] HYDROcodone-acetaminophen (NORCO) 7.5-325 MG  per tablet Take 2 tablets by mouth daily.    [name] ibuprofen (ADVIL,MOTRIN) 800 MG tablet Take 1 tablet (800 mg total) by mouth every 8 (eight) hours as needed for moderate pain. 30 tablet 0  . insulin regular human CONCENTRATED (HUMULIN R) 500 UNIT/ML SOLN injection Inject 50-100 Units into the skin See admin instructions. 500 Unit/ML-  125 units (25 unit markings on U-100 insulin syringe) 30 mins before breakfast,  90 units (18 unit marking on syringe) before lunch and 20 units (4 unit markings syringe) before evening meal [place] TID  (skip if missing a meal).    [name] ipratropium-albuterol (DUONEB) 0.5-2.5 (3) MG/3ML SOLN Take 3 mLs by nebulization every 2 (two) hours as needed (shortness of breath). 1080 mL 1  . ketoconazole (NIZORAL) 2 % cream Apply 1 application topically as needed. For rashes    . levothyroxine (SYNTHROID, LEVOTHROID) 100 MCG tablet Take 100 mcg by mouth daily before breakfast.    . mometasone-formoterol (DULERA) 100-5 MCG/ACT AERO Inhale 2 puffs into the lungs 2 (two) times daily. 3 Inhaler 3  . nebivolol (BYSTOLIC) 10 MG tablet Take 1 tablet (10 mg total) by mouth daily. 30 tablet 11  . omeprazole (PRILOSEC) 40 MG capsule Take 40 mg by mouth daily.     . promethazine (PHENERGAN) 25 MG tablet Take 25 mg by mouth  every 6 (six) hours as needed for nausea or vomiting.    [name] tiZANidine (ZANAFLEX) 4 MG capsule Take 4 mg by mouth at bedtime. For muscle spasms    . Vitamin D, Ergocalciferol, (DRISDOL) 50000 UNITS CAPS Take 50,000 Units by mouth every 7 (seven) days. Takes on Wed    . nebivolol (BYSTOLIC) 10 MG tablet Take 1 tablet (10 mg total) by mouth daily. 30 tablet 11   No current facility-administered medications for this visit.    Functional Status:  In your present state of health, do you have any difficulty performing the following activities: 01/09/2015 10/31/2014  Hearing? N N  Vision? - N  Difficulty concentrating or making decisions? N N  Walking or climbing stairs? Y Y   Dressing or bathing? N N  Doing errands, shopping? Y N  Preparing Food and eating ? N -  Using the Toilet? N -  In the past six months, have you accidently leaked urine? N -  Do you have problems with loss of bowel control? N -  Managing your Medications? Y -  Managing your Finances? N -  Housekeeping or managing your Housekeeping? Y -    Fall/Depression Screening: PHQ 2/9 Scores 04/03/2015 03/05/2015 02/06/2015 12/21/2014 10/31/2014  PHQ - 2 Score 2 2 2  0 2  PHQ- 9 Score 7 7 6  - 8   THN CM Care Plan Problem One        Most Recent Value   Care Plan Problem One  knowledge deficit related to diabetes type 2   Role Documenting the Problem One  [place] for Problem One  Active   THN Long Term Goal (31-90 days)  pt will maintain 7,14, 30 day average under 200 within 90 days.   THN Long Term Goal Start Date  03/05/15 [goal restarted]   Interventions for Problem One Long Term Goal  Discussed with patient the importance of eating meals on a regular basis to help regulate blood sugars.     THN CM Short Term Goal #1 (0-30 days)  pt will walk 2-3 times weekly within 30 days.   THN CM Short Term Goal #1 Start Date  04/03/15 [goal continued]   Interventions for Short Term Goal #1  Reviewed with patient about walking in house to increase activity.     THN CM Short Term Goal #2 (0-30 days)  pt will maintain 7, 14, 30 day averages below 200 within 30 days.   THN CM Short Term Goal #2 Start Date  10/31/14   [facility] CM Short Term Goal #2 Met Date  11/30/14   Interventions for Short Term Goal #2  RN CM reviewed importance of eating 3 meals per day and eating adequate protein, complex carbohydrates.    THN CM Care Plan Problem Two        Most Recent Value   Care Plan Problem Two  Alternation in activity related to back, left side pain [From [employer] system]   Role Documenting the Problem Two  [place] for Problem Two  Active   Interventions for Problem Two Long Term Goal    Reviewed with patient the importance of taking pain medication on a routine basis to keep pain controlled.     THN Long Term Goal (31-90) days  Pt will have adequately managed pain within 90 days   THN Long Term Goal Start Date  02/06/15 [goal restarted]   THN CM Short Term Goal #1 (0-30  days)  Pt will have effectively managed pain and increased mobility within 30 days   THN CM Short Term Goal #1 Start Date  01/09/15 [goal restarted]   [facility] CM Short Term Goal #1 Met Date   03/05/15   Interventions for Short Term Goal #2   Patient seeing cardiologists for chest pain. reviewed importance of taking pain medication as prescribed, having positive coping mechanisms to deal with stress, practice relaxation.    THN CM Care Plan Problem Three        Most Recent Value   Care Plan Problem Three  Pt having ongoing financial difficulties (refuses LCSW) [documentation from [employer] system]   Role Documenting the Problem Three  Care Management Coordinator   Care Plan for Problem Three  Not Active   THN Long Term Goal (31-90) days  Pt will verbalize financial situation improving and able to afford copays, pay all bills within 90 days.   THN Long Term Goal Start Date  10/03/14   THN Long Term Goal Met Date  11/30/14   THN CM Short Term Goal #1 (0-30 days)  Pt will go to local department of social services within 30 days and apply for medicaid and food stamps within 30 days   THN CM Short Term Goal #1 Start Date  10/03/14   [facility] CM Short Term Goal #1 Met Date  11/30/14 [pt no longer wants to work towards this goal.]   Interventions for Short Term Goal #1  talked with pt about going to social services to reapply for medicaid, food stamps, pt daughter now working and pt may not proceed forward with this.       Assessment:  Patient continues to benefit from health coach outreach for disease management.    Plan: RN Health Coach will contact patient within one month and patient agrees to next outreach.   [name], RN, MSN [place] [phone]

## 2015-04-20 DIAGNOSIS — D485 Neoplasm of uncertain behavior of skin: Secondary | ICD-10-CM | POA: Diagnosis not present

## 2015-04-20 DIAGNOSIS — Z85820 Personal history of malignant melanoma of skin: Secondary | ICD-10-CM | POA: Diagnosis not present

## 2015-04-20 DIAGNOSIS — D2362 Other benign neoplasm of skin of left upper limb, including shoulder: Secondary | ICD-10-CM | POA: Diagnosis not present

## 2015-04-20 DIAGNOSIS — L570 Actinic keratosis: Secondary | ICD-10-CM | POA: Diagnosis not present

## 2015-04-20 DIAGNOSIS — D2272 Melanocytic nevi of left lower limb, including hip: Secondary | ICD-10-CM | POA: Diagnosis not present

## 2015-04-20 DIAGNOSIS — D224 Melanocytic nevi of scalp and neck: Secondary | ICD-10-CM | POA: Diagnosis not present

## 2015-04-20 DIAGNOSIS — L720 Epidermal cyst: Secondary | ICD-10-CM | POA: Diagnosis not present

## 2015-04-20 DIAGNOSIS — L821 Other seborrheic keratosis: Secondary | ICD-10-CM | POA: Diagnosis not present

## 2015-04-20 DIAGNOSIS — L814 Other melanin hyperpigmentation: Secondary | ICD-10-CM | POA: Diagnosis not present

## 2015-04-21 DIAGNOSIS — J4540 Moderate persistent asthma, uncomplicated: Secondary | ICD-10-CM | POA: Diagnosis not present

## 2015-04-23 DIAGNOSIS — G894 Chronic pain syndrome: Secondary | ICD-10-CM | POA: Diagnosis not present

## 2015-04-23 DIAGNOSIS — J329 Chronic sinusitis, unspecified: Secondary | ICD-10-CM | POA: Diagnosis not present

## 2015-04-23 DIAGNOSIS — M797 Fibromyalgia: Secondary | ICD-10-CM | POA: Diagnosis not present

## 2015-04-23 DIAGNOSIS — G4761 Periodic limb movement disorder: Secondary | ICD-10-CM | POA: Diagnosis not present

## 2015-04-23 DIAGNOSIS — E559 Vitamin D deficiency, unspecified: Secondary | ICD-10-CM | POA: Diagnosis not present

## 2015-04-24 DIAGNOSIS — E1165 Type 2 diabetes mellitus with hyperglycemia: Secondary | ICD-10-CM | POA: Diagnosis not present

## 2015-04-24 DIAGNOSIS — Z794 Long term (current) use of insulin: Secondary | ICD-10-CM | POA: Diagnosis not present

## 2015-04-24 DIAGNOSIS — E1142 Type 2 diabetes mellitus with diabetic polyneuropathy: Secondary | ICD-10-CM | POA: Diagnosis not present

## 2015-04-24 DIAGNOSIS — E039 Hypothyroidism, unspecified: Secondary | ICD-10-CM | POA: Diagnosis not present

## 2015-04-24 DIAGNOSIS — E782 Mixed hyperlipidemia: Secondary | ICD-10-CM | POA: Diagnosis not present

## 2015-05-01 ENCOUNTER — Ambulatory Visit: Payer: Self-pay

## 2015-05-01 ENCOUNTER — Other Ambulatory Visit: Payer: Self-pay

## 2015-05-01 DIAGNOSIS — E1169 Type 2 diabetes mellitus with other specified complication: Secondary | ICD-10-CM

## 2015-05-01 NOTE — Addendum Note (Signed)
Addended by: [name] on: 05/01/2015 12:34 PM   Modules accepted: Orders

## 2015-05-01 NOTE — Patient Outreach (Signed)
[facility]) Care Management  [place]  05/01/2015   JAMIRIA LANGILL 10-16-1965 [id number]  Subjective: Monthly telephone call to patient for outreach. Patient reports she is having some problems with her finances and is going to apply for food stamps on Thursday.  Patient states she wants to apply for medicaid but not sure what to do.  Patient agreeable to social work outreach for assistance.    Diabetes: Patient reports some changes with her diabetic medications and her blood sugars are better.  She reports she is now on Jardiance and also Humulin R pen.  Today's blood sugar was 87.   Chronic Pain: Patient continues to deal with chronic pain mainly to joints.  Presently rates at 10/10 as she has just taken something for pain.  Discussed importance of taking pain medication regularly to keep pain under control.  She verbalized understanding.    Objective:   Current Medications:  Current Outpatient Prescriptions  Medication Sig Dispense Refill  . ACCU-CHEK FASTCLIX LANCETS MISC     . ACCU-CHEK SMARTVIEW test strip     . albuterol (PROVENTIL HFA;VENTOLIN HFA) 108 (90 BASE) MCG/ACT inhaler Inhale 2 puffs into the lungs every 6 (six) hours as needed for wheezing or shortness of breath. 3 Inhaler 1  . atorvastatin (LIPITOR) 10 MG tablet Take 10 mg by mouth every evening.    . Azelastine-Fluticasone (DYMISTA) 137-50 MCG/ACT SUSP Place 2 sprays into the nose daily. 3 Bottle 1  . BD INSULIN SYRINGE ULTRAFINE 31G X 5/16" 0.3 ML MISC     . cefdinir (OMNICEF) 300 MG capsule Take 300 mg by mouth every 12 (twelve) hours.    . diazepam (VALIUM) 5 MG tablet Take 5 mg by mouth 2 (two) times daily as needed for anxiety.     . Diclofenac Sodium 3 % GEL Apply 1 application topically as needed. For pain    . diltiazem (CARDIZEM CD) 240 MG 24 hr capsule Take 240 mg by mouth daily.      . DULoxetine (CYMBALTA) 60 MG capsule Take 60 mg by mouth daily.    . empagliflozin (JARDIANCE)  10 MG TABS tablet Take 10 mg by mouth daily.    [name] gabapentin (NEURONTIN) 800 MG tablet Take 800 mg by mouth 2 (two) times daily.     [name] HYDROcodone-acetaminophen (NORCO) 7.5-325 MG per tablet Take 2 tablets by mouth daily.    [name] ibuprofen (ADVIL,MOTRIN) 800 MG tablet Take 1 tablet (800 mg total) by mouth every 8 (eight) hours as needed for moderate pain. 30 tablet 0  . insulin regular human CONCENTRATED (HUMULIN R) 500 UNIT/ML SOLN injection Inject 50-100 Units into the skin See admin instructions. 500 Unit/ML-  125 units (25 unit markings on U-100 insulin syringe) 30 mins before breakfast,  90 units (18 unit marking on syringe) before lunch and 20 units (4 unit markings syringe) before evening meal [place] TID  (skip if missing a meal).    [name] ipratropium-albuterol (DUONEB) 0.5-2.5 (3) MG/3ML SOLN Take 3 mLs by nebulization every 2 (two) hours as needed (shortness of breath). 1080 mL 1  . ketoconazole (NIZORAL) 2 % cream Apply 1 application topically as needed. For rashes    . levothyroxine (SYNTHROID, LEVOTHROID) 100 MCG tablet Take 100 mcg by mouth daily before breakfast.    . mometasone-formoterol (DULERA) 100-5 MCG/ACT AERO Inhale 2 puffs into the lungs 2 (two) times daily. 3 Inhaler 3  . nebivolol (BYSTOLIC) 10 MG tablet Take 1 tablet (10 mg total)  by mouth daily. 30 tablet 11  . nebivolol (BYSTOLIC) 10 MG tablet Take 1 tablet (10 mg total) by mouth daily. 30 tablet 11  . omeprazole (PRILOSEC) 40 MG capsule Take 40 mg by mouth daily.     . promethazine (PHENERGAN) 25 MG tablet Take 25 mg by mouth every 6 (six) hours as needed for nausea or vomiting.    [name] tiZANidine (ZANAFLEX) 4 MG capsule Take 4 mg by mouth at bedtime. For muscle spasms    . Vitamin D, Ergocalciferol, (DRISDOL) 50000 UNITS CAPS Take 50,000 Units by mouth every 7 (seven) days. Takes on Wed     No current facility-administered medications for this visit.    Functional Status:  In your present state of health, do you have any  difficulty performing the following activities: 01/09/2015 10/31/2014  Hearing? N N  Vision? - N  Difficulty concentrating or making decisions? N N  Walking or climbing stairs? Y Y  Dressing or bathing? N N  Doing errands, shopping? Y N  Preparing Food and eating ? N -  Using the Toilet? N -  In the past six months, have you accidently leaked urine? N -  Do you have problems with loss of bowel control? N -  Managing your Medications? Y -  Managing your Finances? N -  Housekeeping or managing your Housekeeping? Y -    Fall/Depression Screening: PHQ 2/9 Scores 05/01/2015 04/03/2015 03/05/2015 02/06/2015 12/21/2014 10/31/2014  PHQ - 2 Score _0 0 2  PHQ- 9 Score _1 - 8    Assessment: Patient continues to benefit from health coach outreach for disease management.    Plan:   [facility] CM Care Plan Problem One        Most Recent Value   Care Plan Problem One  knowledge deficit related to diabetes type 2   Role Documenting the Problem One  [facility] [place] for Problem One  Active   THN Long Term Goal (31-90 days)  pt will maintain 7,14, 30 day average under 200 within 90 days.   THN Long Term Goal Start Date  03/05/15 [goal restarted]   Interventions for Problem One Long Term Goal  Reviewed importance of maintaining a diabetic diet and eating regular meals.     THN CM Short Term Goal #1 (0-30 days)  pt will walk 2-3 times weekly within 30 days.   THN CM Short Term Goal #1 Start Date  04/03/15 [goal continued]   Interventions for Short Term Goal #1  Reemphasized importance of exercise and maintaining blood sugars.     THN CM Short Term Goal #2 (0-30 days)  pt will maintain 7, 14, 30 day averages below 200 within 30 days.   THN CM Short Term Goal #2 Start Date  10/31/14   [facility] CM Short Term Goal #2 Met Date  11/30/14   Interventions for Short Term Goal #2  RN CM reviewed importance of eating 3 meals per day and eating adequate protein, complex carbohydrates.    THN CM Care Plan Problem  Two        Most Recent Value   Care Plan Problem Two  Alternation in activity related to back, left side pain [From [employer] system]   Role Documenting the Problem Two  [place] for Problem Two  Active   Interventions for Problem Two Long Term Goal   Discussed with patient pain regimen and importance of taking medication  regularly.     THN Long Term Goal (31-90) days  Pt will have adequately managed pain within 90 days   THN Long Term Goal Start Date  05/01/15 [goal continued]   THN CM Short Term Goal #1 (0-30 days)  Pt will have effectively managed pain and increased mobility within 30 days   THN CM Short Term Goal #1 Start Date  01/09/15 [goal restarted]   [facility] CM Short Term Goal #1 Met Date   03/05/15   Interventions for Short Term Goal #2   Patient seeing cardiologists for chest pain. reviewed importance of taking pain medication as prescribed, having positive coping mechanisms to deal with stress, practice relaxation.    THN CM Care Plan Problem Three        Most Recent Value   Care Plan Problem Three  Pt having ongoing financial difficulties (refuses LCSW) [documentation from [employer] system]   Role Documenting the Problem Three  Care Management Coordinator   Care Plan for Problem Three  Not Active   THN Long Term Goal (31-90) days  Pt will verbalize financial situation improving and able to afford copays, pay all bills within 90 days.   THN Long Term Goal Start Date  10/03/14   THN Long Term Goal Met Date  11/30/14   THN CM Short Term Goal #1 (0-30 days)  Pt will go to local department of social services within 30 days and apply for medicaid and food stamps within 30 days   THN CM Short Term Goal #1 Start Date  10/03/14   [facility] CM Short Term Goal #1 Met Date  11/30/14 [pt no longer wants to work towards this goal.]   Interventions for Short Term Goal #1  talked with pt about going to social services to reapply for medicaid, food stamps, pt daughter now working and pt may not  proceed forward with this.      RN Health Coach will refer to social work for help with applying for medicaid.   RN Health Coach will contact patient within one month and patient agrees to next outreach.    [name], RN, MSN [place] [phone]

## 2015-05-01 NOTE — Patient Outreach (Signed)
[facility]) Care Management  05/01/2015  Cheryl [name] August 17, 1965 [id number]   Request from Jon Billings, RN to assign SW, assigned [employer], LCSW.  Thanks, Ronnell Freshwater. [place], [place] Assistant Phone: [phone] Fax: [phone]

## 2015-05-02 ENCOUNTER — Other Ambulatory Visit: Payer: Self-pay | Admitting: Licensed Clinical Social Worker

## 2015-05-02 NOTE — Patient Outreach (Signed)
Assessment:  CSW received referral on Cheryl [name].  Client sees Dr. Briscoe Deutscher as primary care doctor for client. Cheryl [name], [place], sent referral to CSW due to fact that client needed information about applying for Medicaid. Client told Cheryl [name] that client is in process of applying for food stamps assistance at this time.  CSW completed chart review on client on 05/02/15.  CSW called client on 05/02/15.  CSW spoke via phone with client on 05/02/15. CSW verified identity of client. Client and CSW spoke about food stamps application process for client. CSW informed client about items to take for food stamps application. CSW informed client about items to take to Department of Social Services for a Medicaid application for client. Client said she has financial challenges. She said she has limited income each month through [place].  She said she lives in a mobile home near her parents and pays monthly rent to live in mobile home. CSW also reminded client that if client has unmet medical expenses for past two years, she can take copies of these bills due for unmet medical expenses for client for past two years to Department of Social Services for Cheryl [name] application process for client. CSW gave client the Cheryl [name] phone number [phone]).  CSW encouraged client to call CSW as needed to discuss social work needs of client.  CSW thanked client for phone conversation on 05/02/15.   Plan: Client to take medications as prescribed and attend scheduled medical appointments. Client to communicate with Cheryl [name], [place], as needed to monitor health needs of client (health care education of client). CSW to contact client in two weeks to assess needs of client at that time.  Cheryl [name].Cheryl [name] MSW, LCSW Licensed Clinical Social Worker [facility] Care Management [phone]

## 2015-05-03 ENCOUNTER — Encounter: Payer: Self-pay | Admitting: Licensed Clinical Social Worker

## 2015-05-18 ENCOUNTER — Encounter (HOSPITAL_COMMUNITY): Payer: Self-pay | Admitting: Emergency Medicine

## 2015-05-18 ENCOUNTER — Emergency Department (HOSPITAL_COMMUNITY)
Admission: EM | Admit: 2015-05-18 | Discharge: 2015-05-18 | Disposition: A | Discharge status: Home / Self Care | Payer: Commercial Managed Care - HMO | Attending: Emergency Medicine | Admitting: Emergency Medicine

## 2015-05-18 DIAGNOSIS — F329 Major depressive disorder, single episode, unspecified: Secondary | ICD-10-CM | POA: Insufficient documentation

## 2015-05-18 DIAGNOSIS — E785 Hyperlipidemia, unspecified: Secondary | ICD-10-CM | POA: Diagnosis not present

## 2015-05-18 DIAGNOSIS — G8929 Other chronic pain: Secondary | ICD-10-CM | POA: Diagnosis not present

## 2015-05-18 DIAGNOSIS — Z87891 Personal history of nicotine dependence: Secondary | ICD-10-CM | POA: Insufficient documentation

## 2015-05-18 DIAGNOSIS — M791 Myalgia, unspecified site: Secondary | ICD-10-CM

## 2015-05-18 DIAGNOSIS — F419 Anxiety disorder, unspecified: Secondary | ICD-10-CM | POA: Diagnosis not present

## 2015-05-18 DIAGNOSIS — R531 Weakness: Secondary | ICD-10-CM | POA: Insufficient documentation

## 2015-05-18 DIAGNOSIS — E039 Hypothyroidism, unspecified: Secondary | ICD-10-CM | POA: Insufficient documentation

## 2015-05-18 DIAGNOSIS — K219 Gastro-esophageal reflux disease without esophagitis: Secondary | ICD-10-CM | POA: Insufficient documentation

## 2015-05-18 DIAGNOSIS — Z880 Allergy status to penicillin: Secondary | ICD-10-CM | POA: Diagnosis not present

## 2015-05-18 DIAGNOSIS — E119 Type 2 diabetes mellitus without complications: Secondary | ICD-10-CM | POA: Diagnosis not present

## 2015-05-18 DIAGNOSIS — M797 Fibromyalgia: Secondary | ICD-10-CM | POA: Diagnosis not present

## 2015-05-18 DIAGNOSIS — I10 Essential (primary) hypertension: Secondary | ICD-10-CM | POA: Insufficient documentation

## 2015-05-18 DIAGNOSIS — J449 Chronic obstructive pulmonary disease, unspecified: Secondary | ICD-10-CM | POA: Diagnosis not present

## 2015-05-18 DIAGNOSIS — Z79899 Other long term (current) drug therapy: Secondary | ICD-10-CM | POA: Insufficient documentation

## 2015-05-18 DIAGNOSIS — Z794 Long term (current) use of insulin: Secondary | ICD-10-CM | POA: Insufficient documentation

## 2015-05-18 DIAGNOSIS — R52 Pain, unspecified: Secondary | ICD-10-CM | POA: Diagnosis present

## 2015-05-18 LAB — COMPREHENSIVE METABOLIC PANEL
ALT: 47 U/L (ref 14–54)
AST: 46 U/L — ABNORMAL HIGH (ref 15–41)
Albumin: 4.5 g/dL (ref 3.5–5.0)
Alkaline Phosphatase: 86 U/L (ref 38–126)
Anion gap: 10 (ref 5–15)
BUN: 12 mg/dL (ref 6–20)
CO2: 23 mmol/L (ref 22–32)
Calcium: 9.8 mg/dL (ref 8.9–10.3)
Chloride: 104 mmol/L (ref 101–111)
Creatinine, Ser: 0.54 mg/dL (ref 0.44–1.00)
GFR calc Af Amer: >60 mL/min (ref >60)
GFR calc non Af Amer: >60 mL/min (ref >60)
Glucose, Bld: 76 mg/dL (ref 65–99)
Potassium: 3.4 mmol/L — ABNORMAL LOW (ref 3.5–5.1)
Sodium: 137 mmol/L (ref 135–145)
Total Bilirubin: 0.6 mg/dL (ref 0.3–1.2)
Total Protein: 8.1 g/dL (ref 6.5–8.1)

## 2015-05-18 LAB — CBC WITH DIFFERENTIAL/PLATELET
Basophils Absolute: 0.1 10*3/uL (ref 0.0–0.1)
Basophils Relative: 0 %
Eosinophils Absolute: 0.4 10*3/uL (ref 0.0–0.7)
Eosinophils Relative: 3 %
HCT: 38.8 % (ref 36.0–46.0)
Hemoglobin: 13.4 g/dL (ref 12.0–15.0)
Lymphocytes Relative: 30 %
Lymphs Abs: 4.9 10*3/uL — ABNORMAL HIGH (ref 0.7–4.0)
MCH: 30.5 pg (ref 26.0–34.0)
MCHC: 34.5 g/dL (ref 30.0–36.0)
MCV: 88.2 fL (ref 78.0–100.0)
Monocytes Absolute: 1 10*3/uL (ref 0.1–1.0)
Monocytes Relative: 6 %
Neutro Abs: 10.1 10*3/uL — ABNORMAL HIGH (ref 1.7–7.7)
Neutrophils Relative %: 61 %
Platelets: 401 10*3/uL — ABNORMAL HIGH (ref 150–400)
RBC: 4.4 MIL/uL (ref 3.87–5.11)
RDW: 14 % (ref 11.5–15.5)
WBC: 16.5 10*3/uL — ABNORMAL HIGH (ref 4.0–10.5)

## 2015-05-18 LAB — URINE MICROSCOPIC-ADD ON

## 2015-05-18 LAB — URINALYSIS, ROUTINE W REFLEX MICROSCOPIC
Bilirubin Urine: NEGATIVE
Glucose, UA: 500 mg/dL — AB
Ketones, ur: NEGATIVE mg/dL
Leukocytes, UA: NEGATIVE
Nitrite: NEGATIVE
Protein, ur: NEGATIVE mg/dL
Specific Gravity, Urine: 1.03 (ref 1.005–1.030)
Urobilinogen, UA: 0.2 mg/dL (ref 0.0–1.0)
pH: 6 (ref 5.0–8.0)

## 2015-05-18 NOTE — ED Notes (Signed)
Pt. Reports generalized body aches , generalized weakness/fatigue with joints pain/stiffness for several weeks , denies injury , no fever or chills.

## 2015-05-18 NOTE — Discharge Instructions (Signed)

## 2015-05-18 NOTE — ED Provider Notes (Signed)
CSN: [id number]     Arrival date & time 05/18/15  1911 History   First MD Initiated Contact with Patient 05/18/15 2052     Chief Complaint  Patient presents with  . Generalized Body Aches  . Weakness     (Consider location/radiation/quality/duration/timing/severity/associated sxs/prior Treatment) HPI   Cheryl [name] is a 49 y.o. female who presents for evaluation of diffuse pain, and muscles and joints, for 1 month and worsening for 2 days. She saw her PCP today and he recommended that she come to the emergency department. She denies fever, nausea, vomiting, weakness or dizziness. She's taking her usual medications, without relief. There are no other known modifying factors.   Past Medical History  Diagnosis Date  . GERD (gastroesophageal reflux disease)   . Chronic airway obstruction, not elsewhere classified   . Asthma   . Hyperlipidemia   . Hypothyroidism   . Depression   . Gastroparesis     from DM and chronic narcotic use  . DDD (degenerative disc disease)     CERVIAL AND LUMBAR  . Edema   . Human parvovirus infection   . 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 Fatty liver disease, nonalcoholic   . Positive PPD   . Vitamin D deficiency   . Vocal cord dysfunction   . PONV (postoperative nausea and vomiting)   . Hypertension   . Shortness of breath     "at anytime; it's gotten worse recently" (02/09/2013)  . OSA (obstructive sleep apnea)     "have mask;; don't use it; no one came out to check it" (02/09/2013)  . Type II diabetes mellitus ([place])   . Fibromyalgia     "severe" (02/09/2013)  . Peripheral neuropathy ([place])     "severe" (02/09/2013)  . Migraine headache     "weekly; worse lately" (02/09/2013)  . Chronic pain     "q where; herniated disc in my tailbone" (02/09/2013)  . Anxiety   . Interstitial cystitis    Past Surgical History  Procedure Laterality Date  . Abdominal hysterectomy  1993  . Cholecystectomy   ?1990  . Nasal sinus surgery  1986; 1990's    "i've had 3" (02/09/2013)  . Knee arthroscopy Left 1990's    "fell; clot behind knee cap had to be removed" (02/09/2013)  . Hip surgery Right 2002    "did something to the tibula band" (02/09/2013)  . Shoulder arthroscopy w/ rotator cuff repair Right     "had to go deep in rotator cuff" (02/09/2013)  . Anterior cervical decomp/discectomy fusion  2004  . Carpal tunnel release Bilateral ?1980's   Family History  Problem Relation Age of Onset  . Coronary artery disease Father    Social History  Substance Use Topics  . Smoking status: Former Smoker -- 0.10 packs/day for 3 years    Types: Cigarettes    Quit date: 07/28/2005  . Smokeless tobacco: Never Used  . Alcohol Use: No   OB History    No data available     Review of Systems  All other systems reviewed and are negative.     Allergies  Influenza vaccines; Iohexol; Levofloxacin; Nucynta; Pregabalin; Sulfonamide derivatives; Vilazodone hcl; Amoxicillin; Biaxin; Carbamazepine; Ciprofloxacin; Dilaudid; and Percocet  Home Medications   Prior to Admission medications   Medication Sig Start Date End Date Taking? Authorizing Provider  albuterol (PROVENTIL HFA;VENTOLIN HFA) 108 (90 BASE) MCG/ACT inhaler Inhale 2 puffs into the lungs every  6 (six) hours as needed for wheezing or shortness of breath. 10/18/14  Yes Deneise Lever, MD  atorvastatin (LIPITOR) 40 MG tablet Take 40 mg by mouth daily.   Yes Historical Provider, MD  Azelastine-Fluticasone (DYMISTA) 137-50 MCG/ACT SUSP Place 2 sprays into the nose daily. 10/18/14  Yes Deneise Lever, MD  diazepam (VALIUM) 5 MG tablet Take 5 mg by mouth 2 (two) times daily as needed for anxiety.    Yes Historical Provider, MD  Diclofenac Sodium 3 % GEL Apply 1 application topically as needed. For pain 02/01/15  Yes Historical Provider, MD  diltiazem (CARDIZEM CD) 240 MG 24 hr capsule Take 240 mg by mouth daily.     Yes Historical Provider, MD   DULoxetine (CYMBALTA) 60 MG capsule Take 60 mg by mouth daily.   Yes Historical Provider, MD  empagliflozin (JARDIANCE) 10 MG TABS tablet Take 10 mg by mouth daily.   Yes Historical Provider, MD  HYDROcodone-acetaminophen (NORCO) 7.5-325 MG per tablet Take 2 tablets by mouth every 4 (four) hours as needed for moderate pain.  01/17/15  Yes Historical Provider, MD  ibuprofen (ADVIL,MOTRIN) 800 MG tablet Take 1 tablet (800 mg total) by mouth every 8 (eight) hours as needed for moderate pain. 09/28/14  Yes Duffy Bruce, MD  insulin regular human CONCENTRATED (HUMULIN R) 500 UNIT/ML SOLN injection Inject 50-100 Units into the skin See admin instructions. 500 Unit/ML-  125 units (25 unit markings on U-100 insulin syringe) 30 mins before breakfast,  90 units (18 unit marking on syringe) before lunch and 20 units (4 unit markings syringe) before evening meal [place] TID  (skip if missing a meal).   Yes Historical Provider, MD  ipratropium-albuterol (DUONEB) 0.5-2.5 (3) MG/3ML SOLN Take 3 mLs by nebulization every 2 (two) hours as needed (shortness of breath). 10/18/14  Yes Deneise Lever, MD  ketoconazole (NIZORAL) 2 % cream Apply 1 application topically as needed. For rashes 12/20/14  Yes Historical Provider, MD  levothyroxine (SYNTHROID, LEVOTHROID) 100 MCG tablet Take 100 mcg by mouth daily before breakfast.   Yes Historical Provider, MD  mometasone-formoterol (DULERA) 100-5 MCG/ACT AERO Inhale 2 puffs into the lungs 2 (two) times daily. 10/19/14  Yes Deneise Lever, MD  nebivolol (BYSTOLIC) 10 MG tablet Take 1 tablet (10 mg total) by mouth daily. 03/29/15  Yes Josue Hector, MD  omeprazole (PRILOSEC) 40 MG capsule Take 40 mg by mouth daily.    Yes Historical Provider, MD  promethazine (PHENERGAN) 25 MG tablet Take 25 mg by mouth every 6 (six) hours as needed for nausea or vomiting.   Yes Historical Provider, MD  tiZANidine (ZANAFLEX) 4 MG capsule Take 4 mg by mouth at bedtime. For muscle spasms   Yes Historical  Provider, MD  Vitamin D, Ergocalciferol, (DRISDOL) 50000 UNITS CAPS Take 50,000 Units by mouth every 7 (seven) days. Takes on Wed   Yes Historical Provider, MD  ACCU-CHEK FASTCLIX LANCETS [place]  12/25/14   Historical Provider, MD  ACCU-CHEK SMARTVIEW test strip  12/25/14   Historical Provider, MD  BD INSULIN SYRINGE ULTRAFINE 31G X 5/16" 0.3 ML MISC  01/08/15   Historical Provider, MD  nebivolol (BYSTOLIC) 10 MG tablet Take 1 tablet (10 mg total) by mouth daily. Patient not taking: Reported on 05/18/2015 03/29/15   Josue Hector, MD   BP 144/75 mmHg  Pulse 86  Temp(Src) 98.2 F (36.8 C) (Oral)  Resp 18  Ht 5' 2"  (1.575 m)  Wt 215 lb (97.523 kg)  BMI 39.31 kg/m2  SpO2 96% Physical Exam  Constitutional: She is oriented to person, place, and time. She appears well-developed and well-nourished.  HENT:  Head: Normocephalic and atraumatic.  Right Ear: External ear normal.  Left Ear: External ear normal.  Eyes: Conjunctivae and EOM are normal. Pupils are equal, round, and reactive to light.  Neck: Normal range of motion and phonation normal. Neck supple.  Cardiovascular: Normal rate, regular rhythm and normal heart sounds.   Pulmonary/Chest: Effort normal and breath sounds normal. She exhibits no bony tenderness.  Abdominal: Soft. There is no tenderness.  Musculoskeletal: Normal range of motion.  Tender large joints and musculature of the arms and legs, bilaterally, without visible swelling or deformity.  Neurological: She is alert and oriented to person, place, and time. No cranial nerve deficit or sensory deficit. She exhibits normal muscle tone. Coordination normal.  Skin: Skin is warm, dry and intact. No rash noted.  No visible rash or skin abnormality  Psychiatric: She has a normal mood and affect. Her behavior is normal. Judgment and thought content normal.  Nursing note and vitals reviewed.   ED Course  Procedures (including critical care time)  Medications - No data to  display  Patient Vitals for the past 24 hrs:  BP Temp Temp src Pulse Resp SpO2 Height Weight  05/18/15 2229 144/75 mmHg - - 78 18 97 % - -  05/18/15 2215 144/75 mmHg - - 86 - 96 % - -  05/18/15 2200 151/82 mmHg - - 90 - 96 % - -  05/18/15 2145 141/75 mmHg - - 90 - 95 % - -  05/18/15 2130 161/75 mmHg - - 90 - 96 % - -  05/18/15 2115 148/64 mmHg - - 94 - 94 % - -  05/18/15 2100 158/82 mmHg - - 98 - 96 % - -  05/18/15 1944 166/100 mmHg 98.2 F (36.8 C) Oral 104 18 96 % 5' 2"  (1.575 m) 215 lb (97.523 kg)    10:30 PM Reevaluation with update and discussion. After initial assessment and treatment, an updated evaluation reveals no additional complaints, findings discussed with patient and family members, all questions were answered. Cross Review Labs Reviewed  CBC WITH DIFFERENTIAL/PLATELET - Abnormal; Notable for the following:    WBC 16.5 (*)    Platelets 401 (*)    Neutro Abs 10.1 (*)    Lymphs Abs 4.9 (*)    All other components within normal limits  COMPREHENSIVE METABOLIC PANEL - Abnormal; Notable for the following:    Potassium 3.4 (*)    AST 46 (*)    All other components within normal limits  URINALYSIS, ROUTINE W REFLEX MICROSCOPIC (NOT AT [facility]) - Abnormal; Notable for the following:    APPearance CLOUDY (*)    Glucose, UA 500 (*)    Hgb urine dipstick MODERATE (*)    All other components within normal limits  URINE MICROSCOPIC-ADD ON - Abnormal; Notable for the following:    Squamous Epithelial / LPF MANY (*)    Bacteria, UA FEW (*)    Casts HYALINE CASTS (*)    All other components within normal limits    Imaging Review No results found. I have personally reviewed and evaluated these images and lab results as part of my medical decision-making.   EKG Interpretation None      MDM   Final diagnoses:  Myalgia    General his myalgia, without signs of acute septic arthritis, metabolic instability,  serious bacterial infection.   Nursing  Notes Reviewed/ Care Coordinated Applicable Imaging Reviewed Interpretation of Laboratory Data incorporated into ED treatment  The patient appears reasonably screened and/or stabilized for discharge and I doubt any other medical condition or other [facility] requiring further screening, evaluation, or treatment in the ED at this time prior to discharge.  Plan: Home Medications- usual; Home Treatments- rest; return here if the recommended treatment, does not improve the symptoms; Recommended follow up- PCP prn     Daleen Bo, MD 05/18/15 2232

## 2015-05-18 NOTE — ED Notes (Signed)
Dr. Wentz at the bedside.  

## 2015-05-18 NOTE — ED Notes (Signed)
Pt states she went to her PCP today but saw a different MD that is unfamiliar with her case and they gave her oxycodone. She has taken 1 tablet but it hasn't helped do anything.

## 2015-05-18 NOTE — ED Notes (Signed)
Dr. Eulis Foster back at the bedside.

## 2015-05-21 DIAGNOSIS — J4540 Moderate persistent asthma, uncomplicated: Secondary | ICD-10-CM | POA: Diagnosis not present

## 2015-05-24 ENCOUNTER — Other Ambulatory Visit: Payer: Self-pay | Admitting: Internal Medicine

## 2015-05-29 ENCOUNTER — Other Ambulatory Visit: Payer: Self-pay

## 2015-05-29 NOTE — Patient Outreach (Signed)
[facility]) Care Management  [place]  05/29/2015   Cheryl [name] 05/08/1966 [id number]  Subjective: Telephone call to patient for monthly outreach. Patient states she is doing ok.  Patient had ER visit about two weeks ago for pain but reports that no changes where made with her medications.  Discussed with patient her pain medication regimen and advised on taking medication before pain gets to bad for her.  She verbalized understanding.  Patient reports that she will be seeing Dr. Elenora Gamma once Dr. Barnetta Chapel.  She reports her last appointment with him will be 06-25-15.  Patient still has not gone to apply for food stamps but states she going to try to go this week.   Diabetes: Patient reports that her blood sugars are better but still some 300's.  Blood sugar this morning was 167.  Discussed with patient importance of low carb diet and trying to engage in more activity.  Possibly more walks around the house.  She verbalized understanding.   Objective:   Current Medications:  Current Outpatient Prescriptions  Medication Sig Dispense Refill  . ACCU-CHEK FASTCLIX LANCETS MISC     . ACCU-CHEK SMARTVIEW test strip     . albuterol (PROVENTIL HFA;VENTOLIN HFA) 108 (90 BASE) MCG/ACT inhaler Inhale 2 puffs into the lungs every 6 (six) hours as needed for wheezing or shortness of breath. 3 Inhaler 1  . atorvastatin (LIPITOR) 40 MG tablet Take 40 mg by mouth daily.    . Azelastine-Fluticasone (DYMISTA) 137-50 MCG/ACT SUSP Place 2 sprays into the nose daily. 3 Bottle 1  . BD INSULIN SYRINGE ULTRAFINE 31G X 5/16" 0.3 ML MISC     . diazepam (VALIUM) 5 MG tablet Take 5 mg by mouth 2 (two) times daily as needed for anxiety.     . Diclofenac Sodium 3 % GEL Apply 1 application topically as needed. For pain    . diltiazem (CARDIZEM CD) 240 MG 24 hr capsule Take 240 mg by mouth daily.      . DULERA 100-5 MCG/ACT AERO INHALE 2 PUFFS INTO THE LUNGS TWICE DAILY 13 g 5  .  DULoxetine (CYMBALTA) 60 MG capsule Take 60 mg by mouth daily.    . empagliflozin (JARDIANCE) 10 MG TABS tablet Take 10 mg by mouth daily.    [name] HYDROcodone-acetaminophen (NORCO) 7.5-325 MG per tablet Take 2 tablets by mouth every 4 (four) hours as needed for moderate pain.     [name] ibuprofen (ADVIL,MOTRIN) 800 MG tablet Take 1 tablet (800 mg total) by mouth every 8 (eight) hours as needed for moderate pain. 30 tablet 0  . insulin regular human CONCENTRATED (HUMULIN R) 500 UNIT/ML SOLN injection Inject 50-100 Units into the skin See admin instructions. 500 Unit/ML-  125 units (25 unit markings on U-100 insulin syringe) 30 mins before breakfast,  90 units (18 unit marking on syringe) before lunch and 20 units (4 unit markings syringe) before evening meal [place] TID  (skip if missing a meal).    [name] ipratropium-albuterol (DUONEB) 0.5-2.5 (3) MG/3ML SOLN Take 3 mLs by nebulization every 2 (two) hours as needed (shortness of breath). 1080 mL 1  . ketoconazole (NIZORAL) 2 % cream Apply 1 application topically as needed. For rashes    . levothyroxine (SYNTHROID, LEVOTHROID) 100 MCG tablet Take 100 mcg by mouth daily before breakfast.    . nebivolol (BYSTOLIC) 10 MG tablet Take 1 tablet (10 mg total) by mouth daily. 30 tablet 11  . omeprazole (PRILOSEC) 40  MG capsule Take 40 mg by mouth daily.     . promethazine (PHENERGAN) 25 MG tablet Take 25 mg by mouth every 6 (six) hours as needed for nausea or vomiting.    [name] tiZANidine (ZANAFLEX) 4 MG capsule Take 4 mg by mouth at bedtime. For muscle spasms    . Vitamin D, Ergocalciferol, (DRISDOL) 50000 UNITS CAPS Take 50,000 Units by mouth every 7 (seven) days. Takes on Wed    . nebivolol (BYSTOLIC) 10 MG tablet Take 1 tablet (10 mg total) by mouth daily. (Patient not taking: Reported on 05/18/2015) 30 tablet 11   No current facility-administered medications for this visit.    Functional Status:  In your present state of health, do you have any difficulty performing the  following activities: 01/09/2015 10/31/2014  Hearing? N N  Vision? - N  Difficulty concentrating or making decisions? N N  Walking or climbing stairs? Y Y  Dressing or bathing? N N  Doing errands, shopping? Y N  Preparing Food and eating ? N -  Using the Toilet? N -  In the past six months, have you accidently leaked urine? N -  Do you have problems with loss of bowel control? N -  Managing your Medications? Y -  Managing your Finances? N -  Housekeeping or managing your Housekeeping? Y -    Fall/Depression Screening: PHQ 2/9 Scores 05/29/2015 05/01/2015 04/03/2015 03/05/2015 02/06/2015 12/21/2014 10/31/2014  PHQ - 2 Score 2 2 2 2 2  0 2  PHQ- 9 Score 7 7 7 7 6  - 8    Assessment: Patient continues to benefit from health coach outreach for disease management.    Plan:  [facility] CM Care Plan Problem One        Most Recent Value   Care Plan Problem One  knowledge deficit related to diabetes type 2   Role Documenting the Problem One  Health [place] for Problem One  Active   THN Long Term Goal (31-90 days)  pt will maintain 7,14, 30 day average under 200 within 90 days.   THN Long Term Goal Start Date  05/29/15 [goal continued]   Interventions for Problem One Long Term Goal  Discussed with patient maintaining low carbohydrate diet and watching sweets.     THN CM Short Term Goal #1 (0-30 days)  pt will walk 2-3 times weekly within 30 days.   THN CM Short Term Goal #1 Start Date  05/29/15 [goal continued]   Interventions for Short Term Goal #1  Discussed with patient in taking extra trips around in the house to increase activity.     THN CM Short Term Goal #2 (0-30 days)  pt will maintain 7, 14, 30 day averages below 200 within 30 days.   THN CM Short Term Goal #2 Start Date  10/31/14   [facility] CM Short Term Goal #2 Met Date  11/30/14   Interventions for Short Term Goal #2  RN CM reviewed importance of eating 3 meals per day and eating adequate protein, complex carbohydrates.    THN CM Care Plan  Problem Two        Most Recent Value   Care Plan Problem Two  Alternation in activity related to back, left side pain [From [employer] system]   Role Documenting the Problem Two  [place] for Problem Two  Active   Interventions for Problem Two Long Term Goal   Reviewed with patient pain regimen and stressed importance  of taking medication prior to pain getting unbearable.     THN Long Term Goal (31-90) days  Pt will have adequately managed pain within 90 days   THN Long Term Goal Start Date  05/29/15 [goal continued]   THN CM Short Term Goal #1 (0-30 days)  Pt will have effectively managed pain and increased mobility within 30 days   THN CM Short Term Goal #1 Start Date  01/09/15 [goal restarted]   [facility] CM Short Term Goal #1 Met Date   03/05/15   Interventions for Short Term Goal #2   Patient seeing cardiologists for chest pain. reviewed importance of taking pain medication as prescribed, having positive coping mechanisms to deal with stress, practice relaxation.    THN CM Care Plan Problem Three        Most Recent Value   Care Plan Problem Three  Pt having ongoing financial difficulties (refuses LCSW) [documentation from [employer] system]   Role Documenting the Problem Three  Care Management Coordinator   Care Plan for Problem Three  Not Active   THN Long Term Goal (31-90) days  Pt will verbalize financial situation improving and able to afford copays, pay all bills within 90 days.   THN Long Term Goal Start Date  10/03/14   THN Long Term Goal Met Date  11/30/14   THN CM Short Term Goal #1 (0-30 days)  Pt will go to local department of social services within 30 days and apply for medicaid and food stamps within 30 days   THN CM Short Term Goal #1 Start Date  10/03/14   [facility] CM Short Term Goal #1 Met Date  11/30/14 [pt no longer wants to work towards this goal.]   Interventions for Short Term Goal #1  talked with pt about going to social services to reapply for medicaid, food stamps,  pt daughter now working and pt may not proceed forward with this.     RN Health Coach will contact patient within one month and patient agrees to next outreach.    [name], RN, MSN [place] [phone]

## 2015-06-01 ENCOUNTER — Other Ambulatory Visit: Payer: Self-pay

## 2015-06-01 DIAGNOSIS — Z1231 Encounter for screening mammogram for malignant neoplasm of breast: Secondary | ICD-10-CM

## 2015-06-06 ENCOUNTER — Encounter: Payer: Self-pay | Admitting: Internal Medicine

## 2015-06-06 ENCOUNTER — Ambulatory Visit (INDEPENDENT_AMBULATORY_CARE_PROVIDER_SITE_OTHER): Payer: Commercial Managed Care - HMO | Admitting: Internal Medicine

## 2015-06-06 VITALS — BP 122/66 | HR 81 | Ht 62.0 in | Wt 223.2 lb

## 2015-06-06 DIAGNOSIS — Z23 Encounter for immunization: Secondary | ICD-10-CM

## 2015-06-06 DIAGNOSIS — I700 Atherosclerosis of aorta: Secondary | ICD-10-CM | POA: Diagnosis not present

## 2015-06-06 DIAGNOSIS — J4530 Mild persistent asthma, uncomplicated: Secondary | ICD-10-CM

## 2015-06-06 NOTE — Assessment & Plan Note (Signed)
Demonstrated on CT chest 01/04/2015 along with coronary artery calcium deposits. Represents complications of diabetic was former smoker.

## 2015-06-06 NOTE — Progress Notes (Signed)
Subjective:    Patient ID: Cheryl [name], female    DOB: January 29, 1966, 49 y.o.   MRN: [id number]  Asthma Her past medical history is significant for asthma.   June 24, 2010- Allergic rhinitis, OSA, DM, GERD..........................[name] here  Nurse-CC: 4 month follow up visit-wheezing, few asthma attacks, sneezing(using nasal sprays). Sleep- waking up "alot" and other days sleeping all day and not able to wake up well enough.  Now on cefdinir for sinusitis. Sneezing all Fall. Had CT sinus at [facility]- "chronic sinusitis".  Mother reports fall 2 weeks ago- fell onto knees and hands when knees buckled. [name] now when she stands. Evaluated at [facility]. Anticipate referral to [facility] neurology. Had flu vax. Still lives same house.  Continues CPAP. When too tight she got pressure marks- discussed. Not smoking. Getting divorced- looking at options.   August 28, 2010-Allergic rhinitis, Asthma, OSA, DM, GERD.............[name] here  Nurse-CC: Follow up per [name], FNP. Pt c/o chest pressure and tightness, pains in chest when breathing, cough with very little yellow mucus, increased SOB at rest and with activity x 1 wk.  CPAP 10- Usually using it all night every night. Has had to skip this week while ill.  Says she has been on abx since October for sinusitis- did CT sinus. Gets better then worse. In last 1-2 weeks, increased cough and wheeze , tussive soreness mid anterior and left lateral chest. CBC and CXR at [name]. Says 2 days ago WBC 14000, given nebs. Now on doxy 280m daily.  [facility].   11/11/10- Acute OV-Walk in  Complains of increased SOB, wheezing, dry cough, chest congestion, low grade temp x2days. Has a lot of sinus drainage, cough, post nasal drip and wheezing. Wears CPAP at night, no interference. Teeth hurt at  at times. Has fatigue and no energy.  Has been taking zyrtec without much help. Very dry in sinuses. Frontal headache.    12/27/10- Allergic rhinitis, Asthma, OSA, complicated by DM, GERD  Mother here After seeing TP in April got welll after 2 rounds of omnicef. Tongue feels funny. Diabetic control has not been good.  Breathing has been good.   07/08/11- 89YOF former smoker, followed for asthma, allergic rhinitis, OSA/ failed CPAP, complicated by DM, GERD.   Mother here and offers her observations.  Says she felt okay until yesterday. She couldn't walk well, was falling and had gone back to using her walker. They called 911 and they told her she was okay except glucose was high. Mother wonders about "inner ear"/vertigo. Treated with eardrops in October for otitis left ear. Has Bell's palsy off and on causing some weakness in the left lower face which other associates with episodes of viral illness such as bronchitis, or allergy. [name], [name], with a total of 3 rounds of antibiotics this fall.  She now says that cough began about the time she began ramipril from Dr KDwyane Dee    04/29/12- 89YOF former smoker, followed for asthma, allergic rhinitis, OSA/ failed CPAP, complicated by DM, GERD.  Daughter here.  Has had flu shot. Cough, wheezing x 4 days; states friend of hers passed last Friday with MRSA.  Recent cold with increased wheeze, cough, sweating, hoarseness. Reports temperature 101. Scant sputum was green and nasal discharge green  but no headache. 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  09/06/12- 6 YOF former smoker, followed for asthma, allergic rhinitis, OSA/ failed CPAP, complicated by DM, GERD.  Mother here.  Has had flu shot. FOLLOWS FYB:OFBPZW deep cough and wheezing; also states that [facility] came picked up CPAP machine due to Medicare and  stating patient not using it for 90 days. Daily wheeze using rescue inhaler or nebulizer to control.no major distress. We discussed ways to provide more stability. Diabetes and persistent thrush interfere with steroid choices. CXR 05/06/12 IMPRESSION:  No evidence of acute cardiopulmonary disease.  Original Report Authenticated By: [name], M.D.  12/01/2012 Acute OV  Complains of wheezing, chest tightness, dry cough, increased SOB - reports no improvement since last ov, worse this AM for 2 weeks .  Has been doing nebs 4x daily. No hemoptysis, n/v, orthopnea, edema, or chest pain.  No otc used. Used Tussionex with some help.  05/04/13  ER follow up  ER follow up , cough, wheezing for weeks with right sided pleuritic pain.  Patient went to the emergency room on October 7 for shortness, of breath and right-sided chest, and abdominal pain. Workup was unrevealing, with a negative. Chest x-ray, abdominal ultrasound and lab work Showed only minimally, elevated LFTs. Patient reports that she continues to have some pain. On her right rib area, especially, if she coughs. Patient describes a dry cough, and intermittent wheezing. She denies any discolored mucus or fever. Orthopnea, PND, leg swelling, or calf pain. Patient says she is currently being evaluated by neurology for her weakness, and muscle spasms.   Says cough is keeping her up at night .   05/31/13- 41 yoF46 YOF former smoker, followed for asthma, allergic rhinitis, OSA/ failed CPAP, complicated by DM, GERD.  Female friend here.   FOLLOWS FOR: has spasms in throat-getting choked up when eating or taking medications. Would like to try to get CPAP back-told that she is stopping breathing again. Had CPAP 11/ [facility]- taken away because of noncompliance Complains of "muscle spasms" in throat-chokes easily. This is episodic. She is seeing a neurologist at [facility] and is on Zanaflex. We discussed reflux precautions. Friend says she snores and stops  breathing. Sleeping in recliner because she says "hurt all over".  08/01/12- 75 yoF46 YOF former smoker, followed for asthma, allergic rhinitis, OSA/ failed CPAP, complicated by DM, GERD.  Mother here FOLLOWS FOR: had to repeat Sleep Study in December- She sleeps in a recliner to avoid back and leg pains. Muscle jerks bother her while sleeping. NPSG 07/06/13 WNL- AHI 4/ hr; PLMAs 2.6/ hr.  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  Mother here FOLLOWS FOR:can not tell a difference with just clonazepam; has had to use Zanaflex with clonazepam. Pt states she is waking up at least 2 times during the night having to use her rescue inhaler and neb tx. Also continues to use Dulera. Green sinus drainage with limited headache. Sleeps in a recliner. Brings DMV form.  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  Mother here FOLLOWS CHE:NIDPOEUMP doing okay overall; continues to use inhalers often. Will need to get neb meds through [facility] (put order in to set up with them) "allergic to flu shot" Had upper respiratory infection with acute bronchitis  early November now resolved Daughter telling her again that she snores loudly. One sleep study was positive then the next one was negative. CPAP was taken away by DME  CT chest 05/31/14 IMPRESSION: No acute findings in the chest, abdomen, or pelvis. Specifically, no findings to explain the patient's history of right-sided body pain. Electronically Signed  By: Misty Stanley M.D.  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  Mother here FOLLOWS FOR: pt states she's had pleurisy since Feb 2016 with no improvement.  c/o sob, L sided  chest/abdominal discomfort, nonprod cough.  Had neg VQ and CXR, some disk disease on T-spine xray Chest pains described as occasionally mid sternum but usually along the left lower costal margin, increased by deep breath, twisting or walking. Relieved by ibuprofen. Not affected by meals. Chokes frequently. Some dry cough and wheeze. Sleep pattern is irregular. She tends to sleep. 20 minute intervals and snores. No longer using CPAP or oxygen. CXR 09/28/14 IMPRESSION: No active cardiopulmonary disease. Electronically Signed  By: Lahoma Crocker M.D.  On: 09/28/2014 16:35  06/06/15- 49 year old female former smoker, followed for asthma, allergic rhinitis, periodic limb movement in sleep,, located by DM, GERD, Fibromyalgia, aortic and coronary atherosclerosis., HBP Mother here O2 2 L sleep/[employer] FOLLOWS FOR: Pt states she uses O2 nightly but continues to have trouble breathing through the night and daytime now; pt wakes up during the night and hard to get deep breath. "Allergic to flu shot" Always feels a little shortness of breath, not characterized as to rest or exertion. No routine wheeze or cough, chest pain or palpitation. No acute event. Using rescue inhaler once or twice daily, Dulera daily, occasional nebulizer and occasional Dymista nasal spray Saw cardiology for known ASCVD and HBP CT chest 01/04/15 IMPRESSION: 1. No acute findings in the thorax to account for the patient's symptoms. 2. Atherosclerosis, including left main and left anterior descending coronary artery disease. Please note that although the presence of coronary artery calcium documents the presence of coronary artery disease, the severity of this disease and any potential stenosis cannot be assessed on this non-gated CT examination. Assessment for potential risk factor modification, dietary therapy or pharmacologic therapy may be warranted, if clinically indicated. Electronically Signed  By: Vinnie Langton M.D.  On:  01/04/2015 09:15  Review of Systems- see HPI Constitutional:   No-   weight loss, night sweats, fevers, chills,+irregular sleep schedule, +fatigue, lassitude. HEENT:   No-headaches, difficulty swallowing, tooth/dental problems, +sore throat,       No-  sneezing, itching, +ear ache, nasal congestion, post nasal drip,  CV:  +chest pain, orthopnea, PND, swelling in lower extremities, anasarca,  dizziness, palpitations Resp: No- acute  shortness of breath with exertion or at rest.              No- productive cough,  + non-productive cough,  No- coughing up of blood.              No- change in color of mucus.   Skin: No-   rash or lesions. GI:  No-   heartburn, indigestion, abdominal pain, nausea, vomiting,  GU:  MS:  No-   joint pain or swelling.   Neuro-    +tremor Psych:  No- change in mood or affect. No depression or anxiety.  No memory loss.  Objective:   Physical Exam      OBJ- Physical Exam General- Alert,  Oriented, Affect-appropriate, Distress- none acute. + overweight Skin- rash-none, lesions- none, excoriation- none Lymphadenopathy- none Head- atraumatic            Eyes- Gross vision intact, PERRLA, conjunctivae and secretions clear            Ears- Hearing, canals-normal            Nose-  turbinate edema-none, no-Septal dev, mucus, polyps, erosion, perforation             Throat- Mallampati II , mucosa clear , drainage- none, tonsils- atrophic, voice quality is hesitant/hoarse+ Neck- flexible , trachea midline, no stridor , thyroid nl, carotid no bruit Chest - symmetrical excursion , unlabored           Heart/CV- RRR , no murmur , no gallop  , no rub, nl s1 s2                           - JVD- none , edema- none, stasis changes- none, varices- none           Lung- clear to P&A, wheeze- none, cough+light , dullness-none, rub- none           Chest wall-  Abd-  Br/ Gen/ Rectal- Not done, not indicated Extrem- cyanosis- none, clubbing, none, atrophy- none, strength- nl Neuro-  +hesitant speech, +intention tremor

## 2015-06-06 NOTE — Patient Instructions (Addendum)
  Order-schedule PFT       DX Asthma mild persistent   Pneumovax-23

## 2015-06-10 NOTE — Assessment & Plan Note (Signed)
Has seen cardiology

## 2015-06-10 NOTE — Assessment & Plan Note (Signed)
She uses bronchodilator medications but I'm not convinced her shortness of breath reflex abnormal airflow. Plan-schedule PFT

## 2015-06-15 ENCOUNTER — Ambulatory Visit (INDEPENDENT_AMBULATORY_CARE_PROVIDER_SITE_OTHER): Payer: Self-pay | Admitting: Ophthalmology

## 2015-06-21 DIAGNOSIS — J4540 Moderate persistent asthma, uncomplicated: Secondary | ICD-10-CM | POA: Diagnosis not present

## 2015-06-26 ENCOUNTER — Ambulatory Visit: Payer: Commercial Managed Care - HMO

## 2015-06-26 ENCOUNTER — Other Ambulatory Visit: Payer: Self-pay

## 2015-06-26 DIAGNOSIS — R0789 Other chest pain: Secondary | ICD-10-CM | POA: Diagnosis not present

## 2015-06-26 DIAGNOSIS — G894 Chronic pain syndrome: Secondary | ICD-10-CM | POA: Diagnosis not present

## 2015-06-26 DIAGNOSIS — M797 Fibromyalgia: Secondary | ICD-10-CM | POA: Diagnosis not present

## 2015-06-26 NOTE — Patient Outreach (Signed)
[facility]) Care Management  06/26/2015  Cheryl [name] 09-14-1965 [id number]   Telephone call attempt to patient for monthly outreach. No answer.  HIPAA compliant voice message left.    Plan: RN Health Coach will contact patient within 1-2 weeks.  [name], RN, MSN [place] [phone]

## 2015-06-29 ENCOUNTER — Ambulatory Visit: Payer: Commercial Managed Care - HMO

## 2015-07-02 ENCOUNTER — Ambulatory Visit: Payer: Commercial Managed Care - HMO

## 2015-07-02 ENCOUNTER — Other Ambulatory Visit: Payer: Self-pay

## 2015-07-02 NOTE — Patient Outreach (Signed)
[facility]) Care Management  07/02/2015  Cheryl [name] 1966-05-30 [id number]   Telephone call to patient for disease management follow up.  Unable to reach.  HIPPA compliant voice message left.  Plan: RN Health Coach will attempt telephone outreach within 1-2 weeks.   [name], RN, MSN [place] [phone]

## 2015-07-05 ENCOUNTER — Other Ambulatory Visit: Payer: Self-pay

## 2015-07-05 NOTE — Patient Outreach (Signed)
[facility] ([facility]) Care Management  THN Care Manager  07/05/2015   Cheryl [name] 12/30/1965 [id number]  Subjective:  Return call to patient for monthly outreach.  Patient reports she has been in a lot of pain recently.  Primary doctor seen last week. Referral for nerve block and rheumatology done.  Patient also reports annual mammogram and a pulmonary function test to be done. Patient reports taking hydrocodone for pain.  Discussed with patient the importance of pain management and taking pain medication before pain gets unbearable. She verbalized understanding.    Diabetes: Patient reports blood sugars creeping back up. Patient states blood sugar today was 260.  Discussed importance of really watching her diet especially carbohydrates.  She verbalized understanding.     Objective:   Current Medications:  Current Outpatient Prescriptions  Medication Sig Dispense Refill  . ACCU-CHEK FASTCLIX LANCETS MISC     . ACCU-CHEK SMARTVIEW test strip     . albuterol (PROVENTIL HFA;VENTOLIN HFA) 108 (90 BASE) MCG/ACT inhaler Inhale 2 puffs into the lungs every 6 (six) hours as needed for wheezing or shortness of breath. 3 Inhaler 1  . atorvastatin (LIPITOR) 40 MG tablet Take 40 mg by mouth daily.    . Azelastine-Fluticasone (DYMISTA) 137-50 MCG/ACT SUSP Place 2 sprays into the nose daily. 3 Bottle 1  . BD INSULIN SYRINGE ULTRAFINE 31G X 5/16" 0.3 ML MISC     . diazepam (VALIUM) 5 MG tablet Take 5 mg by mouth 2 (two) times daily as needed for anxiety.     . Diclofenac Sodium 3 % GEL Apply 1 application topically as needed. For pain    . diltiazem (CARDIZEM CD) 240 MG 24 hr capsule Take 240 mg by mouth daily.      . DULERA 100-5 MCG/ACT AERO INHALE 2 PUFFS INTO THE LUNGS TWICE DAILY 13 g 5  . DULoxetine (CYMBALTA) 60 MG capsule Take 60 mg by mouth daily.    . empagliflozin (JARDIANCE) 10 MG TABS tablet Take 10 mg by mouth daily.    . HYDROcodone-acetaminophen (NORCO) 7.5-325 MG per  tablet Take 2 tablets by mouth every 4 (four) hours as needed for moderate pain.     . ibuprofen (ADVIL,MOTRIN) 800 MG tablet Take 1 tablet (800 mg total) by mouth every 8 (eight) hours as needed for moderate pain. 30 tablet 0  . insulin regular human CONCENTRATED (HUMULIN R) 500 UNIT/ML SOLN injection Inject 50-100 Units into the skin See admin instructions. 500 Unit/ML-  125 units (25 unit markings on U-100 insulin syringe) 30 mins before breakfast,  90 units (18 unit marking on syringe) before lunch and 20 units (4 unit markings syringe) before evening meal [place] TID  (skip if missing a meal).    . ipratropium-albuterol (DUONEB) 0.5-2.5 (3) MG/3ML SOLN Take 3 mLs by nebulization every 2 (two) hours as needed (shortness of breath). 1080 mL 1  . ketoconazole (NIZORAL) 2 % cream Apply 1 application topically as needed. For rashes    . levothyroxine (SYNTHROID, LEVOTHROID) 100 MCG tablet Take 100 mcg by mouth daily before breakfast.    . nebivolol (BYSTOLIC) 10 MG tablet Take 1 tablet (10 mg total) by mouth daily. 30 tablet 11  . omeprazole (PRILOSEC) 40 MG capsule Take 40 mg by mouth daily.     . promethazine (PHENERGAN) 25 MG tablet Take 25 mg by mouth every 6 (six) hours as needed for nausea or vomiting.    . tiZANidine (ZANAFLEX) 4 MG capsule Take 4 mg by mouth   at bedtime. For muscle spasms    . Vitamin D, Ergocalciferol, (DRISDOL) 50000 UNITS CAPS Take 50,000 Units by mouth every 7 (seven) days. Takes on Wed     No current facility-administered medications for this visit.    Functional Status:  In your present state of health, do you have any difficulty performing the following activities: 01/09/2015 10/31/2014  Hearing? N N  Vision? - N  Difficulty concentrating or making decisions? N N  Walking or climbing stairs? Y Y  Dressing or bathing? N N  Doing errands, shopping? Y N  Preparing Food and eating ? N -  Using the Toilet? N -  In the past six months, have you accidently leaked urine? N -   Do you have problems with loss of bowel control? N -  Managing your Medications? Y -  Managing your Finances? N -  Housekeeping or managing your Housekeeping? Y -    Fall/Depression Screening: PHQ 2/9 Scores 05/29/2015 05/01/2015 04/03/2015 03/05/2015 02/06/2015 12/21/2014 10/31/2014  PHQ - 2 Score 2 2 2 2 2 0 2  PHQ- 9 Score 7 7 7 7 6 - 8    Assessment: Patient continues to benefit from health coach outreach for disease management.  Plan:  THN CM Care Plan Problem One        Most Recent Value   Care Plan Problem One  knowledge deficit related to diabetes type 2   Role Documenting the Problem One  Health Coach   Care Plan for Problem One  Active   THN Long Term Goal (31-90 days)  pt will maintain 7,14, 30 day average under 200 within 90 days.   THN Long Term Goal Start Date  05/29/15 [goal continued]   Interventions for Problem One Long Term Goal  RN Health Coach named items such as rice, potatoes, pasta, and breads to limit.     THN CM Short Term Goal #1 (0-30 days)  pt will walk 2-3 times weekly within 30 days.   THN CM Short Term Goal #1 Start Date  07/05/15 [goal continued]   Interventions for Short Term Goal #1  RN Health Coach reinforced importance of exercise.    THN CM Short Term Goal #2 (0-30 days)  pt will maintain 7, 14, 30 day averages below 200 within 30 days.   THN CM Short Term Goal #2 Start Date  10/31/14   THN CM Short Term Goal #2 Met Date  11/30/14   Interventions for Short Term Goal #2  RN CM reviewed importance of eating 3 meals per day and eating adequate protein, complex carbohydrates.    THN CM Care Plan Problem Two        Most Recent Value   Care Plan Problem Two  Alternation in activity related to back, left side pain [From [facility] system]   Role Documenting the Problem Two  Health Coach   Care Plan for Problem Two  Active   Interventions for Problem Two Long Term Goal   RN Health Coach discussed with patient importance of pain management.  Discussed with patient  importance of keeping rheumatology appointment.       THN Long Term Goal (31-90) days  Pt will have adequately managed pain within 90 days   THN Long Term Goal Start Date  07/05/15 [goal continued]   THN CM Short Term Goal #1 (0-30 days)  Pt will have effectively managed pain and increased mobility within 30 days   THN CM Short Term Goal #1 Start Date    01/09/15 [goal restarted]   THN CM Short Term Goal #1 Met Date   03/05/15   Interventions for Short Term Goal #2   Patient seeing cardiologists for chest pain. reviewed importance of taking pain medication as prescribed, having positive coping mechanisms to deal with stress, practice relaxation.    THN CM Care Plan Problem Three        Most Recent Value   Care Plan Problem Three  Pt having ongoing financial difficulties (refuses LCSW) [documentation from [facility] system]   Role Documenting the Problem Three  Care Management Coordinator   Care Plan for Problem Three  Not Active   THN Long Term Goal (31-90) days  Pt will verbalize financial situation improving and able to afford copays, pay all bills within 90 days.   THN Long Term Goal Start Date  10/03/14   THN Long Term Goal Met Date  11/30/14   THN CM Short Term Goal #1 (0-30 days)  Pt will go to local department of social services within 30 days and apply for medicaid and food stamps within 30 days   THN CM Short Term Goal #1 Start Date  10/03/14   THN CM Short Term Goal #1 Met Date  11/30/14 [pt no longer wants to work towards this goal.]   Interventions for Short Term Goal #1  talked with pt about going to social services to reapply for medicaid, food stamps, pt daughter now working and pt may not proceed forward with this.     RN Health will contact patient within one month and patient agrees to next outreach.   [name] J Leath, RN, MSN THN Care Management RN Telephonic Health Coach [phone]     

## 2015-07-10 ENCOUNTER — Ambulatory Visit
Admission: RE | Admit: 2015-07-10 | Discharge: 2015-07-10 | Disposition: A | Discharge status: Home / Self Care | Payer: Commercial Managed Care - HMO | Source: Ambulatory Visit

## 2015-07-10 DIAGNOSIS — Z1231 Encounter for screening mammogram for malignant neoplasm of breast: Secondary | ICD-10-CM | POA: Diagnosis not present

## 2015-07-11 ENCOUNTER — Ambulatory Visit: Payer: Commercial Managed Care - HMO

## 2015-07-11 DIAGNOSIS — M797 Fibromyalgia: Secondary | ICD-10-CM | POA: Diagnosis not present

## 2015-07-11 DIAGNOSIS — M546 Pain in thoracic spine: Secondary | ICD-10-CM | POA: Diagnosis not present

## 2015-07-11 DIAGNOSIS — G894 Chronic pain syndrome: Secondary | ICD-10-CM | POA: Diagnosis not present

## 2015-07-11 DIAGNOSIS — Z5181 Encounter for therapeutic drug level monitoring: Secondary | ICD-10-CM | POA: Diagnosis not present

## 2015-07-11 DIAGNOSIS — Z79899 Other long term (current) drug therapy: Secondary | ICD-10-CM | POA: Diagnosis not present

## 2015-07-11 DIAGNOSIS — M519 Unspecified thoracic, thoracolumbar and lumbosacral intervertebral disc disorder: Secondary | ICD-10-CM | POA: Diagnosis not present

## 2015-07-18 ENCOUNTER — Other Ambulatory Visit: Payer: Self-pay | Admitting: Family Medicine

## 2015-07-18 ENCOUNTER — Ambulatory Visit (INDEPENDENT_AMBULATORY_CARE_PROVIDER_SITE_OTHER): Payer: Commercial Managed Care - HMO | Admitting: Internal Medicine

## 2015-07-18 DIAGNOSIS — R928 Other abnormal and inconclusive findings on diagnostic imaging of breast: Secondary | ICD-10-CM

## 2015-07-18 DIAGNOSIS — J4530 Mild persistent asthma, uncomplicated: Secondary | ICD-10-CM | POA: Diagnosis not present

## 2015-07-18 LAB — PULMONARY FUNCTION TEST
DL/VA % pred: 114 %
DL/VA: 5.37 ml/min/mmHg/L
DLCO unc % pred: 113 %
DLCO unc: 26.07 ml/min/mmHg
FEF 25-75 Post: 3.88 L/sec
FEF 25-75 Pre: 2.46 L/sec
FEF2575-%Change-Post: 57 %
FEF2575-%Pred-Post: 140 %
FEF2575-%Pred-Pre: 89 %
FEV1-%Change-Post: 12 %
FEV1-%Pred-Post: 95 %
FEV1-%Pred-Pre: 85 %
FEV1-Post: 2.63 L
FEV1-Pre: 2.33 L
FEV1FVC-%Change-Post: 4 %
FEV1FVC-%Pred-Pre: 104 %
FEV6-%Change-Post: 8 %
FEV6-%Pred-Post: 90 %
FEV6-%Pred-Pre: 83 %
FEV6-Post: 3.03 L
FEV6-Pre: 2.79 L
FEV6FVC-%Pred-Post: 102 %
FEV6FVC-%Pred-Pre: 102 %
FVC-%Change-Post: 8 %
FVC-%Pred-Post: 88 %
FVC-%Pred-Pre: 81 %
FVC-Post: 3.03 L
FVC-Pre: 2.79 L
Post FEV1/FVC ratio: 87 %
Post FEV6/FVC ratio: 100 %
Pre FEV1/FVC ratio: 83 %
Pre FEV6/FVC Ratio: 100 %
RV % pred: 130 %
RV: 2.23 L
TLC % pred: 105 %
TLC: 5.18 L

## 2015-07-18 NOTE — Progress Notes (Signed)
PFT done today. 

## 2015-07-21 DIAGNOSIS — J4540 Moderate persistent asthma, uncomplicated: Secondary | ICD-10-CM | POA: Diagnosis not present

## 2015-07-24 ENCOUNTER — Ambulatory Visit
Admission: RE | Admit: 2015-07-24 | Discharge: 2015-07-24 | Disposition: A | Discharge status: Home / Self Care | Payer: Commercial Managed Care - HMO | Source: Ambulatory Visit | Attending: Family Medicine | Admitting: Family Medicine

## 2015-07-24 DIAGNOSIS — N63 Unspecified lump in breast: Secondary | ICD-10-CM | POA: Diagnosis not present

## 2015-07-24 DIAGNOSIS — N6489 Other specified disorders of breast: Secondary | ICD-10-CM | POA: Diagnosis not present

## 2015-07-24 DIAGNOSIS — R928 Other abnormal and inconclusive findings on diagnostic imaging of breast: Secondary | ICD-10-CM

## 2015-07-25 DIAGNOSIS — R5383 Other fatigue: Secondary | ICD-10-CM | POA: Diagnosis not present

## 2015-07-25 DIAGNOSIS — M25552 Pain in left hip: Secondary | ICD-10-CM | POA: Diagnosis not present

## 2015-07-25 DIAGNOSIS — K219 Gastro-esophageal reflux disease without esophagitis: Secondary | ICD-10-CM | POA: Diagnosis not present

## 2015-07-25 DIAGNOSIS — F419 Anxiety disorder, unspecified: Secondary | ICD-10-CM | POA: Diagnosis not present

## 2015-07-25 DIAGNOSIS — R52 Pain, unspecified: Secondary | ICD-10-CM | POA: Diagnosis not present

## 2015-07-25 DIAGNOSIS — I10 Essential (primary) hypertension: Secondary | ICD-10-CM | POA: Diagnosis not present

## 2015-07-25 DIAGNOSIS — R5382 Chronic fatigue, unspecified: Secondary | ICD-10-CM | POA: Diagnosis not present

## 2015-07-25 DIAGNOSIS — E119 Type 2 diabetes mellitus without complications: Secondary | ICD-10-CM | POA: Diagnosis not present

## 2015-07-25 DIAGNOSIS — R21 Rash and other nonspecific skin eruption: Secondary | ICD-10-CM | POA: Diagnosis not present

## 2015-07-25 DIAGNOSIS — Z8739 Personal history of other diseases of the musculoskeletal system and connective tissue: Secondary | ICD-10-CM | POA: Diagnosis not present

## 2015-07-25 DIAGNOSIS — M797 Fibromyalgia: Secondary | ICD-10-CM | POA: Diagnosis not present

## 2015-07-25 DIAGNOSIS — D72829 Elevated white blood cell count, unspecified: Secondary | ICD-10-CM | POA: Diagnosis not present

## 2015-07-25 DIAGNOSIS — M549 Dorsalgia, unspecified: Secondary | ICD-10-CM | POA: Diagnosis not present

## 2015-07-25 DIAGNOSIS — N189 Chronic kidney disease, unspecified: Secondary | ICD-10-CM | POA: Diagnosis not present

## 2015-07-26 DIAGNOSIS — M5414 Radiculopathy, thoracic region: Secondary | ICD-10-CM | POA: Diagnosis not present

## 2015-07-27 ENCOUNTER — Telehealth: Payer: Self-pay | Admitting: Internal Medicine

## 2015-07-27 NOTE — Telephone Encounter (Signed)
I have posted in "Results", thanks

## 2015-07-27 NOTE — Telephone Encounter (Signed)
Pt had PFT done 07/18/15. Wants results. Please advise Dr. [name] thanks

## 2015-07-27 NOTE — Telephone Encounter (Signed)
Patient notified of PFT results. Nothing further needed.

## 2015-08-02 ENCOUNTER — Ambulatory Visit: Payer: Self-pay

## 2015-08-02 ENCOUNTER — Other Ambulatory Visit: Payer: Self-pay

## 2015-08-02 NOTE — Patient Outreach (Signed)
[facility]) Care Management  08/02/2015  Cheryl [name] Jan 13, 1966 [id number]   Telephone call to patient for monthly outreach.  No answer.  HIPAA compliant voice message left.    Plan: RN Health Coach will contact patient within 1-2 weeks.    [name], RN, MSN [place] [phone]

## 2015-08-06 ENCOUNTER — Encounter: Payer: Self-pay | Admitting: Licensed Clinical Social Worker

## 2015-08-06 ENCOUNTER — Other Ambulatory Visit: Payer: Self-pay | Admitting: Licensed Clinical Social Worker

## 2015-08-06 NOTE — Patient Outreach (Signed)
Assessment:  CSW called client home phone number on 08/06/15. CSW spoke via phone with client on 08/06/15. CSW verified client identity..  CSW and client spoke of client needs.  Client said she has been communicating with RN Cheryl [name], [place] about health needs of client.  Client said she was planning on calling Cheryl Billings RN today to further discuss with Cheryl [name] the health needs of client. CSW talked with client about client care plan.  Client has not been working towards meeting her care plan goal with [facility] CSW services. Client and CSW agreed that CSW would discharge client from [place] services only on 08/06/15.  Client will continue to receive telephonic support with [place], Cheryl [name].  Client agreed to this plan.  CSW thanked client for phone call with CSW on 08/06/15.   Plan:  CSW is discharging Cheryl [name]. [name] from [place] services only on 08/05/14 since client is not working towards meeting her care plan goal with [facility] CSW services. CSW to inform Cheryl [name] that [place] discharged client from [place] services only on 08/06/15. CSW to fax letter to Cheryl [name] informing Cheryl [name] that [place] discharged client on 08/06/15 from [place] services only.  Cheryl [name].Cheryl [name] MSW, LCSW Licensed Clinical Social Worker [facility] Care Management [phone]

## 2015-08-08 ENCOUNTER — Other Ambulatory Visit: Payer: Self-pay

## 2015-08-08 NOTE — Patient Outreach (Signed)
[place] [facility]) Care Management  08/08/2015  Wanisha Shiroma Foree Jul 08, 1966 [id number]   Return phone call to patient.  Patient states she is on the phone with [facility] and states she will call health coach back.    Plan: RN Health will plan to reach out to patient again within 1 week if no return call.    [name], RN, MSN [place] [phone]

## 2015-08-13 ENCOUNTER — Other Ambulatory Visit: Payer: Self-pay

## 2015-08-13 NOTE — Patient Outreach (Signed)
[facility]) Care Management  [place]  08/13/2015   Cheryl [name] 12-11-1965 [id number]  Subjective: Telephone call to patient for monthly outreach.  Patient reports that she continues to have lots of pain.  She reports that she saw pain management and was given a steroid injection that did not work and that it ran her sugars up.  Advised patient to always inform physician of her diabetes and how steroids affect.  She verbalized understanding and states that she will not be getting anymore injections as the last one did not work.  Patient reports she is in between doctors as Dr. Amedeo [name] will be leaving the practice and she did not get to see her.  However, patient reports that she will be seeing Dr. Delfina [name] after Feb. 2nd due to insurance matter.  Patient states she should have enough medication until then but states the office had not called her to set up her appointment.  Advised patient to call the office to set up her appointment.  She verbalized understanding. Patient reports she saw the rheumatologist and that she is concerned about patient having rheumatoid arthritis and lupus but wants her to see her primary doctor.  Patient reports that the rheumatologist did do sed rate, platelets, and kidney function test and they were increased warranting her to see her primary doctor also.    Diabetes: Patient reports her blood sugars have been high to the point it did not register on her machine due to steroid injections but she reports now they are in the 200's mainly.  Discussed continuing diet as close as possible.  She verbalized understanding.    Objective:   Current Medications:  Current Outpatient Prescriptions  Medication Sig Dispense Refill  . ACCU-CHEK FASTCLIX LANCETS MISC     . ACCU-CHEK SMARTVIEW test strip     . albuterol (PROVENTIL HFA;VENTOLIN HFA) 108 (90 BASE) MCG/ACT inhaler Inhale 2 puffs into the lungs every 6 (six) hours as needed for wheezing or  shortness of breath. 3 Inhaler 1  . atorvastatin (LIPITOR) 40 MG tablet Take 40 mg by mouth daily.    . Azelastine-Fluticasone (DYMISTA) 137-50 MCG/ACT SUSP Place 2 sprays into the nose daily. 3 Bottle 1  . BD INSULIN SYRINGE ULTRAFINE 31G X 5/16" 0.3 ML MISC     . diazepam (VALIUM) 5 MG tablet Take 5 mg by mouth 2 (two) times daily as needed for anxiety.     . Diclofenac Sodium 3 % GEL Apply 1 application topically as needed. For pain    . diltiazem (CARDIZEM CD) 240 MG 24 hr capsule Take 240 mg by mouth daily.      . DULERA 100-5 MCG/ACT AERO INHALE 2 PUFFS INTO THE LUNGS TWICE DAILY 13 g 5  . DULoxetine (CYMBALTA) 60 MG capsule Take 60 mg by mouth daily.    . empagliflozin (JARDIANCE) 10 MG TABS tablet Take 10 mg by mouth daily.    [name] HYDROcodone-acetaminophen (NORCO) 7.5-325 MG per tablet Take 2 tablets by mouth every 4 (four) hours as needed for moderate pain.     [name] ibuprofen (ADVIL,MOTRIN) 800 MG tablet Take 1 tablet (800 mg total) by mouth every 8 (eight) hours as needed for moderate pain. 30 tablet 0  . insulin regular human CONCENTRATED (HUMULIN R) 500 UNIT/ML SOLN injection Inject 50-100 Units into the skin See admin instructions. 500 Unit/ML-  125 units (25 unit markings on U-100 insulin syringe) 30 mins before breakfast,  90 units (18 unit marking on  syringe) before lunch and 20 units (4 unit markings syringe) before evening meal [place] TID  (skip if missing a meal).    [name] ipratropium-albuterol (DUONEB) 0.5-2.5 (3) MG/3ML SOLN Take 3 mLs by nebulization every 2 (two) hours as needed (shortness of breath). 1080 mL 1  . ketoconazole (NIZORAL) 2 % cream Apply 1 application topically as needed. For rashes    . levothyroxine (SYNTHROID, LEVOTHROID) 100 MCG tablet Take 100 mcg by mouth daily before breakfast.    . nebivolol (BYSTOLIC) 10 MG tablet Take 1 tablet (10 mg total) by mouth daily. 30 tablet 11  . omeprazole (PRILOSEC) 40 MG capsule Take 40 mg by mouth daily.     . promethazine (PHENERGAN)  25 MG tablet Take 25 mg by mouth every 6 (six) hours as needed for nausea or vomiting.    [name] tiZANidine (ZANAFLEX) 4 MG capsule Take 4 mg by mouth at bedtime. For muscle spasms    . Vitamin D, Ergocalciferol, (DRISDOL) 50000 UNITS CAPS Take 50,000 Units by mouth every 7 (seven) days. Takes on Wed     No current facility-administered medications for this visit.    Functional Status:  In your present state of health, do you have any difficulty performing the following activities: 08/06/2015 01/09/2015  Hearing? N N  Vision? Y -  Difficulty concentrating or making decisions? N N  Walking or climbing stairs? Y Y  Dressing or bathing? N N  Doing errands, shopping? [name]  Preparing Food and eating ? N N  Using the Toilet? N N  In the past six months, have you accidently leaked urine? N N  Do you have problems with loss of bowel control? N N  Managing your Medications? Y Y  Managing your Finances? N N  Housekeeping or managing your Housekeeping? [name]    Fall/Depression Screening: [facility] 2/9 Scores 08/13/2015 08/06/2015 05/29/2015 05/01/2015 04/03/2015 03/05/2015 02/06/2015  PHQ - 2 Score _0 PHQ- 9 Score _1 Assessment: Patient continues to benefit from health coach outreach for disease management and support.  Plan:  [facility] CM Care Plan Problem One        Most Recent Value   Care Plan Problem One  knowledge deficit related to diabetes type 2   Role Documenting the Problem One  Health [place] for Problem One  Active   THN Long Term Goal (31-90 days)  Patient will maintain blood sugar average under 200 within 90 days.   THN Long Term Goal Start Date  08/13/15 [goal continued]   Interventions for Problem One Long Term Goal  RN Health Coach discussed diabetic diet and importance of alerting physicians of diabetes to avoid steroid injections.   THN CM Short Term Goal #1 (0-30 days)  pt will walk 2-3 times weekly within 30 days.   THN CM Short Term Goal #1 Start Date   07/05/15   THN CM Short Term Goal #1 Met Date  -- [patient no longer working toward goal at this time.]   Interventions for Short Term Goal #1  [place] reinforced importance of exercise.     THN CM Care Plan Problem Two        Most Recent Value   Care Plan Problem Two  Alternation in activity related to back, left side pain [From [facility] system]   Role Documenting the Problem [place]  for Problem Two  Active   Interventions for Problem Two Long Term Goal   Patient saw rheumatologist.  Patient seeing pain management regularly.    THN Long Term Goal (31-90) days  Pt will have adequately managed pain within 90 days   THN Long Term Goal Start Date  07/05/15 [facility] continued]     [place] will contact patient within one month and patient agrees to next outreach.    [name], RN, MSN [place] [phone]

## 2015-08-21 DIAGNOSIS — J4540 Moderate persistent asthma, uncomplicated: Secondary | ICD-10-CM | POA: Diagnosis not present

## 2015-08-29 DIAGNOSIS — B370 Candidal stomatitis: Secondary | ICD-10-CM | POA: Diagnosis not present

## 2015-08-29 DIAGNOSIS — E039 Hypothyroidism, unspecified: Secondary | ICD-10-CM | POA: Diagnosis not present

## 2015-08-29 DIAGNOSIS — E782 Mixed hyperlipidemia: Secondary | ICD-10-CM | POA: Diagnosis not present

## 2015-08-29 DIAGNOSIS — E1165 Type 2 diabetes mellitus with hyperglycemia: Secondary | ICD-10-CM | POA: Diagnosis not present

## 2015-08-29 DIAGNOSIS — Z794 Long term (current) use of insulin: Secondary | ICD-10-CM | POA: Diagnosis not present

## 2015-08-29 DIAGNOSIS — E1142 Type 2 diabetes mellitus with diabetic polyneuropathy: Secondary | ICD-10-CM | POA: Diagnosis not present

## 2015-09-05 DIAGNOSIS — R5382 Chronic fatigue, unspecified: Secondary | ICD-10-CM | POA: Diagnosis not present

## 2015-09-05 DIAGNOSIS — D72829 Elevated white blood cell count, unspecified: Secondary | ICD-10-CM | POA: Diagnosis not present

## 2015-09-05 DIAGNOSIS — Z79899 Other long term (current) drug therapy: Secondary | ICD-10-CM | POA: Diagnosis not present

## 2015-09-05 DIAGNOSIS — M7590 Shoulder lesion, unspecified, unspecified shoulder: Secondary | ICD-10-CM | POA: Diagnosis not present

## 2015-09-05 DIAGNOSIS — Z794 Long term (current) use of insulin: Secondary | ICD-10-CM | POA: Diagnosis not present

## 2015-09-05 DIAGNOSIS — M2550 Pain in unspecified joint: Secondary | ICD-10-CM | POA: Diagnosis not present

## 2015-09-05 DIAGNOSIS — R21 Rash and other nonspecific skin eruption: Secondary | ICD-10-CM | POA: Diagnosis not present

## 2015-09-05 DIAGNOSIS — M7580 Other shoulder lesions, unspecified shoulder: Secondary | ICD-10-CM | POA: Diagnosis not present

## 2015-09-05 DIAGNOSIS — R5383 Other fatigue: Secondary | ICD-10-CM | POA: Diagnosis not present

## 2015-09-05 DIAGNOSIS — M797 Fibromyalgia: Secondary | ICD-10-CM | POA: Diagnosis not present

## 2015-09-05 DIAGNOSIS — G8929 Other chronic pain: Secondary | ICD-10-CM | POA: Diagnosis not present

## 2015-09-07 DIAGNOSIS — R109 Unspecified abdominal pain: Secondary | ICD-10-CM | POA: Diagnosis not present

## 2015-09-10 ENCOUNTER — Ambulatory Visit: Payer: Self-pay

## 2015-09-10 ENCOUNTER — Other Ambulatory Visit: Payer: Self-pay

## 2015-09-10 NOTE — Patient Outreach (Signed)
[name]) Care Management  09/10/2015  Cheryl [name] [birth year] [id number]   Telephone call to patient for monthly outreach.  No answer.  HIPAA compliant voice message left.    Plan: RN Health Coach will contact patient within 1-2 weeks.    [name], RN, MSN [place] [phone]

## 2015-09-17 ENCOUNTER — Ambulatory Visit: Payer: Self-pay

## 2015-09-17 ENCOUNTER — Other Ambulatory Visit: Payer: Self-pay

## 2015-09-17 DIAGNOSIS — M546 Pain in thoracic spine: Secondary | ICD-10-CM | POA: Diagnosis not present

## 2015-09-17 DIAGNOSIS — G894 Chronic pain syndrome: Secondary | ICD-10-CM | POA: Diagnosis not present

## 2015-09-17 DIAGNOSIS — M797 Fibromyalgia: Secondary | ICD-10-CM | POA: Diagnosis not present

## 2015-09-17 NOTE — Patient Outreach (Signed)
[place] [facility]) Care Management  09/17/2015  PERLE BRICKHOUSE 08/24/1965 [id number]   2nd telephone call to patient for monthly call.  No answer.  HIPAA compliant voice message left.    Plan: RN Health Coach will contact patient within 1-2 weeks.    [name], RN, MSN [place] [phone]

## 2015-09-21 ENCOUNTER — Other Ambulatory Visit: Payer: Self-pay

## 2015-09-21 DIAGNOSIS — J4540 Moderate persistent asthma, uncomplicated: Secondary | ICD-10-CM | POA: Diagnosis not present

## 2015-09-21 NOTE — Patient Outreach (Addendum)
Cheryl [name]) Care Management  [name]  09/21/2015   Cheryl [name] [birth year] [id number]  Subjective: Telephone call to patient for monthly call. Patient reports that she still having pain issues. Patient reports that she is seeing pain management and has been ordered Topamax.  Patient reports that the pain management doctor also discussed possible use of ketamine for pain management.  However, patient reports she wants to talk it over with her new primary care Dr. [name].  Patient reports seeing Dr. [name] on 09-07-15 and will see again on 10-05-15. Discussed with patient pain management and different options.  She verbalized understanding.    Diabetes: Patient also saw Dr. [name] recently. A1c was 7.9 which is an increase.  Discussed with patient reasons of possible steroid injections and reminded of maintaining a low carbohydrate diet.  She verbalized understanding.    Objective:   Current Medications:  Current Outpatient Prescriptions  Medication Sig Dispense Refill  . ACCU-CHEK FASTCLIX LANCETS MISC     . ACCU-CHEK SMARTVIEW test strip     . albuterol (PROVENTIL HFA;VENTOLIN HFA) 108 (90 BASE) MCG/ACT inhaler Inhale 2 puffs into the lungs every 6 (six) hours as needed for wheezing or shortness of breath. 3 Inhaler 1  . atorvastatin (LIPITOR) 40 MG tablet Take 40 mg by mouth daily.    . Azelastine-Fluticasone (DYMISTA) 137-50 MCG/ACT SUSP Place 2 sprays into the nose daily. 3 Bottle 1  . BD INSULIN SYRINGE ULTRAFINE 31G X 5/16" 0.3 ML MISC     . Diclofenac Sodium 3 % GEL Apply 1 application topically as needed. For pain    . diltiazem (CARDIZEM CD) 240 MG 24 hr capsule Take 240 mg by mouth daily.      . DULERA 100-5 MCG/ACT AERO INHALE 2 PUFFS INTO THE LUNGS TWICE DAILY 13 g 5  . DULoxetine (CYMBALTA) 60 MG capsule Take 60 mg by mouth daily.    . empagliflozin (JARDIANCE) 10 MG TABS tablet Take 10 mg by mouth daily.    [name] HYDROcodone-acetaminophen (NORCO)  7.5-325 MG per tablet Take 2 tablets by mouth every 4 (four) hours as needed for moderate pain.     [name] ibuprofen (ADVIL,MOTRIN) 800 MG tablet Take 1 tablet (800 mg total) by mouth every 8 (eight) hours as needed for moderate pain. 30 tablet 0  . insulin regular human CONCENTRATED (HUMULIN R) 500 UNIT/ML SOLN injection Inject 50-100 Units into the skin See admin instructions. 500 Unit/ML-  125 units (25 unit markings on U-100 insulin syringe) 30 mins before breakfast,  90 units (18 unit marking on syringe) before lunch and 20 units (4 unit markings syringe) before evening meal [place] TID  (skip if missing a meal).    [name] ipratropium-albuterol (DUONEB) 0.5-2.5 (3) MG/3ML SOLN Take 3 mLs by nebulization every 2 (two) hours as needed (shortness of breath). 1080 mL 1  . ketoconazole (NIZORAL) 2 % cream Apply 1 application topically as needed. For rashes    . levothyroxine (SYNTHROID, LEVOTHROID) 100 MCG tablet Take 100 mcg by mouth daily before breakfast.    . nebivolol (BYSTOLIC) 10 MG tablet Take 1 tablet (10 mg total) by mouth daily. 30 tablet 11  . omeprazole (PRILOSEC) 40 MG capsule Take 40 mg by mouth daily.     . promethazine (PHENERGAN) 25 MG tablet Take 25 mg by mouth every 6 (six) hours as needed for nausea or vomiting.    [name] tiZANidine (ZANAFLEX) 4 MG capsule Take 4 mg by mouth at bedtime. For  muscle spasms    . topiramate (TOPAMAX) 50 MG tablet Take 50 mg by mouth daily.    . Vitamin D, Ergocalciferol, (DRISDOL) 50000 UNITS CAPS Take 50,000 Units by mouth every 7 (seven) days. Takes on Wed    . diazepam (VALIUM) 5 MG tablet Take 5 mg by mouth 2 (two) times daily as needed for anxiety. Reported on 09/21/2015     No current facility-administered medications for this visit.    Functional Status:  In your present state of health, do you have any difficulty performing the following activities: 08/06/2015 01/09/2015  Hearing? N N  Vision? Y -  Difficulty concentrating or making decisions? N N  Walking or  climbing stairs? Y Y  Dressing or bathing? N N  Doing errands, shopping? Cheryl [name]  Preparing Food and eating ? N N  Using the Toilet? N N  In the past six months, have you accidently leaked urine? N N  Do you have problems with loss of bowel control? N N  Managing your Medications? Y Y  Managing your Finances? N N  Housekeeping or managing your Housekeeping? Cheryl [name]    Fall/Depression Screening: [facility] 2/9 Scores 09/21/2015 08/13/2015 08/06/2015 05/29/2015 05/01/2015 04/03/2015 03/05/2015  PHQ - 2 Score 2 2 2 2 2 2 2   PHQ- 9 Score 7 7 7 7 7 7 7     Assessment: Patient continues to benefit from health coach outreach for disease management.   Plan:  [facility] CM Care Plan Problem One        Most Recent Value   Care Plan Problem One  knowledge deficit related to diabetes type 2   Role Documenting the Problem One  Health [place] for Problem One  Active   THN Long Term Goal (31-90 days)  Patient will maintain blood sugar average under 200 within 90 days.   THN Long Term Goal Start Date  08/13/15 [goal continued]   Interventions for Problem One Long Term Goal  RN Health Coach reviewed with patient diabetic diet and maintaining low carbohydrates.      THN CM Care Plan Problem Two        Most Recent Value   Care Plan Problem Two  Alternation in activity related to back, left side pain [From [facility] system]   Role Documenting the Problem Two  [place] for Problem Two  Active   Interventions for Problem Two Long Term Goal   RN Health Coach discussed with patient different pain medicaton options for pain management.    THN Long Term Goal (31-90) days  Pt will have adequately managed pain within 90 days   THN Long Term Goal Start Date  09/21/15 [facility] continued]     [place] will contact patient within one month and patient agrees to next outreach.  RN Health Coach will sent communication to new primary doctor of THN involvement.    Cheryl Baseman, RN, MSN [place] [phone]

## 2015-10-05 DIAGNOSIS — D72829 Elevated white blood cell count, unspecified: Secondary | ICD-10-CM | POA: Diagnosis not present

## 2015-10-05 DIAGNOSIS — J383 Other diseases of vocal cords: Secondary | ICD-10-CM | POA: Diagnosis not present

## 2015-10-05 DIAGNOSIS — M797 Fibromyalgia: Secondary | ICD-10-CM | POA: Diagnosis not present

## 2015-10-09 ENCOUNTER — Encounter: Payer: Self-pay | Admitting: Hematology & Oncology

## 2015-10-16 ENCOUNTER — Ambulatory Visit: Payer: Self-pay

## 2015-10-16 ENCOUNTER — Other Ambulatory Visit: Payer: Self-pay

## 2015-10-16 DIAGNOSIS — M797 Fibromyalgia: Secondary | ICD-10-CM | POA: Diagnosis not present

## 2015-10-16 DIAGNOSIS — M546 Pain in thoracic spine: Secondary | ICD-10-CM | POA: Diagnosis not present

## 2015-10-16 DIAGNOSIS — G894 Chronic pain syndrome: Secondary | ICD-10-CM | POA: Diagnosis not present

## 2015-10-16 NOTE — Patient Outreach (Signed)
[facility]) Care Management  10/16/2015  Cheryl [name] 1965-12-10 [id number]   Telephone call to patient for monthly outreach.  No answer.  HIPAA compliant voice message left.    Plan: RN Health Coach will contact patient within 1-2 weeks.  [name], RN, MSN [name] [phone]

## 2015-10-18 ENCOUNTER — Telehealth: Payer: Self-pay | Admitting: Internal Medicine

## 2015-10-18 NOTE — Telephone Encounter (Signed)
Spoke with patient- states that she does want a sooner appt than January 10, 2016 if able to work in with [place].  Pt states that she has a hard time eating at times d/t throat spasms. Pt also notes some increased SOB and total loss of voice at times.  Pt also reports still having some chest discomfort with her SOB under her left breast - pt states that she also has fibromyalgia which could be contributing to her problems.  Pt is scheduled for a Ketamine Infusion March 30 for her nerve pain/fibromyalgia.   Please advise Dr [name] if able to work patient in sooner. Patient is okay with seeing another provider if unable to work in with [place]. Thanks.

## 2015-10-19 ENCOUNTER — Other Ambulatory Visit: Payer: Self-pay

## 2015-10-19 DIAGNOSIS — J4540 Moderate persistent asthma, uncomplicated: Secondary | ICD-10-CM | POA: Diagnosis not present

## 2015-10-19 NOTE — Patient Outreach (Addendum)
[facility]) Care Management  [place]  10/19/2015   Cheryl [name] 1966/02/06 [id number]  Subjective: Telephone call to patient for monthly call.  Patient reports she is doing ok but still having pain.  Patient rating pain at 10/10.  Patient reports she went to [facility] in [place].  She reports she is going to get a ketamine infusion on next week as recommended by the doctor there.  She reports she has discussed it with her primary doctor. Patient also shares she saw her primary again on 10-05-15 and he did testing and will be requesting more records on her.  Patient reports she is also having some questionable throat spasms that causes some problems with swallowing. She reports that primary doctor is aware and is working on that as well.  Patient reports her sugars are about the same. 7,14, and 30 day averages given by patient.  Discussed pain control and diabetic control with patient. She verbalized understanding. No concerns.    Objective:   Current Medications:  Current Outpatient Prescriptions  Medication Sig Dispense Refill  . ACCU-CHEK FASTCLIX LANCETS MISC     . ACCU-CHEK SMARTVIEW test strip     . albuterol (PROVENTIL HFA;VENTOLIN HFA) 108 (90 BASE) MCG/ACT inhaler Inhale 2 puffs into the lungs every 6 (six) hours as needed for wheezing or shortness of breath. 3 Inhaler 1  . atorvastatin (LIPITOR) 40 MG tablet Take 40 mg by mouth daily.    . Azelastine-Fluticasone (DYMISTA) 137-50 MCG/ACT SUSP Place 2 sprays into the nose daily. 3 Bottle 1  . BD INSULIN SYRINGE ULTRAFINE 31G X 5/16" 0.3 ML MISC     . diazepam (VALIUM) 5 MG tablet Take 5 mg by mouth 2 (two) times daily as needed for anxiety. Reported on 09/21/2015    . Diclofenac Sodium 3 % GEL Apply 1 application topically as needed. For pain    . diltiazem (CARDIZEM CD) 240 MG 24 hr capsule Take 240 mg by mouth daily.      . DULERA 100-5 MCG/ACT AERO INHALE 2 PUFFS INTO THE LUNGS TWICE  DAILY 13 g 5  . DULoxetine (CYMBALTA) 60 MG capsule Take 60 mg by mouth daily.    . empagliflozin (JARDIANCE) 10 MG TABS tablet Take 10 mg by mouth daily.    [name] HYDROcodone-acetaminophen (NORCO) 7.5-325 MG per tablet Take 2 tablets by mouth every 4 (four) hours as needed for moderate pain.     [name] ibuprofen (ADVIL,MOTRIN) 800 MG tablet Take 1 tablet (800 mg total) by mouth every 8 (eight) hours as needed for moderate pain. 30 tablet 0  . insulin regular human CONCENTRATED (HUMULIN R) 500 UNIT/ML SOLN injection Inject 50-100 Units into the skin See admin instructions. 500 Unit/ML-  125 units (25 unit markings on U-100 insulin syringe) 30 mins before breakfast,  90 units (18 unit marking on syringe) before lunch and 20 units (4 unit markings syringe) before evening meal [name] TID  (skip if missing a meal).    [name] ipratropium-albuterol (DUONEB) 0.5-2.5 (3) MG/3ML SOLN Take 3 mLs by nebulization every 2 (two) hours as needed (shortness of breath). 1080 mL 1  . ketoconazole (NIZORAL) 2 % cream Apply 1 application topically as needed. For rashes    . levothyroxine (SYNTHROID, LEVOTHROID) 100 MCG tablet Take 100 mcg by mouth daily before breakfast.    . nebivolol (BYSTOLIC) 10 MG tablet Take 1 tablet (10 mg total) by mouth daily. 30 tablet 11  . omeprazole (PRILOSEC) 40  MG capsule Take 40 mg by mouth daily.     . promethazine (PHENERGAN) 25 MG tablet Take 25 mg by mouth every 6 (six) hours as needed for nausea or vomiting.    [name] tiZANidine (ZANAFLEX) 4 MG capsule Take 4 mg by mouth at bedtime. For muscle spasms    . topiramate (TOPAMAX) 50 MG tablet Take 50 mg by mouth daily.    . Vitamin D, Ergocalciferol, (DRISDOL) 50000 UNITS CAPS Take 50,000 Units by mouth every 7 (seven) days. Takes on Wed     No current facility-administered medications for this visit.    Functional Status:  In your present state of health, do you have any difficulty performing the following activities: 08/06/2015 01/09/2015  Hearing? N N   Vision? Y -  Difficulty concentrating or making decisions? N N  Walking or climbing stairs? Y Y  Dressing or bathing? N N  Doing errands, shopping? [name]  Preparing Food and eating ? N N  Using the Toilet? N N  In the past six months, have you accidently leaked urine? N N  Do you have problems with loss of bowel control? N N  Managing your Medications? Y Y  Managing your Finances? N N  Housekeeping or managing your Housekeeping? [name]    Fall/Depression Screening: [name] 2/9 Scores 09/21/2015 08/13/2015 08/06/2015 05/29/2015 05/01/2015 04/03/2015 03/05/2015  PHQ - 2 Score 2 2 2 2 2 2 2   PHQ- 9 Score 7 7 7 7 7 7 7     Assessment: Patient continues to benefit from health coach outreach for disease management and support.    Plan:  [facility] CM Care Plan Problem One        Most Recent Value   Care Plan Problem One  knowledge deficit related to diabetes type 2   Role Documenting the Problem One  Health [place] for Problem One  Active   [facility] Long Term Goal (31-90 days)  Patient will maintain blood sugar average under 200 within 90 days.   THN Long Term Goal Start Date  10/19/15 [goal continued]   Interventions for Problem One Long Term Goal  RN Health Coach discussed with patient diabetic diet and maintaining low carbohydrates.      [facility] CM Care Plan Problem Two        Most Recent Value   Care Plan Problem Two  Alternation in activity related to back, left side pain [From [facility]]   Role Documenting the Problem Two  [place] for Problem Two  Active   Interventions for Problem Two Long Term Goal   RN Health Coach reviewed with patient different pain medicaton options for pain management.  Patient to receive Ketamine infusion from pain management.     THN Long Term Goal (31-90) days  Pt will have adequately managed pain within 90 days   THN Long Term Goal Start Date  10/19/15 [facility] continued]     [place] will contact patient within one month and patient agrees to  next outreach.    [name], RN, MSN [place] [phone]

## 2015-10-22 ENCOUNTER — Other Ambulatory Visit: Payer: Self-pay | Admitting: Cardiovascular Disease

## 2015-10-22 NOTE — Telephone Encounter (Signed)
Dr. [name], please advise if ok for patient to see another provider or can we work her in on your schedule?

## 2015-10-22 NOTE — Telephone Encounter (Signed)
Pt can be seen in one of our held spots for tomorrow 10-23-2015. Thanks.

## 2015-10-22 NOTE — Telephone Encounter (Signed)
Please see if patient can be seen Wednesday 10-24-15 in either held spot or Friday 10-26-15 at 11:15am. Thanks.

## 2015-10-22 NOTE — Telephone Encounter (Signed)
Spoke with pt. She has been scheduled to see CY on 10/24/15 at 9:30am. Nothing further was needed.

## 2015-10-22 NOTE — Telephone Encounter (Signed)
Spoke with pt, states she cannot make appt tomorrow d/t a family member having an appt tomorrow morning.   Please advise on another time pt can come in to be seen.  Thanks!

## 2015-10-24 ENCOUNTER — Encounter: Payer: Self-pay | Admitting: Gastroenterology

## 2015-10-24 ENCOUNTER — Ambulatory Visit (INDEPENDENT_AMBULATORY_CARE_PROVIDER_SITE_OTHER): Payer: Commercial Managed Care - HMO | Admitting: Internal Medicine

## 2015-10-24 ENCOUNTER — Encounter: Payer: Self-pay | Admitting: Internal Medicine

## 2015-10-24 VITALS — BP 120/68 | HR 88 | Ht 62.0 in | Wt 213.6 lb

## 2015-10-24 DIAGNOSIS — J4530 Mild persistent asthma, uncomplicated: Secondary | ICD-10-CM | POA: Diagnosis not present

## 2015-10-24 DIAGNOSIS — J383 Other diseases of vocal cords: Secondary | ICD-10-CM | POA: Diagnosis not present

## 2015-10-24 DIAGNOSIS — R1310 Dysphagia, unspecified: Secondary | ICD-10-CM

## 2015-10-24 DIAGNOSIS — F411 Generalized anxiety disorder: Secondary | ICD-10-CM

## 2015-10-24 NOTE — Progress Notes (Signed)
Subjective:    Patient ID: Cheryl [name], female    DOB: 1966/04/10, 50 y.o.   MRN: [id number]  Asthma Her past medical history is significant for asthma.   June 24, 2010- Allergic rhinitis, OSA, DM, GERD..........................[name] here  Nurse-CC: 4 month follow up visit-wheezing, few asthma attacks, sneezing(using nasal sprays). Sleep- waking up "alot" and other days sleeping all day and not able to wake up well enough.  Now on cefdinir for sinusitis. Sneezing all Fall. Had [facility] sinus at [facility]- "chronic sinusitis".  Mother reports fall 2 weeks ago- fell onto knees and hands when knees buckled. [name] now when she stands. Evaluated at [facility]. Anticipate referral to [facility] neurology. Had flu vax. Still lives same house.  Continues CPAP. When too tight she got pressure marks- discussed. Not smoking. Getting divorced- looking at options.   August 28, 2010-Allergic rhinitis, Asthma, OSA, DM, GERD.............[name] here  Nurse-CC: Follow up per [name], FNP. Pt c/o chest pressure and tightness, pains in chest when breathing, cough with very little yellow mucus, increased SOB at rest and with activity x 1 wk.  CPAP 10- Usually using it all night every night. Has had to skip this week while ill.  Says she has been on abx since October for sinusitis- did CT sinus. Gets better then worse. In last 1-2 weeks, increased cough and wheeze , tussive soreness mid anterior and left lateral chest. CBC and CXR at [name]. Says 2 days ago WBC 14000, given nebs. Now on doxy 229m daily.  [facility].   11/11/10- Acute OV-Walk in  Complains of increased SOB, wheezing, dry cough, chest congestion, low grade temp x2days. Has a lot of sinus drainage, cough, post nasal drip and wheezing. Wears CPAP at night, no interference. Teeth hurt at  at times. Has fatigue and no energy.  Has been taking zyrtec without much help. Very dry in sinuses. Frontal headache.    12/27/10- Allergic rhinitis, Asthma, OSA, complicated by DM, GERD  Mother here After seeing TP in April got welll after 2 rounds of omnicef. Tongue feels funny. Diabetic control has not been good.  Breathing has been good.   07/08/11- 89YOF former smoker, followed for asthma, allergic rhinitis, OSA/ failed CPAP, complicated by DM, GERD.   Mother here and offers her observations.  Says she felt okay until yesterday. She couldn't walk well, was falling and had gone back to using her walker. They called 911 and they told her she was okay except glucose was high. Mother wonders about "inner ear"/vertigo. Treated with eardrops in October for otitis left ear. Has Bell's palsy off and on causing some weakness in the left lower face which other associates with episodes of viral illness such as bronchitis, or allergy. [name], [name], with a total of 3 rounds of antibiotics this fall.  She now says that cough began about the time she began ramipril from Dr KDwyane Dee    04/29/12- 89YOF former smoker, followed for asthma, allergic rhinitis, OSA/ failed CPAP, complicated by DM, GERD.  Daughter here.  Has had flu shot. Cough, wheezing x 4 days; states friend of hers passed last Friday with MRSA.  Recent cold with increased wheeze, cough, sweating, hoarseness. Reports temperature 101. Scant sputum was green and nasal discharge green  but no headache. 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  09/06/12- 74 YOF former smoker, followed for asthma, allergic rhinitis, OSA/ failed CPAP, complicated by DM, GERD.  Mother here.  Has had flu shot. FOLLOWS ZGY:FVCBSW deep cough and wheezing; also states that [facility] came picked up CPAP machine due to Medicare and  stating patient not using it for 90 days. Daily wheeze using rescue inhaler or nebulizer to control.no major distress. We discussed ways to provide more stability. Diabetes and persistent thrush interfere with steroid choices. CXR 05/06/12 IMPRESSION:  No evidence of acute cardiopulmonary disease.  Original Report Authenticated By: [name], M.D.  12/01/2012 Acute OV  Complains of wheezing, chest tightness, dry cough, increased SOB - reports no improvement since last ov, worse this AM for 2 weeks .  Has been doing nebs 4x daily. No hemoptysis, n/v, orthopnea, edema, or chest pain.  No otc used. Used Tussionex with some help.  05/04/13  ER follow up  ER follow up , cough, wheezing for weeks with right sided pleuritic pain.  Patient went to the emergency room on October 7 for shortness, of breath and right-sided chest, and abdominal pain. Workup was unrevealing, with a negative. Chest x-ray, abdominal ultrasound and lab work Showed only minimally, elevated LFTs. Patient reports that she continues to have some pain. On her right rib area, especially, if she coughs. Patient describes a dry cough, and intermittent wheezing. She denies any discolored mucus or fever. Orthopnea, PND, leg swelling, or calf pain. Patient says she is currently being evaluated by neurology for her weakness, and muscle spasms.   Says cough is keeping her up at night .   05/31/13- 16 yoF46 YOF former smoker, followed for asthma, allergic rhinitis, OSA/ failed CPAP, complicated by DM, GERD.  Female friend here.   FOLLOWS FOR: has spasms in throat-getting choked up when eating or taking medications. Would like to try to get CPAP back-told that she is stopping breathing again. Had CPAP 11/ [facility]- taken away because of noncompliance Complains of "muscle spasms" in throat-chokes easily. This is episodic. She is seeing a neurologist at [facility] and is on Zanaflex. We discussed reflux precautions. Friend says she snores and stops  breathing. Sleeping in recliner because she says "hurt all over".  08/01/12- 89 yoF46 YOF former smoker, followed for asthma, allergic rhinitis, OSA/ failed CPAP, complicated by DM, GERD.  Mother here FOLLOWS FOR: had to repeat Sleep Study in December- She sleeps in a recliner to avoid back and leg pains. Muscle jerks bother her while sleeping. NPSG 07/06/13 WNL- AHI 4/ hr; PLMAs 2.6/ hr.  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  Mother here FOLLOWS FOR:can not tell a difference with just clonazepam; has had to use Zanaflex with clonazepam. Pt states she is waking up at least 2 times during the night having to use her rescue inhaler and neb tx. Also continues to use Dulera. Green sinus drainage with limited headache. Sleeps in a recliner. Brings DMV form.  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  Mother here FOLLOWS HQP:RFFMBWGYK doing okay overall; continues to use inhalers often. Will need to get neb meds through [place] (put order in to set up with them) "allergic to flu shot" Had upper respiratory infection with acute bronchitis  early November now resolved Daughter telling her again that she snores loudly. One sleep study was positive then the next one was negative. CPAP was taken away by DME  CT chest 05/31/14 IMPRESSION: No acute findings in the chest, abdomen, or pelvis. Specifically, no findings to explain the patient's history of right-sided body pain. Electronically Signed  By: Misty Stanley M.D.  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  Mother here FOLLOWS FOR: pt states she's had pleurisy since Feb 2016 with no improvement.  c/o sob, L sided  chest/abdominal discomfort, nonprod cough.  Had neg VQ and CXR, some disk disease on T-spine xray Chest pains described as occasionally mid sternum but usually along the left lower costal margin, increased by deep breath, twisting or walking. Relieved by ibuprofen. Not affected by meals. Chokes frequently. Some dry cough and wheeze. Sleep pattern is irregular. She tends to sleep. 20 minute intervals and snores. No longer using CPAP or oxygen. CXR 09/28/14 IMPRESSION: No active cardiopulmonary disease. Electronically Signed  By: Lahoma Crocker M.D.  On: 09/28/2014 16:35  06/06/15- 50 year old female former smoker, followed for asthma, allergic rhinitis, periodic limb movement in sleep,, located by DM, GERD, Fibromyalgia, aortic and coronary atherosclerosis., HBP Mother here O2 2 L sleep/[employer] FOLLOWS FOR: Pt states she uses O2 nightly but continues to have trouble breathing through the night and daytime now; pt wakes up during the night and hard to get deep breath. "Allergic to flu shot" Always feels a little shortness of breath, not characterized as to rest or exertion. No routine wheeze or cough, chest pain or palpitation. No acute event. Using rescue inhaler once or twice daily, Dulera daily, occasional nebulizer and occasional Dymista nasal spray Saw cardiology for known ASCVD and HBP CT chest 01/04/15 IMPRESSION: 1. No acute findings in the thorax to account for the patient's symptoms. 2. Atherosclerosis, including left main and left anterior descending coronary artery disease. Please note that although the presence of coronary artery calcium documents the presence of coronary artery disease, the severity of this disease and any potential stenosis cannot be assessed on this non-gated CT examination. Assessment for potential risk factor modification, dietary therapy or pharmacologic therapy may be warranted, if clinically indicated. Electronically Signed  By: Vinnie Langton M.D.  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 Pt states hard to swallow as well. Complains of sore jaw joints with chewing and admits history of "TMJ" Describes easy choking while eating or talking. Has had previous speech therapy supervised modified barium swallow in 2014 for same complaint and admits she needs reminder about teaching points she was given then. History of panic attacks and anxiety. Mother suggests today's concerns primarily reflect effort by her other physicians to reduce her pain medicines and chronic Valium, thus exposing symptoms. No particular change in her breathing status.  Review of Systems- see HPI Constitutional:   No-   weight loss, night sweats, fevers, chills,+irregular sleep schedule, +fatigue, lassitude. HEENT:   No-headaches, difficulty swallowing, tooth/dental problems, +sore throat,       No-  sneezing, itching, +ear ache, nasal congestion, post nasal drip,  CV:  +  chest pain, orthopnea, PND, swelling in lower extremities, anasarca,  dizziness, palpitations Resp: No- acute  shortness of breath with exertion or at rest.              No- productive cough,  + non-productive cough,  No- coughing up of blood.              No- change in color of mucus.   Skin: No-   rash or lesions. GI:  No-   heartburn, indigestion, abdominal pain, nausea, vomiting,  GU:  MS:  No-   joint pain or swelling.   Neuro-    +tremor Psych:  No- change in mood or affect. + depression or anxiety.  No memory loss.  Objective:   Physical Exam      OBJ- Physical Exam General- Alert, Oriented, + Somewhat anxious, Distress- none acute. + overweight Skin- rash-none, lesions- none, excoriation-  none Lymphadenopathy- none Head- atraumatic            Eyes- Gross vision intact, PERRLA, conjunctivae and secretions clear            Ears- Hearing, canals-normal            Nose-  turbinate edema-none, no-Septal dev, mucus, polyps, erosion, perforation             Throat- Mallampati II , mucosa clear , drainage- none, tonsils- atrophic, voice quality suggests mild voice strain Neck- flexible , trachea midline, no stridor , thyroid nl, carotid no bruit Chest - symmetrical excursion , unlabored           Heart/CV- RRR , no murmur , no gallop  , no rub, nl s1 s2                           - JVD- none , edema- none, stasis changes- none, varices- none           Lung- clear to P&A, wheeze- none, cough+light , dullness-none, rub- none           Chest wall-  Abd-  Br/ Gen/ Rectal- Not done, not indicated Extrem- cyanosis- none, clubbing, none, atrophy- none, strength- nl Neuro- +hesitant speech, +intention tremor

## 2015-10-24 NOTE — Assessment & Plan Note (Signed)
Previously identified mild dysphagia with a substantial anxiety overlay. She feels her problem is worse than before. Mother suggests that tapering off of pain medicines and anxiolytics is simply leaving patient more aware and worried about symptoms.  Plan-Rather than repeating modified barium swallow, I suggested they check with her GI doctor to see if any additional objective evaluation is advised. This is not primarily a lung or respiratory disorder.

## 2015-10-24 NOTE — Assessment & Plan Note (Signed)
Mild persistent uncomplicated well-controlled

## 2015-10-24 NOTE — Patient Instructions (Signed)
Order- referral to GI/ Dr Fuller Plan-  Anxiety, recurrent dysphagia

## 2015-10-25 DIAGNOSIS — M5414 Radiculopathy, thoracic region: Secondary | ICD-10-CM | POA: Diagnosis not present

## 2015-10-25 DIAGNOSIS — G894 Chronic pain syndrome: Secondary | ICD-10-CM | POA: Diagnosis not present

## 2015-11-01 DIAGNOSIS — G894 Chronic pain syndrome: Secondary | ICD-10-CM | POA: Diagnosis not present

## 2015-11-01 DIAGNOSIS — R350 Frequency of micturition: Secondary | ICD-10-CM | POA: Diagnosis not present

## 2015-11-01 DIAGNOSIS — E669 Obesity, unspecified: Secondary | ICD-10-CM | POA: Diagnosis not present

## 2015-11-01 DIAGNOSIS — M797 Fibromyalgia: Secondary | ICD-10-CM | POA: Diagnosis not present

## 2015-11-01 DIAGNOSIS — I10 Essential (primary) hypertension: Secondary | ICD-10-CM | POA: Diagnosis not present

## 2015-11-01 DIAGNOSIS — R7301 Impaired fasting glucose: Secondary | ICD-10-CM | POA: Diagnosis not present

## 2015-11-01 DIAGNOSIS — E1142 Type 2 diabetes mellitus with diabetic polyneuropathy: Secondary | ICD-10-CM | POA: Diagnosis not present

## 2015-11-01 DIAGNOSIS — K219 Gastro-esophageal reflux disease without esophagitis: Secondary | ICD-10-CM | POA: Diagnosis not present

## 2015-11-01 DIAGNOSIS — Z794 Long term (current) use of insulin: Secondary | ICD-10-CM | POA: Diagnosis not present

## 2015-11-01 DIAGNOSIS — D72829 Elevated white blood cell count, unspecified: Secondary | ICD-10-CM | POA: Diagnosis not present

## 2015-11-01 DIAGNOSIS — Z6835 Body mass index (BMI) 35.0-35.9, adult: Secondary | ICD-10-CM | POA: Diagnosis not present

## 2015-11-08 DIAGNOSIS — D72829 Elevated white blood cell count, unspecified: Secondary | ICD-10-CM | POA: Diagnosis not present

## 2015-11-13 ENCOUNTER — Other Ambulatory Visit: Payer: Self-pay

## 2015-11-13 NOTE — Patient Outreach (Signed)
[facility]) Care Management  [place]  11/13/2015   Cheryl [name] 07-24-1966 [id number]  Subjective: Telephone call to patient for monthly call. Patient reports she is doing ok.  Hanging in there.  She reports that she had her ketamine infusion from pain management and that she has done ok with it but still having some pain but rates lower at 6/10. Patient reports she was also taken off her xenaflex, valium, and pain medications. Patient states she has managed ok and to see pain management again on Friday.  Patient reports that she will be seeing a specialist for her high white count and a gastroenterologist for her problems with spasms in her throat.  She states that Dr. [name] seems to think she has some underlying problem that is causing her to be sick and just figuring out what that is.  Patient reports that her sugars have been up and down.  Patient unable to give numbers as she does not have her machine available. Discussed with patient the importance of maintaining her diet throughout her other health struggles.  She verbalized understanding.  Patient voices no concerns.   Objective:   Encounter Medications:  Outpatient Encounter Prescriptions as of 11/13/2015  Medication Sig Note  . ACCU-CHEK FASTCLIX LANCETS MISC  02/02/2015: Received from: External Pharmacy  . ACCU-CHEK SMARTVIEW test strip  02/02/2015: Received from: External Pharmacy  . albuterol (PROVENTIL HFA;VENTOLIN HFA) 108 (90 BASE) MCG/ACT inhaler Inhale 2 puffs into the lungs every 6 (six) hours as needed for wheezing or shortness of breath.   [name] atorvastatin (LIPITOR) 40 MG tablet Take 40 mg by mouth daily.   . Azelastine-Fluticasone (DYMISTA) 137-50 MCG/ACT SUSP Place 2 sprays into the nose daily.   . BD INSULIN SYRINGE ULTRAFINE 31G X 5/16" 0.3 ML MISC  02/02/2015: Received from: External Pharmacy  . diltiazem (CARDIZEM CD) 240 MG 24 hr capsule TAKE 1 CAPSULE BY MOUTH EVERY MORNING ON AN EMPTY STOMACH  - [name] DENIED, FAXED DR Johnsie Cancel 10/22/15 SS   . DULERA 100-5 MCG/ACT AERO INHALE 2 PUFFS INTO THE LUNGS TWICE DAILY   . DULoxetine (CYMBALTA) 60 MG capsule Take 60 mg by mouth 2 (two) times daily. 10/24/2015: Received from: [facility] Received Sig: Take 60 mg by mouth.  . insulin regular human CONCENTRATED (HUMULIN R) 500 UNIT/ML SOLN injection Inject 50-100 Units into the skin See admin instructions. 500 Unit/ML-  125 units (25 unit markings on U-100 insulin syringe) 30 mins before breakfast,  90 units (18 unit marking on syringe) before lunch and 20 units (4 unit markings syringe) before evening meal [place] TID  (skip if missing a meal). 05/01/2015: Patient reports taking pen- 125 units in the morning and 90 units in the evening.  [name] ipratropium-albuterol (DUONEB) 0.5-2.5 (3) MG/3ML SOLN Take 3 mLs by nebulization every 2 (two) hours as needed (shortness of breath).   . JARDIANCE 25 MG TABS tablet Take 1 tablet by mouth daily. 10/24/2015: Received from: External Pharmacy Received Sig:   . levothyroxine (SYNTHROID, LEVOTHROID) 100 MCG tablet Take 100 mcg by mouth daily before breakfast.   . nebivolol (BYSTOLIC) 10 MG tablet Take 1 tablet (10 mg total) by mouth daily.   [name] omeprazole (PRILOSEC) 40 MG capsule Take 40 mg by mouth daily.    [name] topiramate (TOPAMAX) 50 MG tablet Take 50 mg by mouth daily.   . Vitamin D, Ergocalciferol, (DRISDOL) 50000 UNITS CAPS Take 50,000 Units by mouth every 7 (seven) days.  Takes on Wed    No facility-administered encounter medications on file as of 11/13/2015.    Functional Status:  In your present state of health, do you have any difficulty performing the following activities: 08/06/2015 01/09/2015  Hearing? N N  Vision? Y -  Difficulty concentrating or making decisions? N N  Walking or climbing stairs? Y Y  Dressing or bathing? N N  Doing errands, shopping? [name]  Preparing Food and eating ? N N  Using the Toilet? N N  In the past six months,  have you accidently leaked urine? N N  Do you have problems with loss of bowel control? N N  Managing your Medications? Y Y  Managing your Finances? N N  Housekeeping or managing your Housekeeping? [name]    Fall/Depression Screening: [facility] 2/9 Scores 11/13/2015 09/21/2015 08/13/2015 08/06/2015 05/29/2015 05/01/2015 04/03/2015  PHQ - 2 Score 2 2 2 2 2 2 2   PHQ- 9 Score 7 7 7 7 7 7 7     Assessment: Patient continues to benefit from health coach outreach for disease management and support.   Plan:  [facility] CM Care Plan Problem One        Most Recent Value   Care Plan Problem One  knowledge deficit related to diabetes type 2   Role Documenting the Problem One  Health [place] for Problem One  Active   THN Long Term Goal (31-90 days)  Patient will maintain blood sugar average under 200 within 90 days.   THN Long Term Goal Start Date  10/19/15 [goal continued]   Interventions for Problem One Long Term Goal  RN Health Coach reinforced with patient diabetic diet and maintaining low carbohydrates.      THN CM Care Plan Problem Two        Most Recent Value   Care Plan Problem Two  Alternation in activity related to back, left side pain [From [facility] system]   Role Documenting the Problem Two  [place] for Problem Two  Active   Interventions for Problem Two Long Term Goal   RN Health Coach discussed with patient different pain medicaton options for pain management.  Patient receiving Ketamine infusion from pain management.     THN Long Term Goal (31-90) days  Pt will have adequately managed pain within 90 days   THN Long Term Goal Start Date  11/13/15 [facility] continued]     [place] will contact patient within one month and patient agrees to next outreach.  [name], RN, MSN [place] [phone]

## 2015-11-14 DIAGNOSIS — M797 Fibromyalgia: Secondary | ICD-10-CM | POA: Diagnosis not present

## 2015-11-14 DIAGNOSIS — G8929 Other chronic pain: Secondary | ICD-10-CM | POA: Diagnosis not present

## 2015-11-14 DIAGNOSIS — M546 Pain in thoracic spine: Secondary | ICD-10-CM | POA: Diagnosis not present

## 2015-11-14 DIAGNOSIS — G894 Chronic pain syndrome: Secondary | ICD-10-CM | POA: Diagnosis not present

## 2015-11-19 ENCOUNTER — Encounter: Payer: Self-pay | Admitting: Hematology

## 2015-11-19 ENCOUNTER — Telehealth: Payer: Self-pay | Admitting: Hematology and Oncology

## 2015-11-19 DIAGNOSIS — J4540 Moderate persistent asthma, uncomplicated: Secondary | ICD-10-CM | POA: Diagnosis not present

## 2015-11-19 NOTE — Telephone Encounter (Signed)
Spoke with patient re new patient hem appointment with Dr. Irene Limbo 5/17 @ 11 am to arrive 10:45 am. Patient demographic and insurance information confirmed. Called Dora at [facility] at [place] (referring) and left message requesting [employer] referral for 5/17 office visit.

## 2015-11-27 DIAGNOSIS — E1165 Type 2 diabetes mellitus with hyperglycemia: Secondary | ICD-10-CM | POA: Diagnosis not present

## 2015-11-27 DIAGNOSIS — Z794 Long term (current) use of insulin: Secondary | ICD-10-CM | POA: Diagnosis not present

## 2015-11-27 DIAGNOSIS — E1142 Type 2 diabetes mellitus with diabetic polyneuropathy: Secondary | ICD-10-CM | POA: Diagnosis not present

## 2015-11-27 DIAGNOSIS — E782 Mixed hyperlipidemia: Secondary | ICD-10-CM | POA: Diagnosis not present

## 2015-11-27 DIAGNOSIS — E039 Hypothyroidism, unspecified: Secondary | ICD-10-CM | POA: Diagnosis not present

## 2015-12-10 ENCOUNTER — Other Ambulatory Visit: Payer: Self-pay

## 2015-12-10 ENCOUNTER — Ambulatory Visit: Payer: Self-pay

## 2015-12-10 DIAGNOSIS — G8929 Other chronic pain: Secondary | ICD-10-CM | POA: Diagnosis not present

## 2015-12-10 DIAGNOSIS — M546 Pain in thoracic spine: Secondary | ICD-10-CM | POA: Diagnosis not present

## 2015-12-10 DIAGNOSIS — M5414 Radiculopathy, thoracic region: Secondary | ICD-10-CM | POA: Diagnosis not present

## 2015-12-10 DIAGNOSIS — G894 Chronic pain syndrome: Secondary | ICD-10-CM | POA: Diagnosis not present

## 2015-12-10 NOTE — Patient Outreach (Signed)
[facility]) Care Management  12/10/2015  Ambreen Tufte Nielsen [birth year] [id number]   Telephone call to patient for monthly outreach.  No answer.  HIPAA compliant voice message left.    Plan: RN Health Coach will attempt patient within 1-2 weeks.   [name], RN, MSN [place] [phone]

## 2015-12-12 ENCOUNTER — Encounter: Payer: Self-pay | Admitting: Hematology

## 2015-12-12 ENCOUNTER — Telehealth: Payer: Self-pay | Admitting: Hematology

## 2015-12-12 ENCOUNTER — Other Ambulatory Visit (HOSPITAL_COMMUNITY)
Admission: RE | Admit: 2015-12-12 | Discharge: 2015-12-12 | Disposition: A | Discharge status: Home / Self Care | Payer: Commercial Managed Care - HMO | Source: Ambulatory Visit | Attending: Hematology | Admitting: Hematology

## 2015-12-12 ENCOUNTER — Ambulatory Visit (HOSPITAL_BASED_OUTPATIENT_CLINIC_OR_DEPARTMENT_OTHER): Payer: Commercial Managed Care - HMO

## 2015-12-12 ENCOUNTER — Ambulatory Visit (HOSPITAL_BASED_OUTPATIENT_CLINIC_OR_DEPARTMENT_OTHER): Payer: Commercial Managed Care - HMO | Admitting: Hematology

## 2015-12-12 VITALS — BP 120/64 | HR 88 | Temp 98.5°F | Resp 18 | Ht 62.0 in | Wt 198.2 lb

## 2015-12-12 DIAGNOSIS — D72820 Lymphocytosis (symptomatic): Secondary | ICD-10-CM

## 2015-12-12 DIAGNOSIS — D71 Functional disorders of polymorphonuclear neutrophils: Secondary | ICD-10-CM | POA: Diagnosis not present

## 2015-12-12 DIAGNOSIS — D72829 Elevated white blood cell count, unspecified: Secondary | ICD-10-CM | POA: Insufficient documentation

## 2015-12-12 DIAGNOSIS — B370 Candidal stomatitis: Secondary | ICD-10-CM | POA: Insufficient documentation

## 2015-12-12 DIAGNOSIS — R110 Nausea: Secondary | ICD-10-CM | POA: Insufficient documentation

## 2015-12-12 DIAGNOSIS — R1012 Left upper quadrant pain: Secondary | ICD-10-CM

## 2015-12-12 DIAGNOSIS — M797 Fibromyalgia: Secondary | ICD-10-CM

## 2015-12-12 DIAGNOSIS — R1010 Upper abdominal pain, unspecified: Secondary | ICD-10-CM

## 2015-12-12 DIAGNOSIS — K760 Fatty (change of) liver, not elsewhere classified: Secondary | ICD-10-CM | POA: Diagnosis not present

## 2015-12-12 DIAGNOSIS — R109 Unspecified abdominal pain: Secondary | ICD-10-CM | POA: Insufficient documentation

## 2015-12-12 LAB — CBC & DIFF AND RETIC
BASO%: 0.3 % (ref 0.0–2.0)
Basophils Absolute: 0.1 10*3/uL (ref 0.0–0.1)
EOS%: 2.6 % (ref 0.0–7.0)
Eosinophils Absolute: 0.5 10*3/uL (ref 0.0–0.5)
HCT: 41.6 % (ref 34.8–46.6)
HGB: 14.3 g/dL (ref 11.6–15.9)
Immature Retic Fract: 8.2 % (ref 1.60–10.00)
LYMPH%: 33.6 % (ref 14.0–49.7)
MCH: 29.4 pg (ref 25.1–34.0)
MCHC: 34.4 g/dL (ref 31.5–36.0)
MCV: 85.6 fL (ref 79.5–101.0)
MONO#: 1.2 10*3/uL — ABNORMAL HIGH (ref 0.1–0.9)
MONO%: 6.7 % (ref 0.0–14.0)
NEUT#: 9.8 10*3/uL — ABNORMAL HIGH (ref 1.5–6.5)
NEUT%: 56.8 % (ref 38.4–76.8)
Platelets: 409 10*3/uL — ABNORMAL HIGH (ref 145–400)
RBC: 4.86 10*6/uL (ref 3.70–5.45)
RDW: 14 % (ref 11.2–14.5)
Retic %: 3.4 % — ABNORMAL HIGH (ref 0.70–2.10)
Retic Ct Abs: 165.24 10*3/uL — ABNORMAL HIGH (ref 33.70–90.70)
WBC: 17.3 10*3/uL — ABNORMAL HIGH (ref 3.9–10.3)
lymph#: 5.8 10*3/uL — ABNORMAL HIGH (ref 0.9–3.3)

## 2015-12-12 LAB — COMPREHENSIVE METABOLIC PANEL WITH GFR
ALT: 26 U/L (ref 0–55)
AST: 22 U/L (ref 5–34)
Albumin: 4.5 g/dL (ref 3.5–5.0)
Alkaline Phosphatase: 115 U/L (ref 40–150)
Anion Gap: 9 meq/L (ref 3–11)
BUN: 12.5 mg/dL (ref 7.0–26.0)
CO2: 26 meq/L (ref 22–29)
Calcium: 10.1 mg/dL (ref 8.4–10.4)
Chloride: 105 meq/L (ref 98–109)
Creatinine: 0.8 mg/dL (ref 0.6–1.1)
EGFR: 88 ml/min/1.73 m2 — ABNORMAL LOW
Glucose: 92 mg/dL (ref 70–140)
Potassium: 3.7 meq/L (ref 3.5–5.1)
Sodium: 140 meq/L (ref 136–145)
Total Bilirubin: 0.61 mg/dL (ref 0.20–1.20)
Total Protein: 8.7 g/dL — ABNORMAL HIGH (ref 6.4–8.3)

## 2015-12-12 LAB — TSH: TSH: 0.445 m(IU)/L (ref 0.308–3.960)

## 2015-12-12 LAB — TECHNOLOGIST REVIEW

## 2015-12-12 LAB — CHCC SMEAR

## 2015-12-12 MED ORDER — NYSTATIN 100000 UNIT/ML MT SUSP: 5.0000 mL | Freq: Four times a day (QID) | OROMUCOSAL | Status: DC

## 2015-12-12 MED ORDER — ONDANSETRON HCL 8 MG PO TABS
4.0000 mg | ORAL_TABLET | Freq: Once | ORAL | Status: AC
  Administered 2015-12-12: 8 mg via ORAL

## 2015-12-12 MED ORDER — ONDANSETRON HCL 8 MG PO TABS
ORAL_TABLET | ORAL | Status: AC
  Filled 2015-12-12: qty 1

## 2015-12-12 NOTE — Telephone Encounter (Signed)
per pof to sch pt appt-gave pt copy of avs-sent back ot lab

## 2015-12-13 LAB — SEDIMENTATION RATE: Sedimentation Rate-Westergren: 2 mm/hr (ref 0–32)

## 2015-12-14 ENCOUNTER — Ambulatory Visit (HOSPITAL_COMMUNITY): Payer: Commercial Managed Care - HMO

## 2015-12-14 ENCOUNTER — Encounter (HOSPITAL_COMMUNITY): Payer: Self-pay

## 2015-12-14 ENCOUNTER — Ambulatory Visit (HOSPITAL_COMMUNITY)
Admission: RE | Admit: 2015-12-14 | Discharge: 2015-12-14 | Disposition: A | Discharge status: Home / Self Care | Payer: Commercial Managed Care - HMO | Source: Ambulatory Visit | Attending: Hematology | Admitting: Hematology

## 2015-12-14 DIAGNOSIS — K5641 Fecal impaction: Secondary | ICD-10-CM | POA: Diagnosis not present

## 2015-12-14 DIAGNOSIS — R1012 Left upper quadrant pain: Secondary | ICD-10-CM | POA: Diagnosis not present

## 2015-12-14 LAB — FLOW CYTOMETRY

## 2015-12-16 NOTE — Progress Notes (Signed)
[name]    HEMATOLOGY/ONCOLOGY CONSULTATION NOTE  Date of Service: 12/16/2015  Patient Care Team: Seward Carol, MD as PCP - General (Internal Medicine) Jon Billings, RN as [place] Management  CHIEF COMPLAINTS/PURPOSE OF CONSULTATION:  Leukocytosis   HISTORY OF PRESENTING ILLNESS:   Cheryl [name] is a 50 y.o. female who has been referred to [name] by Dr [name] for evaluation and management of leukocytosis.  She has a complex medical history including history of chronic pain (follows with pain clinic) related to her severe fibromyalgia and lumbar degenerative disc disease and severe peripheral neuropathy, diabetes, gastroparesis, fatty liver, history of latent TB status post INH, depression, hypothyroidism, sleep apnea, and migraine headaches.  She notes that she has had leukocytosis for long-time and was previously seen by Dr. Jonette Eva last in 2007. She reports that she was told that her leukocytosis was related to parvovirus infection and she was treated with IVIG and also reports having had some chemotherapy that caused significant weakness and landed on her on disability. She reports that she has never had a bone marrow biopsy. She reports that she has had chronic splenomegaly and provide [name] an ultrasound results from 2003 that describes diffuse fatty liver and mild splenomegaly. She reports that she is getting ketamine injections that causes her to be nauseous and confused all week after her weekly dose.   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 She reported that she had left axillary lymph nodes removed which were negative.  She reports that she was not offered any adjuvant treatments.  Currently follows with dermatology Dr. Ubaldo Glassing for continued followup of her melanoma.  History of endometriosis wwas on hormone replacement therapy from her late 10s to May 2015.  She reports that with the abnormality on her right breast and a mammogram  and that she is due to get an ultrasound to further evaluate this.  She reports dysphagia slow speech and confusion and in February 2017 was noted to have a vocal cord dysfunction is going to be seen by [name] Plan from ENT.  Patient had recent labs with her primary care physician that showed WBC count of 17.6k with 11.9k nneutrophils and 4.4k lymphocytes.  She was also noted to have borderline thrombocytosis at 430k.  Normal hemoglobin of 14.6 with an MCV of 87.  Review of her outside available labs suggested that she has had leukocytosis for several years.  Available labs showed WBC count of 15k in December 2014 with a neutrophil count of 7.8k lymphocyte count of 4.9k.   Platelets have mostly been normal.  Hemoglobin has remained normal.  She reports no fevers or chills.  No night sweats.  No unexpected weight loss.  No clear new focal symptoms suggestive of infection.   MEDICAL HISTORY:  Past Medical History  Diagnosis Date  . GERD (gastroesophageal reflux disease)   . Chronic airway obstruction, not elsewhere classified   . Asthma   . Hyperlipidemia   . Hypothyroidism   . Depression   . Gastroparesis     from DM and chronic narcotic use  . DDD (degenerative disc disease)     CERVIAL AND LUMBAR  . Edema   . Human parvovirus infection   . 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 Fatty liver disease, nonalcoholic   . Positive PPD   . Vitamin D deficiency   . Vocal cord dysfunction   . PONV (  postoperative nausea and vomiting)   . Hypertension   . Shortness of breath     "at anytime; it's gotten worse recently" (02/09/2013)  . OSA (obstructive sleep apnea)     "have mask;; don't use it; no one came out to check it" (02/09/2013)  . Type II diabetes mellitus ([place])   . Fibromyalgia     "severe" (02/09/2013)  . Peripheral neuropathy ([place])     "severe" (02/09/2013)  . Migraine headache     "weekly; worse lately" (02/09/2013)  . Chronic  pain     "q where; herniated disc in my tailbone" (02/09/2013)  . Anxiety   . Interstitial cystitis   Left arm melanoma status post surgery at [place] in 2016.  She follows with Dr. Ubaldo Glassing from dermatology.  Reports negative left axillary sentinel lymph node biopsy.  History of endometriosis wwas on hormone replacement therapy from her late 47s to May 2015.  She reports that with the abnormality on her right breast and a mammogram and that she is due to get an ultrasound to further evaluate this.    SURGICAL HISTORY: Past Surgical History  Procedure Laterality Date  . Abdominal hysterectomy  1993  . Cholecystectomy  ?1990  . Nasal sinus surgery  1986; 1990's    "i've had 3" (02/09/2013)  . Knee arthroscopy Left 1990's    "fell; clot behind knee cap had to be removed" (02/09/2013)  . Hip surgery Right 2002    "did something to the tibula band" (02/09/2013)  . Shoulder arthroscopy w/ rotator cuff repair Right     "had to go deep in rotator cuff" (02/09/2013)  . Anterior cervical decomp/discectomy fusion  2004  . Carpal tunnel release Bilateral ?1980's    SOCIAL HISTORY: Social History   Social History  . Marital Status: Legally Separated    Spouse Name: N/A  . Number of Children: N/A  . Years of Education: N/A   Occupational History  . Not on file.   Social History Main Topics  . Smoking status: Former Smoker -- 0.10 packs/day for 3 years    Types: Cigarettes    Quit date: 07/28/2005  . Smokeless tobacco: Never Used  . Alcohol Use: No  . Drug Use: No  . Sexual Activity: No   Other Topics Concern  . Not on file   Social History Narrative    FAMILY HISTORY: Family History  Problem Relation Age of Onset  . Coronary artery disease Father     ALLERGIES:  is allergic to influenza vaccines; iodinated diagnostic agents; iohexol; levofloxacin; nucynta; pregabalin; sulfonamide derivatives; vilazodone hcl; amoxicillin; biaxin; carbamazepine; ciprofloxacin;  dilaudid; percocet; and tapentadol.  MEDICATIONS:  Current Outpatient Prescriptions  Medication Sig Dispense Refill  . cefdinir (OMNICEF) 300 MG capsule     . tiZANidine (ZANAFLEX) 4 MG tablet Take by mouth.    [name] ACCU-CHEK FASTCLIX LANCETS MISC     . ACCU-CHEK SMARTVIEW test strip     . albuterol (PROVENTIL HFA;VENTOLIN HFA) 108 (90 BASE) MCG/ACT inhaler Inhale 2 puffs into the lungs every 6 (six) hours as needed for wheezing or shortness of breath. 3 Inhaler 1  . atorvastatin (LIPITOR) 40 MG tablet Take 40 mg by mouth daily.    . Azelastine-Fluticasone (DYMISTA) 137-50 MCG/ACT SUSP Place 2 sprays into the nose daily. 3 Bottle 1  . BD INSULIN SYRINGE ULTRAFINE 31G X 5/16" 0.3 ML MISC     . diltiazem (CARDIZEM CD) 240 MG 24 hr capsule TAKE 1 CAPSULE BY MOUTH  EVERY MORNING ON AN EMPTY STOMACH - [name] DENIED, FAXED DR Johnsie Cancel 10/22/15 SS 30 capsule 5  . DULERA 100-5 MCG/ACT AERO INHALE 2 PUFFS INTO THE LUNGS TWICE DAILY 13 g 5  . DULoxetine (CYMBALTA) 60 MG capsule Take 60 mg by mouth 2 (two) times daily.    [name] gabapentin (NEURONTIN) 800 MG tablet Take by mouth.    . insulin regular human CONCENTRATED (HUMULIN R) 500 UNIT/ML SOLN injection Inject 50-100 Units into the skin See admin instructions. 500 Unit/ML-  150 units (25 unit markings on U-100 insulin syringe) 30 mins before breakfast,  90 units (18 unit marking on syringe) before lunch and 20 units (4 unit markings syringe) before evening meal [place] TID  (skip if missing a meal).    [name] ipratropium-albuterol (DUONEB) 0.5-2.5 (3) MG/3ML SOLN Take 3 mLs by nebulization every 2 (two) hours as needed (shortness of breath). 1080 mL 1  . JARDIANCE 25 MG TABS tablet Take 1 tablet by mouth daily.    [name] levothyroxine (SYNTHROID, LEVOTHROID) 100 MCG tablet Take 100 mcg by mouth daily before breakfast.    . nebivolol (BYSTOLIC) 10 MG tablet Take 1 tablet (10 mg total) by mouth daily. 30 tablet 11  . nystatin (MYCOSTATIN) 100000 UNIT/ML suspension Take 5 mLs  (500,000 Units total) by mouth 4 (four) times daily. 240 mL 0  . omeprazole (PRILOSEC) 40 MG capsule Take 40 mg by mouth daily.     . promethazine (PHENERGAN) 25 MG tablet Take by mouth.    . topiramate (TOPAMAX) 50 MG tablet Take 50 mg by mouth daily.    . Vitamin D, Ergocalciferol, (DRISDOL) 50000 UNITS CAPS Take 50,000 Units by mouth every 7 (seven) days. Takes on Wed     No current facility-administered medications for this visit.    REVIEW OF SYSTEMS:    10 Point review of Systems was done is negative except as noted above.  PHYSICAL EXAMINATION: ECOG PERFORMANCE STATUS: 2 - Symptomatic, <50% confined to bed  . Filed Vitals:   12/12/15 1052  BP: 120/64  Pulse: 88  Temp: 98.5 F (36.9 C)  Resp: 18   Filed Weights   12/12/15 1052  Weight: 198 lb 3.2 oz (89.903 kg)   .Body mass index is 36.24 kg/(m^2).  GENERAL:alert, anxious appearing Caucasian female. SKIN: no acute rashes EYES: normal, conjunctiva are pink and non-injected, sclera clear OROPHARYNX:no exudate, no erythema and lips, buccal mucosa, and tongue normal  NECK: supple, no JVD, thyroid normal size, non-tender, without nodularity LYMPH:  no palpable lymphadenopathy in the cervical, axillary or inguinal LUNGS: clear to auscultation with normal respiratory effort HEART: regular rate & rhythm,  no murmurs and no lower extremity edema ABDOMEN: abdomen soft, ,minimal tenderness to palpation over her left upper quadrant.  No guarding no rigidity or rebound. normoactive bowel sounds ,no palpable hepatosplenomegaly. Musculoskeletal: no cyanosis of digits and no clubbing  PSYCH: alert & oriented x 3 with fluent speech NEURO: no focal motor/sensory deficits  LABORATORY DATA:  I have reviewed the data as listed  . CBC Latest Ref Rng 12/12/2015 05/18/2015 02/02/2015  WBC 3.9 - 10.3 10e3/uL 17.3(H) 16.5(H) -  Hemoglobin 11.6 - 15.9 g/dL 14.3 13.4 14.3  Hematocrit 34.8 - 46.6 % 41.6 38.8 42.2  Platelets 145 - 400  10e3/uL 409(H) 401(H) -    . CMP Latest Ref Rng 12/12/2015 05/18/2015 09/28/2014  Glucose 70 - 140 mg/dl 92 76 58(L)  BUN 7.0 - 26.0 mg/dL 12.5 12 <5(L)  Creatinine 0.6 -  1.1 mg/dL 0.8 0.54 0.67  Sodium 136 - 145 mEq/L 140 137 140  Potassium 3.5 - 5.1 mEq/L 3.7 3.4(L) 3.7  Chloride 101 - 111 mmol/L - 104 101  CO2 22 - 29 mEq/L 26 23 31   Calcium 8.4 - 10.4 mg/dL 10.1 9.8 9.8  Total Protein 6.4 - 8.3 g/dL 8.7(H) 8.1 -  Total Bilirubin 0.20 - 1.20 mg/dL 0.61 0.6 -  Alkaline Phos 40 - 150 U/L 115 86 -  AST 5 - 34 U/L 22 46(H) -  ALT 0 - 55 U/L 26 47 -   Component     Latest Ref Rng 12/12/2015  Sed Rate     0 - 32 mm/hr 2  TSH     0.308 - 3.960 m(IU)/L 0.445     RADIOGRAPHIC STUDIES: I have personally reviewed the radiological images as listed and agreed with the findings in the report. Ct Abdomen Pelvis Wo Contrast  12/14/2015  CLINICAL DATA:  Left upper quadrant pain for 2 years. Prior cholecystectomy and hysterectomy. EXAM: CT ABDOMEN AND PELVIS WITHOUT CONTRAST TECHNIQUE: Multidetector CT imaging of the abdomen and pelvis was performed following the standard protocol without IV contrast. COMPARISON:  05/31/2014 FINDINGS: Lower chest:  No acute findings. Hepatobiliary: No mass visualized on this un-enhanced exam. Prior cholecystectomy noted. No evidence of biliary dilatation. Pancreas: No mass or inflammatory process identified on this un-enhanced exam. Spleen: Within normal limits in size. Spleen measures 10 cm in length Adrenals/Urinary Tract: No evidence of urolithiasis or hydronephrosis. Unopacified urinary bladder is unremarkable in appearance. Stomach/Bowel: No evidence of obstruction, inflammatory process, or abnormal fluid collections. Moderate amount of colonic stool noted, without significant change compared to prior study. Vascular/Lymphatic: No pathologically enlarged lymph nodes. No evidence of abdominal aortic aneurysm. Reproductive: No mass or other significant abnormality.  Other: None. Musculoskeletal:  No suspicious bone lesions identified. IMPRESSION: No acute findings within the abdomen or pelvis. Moderate stool burden again noted; suggest clinical correlation for possible constipation. Electronically Signed   By: Earle Gell M.D.   On: 12/14/2015 13:22    ASSESSMENT & PLAN:   50 year old Caucasian female with multiple medical comorbidities including significant chronic pain issues related to her severe fibromyalgia, severe peripheral neuropathy and degenerative disc disease in her spine undergoing complex pain management in pain management clinic.  #1 Chronic idiopathic neutrophilia of unclear etiology. He appears that she might have had this for >76yr.  The chronicity of these findings along with the absence of splenomegaly makes the presence of a myeloproliferative neoplasm less likely.  Also she does not have significant polycythemia or thrombocytosis. She has multiple emotional and physical stressors that could play a role. She was in ex-smoker has quite long-time back but sometimes the neutrophilia can persist. Visit duration of her neutrophilia makes it less likely to be related to underlying malignancy. ?medications ?obesity related No overt evidence of systemic inflammation as noted by a normal sedimentation rate.  #2 Mild lymphocytosis- Flow cytometry shows no abnormal B or T cell population.no peripheral palpable significant lymphadenopathy.  No intra-abdominal adenopathy on CT.  #3 Upper abdominal discomfort CT chest abdomen and pelvis shows no evidence of splenomegaly or significant liver pathology.   Noted to have constipation which is likely the cause of her pain.  This is likely due to multiple medication that she is on and limited ambulation. Plan  -Normal acute indication for bone marrow biopsy at this time. -Patient appears to have stable chronic neutrophilia in the absence of overt infection. -physical  activity to try to achieve close to  ideal body weight. -will get BCR-ABL clonal marker to r/o CML -if increasing leucocytosis might consider a broad MPN panel +/- bone marrow biopsy. -stress reduction techniques - continue followup with primary care physician  Return to care with Dr. Irene Limbo in 2 months with repeat CBC, CMP.  . Orders Placed This Encounter  Procedures  . CT Abdomen Pelvis Wo Contrast    Standing Status: Future     Number of Occurrences: 1     Standing Expiration Date: 12/11/2016    Order Specific Question:  Reason for Exam (SYMPTOM  OR DIAGNOSIS REQUIRED)    Answer:  upper abdominal discomfort  of unclear etiology, evaluate for liver pathology and spleen size.  no IV dye since patient has iodine allergy.    Order Specific Question:  Is the patient pregnant?    Answer:  No    Order Specific Question:  Preferred imaging location?    Answer:  [facility]  . CBC & Diff and Retic    Standing Status: Future     Number of Occurrences: 1     Standing Expiration Date: 01/15/2017  . Comprehensive metabolic panel    Standing Status: Future     Number of Occurrences: 1     Standing Expiration Date: 12/11/2016  . Smear    Standing Status: Future     Number of Occurrences: 1     Standing Expiration Date: 12/11/2016  . Sedimentation rate    Standing Status: Future     Number of Occurrences: 1     Standing Expiration Date: 12/11/2016  . bcr/abl    Standing Status: Future     Number of Occurrences: 1     Standing Expiration Date: 12/11/2016  . Flow Cytometry    lymphocytosis    Standing Status: Future     Number of Occurrences: 1     Standing Expiration Date: 12/11/2016  . TSH    Standing Status: Future     Number of Occurrences: 1     Standing Expiration Date: 12/11/2016  . CBC & Diff and Retic    Standing Status: Future     Number of Occurrences:      Standing Expiration Date: 01/15/2017  . Comprehensive metabolic panel    Standing Status: Future     Number of Occurrences:      Standing  Expiration Date: 12/11/2016    All of the patients questions were answered with apparent satisfaction. The patient knows to call the clinic with any problems, questions or concerns.  I spent 60 minutes counseling the patient face to face. The total time spent in the appointment was 60 minutes and more than 50% was on counseling and direct patient cares.    Sullivan Lone MD [place] AAHIVMS [facility] Hematology/Oncology Physician [facility]  (Office):       [phone] (Work cell):  [phone] (Fax):           [phone]

## 2015-12-17 ENCOUNTER — Other Ambulatory Visit: Payer: Self-pay | Admitting: Internal Medicine

## 2015-12-17 ENCOUNTER — Other Ambulatory Visit: Payer: Self-pay

## 2015-12-17 DIAGNOSIS — N6310 Unspecified lump in the right breast, unspecified quadrant: Secondary | ICD-10-CM

## 2015-12-17 NOTE — Patient Outreach (Signed)
[facility]) Care Management  [place]  12/17/2015   Cheryl [name] January 29, 1966 [id number]  Subjective: Telephone call to patient for monthly call. Patient reports she is hanging in there. She reports recent visit to hematologist due to increased white count.  She reports several test done and waiting for results. Patient reports that her ketamine injections are helping some with pain but still has pain to under left breast. She reports having an abdominal and pelvis CT scan to explore.  Patient reports today's blood sugar was 165.  Averages are some better.  Patient reports she is no longer taking the Jardiance due to thrush and yeast infections but still taking Humulin R.  Discussed with patient managing blood sugars and pain.  She verbalized understanding.     Objective:   Encounter Medications:  Outpatient Encounter Prescriptions as of 12/17/2015  Medication Sig Note  . ACCU-CHEK FASTCLIX LANCETS MISC  02/02/2015: Received from: External Pharmacy  . ACCU-CHEK SMARTVIEW test strip  02/02/2015: Received from: External Pharmacy  . albuterol (PROVENTIL HFA;VENTOLIN HFA) 108 (90 BASE) MCG/ACT inhaler Inhale 2 puffs into the lungs every 6 (six) hours as needed for wheezing or shortness of breath.   [name] atorvastatin (LIPITOR) 40 MG tablet Take 40 mg by mouth daily.   . Azelastine-Fluticasone (DYMISTA) 137-50 MCG/ACT SUSP Place 2 sprays into the nose daily.   . BD INSULIN SYRINGE ULTRAFINE 31G X 5/16" 0.3 ML MISC  02/02/2015: Received from: External Pharmacy  . cefdinir (OMNICEF) 300 MG capsule  12/12/2015: Received from: [facility]  . diltiazem (CARDIZEM CD) 240 MG 24 hr capsule TAKE 1 CAPSULE BY MOUTH EVERY MORNING ON AN EMPTY STOMACH - [name] DENIED, FAXED DR Johnsie Cancel 10/22/15 SS   . DULERA 100-5 MCG/ACT AERO INHALE 2 PUFFS INTO THE LUNGS TWICE DAILY   . DULoxetine (CYMBALTA) 60 MG capsule Take 60 mg by mouth 2 (two) times daily. 10/24/2015: Received from: [facility] Received Sig: Take 60 mg by mouth.  . gabapentin (NEURONTIN) 800 MG tablet Take by mouth. 12/12/2015: Received from: [place]  . insulin regular human CONCENTRATED (HUMULIN R) 500 UNIT/ML SOLN injection Inject 50-100 Units into the skin See admin instructions. 500 Unit/ML-  150 units (25 unit markings on U-100 insulin syringe) 30 mins before breakfast,  90 units (18 unit marking on syringe) before lunch and 20 units (4 unit markings syringe) before evening meal [place] TID  (skip if missing a meal). 12/17/2015: Patient reports ordered 145 units in the AM then 100 units in the evening.  Patient states she is only taking AM dose.   [name] ipratropium-albuterol (DUONEB) 0.5-2.5 (3) MG/3ML SOLN Take 3 mLs by nebulization every 2 (two) hours as needed (shortness of breath).   [name] levothyroxine (SYNTHROID, LEVOTHROID) 100 MCG tablet Take 100 mcg by mouth daily before breakfast.   . nebivolol (BYSTOLIC) 10 MG tablet Take 1 tablet (10 mg total) by mouth daily.   [name] nystatin (MYCOSTATIN) 100000 UNIT/ML suspension Take 5 mLs (500,000 Units total) by mouth 4 (four) times daily.   [name] omeprazole (PRILOSEC) 40 MG capsule Take 40 mg by mouth daily.    . promethazine (PHENERGAN) 25 MG tablet Take by mouth. 12/12/2015: Received from: [place]  . tiZANidine (ZANAFLEX) 4 MG tablet Take by mouth. 12/12/2015: Received from: [place]  . topiramate (TOPAMAX) 50 MG tablet Take 50 mg by mouth daily.   . Vitamin D, Ergocalciferol, (DRISDOL) 50000 UNITS CAPS Take 50,000 Units by mouth  every 7 (seven) days. Takes on Wed   . JARDIANCE 25 MG TABS tablet Take 1 tablet by mouth daily. Reported on 12/17/2015 12/17/2015: Patient reports discontinued   No facility-administered encounter medications on file as of 12/17/2015.    Functional Status:  In your present state of health, do you have any difficulty performing the following activities: 08/06/2015 01/09/2015  Hearing? N N  Vision? Y -  Difficulty concentrating  or making decisions? N N  Walking or climbing stairs? Y Y  Dressing or bathing? N N  Doing errands, shopping? [name]  Preparing Food and eating ? N N  Using the Toilet? N N  In the past six months, have you accidently leaked urine? N N  Do you have problems with loss of bowel control? N N  Managing your Medications? Y Y  Managing your Finances? N N  Housekeeping or managing your Housekeeping? [name]    Fall/Depression Screening: [facility] 2/9 Scores 11/13/2015 09/21/2015 08/13/2015 08/06/2015 05/29/2015 05/01/2015 04/03/2015  PHQ - 2 Score 2 2 2 2 2 2 2   PHQ- 9 Score 7 7 7 7 7 7 7     Assessment: Patient continues to benefit from health coach outreach for disease management and support.    Plan:  [facility] CM Care Plan Problem One        Most Recent Value   Care Plan Problem One  knowledge deficit related to diabetes type 2   Role Documenting the Problem One  Health [place] for Problem One  Active   THN Long Term Goal (31-90 days)  Patient will maintain blood sugar average under 200 within 90 days.   THN Long Term Goal Start Date  12/17/15 [goal continued]   Interventions for Problem One Long Term Goal  RN Health Coach reviewed with patient diabetic diet and maintaining low carbohydrates.      THN CM Care Plan Problem Two        Most Recent Value   Care Plan Problem Two  Alternation in activity related to back, left side pain [From [facility] system]   Role Documenting the Problem Two  [place] for Problem Two  Active   Interventions for Problem Two Long Term Goal   RN Health Coach reviewed with patient different pain medicaton options for pain management.  Patient receiving Ketamine infusion from pain management.  Patient reports some improvement.      THN Long Term Goal (31-90) days  Pt will have adequately managed pain within 90 days   THN Long Term Goal Start Date  12/17/15 [facility] continued]     [place] will contact patient within one month and patient agrees to next  outreach.    [name], RN, MSN [place] [phone]

## 2015-12-18 ENCOUNTER — Telehealth: Payer: Self-pay

## 2015-12-18 ENCOUNTER — Ambulatory Visit: Payer: Commercial Managed Care - HMO | Admitting: Gastroenterology

## 2015-12-18 NOTE — Telephone Encounter (Signed)
-----   Message from Dow Adolph sent at 12/18/2015  8:07 AM EDT ----- Pt canceled and will CB to resch'd bc she is sick

## 2015-12-18 NOTE — Telephone Encounter (Signed)
Do you want to charge?

## 2015-12-18 NOTE — Telephone Encounter (Signed)
Yes charge

## 2015-12-19 DIAGNOSIS — J4540 Moderate persistent asthma, uncomplicated: Secondary | ICD-10-CM | POA: Diagnosis not present

## 2015-12-27 DIAGNOSIS — I788 Other diseases of capillaries: Secondary | ICD-10-CM | POA: Diagnosis not present

## 2015-12-27 DIAGNOSIS — L821 Other seborrheic keratosis: Secondary | ICD-10-CM | POA: Diagnosis not present

## 2015-12-27 DIAGNOSIS — D1801 Hemangioma of skin and subcutaneous tissue: Secondary | ICD-10-CM | POA: Diagnosis not present

## 2015-12-27 DIAGNOSIS — L986 Other infiltrative disorders of the skin and subcutaneous tissue: Secondary | ICD-10-CM | POA: Diagnosis not present

## 2015-12-27 DIAGNOSIS — D485 Neoplasm of uncertain behavior of skin: Secondary | ICD-10-CM | POA: Diagnosis not present

## 2015-12-27 DIAGNOSIS — Z85820 Personal history of malignant melanoma of skin: Secondary | ICD-10-CM | POA: Diagnosis not present

## 2015-12-27 DIAGNOSIS — D2272 Melanocytic nevi of left lower limb, including hip: Secondary | ICD-10-CM | POA: Diagnosis not present

## 2015-12-27 DIAGNOSIS — D225 Melanocytic nevi of trunk: Secondary | ICD-10-CM | POA: Diagnosis not present

## 2015-12-27 DIAGNOSIS — D2271 Melanocytic nevi of right lower limb, including hip: Secondary | ICD-10-CM | POA: Diagnosis not present

## 2016-01-02 ENCOUNTER — Telehealth: Payer: Self-pay | Admitting: Internal Medicine

## 2016-01-02 DIAGNOSIS — J4530 Mild persistent asthma, uncomplicated: Secondary | ICD-10-CM

## 2016-01-02 NOTE — Telephone Encounter (Signed)
Spoke with the pt  She states that she is not able to afford her nocturnal o2 any longer She states that with all of her other expenses that she has, it's just becoming too much CDY, please advise, thanks!

## 2016-01-03 DIAGNOSIS — E669 Obesity, unspecified: Secondary | ICD-10-CM | POA: Diagnosis not present

## 2016-01-03 DIAGNOSIS — D72829 Elevated white blood cell count, unspecified: Secondary | ICD-10-CM | POA: Diagnosis not present

## 2016-01-03 DIAGNOSIS — G894 Chronic pain syndrome: Secondary | ICD-10-CM | POA: Diagnosis not present

## 2016-01-03 DIAGNOSIS — Z6836 Body mass index (BMI) 36.0-36.9, adult: Secondary | ICD-10-CM | POA: Diagnosis not present

## 2016-01-03 DIAGNOSIS — K219 Gastro-esophageal reflux disease without esophagitis: Secondary | ICD-10-CM | POA: Diagnosis not present

## 2016-01-03 DIAGNOSIS — M797 Fibromyalgia: Secondary | ICD-10-CM | POA: Diagnosis not present

## 2016-01-03 DIAGNOSIS — I10 Essential (primary) hypertension: Secondary | ICD-10-CM | POA: Diagnosis not present

## 2016-01-03 NOTE — Telephone Encounter (Signed)
Called, spoke with pt.  Disucssed below.  Order placed. Pt aware and voiced no further questions or concerns at this time.

## 2016-01-03 NOTE — Telephone Encounter (Signed)
DC home O2

## 2016-01-09 ENCOUNTER — Other Ambulatory Visit: Payer: Self-pay

## 2016-01-09 NOTE — Patient Outreach (Signed)
[facility]) Care Management  01/09/2016  Cheryl [name] 03-02-1966 [id number]   Telephone call to patient for monthly call. No answer HIPAA compliant voice message left.    Plan: RN Health Coach will attempt patient within 2 weeks.    [name], RN, MSN [place] [phone]

## 2016-01-10 ENCOUNTER — Ambulatory Visit: Payer: Commercial Managed Care - HMO | Admitting: Internal Medicine

## 2016-01-15 ENCOUNTER — Other Ambulatory Visit: Payer: Self-pay

## 2016-01-15 DIAGNOSIS — M5030 Other cervical disc degeneration, unspecified cervical region: Secondary | ICD-10-CM | POA: Diagnosis not present

## 2016-01-15 DIAGNOSIS — M47812 Spondylosis without myelopathy or radiculopathy, cervical region: Secondary | ICD-10-CM | POA: Diagnosis not present

## 2016-01-15 DIAGNOSIS — M542 Cervicalgia: Secondary | ICD-10-CM | POA: Diagnosis not present

## 2016-01-15 NOTE — Patient Outreach (Signed)
[facility]) Care Management  [place]  01/15/2016   Cheryl [name] 05-30-1966 [id number]  Subjective: Return call to patient for monthly call. Patient report she is ok but just continued pain.  Patient reports that Dr. [name] feels she needs a detox of medication and he really does not like her being on the ketamine infusion. Patient reports she is to see neurosurgeon today and will discuss pain management with him.  Patient also mentioned that she maybe tested for chronic myeloid leukemia by the oncologist but appointment is not until 02-11-16.  Patient reports that her sugars have been all over the place and this morning her sugar was in the 300's. We discussed diet and making good food choices despite her wanting other things. She verbalized understanding.    Objective:   Encounter Medications:  Outpatient Encounter Prescriptions as of 01/15/2016  Medication Sig Note  . ACCU-CHEK FASTCLIX LANCETS MISC  02/02/2015: Received from: External Pharmacy  . ACCU-CHEK SMARTVIEW test strip  02/02/2015: Received from: External Pharmacy  . albuterol (PROVENTIL HFA;VENTOLIN HFA) 108 (90 BASE) MCG/ACT inhaler Inhale 2 puffs into the lungs every 6 (six) hours as needed for wheezing or shortness of breath.   [name] atorvastatin (LIPITOR) 40 MG tablet Take 40 mg by mouth daily.   . Azelastine-Fluticasone (DYMISTA) 137-50 MCG/ACT SUSP Place 2 sprays into the nose daily.   . BD INSULIN SYRINGE ULTRAFINE 31G X 5/16" 0.3 ML MISC  02/02/2015: Received from: External Pharmacy  . cefdinir (OMNICEF) 300 MG capsule  12/12/2015: Received from: [facility]  . diltiazem (CARDIZEM CD) 240 MG 24 hr capsule TAKE 1 CAPSULE BY MOUTH EVERY MORNING ON AN EMPTY STOMACH - [name] DENIED, FAXED DR Johnsie Cancel 10/22/15 SS   . DULERA 100-5 MCG/ACT AERO INHALE 2 PUFFS INTO THE LUNGS TWICE DAILY   . DULoxetine (CYMBALTA) 60 MG capsule Take 60 mg by mouth 2 (two) times daily. 10/24/2015: Received from: [facility] Received Sig: Take 60 mg by mouth.  . gabapentin (NEURONTIN) 800 MG tablet Take by mouth. 12/12/2015: Received from: [place]  . insulin regular human CONCENTRATED (HUMULIN R) 500 UNIT/ML SOLN injection Inject 50-100 Units into the skin See admin instructions. 500 Unit/ML-  150 units (25 unit markings on U-100 insulin syringe) 30 mins before breakfast,  90 units (18 unit marking on syringe) before lunch and 20 units (4 unit markings syringe) before evening meal [place] TID  (skip if missing a meal). 12/17/2015: Patient reports ordered 145 units in the AM then 100 units in the evening.  Patient states she is only taking AM dose.   [name] ipratropium-albuterol (DUONEB) 0.5-2.5 (3) MG/3ML SOLN Take 3 mLs by nebulization every 2 (two) hours as needed (shortness of breath).   [name] levothyroxine (SYNTHROID, LEVOTHROID) 100 MCG tablet Take 100 mcg by mouth daily before breakfast.   . nebivolol (BYSTOLIC) 10 MG tablet Take 1 tablet (10 mg total) by mouth daily.   [name] nystatin (MYCOSTATIN) 100000 UNIT/ML suspension Take 5 mLs (500,000 Units total) by mouth 4 (four) times daily.   [name] omeprazole (PRILOSEC) 40 MG capsule Take 40 mg by mouth daily.    . promethazine (PHENERGAN) 25 MG tablet Take by mouth. 12/12/2015: Received from: [place]  . tiZANidine (ZANAFLEX) 4 MG tablet Take by mouth. 12/12/2015: Received from: [place]  . topiramate (TOPAMAX) 50 MG tablet Take 50 mg by mouth daily.   . Vitamin D, Ergocalciferol, (DRISDOL) 50000 UNITS CAPS Take 50,000 Units by  mouth every 7 (seven) days. Takes on Wed   . JARDIANCE 25 MG TABS tablet Take 1 tablet by mouth daily. Reported on 01/15/2016 12/17/2015: Patient reports discontinued   No facility-administered encounter medications on file as of 01/15/2016.    Functional Status:  In your present state of health, do you have any difficulty performing the following activities: 08/06/2015  Hearing? N  Vision? Y  Difficulty concentrating or making  decisions? N  Walking or climbing stairs? Y  Dressing or bathing? N  Doing errands, shopping? Y  Preparing Food and eating ? N  Using the Toilet? N  In the past six months, have you accidently leaked urine? N  Do you have problems with loss of bowel control? N  Managing your Medications? Y  Managing your Finances? N  Housekeeping or managing your Housekeeping? Y    Fall/Depression Screening: PHQ 2/9 Scores 11/13/2015 09/21/2015 08/13/2015 08/06/2015 05/29/2015 05/01/2015 04/03/2015  PHQ - 2 Score 2 2 2 2 2 2 2   PHQ- 9 Score 7 7 7 7 7 7 7     Assessment:  Patient continues to benefit from health coach outreach for disease management and support.       Plan:  [facility] CM Care Plan Problem One        Most Recent Value   Care Plan Problem One  knowledge deficit related to diabetes type 2   Role Documenting the Problem One  Health [place] for Problem One  Active   THN Long Term Goal (31-90 days)  Patient will maintain blood sugar average under 200 within 90 days.   THN Long Term Goal Start Date  01/15/16 [goal continued]   Interventions for Problem One Long Term Goal  RN Health Coach reiterated with patient diabetic diet and maintaining low carbohydrates.      THN CM Care Plan Problem Two        Most Recent Value   Care Plan Problem Two  Alternation in activity related to back, left side pain   Role Documenting the Problem Two  [place] for Problem Two  Active   Interventions for Problem Two Long Term Goal   RN Health Coach reiterated with patient different pain medicaton options for pain management.  Patient receiving Ketamine infusion from pain management.     THN Long Term Goal (31-90) days  Pt will have adequately managed pain within 90 days   THN Long Term Goal Start Date  01/15/16 [facility] continued]     [place] will contact patient within one month and patient agrees to next outreach.  [name], RN, MSN [place] [phone]

## 2016-01-16 ENCOUNTER — Ambulatory Visit: Payer: Self-pay

## 2016-01-23 ENCOUNTER — Ambulatory Visit
Admission: RE | Admit: 2016-01-23 | Discharge: 2016-01-23 | Disposition: A | Discharge status: Home / Self Care | Payer: Commercial Managed Care - HMO | Source: Ambulatory Visit | Attending: Internal Medicine | Admitting: Internal Medicine

## 2016-01-23 DIAGNOSIS — N6310 Unspecified lump in the right breast, unspecified quadrant: Secondary | ICD-10-CM

## 2016-01-23 DIAGNOSIS — N63 Unspecified lump in breast: Secondary | ICD-10-CM | POA: Diagnosis not present

## 2016-02-11 ENCOUNTER — Other Ambulatory Visit (HOSPITAL_BASED_OUTPATIENT_CLINIC_OR_DEPARTMENT_OTHER): Payer: Commercial Managed Care - HMO

## 2016-02-11 ENCOUNTER — Encounter: Payer: Self-pay | Admitting: Hematology

## 2016-02-11 ENCOUNTER — Ambulatory Visit (HOSPITAL_BASED_OUTPATIENT_CLINIC_OR_DEPARTMENT_OTHER): Payer: Commercial Managed Care - HMO | Admitting: Hematology

## 2016-02-11 VITALS — BP 125/50 | HR 107 | Temp 98.0°F | Resp 18 | Ht 62.0 in | Wt 205.1 lb

## 2016-02-11 DIAGNOSIS — Z85820 Personal history of malignant melanoma of skin: Secondary | ICD-10-CM

## 2016-02-11 DIAGNOSIS — D72829 Elevated white blood cell count, unspecified: Secondary | ICD-10-CM

## 2016-02-11 DIAGNOSIS — D72820 Lymphocytosis (symptomatic): Secondary | ICD-10-CM | POA: Diagnosis not present

## 2016-02-11 DIAGNOSIS — D720 Genetic anomalies of leukocytes: Secondary | ICD-10-CM

## 2016-02-11 DIAGNOSIS — N63 Unspecified lump in unspecified breast: Secondary | ICD-10-CM | POA: Insufficient documentation

## 2016-02-11 DIAGNOSIS — R1010 Upper abdominal pain, unspecified: Secondary | ICD-10-CM

## 2016-02-11 DIAGNOSIS — N62 Hypertrophy of breast: Secondary | ICD-10-CM | POA: Diagnosis not present

## 2016-02-11 DIAGNOSIS — Z9889 Other specified postprocedural states: Secondary | ICD-10-CM

## 2016-02-11 LAB — CBC & DIFF AND RETIC
BASO%: 0.2 % (ref 0.0–2.0)
Basophils Absolute: 0 10*3/uL (ref 0.0–0.1)
EOS%: 4.2 % (ref 0.0–7.0)
Eosinophils Absolute: 0.6 10*3/uL — ABNORMAL HIGH (ref 0.0–0.5)
HCT: 38.4 % (ref 34.8–46.6)
HGB: 13.4 g/dL (ref 11.6–15.9)
Immature Retic Fract: 8.7 % (ref 1.60–10.00)
LYMPH%: 31.3 % (ref 14.0–49.7)
MCH: 29.3 pg (ref 25.1–34.0)
MCHC: 34.9 g/dL (ref 31.5–36.0)
MCV: 84 fL (ref 79.5–101.0)
MONO#: 0.7 10*3/uL (ref 0.1–0.9)
MONO%: 5.6 % (ref 0.0–14.0)
NEUT#: 7.6 10*3/uL — ABNORMAL HIGH (ref 1.5–6.5)
NEUT%: 58.7 % (ref 38.4–76.8)
Platelets: 320 10*3/uL (ref 145–400)
RBC: 4.57 10*6/uL (ref 3.70–5.45)
RDW: 14 % (ref 11.2–14.5)
Retic %: 3.89 % — ABNORMAL HIGH (ref 0.70–2.10)
Retic Ct Abs: 177.77 10*3/uL — ABNORMAL HIGH (ref 33.70–90.70)
WBC: 13 10*3/uL — ABNORMAL HIGH (ref 3.9–10.3)
lymph#: 4.1 10*3/uL — ABNORMAL HIGH (ref 0.9–3.3)

## 2016-02-11 LAB — COMPREHENSIVE METABOLIC PANEL
ALT: 32 U/L (ref 0–55)
AST: 20 U/L (ref 5–34)
Albumin: 4.1 g/dL (ref 3.5–5.0)
Alkaline Phosphatase: 116 U/L (ref 40–150)
Anion Gap: 11 mEq/L (ref 3–11)
BUN: 8.2 mg/dL (ref 7.0–26.0)
CO2: 23 mEq/L (ref 22–29)
Calcium: 9.7 mg/dL (ref 8.4–10.4)
Chloride: 104 mEq/L (ref 98–109)
Creatinine: 0.8 mg/dL (ref 0.6–1.1)
EGFR: 81 mL/min/{1.73_m2} — ABNORMAL LOW (ref >90)
Glucose: 280 mg/dl — ABNORMAL HIGH (ref 70–140)
Potassium: 3.7 mEq/L (ref 3.5–5.1)
Sodium: 138 mEq/L (ref 136–145)
Total Bilirubin: 0.59 mg/dL (ref 0.20–1.20)
Total Protein: 8 g/dL (ref 6.4–8.3)

## 2016-02-11 NOTE — Progress Notes (Signed)
[name]    HEMATOLOGY/ONCOLOGY CONSULTATION NOTE  Date of Service: 02/11/2016  Patient Care Team: Seward Carol, MD as PCP - General (Internal Medicine) Jon Billings, RN as [place] Management  CHIEF COMPLAINTS/PURPOSE OF CONSULTATION:  Leukocytosis   HISTORY OF PRESENTING ILLNESS:   Cheryl [name] is a 50 y.o. female who has been referred to [name] by Dr [name] for evaluation and management of leukocytosis.  She has a complex medical history including history of chronic pain (follows with pain clinic) related to her severe fibromyalgia and lumbar degenerative disc disease and severe peripheral neuropathy, diabetes, gastroparesis, fatty liver, history of latent TB status post INH, depression, hypothyroidism, sleep apnea, and migraine headaches.  She notes that she has had leukocytosis for long-time and was previously seen by Dr. Jonette Eva last in 2007. She reports that she was told that her leukocytosis was related to parvovirus infection and she was treated with IVIG and also reports having had some chemotherapy that caused significant weakness and landed on her on disability. She reports that she has never had a bone marrow biopsy. She reports that she has had chronic splenomegaly and provide [name] an ultrasound results from 2003 that describes diffuse fatty liver and mild splenomegaly. She reports that she is getting ketamine injections that causes her to be nauseous and confused all week after her weekly dose.   She reports Having had surgery for left arm melanoma last year at [facility]. She reported that she had left axillary lymph nodes removed which were negative.  She reports that she was not offered any adjuvant treatments.  Currently follows with dermatology Dr. Ubaldo Glassing for continued followup of her melanoma.  History of endometriosis wwas on hormone replacement therapy from her late 35s to May 2015.  She reports that with the abnormality on her right breast and a mammogram  and that she is due to get an ultrasound to further evaluate this.  She reports dysphagia slow speech and confusion and in February 2017 was noted to have a vocal cord dysfunction is going to be seen by [name] Plan from ENT.  Patient had recent labs with her primary care physician that showed WBC count of 17.6k with 11.9k nneutrophils and 4.4k lymphocytes.  She was also noted to have borderline thrombocytosis at 430k.  Normal hemoglobin of 14.6 with an MCV of 87.  Review of her outside available labs suggested that she has had leukocytosis for several years.  Available labs showed WBC count of 15k in December 2014 with a neutrophil count of 7.8k lymphocyte count of 4.9k.   Platelets have mostly been normal.  Hemoglobin has remained normal.   INTERVAL HISTORY  Patient is here to discuss the results of her workup for leukocytosis. She notes no other acute new symptoms. She reports no fevers or chills.  No night sweats.  No unexpected weight loss.  No clear new focal symptoms suggestive of infection.  WBC counts are down to 13k from 17.3k 2 months ago. Weight has been stable. She continues to have chronic pain issues. No other acute new symptoms. Hemoglobin and platelets remain stable.  MEDICAL HISTORY:  Past Medical History  Diagnosis Date  . GERD (gastroesophageal reflux disease)   . Chronic airway obstruction, not elsewhere classified   . Asthma   . Hyperlipidemia   . Hypothyroidism   . Depression   . Gastroparesis     from DM and chronic narcotic use  . DDD (degenerative disc disease)     CERVIAL  AND LUMBAR  . Edema   . Human parvovirus infection   . 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 Fatty liver disease, nonalcoholic   . Positive PPD   . Vitamin D deficiency   . Vocal cord dysfunction   . PONV (postoperative nausea and vomiting)   . Hypertension   . Shortness of breath     "at anytime; it's gotten worse recently" (02/09/2013)    . OSA (obstructive sleep apnea)     "have mask;; don't use it; no one came out to check it" (02/09/2013)  . Type II diabetes mellitus ([place])   . Fibromyalgia     "severe" (02/09/2013)  . Peripheral neuropathy ([place])     "severe" (02/09/2013)  . Migraine headache     "weekly; worse lately" (02/09/2013)  . Chronic pain     "q where; herniated disc in my tailbone" (02/09/2013)  . Anxiety   . Interstitial cystitis   Left arm melanoma status post surgery at [place] in 2016.  She follows with Dr. Ubaldo Glassing from dermatology.  Reports negative left axillary sentinel lymph node biopsy.  History of endometriosis was on hormone replacement therapy from her late 30s to May 2015.  She reports that with the abnormality on her right breast and a mammogram and that she is due to get an ultrasound to further evaluate this.    SURGICAL HISTORY: Past Surgical History  Procedure Laterality Date  . Abdominal hysterectomy  1993  . Cholecystectomy  ?1990  . Nasal sinus surgery  1986; 1990's    "i've had 3" (02/09/2013)  . Knee arthroscopy Left 1990's    "fell; clot behind knee cap had to be removed" (02/09/2013)  . Hip surgery Right 2002    "did something to the tibula band" (02/09/2013)  . Shoulder arthroscopy w/ rotator cuff repair Right     "had to go deep in rotator cuff" (02/09/2013)  . Anterior cervical decomp/discectomy fusion  2004  . Carpal tunnel release Bilateral ?1980's    SOCIAL HISTORY: Social History   Social History  . Marital Status: Legally Separated    Spouse Name: N/A  . Number of Children: N/A  . Years of Education: N/A   Occupational History  . Not on file.   Social History Main Topics  . Smoking status: Former Smoker -- 0.10 packs/day for 3 years    Types: Cigarettes    Quit date: 07/28/2005  . Smokeless tobacco: Never Used  . Alcohol Use: No  . Drug Use: No  . Sexual Activity: No   Other Topics Concern  . Not on file   Social History Narrative    FAMILY  HISTORY: Family History  Problem Relation Age of Onset  . Coronary artery disease Father     ALLERGIES:  is allergic to influenza vaccines; iodinated diagnostic agents; iohexol; levofloxacin; nucynta; pregabalin; sulfonamide derivatives; vilazodone hcl; amoxicillin; biaxin; carbamazepine; ciprofloxacin; dilaudid; percocet; and tapentadol.  MEDICATIONS:  Current Outpatient Prescriptions  Medication Sig Dispense Refill  . ACCU-CHEK FASTCLIX LANCETS MISC     . ACCU-CHEK SMARTVIEW test strip     . albuterol (PROVENTIL HFA;VENTOLIN HFA) 108 (90 BASE) MCG/ACT inhaler Inhale 2 puffs into the lungs every 6 (six) hours as needed for wheezing or shortness of breath. 3 Inhaler 1  . atorvastatin (LIPITOR) 40 MG tablet Take 40 mg by mouth daily.    . Azelastine-Fluticasone (DYMISTA) 137-50 MCG/ACT SUSP Place 2 sprays into the nose  daily. 3 Bottle 1  . BD INSULIN SYRINGE ULTRAFINE 31G X 5/16" 0.3 ML MISC     . diltiazem (CARDIZEM CD) 240 MG 24 hr capsule TAKE 1 CAPSULE BY MOUTH EVERY MORNING ON AN EMPTY STOMACH - [name] DENIED, FAXED DR Johnsie Cancel 10/22/15 SS 30 capsule 5  . DULERA 100-5 MCG/ACT AERO INHALE 2 PUFFS INTO THE LUNGS TWICE DAILY 13 g 5  . DULoxetine (CYMBALTA) 60 MG capsule Take 60 mg by mouth 2 (two) times daily.    . insulin regular human CONCENTRATED (HUMULIN R) 500 UNIT/ML SOLN injection Inject 50-100 Units into the skin See admin instructions. 500 Unit/ML-  150 units (25 unit markings on U-100 insulin syringe) 30 mins before breakfast,  90 units (18 unit marking on syringe) before lunch and 20 units (4 unit markings syringe) before evening meal [place] TID  (skip if missing a meal).    [name] ipratropium-albuterol (DUONEB) 0.5-2.5 (3) MG/3ML SOLN Take 3 mLs by nebulization every 2 (two) hours as needed (shortness of breath). 1080 mL 1  . levothyroxine (SYNTHROID, LEVOTHROID) 100 MCG tablet Take 100 mcg by mouth daily before breakfast.    . nebivolol (BYSTOLIC) 10 MG tablet Take 1 tablet (10 mg total)  by mouth daily. 30 tablet 11  . nystatin (MYCOSTATIN) 100000 UNIT/ML suspension Take 5 mLs (500,000 Units total) by mouth 4 (four) times daily. 240 mL 0  . omeprazole (PRILOSEC) 40 MG capsule Take 40 mg by mouth daily.     . promethazine (PHENERGAN) 25 MG tablet Take by mouth.    [name] tiZANidine (ZANAFLEX) 4 MG tablet Take by mouth.    . topiramate (TOPAMAX) 50 MG tablet Take 50 mg by mouth daily.    . Vitamin D, Ergocalciferol, (DRISDOL) 50000 UNITS CAPS Take 50,000 Units by mouth every 7 (seven) days. Takes on Wed     No current facility-administered medications for this visit.    REVIEW OF SYSTEMS:    10 Point review of Systems was done is negative except as noted above.  PHYSICAL EXAMINATION: ECOG PERFORMANCE STATUS: 2 - Symptomatic, <50% confined to bed  . Filed Vitals:   02/11/16 0832  BP: 125/50  Pulse: 107  Temp: 98 F (36.7 C)  Resp: 18   Filed Weights   02/11/16 0832  Weight: 205 lb 1.6 oz (93.033 kg)   .Body mass index is 37.5 kg/(m^2).  GENERAL:alert, anxious appearing Caucasian female. SKIN: no acute rashes EYES: normal, conjunctiva are pink and non-injected, sclera clear OROPHARYNX:no exudate, no erythema and lips, buccal mucosa, and tongue normal  NECK: supple, no JVD, thyroid normal size, non-tender, without nodularity LYMPH:  no palpable lymphadenopathy in the cervical, axillary or inguinal LUNGS: clear to auscultation with normal respiratory effort HEART: regular rate & rhythm,  no murmurs and no lower extremity edema ABDOMEN: abdomen soft, ,minimal tenderness to palpation over her left upper quadrant.  No guarding no rigidity or rebound. normoactive bowel sounds ,no palpable hepatosplenomegaly. Musculoskeletal: no cyanosis of digits and no clubbing  PSYCH: alert & oriented x 3 with fluent speech NEURO: no focal motor/sensory deficits  LABORATORY DATA:  I have reviewed the data as listed  . CBC Latest Ref Rng 02/11/2016 12/12/2015 05/18/2015  WBC 3.9 -  10.3 10e3/uL 13.0(H) 17.3(H) 16.5(H)  Hemoglobin 11.6 - 15.9 g/dL 13.4 14.3 13.4  Hematocrit 34.8 - 46.6 % 38.4 41.6 38.8  Platelets 145 - 400 10e3/uL 320 409(H) 401(H)    . CMP Latest Ref Rng 02/11/2016 12/12/2015 05/18/2015  Glucose 70 -  140 mg/dl 280(H) 92 76  BUN 7.0 - 26.0 mg/dL 8.2 12.5 12  Creatinine 0.6 - 1.1 mg/dL 0.8 0.8 0.54  Sodium 136 - 145 mEq/L 138 140 137  Potassium 3.5 - 5.1 mEq/L 3.7 3.7 3.4(L)  Chloride 101 - 111 mmol/L - - 104  CO2 22 - 29 mEq/L _0 Calcium 8.4 - 10.4 mg/dL 9.7 10.1 9.8  Total Protein 6.4 - 8.3 g/dL 8.0 8.7(H) 8.1  Total Bilirubin 0.20 - 1.20 mg/dL 0.59 0.61 0.6  Alkaline Phos 40 - 150 U/L 116 115 86  AST 5 - 34 U/L 20 22 46(H)  ALT 0 - 55 U/L 32 26 47   Component     Latest Ref Rng 12/12/2015  Sed Rate     0 - 32 mm/hr 2  TSH     0.308 - 3.960 m(IU)/L 0.445     RADIOGRAPHIC STUDIES: I have personally reviewed the radiological images as listed and agreed with the findings in the report. US Breast Ltd Uni Right Inc Axilla  01/23/2016  CLINICAL DATA:  Right breast 9 o'clock benign-appearing nodules six-month follow-up. EXAM: 2D DIGITAL DIAGNOSTIC RIGHT MAMMOGRAM WITH ADJUNCT TOMO ULTRASOUND RIGHT BREAST COMPARISON:  Previous exam(s). ACR Breast Density Category b: There are scattered areas of fibroglandular density. FINDINGS: There is a persistent low-density potentially fat containing elliptical nodule in the right slightly lower outer quadrant, middle depth. On physical exam, no suspicious masses are found. Targeted ultrasound is performed, showing 5 mm benign-appearing nodule in the right 9 o'clock breast, 9 cm from the nipple. This finding is felt to correspond to the mammographically seen nodule. IMPRESSION: Benign-appearing right breast 9 o'clock nodule, for which six-month follow-up is recommended. RECOMMENDATION: Diagnostic mammogram and possibly ultrasound of the right breast in 6 months. (Code:FI-R-55M) I have discussed the findings  and recommendations with the patient. Results were also provided in writing at the conclusion of the visit. If applicable, a reminder letter will be sent to the patient regarding the next appointment. BI-RADS CATEGORY  3: Probably benign. Electronically Signed   By: Fidela Salisbury M.D.   On: 01/23/2016 10:39   Mm Diag Breast Tomo Uni Right  01/23/2016  CLINICAL DATA:  Right breast 9 o'clock benign-appearing nodules six-month follow-up. EXAM: 2D DIGITAL DIAGNOSTIC RIGHT MAMMOGRAM WITH ADJUNCT TOMO ULTRASOUND RIGHT BREAST COMPARISON:  Previous exam(s). ACR Breast Density Category b: There are scattered areas of fibroglandular density. FINDINGS: There is a persistent low-density potentially fat containing elliptical nodule in the right slightly lower outer quadrant, middle depth. On physical exam, no suspicious masses are found. Targeted ultrasound is performed, showing 5 mm benign-appearing nodule in the right 9 o'clock breast, 9 cm from the nipple. This finding is felt to correspond to the mammographically seen nodule. IMPRESSION: Benign-appearing right breast 9 o'clock nodule, for which six-month follow-up is recommended. RECOMMENDATION: Diagnostic mammogram and possibly ultrasound of the right breast in 6 months. (Code:FI-R-55M) I have discussed the findings and recommendations with the patient. Results were also provided in writing at the conclusion of the visit. If applicable, a reminder letter will be sent to the patient regarding the next appointment. BI-RADS CATEGORY  3: Probably benign. Electronically Signed   By: Fidela Salisbury M.D.   On: 01/23/2016 10:39    ASSESSMENT & PLAN:   50 year old Caucasian female with multiple medical comorbidities including significant chronic pain issues related to her severe fibromyalgia, severe peripheral neuropathy and degenerative disc disease in her spine undergoing complex pain management in pain  management clinic.  #1 Chronic idiopathic neutrophilia of  unclear etiology. He appears that she might have had this for >67yr.  The chronicity of these findings along with the absence of splenomegaly makes the presence of a myeloproliferative neoplasm less likely.  Also she does not have significant polycythemia or thrombocytosis. She has multiple emotional and physical stressors that could play a role. She was in ex-smoker has quite long-time back but sometimes the neutrophilia can persist. Visit duration of her neutrophilia makes it less likely to be related to underlying malignancy. ?medications ?obesity related ? Related to chronic pain No overt evidence of systemic inflammation as noted by a normal sedimentation rate.  #2 Mild lymphocytosis- Flow cytometry shows no abnormal B or T cell population.no peripheral palpable significant lymphadenopathy.  No intra-abdominal adenopathy on CT.  #3 Upper abdominal discomfort CT chest abdomen and pelvis shows no evidence of splenomegaly or significant liver pathology.   Noted to have constipation which is likely the cause of her pain.  This is likely due to multiple medication that she is on and limited ambulation. Plan -We discussed the results of her workup in details. No evidence of a myeloproliferative neoplasm at this time.  Spontaneously improve WBC count reassuring. -No acute indication for bone marrow biopsy at this time. -Continue follow-up with primary care physician. If the patient has significantly worsening leukocytosis more than 20,000 or other cytopenias/CBC abnormalities please reconsult [place] -physical activity to try to achieve close to ideal body weight. -if increasing leucocytosis might consider a broader MPN panel +/- bone marrow biopsy. -stress reduction techniques - pain management  - continue followup with primary care physician  #4  abnormal mammogram 01/23/2016 benign-appearing right breast 9:00 nodule  -Repeat mammogram and ultrasound of the right breast with primary care  physician in 6 months .  #5 history of melanoma  -Continue follow-up with primary care physician  -[facility]  Return to care with Dr. KIrene Limboas needed if any other acute new questions or concerns arise . No orders of the defined types were placed in this encounter.    All of the patients questions were answered with apparent satisfaction. The patient knows to call the clinic with any problems, questions or concerns.  I spent 25 minutes counseling the patient face to face. The total time spent in the appointment was 25 minutes and more than 50% was on counseling and direct patient cares.    GSullivan LoneMD MDelmarAAHIVMS SHilo [facility] Surgery CenterCHospital For Special SurgeryHematology/Oncology Physician [facility] (Office):       [phone](Work cell):  [phone](Fax):           [phone]

## 2016-02-12 ENCOUNTER — Other Ambulatory Visit: Payer: Self-pay

## 2016-02-12 NOTE — Patient Outreach (Signed)
[facility]) Care Management  [place]  02/12/2016   Cheryl [name] [birth year] [id number]  Subjective: Telephone call to patient for monthly call.  Patient reports she has been having bad nerve pain all over. She reports that Dr. [name] gave her Zanaflex back and that helps some. Patient saw the hematologist and he thought things with her white count were ok but recommended she see a neurologist. Patient is not receiving ketamine infusions any longer and is not seeing pain management. Patient reports her blood sugars are all over the place and this mornings blood sugar was 400.  Discussed with patient diet control and order to keep blood sugars under control. She verbalized understanding.   Objective:   Encounter Medications:  Outpatient Encounter Prescriptions as of 02/12/2016  Medication Sig Note  . ACCU-CHEK FASTCLIX LANCETS MISC  02/02/2015: Received from: External Pharmacy  . ACCU-CHEK SMARTVIEW test strip  02/02/2015: Received from: External Pharmacy  . albuterol (PROVENTIL HFA;VENTOLIN HFA) 108 (90 BASE) MCG/ACT inhaler Inhale 2 puffs into the lungs every 6 (six) hours as needed for wheezing or shortness of breath.   [name] atorvastatin (LIPITOR) 40 MG tablet Take 40 mg by mouth daily.   . Azelastine-Fluticasone (DYMISTA) 137-50 MCG/ACT SUSP Place 2 sprays into the nose daily.   . BD INSULIN SYRINGE ULTRAFINE 31G X 5/16" 0.3 ML MISC  02/02/2015: Received from: External Pharmacy  . diltiazem (CARDIZEM CD) 240 MG 24 hr capsule TAKE 1 CAPSULE BY MOUTH EVERY MORNING ON AN EMPTY STOMACH - [name] DENIED, FAXED DR Johnsie Cancel 10/22/15 SS   . DULERA 100-5 MCG/ACT AERO INHALE 2 PUFFS INTO THE LUNGS TWICE DAILY   . insulin regular human CONCENTRATED (HUMULIN R) 500 UNIT/ML SOLN injection Inject 50-100 Units into the skin See admin instructions. 500 Unit/ML-  150 units (25 unit markings on U-100 insulin syringe) 30 mins before breakfast,  90 units (18 unit marking on syringe) before  lunch and 20 units (4 unit markings syringe) before evening meal [place] TID  (skip if missing a meal). 12/17/2015: Patient reports ordered 145 units in the AM then 100 units in the evening.  Patient states she is only taking AM dose.   [name] ipratropium-albuterol (DUONEB) 0.5-2.5 (3) MG/3ML SOLN Take 3 mLs by nebulization every 2 (two) hours as needed (shortness of breath).   [name] levothyroxine (SYNTHROID, LEVOTHROID) 100 MCG tablet Take 100 mcg by mouth daily before breakfast.   . nebivolol (BYSTOLIC) 10 MG tablet Take 1 tablet (10 mg total) by mouth daily.   [name] nystatin (MYCOSTATIN) 100000 UNIT/ML suspension Take 5 mLs (500,000 Units total) by mouth 4 (four) times daily.   [name] omeprazole (PRILOSEC) 40 MG capsule Take 40 mg by mouth daily.    . promethazine (PHENERGAN) 25 MG tablet Take by mouth. 12/12/2015: Received from: [place]  . tiZANidine (ZANAFLEX) 4 MG tablet Take by mouth. 02/12/2016: Patient reports taking TID  . Vitamin D, Ergocalciferol, (DRISDOL) 50000 UNITS CAPS Take 50,000 Units by mouth every 7 (seven) days. Takes on Wed   . DULoxetine (CYMBALTA) 60 MG capsule Take 60 mg by mouth 2 (two) times daily. Reported on 02/12/2016 10/24/2015: Received from: [facility] Received Sig: Take 60 mg by mouth.  . topiramate (TOPAMAX) 50 MG tablet Take 50 mg by mouth daily. Reported on 02/12/2016    No facility-administered encounter medications on file as of 02/12/2016.    Functional Status:  In your present state of health, do you have any difficulty  performing the following activities: 08/06/2015  Hearing? N  Vision? Y  Difficulty concentrating or making decisions? N  Walking or climbing stairs? Y  Dressing or bathing? N  Doing errands, shopping? Y  Preparing Food and eating ? N  Using the Toilet? N  In the past six months, have you accidently leaked urine? N  Do you have problems with loss of bowel control? N  Managing your Medications? Y  Managing your Finances? N   Housekeeping or managing your Housekeeping? Y    Fall/Depression Screening: PHQ 2/9 Scores 02/12/2016 11/13/2015 09/21/2015 08/13/2015 08/06/2015 05/29/2015 05/01/2015  PHQ - 2 Score 2 2 2 2 2 2 2   PHQ- 9 Score 7 7 7 7 7 7 7     Assessment: Patient continues to benefit from health coach outreach for disease management and support.    Plan:  [facility] CM Care Plan Problem One        Most Recent Value   Care Plan Problem One  knowledge deficit related to diabetes type 2   Role Documenting the Problem One  Health [place] for Problem One  Active   [facility] Long Term Goal (31-90 days)  Patient will maintain blood sugar average under 200 within 90 days.   [facility] Long Term Goal Start Date  01/15/16 [goal continued]   Interventions for Problem One Long Term Goal  RN Health Coach reinforced with patient diabetic diet and maintaining low carbohydrates.      [facility] CM Care Plan Problem Two        Most Recent Value   Care Plan Problem Two  Alternation in activity related to back, left side pain   Role Documenting the Problem Two  [place] for Problem Two  Active   Interventions for Problem Two Long Term Goal   RN Health Coach reinforced with patient different pain medicaton options for pain management.  Patient taking Zanaflex and has stopped ketamine infusions. Patient to get neurology referral    [facility] Long Term Goal (31-90) days  Pt will have adequately managed pain within 90 days   [facility] Long Term Goal Start Date  02/12/16 [facility] continued]     [place] will contact patient in the month of August and patient agrees to next outreach.  [name], RN, MSN [place] [phone]

## 2016-02-19 ENCOUNTER — Encounter: Payer: Self-pay | Admitting: Gastroenterology

## 2016-02-19 ENCOUNTER — Ambulatory Visit (INDEPENDENT_AMBULATORY_CARE_PROVIDER_SITE_OTHER): Payer: Commercial Managed Care - HMO | Admitting: Gastroenterology

## 2016-02-19 VITALS — BP 108/60 | HR 76 | Ht 62.0 in | Wt 208.0 lb

## 2016-02-19 DIAGNOSIS — K3184 Gastroparesis: Secondary | ICD-10-CM | POA: Diagnosis not present

## 2016-02-19 DIAGNOSIS — K5900 Constipation, unspecified: Secondary | ICD-10-CM | POA: Diagnosis not present

## 2016-02-19 DIAGNOSIS — K219 Gastro-esophageal reflux disease without esophagitis: Secondary | ICD-10-CM | POA: Diagnosis not present

## 2016-02-19 NOTE — Patient Instructions (Signed)
Take Miralax mixing 17 grams in 8 ox of water 1-2 x daily for constipation.   You can purchase over the counter probiotics to take daily such as Align, [employer] or Restora.   Normal BMI (Body Mass Index- based on height and weight) is between 19 and 25. Your BMI today is Body mass index is 38.04 kg/m. [name] Please consider follow up  regarding your BMI with your Primary Care Provider.  Thank you for choosing me and [facility].  Pricilla Riffle. Dagoberto Ligas., MD., Marval Regal

## 2016-02-19 NOTE — Progress Notes (Signed)
    History of Present Illness: This is a 50 year old female with GERD and gastroparesis complaining of hoarseness, dysphagia, throat tightness and constipaiton. She also notes pain with chewing and pain speaking since February. She is accompanied by her mother. She feels her throat tightness is related to anxiety. She occasionally notes some mild difficulty swallowing. Barium esophagram and MBSS obtained in 2014 for same complaints was unremarkable. Her reflux symptoms are generally well controlled on omeprazole 40 mg twice daily. She manages gastroparesis with diet alone. She notes several days of constipation followed by a 1-2 days of loose stool. She has hard stools. Denies weight loss, abdominal pain, change in stool caliber, melena, hematochezia, nausea, vomiting, chest pain.   Current Medications, Allergies, Past Medical History, Past Surgical History, Family History and Social History were reviewed in [employer] record.  Physical Exam: General: Well developed, well nourished, no acute distress Head: Normocephalic and atraumatic Eyes:  sclerae anicteric, EOMI Ears: Normal auditory acuity Mouth: No deformity or lesions Lungs: Clear throughout to auscultation Heart: Regular rate and rhythm; no murmurs, rubs or bruits Abdomen: Soft, non tender and non distended. No masses, hepatosplenomegaly or hernias noted. Normal Bowel sounds Musculoskeletal: Symmetrical with no gross deformities  Pulses:  Normal pulses noted Extremities: No clubbing, cyanosis, edema or deformities noted Neurological: Alert oriented x 4, grossly nonfocal Psychological:  Alert and cooperative. Anxious.   Assessment and Recommendations:  1. GERD and gastroparesis. Her intermittent throat tightness and occasional difficulty swallowing could be related to GERD or anxiety. She was offered a barium esophagram to further evaluate her complaints but she declines feeling that her symptoms are currently  mild. She is advised to contact us if her symptoms worsen. Continue standard antireflux measures and follow standard gastroparesis diet. Continue omeprazole 40 mg twice daily.  2. Constipation alternating with diarrhea. Begin MiraLAX once or twice daily titrated for adequate daily bowel movements.  3. Pain with chewing, pain speaking and hoarseness. Follow-up with PCP. May require ENT referral.

## 2016-03-03 DIAGNOSIS — E1165 Type 2 diabetes mellitus with hyperglycemia: Secondary | ICD-10-CM | POA: Diagnosis not present

## 2016-03-03 DIAGNOSIS — E1142 Type 2 diabetes mellitus with diabetic polyneuropathy: Secondary | ICD-10-CM | POA: Diagnosis not present

## 2016-03-03 DIAGNOSIS — E782 Mixed hyperlipidemia: Secondary | ICD-10-CM | POA: Diagnosis not present

## 2016-03-03 DIAGNOSIS — E039 Hypothyroidism, unspecified: Secondary | ICD-10-CM | POA: Diagnosis not present

## 2016-03-03 DIAGNOSIS — Z794 Long term (current) use of insulin: Secondary | ICD-10-CM | POA: Diagnosis not present

## 2016-03-05 DIAGNOSIS — G629 Polyneuropathy, unspecified: Secondary | ICD-10-CM | POA: Diagnosis not present

## 2016-03-10 ENCOUNTER — Other Ambulatory Visit: Payer: Self-pay

## 2016-03-10 NOTE — Patient Outreach (Signed)
[facility]) Care Management  [place]  03/10/2016   Cheryl [name] [birth year] [id number]  Subjective: Telephone call to patient for monthly call. Patient reports she is about the same. She does reports some problems with forgetfulness.  Patient reports she is to see a new neurologist next month.  She reports seeing Dr. [name] last week and neurology referral was done then.  Patient reports she is to go back to the pain clinic in [place] as recommended by Dr. [name].  Patient reports pain clinic appointment is next month as well.  Patient reports her pain continues and is more like numbness and tingling.  Discussed affects of diabetic neuropathy. She verbalized understanding. Patient reports that her sugars are all over the place and that her sugar this morning was 181 and her last A1c was 8.0. Discussed with patient importance of maintaining diet of low carbohydrates and limiting sweets.  She verbalized understanding  Objective:   Encounter Medications:  Outpatient Encounter Prescriptions as of 03/10/2016  Medication Sig Note  . ACCU-CHEK FASTCLIX LANCETS MISC  02/02/2015: Received from: External Pharmacy  . ACCU-CHEK SMARTVIEW test strip  02/02/2015: Received from: External Pharmacy  . albuterol (PROVENTIL HFA;VENTOLIN HFA) 108 (90 BASE) MCG/ACT inhaler Inhale 2 puffs into the lungs every 6 (six) hours as needed for wheezing or shortness of breath.   [name] atorvastatin (LIPITOR) 40 MG tablet Take 40 mg by mouth daily.   . Azelastine-Fluticasone (DYMISTA) 137-50 MCG/ACT SUSP Place 2 sprays into the nose daily.   . BD INSULIN SYRINGE ULTRAFINE 31G X 5/16" 0.3 ML MISC  02/02/2015: Received from: External Pharmacy  . diltiazem (CARDIZEM CD) 240 MG 24 hr capsule TAKE 1 CAPSULE BY MOUTH EVERY MORNING ON AN EMPTY STOMACH - [name] DENIED, FAXED DR Johnsie Cancel 10/22/15 SS   . DULERA 100-5 MCG/ACT AERO INHALE 2 PUFFS INTO THE LUNGS TWICE DAILY   . DULoxetine (CYMBALTA) 60 MG capsule Take  60 mg by mouth 2 (two) times daily. Reported on 02/12/2016 10/24/2015: Received from: [facility] Received Sig: Take 60 mg by mouth.  . insulin regular human CONCENTRATED (HUMULIN R) 500 UNIT/ML SOLN injection Inject 50-100 Units into the skin See admin instructions. 500 Unit/ML-  150 units (25 unit markings on U-100 insulin syringe) 30 mins before breakfast,  90 units (18 unit marking on syringe) before lunch and 20 units (4 unit markings syringe) before evening meal [place] TID  (skip if missing a meal). 12/17/2015: Patient reports ordered 145 units in the AM then 100 units in the evening.  Patient states she is only taking AM dose.   [name] ipratropium-albuterol (DUONEB) 0.5-2.5 (3) MG/3ML SOLN Take 3 mLs by nebulization every 2 (two) hours as needed (shortness of breath).   [name] levothyroxine (SYNTHROID, LEVOTHROID) 100 MCG tablet Take 100 mcg by mouth daily before breakfast.   . nebivolol (BYSTOLIC) 10 MG tablet Take 1 tablet (10 mg total) by mouth daily.   [name] nystatin (MYCOSTATIN) 100000 UNIT/ML suspension Take 5 mLs (500,000 Units total) by mouth 4 (four) times daily.   [name] omeprazole (PRILOSEC) 40 MG capsule Take 40 mg by mouth daily.    [name] tiZANidine (ZANAFLEX) 4 MG tablet Take by mouth. 02/12/2016: Patient reports taking TID  . topiramate (TOPAMAX) 50 MG tablet Take 50 mg by mouth daily. Reported on 02/12/2016   . Vitamin D, Ergocalciferol, (DRISDOL) 50000 UNITS CAPS Take 50,000 Units by mouth every 7 (seven) days. Takes on Wed    No facility-administered  encounter medications on file as of 03/10/2016.     Functional Status:  In your present state of health, do you have any difficulty performing the following activities: 08/06/2015  Hearing? N  Vision? Y  Difficulty concentrating or making decisions? N  Walking or climbing stairs? Y  Dressing or bathing? N  Doing errands, shopping? Y  Preparing Food and eating ? N  Using the Toilet? N  In the past six months, have you accidently  leaked urine? N  Do you have problems with loss of bowel control? N  Managing your Medications? Y  Managing your Finances? N  Housekeeping or managing your Housekeeping? Y  Some recent data might be hidden    Fall/Depression Screening: PHQ 2/9 Scores 03/10/2016 02/12/2016 11/13/2015 09/21/2015 08/13/2015 08/06/2015 05/29/2015  PHQ - 2 Score 2 2 2 2 2 2 2   PHQ- 9 Score 7 7 7 7 7 7 7     Assessment: Patient continues to benefit from health coach outreach for disease management and support.    Plan:  [facility] CM Care Plan Problem One   Flowsheet Row Most Recent Value  Care Plan Problem One  knowledge deficit related to diabetes type 2  Role Documenting the Problem One  [place] for Problem One  Active  [facility] Long Term Goal (31-90 days)  Patient will maintain blood sugar average under 200 within 90 days.  [facility] Long Term Goal Start Date  03/10/16 [goal continued]  Interventions for Problem One Long Term Goal  RN Health Coach reviewed with patient diabetic diet and maintaining low carbohydrates.      [facility] CM Care Plan Problem Two   Flowsheet Row Most Recent Value  Care Plan Problem Two  Alternation in activity related to back, left side pain  Role Documenting the Problem Two  [place] for Problem Two  Active  Interventions for Problem Two Long Term Goal   RN Health Coach reviewed with patient different pain medicaton options for pain management.  Patient taking Zanaflex and has stopped ketamine infusions. Patient to get neurology referral   [facility] Long Term Goal (31-90) days  Pt will have adequately managed pain within 90 days  [facility] Long Term Goal Start Date  03/10/16 [facility] continued]     [place] will contact patient in the month of September and patient agrees to next outreach.  [name], RN, MSN [place] [phone]

## 2016-03-11 DIAGNOSIS — H2511 Age-related nuclear cataract, right eye: Secondary | ICD-10-CM | POA: Diagnosis not present

## 2016-03-11 DIAGNOSIS — H2512 Age-related nuclear cataract, left eye: Secondary | ICD-10-CM | POA: Diagnosis not present

## 2016-03-11 DIAGNOSIS — E119 Type 2 diabetes mellitus without complications: Secondary | ICD-10-CM | POA: Diagnosis not present

## 2016-03-17 ENCOUNTER — Encounter: Payer: Self-pay | Admitting: *Deleted

## 2016-03-18 ENCOUNTER — Encounter: Payer: Self-pay | Admitting: Neurology

## 2016-03-18 ENCOUNTER — Ambulatory Visit (INDEPENDENT_AMBULATORY_CARE_PROVIDER_SITE_OTHER): Payer: Commercial Managed Care - HMO | Admitting: Neurology

## 2016-03-18 VITALS — BP 140/84 | HR 110 | Ht 62.0 in | Wt 210.1 lb

## 2016-03-18 DIAGNOSIS — R202 Paresthesia of skin: Secondary | ICD-10-CM | POA: Diagnosis not present

## 2016-03-18 DIAGNOSIS — E1142 Type 2 diabetes mellitus with diabetic polyneuropathy: Secondary | ICD-10-CM | POA: Diagnosis not present

## 2016-03-18 MED ORDER — NORTRIPTYLINE HCL 10 MG PO CAPS: ORAL_CAPSULE | ORAL | 3 refills | Status: DC

## 2016-03-18 NOTE — Patient Instructions (Addendum)
1.  Start nortriptyline 14m at bedtime for 2 week, then increase to 2 tablet at bedtime 2.  NCS/EMG of the left arm and leg 3.  Start water exercises   Return to clinic in 6 months

## 2016-03-18 NOTE — Progress Notes (Signed)
[facility] Clinic Note - Initial Visit   Date: 03/18/16  Cheryl [name] MRN: [id number] DOB: [birth year]   Dear Dr. [name]:  Thank you for your kind referral of Cheryl [name] for consultation of paresthesias. Although her history is well known to you, please allow [name] to reiterate it for the purpose of our medical record. The patient was accompanied to the clinic by mother and nephew who also provides collateral information.     History of Present Illness: Cheryl [name] is a 50 y.o. right-handed Caucasian female with s/p C5-6 ACDF (2004), hypertension, hyperlipidemia, leukocytosis, fibromyalgia, poorly controlled diabetes, GERD, and chronic pain presenting for evaluation of bilateral arm and leg paresthesias.    For a number of years, she has been having numbness and tingling of the feet and lower legs and was diagnosed with diabetic neuropathy.  For the past several years, she has been experiencing increasing neck pain and was reevaluated by Dr. Sherwood Gambler who did her C5-6 ACDF who did not find any evidence of nerve compression or impingement on her MRI from 2014.  She has intermittent spells of electrical shock sensation over her shoulders, forearm, and hands and has been referred here for further evaluation, specifically to determine if her neuropathy is progressing into her arms.  She sometimes experiences heaviness of the arms and occasionally drops objects.  Symptoms are intermittent and worse with neck flexion or lifting objects.  She has tried many medications in the past including muscle relaxants, gabapentin, Lyrica, and amtriptyline but everything made her worse.  Neck PT also made her pain worse.    She has been using a walker for at least the past 10 years.  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  She went on disability for "sickness".    Out-side paper records, electronic medical record, and images have been reviewed where available and summarized as:  Labs 03/03/2016:  HbA1c 8.1 Labs 12/02/2015:  TSH 0.51  MRI cervical spine wwo contrast 06/24/2013: 1. Minimal prevertebral soft tissue edema suggested from C1 to C5 which may be reactive/degenerative in nature. No evidence of acute fracture or malalignment. 2. Prior C5-C6 ACDF with plate and screw fixation. Limited visualization of the ACDF secondary to patient motion. 3. Minimal disc bulging at C3-C4. No substantial disc herniation, canal, or foraminal stenosis is identified. 4. No abnormal cord signal identified.   Past Medical History:  Diagnosis Date  . Anxiety   . Asthma   . Chronic airway obstruction, not elsewhere classified   . Chronic pain    "q where; herniated disc in my tailbone" (02/09/2013)  . DDD (degenerative disc disease)    CERVIAL AND LUMBAR  . Depression   . Edema   . Fatty liver disease, nonalcoholic   . Fibromyalgia    "severe" (02/09/2013)  . Gastroparesis    from DM and chronic narcotic use  . GERD (gastroesophageal reflux disease)   . Human parvovirus infection   . Hyperlipidemia   . Hypertension   . Hypothyroidism   . Interstitial cystitis   . Migraine headache    "weekly; worse lately" (02/09/2013)  . OSA (obstructive sleep apnea)    "  have mask;; don't use it; no one came out to check it" (02/09/2013)  . Peripheral neuropathy ([place])    "severe" (02/09/2013)  . 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 PONV (postoperative nausea and vomiting)   . Positive PPD   . Shortness of breath    "at anytime; it's gotten worse recently" (02/09/2013)  . Type II diabetes  mellitus ([place])   . Vitamin D deficiency   . Vocal cord dysfunction     Past Surgical History:  Procedure Laterality Date  . ABDOMINAL HYSTERECTOMY  1993  . ANTERIOR CERVICAL DECOMP/DISCECTOMY FUSION  2004  . CARPAL TUNNEL RELEASE Bilateral ?1980's  . CHOLECYSTECTOMY  ?1990  . HIP SURGERY Right 2002   "did something to the tibula band" (02/09/2013)  . KNEE ARTHROSCOPY Left 1990's   "fell; clot behind knee cap had to be removed" (02/09/2013)  . NASAL SINUS SURGERY  1986; 1990's   "i've had 3" (02/09/2013)  . SHOULDER ARTHROSCOPY W/ ROTATOR CUFF REPAIR Right    "had to go deep in rotator cuff" (02/09/2013)     Medications:  Outpatient Encounter Prescriptions as of 03/18/2016  Medication Sig Note  . ACCU-CHEK FASTCLIX LANCETS MISC  02/02/2015: Received from: External Pharmacy  . ACCU-CHEK SMARTVIEW test strip  02/02/2015: Received from: External Pharmacy  . albuterol (PROVENTIL HFA;VENTOLIN HFA) 108 (90 BASE) MCG/ACT inhaler Inhale 2 puffs into the lungs every 6 (six) hours as needed for wheezing or shortness of breath.   [name] atorvastatin (LIPITOR) 40 MG tablet Take 40 mg by mouth daily.   . Azelastine-Fluticasone (DYMISTA) 137-50 MCG/ACT SUSP Place 2 sprays into the nose daily.   . BD INSULIN SYRINGE ULTRAFINE 31G X 5/16" 0.3 ML MISC  02/02/2015: Received from: External Pharmacy  . DULERA 100-5 MCG/ACT AERO INHALE 2 PUFFS INTO THE LUNGS TWICE DAILY   . DULoxetine (CYMBALTA) 60 MG capsule Take 60 mg by mouth 2 (two) times daily. Reported on 02/12/2016 10/24/2015: Received from: [facility] Received Sig: Take 60 mg by mouth.  [name] HUMULIN R U-500 KWIKPEN 500 UNIT/ML kwikpen  03/18/2016: Received from: External Pharmacy  . ipratropium-albuterol (DUONEB) 0.5-2.5 (3) MG/3ML SOLN Take 3 mLs by nebulization every 2 (two) hours as needed (shortness of breath).   [name] levothyroxine (SYNTHROID, LEVOTHROID) 100 MCG tablet Take 100 mcg by mouth daily before breakfast.   . nebivolol (BYSTOLIC)  10 MG tablet Take 1 tablet (10 mg total) by mouth daily.   [name] nystatin (MYCOSTATIN) 100000 UNIT/ML suspension Take 5 mLs (500,000 Units total) by mouth 4 (four) times daily.   [name] omeprazole (PRILOSEC) 40 MG capsule Take 40 mg by mouth daily.    [name] tiZANidine (ZANAFLEX) 4 MG tablet  03/18/2016: Received from: External Pharmacy  . Vitamin D, Ergocalciferol, (DRISDOL) 50000 UNITS CAPS Take 50,000 Units by mouth every 7 (seven) days. Takes on Wed   . diltiazem (CARDIZEM CD) 240 MG 24 hr capsule TAKE 1 CAPSULE BY MOUTH EVERY MORNING ON AN EMPTY STOMACH - [name] DENIED, FAXED DR Johnsie Cancel 10/22/15 SS   . topiramate (TOPAMAX) 50 MG tablet Take 50 mg by mouth daily. Reported on 02/12/2016   . [DISCONTINUED] insulin regular human CONCENTRATED (HUMULIN R) 500 UNIT/ML SOLN injection Inject 50-100 Units into the skin See admin instructions. 500 Unit/ML-  150 units (25 unit markings on U-100 insulin syringe) 30 mins before breakfast,  90 units (18 unit marking on syringe) before lunch and 20 units (4 unit  markings syringe) before evening meal [place] TID  (skip if missing a meal). 12/17/2015: Patient reports ordered 145 units in the AM then 100 units in the evening.  Patient states she is only taking AM dose.    No facility-administered encounter medications on file as of 03/18/2016.      Allergies:  Allergies  Allergen Reactions  . Influenza Vaccines Anaphylaxis and Swelling    Throat swelling, "flu" symptoms  . Iodinated Diagnostic Agents Nausea And Vomiting, Nausea Only, Rash and Shortness Of Breath  . Iohexol Anaphylaxis, Hives and Swelling     Desc: hives,dyspnea; throat swelling; ok w/ premeds and omnipaque   . Levofloxacin Anaphylaxis and Hives  . Nucynta [Tapentadol Hydrochloride] Other (See Comments)    Hallucinations and  insomnia x3 days  . Pregabalin Other (See Comments)    Reaction-Syncope & unable to speak  . Sulfonamide Derivatives Anaphylaxis  . Vilazodone Hcl Other (See Comments)     Hallucinations  . Amoxicillin Hives  . Biaxin [Clarithromycin] Hives  . Carbamazepine Itching  . Ciprofloxacin Hives  . Dilaudid [Hydromorphone Hcl] Itching and Rash    redness  . Percocet [Oxycodone-Acetaminophen] Rash  . Tapentadol Other (See Comments)    Family History: Family History  Problem Relation Age of Onset  . Coronary artery disease Father     Social History: Social History  Substance Use Topics  . Smoking status: Former Smoker    Packs/day: 0.10    Years: 3.00    Types: Cigarettes    Quit date: 07/28/2005  . Smokeless tobacco: Never Used  . Alcohol use No   Social History   Social History Narrative   Lives with daughter in a one story home.  On disability.  Education: college.      Review of Systems:  CONSTITUTIONAL: No fevers, chills, night sweats, or weight loss.   EYES: No visual changes or eye pain ENT: No hearing changes.  No history of nose bleeds.   RESPIRATORY: No cough, wheezing and shortness of breath.   CARDIOVASCULAR: Negative for chest pain, and palpitations.   GI: Negative for abdominal discomfort, blood in stools or black stools.  No recent change in bowel habits.   GU:  No history of incontinence.   MUSCLOSKELETAL: +history of joint pain or swelling.  +myalgias.   SKIN: Negative for lesions, rash, and itching.   HEMATOLOGY/ONCOLOGY: Negative for prolonged bleeding, bruising easily, and swollen nodes.  +history of cancer.   ENDOCRINE: Negative for cold or heat intolerance, polydipsia or goiter.   PSYCH:  +depression +anxiety symptoms.   NEURO: As Above.   Vital Signs:  BP 140/84   Pulse (!) 110   Ht 5' 2"  (1.575 m)   Wt 210 lb 1 oz (95.3 kg)   SpO2 98%   BMI 38.42 kg/m    General Medical Exam:   General:  Worried and anxious appearing, comfortable.   Eyes/ENT: see cranial nerve examination.   Neck: No masses appreciated.  Markedly reduced neck rotation and extension.  No carotid bruits. Respiratory:  Clear to auscultation, good  air entry bilaterally.   Cardiac:  Regular rate and rhythm, no murmur.   Extremities:  No deformities, edema, or skin discoloration.  Skin:  No rashes or lesions.  Neurological Exam: MENTAL STATUS including orientation to time, place, person, recent and remote memory, attention span and concentration, language, and fund of knowledge is fair.  Speech is not dysarthric.  CRANIAL NERVES: II:  No visual field defects.  Unremarkable fundi.  III-IV-VI: Pupils equal round and reactive to light.  Normal conjugate, extra-ocular eye movements in all directions of gaze.  No nystagmus.  No ptosis.   V:  Normal facial sensation.   VII:  Normal facial symmetry and movements.  VIII:  Normal hearing and vestibular function.   IX-X:  Normal palatal movement.   XI:  Normal shoulder shrug and head rotation.   XII:  Normal tongue strength and range of motion, no deviation or fasciculation.  MOTOR:  Intermittent and variable right hand tremor in multiple planes (flapping), worse when she is engaging the arm and distractible at rest.  No pronator drift.  Tone is normal.    Right Upper Extremity:    Left Upper Extremity:    Deltoid  5/5   Deltoid  5/5   Biceps  5/5   Biceps  5/5   Triceps  5/5   Triceps  5/5   Wrist extensors  5/5   Wrist extensors  5/5   Wrist flexors  5/5   Wrist flexors  5/5   Finger extensors  5/5   Finger extensors  5/5   Finger flexors  5/5   Finger flexors  5/5   Dorsal interossei  5/5   Dorsal interossei  5/5   Abductor pollicis  5/5   Abductor pollicis  5/5   Tone (Ashworth scale)  0  Tone (Ashworth scale)  0   Right Lower Extremity:    Left Lower Extremity:    Hip flexors  5/5   Hip flexors  5/5   Hip extensors  5/5   Hip extensors  5/5   Knee flexors  5/5   Knee flexors  5/5   Knee extensors  5/5   Knee extensors  5/5   Dorsiflexors  5/5   Dorsiflexors  5/5   Plantarflexors  5/5   Plantarflexors  5/5   Toe extensors  5/5   Toe extensors  5/5   Toe flexors  5/5   Toe  flexors  5/5   Tone (Ashworth scale)  0  Tone (Ashworth scale)  0   MSRs:  Right                                                                 Left brachioradialis 2+  brachioradialis 2+  biceps 2+  biceps 2+  triceps 2+  triceps 2+  patellar 2+  patellar 2+  ankle jerk 2+  ankle jerk 2+  Hoffman no  Hoffman no  plantar response down  plantar response down   SENSORY:  Temperature is reduced over the lower legs and feet, otherwise, normal and symmetric perception of light touch, pinprick, vibration, and proprioception.    COORDINATION/GAIT: Normal finger-to- nose-finger and heel-to-shin.  Intact rapid alternating movements bilaterally.  Gait is wide based and stable, unassisted.  She is able to stand on heels and toes.    IMPRESSION: Cheryl [name] is a 50 year-old female with multiple comorbidities including poorly controlled diabetes referred for evaluation of generalized paresthesias.  Her exam shows right hand tremor, previously evaluated with [facility] at [facility], and diagnosed as psychogenic. Sensation to temperature is reduced in the lower legs, which may represent diabetic neuropathy, but I would expect diminished reflexes and distal strength which is  normal.  Sensation in the arms is normal.  NCS/EMG of the left arm and leg will be ordered to characterize the nature of her paresthesias; with her history, diabetic neuropathy is likely, but I would expect more of a length-dependent pattern.  For symptom management, start nortriptyline 29m at bedtime for 2 week, then increase to 2 tablet at bedtime.  Common side effects discussed.  I strongly encouraged her to start neck PT, but due to cost, she does not to wish to do this. Instead, [facility] also suggested by her family.   Return to clinic in 6 months.   The duration of this appointment visit was 50 minutes of face-to-face time with the patient.  Greater than 50%  of this time was spent in counseling, explanation of diagnosis, planning of further management, and coordination of care.   Thank you for allowing me to participate in patient's care.  If I can answer any additional questions, I would be pleased to do so.    Sincerely,    Donika K. PPosey Pronto DO

## 2016-03-19 NOTE — Progress Notes (Signed)
Note routed

## 2016-04-08 ENCOUNTER — Encounter: Payer: Commercial Managed Care - HMO | Admitting: Neurology

## 2016-04-10 ENCOUNTER — Other Ambulatory Visit: Payer: Self-pay

## 2016-04-10 NOTE — Patient Outreach (Signed)
[facility] [facility]) Care Management  [place]  04/10/2016   Cheryl [name] 1966-05-21 [id number]  Subjective: Telephone call to patient for monthly call. Patient reports she is doing ok. Still dealing with pain issues.  Saw neurologist since last conversation and was placed on nortriptyline for nerve pain. Patient reports she is to have nerve conduction test and EMG this month. Patient states that neurologist thinks her nerve pain is more from her diabetes neuropathy getting worse. Patient reports that her sugars are up and down.  Early this morning she woke up to a sugar of 45 but after eating something her sugar was 354. Patient reports she has even had spikes up to 500. Discussed with patient eating regularly will her control her sugars as patient eats sporadically.  She verbalized understanding.    Objective:   Encounter Medications:  Outpatient Encounter Prescriptions as of 04/10/2016  Medication Sig Note  . ACCU-CHEK FASTCLIX LANCETS MISC  02/02/2015: Received from: External Pharmacy  . ACCU-CHEK SMARTVIEW test strip  02/02/2015: Received from: External Pharmacy  . albuterol (PROVENTIL HFA;VENTOLIN HFA) 108 (90 BASE) MCG/ACT inhaler Inhale 2 puffs into the lungs every 6 (six) hours as needed for wheezing or shortness of breath.   [name] atorvastatin (LIPITOR) 40 MG tablet Take 40 mg by mouth daily.   . Azelastine-Fluticasone (DYMISTA) 137-50 MCG/ACT SUSP Place 2 sprays into the nose daily.   . BD INSULIN SYRINGE ULTRAFINE 31G X 5/16" 0.3 ML MISC  02/02/2015: Received from: External Pharmacy  . diltiazem (CARDIZEM CD) 240 MG 24 hr capsule TAKE 1 CAPSULE BY MOUTH EVERY MORNING ON AN EMPTY STOMACH - [name] DENIED, FAXED DR Johnsie Cancel 10/22/15 SS   . DULERA 100-5 MCG/ACT AERO INHALE 2 PUFFS INTO THE LUNGS TWICE DAILY   . DULoxetine (CYMBALTA) 60 MG capsule Take 60 mg by mouth 2 (two) times daily. Reported on 02/12/2016 10/24/2015: Received from: [facility]  Received Sig: Take 60 mg by mouth.  [name] HUMULIN R U-500 KWIKPEN 500 UNIT/ML kwikpen  03/18/2016: Received from: External Pharmacy  . ipratropium-albuterol (DUONEB) 0.5-2.5 (3) MG/3ML SOLN Take 3 mLs by nebulization every 2 (two) hours as needed (shortness of breath).   [name] levothyroxine (SYNTHROID, LEVOTHROID) 100 MCG tablet Take 100 mcg by mouth daily before breakfast.   . nebivolol (BYSTOLIC) 10 MG tablet Take 1 tablet (10 mg total) by mouth daily.   . nortriptyline (PAMELOR) 10 MG capsule Start nortriptyline 36m at bedtime for 2 week, then increase to 2 tablet at bedtime   . nystatin (MYCOSTATIN) 100000 UNIT/ML suspension Take 5 mLs (500,000 Units total) by mouth 4 (four) times daily.   .Marland Kitchenomeprazole (PRILOSEC) 40 MG capsule Take 40 mg by mouth daily.    .Marland KitchentiZANidine (ZANAFLEX) 4 MG tablet  03/18/2016: Received from: External Pharmacy  . topiramate (TOPAMAX) 50 MG tablet Take 50 mg by mouth daily. Reported on 02/12/2016   . Vitamin D, Ergocalciferol, (DRISDOL) 50000 UNITS CAPS Take 50,000 Units by mouth every 7 (seven) days. Takes on Wed    No facility-administered encounter medications on file as of 04/10/2016.     Functional Status:  In your present state of health, do you have any difficulty performing the following activities: 08/06/2015  Hearing? N  Vision? Y  Difficulty concentrating or making decisions? N  Walking or climbing stairs? Y  Dressing or bathing? N  Doing errands, shopping? Y  Preparing Food and eating ? N  Using the Toilet? N  In the  past six months, have you accidently leaked urine? N  Do you have problems with loss of bowel control? N  Managing your Medications? Y  Managing your Finances? N  Housekeeping or managing your Housekeeping? Y  Some recent data might be hidden    Fall/Depression Screening: PHQ 2/9 Scores 04/10/2016 03/10/2016 02/12/2016 11/13/2015 09/21/2015 08/13/2015 08/06/2015  PHQ - 2 Score 0 2 2 2 2 2 2   PHQ- 9 Score - 7 7 7 7 7 7     Assessment: Patient  continues to benefit from health coach outreach for disease management and support.    Plan:  [facility] CM Care Plan Problem One   Flowsheet Row Most Recent Value  Care Plan Problem One  knowledge deficit related to diabetes type 2  Role Documenting the Problem One  [place] for Problem One  Active  THN Long Term Goal (31-90 days)  Patient will maintain blood sugar average under 200 within 90 days.  THN Long Term Goal Start Date  04/10/16 [goal continued]  Interventions for Problem One Long Term Goal  RN Health Coach reviewed with patient diabetic diet and maintaining low carbohydrates.      THN CM Care Plan Problem Two   Flowsheet Row Most Recent Value  Care Plan Problem Two  Alternation in activity related to back, left side pain  Role Documenting the Problem Two  [place] for Problem Two  Active  Interventions for Problem Two Long Term Goal   RN Health Coach discussed with patient different pain medicaton options for pain management.  Patient taking Zanaflex and has stopped ketamine infusions. Patient seen by neurologist and taking nortriptyline.  THN Long Term Goal (31-90) days  Pt will have adequately managed pain within 90 days  THN Long Term Goal Start Date  04/10/16 [facility] continued]     [place] will contact patient in the month of October and patient agrees to next outreach.  [name], RN, MSN [place] [phone]

## 2016-04-15 ENCOUNTER — Ambulatory Visit (INDEPENDENT_AMBULATORY_CARE_PROVIDER_SITE_OTHER): Payer: Commercial Managed Care - HMO | Admitting: Neurology

## 2016-04-15 DIAGNOSIS — R202 Paresthesia of skin: Secondary | ICD-10-CM | POA: Diagnosis not present

## 2016-04-15 DIAGNOSIS — E1142 Type 2 diabetes mellitus with diabetic polyneuropathy: Secondary | ICD-10-CM

## 2016-04-15 NOTE — Procedures (Signed)
[facility] Neurology  [place], [place]  [place], [place] [address] Tel: [phone] Fax:  [phone] Test Date:  04/15/2016  Patient: Cheryl [name] DOB: 05-Sep-1965 Physician: Narda Amber, DO  Sex: Female Height: 5' 2"  Ref Phys: Narda Amber, DO  ID#: [id number] Temp: 33.2C Technician: Jerilynn Mages. Dean   Patient Complaints: This is a 50 year old female with history of C5-6 ACDF (2004), diabetes mellitus, chronic pain syndrome, and fibromyalgia referred for evaluation of bilateral arm and leg paresthesias and pain.  NCV & EMG Findings: Extensive electrodiagnostic testing of the left upper and lower extremity shows: 1. All sensory responses including the left median, ulnar, mixed palmar, sural, and sural sensory nerves are within normal limits. 2. All motor responses including the left median, ulnar, peroneal, and tibial nerves are within normal limits. 3. There is no evidence of active or chronic motor axon loss changes affecting any of the tested muscles.  Motor unit configuration and recruitment pattern is within normal limits.  Impression: This is a normal study. In particular, there is no evidence of a generalized sensorimotor polyneuropathy affecting the left side.   ___________________________ Narda Amber, DO    Nerve Conduction Studies Anti Sensory Summary Table   Site NR Peak (ms) Norm Peak (ms) P-T Amp (V) Norm P-T Amp  Left Median Anti Sensory (2nd Digit)  Wrist    3.4 <3.4 31.0 >20  Left Sup Peroneal Anti Sensory (Ant Lat Mall)  12 cm    2.7 <4.5 8.5 >5  Left Sural Anti Sensory (Lat Mall)  Calf    3.3 <4.5 5.5 >5  Left Ulnar Anti Sensory (5th Digit)  Wrist    2.7 <3.1 33.3 >12   Motor Summary Table   Site NR Onset (ms) Norm Onset (ms) O-P Amp (mV) Norm O-P Amp Site1 Site2 Delta-0 (ms) Dist (cm) Vel (m/s) Norm Vel (m/s)  Left Median Motor (Abd Poll Brev)  Wrist    3.4 <3.9 6.2 >6 Elbow Wrist 3.6 22.0 61 >50  Elbow    7.0  6.0         Left Peroneal  Motor (Ext Dig Brev)  Ankle    3.1 <5.5 4.2 >3 B Fib Ankle 6.4 29.0 45 >40  B Fib    9.5  3.9  Poplt B Fib 1.7 10.0 59 >40  Poplt    11.2  3.8         Left Tibial Motor (Abd Hall Brev)  Ankle    3.0 <6.0 9.6 >8 Knee Ankle 7.5 38.0 51 >40  Knee    10.5  6.2         Left Ulnar Motor (Abd Dig Minimi)  Wrist    2.5 <3.1 8.9 >7 B Elbow Wrist 3.1 19.0 61 >50  B Elbow    5.6  8.7  A Elbow B Elbow 1.5 10.0 67 >50  A Elbow    7.1  7.9          Comparison Summary Table   Site NR Peak (ms) Norm Peak (ms) P-T Amp (V) Site1 Site2 Delta-P (ms) Norm Delta (ms)  Left Median/Ulnar Palm Comparison (Wrist - 8cm)  Median Palm    1.8 <2.2 41.1 Median Palm Ulnar Palm 0.3   Ulnar Palm    1.5 <2.2 13.3       EMG   Side Muscle Ins Act Fibs Psw Fasc Number Recrt Dur Dur. Amp Amp. Poly Poly. Comment  Left AntTibialis Nml Nml Nml Nml Nml Nml Nml Nml Nml  Nml Nml Nml N/A  Left 1stDorInt Nml Nml Nml Nml Nml Nml Nml Nml Nml Nml Nml Nml N/A  Left Ext Indicis Nml Nml Nml Nml Nml Nml Nml Nml Nml Nml Nml Nml N/A  Left PronatorTeres Nml Nml Nml Nml Nml Nml Nml Nml Nml Nml Nml Nml N/A  Left Biceps Nml Nml Nml Nml Nml Nml Nml Nml Nml Nml Nml Nml N/A  Left Triceps Nml Nml Nml Nml Nml Nml Nml Nml Nml Nml Nml Nml N/A  Left Deltoid Nml Nml Nml Nml Nml Nml Nml Nml Nml Nml Nml Nml N/A  Left Gastroc Nml Nml Nml Nml Nml Nml Nml Nml Nml Nml Nml Nml N/A  Left Flex Dig Long Nml Nml Nml Nml Nml Nml Nml Nml Nml Nml Nml Nml N/A  Left RectFemoris Nml Nml Nml Nml Nml Nml Nml Nml Nml Nml Nml Nml N/A  Left GluteusMed Nml Nml Nml Nml Nml Nml Nml Nml Nml Nml Nml Nml N/A  Left Lumbo Parasp Low Nml Nml Nml Nml Nml Nml Nml Nml Nml Nml Nml Nml N/A      Waveforms:

## 2016-04-21 ENCOUNTER — Other Ambulatory Visit: Payer: Self-pay | Admitting: Cardiovascular Disease

## 2016-04-21 MED ORDER — NEBIVOLOL HCL 10 MG PO TABS: 10.0000 mg | ORAL_TABLET | Freq: Every day | ORAL | 0 refills | Status: DC

## 2016-05-07 ENCOUNTER — Other Ambulatory Visit: Payer: Self-pay

## 2016-05-07 NOTE — Patient Outreach (Signed)
[facility] [facility]) Care Management  [place]  05/07/2016   Cheryl [name] 1965-08-13 [id number]  Subjective: Telephone call to patient for monthly call.  Patient reports she is having generalized pain today.  Patient continues to take nortriptyline for pain.  Nerve conduction test was normal.  Patient reports blood sugars have been elevated.  Today's blood sugar was 285. Discussed with patient importance of keeping blood sugars under control and affects on the body from high blood sugars.  She verbalized understanding.  Patient shared that she was expecting her first grandchild and has been trying be active in preparation for the baby's arrival.  No concerns.   Objective:   Encounter Medications:  Outpatient Encounter Prescriptions as of 05/07/2016  Medication Sig Note  . ACCU-CHEK FASTCLIX LANCETS MISC  02/02/2015: Received from: External Pharmacy  . ACCU-CHEK SMARTVIEW test strip  02/02/2015: Received from: External Pharmacy  . albuterol (PROVENTIL HFA;VENTOLIN HFA) 108 (90 BASE) MCG/ACT inhaler Inhale 2 puffs into the lungs every 6 (six) hours as needed for wheezing or shortness of breath.   [name] atorvastatin (LIPITOR) 40 MG tablet Take 40 mg by mouth daily.   . Azelastine-Fluticasone (DYMISTA) 137-50 MCG/ACT SUSP Place 2 sprays into the nose daily.   . BD INSULIN SYRINGE ULTRAFINE 31G X 5/16" 0.3 ML MISC  02/02/2015: Received from: External Pharmacy  . diltiazem (CARDIZEM CD) 240 MG 24 hr capsule TAKE 1 CAPSULE BY MOUTH EVERY MORNING ON AN EMPTY STOMACH - [name] DENIED, FAXED DR Johnsie Cancel 10/22/15 SS   . DULERA 100-5 MCG/ACT AERO INHALE 2 PUFFS INTO THE LUNGS TWICE DAILY   . DULoxetine (CYMBALTA) 60 MG capsule Take 60 mg by mouth 2 (two) times daily. Reported on 02/12/2016 10/24/2015: Received from: [facility] Received Sig: Take 60 mg by mouth.  [name] HUMULIN R U-500 KWIKPEN 500 UNIT/ML kwikpen  03/18/2016: Received from: External Pharmacy  .  ipratropium-albuterol (DUONEB) 0.5-2.5 (3) MG/3ML SOLN Take 3 mLs by nebulization every 2 (two) hours as needed (shortness of breath).   [name] levothyroxine (SYNTHROID, LEVOTHROID) 100 MCG tablet Take 100 mcg by mouth daily before breakfast.   . nebivolol (BYSTOLIC) 10 MG tablet Take 1 tablet (10 mg total) by mouth daily.   . nortriptyline (PAMELOR) 10 MG capsule Start nortriptyline 52m at bedtime for 2 week, then increase to 2 tablet at bedtime   . nystatin (MYCOSTATIN) 100000 UNIT/ML suspension Take 5 mLs (500,000 Units total) by mouth 4 (four) times daily.   .Marland Kitchenomeprazole (PRILOSEC) 40 MG capsule Take 40 mg by mouth daily.    .Marland KitchentiZANidine (ZANAFLEX) 4 MG tablet  03/18/2016: Received from: External Pharmacy  . topiramate (TOPAMAX) 50 MG tablet Take 50 mg by mouth daily. Reported on 02/12/2016   . Vitamin D, Ergocalciferol, (DRISDOL) 50000 UNITS CAPS Take 50,000 Units by mouth every 7 (seven) days. Takes on Wed    No facility-administered encounter medications on file as of 05/07/2016.     Functional Status:  In your present state of health, do you have any difficulty performing the following activities: 08/06/2015  Hearing? N  Vision? Y  Difficulty concentrating or making decisions? N  Walking or climbing stairs? Y  Dressing or bathing? N  Doing errands, shopping? Y  Preparing Food and eating ? N  Using the Toilet? N  In the past six months, have you accidently leaked urine? N  Do you have problems with loss of bowel control? N  Managing your Medications? Y  Managing  your Finances? N  Housekeeping or managing your Housekeeping? Y  Some recent data might be hidden    Fall/Depression Screening: PHQ 2/9 Scores 05/07/2016 04/10/2016 03/10/2016 02/12/2016 11/13/2015 09/21/2015 08/13/2015  PHQ - 2 Score 0 0 2 2 2 2 2   PHQ- 9 Score - - 7 7 7 7 7     Assessment: Patient continues to benefit from health coach outreach for disease management and support.    Plan:  [facility] CM Care Plan Problem One    Flowsheet Row Most Recent Value  Care Plan Problem One  knowledge deficit related to diabetes type 2  Role Documenting the Problem One  [place] for Problem One  Active  THN Long Term Goal (31-90 days)  Patient will maintain blood sugar average under 200 within 90 days.  THN Long Term Goal Start Date  05/07/16 [goal continued]  Interventions for Problem One Long Term Goal  RN Health Coach reiterated with patient diabetic diet and maintaining low carbohydrates.      THN CM Care Plan Problem Two   Flowsheet Row Most Recent Value  Care Plan Problem Two  Alternation in activity related to back, left side pain  Role Documenting the Problem Two  [place] for Problem Two  Active  Interventions for Problem Two Long Term Goal   RN Health Coach reviewed with patient different pain medicaton options for pain management.  Patient taking Zanaflex and has stopped ketamine infusions. Patient seen by neurologist and taking nortriptyline.  THN Long Term Goal (31-90) days  Pt will have adequately managed pain within 90 days  THN Long Term Goal Start Date  05/07/16 [facility] continued]     [place] will contact patient in the month of November and patient agrees to next outreach.  [name], RN, MSN [place] [phone]

## 2016-05-13 DIAGNOSIS — G894 Chronic pain syndrome: Secondary | ICD-10-CM | POA: Diagnosis not present

## 2016-05-13 DIAGNOSIS — M5414 Radiculopathy, thoracic region: Secondary | ICD-10-CM | POA: Diagnosis not present

## 2016-05-13 DIAGNOSIS — M546 Pain in thoracic spine: Secondary | ICD-10-CM | POA: Diagnosis not present

## 2016-05-13 DIAGNOSIS — G8929 Other chronic pain: Secondary | ICD-10-CM | POA: Diagnosis not present

## 2016-05-14 ENCOUNTER — Ambulatory Visit: Payer: Commercial Managed Care - HMO | Admitting: Neurology

## 2016-05-19 DIAGNOSIS — G894 Chronic pain syndrome: Secondary | ICD-10-CM | POA: Diagnosis not present

## 2016-06-05 ENCOUNTER — Ambulatory Visit: Payer: Commercial Managed Care - HMO | Admitting: Internal Medicine

## 2016-06-05 ENCOUNTER — Other Ambulatory Visit: Payer: Self-pay

## 2016-06-05 NOTE — Patient Outreach (Signed)
[place] [facility]) Care Management  06/05/2016  Reneka Nebergall People 1966/03/29 [id number]   Telephone call to patient for monthly call. No answer.  HIPAA compliant voice message left.  Plan: RN Health Coach will attempt patient again in the month of November.  [name], RN, MSN [place] [phone]

## 2016-06-12 ENCOUNTER — Other Ambulatory Visit: Payer: Self-pay | Admitting: Cardiovascular Disease

## 2016-06-12 NOTE — Telephone Encounter (Signed)
Should this be deferred to patients pcp as patient is prn follow up with Dr Johnsie Cancel? Please advise. Thanks, MI

## 2016-06-18 DIAGNOSIS — G8929 Other chronic pain: Secondary | ICD-10-CM | POA: Diagnosis not present

## 2016-06-18 DIAGNOSIS — G894 Chronic pain syndrome: Secondary | ICD-10-CM | POA: Diagnosis not present

## 2016-06-18 DIAGNOSIS — M546 Pain in thoracic spine: Secondary | ICD-10-CM | POA: Diagnosis not present

## 2016-06-20 ENCOUNTER — Other Ambulatory Visit: Payer: Self-pay | Admitting: Cardiovascular Disease

## 2016-06-24 ENCOUNTER — Other Ambulatory Visit: Payer: Self-pay

## 2016-06-24 NOTE — Patient Outreach (Signed)
[place] [facility]) Care Management  06/24/2016  Cheryl [name] 11-Dec-1965 [id number]   Telephone call to patient for monthly call. No answer.  HIPAA compliant voice message left.  Plan: RN Health Coach will attempt patient again in the month of December.    [name], RN, MSN [place] [phone]

## 2016-06-27 ENCOUNTER — Other Ambulatory Visit: Payer: Self-pay | Admitting: Internal Medicine

## 2016-06-27 DIAGNOSIS — D2262 Melanocytic nevi of left upper limb, including shoulder: Secondary | ICD-10-CM | POA: Diagnosis not present

## 2016-06-27 DIAGNOSIS — N63 Unspecified lump in unspecified breast: Secondary | ICD-10-CM

## 2016-06-27 DIAGNOSIS — L821 Other seborrheic keratosis: Secondary | ICD-10-CM | POA: Diagnosis not present

## 2016-06-27 DIAGNOSIS — Z85820 Personal history of malignant melanoma of skin: Secondary | ICD-10-CM | POA: Diagnosis not present

## 2016-06-27 DIAGNOSIS — D485 Neoplasm of uncertain behavior of skin: Secondary | ICD-10-CM | POA: Diagnosis not present

## 2016-06-27 DIAGNOSIS — L814 Other melanin hyperpigmentation: Secondary | ICD-10-CM | POA: Diagnosis not present

## 2016-06-27 DIAGNOSIS — D2272 Melanocytic nevi of left lower limb, including hip: Secondary | ICD-10-CM | POA: Diagnosis not present

## 2016-06-27 DIAGNOSIS — L570 Actinic keratosis: Secondary | ICD-10-CM | POA: Diagnosis not present

## 2016-06-27 DIAGNOSIS — D2271 Melanocytic nevi of right lower limb, including hip: Secondary | ICD-10-CM | POA: Diagnosis not present

## 2016-06-27 DIAGNOSIS — D225 Melanocytic nevi of trunk: Secondary | ICD-10-CM | POA: Diagnosis not present

## 2016-06-30 DIAGNOSIS — G894 Chronic pain syndrome: Secondary | ICD-10-CM | POA: Diagnosis not present

## 2016-06-30 DIAGNOSIS — M546 Pain in thoracic spine: Secondary | ICD-10-CM | POA: Diagnosis not present

## 2016-06-30 DIAGNOSIS — M797 Fibromyalgia: Secondary | ICD-10-CM | POA: Diagnosis not present

## 2016-07-04 ENCOUNTER — Other Ambulatory Visit: Payer: Self-pay

## 2016-07-04 ENCOUNTER — Other Ambulatory Visit: Payer: Commercial Managed Care - HMO

## 2016-07-04 NOTE — Patient Outreach (Signed)
[facility] [facility]) Care Management  [place]  07/04/2016   Cheryl [name] May 09, 1966 [id number]  Subjective: Telephone call from patient for monthly call. Patient reports she is having some pain. Patient reports she has started back taking ketamine infusion with pain management. Patient reports her blood sugars are some higher. Patient gave 02-07-29 day averages.  Discussed with patient blood sugar control.  She verbalized understanding.    Objective:   Encounter Medications:  Outpatient Encounter Prescriptions as of 07/04/2016  Medication Sig Note  . ACCU-CHEK FASTCLIX LANCETS MISC  02/02/2015: Received from: External Pharmacy  . ACCU-CHEK SMARTVIEW test strip  02/02/2015: Received from: External Pharmacy  . albuterol (PROVENTIL HFA;VENTOLIN HFA) 108 (90 BASE) MCG/ACT inhaler Inhale 2 puffs into the lungs every 6 (six) hours as needed for wheezing or shortness of breath.   [name] atorvastatin (LIPITOR) 40 MG tablet Take 40 mg by mouth daily.   . Azelastine-Fluticasone (DYMISTA) 137-50 MCG/ACT SUSP Place 2 sprays into the nose daily.   . BD INSULIN SYRINGE ULTRAFINE 31G X 5/16" 0.3 ML MISC  02/02/2015: Received from: External Pharmacy  . diltiazem (CARDIZEM CD) 240 MG 24 hr capsule TAKE 1 CAPSULE BY MOUTH EVERY MORNING ON AN EMPTY STOMACH - [name] DENIED, FAXED DR Johnsie Cancel 10/22/15 SS   . DULERA 100-5 MCG/ACT AERO INHALE 2 PUFFS INTO THE LUNGS TWICE DAILY   . HUMULIN R U-500 KWIKPEN 500 UNIT/ML kwikpen  03/18/2016: Received from: External Pharmacy  . ipratropium-albuterol (DUONEB) 0.5-2.5 (3) MG/3ML SOLN Take 3 mLs by nebulization every 2 (two) hours as needed (shortness of breath).   [name] levothyroxine (SYNTHROID, LEVOTHROID) 100 MCG tablet Take 100 mcg by mouth daily before breakfast.   . nebivolol (BYSTOLIC) 10 MG tablet Take 1 tablet (10 mg total) by mouth daily.   . nortriptyline (PAMELOR) 10 MG capsule Start nortriptyline 69m at bedtime for 2 week, then increase to 2 tablet at  bedtime   . nystatin (MYCOSTATIN) 100000 UNIT/ML suspension Take 5 mLs (500,000 Units total) by mouth 4 (four) times daily.   .Marland Kitchenomeprazole (PRILOSEC) 40 MG capsule Take 40 mg by mouth daily.    .Marland KitchentiZANidine (ZANAFLEX) 4 MG tablet  03/18/2016: Received from: External Pharmacy  . topiramate (TOPAMAX) 50 MG tablet Take 50 mg by mouth daily. Reported on 02/12/2016 07/04/2016: Patient reports 2 tabs twice a day.  . Vitamin D, Ergocalciferol, (DRISDOL) 50000 UNITS CAPS Take 50,000 Units by mouth every 7 (seven) days. Takes on Wed   . DULoxetine (CYMBALTA) 60 MG capsule Take 60 mg by mouth 2 (two) times daily. Reported on 02/12/2016 10/24/2015: [facility] mg [facility]   No facility-administered encounter medications on file as of 07/04/2016.     Functional Status:  In your present state of health, do you have any difficulty performing the following activities: 08/06/2015  Hearing? N  Vision? Y  Difficulty concentrating or making decisions? N  Walking or climbing stairs? Y  Dressing or bathing? N  Doing errands, shopping? Y  Preparing Food and eating ? N  Using the Toilet? N  In the past six months, have you accidently leaked urine? N  Do you have problems with loss of bowel control? N  Managing your Medications? Y  Managing your Finances? N  Housekeeping or managing your Housekeeping? Y  Some recent data might be hidden    Fall/Depression Screening: PHQ 2/9 Scores 07/04/2016 05/07/2016 04/10/2016 03/10/2016 02/12/2016 11/13/2015 09/21/2015  PHQ -  2 Score 2 0 0 2 2 2 2   PHQ- 9 Score 7 - - 7 7 7 7     Assessment: Patient continues to benefit from health coach outreach for disease management and support.    Plan:  [facility] CM Care Plan Problem One   Flowsheet Row Most Recent Value  Care Plan Problem One  knowledge deficit related to diabetes type 2  Role Documenting the Problem One  [place] for Problem One  Active  THN Long  Term Goal (31-90 days)  Patient will maintain blood sugar average under 200 within 90 days.  THN Long Term Goal Start Date  07/04/16 [goal continued]  Interventions for Problem One Long Term Goal  RN Health Coach reviewed with patient diabetic diet and maintaining low carbohydrates.      THN CM Care Plan Problem Two   Flowsheet Row Most Recent Value  Care Plan Problem Two  Alternation in activity related to back, left side pain  Role Documenting the Problem Two  [place] for Problem Two  Active  Interventions for Problem Two Long Term Goal   RN Health Coach reviewed with patient different pain medicaton options for pain management.  Patient taking Zanaflex and has started back taking ketamine infusions. Patient seen by neurologist and taking nortriptyline.  THN Long Term Goal (31-90) days  Pt will have adequately managed pain within 90 days  THN Long Term Goal Start Date  07/04/16 [facility] continued]     [place] will contact patient in the month of January and patient agrees to next outreach.  [name], RN, MSN [place] [phone]

## 2016-07-11 ENCOUNTER — Ambulatory Visit
Admission: RE | Admit: 2016-07-11 | Discharge: 2016-07-11 | Disposition: A | Discharge status: Home / Self Care | Payer: Commercial Managed Care - HMO | Source: Ambulatory Visit | Attending: Internal Medicine | Admitting: Internal Medicine

## 2016-07-11 DIAGNOSIS — N63 Unspecified lump in unspecified breast: Secondary | ICD-10-CM

## 2016-07-11 DIAGNOSIS — N6310 Unspecified lump in the right breast, unspecified quadrant: Secondary | ICD-10-CM | POA: Diagnosis not present

## 2016-07-14 ENCOUNTER — Other Ambulatory Visit: Payer: Self-pay | Admitting: Cardiovascular Disease

## 2016-07-14 ENCOUNTER — Ambulatory Visit: Payer: Self-pay

## 2016-07-15 DIAGNOSIS — Z915 Personal history of self-harm: Secondary | ICD-10-CM | POA: Diagnosis not present

## 2016-07-15 DIAGNOSIS — M519 Unspecified thoracic, thoracolumbar and lumbosacral intervertebral disc disorder: Secondary | ICD-10-CM | POA: Diagnosis not present

## 2016-07-15 DIAGNOSIS — M546 Pain in thoracic spine: Secondary | ICD-10-CM | POA: Diagnosis not present

## 2016-07-15 DIAGNOSIS — M797 Fibromyalgia: Secondary | ICD-10-CM | POA: Diagnosis not present

## 2016-08-06 ENCOUNTER — Other Ambulatory Visit: Payer: Self-pay

## 2016-08-06 NOTE — Patient Outreach (Signed)
[place] [facility]) Care Management  08/06/2016  Cheryl [name] 11/30/1965 [id number]   Telephone call to patient for monthly call.  No answer.  HIPAA compliant voice message left.  Plan: RN Health Coach will attempt patient again in the month of January.    [name], RN, MSN [place] [phone]

## 2016-08-11 ENCOUNTER — Other Ambulatory Visit: Payer: Self-pay | Admitting: Internal Medicine

## 2016-08-11 ENCOUNTER — Ambulatory Visit
Admission: RE | Admit: 2016-08-11 | Discharge: 2016-08-11 | Disposition: A | Discharge status: Home / Self Care | Payer: Medicare Other | Source: Ambulatory Visit | Attending: Internal Medicine | Admitting: Internal Medicine

## 2016-08-11 DIAGNOSIS — M25511 Pain in right shoulder: Secondary | ICD-10-CM

## 2016-08-18 ENCOUNTER — Other Ambulatory Visit: Payer: Self-pay

## 2016-08-18 NOTE — Patient Outreach (Signed)
[place] [facility]) Care Management  08/18/2016  Satomi Buda Sanna 09-Apr-1966 [id number]   2nd Telephone call to patient for monthly call.  No answer. HIPAA compliant voice message left.  Plan: RN Health Coach will attempt patient in the month of February.  [name], RN, MSN [place] [phone]

## 2016-09-05 ENCOUNTER — Other Ambulatory Visit: Payer: Self-pay

## 2016-09-05 NOTE — Patient Outreach (Signed)
[place] [facility]) Care Management  09/05/2016  Cassidi Modesitt [place] 1965-08-09 [id number]   Telephone call to patient for monthly call.  No answer.  HIPAA compliant voice message left.  Plan: RN Health Coach will attempt patient again in the month of February.  [name], RN, MSN [place] [phone]

## 2016-09-09 ENCOUNTER — Other Ambulatory Visit: Payer: Self-pay

## 2016-09-09 NOTE — Patient Outreach (Signed)
[facility]) Care Management  [place]  09/09/2016   ELLIOTT QUADE 1966/04/03 [id number]  Subjective: Telephone call to patient for monthly call.  Patient reports she is doing ok today.  Patient reports she has had some rocky times the last month or so with family stress and stress of changing her insurance.  Patient reports that she is seeking comprehensive pain management at this time.  Patient reports she has had some problems with depression recently.  Depression scoring done.  Patient admits she has not been taking her antidepressant. Discussed with patient the importance of taking medications as prescribed and that dealing with depression is tough but knowing when to seek help.  She verbalized understanding and states she thought she could handle it on her own but realizes she cannot.  Patient reports she will start back taking her Cymbalta.  Patient does not have any thoughts of suicide at this time.  Patient reports that she has done well with her diet as her daughter has gestational diabetes and has really done well to help her daughter.   She does not have readings but states her last A1c was 8.7.  Encouraged patient to continue her diabetic diet to control her sugars.  She verbalized understanding.    Objective:   Encounter Medications:  Outpatient Encounter Prescriptions as of 09/09/2016  Medication Sig Note  . ACCU-CHEK FASTCLIX LANCETS MISC  02/02/2015: Received from: External Pharmacy  . ACCU-CHEK SMARTVIEW test strip  02/02/2015: Received from: External Pharmacy  . albuterol (PROVENTIL HFA;VENTOLIN HFA) 108 (90 BASE) MCG/ACT inhaler Inhale 2 puffs into the lungs every 6 (six) hours as needed for wheezing or shortness of breath.   [name] atorvastatin (LIPITOR) 40 MG tablet Take 40 mg by mouth daily.   . Azelastine-Fluticasone (DYMISTA) 137-50 MCG/ACT SUSP Place 2 sprays into the nose daily.   . BD INSULIN SYRINGE ULTRAFINE 31G X 5/16" 0.3 ML MISC  02/02/2015:  Received from: External Pharmacy  . diltiazem (CARDIZEM CD) 240 MG 24 hr capsule TAKE 1 CAPSULE BY MOUTH EVERY MORNING ON AN EMPTY STOMACH - [name] DENIED, FAXED DR Johnsie Cancel 10/22/15 SS   . DULERA 100-5 MCG/ACT AERO INHALE 2 PUFFS INTO THE LUNGS TWICE DAILY   . HUMULIN R U-500 KWIKPEN 500 UNIT/ML kwikpen  03/18/2016: Received from: External Pharmacy  . ipratropium-albuterol (DUONEB) 0.5-2.5 (3) MG/3ML SOLN Take 3 mLs by nebulization every 2 (two) hours as needed (shortness of breath).   [name] levothyroxine (SYNTHROID, LEVOTHROID) 100 MCG tablet Take 100 mcg by mouth daily before breakfast.   . nebivolol (BYSTOLIC) 10 MG tablet Take 1 tablet (10 mg total) by mouth daily.   . nortriptyline (PAMELOR) 10 MG capsule Start nortriptyline 76m at bedtime for 2 week, then increase to 2 tablet at bedtime   . nystatin (MYCOSTATIN) 100000 UNIT/ML suspension Take 5 mLs (500,000 Units total) by mouth 4 (four) times daily.   .Marland Kitchenomeprazole (PRILOSEC) 40 MG capsule Take 40 mg by mouth daily.    .Marland KitchentiZANidine (ZANAFLEX) 4 MG tablet  03/18/2016: Received from: External Pharmacy  . topiramate (TOPAMAX) 50 MG tablet Take 50 mg by mouth daily. Reported on 02/12/2016 07/04/2016: Patient reports 2 tabs twice a day.  . Vitamin D, Ergocalciferol, (DRISDOL) 50000 UNITS CAPS Take 50,000 Units by mouth every 7 (seven) days. Takes on Wed   . DULoxetine (CYMBALTA) 60 MG capsule Take 60 mg by mouth 2 (two) times daily. Reported on 02/12/2016 09/09/2016: Patient reports stopping taking it but states  she will start back   No facility-administered encounter medications on file as of 09/09/2016.     Functional Status:  In your present state of health, do you have any difficulty performing the following activities: 09/09/2016  Hearing? N  Vision? Y  Difficulty concentrating or making decisions? N  Walking or climbing stairs? Y  Dressing or bathing? N  Doing errands, shopping? Y  Preparing Food and eating ? N  Using the Toilet? N  In the  past six months, have you accidently leaked urine? N  Do you have problems with loss of bowel control? N  Managing your Medications? Y  Managing your Finances? N  Housekeeping or managing your Housekeeping? Y  Some recent data might be hidden    Fall/Depression Screening: PHQ 2/9 Scores 09/09/2016 07/04/2016 05/07/2016 04/10/2016 03/10/2016 02/12/2016 11/13/2015  PHQ - 2 Score 6 2 0 0 2 2 2   PHQ- 9 Score 20 7 - - 7 7 7     Assessment: Patient continues to benefit from health coach outreach for disease management and support.    Plan:  [facility] CM Care Plan Problem One   Flowsheet Row Most Recent Value  Care Plan Problem One  knowledge deficit related to diabetes type 2  Role Documenting the Problem One  [place] for Problem One  Active  [facility]N Long Term Goal (31-90 days)  Patient will maintain blood sugar average under 200 within 90 days.  THN Long Term Goal Start Date  09/09/16 [goal continued]  Interventions for Problem One Long Term Goal  RN Health Coach reiterated with patient diabetic diet and maintaining low carbohydrates.      [facility]N CM Care Plan Problem Two   Flowsheet Row Most Recent Value  Care Plan Problem Two  Alternation in activity related to back, left side pain  Role Documenting the Problem Two  [place] for Problem Two  Active  Interventions for Problem Two Long Term Goal   RN Health Coach discussed with patient different pain medicaton options for pain management.  Patient taking Zanaflex and has started back taking ketamine infusions. Patient seen by neurologist and taking nortriptyline.  THN Long Term Goal (31-90) days  Pt will have adequately managed pain within 90 days  THN Long Term Goal Start Date  09/09/16 [goal continued]    [facility] CM Care Plan Problem Three   Flowsheet Row Most Recent Value  Care Plan Problem Three  Depression  Role Documenting the Problem [place] for Problem Three  Active  THN CM Short Term Goal #1  (0-30 days)  Patient will report taking antidepressant as ordered within the next 30 days.    THN CM Short Term Goal #1 Start Date  09/09/16  Interventions for Short Term Goal #1  RN Health Coach discussed with patient the importance of taking medications as prescribed and knowing how to deal with depression and when to seek help.       RN Health Coach will contact patient in the month of March and patient agrees to next outreach.  RN Health Coach will send physician update.    [name], RN, MSN [place] [phone]

## 2016-09-17 ENCOUNTER — Ambulatory Visit: Payer: Self-pay

## 2016-10-07 ENCOUNTER — Other Ambulatory Visit: Payer: Self-pay

## 2016-10-07 NOTE — Patient Outreach (Signed)
[name]) Care Management  10/07/2016  Cheryl [name] Aug 03, 1965 [id number]   Telephone call to patient for monthly call.  No answer.  HIPAA compliant voice message left.    Plan: RN Health Coach will attempt patient again in the month of March.  [name], RN, MSN [place] [phone]

## 2016-10-09 DIAGNOSIS — M797 Fibromyalgia: Secondary | ICD-10-CM | POA: Diagnosis not present

## 2016-10-09 DIAGNOSIS — H65191 Other acute nonsuppurative otitis media, right ear: Secondary | ICD-10-CM | POA: Diagnosis not present

## 2016-10-09 DIAGNOSIS — J0100 Acute maxillary sinusitis, unspecified: Secondary | ICD-10-CM | POA: Diagnosis not present

## 2016-10-13 DIAGNOSIS — M797 Fibromyalgia: Secondary | ICD-10-CM | POA: Diagnosis not present

## 2016-10-13 DIAGNOSIS — M546 Pain in thoracic spine: Secondary | ICD-10-CM | POA: Diagnosis not present

## 2016-10-13 DIAGNOSIS — G894 Chronic pain syndrome: Secondary | ICD-10-CM | POA: Diagnosis not present

## 2016-10-13 DIAGNOSIS — Z915 Personal history of self-harm: Secondary | ICD-10-CM | POA: Diagnosis not present

## 2016-10-20 ENCOUNTER — Other Ambulatory Visit: Payer: Self-pay

## 2016-10-20 NOTE — Patient Outreach (Signed)
[facility] ([facility]) Care Management  [facility] Care Manager  10/20/2016   Cheryl [name] 11/16/1965 [id number]  Subjective: Telephone call to patient for monthly call.  Patient reports she is doing ok and excited about her daughter being pregnant.  The baby is due any day. Patient has switched back to [employer] due to co-pay costs.  Patient reports generalized pain today but has not taken her pain medication today. Patient reports that her blood sugars have been all over the place.  Patient reports new primary physician Dr. Le.  Patient states she is pleased with her so far and will see her again on April 12 th for more in depth appointment. Discussed with patient diabetic diet and continuing to limit carbohydrates and sweets.  She verbalized understanding.    Objective:   Encounter Medications:  Outpatient Encounter Prescriptions as of 10/20/2016  Medication Sig Note  . ACCU-CHEK FASTCLIX LANCETS MISC  02/02/2015: Received from: External Pharmacy  . ACCU-CHEK SMARTVIEW test strip  02/02/2015: Received from: External Pharmacy  . albuterol (PROVENTIL HFA;VENTOLIN HFA) 108 (90 BASE) MCG/ACT inhaler Inhale 2 puffs into the lungs every 6 (six) hours as needed for wheezing or shortness of breath.   . atorvastatin (LIPITOR) 40 MG tablet Take 40 mg by mouth daily.   . Azelastine-Fluticasone (DYMISTA) 137-50 MCG/ACT SUSP Place 2 sprays into the nose daily.   . BD INSULIN SYRINGE ULTRAFINE 31G X 5/16" 0.3 ML MISC  02/02/2015: Received from: External Pharmacy  . diltiazem (CARDIZEM CD) 240 MG 24 hr capsule TAKE 1 CAPSULE BY MOUTH EVERY MORNING ON AN EMPTY STOMACH - [name] DENIED, FAXED DR NISHAN 10/22/15 SS   . DULERA 100-5 MCG/ACT AERO INHALE 2 PUFFS INTO THE LUNGS TWICE DAILY   . gabapentin (NEURONTIN) 300 MG capsule Take 300 mg by mouth 4 (four) times daily. Taking 1 tab in the morning and at noon,  2 capsules in the evening and 4 capsules at bedtime.   . HUMULIN R U-500 KWIKPEN 500 UNIT/ML kwikpen   03/18/2016: Received from: External Pharmacy  . ibuprofen (ADVIL,MOTRIN) 800 MG tablet Take 800 mg by mouth 2 (two) times daily.   . ipratropium-albuterol (DUONEB) 0.5-2.5 (3) MG/3ML SOLN Take 3 mLs by nebulization every 2 (two) hours as needed (shortness of breath).   . levothyroxine (SYNTHROID, LEVOTHROID) 100 MCG tablet Take 100 mcg by mouth daily before breakfast.   . nebivolol (BYSTOLIC) 10 MG tablet Take 1 tablet (10 mg total) by mouth daily.   . nortriptyline (PAMELOR) 10 MG capsule Start nortriptyline 10mg at bedtime for 2 week, then increase to 2 tablet at bedtime   . nystatin (MYCOSTATIN) 100000 UNIT/ML suspension Take 5 mLs (500,000 Units total) by mouth 4 (four) times daily.   . omeprazole (PRILOSEC) 40 MG capsule Take 40 mg by mouth daily.    . tiZANidine (ZANAFLEX) 4 MG tablet  03/18/2016: Received from: External Pharmacy  . topiramate (TOPAMAX) 50 MG tablet Take 50 mg by mouth daily. Reported on 02/12/2016 07/04/2016: Patient reports 2 tabs twice a day.  . Vitamin D, Ergocalciferol, (DRISDOL) 50000 UNITS CAPS Take 50,000 Units by mouth every 7 (seven) days. Takes on Wed   . DULoxetine (CYMBALTA) 60 MG capsule Take 60 mg by mouth 2 (two) times daily. Reported on 02/12/2016 09/09/2016: Patient reports stopping taking it but states she will start back   No facility-administered encounter medications on file as of 10/20/2016.     Functional Status:  In your present state of health, do you   have any difficulty performing the following activities: 09/09/2016  Hearing? N  Vision? Y  Difficulty concentrating or making decisions? N  Walking or climbing stairs? Y  Dressing or bathing? N  Doing errands, shopping? Y  Preparing Food and eating ? N  Using the Toilet? N  In the past six months, have you accidently leaked urine? N  Do you have problems with loss of bowel control? N  Managing your Medications? Y  Managing your Finances? N  Housekeeping or managing your Housekeeping? Y  Some  recent data might be hidden    Fall/Depression Screening: PHQ 2/9 Scores 10/20/2016 09/09/2016 07/04/2016 05/07/2016 04/10/2016 03/10/2016 02/12/2016  PHQ - 2 Score _0 0 0 2 2  PHQ- 9 Score - 20 7 - - 7 7    Assessment: Patient continues to benefit from health coach outreach for disease management and support.    Plan:  [facility] CM Care Plan Problem One     Most Recent Value  Care Plan Problem One  knowledge deficit related to diabetes type 2  Role Documenting the Problem One  [facility] [place] for Problem One  Active  [facility] Long Term Goal (31-90 days)  Patient will maintain blood sugar average under 200 within 90 days.  [facility]N Long Term Goal Start Date  10/20/16 [goal continued]  Interventions for Problem One Long Term Goal  RN Health Coach reinforced with patient diabetic diet and maintaining low carbohydrates.      [facility]N CM Care Plan Problem Two     Most Recent Value  Care Plan Problem Two  Alternation in activity related to back, left side pain  Role Documenting the Problem Two  [place] for Problem Two  Active  Interventions for Problem Two Long Term Goal   RN Health Coach reviewed with patient different pain medicaton options for pain management.  Patient taking Zanaflex and has started back taking ketamine infusions. Patient seen by neurologist and taking nortriptyline.  THN Long Term Goal (31-90) days  Pt will have adequately managed pain within 90 days  [facility]N Long Term Goal Start Date  10/20/16 [goal continued]    [facility] CM Care Plan Problem Three     Most Recent Value  Care Plan Problem Three  Depression  Role Documenting the Problem [place] for Problem Three  Active  [facility]N CM Short Term Goal #1 (0-30 days)  Patient will report taking antidepressant as ordered within the next 30 days.    [facility]N CM Short Term Goal #1 Start Date  09/09/16  [facility] CM Short Term Goal #1 Met Date  10/20/16  Interventions for Short Term Goal #1  goal met     RN Health  Coach will contact patient in the month of April and patient agrees to next outreach.  [name], RN, MSN [place] [phone]

## 2016-11-06 DIAGNOSIS — R000 Tachycardia, unspecified: Secondary | ICD-10-CM | POA: Diagnosis not present

## 2016-11-06 DIAGNOSIS — M797 Fibromyalgia: Secondary | ICD-10-CM | POA: Diagnosis not present

## 2016-11-06 DIAGNOSIS — R002 Palpitations: Secondary | ICD-10-CM | POA: Diagnosis not present

## 2016-11-06 DIAGNOSIS — G894 Chronic pain syndrome: Secondary | ICD-10-CM | POA: Diagnosis not present

## 2016-11-06 DIAGNOSIS — I10 Essential (primary) hypertension: Secondary | ICD-10-CM | POA: Diagnosis not present

## 2016-11-17 ENCOUNTER — Other Ambulatory Visit: Payer: Self-pay

## 2016-11-17 NOTE — Patient Outreach (Signed)
[facility]) Care Management  11/17/2016  Cheryl [name] 11-12-1965 [id number]   Telephone call to patient for monthly call.  No answer.  HIPAA compliant voice message left.   Plan: RN Health Coach will attempt patient in the month of May.   [name], RN, MSN [place] [phone]

## 2016-11-18 NOTE — Progress Notes (Signed)
Patient ID: Cheryl [name], female   DOB: July 17, 1966, 51 y.o.   MRN: [id number]     Cardiology Office Note   Date:  11/21/2016   ID:  Cheryl [name], DOB [birth year], MRN [id number]  PCP:  Leotis Pain, DO  Cardiologist:   Jenkins Rouge, MD   Chief Complaint  Patient presents with  . Chest Wall Pain      History of Present Illness: Cheryl [name] is a 51 y.o. female who presents for f/u  of atypical chest pain. Last seen in 2016   Sees Dr [name] and complained of sharp pain under left breast in May.  She is a former smoker with asthma, OSA and COPD on oxygen She indicates being on ventolin and deulera but they don't help/  No active wheezing per her history.  Her pain is atypical sharp along left breast and side.  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  She sees [name] for thyroid and DM  On replacement   Reviewed myovue 2008 normal with EF 62%   F/U echo reviewed: normal byventricular function mild diastolic 1 disease no valve disease 02/12/15   She was started on bystolic for relatively high HR / BP and f/u with primary  Now having fluttering in chest Her TSH is suppressed Dr [name] lowered her synthroid Also needs f/u with Dr [name] to get back on CPAP Only taking 5 mg of her bystolic  RLE shin splint like pain    Past Medical History:  Diagnosis Date  . Anxiety   . Asthma   . Chronic airway obstruction, not elsewhere classified   . Chronic pain    "q where; herniated disc in my tailbone" (02/09/2013)  . DDD (degenerative disc disease)    CERVIAL AND LUMBAR  . Depression   . Edema   . Fatty liver disease, nonalcoholic   . Fibromyalgia    "severe" (02/09/2013)  . Gastroparesis    from DM and chronic narcotic use  . GERD (gastroesophageal reflux disease)   . Human parvovirus infection   . Hyperlipidemia   .  Hypertension   . Hypothyroidism   . Interstitial cystitis   . Migraine headache    "weekly; worse lately" (02/09/2013)  . OSA (obstructive sleep apnea)    "have mask;; don't use it; no one came out to check it" (02/09/2013)  . Peripheral neuropathy    "severe" (02/09/2013)  . 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 PONV (postoperative nausea and vomiting)   . Positive PPD   . Shortness of breath    "at anytime; it's gotten worse recently" (02/09/2013)  . Type II diabetes mellitus ([place])   . Vitamin D deficiency   . Vocal cord dysfunction     Past Surgical History:  Procedure Laterality Date  . ABDOMINAL HYSTERECTOMY  1993  . ANTERIOR CERVICAL DECOMP/DISCECTOMY FUSION  2004  . CARPAL TUNNEL RELEASE Bilateral ?1980's  . CHOLECYSTECTOMY  ?1990  . HIP SURGERY Right 2002   "did something to the tibula band" (02/09/2013)  . KNEE ARTHROSCOPY Left 1990's   "fell; clot behind knee cap had to be removed" (02/09/2013)  . NASAL SINUS SURGERY  1986; 1990's   "i've had 3" (02/09/2013)  . SHOULDER ARTHROSCOPY W/ ROTATOR CUFF REPAIR Right    "  had to go deep in rotator cuff" (02/09/2013)     Current Outpatient Prescriptions  Medication Sig Dispense Refill  . albuterol (PROVENTIL HFA;VENTOLIN HFA) 108 (90 BASE) MCG/ACT inhaler Inhale 2 puffs into the lungs every 6 (six) hours as needed for wheezing or shortness of breath. 3 Inhaler 1  . atorvastatin (LIPITOR) 40 MG tablet Take 40 mg by mouth daily.    . Azelastine-Fluticasone (DYMISTA) 137-50 MCG/ACT SUSP Place 2 sprays into the nose daily. 3 Bottle 1  . diltiazem (CARDIZEM CD) 240 MG 24 hr capsule TAKE 1 CAPSULE BY MOUTH EVERY MORNING ON AN EMPTY STOMACH - [name] DENIED, FAXED DR Johnsie Cancel 10/22/15 SS 30 capsule 5  . gabapentin (NEURONTIN) 300 MG capsule Take 300 mg by mouth 4 (four) times daily. Taking 1 tab in the morning and at noon,  2 capsules in the evening and 4 capsules at bedtime.    [name] HUMULIN  R U-500 KWIKPEN 500 UNIT/ML kwikpen     . ibuprofen (ADVIL,MOTRIN) 800 MG tablet Take 800 mg by mouth 2 (two) times daily.    [name] ipratropium-albuterol (DUONEB) 0.5-2.5 (3) MG/3ML SOLN Take 3 mLs by nebulization every 2 (two) hours as needed (shortness of breath). 1080 mL 1  . levothyroxine (SYNTHROID, LEVOTHROID) 75 MCG tablet Take 75 mcg by mouth daily before breakfast.    . nebivolol (BYSTOLIC) 10 MG tablet Take 1 tablet (10 mg total) by mouth daily. 90 tablet 3  . nortriptyline (PAMELOR) 25 MG capsule Take 25 mg by mouth at bedtime.    [name] omeprazole (PRILOSEC) 40 MG capsule Take 40 mg by mouth daily.     [name] tiZANidine (ZANAFLEX) 4 MG tablet     . topiramate (TOPAMAX) 50 MG tablet Take 50 mg by mouth daily. Reported on 02/12/2016    . Vitamin D, Ergocalciferol, (DRISDOL) 50000 UNITS CAPS Take 50,000 Units by mouth every 7 (seven) days. Takes on Wed     No current facility-administered medications for this visit.     Allergies:   Hydromorphone hcl; Influenza vaccines; Iodinated diagnostic agents; Iodine-131; Iohexol; Levofloxacin; Nucynta [tapentadol hydrochloride]; Pregabalin; Sulfonamide derivatives; Oxycodone-acetaminophen; Vilazodone hcl; Amoxicillin; Biaxin [clarithromycin]; Carbamazepine; Ciprofloxacin; Dilaudid [hydromorphone hcl]; Iodides; Percocet [oxycodone-acetaminophen]; and Tapentadol    Social History:  The patient  reports that she quit smoking about 11 years ago. Her smoking use included Cigarettes. She has a 0.30 pack-year smoking history. She has never used smokeless tobacco. She reports that she does not drink alcohol or use drugs.   Family History:  The patient's family history includes Coronary artery disease in her father.    ROS:  Please see the history of present illness.   Otherwise, review of systems are positive for none.   All other systems are reviewed and negative.    PHYSICAL EXAM: VS:  BP (!) 160/80   Pulse 92   Ht 5' 2"  (1.575 m)   Wt 212 lb (96.2 kg)    SpO2 98%   BMI 38.78 kg/m  , BMI Body mass index is 38.78 kg/m. Affect appropriate Chronically ill white female Overweigtht  HEENT: vocal cord instability  Neck supple with no adenopathy JVP normal no bruits no thyromegaly Lungs clear with no wheezing and good diaphragmatic motion Heart:  S1/S2 no murmur, no rub, gallop or click PMI normal Abdomen: benighn, BS positve, no tenderness, no AAA no bruit.  No HSM or HJR Distal pulses intact with no bruits No edema Neuro non-focal Skin warm and dry Scar LUE from melanoma surgery  No muscular weakness    EKG: 11/2014  ST rate 113 poor R wave progression  02/02/15  ST rate 103 otherwise normal  03/29/15 ST rate 117  Otherwise normal  11/21/16 SR rate 86 normal    Recent Labs: 12/12/2015: TSH 0.445 02/11/2016: ALT 32; BUN 8.2; Creatinine 0.8; HGB 13.4; Platelets 320; Potassium 3.7; Sodium 138    Lipid Panel    Component Value Date/Time   CHOL (H) 02/26/2008 1826    442        ATP III CLASSIFICATION:  <200     mg/dL   Desirable  200-239  mg/dL   Borderline High  >=240    mg/dL   High   TRIG 1038 RESULTS CONFIRMED BY MANUAL DILUTION (H) 02/26/2008 1826   HDL 41 02/26/2008 1826   CHOLHDL 10.8 02/26/2008 1826   VLDL UNABLE TO CALCULATE IF TRIGLYCERIDE OVER 400 mg/dL 02/26/2008 1826   LDLCALC  02/26/2008 1826    UNABLE TO CALCULATE IF TRIGLYCERIDE OVER 400 mg/dL        Total Cholesterol/HDL:CHD Risk Coronary Heart Disease Risk Table                     Men   Women  1/2 Average Risk   3.4   3.3      Wt Readings from Last 3 Encounters:  11/21/16 212 lb (96.2 kg)  03/18/16 210 lb 1 oz (95.3 kg)  02/19/16 208 lb (94.3 kg)      Other studies Reviewed: Additional studies/ records that were reviewed today include: Xrays, ECG and Epic notes.    ASSESSMENT AND PLAN:  1.  Chest Pain:  Atypical coronary calcium on CT not advanced for age observe 2. Fluttering:  Take whole 10 mg tablet bystolic related to obesity, thyroid and OSA    3. OSA:  CPAP needs to be reinitiated f/u Dr [name]  4. Chol:   Cholesterol is at goal.  Continue current dose of statin and diet Rx.  No myalgias or side effects.  F/U  LFT's in 6 months. Lab Results  Component Value Date   LDLCALC  02/26/2008    UNABLE TO CALCULATE IF TRIGLYCERIDE OVER 400 mg/dL        Total Cholesterol/HDL:CHD Risk Coronary Heart Disease Risk Table                     Men   Women  1/2 Average Risk   3.4   3.3  F/U labs with primary LDL goal less than 100 with positive coronary  calcium          HTN:  Well controlled.  Continue current medications and low sodium [place] type diet.    DM:  Discussed low carb diet.  Target hemoglobin A1c is 6.5 or less.  Continue current medications.  Thyroid:  On replacement TSH with Dr [name] dose reduced recently due to suppressed TSH   Current medicines are reviewed at length with the patient today.  The patient does not have concerns regarding medicines.  The following changes have been made:  Bystolic 10 mg   Labs/ tests ordered today include:     Orders Placed This Encounter  Procedures  . EKG 12-Lead     Disposition:   FU with me 3 months     Signed, Jenkins Rouge, MD  11/21/2016 11:22 AM    [facility] [address], [place], [place]  [address] Phone: [phone]; Fax: [phone]   

## 2016-11-21 ENCOUNTER — Encounter: Payer: Self-pay | Admitting: Cardiovascular Disease

## 2016-11-21 ENCOUNTER — Ambulatory Visit (INDEPENDENT_AMBULATORY_CARE_PROVIDER_SITE_OTHER): Payer: Medicare HMO | Admitting: Cardiovascular Disease

## 2016-11-21 ENCOUNTER — Telehealth: Payer: Self-pay | Admitting: Internal Medicine

## 2016-11-21 VITALS — BP 160/80 | HR 92 | Ht 62.0 in | Wt 212.0 lb

## 2016-11-21 DIAGNOSIS — R0789 Other chest pain: Secondary | ICD-10-CM | POA: Diagnosis not present

## 2016-11-21 MED ORDER — NEBIVOLOL HCL 10 MG PO TABS: 10.0000 mg | ORAL_TABLET | Freq: Every day | ORAL | 3 refills | Status: DC

## 2016-11-21 NOTE — Patient Instructions (Addendum)
Medication Instructions:  Your physician has recommended you make the following change in your medication:  1-INCREASED Bystolic 10 mg by mouth daily   Labwork: NONE  Testing/Procedures: NONE  Follow-Up: Your physician wants you to follow-up in: 3 months with Dr. Johnsie Cancel.   If you need a refill on your cardiac medications before your next appointment, please call your pharmacy.

## 2016-11-21 NOTE — Telephone Encounter (Signed)
Katie- Please see what we can do. She may need to accept NP.

## 2016-11-21 NOTE — Telephone Encounter (Signed)
Pt c/o worsening sob, intermittent "heart fluttering", smothering feeling worse when laying down. Pt states she was previously on cpap, but has not been on one recently.  Pt is unsure if she needs to go back on cpap.  Pt requesting appt-is requesting to see CY only.    Pt notes she saw cardiologist today to follow up on heart fluttering.    CY please advise if pt can be worked in to schedule before next available- which is July.  Thanks.

## 2016-11-24 NOTE — Telephone Encounter (Signed)
There are openings tomorrow morning. Please have her come in then. Thanks.

## 2016-11-24 NOTE — Telephone Encounter (Signed)
Pt scheduled for tomorrow morning.  Nothing further needed.

## 2016-11-25 ENCOUNTER — Other Ambulatory Visit: Payer: Self-pay

## 2016-11-25 ENCOUNTER — Ambulatory Visit (INDEPENDENT_AMBULATORY_CARE_PROVIDER_SITE_OTHER): Payer: Medicare HMO | Admitting: Internal Medicine

## 2016-11-25 ENCOUNTER — Telehealth: Payer: Self-pay | Admitting: Internal Medicine

## 2016-11-25 ENCOUNTER — Encounter: Payer: Self-pay | Admitting: Internal Medicine

## 2016-11-25 VITALS — BP 124/60 | HR 73 | Resp 18 | Ht 62.0 in | Wt 212.6 lb

## 2016-11-25 DIAGNOSIS — J302 Other seasonal allergic rhinitis: Secondary | ICD-10-CM | POA: Diagnosis not present

## 2016-11-25 DIAGNOSIS — J328 Other chronic sinusitis: Secondary | ICD-10-CM | POA: Diagnosis not present

## 2016-11-25 DIAGNOSIS — G4733 Obstructive sleep apnea (adult) (pediatric): Secondary | ICD-10-CM

## 2016-11-25 DIAGNOSIS — R0902 Hypoxemia: Secondary | ICD-10-CM

## 2016-11-25 DIAGNOSIS — J45909 Unspecified asthma, uncomplicated: Secondary | ICD-10-CM | POA: Diagnosis not present

## 2016-11-25 MED ORDER — ALBUTEROL SULFATE HFA 108 (90 BASE) MCG/ACT IN AERS: 2.0000 | INHALATION_SPRAY | Freq: Four times a day (QID) | RESPIRATORY_TRACT | 12 refills | Status: DC | PRN

## 2016-11-25 MED ORDER — IPRATROPIUM-ALBUTEROL 0.5-2.5 (3) MG/3ML IN SOLN: 3.0000 mL | RESPIRATORY_TRACT | 3 refills | Status: DC | PRN

## 2016-11-25 MED ORDER — PHENYLEPHRINE HCL 1 % NA SOLN
3.0000 [drp] | Freq: Once | NASAL | Status: AC
  Administered 2016-11-25: 3 [drp] via NASAL

## 2016-11-25 MED ORDER — MOMETASONE FURO-FORMOTEROL FUM 100-5 MCG/ACT IN AERO: INHALATION_SPRAY | RESPIRATORY_TRACT | 12 refills | Status: DC

## 2016-11-25 NOTE — Assessment & Plan Note (Signed)
Very little wheezing and apparently quite good control at least over the last several months. She has been out of her bronchodilator medicines and admits that didn't seem to change in sense of palpitations. Her breathing has not gotten worse since starting Bystolic. Plan-refill scripts for her rescue albuterol inhaler, maintenance Dulera inhaler and albuterol nebulizer solution to have available. Watch impact on her heart rhythm if restarted.

## 2016-11-25 NOTE — Assessment & Plan Note (Signed)
Complaining of symptoms consistent with nasal congestion and eustachian dysfunction but without purulent discharge, headache or fever. I don't think she has a bacterial sinus infection. She may be bothered recently by Spring pollen allergy rhinitis. Plan-nasal Neo-Synephrine decongestant aerosol. We're avoiding steroids because of her diabetes. Discussed use of nasal saline gel and nasal saline rinse as needed.

## 2016-11-25 NOTE — Patient Outreach (Signed)
[facility]) Care Management  [place]  11/25/2016   Cheryl [name] 1966-07-10 [id number]  Subjective: Telephone call to patient for monthly call. Patient reports she is doing good.  She reports the birth of her new granddaughter.  She reports that she is happy about that and is adjusting.  Patient reports that she is enjoying her new primary care physician.  Patient taking ibuprofen for pain.  She reports that she thinks it works better.  Patient reports that her sugars continue to be all over the place.  Reiterated with patient continuing diabetic diet.  She verbalized understanding.    Objective:   Encounter Medications:  Outpatient Encounter Prescriptions as of 11/25/2016  Medication Sig Note  . albuterol (PROVENTIL HFA;VENTOLIN HFA) 108 (90 Base) MCG/ACT inhaler Inhale 2 puffs into the lungs every 6 (six) hours as needed for wheezing or shortness of breath.   [name] atorvastatin (LIPITOR) 40 MG tablet Take 40 mg by mouth daily.   . Azelastine-Fluticasone (DYMISTA) 137-50 MCG/ACT SUSP Place 2 sprays into the nose daily.   [name] diltiazem (CARDIZEM CD) 240 MG 24 hr capsule TAKE 1 CAPSULE BY MOUTH EVERY MORNING ON AN EMPTY STOMACH - [name] DENIED, FAXED DR Johnsie Cancel 10/22/15 SS   . gabapentin (NEURONTIN) 300 MG capsule Take 300 mg by mouth 4 (four) times daily. Taking 1 tab in the morning and at noon,  2 capsules in the evening and 4 capsules at bedtime.   [name] HUMULIN R U-500 KWIKPEN 500 UNIT/ML kwikpen  03/18/2016: Received from: External Pharmacy  . ibuprofen (ADVIL,MOTRIN) 800 MG tablet Take 800 mg by mouth 2 (two) times daily.   [name] ipratropium-albuterol (DUONEB) 0.5-2.5 (3) MG/3ML SOLN Take 3 mLs by nebulization every 2 (two) hours as needed (shortness of breath).   [name] levothyroxine (SYNTHROID, LEVOTHROID) 75 MCG tablet Take 75 mcg by mouth daily before breakfast.   . mometasone-formoterol (DULERA) 100-5 MCG/ACT AERO Inhale 2 puffs then rinse mouth, twice daily   . nebivolol  (BYSTOLIC) 10 MG tablet Take 1 tablet (10 mg total) by mouth daily.   . nortriptyline (PAMELOR) 25 MG capsule Take 25 mg by mouth at bedtime.   [name] omeprazole (PRILOSEC) 40 MG capsule Take 40 mg by mouth daily.    [name] tiZANidine (ZANAFLEX) 4 MG tablet  03/18/2016: Received from: External Pharmacy  . Vitamin D, Ergocalciferol, (DRISDOL) 50000 UNITS CAPS Take 50,000 Units by mouth every 7 (seven) days. Takes on Wed   . topiramate (TOPAMAX) 50 MG tablet Take 50 mg by mouth daily. Reported on 02/12/2016 07/04/2016: Patient reports 2 tabs twice a day.   No facility-administered encounter medications on file as of 11/25/2016.     Functional Status:  In your present state of health, do you have any difficulty performing the following activities: 09/09/2016  Hearing? N  Vision? Y  Difficulty concentrating or making decisions? N  Walking or climbing stairs? Y  Dressing or bathing? N  Doing errands, shopping? Y  Preparing Food and eating ? N  Using the Toilet? N  In the past six months, have you accidently leaked urine? N  Do you have problems with loss of bowel control? N  Managing your Medications? Y  Managing your Finances? N  Housekeeping or managing your Housekeeping? Y  Some recent data might be hidden    Fall/Depression Screening: Fall Risk  11/25/2016 10/20/2016 09/09/2016  Falls in the past year? Yes Yes Yes  Number falls in past yr: - - -  Injury  with Fall? - - -  Risk Factor Category  - - -  Risk for fall due to : - - -  Follow up - - -   PHQ 2/9 Scores 11/25/2016 10/20/2016 09/09/2016 07/04/2016 05/07/2016 04/10/2016 03/10/2016  PHQ - 2 Score _0 0 0 2  PHQ- 9 Score - - 20 7 - - 7    Assessment: Patient continues to benefit from health coach outreach for disease management and support.    Plan:  [facility] CM Care Plan Problem One     Most Recent Value  Care Plan Problem One  knowledge deficit related to diabetes type 2  Role Documenting the Problem One  Health [place] for  Problem One  Active  THN Long Term Goal (31-90 days)  Patient will maintain blood sugar average under 200 within 90 days.  THN Long Term Goal Start Date  11/25/16 [goal continued]  Interventions for Problem One Long Term Goal  RN Health Coach reiterated with patient diabetic diet and maintaining low carbohydrates.      [facility] CM Care Plan Problem Two     Most Recent Value  Care Plan Problem Two  Alternation in activity related to back, left side pain  Role Documenting the Problem Two  [place] for Problem Two  Active  Interventions for Problem Two Long Term Goal   goal met  THN Long Term Goal (31-90) days  Pt will have adequately managed pain within 90 days  THN Long Term Goal Start Date  10/20/16 [goal continued]  [facility] Long Term Goal Met Date  11/25/16     RN Health Coach will contact patient in the month of June and patient agrees to next outreach.  [name], RN, MSN [place] [phone]

## 2016-11-25 NOTE — Patient Instructions (Signed)
Script printed to see if we can get your nebulizer solution through Medicare DME  Scripts sent to [facility] refilling Dulera and albuterol rescue inhaler  Neb neo nasal       Dx seasonal allergic rhinitis  Try otc nasal saline gel  And saline rinse to help your nose  Order- schedule unattended home sleep test   Dx OSA, hypoxia

## 2016-11-25 NOTE — Telephone Encounter (Signed)
Spoke with Cheryl [name] at [facility], states that pt cannot get meds through [employer] not being in network with their company.  Pt will have to get this rx through a local pharmacy.   lmtcb X1 for pt to verify local pharmacy for these to be sent to.

## 2016-11-25 NOTE — Progress Notes (Signed)
Subjective:    Patient ID: Cheryl [name], female    DOB: 29-Nov-1965, 51 y.o.   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 Pt states hard to swallow as well. Complains of sore jaw joints with chewing and admits history of "TMJ" Describes easy choking while eating or talking. Has had previous speech therapy supervised modified barium swallow in 2014 for same complaint and admits she needs reminder about teaching points she was given then. History of panic attacks and anxiety. Mother suggests today's concerns primarily reflect effort by her other physicians to reduce her pain medicines and chronic Valium, thus exposing symptoms. No particular change in her breathing status.  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 She says problem is better controlled now on Bystolic. Feels stuffy/congested in her head with nasal pressure, some shifting numbness around her nose, ears stopped up without pain or headache. Admits to wheezing "a little" sometimes at night. No recent need for nebulizer. She is out of most of her bronchodilator medications without impact on her incidence of palpitation. Turned in oxygen over a year ago saying she couldn't afford it then. Asks reassessment of previously diagnosed OSA. Daughter confirms she snores loudly and holds her breath times.   Review of Systems- see HPI   + = pos Constitutional:   No-   weight loss, night sweats, fevers, chills,+irregular sleep schedule, +fatigue, lassitude. HEENT:   No-headaches, difficulty swallowing, tooth/dental problems, +sore throat,       No-  sneezing, itching, +ear ache, nasal congestion, post nasal drip,  CV:  chest pain, orthopnea, PND, swelling in lower extremities, anasarca,  dizziness, +palpitations Resp: No- acute  shortness of breath with exertion or at rest.              No- productive cough,   non-productive cough,  No- coughing up of blood.              No- change in color of mucus.   Skin: No-   rash or lesions. GI:  No-   heartburn, indigestion, abdominal pain, nausea, vomiting,  GU:  MS:  No-   joint pain or swelling.   Neuro-    +tremor Psych:  No- change in mood or affect. + depression or anxiety.  No memory loss.  Objective:   Physical Exam      OBJ- Physical Exam General- Alert, Oriented, + Somewhat anxious,  Distress- none acute. + overweight Skin- rash-none, lesions- none, excoriation- none Lymphadenopathy- none Head- atraumatic            Eyes- Gross vision intact, PERRLA, conjunctivae and secretions clear            Ears- Hearing, canals-normal            Nose-  turbinate edema-none, no-Septal dev, mucus, polyps, erosion, perforation             Throat- Mallampati II , mucosa clear ,  drainage- none, tonsils- atrophic,  Neck- flexible , trachea midline, no stridor , thyroid nl, carotid no bruit Chest - symmetrical excursion , unlabored           Heart/CV- RRR , no murmur , no gallop  , no rub, nl s1 s2                           - JVD- none , edema- none, stasis changes- none, varices- none           Lung- clear to P&A, wheeze- none, cough-None , dullness-none, rub- none           Chest wall-  Abd-  Br/ Gen/ Rectal- Not done, not indicated Extrem- cyanosis- none, clubbing, none, atrophy- none, strength- nl Neuro- Unremarkable to observation

## 2016-11-26 ENCOUNTER — Telehealth: Payer: Self-pay | Admitting: Cardiovascular Disease

## 2016-11-26 NOTE — Telephone Encounter (Signed)
New message   Pt c/o medication issue:  1. Name of Medication: nebivolol (BYSTOLIC) 10 MG tablet  2. How are you currently taking this medication (dosage and times per day)? 75m  3. Are you having a reaction (difficulty breathing--STAT)? no  4. What is your medication issue? Heart rate does not get over 100 with this medication. She wants to know her options and if she can take 524minstead of 10

## 2016-11-26 NOTE — Telephone Encounter (Signed)
Patient stated at this time she has not started Bystolic 10 mg, and is still taking Bystolic 5 mg. Patient stated her BP has been good, and her HR has dropped to 70's. Patient stated sometimes her HR drops as low as 55.  Patient stated she had some dizziness a couple of times as well. Informed patient to keep a record of her BP and HR for a week. Informed patient to check HR and BP before she takes her medication and when she feels dizzy. Encouraged patient to hold her medication if her HR and BP are too low and to call our office. Informed patient that her BP and HR readings are with in normal limits, from the information she has given. Patient stated she did not want to take the higher dose of Bystolic at this time until she has a record of her BP's. Patient will call back with BP readings.

## 2016-11-26 NOTE — Telephone Encounter (Signed)
Called and spoke with pt and she is aware of refills that have been sent to the local pharmacy due to her insurance.

## 2016-11-28 ENCOUNTER — Telehealth: Payer: Self-pay | Admitting: Internal Medicine

## 2016-11-28 MED ORDER — ALBUTEROL SULFATE HFA 108 (90 BASE) MCG/ACT IN AERS: 2.0000 | INHALATION_SPRAY | Freq: Four times a day (QID) | RESPIRATORY_TRACT | 3 refills | Status: DC | PRN

## 2016-11-28 MED ORDER — MOMETASONE FURO-FORMOTEROL FUM 100-5 MCG/ACT IN AERO: INHALATION_SPRAY | RESPIRATORY_TRACT | 3 refills | Status: DC

## 2016-11-28 NOTE — Telephone Encounter (Signed)
Called and spoke with pt and she is aware of refills that have been sent to the mail order pharmacy per her request.

## 2016-12-03 DIAGNOSIS — E1142 Type 2 diabetes mellitus with diabetic polyneuropathy: Secondary | ICD-10-CM | POA: Diagnosis not present

## 2016-12-03 DIAGNOSIS — E039 Hypothyroidism, unspecified: Secondary | ICD-10-CM | POA: Diagnosis not present

## 2016-12-03 DIAGNOSIS — Z794 Long term (current) use of insulin: Secondary | ICD-10-CM | POA: Diagnosis not present

## 2016-12-03 DIAGNOSIS — E782 Mixed hyperlipidemia: Secondary | ICD-10-CM | POA: Diagnosis not present

## 2016-12-03 DIAGNOSIS — E1165 Type 2 diabetes mellitus with hyperglycemia: Secondary | ICD-10-CM | POA: Diagnosis not present

## 2016-12-11 ENCOUNTER — Telehealth: Payer: Self-pay | Admitting: Internal Medicine

## 2016-12-11 DIAGNOSIS — J45909 Unspecified asthma, uncomplicated: Secondary | ICD-10-CM

## 2016-12-11 MED ORDER — IPRATROPIUM-ALBUTEROL 0.5-2.5 (3) MG/3ML IN SOLN: 3.0000 mL | RESPIRATORY_TRACT | 3 refills | Status: DC | PRN

## 2016-12-11 NOTE — Telephone Encounter (Signed)
Yes thanks and also her nebulizer medication

## 2016-12-11 NOTE — Telephone Encounter (Signed)
Spoke with the pt and notified of recs per CDY  She wants machine and Duoneb through  [place] sent to [facility] for this and rx for neb sol placed in [facility] folder outside of [place] office

## 2016-12-11 NOTE — Telephone Encounter (Signed)
Pt had nebulizer through [facility] who no longer accepts pt's insurance.  Pt is requesting a new order for a new nebulizer through a DME- was told told [facility] will cover her insurance.    CY ok to order new nebulizer?  Thanks!

## 2016-12-12 DIAGNOSIS — J45909 Unspecified asthma, uncomplicated: Secondary | ICD-10-CM | POA: Diagnosis not present

## 2016-12-15 ENCOUNTER — Telehealth: Payer: Self-pay | Admitting: Internal Medicine

## 2016-12-15 MED ORDER — IPRATROPIUM-ALBUTEROL 0.5-2.5 (3) MG/3ML IN SOLN
3.0000 mL | RESPIRATORY_TRACT | 3 refills | Status: DC | PRN
Start: 2016-12-15 — End: 2016-12-31

## 2016-12-15 NOTE — Telephone Encounter (Signed)
Called and spoke with the pt and she is aware of the neb meds that have been sent to the [facility] mail order pharmacy.  I have called [employer] [phone])  to initiate the PA for the [facility] 100/5---2 puffs BID.  The PA will go into review and this will take 24-72 hours.  REF# [id number]

## 2016-12-16 DIAGNOSIS — G4733 Obstructive sleep apnea (adult) (pediatric): Secondary | ICD-10-CM | POA: Diagnosis not present

## 2016-12-16 NOTE — Telephone Encounter (Signed)
Calling a/b PA [phone] refrence # [id number].Cheryl [name]

## 2016-12-16 NOTE — Telephone Encounter (Signed)
I have attempted to call [employer] back at the number given x 2.  It lets me go through the prompts and then places me on hold then hangs up.  Will try back later.

## 2016-12-17 ENCOUNTER — Other Ambulatory Visit: Payer: Self-pay | Admitting: Internal Medicine

## 2016-12-17 DIAGNOSIS — G4733 Obstructive sleep apnea (adult) (pediatric): Secondary | ICD-10-CM | POA: Diagnosis not present

## 2016-12-17 NOTE — Telephone Encounter (Signed)
Called to check status of PA for [facility], was forwarded to pharmacist to provide more clinical documentation for PA.  Further documentation was provided, which is being sent back to pharmacist for clinical review.    Covered alternatives are: Symbicort, breo, advair discus, advair hfa   Will await decision.

## 2016-12-18 ENCOUNTER — Other Ambulatory Visit: Payer: Self-pay | Admitting: *Deleted

## 2016-12-18 DIAGNOSIS — G4733 Obstructive sleep apnea (adult) (pediatric): Secondary | ICD-10-CM

## 2016-12-18 DIAGNOSIS — R0902 Hypoxemia: Secondary | ICD-10-CM

## 2016-12-23 NOTE — Telephone Encounter (Signed)
Will forward message to Joellen Jersey to follow up on PA status.

## 2016-12-26 DIAGNOSIS — D225 Melanocytic nevi of trunk: Secondary | ICD-10-CM | POA: Diagnosis not present

## 2016-12-26 DIAGNOSIS — D1801 Hemangioma of skin and subcutaneous tissue: Secondary | ICD-10-CM | POA: Diagnosis not present

## 2016-12-26 DIAGNOSIS — L821 Other seborrheic keratosis: Secondary | ICD-10-CM | POA: Diagnosis not present

## 2016-12-26 DIAGNOSIS — Z85820 Personal history of malignant melanoma of skin: Secondary | ICD-10-CM | POA: Diagnosis not present

## 2016-12-26 DIAGNOSIS — D2271 Melanocytic nevi of right lower limb, including hip: Secondary | ICD-10-CM | POA: Diagnosis not present

## 2016-12-26 DIAGNOSIS — D2272 Melanocytic nevi of left lower limb, including hip: Secondary | ICD-10-CM | POA: Diagnosis not present

## 2016-12-26 DIAGNOSIS — L814 Other melanin hyperpigmentation: Secondary | ICD-10-CM | POA: Diagnosis not present

## 2016-12-26 MED ORDER — FLUTICASONE-SALMETEROL 115-21 MCG/ACT IN AERO: 2.0000 | INHALATION_SPRAY | Freq: Two times a day (BID) | RESPIRATORY_TRACT | 3 refills | Status: DC

## 2016-12-26 NOTE — Telephone Encounter (Signed)
Called and spoke with pt and she is aware of new rx for the advair hfa.  Nothing further is needed.

## 2016-12-26 NOTE — Telephone Encounter (Signed)
Insurance won't pay for [employer], but will cover Advair HFA 115    Please tell her this is what we will do-         Inhale 2 puffs then risne mouth, twice daily, refill for 12 months

## 2016-12-26 NOTE — Telephone Encounter (Signed)
lmtcb x1 for pt. 

## 2016-12-26 NOTE — Telephone Encounter (Signed)
Pt returning call and can be reached @  [phone].Cheryl [name]

## 2016-12-26 NOTE — Telephone Encounter (Signed)
Called to check status of PA @ [phone], refrence # [id number] Spoke with Lovena Le, prior Josem Kaufmann has been denied Must try/fail alternatives before approving Dulera -- Breo, Advair Diskus and Advair HFA OR a letter can be written to give reason why alternatives should not be used.   Please advise Dr [name]. Thanks.

## 2016-12-29 ENCOUNTER — Other Ambulatory Visit: Payer: Self-pay

## 2016-12-29 NOTE — Patient Outreach (Signed)
[facility]) Care Management  [place]  12/29/2016   Cheryl [name] [birth year] [id number]  Subjective: Telephone call to patient for monthly call.  Patient reports she is having some back pain today but is taking ibuprofen as ordered by her primary care physician along with her gabapentin. Patient reports her sugars have been better with today's blood sugar being 223.  Patient gave 02-07-29 day averages from her machine which are better. Patient reports she is doing a better job with medications and her schedule as she is taking care if newborn granddaughter.  Discussed with patient the importance of continuing her diet by limiting carbohydrates and sweets.  She verbalized understanding.    Objective:   Encounter Medications:  Outpatient Encounter Prescriptions as of 12/29/2016  Medication Sig Note  . albuterol (PROVENTIL HFA;VENTOLIN HFA) 108 (90 Base) MCG/ACT inhaler Inhale 2 puffs into the lungs every 6 (six) hours as needed for wheezing or shortness of breath.   . Azelastine-Fluticasone (DYMISTA) 137-50 MCG/ACT SUSP Place 2 sprays into the nose daily.   [name] diltiazem (CARDIZEM CD) 240 MG 24 hr capsule TAKE 1 CAPSULE BY MOUTH EVERY MORNING ON AN EMPTY STOMACH - [name] DENIED, FAXED DR Johnsie Cancel 10/22/15 SS   . DULERA 100-5 MCG/ACT AERO INHALE TWO PUFFS INTO THE LUNGS TWICE DAILY THEN RINSE MOUTH   . gabapentin (NEURONTIN) 300 MG capsule Take 300 mg by mouth 4 (four) times daily. Taking 1 tab in the morning and at noon,  2 capsules in the evening and 4 capsules at bedtime.   [name] HUMULIN R U-500 KWIKPEN 500 UNIT/ML kwikpen  03/18/2016: Received from: External Pharmacy  . ibuprofen (ADVIL,MOTRIN) 800 MG tablet Take 800 mg by mouth 2 (two) times daily.   [name] ipratropium-albuterol (DUONEB) 0.5-2.5 (3) MG/3ML SOLN Take 3 mLs by nebulization every 2 (two) hours as needed (shortness of breath).   [name] levothyroxine (SYNTHROID, LEVOTHROID) 75 MCG tablet Take 75 mcg by mouth daily before  breakfast.   . nebivolol (BYSTOLIC) 10 MG tablet Take 1 tablet (10 mg total) by mouth daily.   . nortriptyline (PAMELOR) 25 MG capsule Take 25 mg by mouth at bedtime.   [name] omeprazole (PRILOSEC) 40 MG capsule Take 40 mg by mouth daily.    [name] tiZANidine (ZANAFLEX) 4 MG tablet  03/18/2016: Received from: External Pharmacy  . Vitamin D, Ergocalciferol, (DRISDOL) 50000 UNITS CAPS Take 50,000 Units by mouth every 7 (seven) days. Takes on Wed   . atorvastatin (LIPITOR) 40 MG tablet Take 40 mg by mouth daily.   . fluticasone-salmeterol (ADVAIR HFA) 115-21 MCG/ACT inhaler Inhale 2 puffs into the lungs 2 (two) times daily. (Patient not taking: Reported on 12/29/2016)   . topiramate (TOPAMAX) 50 MG tablet Take 50 mg by mouth daily. Reported on 02/12/2016 07/04/2016: Patient reports 2 tabs twice a day.   No facility-administered encounter medications on file as of 12/29/2016.     Functional Status:  In your present state of health, do you have any difficulty performing the following activities: 09/09/2016  Hearing? N  Vision? Y  Difficulty concentrating or making decisions? N  Walking or climbing stairs? Y  Dressing or bathing? N  Doing errands, shopping? Y  Preparing Food and eating ? N  Using the Toilet? N  In the past six months, have you accidently leaked urine? N  Do you have problems with loss of bowel control? N  Managing your Medications? Y  Managing your Finances? N  Housekeeping or managing your Housekeeping?  Y  Some recent data might be hidden    Fall/Depression Screening: Fall Risk  12/29/2016 11/25/2016 10/20/2016  Falls in the past year? Yes Yes Yes  Number falls in past yr: - - -  Injury with Fall? - - -  Risk Factor Category  - - -  Risk for fall due to : - - -  Follow up - - -   PHQ 2/9 Scores 12/29/2016 11/25/2016 10/20/2016 09/09/2016 07/04/2016 05/07/2016 04/10/2016  PHQ - 2 Score 0 1 2 6 2  0 0  PHQ- 9 Score - - - 20 7 - -    Assessment: Patient continues to benefit from health coach  outreach for disease management and support.    Plan:  [facility] CM Care Plan Problem One     Most Recent Value  Care Plan Problem One  knowledge deficit related to diabetes type 2  Role Documenting the Problem One  Health [place] for Problem One  Active  THN Long Term Goal   Patient will maintain blood sugar average under 200 within 90 days.  THN Long Term Goal Start Date  12/29/16 [goal continued]  Interventions for Problem One Long Term Goal  RN Health Coach reviewed with patient diabetic diet and maintaining low carbohydrates.       RN Health Coach will contact patient in the month of July and patient agrees to next outreach.  [name], RN, MSN [place] [phone]

## 2016-12-30 ENCOUNTER — Telehealth: Payer: Self-pay | Admitting: Internal Medicine

## 2016-12-30 DIAGNOSIS — G4733 Obstructive sleep apnea (adult) (pediatric): Secondary | ICD-10-CM

## 2016-12-30 NOTE — Telephone Encounter (Signed)
LMTCB- the rx had directions on it!!

## 2016-12-31 DIAGNOSIS — Z6839 Body mass index (BMI) 39.0-39.9, adult: Secondary | ICD-10-CM | POA: Diagnosis not present

## 2016-12-31 DIAGNOSIS — G894 Chronic pain syndrome: Secondary | ICD-10-CM | POA: Diagnosis not present

## 2016-12-31 DIAGNOSIS — E1142 Type 2 diabetes mellitus with diabetic polyneuropathy: Secondary | ICD-10-CM | POA: Diagnosis not present

## 2016-12-31 DIAGNOSIS — E039 Hypothyroidism, unspecified: Secondary | ICD-10-CM | POA: Diagnosis not present

## 2016-12-31 DIAGNOSIS — M797 Fibromyalgia: Secondary | ICD-10-CM | POA: Diagnosis not present

## 2016-12-31 DIAGNOSIS — E669 Obesity, unspecified: Secondary | ICD-10-CM | POA: Diagnosis not present

## 2016-12-31 DIAGNOSIS — Z0000 Encounter for general adult medical examination without abnormal findings: Secondary | ICD-10-CM | POA: Diagnosis not present

## 2016-12-31 DIAGNOSIS — Z794 Long term (current) use of insulin: Secondary | ICD-10-CM | POA: Diagnosis not present

## 2016-12-31 DIAGNOSIS — E1165 Type 2 diabetes mellitus with hyperglycemia: Secondary | ICD-10-CM | POA: Diagnosis not present

## 2016-12-31 MED ORDER — IPRATROPIUM-ALBUTEROL 0.5-2.5 (3) MG/3ML IN SOLN: 3.0000 mL | RESPIRATORY_TRACT | 3 refills | Status: DC | PRN

## 2016-12-31 NOTE — Telephone Encounter (Signed)
lmomtcb x 1 to review results with the pt.

## 2016-12-31 NOTE — Telephone Encounter (Signed)
She has mild obstructive sleep apnea, stopping 6.2 times/ hour.  Weight loss would help. Until she can get her weight down, CPAP may be the best treatment for her, if whe is willing.  Order- DME new CPAP auto 5-15. Mask of choice, humidifier, supplies, AirView   Dx OSA

## 2016-12-31 NOTE — Telephone Encounter (Signed)
Pt returning call to nurse.Cheryl [name]

## 2016-12-31 NOTE — Telephone Encounter (Signed)
Called and spoke with pt and she stated that the duoneb was cancelled by [facility] due to our office not responding to a fax that was sent over to [name].  I have resent the rx in for her.    Pt is requesting the results of her sleep study that was done 5/22.  CY please advise. Thanks

## 2017-01-01 NOTE — Telephone Encounter (Signed)
Order has been placed and I have spoken with the pt and she is aware of orders placed with [facility]>

## 2017-01-05 ENCOUNTER — Telehealth: Payer: Self-pay | Admitting: Internal Medicine

## 2017-01-05 DIAGNOSIS — G4733 Obstructive sleep apnea (adult) (pediatric): Secondary | ICD-10-CM | POA: Insufficient documentation

## 2017-01-05 MED ORDER — IPRATROPIUM-ALBUTEROL 0.5-2.5 (3) MG/3ML IN SOLN: RESPIRATORY_TRACT | 3 refills | Status: DC

## 2017-01-05 NOTE — Telephone Encounter (Signed)
Spoke with the pt  She states wants to switch CPAP order to a DME besides [facility]  She states that she is unable to go to the 2 hour class that they want her to go to  Order sent to [facility]

## 2017-01-05 NOTE — Telephone Encounter (Signed)
CY please advise of the directions of the duoneb.  The last rx that was sent in was for every 2 hours prn---the pharmacy called and stated that this is over the max dose amount for the pt---can we change the sig to read every 4-6 hours as needed?  Thanks

## 2017-01-05 NOTE — Telephone Encounter (Signed)
Rx has been sent to [facility] again. Nothing further was needed.

## 2017-01-05 NOTE — Telephone Encounter (Signed)
We seem to have had a lot of problems with this script. Please change the sig to neb every 4-6 hours if needed

## 2017-01-12 DIAGNOSIS — J45909 Unspecified asthma, uncomplicated: Secondary | ICD-10-CM | POA: Diagnosis not present

## 2017-01-13 ENCOUNTER — Telehealth: Payer: Self-pay | Admitting: Internal Medicine

## 2017-01-13 DIAGNOSIS — G4733 Obstructive sleep apnea (adult) (pediatric): Secondary | ICD-10-CM

## 2017-01-13 NOTE — Telephone Encounter (Signed)
Spoke with pt and she has transportation issues and she was requesting a DME that will bring the machine to her house. Due to the pt's insurance she can only go with [place] or [facility]. [facility] does not offer this type of service. Huey Romans stated that they can mail the cpap to pt with a DVD with how to set up machine and she can call them if she needs further assistance. Pt wanted to go with this option. Spoke with Judeen Hammans who stated a new order would need to be placed. This was done. The pt had no further questions at this time.

## 2017-01-21 DIAGNOSIS — M25532 Pain in left wrist: Secondary | ICD-10-CM | POA: Diagnosis not present

## 2017-01-21 DIAGNOSIS — M654 Radial styloid tenosynovitis [de Quervain]: Secondary | ICD-10-CM | POA: Diagnosis not present

## 2017-01-26 ENCOUNTER — Other Ambulatory Visit: Payer: Self-pay

## 2017-01-26 DIAGNOSIS — G4733 Obstructive sleep apnea (adult) (pediatric): Secondary | ICD-10-CM | POA: Diagnosis not present

## 2017-01-26 NOTE — Patient Outreach (Signed)
[facility]) Care Management  [place]  01/26/2017   CAMILLIA MARCY 1965/12/03 [id number]  Subjective: Telephone call to patient for monthly call.  Patient reports she is doing ok.  She reports that she has some trouble with her left wrist. She reports taking ibuprofen and pain overall is about 5-6/10.  Patient reports that the doctor told her she may need an injection if the pain continues.  Patient reports that she had not been checking her sugar as her machine was out of batteries and she checked her sugar this morning and it was 418.  Patient reports that her sugars have been up and down but mainly in the 100-200 range with some 300's. Discussed with patient importance of eating on schedule and limiting carbohydrates and sweets.  She verbalized understanding.  Patient to get a CPAP machine due to abnormal sleep apnea test.  She states she is just waiting for the machine.  Patient voices no concerns.    Objective:   Encounter Medications:  Outpatient Encounter Prescriptions as of 01/26/2017  Medication Sig Note  . albuterol (PROVENTIL HFA;VENTOLIN HFA) 108 (90 Base) MCG/ACT inhaler Inhale 2 puffs into the lungs every 6 (six) hours as needed for wheezing or shortness of breath.   [name] atorvastatin (LIPITOR) 40 MG tablet Take 40 mg by mouth daily.   [name] buPROPion (WELLBUTRIN) 100 MG tablet Take 100 mg by mouth 2 (two) times daily. 2 tablets twice a day   . diltiazem (CARDIZEM CD) 240 MG 24 hr capsule TAKE 1 CAPSULE BY MOUTH EVERY MORNING ON AN EMPTY STOMACH - [name] DENIED, FAXED DR Johnsie Cancel 10/22/15 SS   . DULERA 100-5 MCG/ACT AERO INHALE TWO PUFFS INTO THE LUNGS TWICE DAILY THEN RINSE MOUTH   . gabapentin (NEURONTIN) 300 MG capsule Take 300 mg by mouth 4 (four) times daily. Taking 1 tab in the morning and at noon,  2 capsules in the evening and 4 capsules at bedtime.   [name] HUMULIN R U-500 KWIKPEN 500 UNIT/ML kwikpen  03/18/2016: Received from: External Pharmacy  . ibuprofen  (ADVIL,MOTRIN) 800 MG tablet Take 800 mg by mouth 2 (two) times daily. 01/26/2017: Taking TID  . ipratropium-albuterol (DUONEB) 0.5-2.5 (3) MG/3ML SOLN Take 4m by nebulization every 4-6 hours if needed   . levothyroxine (SYNTHROID, LEVOTHROID) 75 MCG tablet Take 75 mcg by mouth daily before breakfast.   . naltrexone (DEPADE) 50 MG tablet Take 50 mg by mouth 2 (two) times daily. 1/2 tab in the am, 1/2 tab in the pm   . nebivolol (BYSTOLIC) 10 MG tablet Take 1 tablet (10 mg total) by mouth daily.   . nortriptyline (PAMELOR) 25 MG capsule Take 25 mg by mouth at bedtime.   .Marland Kitchenomeprazole (PRILOSEC) 40 MG capsule Take 40 mg by mouth daily.    . Vitamin D, Ergocalciferol, (DRISDOL) 50000 UNITS CAPS Take 50,000 Units by mouth every 7 (seven) days. Takes on Wed   . Azelastine-Fluticasone (DYMISTA) 137-50 MCG/ACT SUSP Place 2 sprays into the nose daily. (Patient not taking: Reported on 01/26/2017)   . fluticasone-salmeterol (ADVAIR HFA) 115-21 MCG/ACT inhaler Inhale 2 puffs into the lungs 2 (two) times daily. (Patient not taking: Reported on 12/29/2016)   . tiZANidine (ZANAFLEX) 4 MG tablet  03/18/2016: Received from: External Pharmacy  . topiramate (TOPAMAX) 50 MG tablet Take 50 mg by mouth daily. Reported on 02/12/2016 01/26/2017: discontinued   No facility-administered encounter medications on file as of 01/26/2017.     Functional Status:  In your present state of health, do you have any difficulty performing the following activities: 09/09/2016  Hearing? N  Vision? Y  Difficulty concentrating or making decisions? N  Walking or climbing stairs? Y  Dressing or bathing? N  Doing errands, shopping? Y  Preparing Food and eating ? N  Using the Toilet? N  In the past six months, have you accidently leaked urine? N  Do you have problems with loss of bowel control? N  Managing your Medications? Y  Managing your Finances? N  Housekeeping or managing your Housekeeping? Y  Some recent data might be hidden     Fall/Depression Screening: Fall Risk  12/29/2016 11/25/2016 10/20/2016  Falls in the past year? Yes Yes Yes  Number falls in past yr: - - -  Injury with Fall? - - -  Risk Factor Category  - - -  Risk for fall due to : - - -  Follow up - - -   PHQ 2/9 Scores 01/26/2017 12/29/2016 11/25/2016 10/20/2016 09/09/2016 07/04/2016 05/07/2016  PHQ - 2 Score 0 0 1 2 6 2  0  PHQ- 9 Score - - - - 20 7 -    Assessment: Patient continues to benefit from health coach outreach for disease management and support.   Plan:  [facility] CM Care Plan Problem One     Most Recent Value  Care Plan Problem One  knowledge deficit related to diabetes type 2  Role Documenting the Problem One  Health [place] for Problem One  Active  THN Long Term Goal   Patient will maintain blood sugar average under 200 within 90 days.  THN Long Term Goal Start Date  01/26/17 [goal continued]  Interventions for Problem One Long Term Goal  RN Health Coach reiterated with patient diabetic diet and maintaining low carbohydrates.       RN Health Coach will contact patient in the month of August and patient agrees to next outreach.  [name], RN, MSN [place] [phone]

## 2017-02-05 DIAGNOSIS — E1165 Type 2 diabetes mellitus with hyperglycemia: Secondary | ICD-10-CM | POA: Diagnosis not present

## 2017-02-05 DIAGNOSIS — Z6839 Body mass index (BMI) 39.0-39.9, adult: Secondary | ICD-10-CM | POA: Diagnosis not present

## 2017-02-05 DIAGNOSIS — Z794 Long term (current) use of insulin: Secondary | ICD-10-CM | POA: Diagnosis not present

## 2017-02-05 DIAGNOSIS — I10 Essential (primary) hypertension: Secondary | ICD-10-CM | POA: Diagnosis not present

## 2017-02-05 DIAGNOSIS — M797 Fibromyalgia: Secondary | ICD-10-CM | POA: Diagnosis not present

## 2017-02-05 DIAGNOSIS — M25532 Pain in left wrist: Secondary | ICD-10-CM | POA: Diagnosis not present

## 2017-02-05 DIAGNOSIS — M654 Radial styloid tenosynovitis [de Quervain]: Secondary | ICD-10-CM | POA: Diagnosis not present

## 2017-02-05 DIAGNOSIS — R040 Epistaxis: Secondary | ICD-10-CM | POA: Diagnosis not present

## 2017-02-10 ENCOUNTER — Ambulatory Visit: Payer: Medicare HMO | Admitting: Internal Medicine

## 2017-02-11 DIAGNOSIS — J45909 Unspecified asthma, uncomplicated: Secondary | ICD-10-CM | POA: Diagnosis not present

## 2017-02-23 ENCOUNTER — Ambulatory Visit: Payer: Medicare HMO | Admitting: Cardiovascular Disease

## 2017-02-25 ENCOUNTER — Other Ambulatory Visit: Payer: Self-pay

## 2017-02-25 NOTE — Patient Outreach (Signed)
[facility]) Care Management  02/25/2017  Cheryl [name] 09/16/1965 [id number]   Telephone call to patient for monthly call.  No answer.  HIPAA compliant voice message left.  Plan: RN Health Coach will attempt patient again in the month of August.   [place] Blizzard J [name], RN, MSN [place] [phone]

## 2017-02-26 DIAGNOSIS — G4733 Obstructive sleep apnea (adult) (pediatric): Secondary | ICD-10-CM | POA: Diagnosis not present

## 2017-03-12 DIAGNOSIS — E1142 Type 2 diabetes mellitus with diabetic polyneuropathy: Secondary | ICD-10-CM | POA: Diagnosis not present

## 2017-03-12 DIAGNOSIS — E039 Hypothyroidism, unspecified: Secondary | ICD-10-CM | POA: Diagnosis not present

## 2017-03-12 DIAGNOSIS — Z794 Long term (current) use of insulin: Secondary | ICD-10-CM | POA: Diagnosis not present

## 2017-03-12 DIAGNOSIS — E1165 Type 2 diabetes mellitus with hyperglycemia: Secondary | ICD-10-CM | POA: Diagnosis not present

## 2017-03-12 DIAGNOSIS — E782 Mixed hyperlipidemia: Secondary | ICD-10-CM | POA: Diagnosis not present

## 2017-03-14 DIAGNOSIS — J45909 Unspecified asthma, uncomplicated: Secondary | ICD-10-CM | POA: Diagnosis not present

## 2017-03-18 ENCOUNTER — Ambulatory Visit: Payer: Self-pay

## 2017-03-18 ENCOUNTER — Other Ambulatory Visit: Payer: Self-pay

## 2017-03-18 NOTE — Patient Outreach (Signed)
[facility]) Care Management  03/18/2017  CORYNNE SCIBILIA 1966/05/14 [id number]   2nd telephone call to patient for monthly call.  No answer.  HIPAA compliant voice message left.    Plan: RN Health Coach will attempt patient again in the month of August.    Leonetta Mcgivern J Cortlan Dolin, RN, MSN [place] [phone]

## 2017-03-24 ENCOUNTER — Other Ambulatory Visit: Payer: Self-pay

## 2017-03-24 ENCOUNTER — Ambulatory Visit: Payer: Self-pay

## 2017-03-24 NOTE — Patient Outreach (Signed)
[facility]) Care Management  [place]  03/24/2017   Cheryl [name] 31-Jan-1966 [id number]  Subjective: Telephone call to patient for monthly call.  Patient reports feeling tired.  Patient is taking care of newborn granddaughter while her daughter works as well as dealing with her own health issues.  Advised patient surely this is contributing to how she feels and she agrees.  Patient reports that she is also trying to wear her CPAP but she cannot get used to it. Advised patient to try different masks and not to give up. She verbalized understanding. Patient continues to deal with overall pain and is taking ibuprofen.  Patient sugars are a lot better with this mornings being 158.  7,14, and 30 day averages much better.  Patient states that Dr. [name] gave her a continuous glucose monitor to try but states that it came out after the 6th day.  She states she will be taking it back to him.  Encouraged patient to continue to do what she is doing to control her sugars.  She verbalized understanding.   Objective:   Encounter Medications:  Outpatient Encounter Prescriptions as of 03/24/2017  Medication Sig Note  . albuterol (PROVENTIL HFA;VENTOLIN HFA) 108 (90 Base) MCG/ACT inhaler Inhale 2 puffs into the lungs every 6 (six) hours as needed for wheezing or shortness of breath.   [name] buPROPion (WELLBUTRIN) 100 MG tablet Take 100 mg by mouth 2 (two) times daily. 2 tablets twice a day   . diltiazem (CARDIZEM CD) 240 MG 24 hr capsule TAKE 1 CAPSULE BY MOUTH EVERY MORNING ON AN EMPTY STOMACH - [name] DENIED, FAXED DR Johnsie Cancel 10/22/15 SS   . DULERA 100-5 MCG/ACT AERO INHALE TWO PUFFS INTO THE LUNGS TWICE DAILY THEN RINSE MOUTH   . gabapentin (NEURONTIN) 300 MG capsule Take 300 mg by mouth 4 (four) times daily. Taking 1 tab in the morning and at noon,  2 capsules in the evening and 4 capsules at bedtime.   [name] HUMULIN R U-500 KWIKPEN 500 UNIT/ML kwikpen  03/18/2016: Received from: External  Pharmacy  . ipratropium-albuterol (DUONEB) 0.5-2.5 (3) MG/3ML SOLN Take 75m by nebulization every 4-6 hours if needed   . levothyroxine (SYNTHROID, LEVOTHROID) 75 MCG tablet Take 75 mcg by mouth daily before breakfast.   . naltrexone (DEPADE) 50 MG tablet Take 50 mg by mouth 2 (two) times daily. 1/2 tab in the am, 1/2 tab in the pm   . nebivolol (BYSTOLIC) 10 MG tablet Take 1 tablet (10 mg total) by mouth daily.   . nortriptyline (PAMELOR) 25 MG capsule Take 25 mg by mouth at bedtime.   .Marland Kitchenomeprazole (PRILOSEC) 40 MG capsule Take 40 mg by mouth daily.    .Marland KitchentiZANidine (ZANAFLEX) 4 MG tablet  03/18/2016: Received from: External Pharmacy  . atorvastatin (LIPITOR) 40 MG tablet Take 40 mg by mouth daily. 03/24/2017: Patient discontinued  . Azelastine-Fluticasone (DYMISTA) 137-50 MCG/ACT SUSP Place 2 sprays into the nose daily. (Patient not taking: Reported on 01/26/2017)   . fluticasone-salmeterol (ADVAIR HFA) 115-21 MCG/ACT inhaler Inhale 2 puffs into the lungs 2 (two) times daily. (Patient not taking: Reported on 12/29/2016)   . ibuprofen (ADVIL,MOTRIN) 800 MG tablet Take 800 mg by mouth 2 (two) times daily. 01/26/2017: Taking TID  . topiramate (TOPAMAX) 50 MG tablet Take 50 mg by mouth daily. Reported on 02/12/2016 01/26/2017: discontinued  . Vitamin D, Ergocalciferol, (DRISDOL) 50000 UNITS CAPS Take 50,000 Units by mouth every 7 (seven) days. Takes on Wed  No facility-administered encounter medications on file as of 03/24/2017.     Functional Status:  In your present state of health, do you have any difficulty performing the following activities: 09/09/2016  Hearing? N  Vision? Y  Difficulty concentrating or making decisions? N  Walking or climbing stairs? Y  Dressing or bathing? N  Doing errands, shopping? Y  Preparing Food and eating ? N  Using the Toilet? N  In the past six months, have you accidently leaked urine? N  Do you have problems with loss of bowel control? N  Managing your Medications?  Y  Managing your Finances? N  Housekeeping or managing your Housekeeping? Y  Some recent data might be hidden    Fall/Depression Screening: Fall Risk  03/24/2017 12/29/2016 11/25/2016  Falls in the past year? No Yes Yes  Comment - - -  Number falls in past yr: - - -  Injury with Fall? - - -  Risk Factor Category  - - -  Risk for fall due to : - - -  Follow up - - -   PHQ 2/9 Scores 03/24/2017 01/26/2017 12/29/2016 11/25/2016 10/20/2016 09/09/2016 07/04/2016  PHQ - 2 Score 1 0 0 1 2 6 2   PHQ- 9 Score - - - - - 20 7    Assessment: Patient continues to benefit from health coach outreach for disease management and support.    Plan:  [facility] CM Care Plan Problem One     Most Recent Value  Care Plan Problem One  knowledge deficit related to diabetes type 2  Role Documenting the Problem One  Health [place] for Problem One  Active  THN Long Term Goal   Patient will maintain blood sugar average under 200 within 90 days.  THN Long Term Goal Start Date  03/24/17 [goal continued]  Interventions for Problem One Long Term Goal  RN Health Coach reviewed with patient diabetic diet and maintaining low carbohydrates.       RN Health Coach will contact patient in the month of September and patient agrees to next outreach.  [name], RN, MSN [place] [phone]

## 2017-03-29 DIAGNOSIS — G4733 Obstructive sleep apnea (adult) (pediatric): Secondary | ICD-10-CM | POA: Diagnosis not present

## 2017-03-31 ENCOUNTER — Telehealth: Payer: Self-pay | Admitting: Internal Medicine

## 2017-03-31 DIAGNOSIS — G4733 Obstructive sleep apnea (adult) (pediatric): Secondary | ICD-10-CM

## 2017-04-01 NOTE — Telephone Encounter (Signed)
Spoke with pt. States that she is having issues with her CPAP pressure. Reports that she feels like she is suffocating with current pressure. Pt requests that the pressure be lowered.  CY - please advise. Thanks.

## 2017-04-01 NOTE — Telephone Encounter (Signed)
Spoke with pt. She is aware of pressure change. Order has been placed. Nothing further was needed.

## 2017-04-01 NOTE — Telephone Encounter (Signed)
Please order DME [employer] (see notation- phone number) reduce CPAP to auto 5-10,     Dx OSA

## 2017-04-02 DIAGNOSIS — M654 Radial styloid tenosynovitis [de Quervain]: Secondary | ICD-10-CM | POA: Diagnosis not present

## 2017-04-14 DIAGNOSIS — J45909 Unspecified asthma, uncomplicated: Secondary | ICD-10-CM | POA: Diagnosis not present

## 2017-04-17 DIAGNOSIS — E1165 Type 2 diabetes mellitus with hyperglycemia: Secondary | ICD-10-CM | POA: Diagnosis not present

## 2017-04-17 DIAGNOSIS — Z794 Long term (current) use of insulin: Secondary | ICD-10-CM | POA: Diagnosis not present

## 2017-04-20 ENCOUNTER — Telehealth: Payer: Self-pay | Admitting: Internal Medicine

## 2017-04-20 NOTE — Telephone Encounter (Signed)
Spoke with pt, she states she is trying to do better on her CPAP but her pressure was changed recently but feels like it has more pressure. She states her nose is so dry and needs an order to change it, DME company is [employer]. Please advise.   Last note from telephone note regarding this   Deneise Lever, MD  to [facility] Triage Pool     10:43 AM  Note    Please order DME [employer] (see notation- phone number) reduce CPAP to auto 5-10,     Dx OSA        Current Outpatient Prescriptions on File Prior to Visit  Medication Sig Dispense Refill  . albuterol (PROVENTIL HFA;VENTOLIN HFA) 108 (90 Base) MCG/ACT inhaler Inhale 2 puffs into the lungs every 6 (six) hours as needed for wheezing or shortness of breath. 3 Inhaler 3  . atorvastatin (LIPITOR) 40 MG tablet Take 40 mg by mouth daily.    . Azelastine-Fluticasone (DYMISTA) 137-50 MCG/ACT SUSP Place 2 sprays into the nose daily. (Patient not taking: Reported on 01/26/2017) 3 Bottle 1  . buPROPion (WELLBUTRIN) 100 MG tablet Take 100 mg by mouth 2 (two) times daily. 2 tablets twice a day    . diltiazem (CARDIZEM CD) 240 MG 24 hr capsule TAKE 1 CAPSULE BY MOUTH EVERY MORNING ON AN EMPTY STOMACH - [name] DENIED, FAXED DR Johnsie Cancel 10/22/15 SS 30 capsule 5  . DULERA 100-5 MCG/ACT AERO INHALE TWO PUFFS INTO THE LUNGS TWICE DAILY THEN RINSE MOUTH 1 g 3  . fluticasone-salmeterol (ADVAIR HFA) 115-21 MCG/ACT inhaler Inhale 2 puffs into the lungs 2 (two) times daily. (Patient not taking: Reported on 12/29/2016) 3 Inhaler 3  . gabapentin (NEURONTIN) 300 MG capsule Take 300 mg by mouth 4 (four) times daily. Taking 1 tab in the morning and at noon,  2 capsules in the evening and 4 capsules at bedtime.    [name] HUMULIN R U-500 KWIKPEN 500 UNIT/ML kwikpen     . ibuprofen (ADVIL,MOTRIN) 800 MG tablet Take 800 mg by mouth 2 (two) times daily.    [name] ipratropium-albuterol (DUONEB) 0.5-2.5 (3) MG/3ML SOLN Take 52m by nebulization every 4-6 hours if needed 1080 mL 3  .  levothyroxine (SYNTHROID, LEVOTHROID) 75 MCG tablet Take 75 mcg by mouth daily before breakfast.    . naltrexone (DEPADE) 50 MG tablet Take 50 mg by mouth 2 (two) times daily. 1/2 tab in the am, 1/2 tab in the pm    . nebivolol (BYSTOLIC) 10 MG tablet Take 1 tablet (10 mg total) by mouth daily. 90 tablet 3  . nortriptyline (PAMELOR) 25 MG capsule Take 25 mg by mouth at bedtime.    .Marland Kitchenomeprazole (PRILOSEC) 40 MG capsule Take 40 mg by mouth daily.     .Marland KitchentiZANidine (ZANAFLEX) 4 MG tablet     . topiramate (TOPAMAX) 50 MG tablet Take 50 mg by mouth daily. Reported on 02/12/2016    . Vitamin D, Ergocalciferol, (DRISDOL) 50000 UNITS CAPS Take 50,000 Units by mouth every 7 (seven) days. Takes on Wed     No current facility-administered medications on file prior to visit.    Allergies  Allergen Reactions  . Hydromorphone Hcl Itching and Rash    redness  . Influenza Vaccines Anaphylaxis and Swelling    Throat swelling, "flu" symptoms  . Iodinated Diagnostic Agents Shortness Of Breath, Nausea And Vomiting, Nausea Only and Rash  . Iodine-131 Shortness Of Breath, Nausea And Vomiting and Rash  . Iohexol Anaphylaxis,  Hives and Swelling     Desc: hives,dyspnea; throat swelling; ok w/ premeds and omnipaque   . Levofloxacin Anaphylaxis and Hives  . Nucynta [Tapentadol Hydrochloride] Other (See Comments)    Hallucinations and  insomnia x3 days  . Pregabalin Other (See Comments)    Reaction-Syncope & unable to speak  . Sulfonamide Derivatives Anaphylaxis  . Oxycodone-Acetaminophen Rash  . Vilazodone Hcl Other (See Comments)    Hallucinations  . Amoxicillin Hives  . Biaxin [Clarithromycin] Hives  . Carbamazepine Itching  . Ciprofloxacin Hives  . Dilaudid [Hydromorphone Hcl] Itching and Rash    redness  . Iodides Nausea Only  . Percocet [Oxycodone-Acetaminophen] Rash  . Tapentadol Other (See Comments)    unknown

## 2017-04-20 NOTE — Telephone Encounter (Signed)
Suggest she use otc nasal saline gel for dry nose as needed, and ask DME company for help with humidifier on CPAP. Shje may want to try a different mask.

## 2017-04-20 NOTE — Telephone Encounter (Signed)
Spoke with patient. She stated that she will try the nasal saline gel for her nose. Nothing else needed at time of call.

## 2017-04-21 ENCOUNTER — Other Ambulatory Visit: Payer: Self-pay

## 2017-04-21 NOTE — Patient Outreach (Signed)
[facility]) Care Management  04/21/2017  Roxy Mastandrea Sabina Aug 15, 1965 [id number]   Telephone call to patient for monthly call. No answer.  HIPAA compliant voice message left.    Plan: RN Health Coach will contact patient again in the month of September.    [name], RN, MSN [place] [phone]

## 2017-04-22 ENCOUNTER — Other Ambulatory Visit: Payer: Self-pay

## 2017-04-22 NOTE — Patient Outreach (Signed)
[facility]) Care Management  04/22/2017  Leniyah Martell Tuley [birth year] [id number]   2nd telephone call to patient for monthly call.  No answer.  HIPAA compliant voice message left.   Plan: RN Health Coach will attempt patient again in the month of October.   [name], RN, MSN [place] [phone]

## 2017-04-22 NOTE — Patient Outreach (Signed)
[facility]) Care Management  [place]  04/22/2017   Cheryl [name] 1965/11/28 [id number]  Subjective: Incoming call from patient.  She reports she is doing ok but having some increased pain. Patient speaks of pain in multiple areas but is taking Zanaflex and ibuprofen. Patient to see the primary care physician soon and will be discussing that. Patient reports that she also had some questionable heart flutters but she is not sure if it is her anxiety.  Advised patient that if heart flutters continue she will need to have it checked if that is what she feels it is.  She verbalized understanding.  Patient reports that her sugars are kind all over the place with readings at [phone], and 186.  Patient admits that sometimes she does not take her insulin when she does not eat.  Advised patient that her sugars will be erratic when she does not eat and take her insulin properly.  Encouraged patient to take her medications and eat regularly. She verbalized understanding. Patient reports she has had some increased left leg swelling but has seen it worse.  Advised patient to elevate her legs when she can and if it worsens to notify her doctor.  She verbalized understanding.     Objective:   Encounter Medications:  Outpatient Encounter Prescriptions as of 04/22/2017  Medication Sig Note  . albuterol (PROVENTIL HFA;VENTOLIN HFA) 108 (90 Base) MCG/ACT inhaler Inhale 2 puffs into the lungs every 6 (six) hours as needed for wheezing or shortness of breath.   [name] buPROPion (WELLBUTRIN) 100 MG tablet Take 100 mg by mouth 2 (two) times daily. 2 tablets twice a day   . Cholecalciferol (VITAMIN D3) 1000 units CAPS Take 1,000 Units by mouth daily.   [name] diltiazem (CARDIZEM CD) 240 MG 24 hr capsule TAKE 1 CAPSULE BY MOUTH EVERY MORNING ON AN EMPTY STOMACH - [name] DENIED, FAXED DR Johnsie Cancel 10/22/15 SS   . DULERA 100-5 MCG/ACT AERO INHALE TWO PUFFS INTO THE LUNGS TWICE DAILY THEN RINSE MOUTH    . gabapentin (NEURONTIN) 300 MG capsule Take 300 mg by mouth 4 (four) times daily. Taking 1 tab in the morning and at noon,  2 capsules in the evening and 4 capsules at bedtime.   [name] HUMULIN R U-500 KWIKPEN 500 UNIT/ML kwikpen  03/18/2016: Received from: External Pharmacy  . ibuprofen (ADVIL,MOTRIN) 800 MG tablet Take 800 mg by mouth 2 (two) times daily. 01/26/2017: Taking TID  . ipratropium-albuterol (DUONEB) 0.5-2.5 (3) MG/3ML SOLN Take 38m by nebulization every 4-6 hours if needed   . levothyroxine (SYNTHROID, LEVOTHROID) 75 MCG tablet Take 75 mcg by mouth daily before breakfast.   . naltrexone (DEPADE) 50 MG tablet Take 50 mg by mouth 2 (two) times daily. 1/2 tab in the am, 1/2 tab in the pm   . nebivolol (BYSTOLIC) 10 MG tablet Take 1 tablet (10 mg total) by mouth daily.   . nortriptyline (PAMELOR) 25 MG capsule Take 25 mg by mouth at bedtime.   .Marland Kitchenomeprazole (PRILOSEC) 40 MG capsule Take 40 mg by mouth daily.    .Marland KitchentiZANidine (ZANAFLEX) 4 MG tablet  03/18/2016: Received from: External Pharmacy  . atorvastatin (LIPITOR) 40 MG tablet Take 40 mg by mouth daily. 03/24/2017: Patient discontinued  . Azelastine-Fluticasone (DYMISTA) 137-50 MCG/ACT SUSP Place 2 sprays into the nose daily. (Patient not taking: Reported on 01/26/2017)   . fluticasone-salmeterol (ADVAIR HFA) 115-21 MCG/ACT inhaler Inhale 2 puffs into the lungs 2 (two) times daily. (Patient not  taking: Reported on 12/29/2016)   . topiramate (TOPAMAX) 50 MG tablet Take 50 mg by mouth daily. Reported on 02/12/2016 01/26/2017: discontinued  . Vitamin D, Ergocalciferol, (DRISDOL) 50000 UNITS CAPS Take 50,000 Units by mouth every 7 (seven) days. Takes on Wed 04/22/2017: DISCONTINUED   No facility-administered encounter medications on file as of 04/22/2017.     Functional Status:  In your present state of health, do you have any difficulty performing the following activities: 09/09/2016  Hearing? N  Vision? Y  Difficulty concentrating or making  decisions? N  Walking or climbing stairs? Y  Dressing or bathing? N  Doing errands, shopping? Y  Preparing Food and eating ? N  Using the Toilet? N  In the past six months, have you accidently leaked urine? N  Do you have problems with loss of bowel control? N  Managing your Medications? Y  Managing your Finances? N  Housekeeping or managing your Housekeeping? Y  Some recent data might be hidden    Fall/Depression Screening: Fall Risk  03/24/2017 12/29/2016 11/25/2016  Falls in the past year? No Yes Yes  Comment - - -  Number falls in past yr: - - -  Injury with Fall? - - -  Risk Factor Category  - - -  Risk for fall due to : - - -  Follow up - - -   PHQ 2/9 Scores 03/24/2017 01/26/2017 12/29/2016 11/25/2016 10/20/2016 09/09/2016 07/04/2016  PHQ - 2 Score 1 0 0 1 2 6 2   PHQ- 9 Score - - - - - 20 7    Assessment: Patient continues to benefit from health coach outreach for disease management and support.    Plan:  [facility] CM Care Plan Problem One     Most Recent Value  Care Plan Problem One  knowledge deficit related to diabetes type 2  Role Documenting the Problem One  Health [place] for Problem One  Active  THN Long Term Goal   Patient will maintain blood sugar average under 200 within 90 days.  THN Long Term Goal Start Date  04/22/17 [goal continued]  Interventions for Problem One Long Term Goal  RN Health Coach reiterated with patient diabetic diet and maintaining low carbohydrates.       RN Health Coach will contact patient in the month of October and patient agrees to next outreach.

## 2017-04-23 DIAGNOSIS — M654 Radial styloid tenosynovitis [de Quervain]: Secondary | ICD-10-CM | POA: Diagnosis not present

## 2017-04-28 DIAGNOSIS — G4733 Obstructive sleep apnea (adult) (pediatric): Secondary | ICD-10-CM | POA: Diagnosis not present

## 2017-04-30 ENCOUNTER — Ambulatory Visit: Payer: Medicare HMO | Admitting: Internal Medicine

## 2017-05-04 DIAGNOSIS — M654 Radial styloid tenosynovitis [de Quervain]: Secondary | ICD-10-CM | POA: Diagnosis not present

## 2017-05-07 ENCOUNTER — Other Ambulatory Visit: Payer: Self-pay

## 2017-05-07 NOTE — Patient Outreach (Signed)
[place] [facility]) Care Management  05/07/2017  Cheryl [name] 06/23/1966 [id number]   Telephone call to patient for monthly call. No answer.  HIPAA compliant voice message left.  Plan: RN CM will attempt patient again in the month of October.   [name], RN, MSN [facility] Care Management Care Management Telephonic Coordinator Direct Line [phone] Toll Free: [phone]  Fax: [phone]

## 2017-05-11 DIAGNOSIS — E669 Obesity, unspecified: Secondary | ICD-10-CM | POA: Diagnosis not present

## 2017-05-11 DIAGNOSIS — M797 Fibromyalgia: Secondary | ICD-10-CM | POA: Diagnosis not present

## 2017-05-11 DIAGNOSIS — M545 Low back pain: Secondary | ICD-10-CM | POA: Diagnosis not present

## 2017-05-11 DIAGNOSIS — I10 Essential (primary) hypertension: Secondary | ICD-10-CM | POA: Diagnosis not present

## 2017-05-11 DIAGNOSIS — M25551 Pain in right hip: Secondary | ICD-10-CM | POA: Diagnosis not present

## 2017-05-12 DIAGNOSIS — M545 Low back pain: Secondary | ICD-10-CM | POA: Diagnosis not present

## 2017-05-12 DIAGNOSIS — I10 Essential (primary) hypertension: Secondary | ICD-10-CM | POA: Diagnosis not present

## 2017-05-12 DIAGNOSIS — M797 Fibromyalgia: Secondary | ICD-10-CM | POA: Diagnosis not present

## 2017-05-12 DIAGNOSIS — E669 Obesity, unspecified: Secondary | ICD-10-CM | POA: Diagnosis not present

## 2017-05-12 DIAGNOSIS — M25551 Pain in right hip: Secondary | ICD-10-CM | POA: Diagnosis not present

## 2017-05-14 DIAGNOSIS — J45909 Unspecified asthma, uncomplicated: Secondary | ICD-10-CM | POA: Diagnosis not present

## 2017-05-18 ENCOUNTER — Other Ambulatory Visit: Payer: Self-pay

## 2017-05-18 NOTE — Patient Outreach (Addendum)
[place] [facility]) Care Management  [place]  05/18/2017   IFEOMA VALLIN 16-Aug-1965 [id number]  Subjective: Telephone call to patient for monthly call.  Patient reports she is doing pretty good.  Patient reports that she has some back pain today and rates it at an 8/10.  Patient continues to take Zanaflex and ibuprofen.  Patient reports some lightheadedness at times and heart flutters at times.  Discussed with patient heart flutters and lightheadedness.  Advised patient that she may need to see the cardiologist and she agrees.  Patient reports her sugars are all over the place with today's sugar being 200.  Patient not taking insulin regularly urged patient to take her insulin regularly to control her sugars better. She verbalized understanding.   Objective:   Encounter Medications:  Outpatient Encounter Prescriptions as of 05/18/2017  Medication Sig Note  . albuterol (PROVENTIL HFA;VENTOLIN HFA) 108 (90 Base) MCG/ACT inhaler Inhale 2 puffs into the lungs every 6 (six) hours as needed for wheezing or shortness of breath.   [name] buPROPion (WELLBUTRIN) 100 MG tablet Take 100 mg by mouth 2 (two) times daily. 2 tablets twice a day   . Cholecalciferol (VITAMIN D3) 1000 units CAPS Take 1,000 Units by mouth daily.   [name] diltiazem (CARDIZEM CD) 240 MG 24 hr capsule TAKE 1 CAPSULE BY MOUTH EVERY MORNING ON AN EMPTY STOMACH - [name] DENIED, FAXED DR Johnsie Cancel 10/22/15 SS   . DULERA 100-5 MCG/ACT AERO INHALE TWO PUFFS INTO THE LUNGS TWICE DAILY THEN RINSE MOUTH   . gabapentin (NEURONTIN) 300 MG capsule Take 300 mg by mouth 4 (four) times daily. Taking 1 tab in the morning and at noon,  2 capsules in the evening and 4 capsules at bedtime.   [name] HUMULIN R U-500 KWIKPEN 500 UNIT/ML kwikpen  03/18/2016: Received from: External Pharmacy  . hydrochlorothiazide (HYDRODIURIL) 25 MG tablet Take 25 mg by mouth daily.   [name] ibuprofen (ADVIL,MOTRIN) 800 MG tablet Take 800 mg by mouth 2 (two) times  daily. 01/26/2017: Taking TID  . ipratropium-albuterol (DUONEB) 0.5-2.5 (3) MG/3ML SOLN Take 43m by nebulization every 4-6 hours if needed   . levothyroxine (SYNTHROID, LEVOTHROID) 75 MCG tablet Take 75 mcg by mouth daily before breakfast.   . naltrexone (DEPADE) 50 MG tablet Take 50 mg by mouth 2 (two) times daily. 1/2 tab in the am, 1/2 tab in the pm   . nebivolol (BYSTOLIC) 10 MG tablet Take 1 tablet (10 mg total) by mouth daily.   . nortriptyline (PAMELOR) 25 MG capsule Take 25 mg by mouth at bedtime.   .Marland Kitchenomeprazole (PRILOSEC) 40 MG capsule Take 40 mg by mouth daily.    .Marland KitchentiZANidine (ZANAFLEX) 4 MG tablet  03/18/2016: Received from: External Pharmacy  . atorvastatin (LIPITOR) 40 MG tablet Take 40 mg by mouth daily. 03/24/2017: Patient discontinued  . Azelastine-Fluticasone (DYMISTA) 137-50 MCG/ACT SUSP Place 2 sprays into the nose daily. (Patient not taking: Reported on 01/26/2017)   . fluticasone-salmeterol (ADVAIR HFA) 115-21 MCG/ACT inhaler Inhale 2 puffs into the lungs 2 (two) times daily. (Patient not taking: Reported on 12/29/2016)   . topiramate (TOPAMAX) 50 MG tablet Take 50 mg by mouth daily. Reported on 02/12/2016 01/26/2017: discontinued  . Vitamin D, Ergocalciferol, (DRISDOL) 50000 UNITS CAPS Take 50,000 Units by mouth every 7 (seven) days. Takes on Wed 04/22/2017: DISCONTINUED   No facility-administered encounter medications on file as of 05/18/2017.     Functional Status:  In your present state of health, do  you have any difficulty performing the following activities: 09/09/2016  Hearing? N  Vision? Y  Difficulty concentrating or making decisions? N  Walking or climbing stairs? Y  Dressing or bathing? N  Doing errands, shopping? Y  Preparing Food and eating ? N  Using the Toilet? N  In the past six months, have you accidently leaked urine? N  Do you have problems with loss of bowel control? N  Managing your Medications? Y  Managing your Finances? N  Housekeeping or managing your  Housekeeping? Y  Some recent data might be hidden    Fall/Depression Screening: Fall Risk  05/18/2017 03/24/2017 12/29/2016  Falls in the past year? No No Yes  Comment - - -  Number falls in past yr: - - -  Injury with Fall? - - -  Risk Factor Category  - - -  Risk for fall due to : - - -  Follow up - - -   PHQ 2/9 Scores 05/18/2017 03/24/2017 01/26/2017 12/29/2016 11/25/2016 10/20/2016 09/09/2016  PHQ - 2 Score 1 1 0 0 1 2 6   PHQ- 9 Score - - - - - - 20    Assessment: Patient continues to benefit from care manager outreach for disease management and support.    Plan:  [facility] CM Care Plan Problem One     Most Recent Value  Care Plan Problem One  knowledge deficit related to diabetes type 2  Role Documenting the Problem One  [facility] for Problem One  Active  THN Long Term Goal   Patient will maintain blood sugar average under 200 within 90 days.  THN Long Term Goal Start Date  05/18/17 [goal continued]  Interventions for Problem One Long Term Goal  RN Health Coach reinforced with patient diabetic diet and maintaining low carbohydrates.       RN CM will contact patient in the month of November and patient agrees to next outreach.  [name], RN, MSN [facility] Care Management Care Management Coordinator Direct Line [phone] Toll Free: [phone]  Fax: [phone]

## 2017-05-29 DIAGNOSIS — G4733 Obstructive sleep apnea (adult) (pediatric): Secondary | ICD-10-CM | POA: Diagnosis not present

## 2017-06-04 DIAGNOSIS — F418 Other specified anxiety disorders: Secondary | ICD-10-CM | POA: Diagnosis not present

## 2017-06-04 DIAGNOSIS — E1142 Type 2 diabetes mellitus with diabetic polyneuropathy: Secondary | ICD-10-CM | POA: Diagnosis not present

## 2017-06-04 DIAGNOSIS — M546 Pain in thoracic spine: Secondary | ICD-10-CM | POA: Diagnosis not present

## 2017-06-04 DIAGNOSIS — M797 Fibromyalgia: Secondary | ICD-10-CM | POA: Diagnosis not present

## 2017-06-04 DIAGNOSIS — H9201 Otalgia, right ear: Secondary | ICD-10-CM | POA: Diagnosis not present

## 2017-06-04 DIAGNOSIS — M25511 Pain in right shoulder: Secondary | ICD-10-CM | POA: Diagnosis not present

## 2017-06-04 DIAGNOSIS — G894 Chronic pain syndrome: Secondary | ICD-10-CM | POA: Diagnosis not present

## 2017-06-04 DIAGNOSIS — Z6841 Body Mass Index (BMI) 40.0 and over, adult: Secondary | ICD-10-CM | POA: Diagnosis not present

## 2017-06-14 DIAGNOSIS — J45909 Unspecified asthma, uncomplicated: Secondary | ICD-10-CM | POA: Diagnosis not present

## 2017-06-15 ENCOUNTER — Other Ambulatory Visit: Payer: Self-pay

## 2017-06-15 NOTE — Patient Outreach (Signed)
[place] [facility]) Care Management  06/15/2017  Cheryl [name] 08/15/1965 [id number]   Telephone call to patient for monthly outreach.  No answer. HIPAA compliant voice message left.   Plan: RN CM will attempt patient again in the month of November.   [name], RN, MSN [facility] Care Management Care Management Coordinator Direct Line [phone] Toll Free: [phone]  Fax: [phone]

## 2017-06-17 ENCOUNTER — Telehealth: Payer: Self-pay | Admitting: Cardiovascular Disease

## 2017-06-17 NOTE — Telephone Encounter (Signed)
Pt c/o of Chest Pain: 1. Are you having CP right now? Yes 2. Are you experiencing any other symptoms (ex. SOB, nausea, vomiting, sweating)? Shortness of Breath 3. How long have you been experiencing CP? Month  4. Is your CP continuous or coming and going? Contiuous  5. Have you taken Nitroglycerin? No . Have been taking Aspirin

## 2017-06-17 NOTE — Telephone Encounter (Signed)
Patient complaining of non-radiating intermittent chest pain x 1 month. She states that it is in the middle of her chest and that it gets worse when she takes a deep breath. She states that her pain does not correlate with any type of activity and that it can happen at rest. She states that she does get a little SOB with activity, but this is nothing new for her. She denies having any other symptoms. She states that she started using CPAP about a month ago and she is not sure if this is the cause. BP was 138/87 HR 87. She states that she has been taking her meds as directed. She also states that she has been taking a baby ASA. No Rx for NTG. She states that it is not bad enough to go to the ER, that she was just wanting to schedule an appointment with Dr. Johnsie Cancel. Made patient aware Dr. Kyla Balzarine next available appointment is not until January. Offered to schedule her with APP on his team to have her evaluated sooner. Patient states that her and her family are getting ready to leave and she wants to call back at a later time to schedule with APP.

## 2017-06-22 ENCOUNTER — Encounter: Payer: Self-pay | Admitting: Cardiology

## 2017-06-22 ENCOUNTER — Ambulatory Visit: Payer: Medicare HMO | Admitting: Cardiology

## 2017-06-22 VITALS — BP 112/78 | HR 85 | Ht 62.0 in | Wt 219.8 lb

## 2017-06-22 DIAGNOSIS — R079 Chest pain, unspecified: Secondary | ICD-10-CM | POA: Diagnosis not present

## 2017-06-22 NOTE — Patient Instructions (Addendum)
Medication Instructions:  Your physician recommends that you continue on your current medications as directed. Please refer to the Current Medication list given to you today.  Labwork: NONE  Testing/Procedures: Your physician has requested that you have en exercise stress myoview. For further information please visit [email or web address]. Please follow instruction sheet, as given.  Follow-Up: Your physician recommends that you schedule a follow-up appointment in a few weeks with a NP or PA on a day Dr. Johnsie Cancel is in the office after your test is completed.  If you need a refill on your cardiac medications before your next appointment, please call your pharmacy.

## 2017-06-22 NOTE — Progress Notes (Signed)
06/22/2017 Cheryl [name]   Apr 26, 1966  [id number]  Primary Physician Glenford Bayley, DO Primary Cardiologist: Dr. Johnsie Cancel   Reason for Visit/CC: Chest Pain   HPI:  Cheryl [name] is a 51 y.o. female who is being seen today for the evaluation of Chest Pain. She is followed by Dr. Johnsie Cancel.  Cardiac risk factors include poorly controlled IDDM. Pt notes her blood sugars recently have been in the 300s, despite compliance with insulin regimen. Also h/o HLD but patient admits that she self discontinued her Lipitor last year, because it "saved her $7.00". She also has HTN that is treated as well as a family h/o premature CAD (father w/ MI in early 98s). She denies recent tobacco use. She is a former smoker.  She has known coronary artery calcifications, noted on previous chest CT. Mild calcification of distal LM/ and LAD noted. Echo in 2016 showed normal LVEF and no significant valvular disease.   She has a h/o atypical chest pain and has been evaluated by Dr. Johnsie Cancel. Her CP in 2016 was felt to be musculoskeletal, described as sharp pain underneath left breast. Pleuritic and positional. Symptoms were not c/w cardiac chest pain. Continued medical therapy and risk factor reduction recommended. Pt last seen by Dr. Johnsie Cancel 10/2016.   Her recent CP is mixed typical and atypical features. She notes intermittent SSCP radiating to her back. Also with occasional anterior neck pain that occurs if she is feeling anxious of if she eats too fast. She has to drink plenty of water to get food down. She states she had an esophageal motility study in the past and was told everything was normal. She also notes occasional dyspnea when symptoms occur. CP sometimes is worse with exertion but "not always", per pt. She has occasional relief when she takes ASA. Episodes happen ~2 times a week. She is currently CP free. EKG shows NSR. BP is well controlled.     Current Meds  Medication Sig  . albuterol (PROVENTIL HFA;VENTOLIN  HFA) 108 (90 Base) MCG/ACT inhaler Inhale 2 puffs into the lungs every 6 (six) hours as needed for wheezing or shortness of breath.  . Azelastine-Fluticasone (DYMISTA) 137-50 MCG/ACT SUSP Place 2 sprays into the nose daily.  . Cholecalciferol (VITAMIN D3) 1000 units CAPS Take 1,000 Units by mouth daily.  [name] diltiazem (CARDIZEM CD) 240 MG 24 hr capsule TAKE 1 CAPSULE BY MOUTH EVERY MORNING ON AN EMPTY STOMACH - [name] DENIED, FAXED DR Johnsie Cancel 10/22/15 SS  . DULERA 100-5 MCG/ACT AERO INHALE TWO PUFFS INTO THE LUNGS TWICE DAILY THEN RINSE MOUTH  . fluticasone-salmeterol (ADVAIR HFA) 115-21 MCG/ACT inhaler Inhale 2 puffs into the lungs 2 (two) times daily.  [name] gabapentin (NEURONTIN) 300 MG capsule Take 300 mg by mouth 4 (four) times daily. Taking 1 tab in the morning and at noon,  2 capsules in the evening and 4 capsules at bedtime.  [name] HUMULIN R U-500 KWIKPEN 500 UNIT/ML kwikpen   . hydrochlorothiazide (HYDRODIURIL) 25 MG tablet Take 25 mg by mouth daily.  [name] ibuprofen (ADVIL,MOTRIN) 800 MG tablet Take 800 mg by mouth 2 (two) times daily.  [name] ipratropium-albuterol (DUONEB) 0.5-2.5 (3) MG/3ML SOLN Take 31m by nebulization every 4-6 hours if needed  . levothyroxine (SYNTHROID, LEVOTHROID) 75 MCG tablet Take 75 mcg by mouth daily before breakfast.  . naltrexone (DEPADE) 50 MG tablet Take 50 mg by mouth 2 (two) times daily. 1/2 tab in the am, 1/2 tab in the pm  . nebivolol (BYSTOLIC)  10 MG tablet Take 1 tablet (10 mg total) by mouth daily.  . nortriptyline (PAMELOR) 25 MG capsule Take 25 mg by mouth at bedtime.  [name] omeprazole (PRILOSEC) 40 MG capsule Take 40 mg by mouth daily.   [name] tiZANidine (ZANAFLEX) 4 MG tablet    Allergies  Allergen Reactions  . Hydromorphone Hcl Itching and Rash    redness  . Influenza Vaccines Anaphylaxis and Swelling    Throat swelling, "flu" symptoms  . Iodinated Diagnostic Agents Shortness Of Breath, Nausea And Vomiting, Nausea Only and Rash  . Iodine-131 Shortness Of Breath,  Nausea And Vomiting and Rash  . Iohexol Anaphylaxis, Hives and Swelling     Desc: hives,dyspnea; throat swelling; ok w/ premeds and omnipaque   . Levofloxacin Anaphylaxis and Hives  . Nucynta [Tapentadol Hydrochloride] Other (See Comments)    Hallucinations and  insomnia x3 days  . Pregabalin Other (See Comments)    Reaction-Syncope & unable to speak  . Sulfonamide Derivatives Anaphylaxis  . Oxycodone-Acetaminophen Rash  . Vilazodone Hcl Other (See Comments)    Hallucinations  . Amoxicillin Hives  . Biaxin [Clarithromycin] Hives  . Carbamazepine Itching  . Ciprofloxacin Hives  . Dilaudid [Hydromorphone Hcl] Itching and Rash    redness  . Iodides Nausea Only  . Percocet [Oxycodone-Acetaminophen] Rash  . Tapentadol Other (See Comments)    unknown   Past Medical History:  Diagnosis Date  . Anxiety   . Asthma   . Chronic airway obstruction, not elsewhere classified   . Chronic pain    "q where; herniated disc in my tailbone" (02/09/2013)  . DDD (degenerative disc disease)    CERVIAL AND LUMBAR  . Depression   . Edema   . Fatty liver disease, nonalcoholic   . Fibromyalgia    "severe" (02/09/2013)  . Gastroparesis    from DM and chronic narcotic use  . GERD (gastroesophageal reflux disease)   . Human parvovirus infection   . Hyperlipidemia   . Hypertension   . Hypothyroidism   . Interstitial cystitis   . Migraine headache    "weekly; worse lately" (02/09/2013)  . OSA (obstructive sleep apnea)    "have mask;; don't use it; no one came out to check it" (02/09/2013)  . Peripheral neuropathy    "severe" (02/09/2013)  . 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 PONV (postoperative nausea and vomiting)   . Positive PPD   . Shortness of breath    "at anytime; it's gotten worse recently" (02/09/2013)  . Type II diabetes mellitus ([place])   . Vitamin D deficiency   . Vocal cord dysfunction    Family History  Problem Relation Age of  Onset  . Coronary artery disease Father    Past Surgical History:  Procedure Laterality Date  . ABDOMINAL HYSTERECTOMY  1993  . ANTERIOR CERVICAL DECOMP/DISCECTOMY FUSION  2004  . CARPAL TUNNEL RELEASE Bilateral ?1980's  . CHOLECYSTECTOMY  ?1990  . HIP SURGERY Right 2002   "did something to the tibula band" (02/09/2013)  . KNEE ARTHROSCOPY Left 1990's   "fell; clot behind knee cap had to be removed" (02/09/2013)  . NASAL SINUS SURGERY  1986; 1990's   "i've had 3" (02/09/2013)  . SHOULDER ARTHROSCOPY W/ ROTATOR CUFF REPAIR Right    "had to go deep in rotator cuff" (02/09/2013)   Social History   Socioeconomic History  . Marital status: Legally Separated    Spouse name: Not on file  . Number  of children: Not on file  . Years of education: Not on file  . Highest education level: Not on file  Social Needs  . Financial resource strain: Not on file  . Food insecurity - worry: Not on file  . Food insecurity - inability: Not on file  . Transportation needs - medical: Not on file  . Transportation needs - non-medical: Not on file  Occupational History  . Not on file  Tobacco Use  . Smoking status: Former Smoker    Packs/day: 0.10    Years: 3.00    Pack years: 0.30    Types: Cigarettes    Last attempt to quit: 07/28/2005    Years since quitting: 11.9  . Smokeless tobacco: Never Used  Substance and Sexual Activity  . Alcohol use: No  . Drug use: No  . Sexual activity: No  Other Topics Concern  . Not on file  Social History Narrative   Lives with daughter in a one story home.  On disability.  Education: college.       Review of Systems: General: negative for chills, fever, night sweats or weight changes.  Cardiovascular: negative for chest pain, dyspnea on exertion, edema, orthopnea, palpitations, paroxysmal nocturnal dyspnea or shortness of breath Dermatological: negative for rash Respiratory: negative for cough or wheezing Urologic: negative for hematuria Abdominal:  negative for nausea, vomiting, diarrhea, bright red blood per rectum, melena, or hematemesis Neurologic: negative for visual changes, syncope, or dizziness All other systems reviewed and are otherwise negative except as noted above.   Physical Exam:  Blood pressure 112/78, pulse 85, height 5' 2"  (1.575 m), weight 219 lb 12 oz (99.7 kg), SpO2 98 %.  General appearance: alert, cooperative and no distress Neck: no carotid bruit and no JVD Lungs: clear to auscultation bilaterally Heart: regular rate and rhythm, S1, S2 normal, no murmur, click, rub or gallop Extremities: extremities normal, atraumatic, no cyanosis or edema Pulses: 2+ and symmetric Skin: Skin color, texture, turgor normal. No rashes or lesions Neurologic: Grossly normal  EKG NSR no ischemia -- personally reviewed   ASSESSMENT AND PLAN:   1. Chest Pain: mixed typical and atypical features as outlined above in HPI. She had mild coronary artery calcifications noted on chest CT in 2016. She has multiple other cardiac risk factors including poorly controlled IDDM, untreated HLD, HTN and family h/o premature CAD. She is currently CP free. EKG non ischemic. Given her risk factors, we will plan on nuclear stress test to assess for ischemia. We discussed options. Pt would like to try exercise Myoview, however if unable to reach target HR, can change to [place]. We will continue current medication regimen, however pt was encouraged to restart Lipitor for cholesterol (self discontinued 1 year ago. No side effects. Just stopped). F/u after stress test to review results and determine if she will need additional testing.    Cheryl [name], MHS [facility] 06/22/2017 10:51 AM

## 2017-06-23 ENCOUNTER — Other Ambulatory Visit: Payer: Self-pay

## 2017-06-23 NOTE — Patient Outreach (Signed)
[facility]) Care Management  06/23/2017  Cheryl [name] 08/22/1965 [id number]   2nd telephone call to patient for monthly call.  No answer.  HIPAA compliant voice message left.  Plan: RN CM will attempt patient again in the month of November.  [name], RN, MSN [facility] Care Management Care Management Coordinator Direct Line [phone] Toll Free: [phone]  Fax: [phone]

## 2017-06-24 ENCOUNTER — Ambulatory Visit: Payer: Self-pay

## 2017-06-25 ENCOUNTER — Other Ambulatory Visit: Payer: Self-pay

## 2017-06-25 NOTE — Patient Outreach (Signed)
[name]) Care Management  06/25/2017  Cheryl [name] 15-Aug-1965 [id number]   3rd telephone call to patient for monthly call. No answer.  HIPAA compliant voice message left.   Plan: RN CM will send letter to attempt outreach. If no response within 10 days will close case.   Cheryl Baseman, RN, MSN [facility] Care Management Care Management Coordinator Direct Line [phone] Toll Free: [phone]  Fax: [phone]

## 2017-06-28 DIAGNOSIS — G4733 Obstructive sleep apnea (adult) (pediatric): Secondary | ICD-10-CM | POA: Diagnosis not present

## 2017-06-29 DIAGNOSIS — E782 Mixed hyperlipidemia: Secondary | ICD-10-CM | POA: Diagnosis not present

## 2017-06-29 DIAGNOSIS — H6091 Unspecified otitis externa, right ear: Secondary | ICD-10-CM | POA: Diagnosis not present

## 2017-06-29 DIAGNOSIS — Z794 Long term (current) use of insulin: Secondary | ICD-10-CM | POA: Diagnosis not present

## 2017-06-29 DIAGNOSIS — E1142 Type 2 diabetes mellitus with diabetic polyneuropathy: Secondary | ICD-10-CM | POA: Diagnosis not present

## 2017-06-29 DIAGNOSIS — E039 Hypothyroidism, unspecified: Secondary | ICD-10-CM | POA: Diagnosis not present

## 2017-06-29 DIAGNOSIS — E1165 Type 2 diabetes mellitus with hyperglycemia: Secondary | ICD-10-CM | POA: Diagnosis not present

## 2017-07-01 ENCOUNTER — Telehealth (HOSPITAL_COMMUNITY): Payer: Self-pay | Admitting: *Deleted

## 2017-07-01 NOTE — Telephone Encounter (Signed)
Patient given detailed instructions per Myocardial Perfusion Study Information Sheet for the test on 07/08/17. Patient notified to arrive 15 minutes early and that it is imperative to arrive on time for appointment to keep from having the test rescheduled.  If you need to cancel or reschedule your appointment, please call the office within 24 hours of your appointment. . Patient verbalized understanding. Kirstie Peri

## 2017-07-03 ENCOUNTER — Other Ambulatory Visit: Payer: Self-pay

## 2017-07-03 ENCOUNTER — Encounter: Payer: Self-pay | Admitting: Nurse Practitioner

## 2017-07-03 NOTE — Patient Outreach (Signed)
[place]) Care Management  [place]  07/03/2017   Cheryl [name] Nov 08, 1965 [id number]  Subjective: Return call to patient for monthly call. Patient reports she is making it.  Patient reports she is going to have a stress test next week.  Patient reports that her sugars are all over the place.  She reports that Dr. [name] put her on victoza. She reports she has been taking it for two days.  She reports that the doctor hopes it will help her with weight loss as well.  Patient does report that her sugar this morning was 92 but it has been as high as 400.  Discussed with patient importance of taking medications and watching her diet.  She verbalized understanding.    Objective:   Encounter Medications:  Outpatient Encounter Medications as of 07/03/2017  Medication Sig Note  . albuterol (PROVENTIL HFA;VENTOLIN HFA) 108 (90 Base) MCG/ACT inhaler Inhale 2 puffs into the lungs every 6 (six) hours as needed for wheezing or shortness of breath.   . Azelastine-Fluticasone (DYMISTA) 137-50 MCG/ACT SUSP Place 2 sprays into the nose daily.   . Cholecalciferol (VITAMIN D3) 1000 units CAPS Take 1,000 Units by mouth daily.   [name] diltiazem (CARDIZEM CD) 240 MG 24 hr capsule TAKE 1 CAPSULE BY MOUTH EVERY MORNING ON AN EMPTY STOMACH - [name] DENIED, FAXED DR Johnsie Cancel 10/22/15 SS   . DULERA 100-5 MCG/ACT AERO INHALE TWO PUFFS INTO THE LUNGS TWICE DAILY THEN RINSE MOUTH   . fluticasone-salmeterol (ADVAIR HFA) 115-21 MCG/ACT inhaler Inhale 2 puffs into the lungs 2 (two) times daily.   [name] gabapentin (NEURONTIN) 300 MG capsule Take 300 mg by mouth 4 (four) times daily. Taking 1 tab in the morning and at noon,  2 capsules in the evening and 4 capsules at bedtime.   [name] HUMULIN R U-500 KWIKPEN 500 UNIT/ML kwikpen  03/18/2016: Received from: External Pharmacy  . hydrochlorothiazide (HYDRODIURIL) 25 MG tablet Take 25 mg by mouth daily.   [name] ibuprofen (ADVIL,MOTRIN) 800 MG tablet Take 800 mg by mouth  2 (two) times daily. 01/26/2017: Taking TID  . ipratropium-albuterol (DUONEB) 0.5-2.5 (3) MG/3ML SOLN Take 20m by nebulization every 4-6 hours if needed   . levothyroxine (SYNTHROID, LEVOTHROID) 75 MCG tablet Take 75 mcg by mouth daily before breakfast.   . liraglutide (VICTOZA) 18 MG/3ML SOPN Inject 1.8 mg into the skin daily.   . naltrexone (DEPADE) 50 MG tablet Take 50 mg by mouth 2 (two) times daily. 1/2 tab in the am, 1/2 tab in the pm   . nebivolol (BYSTOLIC) 10 MG tablet Take 1 tablet (10 mg total) by mouth daily.   . nortriptyline (PAMELOR) 25 MG capsule Take 25 mg by mouth at bedtime.   .Marland Kitchenomeprazole (PRILOSEC) 40 MG capsule Take 40 mg by mouth daily.    .Marland KitchentiZANidine (ZANAFLEX) 4 MG tablet  03/18/2016: Received from: External Pharmacy   No facility-administered encounter medications on file as of 07/03/2017.     Functional Status:  In your present state of health, do you have any difficulty performing the following activities: 09/09/2016  Hearing? N  Vision? Y  Difficulty concentrating or making decisions? N  Walking or climbing stairs? Y  Dressing or bathing? N  Doing errands, shopping? Y  Preparing Food and eating ? N  Using the Toilet? N  In the past six months, have you accidently leaked urine? N  Do you have problems with loss of bowel control? N  Managing your  Medications? Y  Managing your Finances? N  Housekeeping or managing your Housekeeping? Y  Some recent data might be hidden    Fall/Depression Screening: Fall Risk  07/03/2017 05/18/2017 03/24/2017  Falls in the past year? No No No  Comment - - -  Number falls in past yr: - - -  Injury with Fall? - - -  Risk Factor Category  - - -  Risk for fall due to : - - -  Follow up - - -   PHQ 2/9 Scores 07/03/2017 05/18/2017 03/24/2017 01/26/2017 12/29/2016 11/25/2016 10/20/2016  PHQ - 2 Score 1 1 1  0 0 1 2  PHQ- 9 Score - - - - - - -    Assessment: Patient continues to benefit from care manager outreach for disease management  and support.    Plan:  [facility] CM Care Plan Problem One     Most Recent Value  Care Plan Problem One  knowledge deficit related to diabetes type 2  Role Documenting the Problem One  Health [place] for Problem One  Active  THN Long Term Goal   Patient will maintain blood sugar average under 200 within 90 days.  THN Long Term Goal Start Date  07/03/17  Interventions for Problem One Long Term Goal  RN CM discussed with patient the importance of diabetic diet and watching carbohydrates.       RN CM will contact patient in the month of January and patient agrees to next outreach.  [name], RN, MSN [facility] Care Management Care Management Coordinator Direct Line [phone] Toll Free: [phone]  Fax: [phone]

## 2017-07-08 ENCOUNTER — Ambulatory Visit (HOSPITAL_COMMUNITY): Payer: Medicare HMO | Attending: Cardiology

## 2017-07-08 DIAGNOSIS — I510 Cardiac septal defect, acquired: Secondary | ICD-10-CM | POA: Insufficient documentation

## 2017-07-08 DIAGNOSIS — R079 Chest pain, unspecified: Secondary | ICD-10-CM

## 2017-07-08 MED ORDER — TECHNETIUM TC 99M TETROFOSMIN IV KIT
32.6000 | PACK | Freq: Once | INTRAVENOUS | Status: AC | PRN
Start: 2017-07-08 — End: 2017-07-08
  Administered 2017-07-08: 32.6 via INTRAVENOUS
  Filled 2017-07-08: qty 33

## 2017-07-08 MED ORDER — REGADENOSON 0.4 MG/5ML IV SOLN
0.4000 mg | Freq: Once | INTRAVENOUS | Status: AC
  Administered 2017-07-08: 0.4 mg via INTRAVENOUS

## 2017-07-13 ENCOUNTER — Ambulatory Visit (HOSPITAL_COMMUNITY): Payer: Medicare HMO | Attending: Cardiovascular Disease

## 2017-07-13 LAB — MYOCARDIAL PERFUSION IMAGING
Estimated workload: 2.9 METS
Exercise duration (min): 2 min
LV dias vol: 94 mL (ref 46–106)
LV sys vol: 40 mL
MPHR: 170 {beats}/min
Peak HR: 104 {beats}/min
Percent HR: 61 %
RATE: 0.39
RPE: 19
Rest HR: 83 {beats}/min
SDS: 2
SRS: 12
SSS: 9
TID: 1.06

## 2017-07-13 MED ORDER — TECHNETIUM TC 99M TETROFOSMIN IV KIT
31.7000 | PACK | Freq: Once | INTRAVENOUS | Status: AC | PRN
  Administered 2017-07-13: 31.7 via INTRAVENOUS
  Filled 2017-07-13: qty 32

## 2017-07-14 DIAGNOSIS — J45909 Unspecified asthma, uncomplicated: Secondary | ICD-10-CM | POA: Diagnosis not present

## 2017-07-24 ENCOUNTER — Ambulatory Visit: Payer: Medicare HMO | Admitting: Nurse Practitioner

## 2017-07-29 DIAGNOSIS — G4733 Obstructive sleep apnea (adult) (pediatric): Secondary | ICD-10-CM | POA: Diagnosis not present

## 2017-07-31 ENCOUNTER — Other Ambulatory Visit: Payer: Self-pay

## 2017-07-31 NOTE — Patient Outreach (Signed)
[facility]) Care Management  [place]  07/31/2017   Cheryl [name] [birth year] [id number]  Subjective: Telephone call to patient for monthly outreach.  Patient reports she is doing good.  Just some problems with nausea and decreased appetite.  She states that she thinks it is coming from her victoza.  However, patient noted that she is glad that her sugars are better and this mornings sugar was 175.  Patient gave 7,30, and 90 day averages which are down as well.  Encouraged patient to continue to take medications as ordered and watch her diet.  She verbalized understanding.  Patient sees primary care doctor on 09-03-17.  Discussed with patient case closure on next call.  Patient in agreement.    Objective:   Encounter Medications:  Outpatient Encounter Medications as of 07/31/2017  Medication Sig Note  . albuterol (PROVENTIL HFA;VENTOLIN HFA) 108 (90 Base) MCG/ACT inhaler Inhale 2 puffs into the lungs every 6 (six) hours as needed for wheezing or shortness of breath.   . Azelastine-Fluticasone (DYMISTA) 137-50 MCG/ACT SUSP Place 2 sprays into the nose daily.   . Cholecalciferol (VITAMIN D3) 1000 units CAPS Take 1,000 Units by mouth daily.   [name] diltiazem (CARDIZEM CD) 240 MG 24 hr capsule TAKE 1 CAPSULE BY MOUTH EVERY MORNING ON AN EMPTY STOMACH - [name] DENIED, FAXED DR Johnsie Cancel 10/22/15 SS   . DULERA 100-5 MCG/ACT AERO INHALE TWO PUFFS INTO THE LUNGS TWICE DAILY THEN RINSE MOUTH   . fluticasone-salmeterol (ADVAIR HFA) 115-21 MCG/ACT inhaler Inhale 2 puffs into the lungs 2 (two) times daily.   [name] gabapentin (NEURONTIN) 300 MG capsule Take 300 mg by mouth 4 (four) times daily. Taking 1 tab in the morning and at noon,  2 capsules in the evening and 4 capsules at bedtime.   [name] HUMULIN R U-500 KWIKPEN 500 UNIT/ML kwikpen  03/18/2016: Received from: External Pharmacy  . hydrochlorothiazide (HYDRODIURIL) 25 MG tablet Take 25 mg by mouth daily.   [name] ibuprofen (ADVIL,MOTRIN) 800 MG  tablet Take 800 mg by mouth 2 (two) times daily. 01/26/2017: Taking TID  . ipratropium-albuterol (DUONEB) 0.5-2.5 (3) MG/3ML SOLN Take 13m by nebulization every 4-6 hours if needed   . levothyroxine (SYNTHROID, LEVOTHROID) 75 MCG tablet Take 75 mcg by mouth daily before breakfast.   . liraglutide (VICTOZA) 18 MG/3ML SOPN Inject 1.8 mg into the skin daily.   . naltrexone (DEPADE) 50 MG tablet Take 50 mg by mouth 2 (two) times daily. 1/2 tab in the am, 1/2 tab in the pm   . nebivolol (BYSTOLIC) 10 MG tablet Take 1 tablet (10 mg total) by mouth daily.   . nortriptyline (PAMELOR) 25 MG capsule Take 25 mg by mouth at bedtime.   .Marland Kitchenomeprazole (PRILOSEC) 40 MG capsule Take 40 mg by mouth daily.    .Marland KitchentiZANidine (ZANAFLEX) 4 MG tablet  03/18/2016: Received from: External Pharmacy   No facility-administered encounter medications on file as of 07/31/2017.     Functional Status:  In your present state of health, do you have any difficulty performing the following activities: 07/31/2017 09/09/2016  Hearing? N N  Vision? N Y  Difficulty concentrating or making decisions? N N  Walking or climbing stairs? Y Y  Dressing or bathing? N N  Doing errands, shopping? YTempie Donning Preparing Food and eating ? N N  Using the Toilet? N N  In the past six months, have you accidently leaked urine? N N  Do you have problems with  loss of bowel control? N N  Managing your Medications? N Y  Managing your Finances? N N  Housekeeping or managing your Housekeeping? Y Y  Some recent data might be hidden    Fall/Depression Screening: Fall Risk  07/31/2017 07/03/2017 05/18/2017  Falls in the past year? No No No  Comment - - -  Number falls in past yr: - - -  Injury with Fall? - - -  Risk Factor Category  - - -  Risk for fall due to : - - -  Follow up - - -   PHQ 2/9 Scores 07/31/2017 07/03/2017 05/18/2017 03/24/2017 01/26/2017 12/29/2016 11/25/2016  PHQ - 2 Score 1 1 1 1  0 0 1  PHQ- 9 Score - - - - - - -    Assessment: Patient continues  to benefit from care manager outreach for disease management and support.    Plan:  [facility] CM Care Plan Problem One     Most Recent Value  Care Plan Problem One  knowledge deficit related to diabetes type 2  Role Documenting the Problem One  Care Management Telephonic Coordinator  Care Plan for Problem One  Active  [facility] Long Term Goal   Patient will maintain blood sugar average under 200 within 90 days.  Interventions for Problem One Long Term Goal  Patient taking victoza and sugar down.  RN CM reiterated with patient the importance of taking medications and watching diet.  S    [facility] CM Care Plan Problem Two     Most Recent Value  Care Plan for Problem Two  Not Active    [facility] CM Care Plan Problem Three     Most Recent Value  Care Plan for Problem Three  Not Active     RN CM will contact patient in the month of February and patient agrees to next outreach.  [name], RN, MSN [facility] Care Management Care Management Coordinator Direct Line [phone] Toll Free: [phone]  Fax: [phone]

## 2017-08-03 ENCOUNTER — Other Ambulatory Visit: Payer: Self-pay | Admitting: Family Medicine

## 2017-08-03 DIAGNOSIS — M25511 Pain in right shoulder: Secondary | ICD-10-CM

## 2017-08-03 DIAGNOSIS — M546 Pain in thoracic spine: Secondary | ICD-10-CM

## 2017-08-10 ENCOUNTER — Ambulatory Visit (HOSPITAL_COMMUNITY)
Admission: RE | Admit: 2017-08-10 | Discharge: 2017-08-10 | Disposition: A | Discharge status: Home / Self Care | Payer: Medicare HMO | Source: Ambulatory Visit | Attending: Family Medicine | Admitting: Family Medicine

## 2017-08-10 DIAGNOSIS — M25511 Pain in right shoulder: Secondary | ICD-10-CM

## 2017-08-10 DIAGNOSIS — M546 Pain in thoracic spine: Secondary | ICD-10-CM

## 2017-08-10 DIAGNOSIS — M47814 Spondylosis without myelopathy or radiculopathy, thoracic region: Secondary | ICD-10-CM | POA: Diagnosis not present

## 2017-08-10 DIAGNOSIS — M5124 Other intervertebral disc displacement, thoracic region: Secondary | ICD-10-CM | POA: Diagnosis not present

## 2017-08-14 DIAGNOSIS — J45909 Unspecified asthma, uncomplicated: Secondary | ICD-10-CM | POA: Diagnosis not present

## 2017-08-20 NOTE — Progress Notes (Signed)
08/21/2017 Cheryl [name]   1966-01-21  [id number]  Primary Physician Glenford Bayley, DO Primary Cardiologist: Dr. Johnsie Cancel   Reason for Visit/CC: Chest Pain   HPI:  Cheryl [name] is a 52 y.o. female who is being seen today for f/u  of Chest Pain.  Cardiac risk factors include poorly controlled IDDM. Pt notes her blood sugars recently have been in the 300s, despite compliance with insulin regimen. Also h/o HLD but patient admits that she self discontinued her Lipitor last year, because it "saved her $7.00". She also has HTN that is treated as well as a family h/o premature CAD (father w/ MI in early 85s). She denies recent tobacco use. She is a former smoker.  She has known coronary artery calcifications, noted on previous chest CT. Mild calcification of distal LM/ and LAD noted. Echo in 2016 showed normal LVEF and no significant valvular disease.   Seen by PA November 2018 with atypical chest pain had low risk myovue 07/08/17 Read by Dr Marlou Porch as small defect in basal inferoseptal location "ischemia" EF 57% I reviewed images and looked quite normal to me and SDS only 2   She continues to have exertional dyspnea, and chest pain and fluttering We discussed Options given DM and continued symptoms and favor right and left cath next week Risks discussed willing to proceed has dye allergy nausea and hives. Will pre medicate    Current Meds  Medication Sig  . albuterol (PROVENTIL HFA;VENTOLIN HFA) 108 (90 Base) MCG/ACT inhaler Inhale 2 puffs into the lungs every 6 (six) hours as needed for wheezing or shortness of breath.  [name] atorvastatin (LIPITOR) 10 MG tablet Take 10 mg by mouth daily.  . Cholecalciferol (VITAMIN D3) 1000 units CAPS Take 1,000 Units by mouth daily.  [name] diltiazem (CARDIZEM CD) 240 MG 24 hr capsule TAKE 1 CAPSULE BY MOUTH EVERY MORNING ON AN EMPTY STOMACH - [name] DENIED, FAXED DR Johnsie Cancel 10/22/15 SS  . DULERA 100-5 MCG/ACT AERO INHALE TWO PUFFS INTO THE LUNGS TWICE DAILY THEN  RINSE MOUTH  . fluticasone-salmeterol (ADVAIR HFA) 115-21 MCG/ACT inhaler Inhale 2 puffs into the lungs 2 (two) times daily.  [name] gabapentin (NEURONTIN) 300 MG capsule Take 300 mg by mouth 4 (four) times daily. Taking 1 tab in the morning and at noon,  2 capsules in the evening and 4 capsules at bedtime.  [name] HUMULIN R U-500 KWIKPEN 500 UNIT/ML kwikpen   . hydrochlorothiazide (HYDRODIURIL) 25 MG tablet Take 25 mg by mouth daily.  [name] ibuprofen (ADVIL,MOTRIN) 800 MG tablet Take 800 mg by mouth 2 (two) times daily.  [name] ipratropium-albuterol (DUONEB) 0.5-2.5 (3) MG/3ML SOLN Take 78m by nebulization every 4-6 hours if needed  . levothyroxine (SYNTHROID, LEVOTHROID) 75 MCG tablet Take 75 mcg by mouth daily before breakfast.  . liraglutide (VICTOZA) 18 MG/3ML SOPN Inject 1.8 mg into the skin daily.  . naltrexone (DEPADE) 50 MG tablet Take 50 mg by mouth 2 (two) times daily. 1/2 tab in the am, 1/2 tab in the pm  . nebivolol (BYSTOLIC) 10 MG tablet Take 1 tablet (10 mg total) by mouth daily.  . nortriptyline (PAMELOR) 25 MG capsule Take 25 mg by mouth at bedtime.  .Marland Kitchenomeprazole (PRILOSEC) 40 MG capsule Take 40 mg by mouth daily.   .Marland KitchentiZANidine (ZANAFLEX) 4 MG tablet    Allergies  Allergen Reactions  . Hydromorphone Hcl Itching and Rash    redness  . Influenza Vaccines Anaphylaxis and Swelling    Throat  swelling, "flu" symptoms  . Iodinated Diagnostic Agents Shortness Of Breath, Nausea And Vomiting, Nausea Only and Rash  . Iodine-131 Shortness Of Breath, Nausea And Vomiting and Rash  . Iohexol Anaphylaxis, Hives and Swelling     Desc: hives,dyspnea; throat swelling; ok w/ premeds and omnipaque   . Levofloxacin Anaphylaxis and Hives  . Nucynta [Tapentadol Hydrochloride] Other (See Comments)    Hallucinations and  insomnia x3 days  . Pregabalin Other (See Comments)    Reaction-Syncope & unable to speak  . Sulfonamide Derivatives Anaphylaxis  . Oxycodone-Acetaminophen Rash  . Vilazodone Hcl Other (See  Comments)    Hallucinations  . Amoxicillin Hives  . Biaxin [Clarithromycin] Hives  . Carbamazepine Itching  . Ciprofloxacin Hives  . Dilaudid [Hydromorphone Hcl] Itching and Rash    redness  . Iodides Nausea Only  . Percocet [Oxycodone-Acetaminophen] Rash  . Tapentadol Other (See Comments)    unknown   Past Medical History:  Diagnosis Date  . Anxiety   . Asthma   . Chronic airway obstruction, not elsewhere classified   . Chronic pain    "q where; herniated disc in my tailbone" (02/09/2013)  . DDD (degenerative disc disease)    CERVIAL AND LUMBAR  . Depression   . Edema   . Fatty liver disease, nonalcoholic   . Fibromyalgia    "severe" (02/09/2013)  . Gastroparesis    from DM and chronic narcotic use  . GERD (gastroesophageal reflux disease)   . Human parvovirus infection   . Hyperlipidemia   . Hypertension   . Hypothyroidism   . Interstitial cystitis   . Migraine headache    "weekly; worse lately" (02/09/2013)  . OSA (obstructive sleep apnea)    "have mask;; don't use it; no one came out to check it" (02/09/2013)  . Peripheral neuropathy    "severe" (02/09/2013)  . 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 PONV (postoperative nausea and vomiting)   . Positive PPD   . Shortness of breath    "at anytime; it's gotten worse recently" (02/09/2013)  . Type II diabetes mellitus ([place])   . Vitamin D deficiency   . Vocal cord dysfunction    Family History  Problem Relation Age of Onset  . Coronary artery disease Father    Past Surgical History:  Procedure Laterality Date  . ABDOMINAL HYSTERECTOMY  1993  . ANTERIOR CERVICAL DECOMP/DISCECTOMY FUSION  2004  . CARPAL TUNNEL RELEASE Bilateral ?1980's  . CHOLECYSTECTOMY  ?1990  . HIP SURGERY Right 2002   "did something to the tibula band" (02/09/2013)  . KNEE ARTHROSCOPY Left 1990's   "fell; clot behind knee cap had to be removed" (02/09/2013)  . NASAL SINUS SURGERY  1986; 1990's     "i've had 3" (02/09/2013)  . SHOULDER ARTHROSCOPY W/ ROTATOR CUFF REPAIR Right    "had to go deep in rotator cuff" (02/09/2013)   Social History   Socioeconomic History  . Marital status: Legally Separated    Spouse name: Not on file  . Number of children: Not on file  . Years of education: Not on file  . Highest education level: Not on file  Social Needs  . Financial resource strain: Not on file  . Food insecurity - worry: Not on file  . Food insecurity - inability: Not on file  . Transportation needs - medical: Not on file  . Transportation needs - non-medical: Not on file  Occupational History  . Not on  file  Tobacco Use  . Smoking status: Former Smoker    Packs/day: 0.10    Years: 3.00    Pack years: 0.30    Types: Cigarettes    Last attempt to quit: 07/28/2005    Years since quitting: 12.0  . Smokeless tobacco: Never Used  Substance and Sexual Activity  . Alcohol use: No  . Drug use: No  . Sexual activity: No  Other Topics Concern  . Not on file  Social History Narrative   Lives with daughter in a one story home.  On disability.  Education: college.       Review of Systems: General: negative for chills, fever, night sweats or weight changes.  Cardiovascular: negative for chest pain, dyspnea on exertion, edema, orthopnea, palpitations, paroxysmal nocturnal dyspnea or shortness of breath Dermatological: negative for rash Respiratory: negative for cough or wheezing Urologic: negative for hematuria Abdominal: negative for nausea, vomiting, diarrhea, bright red blood per rectum, melena, or hematemesis Neurologic: negative for visual changes, syncope, or dizziness All other systems reviewed and are otherwise negative except as noted above.   Physical Exam:  Blood pressure 128/84, pulse 71, height 5' 2"  (1.575 m), weight 218 lb 8 oz (99.1 kg), SpO2 97 %.  Affect appropriate Healthy:  appears stated age 83: normal Neck supple with no adenopathy JVP normal no  bruits no thyromegaly Lungs clear with no wheezing and good diaphragmatic motion Heart:  S1/S2 no murmur, no rub, gallop or click PMI normal Abdomen: benighn, BS positve, no tenderness, no AAA no bruit.  No HSM or HJR Distal pulses intact with no bruits No edema Neuro non-focal Skin warm and dry No muscular weakness   EKG NSR no ischemia -- personally reviewed   ASSESSMENT AND PLAN:   1. Chest Pain: Mild coronary artery calcifications noted on chest CT in 2016. She has multiple other cardiac risk factors including poorly controlled IDDM, untreated HLD, HTN and family h/o premature CAD. She is currently CP free. EKG non ischemic. Low risk myovue done 07/13/17  Given DM and continued symptoms Will arrange right and left cath next week Dye allergy prophylaxis labs today orders written 2. DM:  Discussed low carb diet.  Target hemoglobin A1c is 6.5 or less.  Continue current medications. 3. HTN; Well controlled.  Continue current medications and low sodium Dash type diet.   4. Dyspnea:  EF normal on myovue she does have asthma but denies unusual wheezing Will do right heart At time of catheterizaiton 5. Anxiety:  Suspect this is contributing to symptoms  Script prednisone, orders, labs , cath lab called and scheduled   Jenkins Rouge

## 2017-08-20 NOTE — H&P (View-Only) (Signed)
08/21/2017 Cheryl [name]   1965/12/11  [id number]  Primary Physician Glenford Bayley, DO Primary Cardiologist: Dr. Johnsie Cancel   Reason for Visit/CC: Chest Pain   HPI:  Cheryl [name] is a 52 y.o. female who is being seen today for f/u  of Chest Pain.  Cardiac risk factors include poorly controlled IDDM. Pt notes her blood sugars recently have been in the 300s, despite compliance with insulin regimen. Also h/o HLD but patient admits that she self discontinued her Lipitor last year, because it "saved her $7.00". She also has HTN that is treated as well as a family h/o premature CAD (father w/ MI in early 69s). She denies recent tobacco use. She is a former smoker.  She has known coronary artery calcifications, noted on previous chest CT. Mild calcification of distal LM/ and LAD noted. Echo in 2016 showed normal LVEF and no significant valvular disease.   Seen by PA November 2018 with atypical chest pain had low risk myovue 07/08/17 Read by Dr Marlou Porch as small defect in basal inferoseptal location "ischemia" EF 57% I reviewed images and looked quite normal to me and SDS only 2   She continues to have exertional dyspnea, and chest pain and fluttering We discussed Options given DM and continued symptoms and favor right and left cath next week Risks discussed willing to proceed has dye allergy nausea and hives. Will pre medicate    Current Meds  Medication Sig  . albuterol (PROVENTIL HFA;VENTOLIN HFA) 108 (90 Base) MCG/ACT inhaler Inhale 2 puffs into the lungs every 6 (six) hours as needed for wheezing or shortness of breath.  [name] atorvastatin (LIPITOR) 10 MG tablet Take 10 mg by mouth daily.  . Cholecalciferol (VITAMIN D3) 1000 units CAPS Take 1,000 Units by mouth daily.  [name] diltiazem (CARDIZEM CD) 240 MG 24 hr capsule TAKE 1 CAPSULE BY MOUTH EVERY MORNING ON AN EMPTY STOMACH - [name] DENIED, FAXED DR Johnsie Cancel 10/22/15 SS  . DULERA 100-5 MCG/ACT AERO INHALE TWO PUFFS INTO THE LUNGS TWICE DAILY THEN  RINSE MOUTH  . fluticasone-salmeterol (ADVAIR HFA) 115-21 MCG/ACT inhaler Inhale 2 puffs into the lungs 2 (two) times daily.  [name] gabapentin (NEURONTIN) 300 MG capsule Take 300 mg by mouth 4 (four) times daily. Taking 1 tab in the morning and at noon,  2 capsules in the evening and 4 capsules at bedtime.  [name] HUMULIN R U-500 KWIKPEN 500 UNIT/ML kwikpen   . hydrochlorothiazide (HYDRODIURIL) 25 MG tablet Take 25 mg by mouth daily.  [name] ibuprofen (ADVIL,MOTRIN) 800 MG tablet Take 800 mg by mouth 2 (two) times daily.  [name] ipratropium-albuterol (DUONEB) 0.5-2.5 (3) MG/3ML SOLN Take 37m by nebulization every 4-6 hours if needed  . levothyroxine (SYNTHROID, LEVOTHROID) 75 MCG tablet Take 75 mcg by mouth daily before breakfast.  . liraglutide (VICTOZA) 18 MG/3ML SOPN Inject 1.8 mg into the skin daily.  . naltrexone (DEPADE) 50 MG tablet Take 50 mg by mouth 2 (two) times daily. 1/2 tab in the am, 1/2 tab in the pm  . nebivolol (BYSTOLIC) 10 MG tablet Take 1 tablet (10 mg total) by mouth daily.  . nortriptyline (PAMELOR) 25 MG capsule Take 25 mg by mouth at bedtime.  .Marland Kitchenomeprazole (PRILOSEC) 40 MG capsule Take 40 mg by mouth daily.   .Marland KitchentiZANidine (ZANAFLEX) 4 MG tablet    Allergies  Allergen Reactions  . Hydromorphone Hcl Itching and Rash    redness  . Influenza Vaccines Anaphylaxis and Swelling    Throat  swelling, "flu" symptoms  . Iodinated Diagnostic Agents Shortness Of Breath, Nausea And Vomiting, Nausea Only and Rash  . Iodine-131 Shortness Of Breath, Nausea And Vomiting and Rash  . Iohexol Anaphylaxis, Hives and Swelling     Desc: hives,dyspnea; throat swelling; ok w/ premeds and omnipaque   . Levofloxacin Anaphylaxis and Hives  . Nucynta [Tapentadol Hydrochloride] Other (See Comments)    Hallucinations and  insomnia x3 days  . Pregabalin Other (See Comments)    Reaction-Syncope & unable to speak  . Sulfonamide Derivatives Anaphylaxis  . Oxycodone-Acetaminophen Rash  . Vilazodone Hcl Other (See  Comments)    Hallucinations  . Amoxicillin Hives  . Biaxin [Clarithromycin] Hives  . Carbamazepine Itching  . Ciprofloxacin Hives  . Dilaudid [Hydromorphone Hcl] Itching and Rash    redness  . Iodides Nausea Only  . Percocet [Oxycodone-Acetaminophen] Rash  . Tapentadol Other (See Comments)    unknown   Past Medical History:  Diagnosis Date  . Anxiety   . Asthma   . Chronic airway obstruction, not elsewhere classified   . Chronic pain    "q where; herniated disc in my tailbone" (02/09/2013)  . DDD (degenerative disc disease)    CERVIAL AND LUMBAR  . Depression   . Edema   . Fatty liver disease, nonalcoholic   . Fibromyalgia    "severe" (02/09/2013)  . Gastroparesis    from DM and chronic narcotic use  . GERD (gastroesophageal reflux disease)   . Human parvovirus infection   . Hyperlipidemia   . Hypertension   . Hypothyroidism   . Interstitial cystitis   . Migraine headache    "weekly; worse lately" (02/09/2013)  . OSA (obstructive sleep apnea)    "have mask;; don't use it; no one came out to check it" (02/09/2013)  . Peripheral neuropathy    "severe" (02/09/2013)  . 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 PONV (postoperative nausea and vomiting)   . Positive PPD   . Shortness of breath    "at anytime; it's gotten worse recently" (02/09/2013)  . Type II diabetes mellitus ([place])   . Vitamin D deficiency   . Vocal cord dysfunction    Family History  Problem Relation Age of Onset  . Coronary artery disease Father    Past Surgical History:  Procedure Laterality Date  . ABDOMINAL HYSTERECTOMY  1993  . ANTERIOR CERVICAL DECOMP/DISCECTOMY FUSION  2004  . CARPAL TUNNEL RELEASE Bilateral ?1980's  . CHOLECYSTECTOMY  ?1990  . HIP SURGERY Right 2002   "did something to the tibula band" (02/09/2013)  . KNEE ARTHROSCOPY Left 1990's   "fell; clot behind knee cap had to be removed" (02/09/2013)  . NASAL SINUS SURGERY  1986; 1990's     "i've had 3" (02/09/2013)  . SHOULDER ARTHROSCOPY W/ ROTATOR CUFF REPAIR Right    "had to go deep in rotator cuff" (02/09/2013)   Social History   Socioeconomic History  . Marital status: Legally Separated    Spouse name: Not on file  . Number of children: Not on file  . Years of education: Not on file  . Highest education level: Not on file  Social Needs  . Financial resource strain: Not on file  . Food insecurity - worry: Not on file  . Food insecurity - inability: Not on file  . Transportation needs - medical: Not on file  . Transportation needs - non-medical: Not on file  Occupational History  . Not on  file  Tobacco Use  . Smoking status: Former Smoker    Packs/day: 0.10    Years: 3.00    Pack years: 0.30    Types: Cigarettes    Last attempt to quit: 07/28/2005    Years since quitting: 12.0  . Smokeless tobacco: Never Used  Substance and Sexual Activity  . Alcohol use: No  . Drug use: No  . Sexual activity: No  Other Topics Concern  . Not on file  Social History Narrative   Lives with daughter in a one story home.  On disability.  Education: college.       Review of Systems: General: negative for chills, fever, night sweats or weight changes.  Cardiovascular: negative for chest pain, dyspnea on exertion, edema, orthopnea, palpitations, paroxysmal nocturnal dyspnea or shortness of breath Dermatological: negative for rash Respiratory: negative for cough or wheezing Urologic: negative for hematuria Abdominal: negative for nausea, vomiting, diarrhea, bright red blood per rectum, melena, or hematemesis Neurologic: negative for visual changes, syncope, or dizziness All other systems reviewed and are otherwise negative except as noted above.   Physical Exam:  Blood pressure 128/84, pulse 71, height 5' 2"  (1.575 m), weight 218 lb 8 oz (99.1 kg), SpO2 97 %.  Affect appropriate Healthy:  appears stated age 34: normal Neck supple with no adenopathy JVP normal no  bruits no thyromegaly Lungs clear with no wheezing and good diaphragmatic motion Heart:  S1/S2 no murmur, no rub, gallop or click PMI normal Abdomen: benighn, BS positve, no tenderness, no AAA no bruit.  No HSM or HJR Distal pulses intact with no bruits No edema Neuro non-focal Skin warm and dry No muscular weakness   EKG NSR no ischemia -- personally reviewed   ASSESSMENT AND PLAN:   1. Chest Pain: Mild coronary artery calcifications noted on chest CT in 2016. She has multiple other cardiac risk factors including poorly controlled IDDM, untreated HLD, HTN and family h/o premature CAD. She is currently CP free. EKG non ischemic. Low risk myovue done 07/13/17  Given DM and continued symptoms Will arrange right and left cath next week Dye allergy prophylaxis labs today orders written 2. DM:  Discussed low carb diet.  Target hemoglobin A1c is 6.5 or less.  Continue current medications. 3. HTN; Well controlled.  Continue current medications and low sodium Dash type diet.   4. Dyspnea:  EF normal on myovue she does have asthma but denies unusual wheezing Will do right heart At time of catheterizaiton 5. Anxiety:  Suspect this is contributing to symptoms  Script prednisone, orders, labs , cath lab called and scheduled   Jenkins Rouge

## 2017-08-21 ENCOUNTER — Telehealth: Payer: Self-pay

## 2017-08-21 ENCOUNTER — Other Ambulatory Visit: Payer: Self-pay | Admitting: Cardiovascular Disease

## 2017-08-21 ENCOUNTER — Encounter: Payer: Self-pay | Admitting: Cardiovascular Disease

## 2017-08-21 ENCOUNTER — Ambulatory Visit: Payer: Medicare HMO | Admitting: Cardiovascular Disease

## 2017-08-21 VITALS — BP 128/84 | HR 71 | Ht 62.0 in | Wt 218.5 lb

## 2017-08-21 DIAGNOSIS — R079 Chest pain, unspecified: Secondary | ICD-10-CM

## 2017-08-21 DIAGNOSIS — R0602 Shortness of breath: Secondary | ICD-10-CM

## 2017-08-21 MED ORDER — PREDNISONE 20 MG PO TABS: ORAL_TABLET | ORAL | 0 refills | Status: DC

## 2017-08-21 NOTE — Patient Instructions (Addendum)
Medication Instructions:  Your physician recommends that you continue on your current medications as directed. Please refer to the Current Medication list given to you today.  Labwork: Your physician recommends that you have lab work today. PT/INR, CBC, BMET  Testing/Procedures: Your physician has requested that you have a cardiac catheterization. Cardiac catheterization is used to diagnose and/or treat various heart conditions. Doctors may recommend this procedure for a number of different reasons. The most common reason is to evaluate chest pain. Chest pain can be a symptom of coronary artery disease (CAD), and cardiac catheterization can show whether plaque is narrowing or blocking your heart's arteries. This procedure is also used to evaluate the valves, as well as measure the blood flow and oxygen levels in different parts of your heart. For further information please visit [email or web address]. Please follow instruction sheet, as given.  Follow-Up: Your physician wants you to follow-up in: 2 weeks with PA/NP.    If you need a refill on your cardiac medications before your next appointment, please call your pharmacy.    [facility] [address], [place] [address] [place] [address] Dept: [phone] Loc: [phone]  Cheryl [name]  08/21/2017  You are scheduled for a Cardiac Catheterization on Wednesday, January 30 with Dr. Daneen Schick.  1. Please arrive at the [place] (Main Entrance A) at [facility]: [place], [place] [address] at 9:00 AM (two hours before your procedure to ensure your preparation). Free valet parking service is available.   Special note: Every effort is made to have your procedure done on time. Please understand that emergencies sometimes delay scheduled procedures.  2. Diet: Do not eat or drink anything after midnight prior to your procedure except  sips of water to take medications.  3. Labs: None needed.  4. Medication instructions in preparation for your procedure:  Hold Hydrochlorothiazide on Wednesday January 30.  Take only 40 units of insulin the night before your procedure. Do not take any insulin on the day of the procedure.   Take prednisone 60 mg by mouth the night before your procedure and prednisone 60 mg by mouth the morning of the procedure.  Take benadryl 50 mg by mouth the night before your procedure and benadryl 50 mg by mouth the morning of the procedure.  On the morning of your procedure, take your Aspirin and any morning medicines NOT listed above.  You may use sips of water.  5. Plan for one night stay--bring personal belongings. 6. Bring a current list of your medications and current insurance cards. 7. You MUST have a responsible person to drive you home. 8. Someone MUST be with you the first 24 hours after you arrive home or your discharge will be delayed. 9. Please wear clothes that are easy to get on and off and wear slip-on shoes.  Thank you for allowing [name] to care for you!   -- [facility]

## 2017-08-21 NOTE — Telephone Encounter (Signed)
Called patient back. She has decided to keep heart cath procedure on Wednesday.

## 2017-08-21 NOTE — Telephone Encounter (Signed)
New message     Patient calling to discuss rescheduling procedure scheduled for 1/30. Patient would prefer Tuesday 1/29 when her daughter is off work. Please call

## 2017-08-22 LAB — PROTIME-INR
INR: 1 (ref 0.8–1.2)
Prothrombin Time: 10.5 s (ref 9.1–12.0)

## 2017-08-22 LAB — BASIC METABOLIC PANEL
BUN/Creatinine Ratio: 18 (ref 9–23)
BUN: 13 mg/dL (ref 6–24)
CO2: 28 mmol/L (ref 20–29)
Calcium: 9.7 mg/dL (ref 8.7–10.2)
Chloride: 99 mmol/L (ref 96–106)
Creatinine, Ser: 0.72 mg/dL (ref 0.57–1.00)
GFR calc Af Amer: 113 mL/min/{1.73_m2} (ref >59)
GFR calc non Af Amer: 98 mL/min/{1.73_m2} (ref >59)
Glucose: 141 mg/dL — ABNORMAL HIGH (ref 65–99)
Potassium: 4.2 mmol/L (ref 3.5–5.2)
Sodium: 142 mmol/L (ref 134–144)

## 2017-08-22 LAB — CBC WITH DIFFERENTIAL/PLATELET
Basophils Absolute: 0.1 10*3/uL (ref 0.0–0.2)
Basos: 0 %
EOS (ABSOLUTE): 0.5 10*3/uL — ABNORMAL HIGH (ref 0.0–0.4)
Eos: 4 %
Hematocrit: 32.5 % — ABNORMAL LOW (ref 34.0–46.6)
Hemoglobin: 11.6 g/dL (ref 11.1–15.9)
Immature Grans (Abs): 0.1 10*3/uL (ref 0.0–0.1)
Immature Granulocytes: 1 %
Lymphocytes Absolute: 3.8 10*3/uL — ABNORMAL HIGH (ref 0.7–3.1)
Lymphs: 28 %
MCH: 29.8 pg (ref 26.6–33.0)
MCHC: 35.7 g/dL (ref 31.5–35.7)
MCV: 84 fL (ref 79–97)
Monocytes Absolute: 0.9 10*3/uL (ref 0.1–0.9)
Monocytes: 6 %
Neutrophils Absolute: 8.3 10*3/uL — ABNORMAL HIGH (ref 1.4–7.0)
Neutrophils: 61 %
Platelets: 342 10*3/uL (ref 150–379)
RBC: 3.89 x10E6/uL (ref 3.77–5.28)
RDW: 15.4 % (ref 12.3–15.4)
WBC: 13.6 10*3/uL — ABNORMAL HIGH (ref 3.4–10.8)

## 2017-08-24 ENCOUNTER — Ambulatory Visit: Payer: Medicare HMO | Admitting: Internal Medicine

## 2017-08-24 ENCOUNTER — Telehealth: Payer: Self-pay | Admitting: *Deleted

## 2017-08-24 ENCOUNTER — Encounter: Payer: Self-pay | Admitting: Internal Medicine

## 2017-08-24 VITALS — BP 122/86 | HR 86 | Ht 62.0 in | Wt 217.0 lb

## 2017-08-24 DIAGNOSIS — G4733 Obstructive sleep apnea (adult) (pediatric): Secondary | ICD-10-CM

## 2017-08-24 DIAGNOSIS — J45909 Unspecified asthma, uncomplicated: Secondary | ICD-10-CM | POA: Diagnosis not present

## 2017-08-24 NOTE — Assessment & Plan Note (Addendum)
We have not demonstrated flow obstruction.  She reports that inhalers do improve her breathing comfort. Mild intermittent. Plan-continue current meds.

## 2017-08-24 NOTE — Assessment & Plan Note (Signed)
We hope to achieve better compliance.  This has never been easy for her. Plan-CPAP desensitization.  Increase pressure range a little to 5-12.

## 2017-08-24 NOTE — Progress Notes (Signed)
Subjective:    Patient ID: Cheryl [name], female    DOB: March 29, 1966, 52 y.o.   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 She says problem is better controlled now on Bystolic. Feels stuffy/congested in her head with nasal pressure, some shifting numbness around her nose, ears stopped up without pain or headache. Admits to wheezing "a little" sometimes at night. No recent need for nebulizer. She is out of most of her bronchodilator medications without impact on her incidence of palpitation. Turned in oxygen over a year ago saying she couldn't afford it then. Asks reassessment of previously diagnosed OSA. Daughter confirms she snores loudly and holds her breath times.  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 Pt having hard time using cpap machine each night, last week couldn't use the machine. Pt requests new cpap mask, and states she wakes up worse when using machine. Pt feels she has pressure and fluid in her chest, she thinks could be pneuomonia. Download 37% compliance, AHI 10.3/hour.  Auto set 5-10.  Pressure ranging 7.9-9.6.  Most residual events are obstructive. She says she "suffocates at times" with her CPAP.  She may feel short of breath going to bed or wake that way during the night.  Occasional palpitation-pending cardiac cath for evaluation. Substernal soreness through to back goes.  Incidental pulled left back muscle recently-heating pad. Using Advair 115 HFA, Ventolin rescue inhaler about twice a week, nebulizer DuoNeb rarely. Little sputum, mostly "tightness".  Review of Systems- see HPI   + = pos Constitutional:   No-   weight loss, night sweats, fevers, chills,+irregular sleep schedule, +fatigue, lassitude. HEENT:   No-headaches, difficulty swallowing, tooth/dental problems, +sore throat,       No-  sneezing, itching, +ear ache, nasal congestion, post nasal drip,  CV:  chest pain, orthopnea, PND, swelling in lower extremities, anasarca,  dizziness, +palpitations Resp: + shortness of breath with exertion or at rest.              No- productive cough,   non-productive cough,  No- coughing up of blood.              No- change in color of mucus.   Skin: No-   rash or lesions. GI:  No-   heartburn, indigestion, abdominal pain, nausea, vomiting,  GU:  MS:  No-   joint pain or swelling.   Neuro-    +tremor  Psych:  No- change in mood or affect. + depression or anxiety.  No memory loss.  Objective:   Physical Exam      OBJ- Physical Exam General- Alert, Oriented, + Somewhat anxious, Distress- none acute. + overweight Skin- rash-none, lesions- none, excoriation- none Lymphadenopathy- none Head- atraumatic            Eyes- Gross vision intact, PERRLA, conjunctivae and  secretions clear            Ears- Hearing, canals-normal            Nose-  turbinate edema-none, no-Septal dev, mucus, polyps, erosion, perforation             Throat- Mallampati II , mucosa clear , drainage- none, tonsils- atrophic,  Neck- flexible , trachea midline, no stridor , thyroid nl, carotid no bruit Chest - symmetrical excursion , unlabored           Heart/CV- RRR , no murmur , no gallop  , no rub, nl s1 s2                           - JVD- none , edema- none, stasis changes- none, varices- none           Lung- clear to P&A, wheeze- none, cough-None , dullness-none, rub- none           Chest wall-  Abd-  Br/ Gen/ Rectal- Not done, not indicated Extrem- cyanosis- none, clubbing, none, atrophy- none, strength- nl Neuro- Unremarkable to observation

## 2017-08-24 NOTE — Telephone Encounter (Signed)
Patient contacted pre-catheterization at [facility] scheduled for: 08/26/17 11:30 AM Verified arrival time and place: [facility] NT/Main A 9 AM Nothing to eat or drink after midnight. Confirmed allergies listed, pt states they were updated at time of office visit 08/21/17.  Confirmed AM meds to be taken pre-cath with sip of water: ASA 81 mg am of Prednisone per Dr Johnsie Cancel am of  Benadryl per Dr Johnsie Cancel am of  Hold: HCTZ am of Insulin am of Victoza pm before procedure per pharmacist recommendation.  Confirmed patient has responsible person to drive home post procedure and observe patient for 24 hours: yes

## 2017-08-24 NOTE — Patient Instructions (Signed)
Order- schedule CPAP desensitization with daytime sleep center tech    Dx OSA  Order- DME Huey Romans- please increase CPAP auto range to 5-12, continue mask of choice, humidifier, supplies, AirView      Dx OSA  Ok to continue present breathing meds  Please cal if we can help

## 2017-08-26 ENCOUNTER — Encounter (HOSPITAL_COMMUNITY)
Admission: RE | Disposition: A | Discharge status: Home / Self Care | Payer: Self-pay | Source: Ambulatory Visit | Attending: Interventional Cardiology

## 2017-08-26 ENCOUNTER — Ambulatory Visit (HOSPITAL_COMMUNITY)
Admission: RE | Admit: 2017-08-26 | Discharge: 2017-08-26 | Disposition: A | Discharge status: Home / Self Care | Payer: Medicare HMO | Source: Ambulatory Visit | Attending: Interventional Cardiology | Admitting: Interventional Cardiology

## 2017-08-26 DIAGNOSIS — F329 Major depressive disorder, single episode, unspecified: Secondary | ICD-10-CM | POA: Insufficient documentation

## 2017-08-26 DIAGNOSIS — K760 Fatty (change of) liver, not elsewhere classified: Secondary | ICD-10-CM | POA: Diagnosis not present

## 2017-08-26 DIAGNOSIS — E1143 Type 2 diabetes mellitus with diabetic autonomic (poly)neuropathy: Secondary | ICD-10-CM | POA: Diagnosis not present

## 2017-08-26 DIAGNOSIS — I493 Ventricular premature depolarization: Secondary | ICD-10-CM | POA: Diagnosis not present

## 2017-08-26 DIAGNOSIS — Z882 Allergy status to sulfonamides status: Secondary | ICD-10-CM | POA: Diagnosis not present

## 2017-08-26 DIAGNOSIS — E039 Hypothyroidism, unspecified: Secondary | ICD-10-CM | POA: Diagnosis not present

## 2017-08-26 DIAGNOSIS — F419 Anxiety disorder, unspecified: Secondary | ICD-10-CM | POA: Insufficient documentation

## 2017-08-26 DIAGNOSIS — I10 Essential (primary) hypertension: Secondary | ICD-10-CM | POA: Diagnosis not present

## 2017-08-26 DIAGNOSIS — G8929 Other chronic pain: Secondary | ICD-10-CM | POA: Diagnosis not present

## 2017-08-26 DIAGNOSIS — E785 Hyperlipidemia, unspecified: Secondary | ICD-10-CM | POA: Diagnosis not present

## 2017-08-26 DIAGNOSIS — E1142 Type 2 diabetes mellitus with diabetic polyneuropathy: Secondary | ICD-10-CM | POA: Diagnosis not present

## 2017-08-26 DIAGNOSIS — Z7951 Long term (current) use of inhaled steroids: Secondary | ICD-10-CM | POA: Insufficient documentation

## 2017-08-26 DIAGNOSIS — Z885 Allergy status to narcotic agent status: Secondary | ICD-10-CM | POA: Diagnosis not present

## 2017-08-26 DIAGNOSIS — J449 Chronic obstructive pulmonary disease, unspecified: Secondary | ICD-10-CM | POA: Diagnosis not present

## 2017-08-26 DIAGNOSIS — E559 Vitamin D deficiency, unspecified: Secondary | ICD-10-CM | POA: Insufficient documentation

## 2017-08-26 DIAGNOSIS — E669 Obesity, unspecified: Secondary | ICD-10-CM | POA: Diagnosis present

## 2017-08-26 DIAGNOSIS — K219 Gastro-esophageal reflux disease without esophagitis: Secondary | ICD-10-CM | POA: Insufficient documentation

## 2017-08-26 DIAGNOSIS — M797 Fibromyalgia: Secondary | ICD-10-CM | POA: Diagnosis not present

## 2017-08-26 DIAGNOSIS — R0600 Dyspnea, unspecified: Secondary | ICD-10-CM | POA: Diagnosis not present

## 2017-08-26 DIAGNOSIS — Z87891 Personal history of nicotine dependence: Secondary | ICD-10-CM | POA: Diagnosis not present

## 2017-08-26 DIAGNOSIS — K3184 Gastroparesis: Secondary | ICD-10-CM | POA: Diagnosis not present

## 2017-08-26 DIAGNOSIS — Z794 Long term (current) use of insulin: Secondary | ICD-10-CM | POA: Insufficient documentation

## 2017-08-26 DIAGNOSIS — Z8249 Family history of ischemic heart disease and other diseases of the circulatory system: Secondary | ICD-10-CM | POA: Diagnosis not present

## 2017-08-26 DIAGNOSIS — R079 Chest pain, unspecified: Secondary | ICD-10-CM

## 2017-08-26 DIAGNOSIS — Z888 Allergy status to other drugs, medicaments and biological substances status: Secondary | ICD-10-CM | POA: Diagnosis not present

## 2017-08-26 DIAGNOSIS — G4733 Obstructive sleep apnea (adult) (pediatric): Secondary | ICD-10-CM | POA: Diagnosis not present

## 2017-08-26 DIAGNOSIS — E1165 Type 2 diabetes mellitus with hyperglycemia: Secondary | ICD-10-CM | POA: Insufficient documentation

## 2017-08-26 DIAGNOSIS — Z880 Allergy status to penicillin: Secondary | ICD-10-CM | POA: Insufficient documentation

## 2017-08-26 DIAGNOSIS — R9431 Abnormal electrocardiogram [ECG] [EKG]: Secondary | ICD-10-CM | POA: Diagnosis present

## 2017-08-26 HISTORY — PX: RIGHT/LEFT HEART CATH AND CORONARY ANGIOGRAPHY: CATH118266

## 2017-08-26 LAB — POCT I-STAT 3, VENOUS BLOOD GAS (G3P V)
Bicarbonate: 24.2 mmol/L (ref 20.0–28.0)
O2 Saturation: 72 %
TCO2: 25 mmol/L (ref 22–32)
pCO2, Ven: 37.6 mmHg — ABNORMAL LOW (ref 44.0–60.0)
pH, Ven: 7.416 (ref 7.250–7.430)
pO2, Ven: 37 mmHg (ref 32.0–45.0)

## 2017-08-26 LAB — POCT I-STAT 3, ART BLOOD GAS (G3+)
Bicarbonate: 23.3 mmol/L (ref 20.0–28.0)
O2 Saturation: 99 %
TCO2: 24 mmol/L (ref 22–32)
pCO2 arterial: 31.1 mmHg — ABNORMAL LOW (ref 32.0–48.0)
pH, Arterial: 7.483 — ABNORMAL HIGH (ref 7.350–7.450)
pO2, Arterial: 110 mmHg — ABNORMAL HIGH (ref 83.0–108.0)

## 2017-08-26 LAB — GLUCOSE, CAPILLARY: Glucose-Capillary: 212 mg/dL — ABNORMAL HIGH (ref 65–99)

## 2017-08-26 SURGERY — RIGHT/LEFT HEART CATH AND CORONARY ANGIOGRAPHY
Anesthesia: LOCAL

## 2017-08-26 MED ORDER — MIDAZOLAM HCL 2 MG/2ML IJ SOLN
INTRAMUSCULAR | Status: AC
  Filled 2017-08-26: qty 2

## 2017-08-26 MED ORDER — SODIUM CHLORIDE 0.9 % IV SOLN: 250.0000 mL | INTRAVENOUS | Status: DC | PRN

## 2017-08-26 MED ORDER — LIDOCAINE HCL (PF) 1 % IJ SOLN
INTRAMUSCULAR | Status: AC
  Filled 2017-08-26: qty 30

## 2017-08-26 MED ORDER — VERAPAMIL HCL 2.5 MG/ML IV SOLN
INTRAVENOUS | Status: AC
  Filled 2017-08-26: qty 2

## 2017-08-26 MED ORDER — LIDOCAINE HCL (PF) 1 % IJ SOLN
INTRAMUSCULAR | Status: DC | PRN
  Administered 2017-08-26 (×2): 2 mL via INTRADERMAL

## 2017-08-26 MED ORDER — ONDANSETRON HCL 4 MG/2ML IJ SOLN: 4.0000 mg | Freq: Four times a day (QID) | INTRAMUSCULAR | Status: DC | PRN

## 2017-08-26 MED ORDER — SODIUM CHLORIDE 0.9 % WEIGHT BASED INFUSION
3.0000 mL/kg/h | INTRAVENOUS | Status: AC
  Administered 2017-08-26: 3 mL/kg/h via INTRAVENOUS

## 2017-08-26 MED ORDER — SODIUM CHLORIDE 0.9% FLUSH: 3.0000 mL | INTRAVENOUS | Status: DC | PRN

## 2017-08-26 MED ORDER — MIDAZOLAM HCL 2 MG/2ML IJ SOLN
INTRAMUSCULAR | Status: DC | PRN
  Administered 2017-08-26 (×3): 1 mg via INTRAVENOUS

## 2017-08-26 MED ORDER — DIPHENHYDRAMINE HCL 50 MG/ML IJ SOLN
INTRAMUSCULAR | Status: AC
  Filled 2017-08-26: qty 1

## 2017-08-26 MED ORDER — SODIUM CHLORIDE 0.9% FLUSH: 3.0000 mL | Freq: Two times a day (BID) | INTRAVENOUS | Status: DC

## 2017-08-26 MED ORDER — DIPHENHYDRAMINE HCL 50 MG/ML IJ SOLN
INTRAMUSCULAR | Status: DC | PRN
  Administered 2017-08-26: 12.5 mg via INTRAVENOUS

## 2017-08-26 MED ORDER — IOPAMIDOL (ISOVUE-370) INJECTION 76%
INTRAVENOUS | Status: DC | PRN
  Administered 2017-08-26: 50 mL via INTRA_ARTERIAL

## 2017-08-26 MED ORDER — SODIUM CHLORIDE 0.9 % IV SOLN: INTRAVENOUS | Status: DC

## 2017-08-26 MED ORDER — HEPARIN (PORCINE) IN NACL 2-0.9 UNIT/ML-% IJ SOLN
INTRAMUSCULAR | Status: AC | PRN
  Administered 2017-08-26: 1000 mL

## 2017-08-26 MED ORDER — FENTANYL CITRATE (PF) 100 MCG/2ML IJ SOLN
INTRAMUSCULAR | Status: AC
  Filled 2017-08-26: qty 2

## 2017-08-26 MED ORDER — SODIUM CHLORIDE 0.9 % WEIGHT BASED INFUSION: 1.0000 mL/kg/h | INTRAVENOUS | Status: DC

## 2017-08-26 MED ORDER — VERAPAMIL HCL 2.5 MG/ML IV SOLN
INTRAVENOUS | Status: DC | PRN
  Administered 2017-08-26: 10 mL via INTRA_ARTERIAL

## 2017-08-26 MED ORDER — HEPARIN (PORCINE) IN NACL 2-0.9 UNIT/ML-% IJ SOLN
INTRAMUSCULAR | Status: AC
  Filled 2017-08-26: qty 500

## 2017-08-26 MED ORDER — HEPARIN SODIUM (PORCINE) 1000 UNIT/ML IJ SOLN
INTRAMUSCULAR | Status: AC
  Filled 2017-08-26: qty 1

## 2017-08-26 MED ORDER — IOPAMIDOL (ISOVUE-370) INJECTION 76%
INTRAVENOUS | Status: AC
  Filled 2017-08-26: qty 100

## 2017-08-26 MED ORDER — ASPIRIN 81 MG PO CHEW: 81.0000 mg | CHEWABLE_TABLET | ORAL | Status: DC

## 2017-08-26 MED ORDER — FENTANYL CITRATE (PF) 100 MCG/2ML IJ SOLN
INTRAMUSCULAR | Status: DC | PRN
  Administered 2017-08-26: 50 ug via INTRAVENOUS

## 2017-08-26 SURGICAL SUPPLY — 14 items
CATH 5FR JL3.5 JR4 ANG PIG MP (CATHETERS) ×2 IMPLANT
CATH BALLN WEDGE 5F 110CM (CATHETERS) ×2 IMPLANT
CATH LAUNCHER 5F EBU3.0 (CATHETERS) ×1 IMPLANT
CATHETER LAUNCHER 5F EBU3.0 (CATHETERS) ×2
DEVICE RAD COMP TR BAND LRG (VASCULAR PRODUCTS) ×2 IMPLANT
GLIDESHEATH SLEND A-KIT 6F 22G (SHEATH) ×2 IMPLANT
GUIDEWIRE INQWIRE 1.5J.035X260 (WIRE) ×1 IMPLANT
INQWIRE 1.5J .035X260CM (WIRE) ×2
KIT HEART LEFT (KITS) ×2 IMPLANT
PACK CARDIAC CATHETERIZATION (CUSTOM PROCEDURE TRAY) ×2 IMPLANT
SHEATH GLIDE SLENDER 4/5FR (SHEATH) ×2 IMPLANT
TRANSDUCER W/STOPCOCK (MISCELLANEOUS) ×2 IMPLANT
TUBING CIL FLEX 10 FLL-RA (TUBING) ×2 IMPLANT
WIRE HI TORQ VERSACORE-J 145CM (WIRE) ×2 IMPLANT

## 2017-08-26 NOTE — Discharge Instructions (Signed)

## 2017-08-26 NOTE — Progress Notes (Signed)
Patient took ASA 63m , Benadryl 574m Prednisone at 0700 prior to admission.

## 2017-08-26 NOTE — Progress Notes (Signed)
TR BAND REMOVAL  LOCATION:    right radial  DEFLATED PER PROTOCOL:    Yes.    TIME BAND OFF / DRESSING APPLIED:    1415   SITE UPON ARRIVAL:    Level 0  SITE AFTER BAND REMOVAL:    Level 0  CIRCULATION SENSATION AND MOVEMENT:    Within Normal Limits   Yes.    COMMENTS:   tegaderm dsg applied

## 2017-08-26 NOTE — Interval H&P Note (Signed)
Cath Lab Visit (complete for each Cath Lab visit)  Clinical Evaluation Leading to the Procedure:   ACS: No.  Non-ACS:    Anginal Classification: CCS II  Anti-ischemic medical therapy: Minimal Therapy (1 class of medications)  Non-Invasive Test Results: Low-risk stress test findings: cardiac mortality <1%/year  Prior CABG: No previous CABG      History and Physical Interval Note:  08/26/2017 11:42 AM  Cheryl [name]  has presented today for surgery, with the diagnosis of cp, sob  The various methods of treatment have been discussed with the patient and family. After consideration of risks, benefits and other options for treatment, the patient has consented to  Procedure(s): RIGHT/LEFT HEART CATH AND CORONARY ANGIOGRAPHY (N/A) as a surgical intervention .  The patient's history has been reviewed, patient examined, no change in status, stable for surgery.  I have reviewed the patient's chart and labs.  Questions were answered to the patient's satisfaction.     Belva Crome III

## 2017-08-27 ENCOUNTER — Encounter (HOSPITAL_COMMUNITY): Payer: Self-pay | Admitting: Interventional Cardiology

## 2017-08-27 MED FILL — Heparin Sodium (Porcine) Inj 1000 Unit/ML: INTRAMUSCULAR | Qty: 10 | Status: AC

## 2017-08-29 DIAGNOSIS — G4733 Obstructive sleep apnea (adult) (pediatric): Secondary | ICD-10-CM | POA: Diagnosis not present

## 2017-09-01 ENCOUNTER — Other Ambulatory Visit: Payer: Self-pay

## 2017-09-01 NOTE — Patient Outreach (Signed)
[facility]) Care Management  [place]  09/01/2017   Cheryl [name] 1965/10/23 [id number]  Subjective:  Telephone call to patient for monthly call and case closure.  Patient reports she is doing good.  She reports that her cardiac cath was ok and did not show anything but some PVC's occasionally. Patient reports that her sugars are good and that they were up due to dose of prednisone before her cardiac cath.  However, she states that her sugars are back down and are doing good.  Advised patient to continue her regimen and discussed case closure.  She verbalized understanding.  Objective:   Encounter Medications:  Outpatient Encounter Medications as of 09/01/2017  Medication Sig Note  . albuterol (PROVENTIL HFA;VENTOLIN HFA) 108 (90 Base) MCG/ACT inhaler Inhale 2 puffs into the lungs every 6 (six) hours as needed for wheezing or shortness of breath.   [name] atorvastatin (LIPITOR) 10 MG tablet Take 10 mg by mouth daily.   . Cholecalciferol (VITAMIN D3) 1000 units CAPS Take 1,000 Units by mouth daily.   [name] diltiazem (CARDIZEM CD) 240 MG 24 hr capsule TAKE 1 CAPSULE BY MOUTH EVERY MORNING ON AN EMPTY STOMACH - [name] DENIED, FAXED DR Johnsie Cancel 10/22/15 SS   . FLUoxetine (PROZAC) 20 MG capsule Take 20 mg by mouth daily.   . fluticasone-salmeterol (ADVAIR HFA) 115-21 MCG/ACT inhaler Inhale 2 puffs into the lungs 2 (two) times daily.   [name] gabapentin (NEURONTIN) 300 MG capsule Take 1,200 mg by mouth at bedtime. 4 capsules at bedtime.   [name] HUMULIN R U-500 KWIKPEN 500 UNIT/ML kwikpen Inject 80-140 Units into the skin See admin instructions. 140 units in the morning(daily) and  80 units in the evening (As needed)   . hydrochlorothiazide (HYDRODIURIL) 25 MG tablet Take 25 mg by mouth daily.   [name] ibuprofen (ADVIL,MOTRIN) 800 MG tablet Take 800 mg by mouth 3 (three) times daily.    [name] ipratropium-albuterol (DUONEB) 0.5-2.5 (3) MG/3ML SOLN Take 79m by nebulization every 4-6 hours if needed  (Patient taking differently: Take 3 mLs by nebulization every 4 (four) hours as needed. Take 381mby nebulization every 4-6 hours if needed)   . levothyroxine (SYNTHROID, LEVOTHROID) 75 MCG tablet Take 75 mcg by mouth daily before breakfast.   . liraglutide (VICTOZA) 18 MG/3ML SOPN Inject 1.2 mg into the skin every evening.    . nebivolol (BYSTOLIC) 10 MG tablet Take 1 tablet (10 mg total) by mouth daily.   . nortriptyline (PAMELOR) 25 MG capsule Take 25 mg by mouth at bedtime.   . Marland Kitchenmeprazole (PRILOSEC) 40 MG capsule Take 40 mg by mouth daily.    . Marland KitcheniZANidine (ZANAFLEX) 4 MG tablet Take 4 mg by mouth 3 (three) times daily.    . predniSONE (DELTASONE) 20 MG tablet Take 60 mg (3 tablets) by mouth the night before your procedure and 60 mg (3 tablets) by mouth the morning of your procedure 08/21/2017: FOR PROCEDURE ONLY   No facility-administered encounter medications on file as of 09/01/2017.     Functional Status:  In your present state of health, do you have any difficulty performing the following activities: 07/31/2017 09/09/2016  Hearing? N N  Vision? N Y  Difficulty concentrating or making decisions? N N  Walking or climbing stairs? Y Y  Dressing or bathing? N N  Doing errands, shopping? Y Tempie DonningPreparing Food and eating ? N N  Using the Toilet? N N  In the past six months, have you accidently  leaked urine? N N  Do you have problems with loss of bowel control? N N  Managing your Medications? N Y  Managing your Finances? N N  Housekeeping or managing your Housekeeping? Y Y  Some recent data might be hidden    Fall/Depression Screening: Fall Risk  07/31/2017 07/03/2017 05/18/2017  Falls in the past year? No No No  Comment - - -  Number falls in past yr: - - -  Injury with Fall? - - -  Risk Factor Category  - - -  Risk for fall due to : - - -  Follow up - - -   PHQ 2/9 Scores 07/31/2017 07/03/2017 05/18/2017 03/24/2017 01/26/2017 12/29/2016 11/25/2016  PHQ - 2 Score 1 1 1 1  0 0 1  PHQ- 9 Score - -  - - - - -    Assessment: Patient ready for closure as care management goals met.    Plan:  [facility] CM Care Plan Problem One     Most Recent Value  Care Plan Problem One  knowledge deficit related to diabetes type 2  Role Documenting the Problem One  Care Management Telephonic Coordinator  Care Plan for Problem One  Active  [facility] Long Term Goal   Patient will maintain blood sugar average under 200 within 90 days.  [facility] Long Term Goal Start Date  07/03/17  [facility] Long Term Goal Met Date  09/01/17  Interventions for Problem One Long Term Goal  Patient blood sugars per patient less than 200 .    [facility] CM Care Plan Problem Two     Most Recent Value  Care Plan for Problem Two  Not Active    [facility] CM Care Plan Problem Three     Most Recent Value  Care Plan for Problem Three  Not Active     RN CM will close case and notify physician and care management assistant of case status.  [name], RN, MSN [facility] Care Management Care Management Coordinator Direct Line [phone] Toll Free: [phone]  Fax: [phone]

## 2017-09-04 ENCOUNTER — Ambulatory Visit: Payer: Medicare HMO | Admitting: Cardiology

## 2017-09-09 ENCOUNTER — Other Ambulatory Visit (HOSPITAL_BASED_OUTPATIENT_CLINIC_OR_DEPARTMENT_OTHER): Payer: Medicare HMO

## 2017-09-10 DIAGNOSIS — R109 Unspecified abdominal pain: Secondary | ICD-10-CM | POA: Diagnosis not present

## 2017-09-10 DIAGNOSIS — M940 Chondrocostal junction syndrome [Tietze]: Secondary | ICD-10-CM | POA: Diagnosis not present

## 2017-09-10 DIAGNOSIS — R071 Chest pain on breathing: Secondary | ICD-10-CM | POA: Diagnosis not present

## 2017-09-10 DIAGNOSIS — R0781 Pleurodynia: Secondary | ICD-10-CM | POA: Diagnosis not present

## 2017-09-14 DIAGNOSIS — J45909 Unspecified asthma, uncomplicated: Secondary | ICD-10-CM | POA: Diagnosis not present

## 2017-09-17 DIAGNOSIS — M67911 Unspecified disorder of synovium and tendon, right shoulder: Secondary | ICD-10-CM | POA: Diagnosis not present

## 2017-09-21 ENCOUNTER — Ambulatory Visit (HOSPITAL_BASED_OUTPATIENT_CLINIC_OR_DEPARTMENT_OTHER): Payer: Medicare HMO | Attending: Internal Medicine | Admitting: Radiology

## 2017-09-21 DIAGNOSIS — G4733 Obstructive sleep apnea (adult) (pediatric): Secondary | ICD-10-CM

## 2017-09-24 ENCOUNTER — Ambulatory Visit: Payer: Medicare HMO | Admitting: Cardiology

## 2017-09-26 DIAGNOSIS — G4733 Obstructive sleep apnea (adult) (pediatric): Secondary | ICD-10-CM | POA: Diagnosis not present

## 2017-10-07 DIAGNOSIS — M67911 Unspecified disorder of synovium and tendon, right shoulder: Secondary | ICD-10-CM | POA: Diagnosis not present

## 2017-10-12 DIAGNOSIS — J45909 Unspecified asthma, uncomplicated: Secondary | ICD-10-CM | POA: Diagnosis not present

## 2017-10-15 DIAGNOSIS — M797 Fibromyalgia: Secondary | ICD-10-CM | POA: Diagnosis not present

## 2017-10-15 DIAGNOSIS — Z1211 Encounter for screening for malignant neoplasm of colon: Secondary | ICD-10-CM | POA: Diagnosis not present

## 2017-10-15 DIAGNOSIS — E1142 Type 2 diabetes mellitus with diabetic polyneuropathy: Secondary | ICD-10-CM | POA: Diagnosis not present

## 2017-10-15 DIAGNOSIS — R5383 Other fatigue: Secondary | ICD-10-CM | POA: Diagnosis not present

## 2017-10-15 DIAGNOSIS — G894 Chronic pain syndrome: Secondary | ICD-10-CM | POA: Diagnosis not present

## 2017-10-15 DIAGNOSIS — Z794 Long term (current) use of insulin: Secondary | ICD-10-CM | POA: Diagnosis not present

## 2017-10-15 DIAGNOSIS — R040 Epistaxis: Secondary | ICD-10-CM | POA: Diagnosis not present

## 2017-10-15 DIAGNOSIS — E039 Hypothyroidism, unspecified: Secondary | ICD-10-CM | POA: Diagnosis not present

## 2017-10-22 DIAGNOSIS — E782 Mixed hyperlipidemia: Secondary | ICD-10-CM | POA: Diagnosis not present

## 2017-10-22 DIAGNOSIS — Z794 Long term (current) use of insulin: Secondary | ICD-10-CM | POA: Diagnosis not present

## 2017-10-22 DIAGNOSIS — E1142 Type 2 diabetes mellitus with diabetic polyneuropathy: Secondary | ICD-10-CM | POA: Diagnosis not present

## 2017-10-22 DIAGNOSIS — E1165 Type 2 diabetes mellitus with hyperglycemia: Secondary | ICD-10-CM | POA: Diagnosis not present

## 2017-10-22 DIAGNOSIS — E039 Hypothyroidism, unspecified: Secondary | ICD-10-CM | POA: Diagnosis not present

## 2017-10-27 DIAGNOSIS — G4733 Obstructive sleep apnea (adult) (pediatric): Secondary | ICD-10-CM | POA: Diagnosis not present

## 2017-10-28 ENCOUNTER — Encounter: Payer: Self-pay | Admitting: Gastroenterology

## 2017-11-01 ENCOUNTER — Other Ambulatory Visit: Payer: Self-pay | Admitting: Cardiovascular Disease

## 2017-11-12 ENCOUNTER — Other Ambulatory Visit: Payer: Self-pay | Admitting: Internal Medicine

## 2017-11-12 DIAGNOSIS — J45909 Unspecified asthma, uncomplicated: Secondary | ICD-10-CM | POA: Diagnosis not present

## 2017-11-12 DIAGNOSIS — N6310 Unspecified lump in the right breast, unspecified quadrant: Secondary | ICD-10-CM

## 2017-11-18 ENCOUNTER — Ambulatory Visit
Admission: RE | Admit: 2017-11-18 | Discharge: 2017-11-18 | Disposition: A | Discharge status: Home / Self Care | Payer: Medicare HMO | Source: Ambulatory Visit | Attending: Internal Medicine | Admitting: Internal Medicine

## 2017-11-18 ENCOUNTER — Ambulatory Visit: Payer: Medicare HMO

## 2017-11-18 DIAGNOSIS — N6310 Unspecified lump in the right breast, unspecified quadrant: Secondary | ICD-10-CM

## 2017-11-18 DIAGNOSIS — R928 Other abnormal and inconclusive findings on diagnostic imaging of breast: Secondary | ICD-10-CM | POA: Diagnosis not present

## 2017-11-18 HISTORY — DX: Malignant (primary) neoplasm, unspecified: C80.1

## 2017-11-26 DIAGNOSIS — G4733 Obstructive sleep apnea (adult) (pediatric): Secondary | ICD-10-CM | POA: Diagnosis not present

## 2017-12-10 DIAGNOSIS — J0140 Acute pansinusitis, unspecified: Secondary | ICD-10-CM | POA: Diagnosis not present

## 2017-12-10 DIAGNOSIS — J440 Chronic obstructive pulmonary disease with acute lower respiratory infection: Secondary | ICD-10-CM | POA: Diagnosis not present

## 2017-12-10 DIAGNOSIS — F419 Anxiety disorder, unspecified: Secondary | ICD-10-CM | POA: Diagnosis not present

## 2017-12-10 DIAGNOSIS — Z6837 Body mass index (BMI) 37.0-37.9, adult: Secondary | ICD-10-CM | POA: Diagnosis not present

## 2017-12-10 DIAGNOSIS — M797 Fibromyalgia: Secondary | ICD-10-CM | POA: Diagnosis not present

## 2017-12-10 DIAGNOSIS — E669 Obesity, unspecified: Secondary | ICD-10-CM | POA: Diagnosis not present

## 2017-12-12 DIAGNOSIS — J45909 Unspecified asthma, uncomplicated: Secondary | ICD-10-CM | POA: Diagnosis not present

## 2017-12-17 ENCOUNTER — Ambulatory Visit (AMBULATORY_SURGERY_CENTER): Payer: Self-pay

## 2017-12-17 ENCOUNTER — Other Ambulatory Visit: Payer: Self-pay

## 2017-12-17 VITALS — Ht 62.0 in | Wt 218.2 lb

## 2017-12-17 DIAGNOSIS — Z1211 Encounter for screening for malignant neoplasm of colon: Secondary | ICD-10-CM

## 2017-12-17 MED ORDER — NA SULFATE-K SULFATE-MG SULF 17.5-3.13-1.6 GM/177ML PO SOLN: 1.0000 | Freq: Once | ORAL | 0 refills | Status: AC

## 2017-12-17 NOTE — Progress Notes (Signed)
Denies allergies to eggs or soy products. Denies complication of anesthesia or sedation. Denies use of weight loss medication. Denies use of O2.   Emmi instructions declined.  

## 2017-12-27 DIAGNOSIS — G4733 Obstructive sleep apnea (adult) (pediatric): Secondary | ICD-10-CM | POA: Diagnosis not present

## 2017-12-28 ENCOUNTER — Encounter: Payer: Self-pay | Admitting: Gastroenterology

## 2017-12-31 ENCOUNTER — Encounter: Payer: Self-pay | Admitting: Gastroenterology

## 2017-12-31 ENCOUNTER — Other Ambulatory Visit: Payer: Self-pay

## 2017-12-31 ENCOUNTER — Ambulatory Visit (AMBULATORY_SURGERY_CENTER): Payer: Medicare HMO | Admitting: Gastroenterology

## 2017-12-31 VITALS — BP 115/49 | HR 76 | Temp 97.3°F | Resp 15 | Ht 62.0 in | Wt 218.0 lb

## 2017-12-31 DIAGNOSIS — Z1211 Encounter for screening for malignant neoplasm of colon: Secondary | ICD-10-CM | POA: Diagnosis present

## 2017-12-31 MED ORDER — SODIUM CHLORIDE 0.9 % IV SOLN: 500.0000 mL | Freq: Once | INTRAVENOUS | Status: DC

## 2017-12-31 NOTE — Progress Notes (Signed)
Pt's states no medical or surgical changes since previsit or office visit. 

## 2017-12-31 NOTE — Patient Instructions (Signed)
YOU HAD AN ENDOSCOPIC PROCEDURE TODAY AT [facility]:   Refer to the procedure report that was given to you for any specific questions about what was found during the examination.  If the procedure report does not answer your questions, please call your gastroenterologist to clarify.  If you requested that your care partner not be given the details of your procedure findings, then the procedure report has been included in a sealed envelope for you to review at your convenience later.  YOU SHOULD EXPECT: Some feelings of bloating in the abdomen. Passage of more gas than usual.  Walking can help get rid of the air that was put into your GI tract during the procedure and reduce the bloating. If you had a lower endoscopy (such as a colonoscopy or flexible sigmoidoscopy) you may notice spotting of blood in your stool or on the toilet paper. If you underwent a bowel prep for your procedure, you may not have a normal bowel movement for a few days.  Please Note:  You might notice some irritation and congestion in your nose or some drainage.  This is from the oxygen used during your procedure.  There is no need for concern and it should clear up in a day or so.  SYMPTOMS TO REPORT IMMEDIATELY:   Following lower endoscopy (colonoscopy or flexible sigmoidoscopy):  Excessive amounts of blood in the stool  Significant tenderness or worsening of abdominal pains  Swelling of the abdomen that is new, acute  Fever of 100F or higher    For urgent or emergent issues, a gastroenterologist can be reached at any hour by calling [phone].   DIET:  We do recommend a small meal at first, but then you may proceed to your regular diet.  Drink plenty of fluids but you should avoid alcoholic beverages for 24 hours.  ACTIVITY:  You should plan to take it easy for the rest of today and you should NOT DRIVE or use heavy machinery until tomorrow (because of the sedation medicines used during the test).     FOLLOW UP: Our staff will call the number listed on your records the next business day following your procedure to check on you and address any questions or concerns that you may have regarding the information given to you following your procedure. If we do not reach you, we will leave a message.  However, if you are feeling well and you are not experiencing any problems, there is no need to return our call.  We will assume that you have returned to your regular daily activities without incident.  If any biopsies were taken you will be contacted by phone or by letter within the next 1-3 weeks.  Please call us at [phone] if you have not heard about the biopsies in 3 weeks.    SIGNATURES/CONFIDENTIALITY: You and/or your care partner have signed paperwork which will be entered into your electronic medical record.  These signatures attest to the fact that that the information above on your After Visit Summary has been reviewed and is understood.  Full responsibility of the confidentiality of this discharge information lies with you and/or your care-partner.

## 2017-12-31 NOTE — Op Note (Signed)
[facility] Patient Name: Cheryl [name] Procedure Date: 12/31/2017 10:46 AM MRN: [id number] Endoscopist: Ladene Artist , MD Age: 52 Referring MD:  Date of Birth: 1965/09/16 Gender: Female Account #: [id number] Procedure:                Colonoscopy Indications:              Screening for colorectal malignant neoplasm Medicines:                Monitored Anesthesia Care Procedure:                Pre-Anesthesia Assessment:                           - Prior to the procedure, a History and Physical                            was performed, and patient medications and                            allergies were reviewed. The patient's tolerance of                            previous anesthesia was also reviewed. The risks                            and benefits of the procedure and the sedation                            options and risks were discussed with the patient.                            All questions were answered, and informed consent                            was obtained. Prior Anticoagulants: The patient has                            taken no previous anticoagulant or antiplatelet                            agents. ASA Grade Assessment: II - A patient with                            mild systemic disease. After reviewing the risks                            and benefits, the patient was deemed in                            satisfactory condition to undergo the procedure.                           After obtaining informed consent, the colonoscope  was passed under direct vision. Throughout the                            procedure, the patient's blood pressure, pulse, and                            oxygen saturations were monitored continuously. The                            Colonoscope was introduced through the anus and                            advanced to the the cecum, identified by                            appendiceal orifice and  ileocecal valve. The                            ileocecal valve, appendiceal orifice, and rectum                            were photographed. The quality of the bowel                            preparation was good. The colonoscopy was performed                            without difficulty. The patient tolerated the                            procedure well. Scope In: 10:57:02 AM Scope Out: 11:13:39 AM Scope Withdrawal Time: 0 hours 11 minutes 34 seconds  Total Procedure Duration: 0 hours 16 minutes 37 seconds  Findings:                 The perianal and digital rectal examinations were                            normal.                           Internal hemorrhoids were found during                            retroflexion. The hemorrhoids were medium-sized and                            Grade I (internal hemorrhoids that do not prolapse).                           The exam was otherwise without abnormality on                            direct and retroflexion views. Complications:            No immediate complications. Estimated blood loss:  None. Estimated Blood Loss:     Estimated blood loss: none. Impression:               - Internal hemorrhoids.                           - The examination was otherwise normal on direct                            and retroflexion views.                           - No specimens collected. Recommendation:           - Repeat colonoscopy in 10 years for screening                            purposes.                           - Patient has a contact number available for                            emergencies. The signs and symptoms of potential                            delayed complications were discussed with the                            patient. Return to normal activities tomorrow.                            Written discharge instructions were provided to the                            patient.                            - Resume previous diet.                           - Continue present medications. Ladene Artist, MD 12/31/2017 11:18:00 AM This report has been signed electronically.

## 2017-12-31 NOTE — Progress Notes (Signed)
To PACU, VSS. Report to [email or web address] 

## 2018-01-01 ENCOUNTER — Telehealth: Payer: Self-pay | Admitting: *Deleted

## 2018-01-01 NOTE — Telephone Encounter (Signed)
  Follow up Call-  Call back number 12/31/2017  Post procedure Call Back phone  # [phone]  Permission to leave phone message Yes  Some recent data might be hidden     Patient questions:  Do you have a fever, pain , or abdominal swelling? No. Pain Score  0 *  Have you tolerated food without any problems? Yes.    Have you been able to return to your normal activities? Yes.    Do you have any questions about your discharge instructions: Diet   No. Medications  No. Follow up visit  No.  Do you have questions or concerns about your Care? No.  Actions: * If pain score is 4 or above: No action needed, pain <4.

## 2018-01-12 DIAGNOSIS — M797 Fibromyalgia: Secondary | ICD-10-CM | POA: Diagnosis not present

## 2018-01-12 DIAGNOSIS — R339 Retention of urine, unspecified: Secondary | ICD-10-CM | POA: Diagnosis not present

## 2018-01-12 DIAGNOSIS — M130 Polyarthritis, unspecified: Secondary | ICD-10-CM | POA: Diagnosis not present

## 2018-01-12 DIAGNOSIS — R109 Unspecified abdominal pain: Secondary | ICD-10-CM | POA: Diagnosis not present

## 2018-01-26 DIAGNOSIS — G4733 Obstructive sleep apnea (adult) (pediatric): Secondary | ICD-10-CM | POA: Diagnosis not present

## 2018-02-22 ENCOUNTER — Ambulatory Visit: Payer: Medicare HMO | Admitting: Internal Medicine

## 2018-02-23 DIAGNOSIS — M797 Fibromyalgia: Secondary | ICD-10-CM | POA: Diagnosis not present

## 2018-02-23 DIAGNOSIS — G8929 Other chronic pain: Secondary | ICD-10-CM | POA: Diagnosis not present

## 2018-02-23 DIAGNOSIS — L299 Pruritus, unspecified: Secondary | ICD-10-CM | POA: Diagnosis not present

## 2018-02-23 DIAGNOSIS — F419 Anxiety disorder, unspecified: Secondary | ICD-10-CM | POA: Diagnosis not present

## 2018-03-03 DIAGNOSIS — R1031 Right lower quadrant pain: Secondary | ICD-10-CM | POA: Diagnosis not present

## 2018-03-03 DIAGNOSIS — R1030 Lower abdominal pain, unspecified: Secondary | ICD-10-CM | POA: Diagnosis not present

## 2018-03-03 DIAGNOSIS — R110 Nausea: Secondary | ICD-10-CM | POA: Diagnosis not present

## 2018-03-24 DIAGNOSIS — J018 Other acute sinusitis: Secondary | ICD-10-CM | POA: Diagnosis not present

## 2018-03-24 DIAGNOSIS — K224 Dyskinesia of esophagus: Secondary | ICD-10-CM | POA: Diagnosis not present

## 2018-03-24 DIAGNOSIS — R1031 Right lower quadrant pain: Secondary | ICD-10-CM | POA: Diagnosis not present

## 2018-04-22 ENCOUNTER — Ambulatory Visit: Payer: Medicare HMO | Admitting: Gastroenterology

## 2018-04-22 ENCOUNTER — Encounter: Payer: Self-pay | Admitting: Gastroenterology

## 2018-04-22 VITALS — BP 152/90 | HR 98 | Ht 62.0 in | Wt 218.0 lb

## 2018-04-22 DIAGNOSIS — R490 Dysphonia: Secondary | ICD-10-CM | POA: Diagnosis not present

## 2018-04-22 DIAGNOSIS — Z794 Long term (current) use of insulin: Secondary | ICD-10-CM

## 2018-04-22 DIAGNOSIS — K219 Gastro-esophageal reflux disease without esophagitis: Secondary | ICD-10-CM

## 2018-04-22 DIAGNOSIS — R1310 Dysphagia, unspecified: Secondary | ICD-10-CM | POA: Diagnosis not present

## 2018-04-22 DIAGNOSIS — E119 Type 2 diabetes mellitus without complications: Secondary | ICD-10-CM

## 2018-04-22 DIAGNOSIS — IMO0001 Reserved for inherently not codable concepts without codable children: Secondary | ICD-10-CM

## 2018-04-22 MED ORDER — OMEPRAZOLE 40 MG PO CPDR: 40.0000 mg | DELAYED_RELEASE_CAPSULE | Freq: Two times a day (BID) | ORAL | 2 refills | Status: DC

## 2018-04-22 NOTE — Progress Notes (Signed)
04/22/2018 Cheryl [name] [id number] Nov 29, 1965   HISTORY OF PRESENT ILLNESS:  This is a 52 year old female who is a patient of Dr. Lynne Leader.  Has history of GERD and gastroparesis.  She presents here today with her mother for complaints of what she describes as "esophageal spasms".  She is very dramatic.  Says that when she eats or drinks, and even sometimes when she talks it feels like her throat is closing.  This has been present for a few months or so.  Says that sometimes it feels like she is having a panic attack.  Also complains of hoarseness.  Was evaluated for UGI issues in 2009 and actually had a pH study with [facility] at that time that showed good control of symptoms on PPI.  She is on omeprazole 40 mg daily.  Was apparently on BID PPI in the past.  She is very anxious about her symptoms.   Past Medical History:  Diagnosis Date  . Allergy   . Anxiety   . Asthma   . Cancer ([place])    melanoma 2015 upper Left arm   . Chronic airway obstruction, not elsewhere classified   . Chronic pain    "q where; herniated disc in my tailbone" (02/09/2013)  . DDD (degenerative disc disease)    CERVIAL AND LUMBAR  . Depression   . Edema   . Fatty liver disease, nonalcoholic   . Fibromyalgia    "severe" (02/09/2013)  . Gastroparesis    from DM and chronic narcotic use  . GERD (gastroesophageal reflux disease)   . Human parvovirus infection   . Hyperlipidemia   . Hypertension   . Hypothyroidism   . Interstitial cystitis   . Migraine headache    "weekly; worse lately" (02/09/2013)  . OSA (obstructive sleep apnea)    "have mask;; don't use it; no one came out to check it" (02/09/2013)  . Osteoporosis   . Peripheral neuropathy    "severe" (02/09/2013)  . 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 PONV (postoperative nausea and vomiting)   . Positive PPD   . Shortness of breath    "at anytime; it's gotten worse recently" (02/09/2013)  . Sleep  apnea   . Type II diabetes mellitus ([place])   . Vitamin D deficiency   . Vocal cord dysfunction    Past Surgical History:  Procedure Laterality Date  . ABDOMINAL HYSTERECTOMY  1993  . ANTERIOR CERVICAL DECOMP/DISCECTOMY FUSION  2004  . CARPAL TUNNEL RELEASE Bilateral ?1980's  . CHOLECYSTECTOMY  ?1990  . HIP SURGERY Right 2002   "did something to the tibula band" (02/09/2013)  . KNEE ARTHROSCOPY Left 1990's   "fell; clot behind knee cap had to be removed" (02/09/2013)  . NASAL SINUS SURGERY  1986; 1990's   "i've had 3" (02/09/2013)  . RIGHT/LEFT HEART CATH AND CORONARY ANGIOGRAPHY N/A 08/26/2017   Procedure: RIGHT/LEFT HEART CATH AND CORONARY ANGIOGRAPHY;  Surgeon: Belva Crome, MD;  Location: [facility];  Service: Cardiovascular;  Laterality: N/A;  . SHOULDER ARTHROSCOPY W/ ROTATOR CUFF REPAIR Right    "had to go deep in rotator cuff" (02/09/2013)    reports that she quit smoking about 12 years ago. Her smoking use included cigarettes. She has a 0.30 pack-year smoking history. She has never used smokeless tobacco. She reports that she does not drink alcohol or use drugs. family history includes Breast cancer in her cousin, cousin, and  maternal aunt; Coronary artery disease in her father. Allergies  Allergen Reactions  . Hydromorphone Hcl Itching and Rash    redness  . Influenza Vaccines Anaphylaxis and Swelling    Throat swelling, "flu" symptoms  . Iodinated Diagnostic Agents Shortness Of Breath, Nausea And Vomiting, Nausea Only and Rash  . Iodine-131 Shortness Of Breath, Nausea And Vomiting and Rash  . Iohexol Anaphylaxis, Hives and Swelling     Desc: hives,dyspnea; throat swelling; ok w/ premeds and omnipaque   . Levofloxacin Anaphylaxis and Hives  . Nucynta [Tapentadol Hydrochloride] Other (See Comments)    Hallucinations and  insomnia x3 days  . Pregabalin Other (See Comments)    Reaction-Syncope & unable to speak  . Sulfonamide Derivatives Anaphylaxis  .  Oxycodone-Acetaminophen Rash  . Vilazodone Hcl Other (See Comments)    Hallucinations  . Amoxicillin Hives  . Biaxin [Clarithromycin] Hives  . Carbamazepine Itching  . Ciprofloxacin Hives  . Dilaudid [Hydromorphone Hcl] Itching and Rash    redness  . Iodides Nausea Only  . Percocet [Oxycodone-Acetaminophen] Rash  . Tapentadol Other (See Comments)    unknown      Outpatient Encounter Medications as of 04/22/2018  Medication Sig  . albuterol (PROVENTIL HFA;VENTOLIN HFA) 108 (90 Base) MCG/ACT inhaler Inhale 2 puffs into the lungs every 6 (six) hours as needed for wheezing or shortness of breath.  [name] atorvastatin (LIPITOR) 10 MG tablet Take 10 mg by mouth daily.  [name] BYSTOLIC 10 MG tablet TAKE ONE TABLET BY MOUTH EVERY DAY  . carisoprodol (SOMA) 250 MG tablet Take 350 mg by mouth 2 (two) times daily.  . Cholecalciferol (VITAMIN D3) 1000 units CAPS Take 1,000 Units by mouth daily.  [name] diltiazem (CARDIZEM CD) 240 MG 24 hr capsule TAKE 1 CAPSULE BY MOUTH EVERY MORNING ON AN EMPTY STOMACH - [name] DENIED, FAXED DR Johnsie Cancel 10/22/15 SS  . FLUoxetine (PROZAC) 20 MG capsule Take 20 mg by mouth daily. Prozac 60 mg every evening.  . fluticasone-salmeterol (ADVAIR HFA) 115-21 MCG/ACT inhaler Inhale 2 puffs into the lungs 2 (two) times daily.  [name] gabapentin (NEURONTIN) 300 MG capsule Take 1,200 mg by mouth at bedtime. 4 capsules at bedtime.  [name] HUMULIN R U-500 KWIKPEN 500 UNIT/ML kwikpen Inject 80-140 Units into the skin See admin instructions. 90 units in the am and 50 units every evening.  . hydrochlorothiazide (HYDRODIURIL) 25 MG tablet Take 25 mg by mouth daily.  . hydrOXYzine (ATARAX/VISTARIL) 10 MG tablet Take 10 mg by mouth 3 (three) times daily as needed.  [name] ibuprofen (ADVIL,MOTRIN) 800 MG tablet Take 800 mg by mouth 3 (three) times daily.   [name] ipratropium-albuterol (DUONEB) 0.5-2.5 (3) MG/3ML SOLN Take 4m by nebulization every 4-6 hours if needed (Patient taking differently: Take 3 mLs by  nebulization every 4 (four) hours as needed. Take 374mby nebulization every 4-6 hours if needed)  . levothyroxine (SYNTHROID, LEVOTHROID) 75 MCG tablet Take 75 mcg by mouth daily before breakfast.  . liraglutide (VICTOZA) 18 MG/3ML SOPN Inject 1.2 mg into the skin every evening.   . Marland Kitchenmeprazole (PRILOSEC) 40 MG capsule Take 40 mg by mouth daily.   . [DISCONTINUED] nortriptyline (PAMELOR) 25 MG capsule Take 25 mg by mouth at bedtime.  . [DISCONTINUED] tiZANidine (ZANAFLEX) 4 MG tablet Take 4 mg by mouth 3 (three) times daily.    Facility-Administered Encounter Medications as of 04/22/2018  Medication  . 0.9 %  sodium chloride infusion     REVIEW OF SYSTEMS  : All other systems  reviewed and negative except where noted in the History of Present Illness.   PHYSICAL EXAM: BP (!) 152/90   Pulse 98   Ht 5' 2"  (1.575 m)   Wt 218 lb (98.9 kg)   BMI 39.87 kg/m  General: Well developed white female in no acute distress Head: Normocephalic and atraumatic Eyes:  Sclerae anicteric, conjunctiva pink. Ears: Normal auditory acuity.  Lungs: Clear throughout to auscultation; no increased WOB. Heart: Regular rate and rhythm; no M/R/G. Abdomen: Soft, non-distended.  BS present.  Non-tender. Musculoskeletal: Symmetrical with no gross deformities  Skin: No lesions on visible extremities Extremities: No edema  Neurological: Alert oriented x 4, grossly non-focal Psychological:  Alert and cooperative. Normal mood and affect  ASSESSMENT AND PLAN: *Dysphagia, GERD, hoarseness:  ? If this is even all reflux related.  The dysphagia sounds much more like anxiety.  Will increase omeprazole to 40 mg BID and will schedule for EGD with [name] Plan for evaluation.  If negative then may need ENT evaluation and/or better anxiety control. *IDDML  Insulin will be adjusted prior to endoscopic procedure per protocol. Will resume normal dosing after procedure.  **The risks, benefits, and alternatives to EGD with possible  dilation were discussed with the patient and she consents to proceed.   CC:  Le, Thao P, DO

## 2018-04-22 NOTE — Patient Instructions (Signed)
If you are age 51 or older, your body mass index should be between 23-30. Your Body mass index is 39.87 kg/m. If this is out of the aforementioned range listed, please consider follow up with your Primary Care Provider.  If you are age 25 or younger, your body mass index should be between 19-25. Your Body mass index is 39.87 kg/m. If this is out of the aformentioned range listed, please consider follow up with your Primary Care Provider.   You have been scheduled for an endoscopy. Please follow written instructions given to you at your visit today. If you use inhalers (even only as needed), please bring them with you on the day of your procedure. Your physician has requested that you go to www.startemmi.com and enter the access code given to you at your visit today. This web site gives a general overview about your procedure. However, you should still follow specific instructions given to you by our office regarding your preparation for the procedure.  We have sent the following medications to your pharmacy for you to pick up at your convenience: Pantoprazole  Thank you for choosing me and [facility].   Janett Billow Zehr,PA-C

## 2018-04-28 ENCOUNTER — Encounter: Payer: Self-pay | Admitting: Gastroenterology

## 2018-04-29 ENCOUNTER — Other Ambulatory Visit: Payer: Self-pay | Admitting: Cardiovascular Disease

## 2018-04-30 ENCOUNTER — Encounter: Payer: Self-pay | Admitting: Gastroenterology

## 2018-04-30 DIAGNOSIS — R490 Dysphonia: Secondary | ICD-10-CM | POA: Insufficient documentation

## 2018-04-30 DIAGNOSIS — R1310 Dysphagia, unspecified: Secondary | ICD-10-CM | POA: Insufficient documentation

## 2018-05-02 NOTE — Progress Notes (Signed)
Reviewed and agree with initial management plan.  Malcolm T. Stark, MD FACG 

## 2018-05-12 ENCOUNTER — Encounter: Payer: Self-pay | Admitting: Gastroenterology

## 2018-05-12 ENCOUNTER — Ambulatory Visit (AMBULATORY_SURGERY_CENTER): Payer: Medicare HMO | Admitting: Gastroenterology

## 2018-05-12 VITALS — BP 160/81 | HR 82 | Temp 97.5°F | Resp 16 | Ht 62.0 in | Wt 218.0 lb

## 2018-05-12 DIAGNOSIS — K219 Gastro-esophageal reflux disease without esophagitis: Secondary | ICD-10-CM | POA: Diagnosis not present

## 2018-05-12 DIAGNOSIS — K3184 Gastroparesis: Secondary | ICD-10-CM

## 2018-05-12 MED ORDER — SODIUM CHLORIDE 0.9 % IV SOLN
500.0000 mL | Freq: Once | INTRAVENOUS | Status: DC
Start: 2018-05-12 — End: 2018-05-12

## 2018-05-12 NOTE — Patient Instructions (Signed)
Antireflux measures long term. Gastroparesis diet. Please read handouts provided.     YOU HAD AN ENDOSCOPIC PROCEDURE TODAY AT [facility]:   Refer to the procedure report that was given to you for any specific questions about what was found during the examination.  If the procedure report does not answer your questions, please call your gastroenterologist to clarify.  If you requested that your care partner not be given the details of your procedure findings, then the procedure report has been included in a sealed envelope for you to review at your convenience later.  YOU SHOULD EXPECT: Some feelings of bloating in the abdomen. Passage of more gas than usual.  Walking can help get rid of the air that was put into your GI tract during the procedure and reduce the bloating. If you had a lower endoscopy (such as a colonoscopy or flexible sigmoidoscopy) you may notice spotting of blood in your stool or on the toilet paper. If you underwent a bowel prep for your procedure, you may not have a normal bowel movement for a few days.  Please Note:  You might notice some irritation and congestion in your nose or some drainage.  This is from the oxygen used during your procedure.  There is no need for concern and it should clear up in a day or so.  SYMPTOMS TO REPORT IMMEDIATELY:    Following upper endoscopy (EGD)  Vomiting of blood or coffee ground material  New chest pain or pain under the shoulder blades  Painful or persistently difficult swallowing  New shortness of breath  Fever of 100F or higher  Black, tarry-looking stools  For urgent or emergent issues, a gastroenterologist can be reached at any hour by calling [phone].   DIET:  We do recommend a small meal at first, but then you may proceed to your regular diet.  Drink plenty of fluids but you should avoid alcoholic beverages for 24 hours.  ACTIVITY:  You should plan to take it easy for the rest of today and you  should NOT DRIVE or use heavy machinery until tomorrow (because of the sedation medicines used during the test).    FOLLOW UP: Our staff will call the number listed on your records the next business day following your procedure to check on you and address any questions or concerns that you may have regarding the information given to you following your procedure. If we do not reach you, we will leave a message.  However, if you are feeling well and you are not experiencing any problems, there is no need to return our call.  We will assume that you have returned to your regular daily activities without incident.  If any biopsies were taken you will be contacted by phone or by letter within the next 1-3 weeks.  Please call us at [phone] if you have not heard about the biopsies in 3 weeks.    SIGNATURES/CONFIDENTIALITY: You and/or your care partner have signed paperwork which will be entered into your electronic medical record.  These signatures attest to the fact that that the information above on your After Visit Summary has been reviewed and is understood.  Full responsibility of the confidentiality of this discharge information lies with you and/or your care-partner.

## 2018-05-12 NOTE — Op Note (Signed)
[facility] Patient Name: Cheryl [name] Procedure Date: 05/12/2018 10:28 AM MRN: [id number] Endoscopist: Ladene Artist , MD Age: 52 Referring MD:  Date of Birth: 15-Mar-1966 Gender: Female Account #: [id number] Procedure:                Upper GI endoscopy Indications:              Gastroesophageal reflux disease Medicines:                Monitored Anesthesia Care Procedure:                Pre-Anesthesia Assessment:                           - Prior to the procedure, a History and Physical                            was performed, and patient medications and                            allergies were reviewed. The patient's tolerance of                            previous anesthesia was also reviewed. The risks                            and benefits of the procedure and the sedation                            options and risks were discussed with the patient.                            All questions were answered, and informed consent                            was obtained. Prior Anticoagulants: The patient has                            taken no previous anticoagulant or antiplatelet                            agents. ASA Grade Assessment: III - A patient with                            severe systemic disease. After reviewing the risks                            and benefits, the patient was deemed in                            satisfactory condition to undergo the procedure.                           After obtaining informed consent, the endoscope was  passed under direct vision. Throughout the                            procedure, the patient's blood pressure, pulse, and                            oxygen saturations were monitored continuously. The                            Model GIF-HQ190 [id number]) scope was introduced                            through the mouth, and advanced to the second part                            of duodenum.  The upper GI endoscopy was                            accomplished without difficulty. The patient                            tolerated the procedure well. Scope In: Scope Out: Findings:                 The examined esophagus was normal.                           A large amount of food (residue) was found in the                            gastric fundus, in the gastric body and in the                            gastric antrum.                           The exam of the stomach was otherwise normal.                           The duodenal bulb and second portion of the                            duodenum were normal. Complications:            No immediate complications. Estimated Blood Loss:     Estimated blood loss: none. Impression:               - Normal esophagus.                           - A large amount of food (residue) in the stomach.                            This obscured the view of over half of the gastric  mucosa.                           - Normal duodenal bulb and second portion of the                            duodenum.                           - No specimens collected. Recommendation:           - Patient has a contact number available for                            emergencies. The signs and symptoms of potential                            delayed complications were discussed with the                            patient. Return to normal activities tomorrow.                            Written discharge instructions were provided to the                            patient.                           - Antireflux measures long term.                           - Gastroparesis diet.                           - Continue present medications.                           - Gastric emptying study at appointment to be                            scheduled. Ladene Artist, MD 05/12/2018 10:57:39 AM This report has been signed electronically.

## 2018-05-12 NOTE — Progress Notes (Signed)
A/ox3 pleased with MAC, report to RN 

## 2018-05-13 ENCOUNTER — Other Ambulatory Visit: Payer: Self-pay

## 2018-05-13 ENCOUNTER — Telehealth: Payer: Self-pay

## 2018-05-13 DIAGNOSIS — K3184 Gastroparesis: Secondary | ICD-10-CM

## 2018-05-13 DIAGNOSIS — K219 Gastro-esophageal reflux disease without esophagitis: Secondary | ICD-10-CM

## 2018-05-13 NOTE — Telephone Encounter (Signed)
  Follow up Call-  Call back number 05/12/2018 12/31/2017  Post procedure Call Back phone  # [phone]  Permission to leave phone message Yes Yes  Some recent data might be hidden     Patient questions:  Do you have a fever, pain , or abdominal swelling? No. Pain Score  0 *  Have you tolerated food without any problems? Yes.    Have you been able to return to your normal activities? Yes.    Do you have any questions about your discharge instructions: Diet   No. Medications  No. Follow up visit  No.  Do you have questions or concerns about your Care? No.  Actions: * If pain score is 4 or above: No action needed, pain <4.

## 2018-05-13 NOTE — Progress Notes (Signed)
Patient notified of GES on 05/20/18 7:00 am arrival.  She is notified to hold her prilosec and be NPO after midnight .

## 2018-05-19 ENCOUNTER — Ambulatory Visit: Payer: Medicare HMO | Admitting: Gastroenterology

## 2018-05-20 ENCOUNTER — Encounter (HOSPITAL_COMMUNITY)
Admission: RE | Admit: 2018-05-20 | Discharge: 2018-05-20 | Disposition: A | Discharge status: Home / Self Care | Payer: Medicare HMO | Source: Ambulatory Visit | Attending: Gastroenterology | Admitting: Gastroenterology

## 2018-05-20 DIAGNOSIS — K219 Gastro-esophageal reflux disease without esophagitis: Secondary | ICD-10-CM | POA: Insufficient documentation

## 2018-05-20 DIAGNOSIS — K3184 Gastroparesis: Secondary | ICD-10-CM | POA: Insufficient documentation

## 2018-05-20 DIAGNOSIS — R6881 Early satiety: Secondary | ICD-10-CM | POA: Diagnosis not present

## 2018-05-20 MED ORDER — TECHNETIUM TC 99M SULFUR COLLOID
2.1000 | Freq: Once | INTRAVENOUS | Status: AC | PRN
  Administered 2018-05-20: 2.1 via INTRAVENOUS

## 2018-05-21 DIAGNOSIS — D1801 Hemangioma of skin and subcutaneous tissue: Secondary | ICD-10-CM | POA: Diagnosis not present

## 2018-05-21 DIAGNOSIS — L570 Actinic keratosis: Secondary | ICD-10-CM | POA: Diagnosis not present

## 2018-05-21 DIAGNOSIS — Z85820 Personal history of malignant melanoma of skin: Secondary | ICD-10-CM | POA: Diagnosis not present

## 2018-05-21 DIAGNOSIS — L814 Other melanin hyperpigmentation: Secondary | ICD-10-CM | POA: Diagnosis not present

## 2018-05-21 DIAGNOSIS — D225 Melanocytic nevi of trunk: Secondary | ICD-10-CM | POA: Diagnosis not present

## 2018-05-21 DIAGNOSIS — D2271 Melanocytic nevi of right lower limb, including hip: Secondary | ICD-10-CM | POA: Diagnosis not present

## 2018-05-21 DIAGNOSIS — L821 Other seborrheic keratosis: Secondary | ICD-10-CM | POA: Diagnosis not present

## 2018-05-21 DIAGNOSIS — L820 Inflamed seborrheic keratosis: Secondary | ICD-10-CM | POA: Diagnosis not present

## 2018-05-21 DIAGNOSIS — L812 Freckles: Secondary | ICD-10-CM | POA: Diagnosis not present

## 2018-05-26 DIAGNOSIS — R748 Abnormal levels of other serum enzymes: Secondary | ICD-10-CM | POA: Diagnosis not present

## 2018-05-26 DIAGNOSIS — G894 Chronic pain syndrome: Secondary | ICD-10-CM | POA: Diagnosis not present

## 2018-05-26 DIAGNOSIS — K224 Dyskinesia of esophagus: Secondary | ICD-10-CM | POA: Diagnosis not present

## 2018-05-26 DIAGNOSIS — M797 Fibromyalgia: Secondary | ICD-10-CM | POA: Diagnosis not present

## 2018-05-26 DIAGNOSIS — G4489 Other headache syndrome: Secondary | ICD-10-CM | POA: Diagnosis not present

## 2018-05-26 DIAGNOSIS — E1142 Type 2 diabetes mellitus with diabetic polyneuropathy: Secondary | ICD-10-CM | POA: Diagnosis not present

## 2018-05-26 DIAGNOSIS — Z794 Long term (current) use of insulin: Secondary | ICD-10-CM | POA: Diagnosis not present

## 2018-05-26 DIAGNOSIS — R253 Fasciculation: Secondary | ICD-10-CM | POA: Diagnosis not present

## 2018-05-31 ENCOUNTER — Other Ambulatory Visit: Payer: Self-pay | Admitting: Family Medicine

## 2018-06-01 ENCOUNTER — Ambulatory Visit: Payer: Medicare HMO | Admitting: Pulmonary Disease

## 2018-06-01 ENCOUNTER — Other Ambulatory Visit: Payer: Self-pay | Admitting: Family Medicine

## 2018-06-01 DIAGNOSIS — G4489 Other headache syndrome: Secondary | ICD-10-CM

## 2018-06-17 ENCOUNTER — Ambulatory Visit
Admission: RE | Admit: 2018-06-17 | Discharge: 2018-06-17 | Disposition: A | Discharge status: Home / Self Care | Payer: Medicare HMO | Source: Ambulatory Visit | Attending: Family Medicine | Admitting: Family Medicine

## 2018-06-17 DIAGNOSIS — G4489 Other headache syndrome: Secondary | ICD-10-CM

## 2018-07-25 DIAGNOSIS — J45909 Unspecified asthma, uncomplicated: Secondary | ICD-10-CM | POA: Diagnosis not present

## 2018-07-28 ENCOUNTER — Other Ambulatory Visit: Payer: Self-pay | Admitting: Gastroenterology

## 2018-08-05 DIAGNOSIS — J45909 Unspecified asthma, uncomplicated: Secondary | ICD-10-CM | POA: Diagnosis not present

## 2018-08-05 DIAGNOSIS — J069 Acute upper respiratory infection, unspecified: Secondary | ICD-10-CM | POA: Diagnosis not present

## 2018-08-05 DIAGNOSIS — R05 Cough: Secondary | ICD-10-CM | POA: Diagnosis not present

## 2018-08-26 DIAGNOSIS — I10 Essential (primary) hypertension: Secondary | ICD-10-CM | POA: Diagnosis not present

## 2018-08-26 DIAGNOSIS — Z6837 Body mass index (BMI) 37.0-37.9, adult: Secondary | ICD-10-CM | POA: Diagnosis not present

## 2018-08-26 DIAGNOSIS — M797 Fibromyalgia: Secondary | ICD-10-CM | POA: Diagnosis not present

## 2018-10-20 ENCOUNTER — Other Ambulatory Visit: Payer: Self-pay | Admitting: Gastroenterology

## 2018-10-20 ENCOUNTER — Other Ambulatory Visit: Payer: Self-pay | Admitting: Cardiovascular Disease

## 2018-10-20 DIAGNOSIS — G894 Chronic pain syndrome: Secondary | ICD-10-CM | POA: Diagnosis not present

## 2018-10-20 DIAGNOSIS — G43009 Migraine without aura, not intractable, without status migrainosus: Secondary | ICD-10-CM | POA: Diagnosis not present

## 2018-10-20 DIAGNOSIS — M797 Fibromyalgia: Secondary | ICD-10-CM | POA: Diagnosis not present

## 2018-10-20 DIAGNOSIS — M62838 Other muscle spasm: Secondary | ICD-10-CM | POA: Diagnosis not present

## 2018-12-23 DIAGNOSIS — F419 Anxiety disorder, unspecified: Secondary | ICD-10-CM | POA: Diagnosis not present

## 2018-12-23 DIAGNOSIS — G894 Chronic pain syndrome: Secondary | ICD-10-CM | POA: Diagnosis not present

## 2018-12-23 DIAGNOSIS — E1142 Type 2 diabetes mellitus with diabetic polyneuropathy: Secondary | ICD-10-CM | POA: Diagnosis not present

## 2018-12-23 DIAGNOSIS — E039 Hypothyroidism, unspecified: Secondary | ICD-10-CM | POA: Diagnosis not present

## 2018-12-23 DIAGNOSIS — M797 Fibromyalgia: Secondary | ICD-10-CM | POA: Diagnosis not present

## 2018-12-23 DIAGNOSIS — K219 Gastro-esophageal reflux disease without esophagitis: Secondary | ICD-10-CM | POA: Diagnosis not present

## 2018-12-23 DIAGNOSIS — G43009 Migraine without aura, not intractable, without status migrainosus: Secondary | ICD-10-CM | POA: Diagnosis not present

## 2018-12-23 DIAGNOSIS — G629 Polyneuropathy, unspecified: Secondary | ICD-10-CM | POA: Diagnosis not present

## 2018-12-23 DIAGNOSIS — I10 Essential (primary) hypertension: Secondary | ICD-10-CM | POA: Diagnosis not present

## 2019-01-03 DIAGNOSIS — Z124 Encounter for screening for malignant neoplasm of cervix: Secondary | ICD-10-CM | POA: Diagnosis not present

## 2019-01-03 DIAGNOSIS — Z1272 Encounter for screening for malignant neoplasm of vagina: Secondary | ICD-10-CM | POA: Diagnosis not present

## 2019-01-03 DIAGNOSIS — N958 Other specified menopausal and perimenopausal disorders: Secondary | ICD-10-CM | POA: Diagnosis not present

## 2019-01-03 DIAGNOSIS — R102 Pelvic and perineal pain: Secondary | ICD-10-CM | POA: Diagnosis not present

## 2019-01-03 DIAGNOSIS — Z6836 Body mass index (BMI) 36.0-36.9, adult: Secondary | ICD-10-CM | POA: Diagnosis not present

## 2019-01-03 DIAGNOSIS — Z90710 Acquired absence of both cervix and uterus: Secondary | ICD-10-CM | POA: Diagnosis not present

## 2019-01-03 DIAGNOSIS — N3010 Interstitial cystitis (chronic) without hematuria: Secondary | ICD-10-CM | POA: Diagnosis not present

## 2019-01-17 DIAGNOSIS — E782 Mixed hyperlipidemia: Secondary | ICD-10-CM | POA: Diagnosis not present

## 2019-01-17 DIAGNOSIS — E1142 Type 2 diabetes mellitus with diabetic polyneuropathy: Secondary | ICD-10-CM | POA: Diagnosis not present

## 2019-01-17 DIAGNOSIS — F418 Other specified anxiety disorders: Secondary | ICD-10-CM | POA: Diagnosis not present

## 2019-01-17 DIAGNOSIS — E039 Hypothyroidism, unspecified: Secondary | ICD-10-CM | POA: Diagnosis not present

## 2019-01-17 DIAGNOSIS — F439 Reaction to severe stress, unspecified: Secondary | ICD-10-CM | POA: Diagnosis not present

## 2019-01-17 DIAGNOSIS — Z794 Long term (current) use of insulin: Secondary | ICD-10-CM | POA: Diagnosis not present

## 2019-01-19 ENCOUNTER — Other Ambulatory Visit: Payer: Self-pay | Admitting: Internal Medicine

## 2019-01-26 ENCOUNTER — Other Ambulatory Visit: Payer: Self-pay | Admitting: Cardiovascular Disease

## 2019-01-26 MED ORDER — NEBIVOLOL HCL 10 MG PO TABS: 10.0000 mg | ORAL_TABLET | Freq: Every day | ORAL | 0 refills | Status: DC

## 2019-02-15 ENCOUNTER — Other Ambulatory Visit: Payer: Self-pay | Admitting: Cardiovascular Disease

## 2019-02-15 ENCOUNTER — Telehealth: Payer: Self-pay | Admitting: Cardiology

## 2019-02-15 NOTE — Telephone Encounter (Signed)
New Message ° ° ° °Left message to confirm appt and answer covid questions  °

## 2019-02-16 ENCOUNTER — Ambulatory Visit: Payer: Medicare HMO | Admitting: Cardiology

## 2019-02-23 ENCOUNTER — Telehealth: Payer: Self-pay | Admitting: *Deleted

## 2019-02-23 NOTE — Telephone Encounter (Signed)
Pt answered NO the questions below:      COVID-19 Pre-Screening Questions:  . In the past 7 to 10 days have you had a cough,  shortness of breath, headache, congestion, fever (100 or greater) body aches, chills, sore throat, or sudden loss of taste or sense of smell? . Have you been around anyone with known Covid 19. . Have you been around anyone who is awaiting Covid 19 test results in the past 7 to 10 days? . Have you been around anyone who has been exposed to Covid 19, or has mentioned symptoms of Covid 19 within the past 7 to 10 days?  If you have any concerns/questions about symptoms patients report during screening (either on the phone or at threshold). Contact the provider seeing the patient or DOD for further guidance.  If neither are available contact a member of the leadership team.

## 2019-02-24 ENCOUNTER — Other Ambulatory Visit: Payer: Self-pay

## 2019-02-24 ENCOUNTER — Ambulatory Visit (INDEPENDENT_AMBULATORY_CARE_PROVIDER_SITE_OTHER): Payer: Medicare HMO | Admitting: Cardiology

## 2019-02-24 ENCOUNTER — Encounter: Payer: Self-pay | Admitting: Cardiology

## 2019-02-24 VITALS — BP 130/90 | HR 73 | Ht 65.0 in | Wt 197.8 lb

## 2019-02-24 DIAGNOSIS — I10 Essential (primary) hypertension: Secondary | ICD-10-CM | POA: Diagnosis not present

## 2019-02-24 DIAGNOSIS — Z136 Encounter for screening for cardiovascular disorders: Secondary | ICD-10-CM | POA: Diagnosis not present

## 2019-02-24 DIAGNOSIS — Z0000 Encounter for general adult medical examination without abnormal findings: Secondary | ICD-10-CM | POA: Diagnosis not present

## 2019-02-24 NOTE — Patient Instructions (Signed)
Medication Instructions:  none If you need a refill on your cardiac medications before your next appointment, please call your pharmacy.   Lab work: none If you have labs (blood work) drawn today and your tests are completely normal, you will receive your results only by: [name] MyChart Message (if you have MyChart) OR . A paper copy in the mail If you have any lab test that is abnormal or we need to change your treatment, we will call you to review the results.  Testing/Procedures: none  Follow-Up:  As Needed At [facility], you and your health needs are our priority.  As part of our continuing mission to provide you with exceptional heart care, we have created designated Provider Care Teams.  These Care Teams include your primary Cardiologist (physician) and Advanced Practice Providers (APPs -  Physician Assistants and Nurse Practitioners) who all work together to provide you with the care you need, when you need it. .   Any Other Special Instructions Will Be Listed Below (If Applicable).

## 2019-02-24 NOTE — Progress Notes (Signed)
02/24/2019 Cheryl [name]   Feb 26, 1966  [id number]  Primary Physician Camille Bal, PA-C Primary Cardiologist: Dr. Johnsie Cancel  Electrophysiologist: None   Reason for Visit/CC: f/u for cardiovascular examination.   HPI:  Cheryl [name] is a 53 y.o. female who is being seen today for cardiac examination. She needs clearance to get her drivers licence reinstated.  She reports that she quit driving many years ago when she was dealing with other medical problems and was on multiple medications that affected her ability to drive.  She reports that many of these medications were discontinued.  She is also seeking clearance from her primary care physician.  She has no history of cardiogenic syncope.  She does not have an ICD.  She has been followed by Dr. Johnsie Cancel.   Cardiac risk factors include poorly controlled IDDM. Also h/o HLD but patient admits that she self discontinued her Lipitor previously, because it "saved her $7.00". She also has HTN that is treated as well as a family h/o premature CAD (father w/ MI in early 78s). She denies recent tobacco use. She is a former smoker.  She has known coronary artery calcifications, noted on previous chest CT. Mild calcification of distal LM/ and LAD noted. Echo in 2016 showed normal LVEF and no significant valvular disease.   In January 2019 she was evaluated for chest pain and was set up for cardiac catheterization.  Cardiac cath showed normal coronary arteries, normal right heart pressures and normal left ventricular EF at 65%.  Today in clinic, she reports that she has felt well from a cardiac standpoint.  She denies any chest pain.  No dyspnea.  Previously she had had issues with palpitations (PVCs) but this has resolved.  She denies any syncope/near syncope.  She has been under more stress here recently.  Her father recently passed away last week and her mother does not drive.  Her mother was very dependent on her father and she, the patient, is  wanting to help her mother out more which is the main reason for wanting to reinstate her license.  Cardiac Studies  Procedures [facility] 07/2017  RIGHT/LEFT HEART CATH AND CORONARY ANGIOGRAPHY  Conclusion   Normal coronary arteries.  Right dominant coronary anatomy.  Normal right heart hemodynamics and pressures.  Normal left ventricular function with ejection fraction 65%.  Normal hemodynamics.    Left Heart  Left Ventricle The left ventricular size is normal. The left ventricular systolic function is normal. LV end diastolic pressure is normal. The left ventricular ejection fraction is greater than 65% by visual estimate. No regional wall motion abnormalities. There is no evidence of mitral regurgitation.  Coronary Diagrams  Diagnostic Dominance: Right  Intervention    2D Echo 2016  Study Conclusions  - Left ventricle: The cavity size was normal. Systolic function was   normal. The estimated ejection fraction was in the range of 60%   to 65%. Wall motion was normal; there were no regional wall   motion abnormalities. Doppler parameters are consistent with   abnormal left ventricular relaxation (grade 1 diastolic   dysfunction). - Left atrium: The atrium was mildly dilated. - Right ventricle: Systolic function was normal. - Right atrium: The atrium was normal in size. - Tricuspid valve: Structurally normal valve. There was trivial   regurgitation. - Pulmonic valve: There was trivial regurgitation. - Pulmonary arteries: Systolic pressure was within the normal   range. - Inferior vena cava: The vessel was normal in size. - Pericardium, extracardiac:  There was no pericardial effusion.  Impressions:  - Normal biventricular size and systolic function.   Abnormal relaxation, normal filling pressures.   No significant valvular abnormalities.   Current Meds  Medication Sig  . albuterol (PROVENTIL HFA;VENTOLIN HFA) 108 (90 Base) MCG/ACT inhaler Inhale 2 puffs into the  lungs every 6 (six) hours as needed for wheezing or shortness of breath.  [name] atorvastatin (LIPITOR) 10 MG tablet Take 10 mg by mouth daily.  . Cholecalciferol (VITAMIN D3) 1000 units CAPS Take 1,000 Units by mouth daily.  . diazepam (VALIUM) 10 MG tablet Take 10 mg by mouth daily.  [name] diltiazem (CARDIZEM CD) 240 MG 24 hr capsule TAKE 1 CAPSULE BY MOUTH EVERY MORNING ON AN EMPTY STOMACH - [name] DENIED, FAXED DR Johnsie Cancel 10/22/15 SS  . FLUoxetine (PROZAC) 40 MG capsule Take 40 mg by mouth daily.  . fluticasone-salmeterol (ADVAIR HFA) 115-21 MCG/ACT inhaler Inhale 2 puffs into the lungs 2 (two) times daily.  [name] gabapentin (NEURONTIN) 300 MG capsule Take 1,200 mg by mouth at bedtime. 4 capsules at bedtime.  [name] HUMULIN R U-500 KWIKPEN 500 UNIT/ML kwikpen Inject 80-140 Units into the skin See admin instructions. 90 units in the am and 50 units every evening.  . hydrochlorothiazide (HYDRODIURIL) 25 MG tablet Take 25 mg by mouth daily.  . hydrOXYzine (ATARAX/VISTARIL) 10 MG tablet Take 10 mg by mouth 3 (three) times daily as needed.  [name] ibuprofen (ADVIL,MOTRIN) 800 MG tablet Take 800 mg by mouth 3 (three) times daily.   [name] ipratropium-albuterol (DUONEB) 0.5-2.5 (3) MG/3ML SOLN Take 30m by nebulization every 4-6 hours if needed  . levothyroxine (SYNTHROID, LEVOTHROID) 75 MCG tablet Take 75 mcg by mouth daily before breakfast.  . liraglutide (VICTOZA) 18 MG/3ML SOPN Inject 1.2 mg into the skin every evening.   . nebivolol (BYSTOLIC) 10 MG tablet Take 1 tablet (10 mg total) by mouth daily. Please make overdue appt with Dr. NJohnsie Cancelbefore anymore refills. 1st attempt  . omeprazole (PRILOSEC) 40 MG capsule Take 1 capsule (40 mg total) by mouth 2 (two) times daily. MUST HAVE OFFICE VISIT FOR FURTHER REFILLS  . tiZANidine (ZANAFLEX) 4 MG tablet Take 4 mg by mouth 3 (three) times daily.   Allergies  Allergen Reactions  . Hydromorphone Hcl Itching and Rash    redness  . Influenza Vaccines Anaphylaxis and Swelling     Throat swelling, "flu" symptoms  . Iodinated Diagnostic Agents Shortness Of Breath, Nausea And Vomiting, Nausea Only and Rash  . Iodine-131 Shortness Of Breath, Nausea And Vomiting and Rash  . Iohexol Anaphylaxis, Hives and Swelling     Desc: hives,dyspnea; throat swelling; ok w/ premeds and omnipaque   . Levofloxacin Anaphylaxis and Hives  . Nucynta [Tapentadol Hydrochloride] Other (See Comments)    Hallucinations and  insomnia x3 days  . Pregabalin Other (See Comments)    Reaction-Syncope & unable to speak  . Sulfonamide Derivatives Anaphylaxis  . Oxycodone-Acetaminophen Rash  . Vilazodone Hcl Other (See Comments)    Hallucinations  . Amoxicillin Hives  . Biaxin [Clarithromycin] Hives  . Carbamazepine Itching  . Ciprofloxacin Hives  . Dilaudid [Hydromorphone Hcl] Itching and Rash    redness  . Iodides Nausea Only  . Percocet [Oxycodone-Acetaminophen] Rash  . Tapentadol Other (See Comments)    unknown   Past Medical History:  Diagnosis Date  . Allergy   . Anxiety   . Asthma   [place]an[place] Left arm   . Chronic  airway obstruction, not elsewhere classified   . Chronic pain    "q where; herniated disc in my tailbone" (02/09/2013)  . DDD (degenerative disc disease)    CERVIAL AND LUMBAR  . Depression   . Edema   . Fatty liver disease, nonalcoholic   . Fibromyalgia    "severe" (02/09/2013)  . Gastroparesis    from DM and chronic narcotic use  . GERD (gastroesophageal reflux disease)   . Human parvovirus infection   . Hyperlipidemia   . Hypertension   . Hypothyroidism   . Interstitial cystitis   . Migraine headache    "weekly; worse lately" (02/09/2013)  . OSA (obstructive sleep apnea)    "have mask;; don't use it; no one came out to check it" (02/09/2013)  . Osteoporosis   . Peripheral neuropathy    "severe" (02/09/2013)  . 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 PONV (postoperative nausea and  vomiting)   . Positive PPD   . Shortness of breath    "at anytime; it's gotten worse recently" (02/09/2013)  . Sleep apnea   . Type II diabetes mellitus ([place])   . Vitamin D deficiency   . Vocal cord dysfunction    Family History  Problem Relation Age of Onset  . Coronary artery disease Father   . Diabetes Brother   . Diabetes Brother   . Breast cancer Maternal Aunt   . Breast cancer Cousin   . Breast cancer Cousin   . Colon cancer Neg Hx   . Esophageal cancer Neg Hx   . Liver cancer Neg Hx   . Pancreatic cancer Neg Hx   . Rectal cancer Neg Hx   . Stomach cancer Neg Hx    Past Surgical History:  Procedure Laterality Date  . ABDOMINAL HYSTERECTOMY  1993  . ANTERIOR CERVICAL DECOMP/DISCECTOMY FUSION  2004  . arm surgery     left 2018  . CARPAL TUNNEL RELEASE Bilateral ?1980's  . CHOLECYSTECTOMY  ?1990  . COLONOSCOPY    . HIP SURGERY Right 2002   "did something to the tibula band" (02/09/2013)  . KNEE ARTHROSCOPY Left 1990's   "fell; clot behind knee cap had to be removed" (02/09/2013)  . NASAL SINUS SURGERY  1986; 1990's   "i've had 3" (02/09/2013)  . RIGHT/LEFT HEART CATH AND CORONARY ANGIOGRAPHY N/A 08/26/2017   Procedure: RIGHT/LEFT HEART CATH AND CORONARY ANGIOGRAPHY;  Surgeon: Belva Crome, MD;  Location: [facility];  Service: Cardiovascular;  Laterality: N/A;  . SHOULDER ARTHROSCOPY W/ ROTATOR CUFF REPAIR Right    "had to go deep in rotator cuff" (02/09/2013)   Social History   Socioeconomic History  . Marital status: Divorced    Spouse name: Not on file  . Number of children: Not on file  . Years of education: Not on file  . Highest education level: Not on file  Occupational History  . Not on file  Social Needs  . Financial resource strain: Not on file  . Food insecurity    Worry: Not on file    Inability: Not on file  . Transportation needs    Medical: Not on file    Non-medical: Not on file  Tobacco Use  . Smoking status: Former Smoker     Packs/day: 0.10    Years: 3.00    Pack years: 0.30    Types: Cigarettes    Quit date: 07/28/2005    Years since quitting: 13.5  .  Smokeless tobacco: Never Used  Substance and Sexual Activity  . Alcohol use: No  . Drug use: No  . Sexual activity: Never  Lifestyle  . Physical activity    Days per week: Not on file    Minutes per session: Not on file  . Stress: Not on file  Relationships  . Social Herbalist on phone: Not on file    Gets together: Not on file    Attends religious service: Not on file    Active member of club or organization: Not on file    Attends meetings of clubs or organizations: Not on file    Relationship status: Not on file  . Intimate partner violence    Fear of current or ex partner: Not on file    Emotionally abused: Not on file    Physically abused: Not on file    Forced sexual activity: Not on file  Other Topics Concern  . Not on file  Social History Narrative   Lives with daughter in a one story home.  On disability.  Education: college.       Lipid Panel     Component Value Date/Time   CHOL (H) 02/26/2008 1826    442        ATP III CLASSIFICATION:  <200     mg/dL   Desirable  200-239  mg/dL   Borderline High  >=240    mg/dL   High   TRIG 1038 RESULTS CONFIRMED BY MANUAL DILUTION (H) 02/26/2008 1826   HDL 41 02/26/2008 1826   CHOLHDL 10.8 02/26/2008 1826   VLDL UNABLE TO CALCULATE IF TRIGLYCERIDE OVER 400 mg/dL 02/26/2008 1826   LDLCALC  02/26/2008 1826    UNABLE TO CALCULATE IF TRIGLYCERIDE OVER 400 mg/dL        Total Cholesterol/HDL:CHD Risk Coronary Heart Disease Risk Table                     Men   Women  1/2 Average Risk   3.4   3.3    Review of Systems: General: negative for chills, fever, night sweats or weight changes.  Cardiovascular: negative for chest pain, dyspnea on exertion, edema, orthopnea, palpitations, paroxysmal nocturnal dyspnea or shortness of breath Dermatological: negative for rash Respiratory:  negative for cough or wheezing Urologic: negative for hematuria Abdominal: negative for nausea, vomiting, diarrhea, bright red blood per rectum, melena, or hematemesis Neurologic: negative for visual changes, syncope, or dizziness All other systems reviewed and are otherwise negative except as noted above.  Okay Physical Exam:  Blood pressure 130/90, pulse 73, weight 197 lb 12.8 oz (89.7 kg), SpO2 98 %.  General appearance: alert, cooperative and no distress Neck: no carotid bruit and no JVD Lungs: clear to auscultation bilaterally Heart: regular rate and rhythm, S1, S2 normal, no murmur, click, rub or gallop Extremities: extremities normal, atraumatic, no cyanosis or edema Pulses: 2+ and symmetric Skin: Skin color, texture, turgor normal. No rashes or lesions Neurologic: Grossly normal  EKG NSR 73 bpm  -- personally reviewed   ASSESSMENT AND PLAN:   1. Cardiovascular Examination: Chart reviewed.  Patient had cardiac catheterization January 2019 that showed normal coronary arteries and normal LV function.  Echocardiogram in 2016 also showed normal LV function and no significant valvular disease.  She denies any chest pain.  No dyspnea.  There is no history of syncope/near syncope.  Physical exam reveals regular rate and rhythm.  No murmurs.  No edema.  No carotid bruits.  EKG today shows normal sinus rhythm.  Heart rate 73 bpm.  No ischemic abnormalities. Blood pressure 130/90.  Based on my assessment,  I feel the patient has no cardiac conditions that would prohibit her from obtaining a driver's license.  She will however need to seek additional clearance from her primary care provider as well for her other conditions that we do not follow.  At this point, I recommend that she continue aggressive risk factor modification as she does has a history of diabetes and hypertension.  These conditions are being followed/managed by her PCP.  She can follow-up with cardiology as needed.   Follow-Up:  PRN   Nelida Gores, MHS [facility] 02/24/2019 12:30 PM

## 2019-03-03 DIAGNOSIS — N3010 Interstitial cystitis (chronic) without hematuria: Secondary | ICD-10-CM | POA: Diagnosis not present

## 2019-03-11 DIAGNOSIS — H5213 Myopia, bilateral: Secondary | ICD-10-CM | POA: Diagnosis not present

## 2019-03-11 DIAGNOSIS — E119 Type 2 diabetes mellitus without complications: Secondary | ICD-10-CM | POA: Diagnosis not present

## 2019-03-28 DIAGNOSIS — E1142 Type 2 diabetes mellitus with diabetic polyneuropathy: Secondary | ICD-10-CM | POA: Diagnosis not present

## 2019-03-28 DIAGNOSIS — E1165 Type 2 diabetes mellitus with hyperglycemia: Secondary | ICD-10-CM | POA: Diagnosis not present

## 2019-03-28 DIAGNOSIS — Z794 Long term (current) use of insulin: Secondary | ICD-10-CM | POA: Diagnosis not present

## 2019-03-28 DIAGNOSIS — E039 Hypothyroidism, unspecified: Secondary | ICD-10-CM | POA: Diagnosis not present

## 2019-03-28 DIAGNOSIS — E782 Mixed hyperlipidemia: Secondary | ICD-10-CM | POA: Diagnosis not present

## 2019-03-29 ENCOUNTER — Other Ambulatory Visit: Payer: Self-pay | Admitting: Cardiovascular Disease

## 2019-04-05 DIAGNOSIS — J3489 Other specified disorders of nose and nasal sinuses: Secondary | ICD-10-CM | POA: Diagnosis not present

## 2019-05-13 DIAGNOSIS — J069 Acute upper respiratory infection, unspecified: Secondary | ICD-10-CM | POA: Diagnosis not present

## 2019-05-26 ENCOUNTER — Telehealth (INDEPENDENT_AMBULATORY_CARE_PROVIDER_SITE_OTHER): Payer: Medicare HMO | Admitting: Pulmonary Disease

## 2019-05-26 ENCOUNTER — Encounter: Payer: Self-pay | Admitting: Pulmonary Disease

## 2019-05-26 DIAGNOSIS — J181 Lobar pneumonia, unspecified organism: Secondary | ICD-10-CM

## 2019-05-26 DIAGNOSIS — J45909 Unspecified asthma, uncomplicated: Secondary | ICD-10-CM

## 2019-05-26 DIAGNOSIS — R509 Fever, unspecified: Secondary | ICD-10-CM

## 2019-05-26 MED ORDER — CEFPODOXIME PROXETIL 200 MG PO TABS: 200.0000 mg | ORAL_TABLET | Freq: Two times a day (BID) | ORAL | 0 refills | Status: DC

## 2019-05-26 MED ORDER — AZITHROMYCIN 250 MG PO TABS: ORAL_TABLET | ORAL | 0 refills | Status: DC

## 2019-05-26 NOTE — Assessment & Plan Note (Signed)
Plan: Obtain outpatient Covid testing, you preferred to have this completed at [facility] family medicine It is your responsibility to have that information faxed over to our office prior to being seen in office in 2 weeks

## 2019-05-26 NOTE — Assessment & Plan Note (Signed)
Likely patient has pneumonia Patient is visibly fatigued with low-grade fevers as well as loss of appetite Patient has had 2 rounds of antibiotic therapy since September/2020 a Z-Pak in September, doxycycline in October  Plan: Vantin 200 mg twice daily for 7 days Azithromycin-Z-Pak today Patient to obtain outpatient Covid testing, ordered for our system but patient would prefer to complete at [facility] family medicine Patient likely would benefit from a chest x-ray if she is indeed Covid negative Patient would also benefit from sputum cultures if she is indeed Covid negative >>>We can evaluate this after after we have a negative Covid test result  Repeated to the patient multiple times today that if fevers worsen, if she is unable to tolerate p.o. foods and liquids, or shortness of breath and cough worsen she needs to seek emergent evaluation as I believe she likely has a difficult to treat pneumonia given her symptoms.

## 2019-05-26 NOTE — Progress Notes (Signed)
Virtual Visit via Video Note  I connected with Cheryl [name] on 05/26/19 at 10:00 AM EDT by a video enabled telemedicine application and verified that I am speaking with the correct person using two identifiers.  Location: Patient: Home Provider: Office - [facility] - [address], [address], [place], [place] [address]  I discussed the limitations of evaluation and management by telemedicine and the availability of in person appointments. The patient expressed understanding and agreed to proceed. I also discussed with the patient that there may be a patient responsible charge related to this service. The patient expressed understanding and agreed to proceed.  Patient consented to consult via telephone: Yes People present and their role in pt care: Pt   History of Present Illness:  53 year old female former smoker followed in our office for asthma, allergic rhinitis and periodic limb movement for sleep  Past medical history: Diabetes, GERD, fibromyalgia, aortic and coronary arthrosclerosis, hypertension, chronic fatigue syndrome and hypothyroidism Smoking history: Former smoker Maintenance: Advair 115 Patient of Dr. [name]  Chief complaint: Cough   53 year old female former smoker completing a televisit with our office today for worsening cough.  Patient is visibly fatigued, constantly coughing, and looks very drained on exam today.  Patient reporting today that she was treated by her primary care doctor in September with a Z-Pak and then later in October with doxycycline a 10-day course.  Primary care recommended Covid testing but patient declined at that time.  She has not had a chest x-ray.  Symptoms continue to worsen.  She is still having intermittent low-grade fevers most recently last night 99.1.  Patient admits to still having some wheezing.  She has been using her DuoNeb nebulized medication every 4-6 hours as needed for shortness of breath or wheezing.  She remains adherent  to her Advair.  Observations/Objective:  Persistent cough throughout exam  Patient is visibly fatigued  NPSG 07/06/13 WNL- AHI 4/ hr; PLMAs 2.6/ hr.  PFT 07/18/15-WNL-FVC 3.03/88%, FEV1 2.63/95%, ratio 0.87, TLC 105%, DLCO 113%  Echocardiogram 02/12/15-EF 60-65 percent, grade 1 diastolic dysfunction  Assessment and Plan:  Lobar pneumonia, unspecified organism ([place]) Likely patient has pneumonia Patient is visibly fatigued with low-grade fevers as well as loss of appetite Patient has had 2 rounds of antibiotic therapy since September/2020 a Z-Pak in September, doxycycline in October  Plan: Vantin 200 mg twice daily for 7 days Azithromycin-Z-Pak today Patient to obtain outpatient Covid testing, ordered for our system but patient would prefer to complete at [facility] family medicine Patient likely would benefit from a chest x-ray if she is indeed Covid negative Patient would also benefit from sputum cultures if she is indeed Covid negative >>>We can evaluate this after after we have a negative Covid test result  Repeated to the patient multiple times today that if fevers worsen, if she is unable to tolerate p.o. foods and liquids, or shortness of breath and cough worsen she needs to seek emergent evaluation as I believe she likely has a difficult to treat pneumonia given her symptoms.  Fever Plan: Obtain outpatient Covid testing, you preferred to have this completed at [facility] family medicine It is your responsibility to have that information faxed over to our office prior to being seen in office in 2 weeks  Asthma with bronchitis Plan: Continue Advair as outlined Continue duo nebs as needed    Follow Up Instructions:  Return in about 2 weeks (around 06/09/2019), or if symptoms worsen or fail to improve, for Follow up  with Cheryl Quaker FNP-C, Follow up with Dr. [name].    I discussed the assessment and treatment plan with the patient. The patient was provided an opportunity to ask  questions and all were answered. The patient agreed with the plan and demonstrated an understanding of the instructions.   The patient was advised to call back or seek an in-person evaluation if the symptoms worsen or if the condition fails to improve as anticipated.  I provided 26 minutes of non-face-to-face time during this encounter.   Lauraine Rinne, NP

## 2019-05-26 NOTE — Assessment & Plan Note (Signed)
Plan: Continue Advair as outlined Continue duo nebs as needed

## 2019-05-26 NOTE — Patient Instructions (Addendum)
You were seen today by Lauraine Rinne, NP  for:   1. Lobar pneumonia, unspecified organism ([place])  I believe with the length of time of your symptoms, nonimprovement despite antibiotic use, loss of appetite, persistent fevers that you probably have a pneumonia.  This is difficult for [name] to fully assess as this is a video visit.  We will treat you with 2 antibiotics listed below:  - cefpodoxime (VANTIN) 200 MG tablet; Take 1 tablet (200 mg total) by mouth 2 (two) times daily.  Dispense: 14 tablet; Refill: 0 - azithromycin (ZITHROMAX) 250 MG tablet; 564m (two tablets) today, then 2576m(1 tablet) for the next 4 days  Dispense: 6 tablet; Refill: 0  I have called in these 2 antibiotics for difficult to treat likely pneumonia.  As you have symptoms that are slow to resolve with previous treatment of azithromycin as well as doxycycline.  You take these antibiotics together.  You do have multiple allergies.  We discussed this over the phone.  If you start having hives, difficulty breathing, or feeling that your throat is swelling up then you need to seek emergent attention and stop these antibiotics immediately.  And present to an emergency room  If symptoms or not improving on these treatments then you need to present to an emergency room for further evaluation and imaging.  2. Fever, unspecified fever cause  You need to obtain outpatient Covid testing, you discussed on the phone that you would prefer to do this [place] [facility] [place] be routed to our office attention to BrWyn QuakerFNP      Please continue to take your Advair as prescribed Please continue to use DuoNeb nebulized medication every 6-8 hours as needed for shortness of breath or wheezing     If your symptoms are not improving under these instructions the need to present to an emergency room for further evaluation and acute management     We recommend today:  No  orders of the defined types were placed in this encounter.  No orders of the defined types were placed in this encounter.  Meds ordered this encounter  Medications  . cefpodoxime (VANTIN) 200 MG tablet    Sig: Take 1 tablet (200 mg total) by mouth 2 (two) times daily.    Dispense:  14 tablet    Refill:  0  . azithromycin (ZITHROMAX) 250 MG tablet    Sig: 50022mtwo tablets) today, then 250m32m tablet) for the next 4 days    Dispense:  6 tablet    Refill:  0    Follow Up:    Return in about 2 weeks (around 06/09/2019), or if symptoms worsen or fail to improve, for Follow up with BriaWyn Quaker-C, Follow up with Dr. YounAnnamaria BootsPlease do your part to reduce the spread of COVID-19:      Reduce your risk of any infection  and COVID19 by using the similar precautions used for avoiding the common cold or flu:  . [place] Kitchenh your hands often with soap and warm water for at least 20 seconds.  If soap and water are not readily available, use an alcohol-based hand sanitizer with at least 60% alcohol.  . If coughing or sneezing, cover your mouth and nose by coughing or sneezing into the elbow areas of your shirt or coat, into a tissue or into your sleeve (not your hands). . [place] GaussASK when in public  .  Avoid shaking hands with others and consider head nods or verbal greetings only. . Avoid touching your eyes, nose, or mouth with unwashed hands.  . Avoid close contact with people who are sick. . Avoid places or events with large numbers of people in one location, like concerts or sporting events. . If you have some symptoms but not all symptoms, continue to monitor at home and seek medical attention if your symptoms worsen. . If you are having a medical emergency, call 911.   [place] / e-Visit: [email or web address]         [facility]: [facility]: [phone]                    [facility]: [phone]     It is flu season:   >>> Best ways to protect herself from the flu: Receive the yearly flu vaccine, practice good hand hygiene washing with soap and also using hand sanitizer when available, eat a nutritious meals, get adequate rest, hydrate appropriately   Please contact the office if your symptoms worsen or you have concerns that you are not improving.   Thank you for choosing [facility] for your healthcare, and for allowing [name] to partner with you on your healthcare journey. I am thankful to be able to provide care to you today.   Wyn Quaker FNP-C    Community-Acquired Pneumonia, Adult Pneumonia is an infection of the lungs. It causes swelling in the airways of the lungs. Mucus and fluid may also build up inside the airways. One type of pneumonia can happen while a person is in a hospital. A different type can happen when a person is not in a hospital (community-acquired pneumonia).  What are the causes?  This condition is caused by germs (viruses, bacteria, or fungi). Some types of germs can be passed from one person to another. This can happen when you breathe in droplets from the cough or sneeze of an infected person. What increases the risk? You are more likely to develop this condition if you:  Have a long-term (chronic) disease, such as: ? Chronic obstructive pulmonary disease (COPD). ? Asthma. ? Cystic fibrosis. ? Congestive heart failure. ? Diabetes. ? Kidney disease.  Have HIV.  Have sickle cell disease.  Have had your spleen removed.  Do not take good care of your teeth and mouth (poor dental hygiene).  Have a medical condition that increases the risk of breathing in droplets from your own mouth and nose.  Have a weakened body defense system (immune system).  Are a smoker.  Travel to areas where the germs that cause this illness are common.  Are around certain animals or the  places they live. What are the signs or symptoms?  A dry cough.  A wet (productive) cough.  Fever.  Sweating.  Chest pain. This often happens when breathing deeply or coughing.  Fast breathing or trouble breathing.  Shortness of breath.  Shaking chills.  Feeling tired (fatigue).  Muscle aches. How is this treated? Treatment for this condition depends on many things. Most adults can be treated at home. In some cases, treatment must happen in a hospital. Treatment may include:  Medicines given by mouth or through an IV tube.  Being given extra oxygen.  Respiratory therapy. In rare cases, treatment for very bad pneumonia may include:  Using a machine to help you breathe.  Having a  procedure to remove fluid from around your lungs. Follow these instructions at home: Medicines  Take over-the-counter and prescription medicines only as told by your doctor. ? Only take cough medicine if you are losing sleep.  If you were prescribed an antibiotic medicine, take it as told by your doctor. Do not stop taking the antibiotic even if you start to feel better. General instructions   Sleep with your head and neck raised (elevated). You can do this by sleeping in a recliner or by putting a few pillows under your head.  Rest as needed. Get at least 8 hours of sleep each night.  Drink enough water to keep your pee (urine) pale yellow.  Eat a healthy diet that includes plenty of vegetables, fruits, whole grains, low-fat dairy products, and lean protein.  Do not use any products that contain nicotine or tobacco. These include cigarettes, e-cigarettes, and chewing tobacco. If you need help quitting, ask your doctor.  Keep all follow-up visits as told by your doctor. This is important. How is this prevented? A shot (vaccine) can help prevent pneumonia. Shots are often suggested for:  People older than 52 years of age.  People older than 52 years of age who: ? Are having cancer  treatment. ? Have long-term (chronic) lung disease. ? Have problems with their body's defense system. You may also prevent pneumonia if you take these actions:  Get the flu (influenza) shot every year.  Go to the dentist as often as told.  Wash your hands often. If you cannot use soap and water, use hand sanitizer. Contact a doctor if:  You have a fever.  You lose sleep because your cough medicine does not help. Get help right away if:  You are short of breath and it gets worse.  You have more chest pain.  Your sickness gets worse. This is very serious if: ? You are an older adult. ? Your body's defense system is weak.  You cough up blood. Summary  Pneumonia is an infection of the lungs.  Most adults can be treated at home. Some will need treatment in a hospital.  Drink enough water to keep your pee pale yellow.  Get at least 8 hours of sleep each night. This information is not intended to replace advice given to you by your health care provider. Make sure you discuss any questions you have with your health care provider. Document Released: 12/31/2007 Document Revised: 11/03/2018 Document Reviewed: 03/11/2018 Elsevier Patient Education  2020 [employer].

## 2019-05-27 ENCOUNTER — Telehealth: Payer: Self-pay | Admitting: Pulmonary Disease

## 2019-05-27 ENCOUNTER — Other Ambulatory Visit: Payer: Self-pay

## 2019-05-27 ENCOUNTER — Encounter: Payer: Self-pay | Admitting: General Surgery

## 2019-05-27 DIAGNOSIS — Z20822 Contact with and (suspected) exposure to covid-19: Secondary | ICD-10-CM

## 2019-05-27 DIAGNOSIS — Z20828 Contact with and (suspected) exposure to other viral communicable diseases: Secondary | ICD-10-CM | POA: Diagnosis not present

## 2019-05-27 DIAGNOSIS — J181 Lobar pneumonia, unspecified organism: Secondary | ICD-10-CM

## 2019-05-27 MED ORDER — AMOXICILLIN-POT CLAVULANATE 875-125 MG PO TABS: 1.0000 | ORAL_TABLET | Freq: Two times a day (BID) | ORAL | 0 refills | Status: DC

## 2019-05-27 NOTE — Telephone Encounter (Signed)
Prescription sent to pharmacy. [employer] spoke with Cheryl [name], told her of the patient reaction to [facility] and that Augmentin was sent as alternative and patient aware of possible rash reaction based on her allergy history and that patient was told to go to their drive thru for benadryl.  I called the patient back and made her aware of the response received below from Wyn Quaker, NP. Patient voiced understanding. Nothing further needed at this time.

## 2019-05-27 NOTE — Telephone Encounter (Signed)
Agree with patient stopping the Vantin.  Please add this to the patient's medication list and list that is a high reaction cough, potential anaphylaxis, rash, hives.  Unfortunately patient also has allergies to fluoroquinolones with Levaquin which was anaphylaxis as well as hives with amoxicillin.  If the patient is familiar with her reaction to amoxicillin and if this was not severe then we can consider doing Augmentin in place of the Vantin.  We chose Vantin because she has tolerated Omnicef in the past.  If the patient would like we can consider a trial of Augmentin this does have amoxicillin in it which has caused a rash in the past.  Agree with proceeding forward with testing.  Will await patient's contact regarding history of amoxicillin induced rash.  Wyn Quaker, FNP

## 2019-05-27 NOTE — Telephone Encounter (Signed)
I called the patient back and she said she will try the Augmentin. I did suggest to her having whoever is with her to go to pharmacy drive thru and get liquid benadryl or the gel caps if the have it.   She confirmed local pharmacy as [facility].

## 2019-05-27 NOTE — Telephone Encounter (Signed)
Okay to stop Vantin.  Please update allergy list.  Okay to prescribe:  Augmentin >>> Take 1 875-125 mg tablet every 12 hours for the next 7 days >>> Take with food  Emphasized need for patient to monitor for hives or allergies.  If patient has small hives or rash can continue to monitor with antibiotic use.  Patient can use antihistamines.  If patient develops a ongoing or worsening cough or difficulty breathing or throat swelling up she needs to stop the antibiotics immediately and seek emergent evaluation at an emergency room.  Continue taking azithromycin as ordered.  As soon as we receive Covid testing results we will notify the patient.  Once again as discussed with the patient yesterday if symptoms do not improve under this current regimen and we are still awaiting Covid testing results and she is clinically worsening that she needs to present to an emergency room for further evaluation.  Wyn Quaker, FNP

## 2019-05-27 NOTE — Telephone Encounter (Signed)
Aaron Edelman, I called the back and she said that after taking the Vantin issued yesterday her throat felt like it was closing and developed a rash on her neck and chest. The cough got worse also.   Patient stated the only thing at the home she was able to use was children's Benedryl. She filled the cup (that comes with the bottle) to the top and took that to try to help. Since yesterday the cough remains. The rash is still on her neck and chest but not as bad as it was yesterday.  Patient stated she was on her way to [facility] covid test site based on the instructions given to her yesterday during video visit. Patient confirmed she can be reached on her cell # [phone].  I told her to not take the Vantin today, until she hears back from [name].  Please advise of recommendations and/or alternative medication that can be issued. Thanks.

## 2019-05-28 LAB — NOVEL CORONAVIRUS, NAA: SARS-CoV-2, NAA: NOT DETECTED

## 2019-05-29 NOTE — Progress Notes (Signed)
Negative covid test. This is good news.   How are you doing on the augmentin and azithromycin?  Wyn Quaker FNP

## 2019-05-30 NOTE — Progress Notes (Signed)
See previous note

## 2019-05-30 NOTE — Progress Notes (Signed)
Okay.  Noted.  Please keep scheduled follow-up with our office.  Wyn Quaker, FNP

## 2019-06-03 ENCOUNTER — Telehealth: Payer: Self-pay | Admitting: Pulmonary Disease

## 2019-06-03 DIAGNOSIS — J45909 Unspecified asthma, uncomplicated: Secondary | ICD-10-CM

## 2019-06-03 NOTE — Telephone Encounter (Signed)
Spoke with pt, c/o persistent cough X2 mos, and wheezing Xfew days since having allergic reaction to the abx that we had prescribed her. Spoke with Aaron Edelman, who advised it was ok to keep her appt as scheduled, and wants to add a cxr prior to appt. Spoke with pt, who is aware of recs.  cxr is ordered in chart, and note has been made in appt notes.  Nothing further needed at this time- will close encounter.

## 2019-06-05 NOTE — Progress Notes (Signed)
@Patient  ID: Cheryl [name], female    DOB: January 06, 1966, 53 y.o.   MRN: [id number]  Chief Complaint  Patient presents with  . Follow-up    F/U for PNA. States she has a sharp pain in right side. Still has a cough and SOB despite finishing her prednisone and abx.     Referring provider: Kieth Brightly  HPI:  53 year old female former smoker followed in our office for asthma, allergic rhinitis and periodic limb movement for sleep  Past medical history: Diabetes, GERD, fibromyalgia, aortic and coronary arthrosclerosis, hypertension, chronic fatigue syndrome and hypothyroidism Smoking history: Former smoker Maintenance: Advair 115 Patient of Dr. [name]   06/06/2019  - Visit   53 year old female former smoker followed in our office for asthma.  Patient completed a video visit with our office on 05/26/2019.  Patient was suspected lingular pneumonia.  Patient had already been previously been treated empirically by her primary care doctor in September with a Z-Pak as well as in October with a 10-day course of doxycycline.  Patient continued to have persistent low-grade intermittent fevers.  Patient did complete Covid testing which was negative.  Patient's physical exam during video visit on 05/26/2019 patient looked visibly fatigued as well as also was having loss of appetite.  There was just patient for likely pneumonia.  Patient has 20 allergies in her chart which limits what we can utilize from an antibiotic standpoint to help treat the patient.  Patient reported that she is tolerated Omnicef in the past so she was prescribed Vantin as well as azithromycin per current pneumonia guidelines.  Patient unfortunately had a reaction to Vantin so this was stopped.  Despite patient having an amoxicillin allergy she reports that she can tolerate Augmentin.  The patient was treated with azithromycin and Augmentin.  Patient presenting today for a follow-up appointment.  Patient completed a chest  x-ray prior to this office visit.  Chest x-ray from today shows bronchitis.  No formal opacity seen.  Patient continues to have a dry consistent cough.  It is nonproductive.  She does not have a flutter valve at home.  She feels that she cannot get the mucus up.  She continues to have low-grade intermittent temperatures.  Her temperature last night was 99.1.  She reports that ranges between 99 and 100.  Patient reporting today that she is ran out of her Advair.  She cannot remember the last time that she took this.  She has been maintained on rescue inhaler as well as DuoNeb nebulized meds.  Patient struggling with using her CPAP but she reports that the pressures feel like it smothering her.  Patient also is currently planning to have 7 teeth extracted this week by an oral surgeon.  Tests:   05/27/2019-SARS-CoV-2-not detected  NPSG 07/06/13 WNL- AHI 4/ hr; PLMAs 2.6/ hr.  PFT 07/18/15-WNL-FVC 3.03/88%, FEV1 2.63/95%, ratio 0.87, TLC 105%, DLCO 113%  Echocardiogram 02/12/15-EF 60-65 percent, grade 1 diastolic dysfunction  FENO:  No results found for: NITRICOXIDE  PFT: PFT Results Latest Ref Rng & Units 07/18/2015  FVC-Pre L 2.79  FVC-Predicted Pre % 81  FVC-Post L 3.03  FVC-Predicted Post % 88  Pre FEV1/FVC % % 83  Post FEV1/FCV % % 87  FEV1-Pre L 2.33  FEV1-Predicted Pre % 85  FEV1-Post L 2.63  DLCO UNC% % 113  DLCO COR %Predicted % 114  TLC L 5.18  TLC % Predicted % 105  RV % Predicted % 130    WALK:  No flowsheet data found.  Imaging: Dg Chest 2 View  Result Date: 06/06/2019 CLINICAL DATA:  Wheezing and cough. EXAM: CHEST - 2 VIEW COMPARISON:  Chest CT 01/04/2015. Chest radiographs 09/10/2017. FINDINGS: The cardiomediastinal silhouette is within normal limits. There is mild peribronchial thickening. No airspace consolidation, edema, pleural effusion, pneumothorax is identified. Prior anterior cervical spine fusion is noted. IMPRESSION: Mild peribronchial thickening  which may reflect bronchitis or reactive airways disease. Electronically Signed   By: Logan Bores M.D.   On: 06/06/2019 10:29    Lab Results:  CBC    Component Value Date/Time   WBC 13.6 (H) 08/21/2017 1117   WBC 13.0 (H) 02/11/2016 0817   WBC 16.5 (H) 05/18/2015 2034   RBC 3.89 08/21/2017 1117   RBC 4.57 02/11/2016 0817   RBC 4.40 05/18/2015 2034   HGB 11.6 08/21/2017 1117   HGB 13.4 02/11/2016 0817   HCT 32.5 (L) 08/21/2017 1117   HCT 38.4 02/11/2016 0817   PLT 342 08/21/2017 1117   MCV 84 08/21/2017 1117   MCV 84.0 02/11/2016 0817   MCH 29.8 08/21/2017 1117   MCH 29.3 02/11/2016 0817   MCH 30.5 05/18/2015 2034   MCHC 35.7 08/21/2017 1117   MCHC 34.9 02/11/2016 0817   MCHC 34.5 05/18/2015 2034   RDW 15.4 08/21/2017 1117   RDW 14.0 02/11/2016 0817   LYMPHSABS 3.8 (H) 08/21/2017 1117   LYMPHSABS 4.1 (H) 02/11/2016 0817   MONOABS 0.7 02/11/2016 0817   EOSABS 0.5 (H) 08/21/2017 1117   BASOSABS 0.1 08/21/2017 1117   BASOSABS 0.0 02/11/2016 0817    BMET    Component Value Date/Time   NA 142 08/21/2017 1117   NA 138 02/11/2016 0817   K 4.2 08/21/2017 1117   K 3.7 02/11/2016 0817   CL 99 08/21/2017 1117   CO2 28 08/21/2017 1117   CO2 23 02/11/2016 0817   GLUCOSE 141 (H) 08/21/2017 1117   GLUCOSE 280 (H) 02/11/2016 0817   BUN 13 08/21/2017 1117   BUN 8.2 02/11/2016 0817   CREATININE 0.72 08/21/2017 1117   CREATININE 0.8 02/11/2016 0817   CALCIUM 9.7 08/21/2017 1117   CALCIUM 9.7 02/11/2016 0817   GFRNONAA 98 08/21/2017 1117   GFRAA 113 08/21/2017 1117    BNP No results found for: BNP  ProBNP    Component Value Date/Time   PROBNP 9.0 08/31/2006 1114    Specialty Problems      Pulmonary Problems   Asthma with bronchitis    PFT 07/18/15-WNL-FVC 3.03/88%, FEV1 2.63/95%, ratio 0.87, TLC 105%, DLCO 113%       RHINOSINUSITIS, CHRONIC    Qualifier: Diagnosis of  By: [name] MD, Clinton D       Vocal cord dysfunction   Cough due to  angiotensin-converting enzyme inhibitor    Relationship to ramipril suspected due to timing as of 07/08/2011      Snoring    Previous PSG- 1 positive, then next was negative. CPAP was taken away by [employer].      OSA (obstructive sleep apnea)    NPSG 07/06/13 WNL- AHI 4/ hr; PLMAs 2.6/ hr      Dyspnea   Lobar pneumonia, unspecified organism (HCC)      Allergies  Allergen Reactions  . Hydromorphone Hcl Itching and Rash    redness  . Influenza Vaccines Anaphylaxis and Swelling    Throat swelling, "flu" symptoms  . Iodinated Diagnostic Agents Shortness Of Breath, Nausea And Vomiting, Nausea Only and Rash  . Iodine-131 Shortness  Of Breath, Nausea And Vomiting and Rash  . Iohexol Anaphylaxis, Hives and Swelling     Desc: hives,dyspnea; throat swelling; ok w/ premeds and omnipaque   . Levofloxacin Anaphylaxis and Hives  . Nucynta [Tapentadol Hydrochloride] Other (See Comments)    Hallucinations and  insomnia x3 days  . Pregabalin Other (See Comments)    Reaction-Syncope & unable to speak  . Sulfonamide Derivatives Anaphylaxis  . Vantin [Cefpodoxime] Anaphylaxis, Hives, Rash and Cough  . Oxycodone-Acetaminophen Rash  . Vilazodone Hcl Other (See Comments)    Hallucinations  . Amoxicillin Hives  . Biaxin [Clarithromycin] Hives  . Carbamazepine Itching  . Ciprofloxacin Hives  . Dilaudid [Hydromorphone Hcl] Itching and Rash    redness  . Iodides Nausea Only  . Percocet [Oxycodone-Acetaminophen] Rash  . Tapentadol Other (See Comments)    unknown    Immunization History  Administered Date(s) Administered  . Influenza Split 07/08/2011, 04/19/2012  . Influenza Whole 05/03/2009, 04/27/2010  . Pneumococcal Polysaccharide-23 06/06/2015  . Tdap 08/28/2016    Past Medical History:  Diagnosis Date  . Allergy   . Anxiety   . Asthma   . Cancer ([place])    melanoma 2015 upper Left arm   . Chronic airway obstruction, not elsewhere classified   . Chronic pain    "q where; herniated  disc in my tailbone" (02/09/2013)  . DDD (degenerative disc disease)    CERVIAL AND LUMBAR  . Depression   . Edema   . Fatty liver disease, nonalcoholic   . Fibromyalgia    "severe" (02/09/2013)  . Gastroparesis    from DM and chronic narcotic use  . GERD (gastroesophageal reflux disease)   . Human parvovirus infection   . Hyperlipidemia   . Hypertension   . Hypothyroidism   . Interstitial cystitis   . Migraine headache    "weekly; worse lately" (02/09/2013)  . OSA (obstructive sleep apnea)    "have mask;; don't use it; no one came out to check it" (02/09/2013)  . Osteoporosis   . Peripheral neuropathy    "severe" (02/09/2013)  . 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 PONV (postoperative nausea and vomiting)   . Positive PPD   . Shortness of breath    "at anytime; it's gotten worse recently" (02/09/2013)  . Sleep apnea   . Type II diabetes mellitus ([place])   . Vitamin D deficiency   . Vocal cord dysfunction     Tobacco History: Social History   Tobacco Use  Smoking Status Former Smoker  . Packs/day: 0.10  . Years: 3.00  . Pack years: 0.30  . Types: Cigarettes  . Quit date: 07/28/2005  . Years since quitting: 13.8  Smokeless Tobacco Never Used   Counseling given: Yes  Continue to not smoke  Outpatient Encounter Medications as of 06/06/2019  Medication Sig  . albuterol (PROVENTIL HFA;VENTOLIN HFA) 108 (90 Base) MCG/ACT inhaler Inhale 2 puffs into the lungs every 6 (six) hours as needed for wheezing or shortness of breath.  [name] atorvastatin (LIPITOR) 10 MG tablet Take 10 mg by mouth daily.  . Cholecalciferol (VITAMIN D3) 1000 units CAPS Take 1,000 Units by mouth daily.  . diazepam (VALIUM) 10 MG tablet Take 10 mg by mouth daily.  [name] diltiazem (CARDIZEM CD) 240 MG 24 hr capsule TAKE 1 CAPSULE BY MOUTH EVERY MORNING ON AN EMPTY STOMACH - [name] DENIED, FAXED DR Johnsie Cancel 10/22/15 SS  . FLUoxetine (PROZAC) 40 MG capsule Take 40  mg  by mouth daily.  . fluticasone-salmeterol (ADVAIR HFA) 115-21 MCG/ACT inhaler Inhale 2 puffs into the lungs 2 (two) times daily.  [name] gabapentin (NEURONTIN) 300 MG capsule Take 1,200 mg by mouth at bedtime. 4 capsules at bedtime.  [name] HUMULIN R U-500 KWIKPEN 500 UNIT/ML kwikpen Inject 80-140 Units into the skin See admin instructions. 90 units in the am and 50 units every evening.  . hydrochlorothiazide (HYDRODIURIL) 25 MG tablet Take 25 mg by mouth daily.  . hydrOXYzine (ATARAX/VISTARIL) 10 MG tablet Take 10 mg by mouth 3 (three) times daily as needed.  [name] ibuprofen (ADVIL,MOTRIN) 800 MG tablet Take 800 mg by mouth 3 (three) times daily.   [name] ipratropium-albuterol (DUONEB) 0.5-2.5 (3) MG/3ML SOLN Take 84m by nebulization every 4-6 hours if needed  . levothyroxine (SYNTHROID, LEVOTHROID) 75 MCG tablet Take 75 mcg by mouth daily before breakfast.  . liraglutide (VICTOZA) 18 MG/3ML SOPN Inject 1.2 mg into the skin every evening.   . nebivolol (BYSTOLIC) 10 MG tablet Take 1 tablet (10 mg total) by mouth daily.  .Marland Kitchenomeprazole (PRILOSEC) 40 MG capsule Take 1 capsule (40 mg total) by mouth 2 (two) times daily. MUST HAVE OFFICE VISIT FOR FURTHER REFILLS  . tiZANidine (ZANAFLEX) 4 MG tablet Take 4 mg by mouth 3 (three) times daily.  . [DISCONTINUED] amoxicillin-clavulanate (AUGMENTIN) 875-125 MG tablet Take 1 tablet by mouth 2 (two) times daily.  . [DISCONTINUED] azithromycin (ZITHROMAX) 250 MG tablet 5065m(two tablets) today, then 25036m1 tablet) for the next 4 days   No facility-administered encounter medications on file as of 06/06/2019.      Review of Systems  Review of Systems  Constitutional: Positive for fatigue and fever. Negative for activity change.  HENT: Negative for sinus pressure, sinus pain and sore throat.        Upcoming oral extraction to remove 7 teeth later on this week  Respiratory: Positive for cough, shortness of breath and wheezing.   Cardiovascular: Negative for chest pain,  palpitations and leg swelling.  Musculoskeletal: Negative for arthralgias.  Neurological: Negative for dizziness.  Psychiatric/Behavioral: Negative for sleep disturbance. The patient is not nervous/anxious.      Physical Exam  BP 128/84 (BP Location: Left Arm, Patient Position: Sitting, Cuff Size: Normal)   Pulse 84   Temp (!) 97 F (36.1 C) (Temporal)   Ht 5' 5"  (1.651 m)   Wt 193 lb (87.5 kg)   SpO2 96%   BMI 32.12 kg/m   Wt Readings from Last 5 Encounters:  06/06/19 193 lb (87.5 kg)  02/24/19 197 lb 12.8 oz (89.7 kg)  05/12/18 218 lb (98.9 kg)  04/22/18 218 lb (98.9 kg)  12/31/17 218 lb (98.9 kg)    BMI Readings from Last 5 Encounters:  06/06/19 32.12 kg/m  02/24/19 32.92 kg/m  05/12/18 39.87 kg/m  04/22/18 39.87 kg/m  12/31/17 39.87 kg/m     Physical Exam Vitals signs and nursing note reviewed.  Constitutional:      General: She is not in acute distress.    Appearance: Normal appearance. She is obese.     Comments: Chronically ill adult female  HENT:     Head: Normocephalic and atraumatic.     Right Ear: Tympanic membrane, ear canal and external ear normal. There is no impacted cerumen.     Left Ear: Tympanic membrane, ear canal and external ear normal. There is no impacted cerumen.     Nose: Congestion and rhinorrhea present.     Mouth/Throat:  Mouth: Mucous membranes are moist.     Dentition: Abnormal dentition. Dental caries and gum lesions present.     Pharynx: Oropharynx is clear.     Comments: Inflamed oral dentition, multiple dental caries, high likelihood that there are dental abscesses Eyes:     Pupils: Pupils are equal, round, and reactive to light.  Neck:     Musculoskeletal: Normal range of motion.  Cardiovascular:     Rate and Rhythm: Normal rate and regular rhythm.     Pulses: Normal pulses.     Heart sounds: Normal heart sounds. No murmur.  Pulmonary:     Effort: Pulmonary effort is normal. No respiratory distress.     Breath  sounds: No decreased air movement. Wheezing (Inspiratory and expiratory wheezes) present. No decreased breath sounds or rales.  Abdominal:     General: Abdomen is flat. Bowel sounds are normal.     Palpations: Abdomen is soft.  Skin:    General: Skin is warm and dry.     Capillary Refill: Capillary refill takes less than 2 seconds.  Neurological:     General: No focal deficit present.     Mental Status: She is alert and oriented to person, place, and time. Mental status is at baseline.     Gait: Gait normal.  Psychiatric:        Mood and Affect: Mood normal.        Behavior: Behavior normal.        Thought Content: Thought content normal.        Judgment: Judgment normal.       Assessment & Plan:   Asthma with bronchitis Reviewed chest x-ray with patient today Patient reports she has been off Advair for some time Using rescue inhaler 8 times daily   Plan: Stop Advair Start Breo Ellipta 200 Continue to not smoke Follow-up in our office in 4 weeks with a clinical pharmacy appointment   Lobar pneumonia, unspecified organism ([place]) Chest x-ray today reviewed with patient, not showing any formal opacity, bronchitis  OSA (obstructive sleep apnea) Plan: Continue CPAP therapy as outlined  Fever High likelihood that low-grade fevers are directly related to poor dentition as patient is awaiting to have 7 teeth extracted  Plan: Lab work today Urinalysis today Already reviewed chest x-ray with patient  Medication management Patient is off of Advair  Plan: Trial of Breo Ellipta 200 Follow-up visit scheduled with clinical pharmacist to review formulary as well as inhaler teaching    Return in about 4 weeks (around 07/04/2019), or if symptoms worsen or fail to improve, for Follow up with Dr. [name], Follow up with Wyn Quaker FNP-C.   Lauraine Rinne, NP 06/06/2019   This appointment was 28 minutes long with over 50% of the time in direct face-to-face patient care,  assessment, plan of care, and follow-up.

## 2019-06-06 ENCOUNTER — Other Ambulatory Visit (INDEPENDENT_AMBULATORY_CARE_PROVIDER_SITE_OTHER): Payer: Medicare HMO

## 2019-06-06 ENCOUNTER — Other Ambulatory Visit: Payer: Self-pay

## 2019-06-06 ENCOUNTER — Ambulatory Visit: Payer: Medicare HMO | Admitting: Pulmonary Disease

## 2019-06-06 ENCOUNTER — Encounter: Payer: Self-pay | Admitting: Pulmonary Disease

## 2019-06-06 ENCOUNTER — Ambulatory Visit (INDEPENDENT_AMBULATORY_CARE_PROVIDER_SITE_OTHER): Payer: Medicare HMO

## 2019-06-06 VITALS — BP 128/84 | HR 84 | Temp 97.0°F | Ht 65.0 in | Wt 193.0 lb

## 2019-06-06 DIAGNOSIS — Z79899 Other long term (current) drug therapy: Secondary | ICD-10-CM | POA: Diagnosis not present

## 2019-06-06 DIAGNOSIS — G4733 Obstructive sleep apnea (adult) (pediatric): Secondary | ICD-10-CM | POA: Diagnosis not present

## 2019-06-06 DIAGNOSIS — J45909 Unspecified asthma, uncomplicated: Secondary | ICD-10-CM

## 2019-06-06 DIAGNOSIS — J181 Lobar pneumonia, unspecified organism: Secondary | ICD-10-CM | POA: Diagnosis not present

## 2019-06-06 DIAGNOSIS — R509 Fever, unspecified: Secondary | ICD-10-CM

## 2019-06-06 DIAGNOSIS — R05 Cough: Secondary | ICD-10-CM | POA: Diagnosis not present

## 2019-06-06 LAB — COMPREHENSIVE METABOLIC PANEL
ALT: 25 U/L (ref 0–35)
AST: 20 U/L (ref 0–37)
Albumin: 4.3 g/dL (ref 3.5–5.2)
Alkaline Phosphatase: 69 U/L (ref 39–117)
BUN: 8 mg/dL (ref 6–23)
CO2: 24 mEq/L (ref 19–32)
Calcium: 9.2 mg/dL (ref 8.4–10.5)
Chloride: 106 mEq/L (ref 96–112)
Creatinine, Ser: 0.68 mg/dL (ref 0.40–1.20)
GFR: 90.67 mL/min (ref >60.00)
Glucose, Bld: 143 mg/dL — ABNORMAL HIGH (ref 70–99)
Potassium: 3.9 mEq/L (ref 3.5–5.1)
Sodium: 138 mEq/L (ref 135–145)
Total Bilirubin: 0.4 mg/dL (ref 0.2–1.2)
Total Protein: 7.1 g/dL (ref 6.0–8.3)

## 2019-06-06 LAB — CBC WITH DIFFERENTIAL/PLATELET
Basophils Absolute: 0.2 10*3/uL — ABNORMAL HIGH (ref 0.0–0.1)
Basophils Relative: 1.3 % (ref 0.0–3.0)
Eosinophils Absolute: 2 10*3/uL — ABNORMAL HIGH (ref 0.0–0.7)
Eosinophils Relative: 15.4 % — ABNORMAL HIGH (ref 0.0–5.0)
HCT: 39.3 % (ref 36.0–46.0)
Hemoglobin: 13.7 g/dL (ref 12.0–15.0)
Lymphocytes Relative: 36.6 % (ref 12.0–46.0)
Lymphs Abs: 4.7 10*3/uL — ABNORMAL HIGH (ref 0.7–4.0)
MCHC: 34.8 g/dL (ref 30.0–36.0)
MCV: 83.9 fl (ref 78.0–100.0)
Monocytes Absolute: 0.6 10*3/uL (ref 0.1–1.0)
Monocytes Relative: 4.9 % (ref 3.0–12.0)
Neutro Abs: 5.4 10*3/uL (ref 1.4–7.7)
Neutrophils Relative %: 41.8 % — ABNORMAL LOW (ref 43.0–77.0)
Platelets: 313 10*3/uL (ref 150.0–400.0)
RBC: 4.68 Mil/uL (ref 3.87–5.11)
RDW: 13.6 % (ref 11.5–15.5)
WBC: 12.9 10*3/uL — ABNORMAL HIGH (ref 4.0–10.5)

## 2019-06-06 LAB — URINALYSIS
Bilirubin Urine: NEGATIVE
Hgb urine dipstick: NEGATIVE
Ketones, ur: NEGATIVE
Leukocytes,Ua: NEGATIVE
Nitrite: NEGATIVE
Specific Gravity, Urine: 1.025 (ref 1.000–1.030)
Total Protein, Urine: NEGATIVE
Urine Glucose: NEGATIVE
Urobilinogen, UA: 0.2 (ref 0.0–1.0)
pH: 6 (ref 5.0–8.0)

## 2019-06-06 MED ORDER — ALBUTEROL SULFATE HFA 108 (90 BASE) MCG/ACT IN AERS: 2.0000 | INHALATION_SPRAY | Freq: Four times a day (QID) | RESPIRATORY_TRACT | 1 refills | Status: DC | PRN

## 2019-06-06 MED ORDER — BREO ELLIPTA 200-25 MCG/INH IN AEPB: 1.0000 | INHALATION_SPRAY | Freq: Every day | RESPIRATORY_TRACT | 0 refills | Status: DC

## 2019-06-06 NOTE — Progress Notes (Signed)
Blood work still showing elevated allergy markers.  These have been elevated in the past.  They are considerably more elevated though right now.  We will repeat this blood test at next office visit.  Let us see how you do with the new inhaler start.  I need to contact information of the patient's oral surgeon that she is planning to work with on Friday.  Wyn Quaker, FNP

## 2019-06-06 NOTE — Assessment & Plan Note (Signed)
High likelihood that low-grade fevers are directly related to poor dentition as patient is awaiting to have 7 teeth extracted  Plan: Lab work today Urinalysis today Already reviewed chest x-ray with patient

## 2019-06-06 NOTE — Assessment & Plan Note (Addendum)
Reviewed chest x-ray with patient today Patient reports she has been off Advair for some time Using rescue inhaler 8 times daily   Plan: Stop Advair Start Breo Ellipta 200 Continue to not smoke Follow-up in our office in 4 weeks with a clinical pharmacy appointment

## 2019-06-06 NOTE — Assessment & Plan Note (Signed)
Patient is off of Advair  Plan: Trial of Breo Ellipta 200 Follow-up visit scheduled with clinical pharmacist to review formulary as well as inhaler teaching

## 2019-06-06 NOTE — Progress Notes (Signed)
Discussed results with patient in office.  Nothing further is needed at this time.  Brian Mack FNP  

## 2019-06-06 NOTE — Patient Instructions (Addendum)
You were seen today by Lauraine Rinne, NP  for:   1. Asthma with bronchitis  Breo Ellipta 200 >>> Take 1 puff daily in the morning right when you wake up >>>Rinse your mouth out after use >>>This is a daily maintenance inhaler, NOT a rescue inhaler >>>Contact our office if you are having difficulties affording or obtaining this medication >>>It is important for you to be able to take this daily and not miss any doses   Only use your albuterol as a rescue medication to be used if you can't catch your breath by resting or doing a relaxed purse lip breathing pattern.  - The less you use it, the better it will work when you need it. - Ok to use up to 2 puffs  every 4 hours if you must but call for immediate appointment if use goes up over your usual need - Don't leave home without it !!  (think of it like the spare tire for your car)   Continue DuoNeb nebulized meds every 6-8 hours as needed for shortness of breath or wheezing  2. Fever, unspecified fever cause  I likely believe that the low-grade fevers are directly related to your poor dentition and the fact that you need to have 7 teeth removed  - CBC w/Diff; Future - Comp Met (CMET); Future - Urinalysis; Future - Urine Culture; Future  Please go to the lab [facility]  Please follow-up with your oral surgeon and let them know that we are concerned regarding the low-grade temps.  Hopefully your breathing will stabilize once we are starting your inhaler back.  We recommend today:  Orders Placed This Encounter  Procedures  . Urine Culture    Standing Status:   Future    Standing Expiration Date:   06/05/2020  . CBC w/Diff    Standing Status:   Future    Standing Expiration Date:   06/05/2020  . Comp Met (CMET)    Standing Status:   Future    Standing Expiration Date:   06/05/2020  . Urinalysis    Standing Status:   Future    Standing Expiration Date:   06/05/2020   Orders Placed This Encounter  Procedures  . Urine Culture  . CBC  w/Diff  . Comp Met (CMET)  . Urinalysis   No orders of the defined types were placed in this encounter.   Follow Up:    Return in about 4 weeks (around 07/04/2019), or if symptoms worsen or fail to improve, for Follow up with Dr. [name], Follow up with Wyn Quaker FNP-C.   Please also schedule a clinical pharmacy team appointment sometime over the next 4 weeks for medication cost, formulary review, inhaler teaching   Please do your part to reduce the spread of COVID-19:      Reduce your risk of any infection  and COVID19 by using the similar precautions used for avoiding the common cold or flu:  [name] Wash your hands often with soap and warm water for at least 20 seconds.  If soap and water are not readily available, use an alcohol-based hand sanitizer with at least 60% alcohol.  . If coughing or sneezing, cover your mouth and nose by coughing or sneezing into the elbow areas of your shirt or coat, into a tissue or into your sleeve (not your hands). Langley Gauss A MASK when in public  . Avoid shaking hands with others and consider head nods or verbal greetings only. . Avoid touching your  eyes, nose, or mouth with unwashed hands.  . Avoid close contact with people who are sick. . Avoid places or events with large numbers of people in one location, like concerts or sporting events. . If you have some symptoms but not all symptoms, continue to monitor at home and seek medical attention if your symptoms worsen. . If you are having a medical emergency, call 911.   [place] / e-Visit: [email or web address]         [facility]: [facility]: [phone]                   [facility]: [phone]     It is flu season:   >>> Best ways to protect herself from the flu: Receive the yearly flu vaccine, practice good hand hygiene washing with soap  and also using hand sanitizer when available, eat a nutritious meals, get adequate rest, hydrate appropriately   Please contact the office if your symptoms worsen or you have concerns that you are not improving.   Thank you for choosing [facility] for your healthcare, and for allowing [name] to partner with you on your healthcare journey. I am thankful to be able to provide care to you today.   Wyn Quaker FNP-C

## 2019-06-06 NOTE — Assessment & Plan Note (Signed)
Plan: Continue CPAP therapy as outlined

## 2019-06-06 NOTE — Assessment & Plan Note (Signed)
Chest x-ray today reviewed with patient, not showing any formal opacity, bronchitis

## 2019-06-07 ENCOUNTER — Ambulatory Visit (INDEPENDENT_AMBULATORY_CARE_PROVIDER_SITE_OTHER)
Admission: RE | Admit: 2019-06-07 | Discharge: 2019-06-07 | Disposition: A | Discharge status: Home / Self Care | Payer: Medicare HMO | Source: Ambulatory Visit | Attending: Pulmonary Disease | Admitting: Pulmonary Disease

## 2019-06-07 ENCOUNTER — Telehealth: Payer: Self-pay | Admitting: Pharmacy Technician

## 2019-06-07 ENCOUNTER — Other Ambulatory Visit (INDEPENDENT_AMBULATORY_CARE_PROVIDER_SITE_OTHER): Payer: Medicare HMO

## 2019-06-07 ENCOUNTER — Other Ambulatory Visit: Payer: Self-pay

## 2019-06-07 ENCOUNTER — Telehealth: Payer: Self-pay | Admitting: Pulmonary Disease

## 2019-06-07 DIAGNOSIS — D72119 Hypereosinophilic syndrome (hes), unspecified: Secondary | ICD-10-CM

## 2019-06-07 DIAGNOSIS — J45909 Unspecified asthma, uncomplicated: Secondary | ICD-10-CM | POA: Diagnosis not present

## 2019-06-07 DIAGNOSIS — R05 Cough: Secondary | ICD-10-CM

## 2019-06-07 DIAGNOSIS — K029 Dental caries, unspecified: Secondary | ICD-10-CM

## 2019-06-07 DIAGNOSIS — R0602 Shortness of breath: Secondary | ICD-10-CM | POA: Diagnosis not present

## 2019-06-07 DIAGNOSIS — R059 Cough, unspecified: Secondary | ICD-10-CM

## 2019-06-07 DIAGNOSIS — R509 Fever, unspecified: Secondary | ICD-10-CM

## 2019-06-07 DIAGNOSIS — J320 Chronic maxillary sinusitis: Secondary | ICD-10-CM | POA: Diagnosis not present

## 2019-06-07 LAB — VITAMIN B12: Vitamin B-12: 729 pg/mL (ref 211–911)

## 2019-06-07 NOTE — Telephone Encounter (Signed)
Will recreate a reminder to follow up on the biopsy in 1 week.

## 2019-06-07 NOTE — Telephone Encounter (Signed)
Yes do think inhaler technique would be helpful.  Patient also needs likely a med reconciliation or further review on the importance of her inhaler use as she stopped taking her Advair with no understanding of why.  Patient's eosinophil count on blood work yesterday was 2000.  We likely will also be considering either work-up for hypereosinophilic syndrome or referral to allergy in the future.  Patient also may be candidate for Biologics.Aaron Edelman

## 2019-06-07 NOTE — Telephone Encounter (Signed)
06/07/2019 1014  See result note.  Patient still having persistent worsening symptoms.  Brio Ellipta has helped minorly.  Patient also with significantly elevated eosinophil count on blood work from yesterday.  I have contacted the patient's oral surgeon Dr. Luvenia Heller [phone]) to update them regarding the patient's care.  I left my contact information including my personal cell phone number.  I have also left my desk phone number and the office number for them to follow-up.  We are interested in potentially having the patient get tissue samples from her teeth extraction for eosinophil evaluation.  I have also contacted the patient to update her that I have requested additional lab work to be completed as well as updated CT imaging.  I have placed an order for CT of her sinuses as well as a CT of her chest.  Lab work I have placed to further evaluate her elevated eosinophil count.  We will send to patient care coordinator to coordinate.  Patient knows that she can complete lab work at [facility] location.   Wyn Quaker FNP

## 2019-06-07 NOTE — Telephone Encounter (Signed)
Findings of benefits investigation via test claims at [facility]:   Insurance: [employer] Medicare D plan   Adair Patter - # 1 inhaler for a 1 month supply through patient's insurance is $ 0.00.  Symbicort 160 - # 1 inhaler for a 1 month supply through patient's insurance is $ 0.00.  Advair diskus - # 1 inhaler for a 1 month supply through patient's insurance is $ 0.00.  Wixela - # 1 inhaler for a 1 month supply through patient's insurance is $ 0.00.  Ruthe Mannan is Non-formulary under patient's plan.  9:16 AM Beatriz Chancellor, CPhT

## 2019-06-07 NOTE — Telephone Encounter (Signed)
Thank you for all you do!  We will review with the patient when I see her back on 07/04/2019.  Thank you for also sending to [facility] and [facility] to keep them in the loop as patient is also supposed be scheduled with clinical pharmacy follow-up as well.Wyn Quaker, FNP

## 2019-06-07 NOTE — Progress Notes (Signed)
See telephone note regarding this.  Wyn Quaker FNP

## 2019-06-07 NOTE — Telephone Encounter (Signed)
Ct's are scheduled for 06/07/19

## 2019-06-07 NOTE — Telephone Encounter (Signed)
06/07/2019 0919  Patrice, Can you follow-up and make sure this patient is scheduled to clinical pharmacy team.  It was clearly written on her AVS.  Patient made a follow-up appointment with me but never was scheduled with the clinical pharmacy team appointment.  Unsure why this happened.  See my AVS information listed below:   Follow Up:    Return in about 4 weeks (around 07/04/2019), or if symptoms worsen or fail to improve, for Follow up with Dr. [name], Follow up with Wyn Quaker FNP-C.   Please also schedule a clinical pharmacy team appointment sometime over the next 4 weeks for medication cost, formulary review, inhaler teaching   Will route to [facility], [employer] and [place] as Juluis Rainier.  Wyn Quaker, FNP

## 2019-06-07 NOTE — Telephone Encounter (Signed)
Sarah call back from Dr. Sueanne Margarita office.  [phone].

## 2019-06-07 NOTE — Telephone Encounter (Signed)
06/07/2019 1129  I contacted Dr. Luvenia Heller office.  I spoke with his nurse.  They will attempt to do a mucosal biopsy while they are doing the teeth extraction later on this week.  This lab result would go to [facility] pathology to be read.  I have also updated their office that we are trying to get the patient scheduled for a CT of her sinuses as well as a CT of her chest.  Wyn Quaker FNP

## 2019-06-08 ENCOUNTER — Ambulatory Visit: Payer: Medicare HMO | Admitting: Pulmonary Disease

## 2019-06-08 LAB — URINE CULTURE
MICRO NUMBER:: 1078685
Result:: NO GROWTH
SPECIMEN QUALITY:: ADEQUATE

## 2019-06-08 NOTE — Telephone Encounter (Signed)
Called and scheduled patient at 11am on 07/04/2019 -pr

## 2019-06-08 NOTE — Progress Notes (Signed)
Lab work showing elevated IgE.  Please start taking Singulair:  Montelukast 10 mg Take 1 10 mg tablet daily  Please place the order.  Wyn Quaker, FNP

## 2019-06-09 LAB — TRYPTASE: Tryptase: 4.9 mcg/L (ref <11.0)

## 2019-06-09 LAB — IGE: IgE (Immunoglobulin E), Serum: 373 kU/L — ABNORMAL HIGH (ref <=114)

## 2019-06-10 ENCOUNTER — Other Ambulatory Visit: Payer: Self-pay

## 2019-06-10 MED ORDER — MONTELUKAST SODIUM 10 MG PO TABS: 10.0000 mg | ORAL_TABLET | Freq: Every day | ORAL | 0 refills | Status: DC

## 2019-06-10 NOTE — Progress Notes (Signed)
I have reviewed your CT results.  I have also discussed them with Dr. [name].  We believe that we may need to get you in for a bronchoscopy for further evaluation of your cough as well as your elevated blood work.  This would be done by different pulmonologist in our office.  Probably best to have you scheduled within the next 1 to 2 weeks to further discuss and evaluate.  Okay to move up December/2020 office visit.  Wyn Quaker, FNP

## 2019-06-13 NOTE — Progress Notes (Signed)
Prior sinus surgery.  Mild mucosal edema in right maxillary sinus.  No changes to plan of care at this time.  Wyn Quaker, FNP

## 2019-06-14 ENCOUNTER — Encounter: Payer: Self-pay | Admitting: Pulmonary Disease

## 2019-06-14 ENCOUNTER — Telehealth: Payer: Self-pay | Admitting: Pulmonary Disease

## 2019-06-14 NOTE — Telephone Encounter (Signed)
Patient scheduled for bronchoscopy on 06/28/2019 at 8:30 AM at [facility].

## 2019-06-14 NOTE — Telephone Encounter (Signed)
Spoke with patient. She is aware of the procedure, timing and location of the procedure. She is also aware that one of the PCCs will call her for her COVID test. Asked patient if she had a preference of testing location, she prefers [place].   Patient is also aware that I will mail information to her explaining what the bronch is.   PCCs, can we get her scheduled for her COVID test? Thank you!

## 2019-06-14 NOTE — Telephone Encounter (Signed)
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  I believe the patient needs to have a bronchoscopy with BAL and potentially transbronchial biopsies to help with this diagnosis.  I have discussed this case with Dr. [name] he agrees.  I have also discussed this case with Dr. Vaughan Browner who agrees and is willing to perform the bronchoscopy.   I called and spoke with the patient.  She is agreeable to proceed forward with bronchoscopy.  No blood thinners currently on file are on the [facility].  Patient reports she is taking Elmiron which is a urinary analgesic.  Patient reports she is not going to start Singulair due to her history of bipolar.  Unfortunately patient still was not able to get her teeth removed last Friday due to a technical issue with the dental offices EMR.  Patient is working to get this rescheduled.  She reports that she likely will need to have 7 teeth removed.  I explained to the patient that she will need to have outpatient Covid testing and have a negative Covid test to proceed for the bronchoscopy.  She agrees.  We can go ahead and cancel the office visit for the patient this week.  Will route to [place] for ordering of bronch and cancelling the ov. Will route to Dr. Vaughan Browner as Juluis Rainier.   Wyn Quaker FNP er

## 2019-06-14 NOTE — Telephone Encounter (Signed)
I have called pt & scheduled her covid test.  Nothing further needed.

## 2019-06-20 ENCOUNTER — Ambulatory Visit: Payer: Medicare HMO | Admitting: Pulmonary Disease

## 2019-06-20 ENCOUNTER — Ambulatory Visit: Payer: Medicare HMO

## 2019-06-25 ENCOUNTER — Other Ambulatory Visit (HOSPITAL_COMMUNITY)
Admission: RE | Admit: 2019-06-25 | Discharge: 2019-06-25 | Disposition: A | Discharge status: Home / Self Care | Payer: Medicare HMO | Source: Ambulatory Visit | Attending: Pulmonary Disease | Admitting: Pulmonary Disease

## 2019-06-25 DIAGNOSIS — Z20828 Contact with and (suspected) exposure to other viral communicable diseases: Secondary | ICD-10-CM | POA: Insufficient documentation

## 2019-06-25 DIAGNOSIS — Z01812 Encounter for preprocedural laboratory examination: Secondary | ICD-10-CM | POA: Diagnosis not present

## 2019-06-25 LAB — SARS CORONAVIRUS 2 (TAT 6-24 HRS): SARS Coronavirus 2: NEGATIVE

## 2019-06-28 ENCOUNTER — Observation Stay (HOSPITAL_COMMUNITY): Payer: Medicare HMO

## 2019-06-28 ENCOUNTER — Encounter (HOSPITAL_COMMUNITY)
Admission: RE | Disposition: A | Discharge status: Home / Self Care | Payer: Self-pay | Source: Home / Self Care | Attending: Pulmonary Disease

## 2019-06-28 ENCOUNTER — Ambulatory Visit (HOSPITAL_COMMUNITY): Payer: Medicare HMO

## 2019-06-28 ENCOUNTER — Observation Stay (HOSPITAL_COMMUNITY)
Admission: RE | Admit: 2019-06-28 | Discharge: 2019-06-30 | Disposition: A | Discharge status: Home / Self Care | Payer: Medicare HMO | Attending: Pulmonary Disease | Admitting: Pulmonary Disease

## 2019-06-28 ENCOUNTER — Encounter (HOSPITAL_COMMUNITY): Payer: Self-pay | Admitting: Respiratory Therapy

## 2019-06-28 ENCOUNTER — Other Ambulatory Visit: Payer: Self-pay

## 2019-06-28 ENCOUNTER — Ambulatory Visit (HOSPITAL_COMMUNITY)
Admission: RE | Admit: 2019-06-28 | Discharge: 2019-06-28 | Disposition: A | Discharge status: Home / Self Care | Payer: Medicare HMO | Source: Ambulatory Visit | Attending: Pulmonary Disease | Admitting: Pulmonary Disease

## 2019-06-28 DIAGNOSIS — J849 Interstitial pulmonary disease, unspecified: Secondary | ICD-10-CM

## 2019-06-28 DIAGNOSIS — Y838 Other surgical procedures as the cause of abnormal reaction of the patient, or of later complication, without mention of misadventure at the time of the procedure: Secondary | ICD-10-CM | POA: Insufficient documentation

## 2019-06-28 DIAGNOSIS — E785 Hyperlipidemia, unspecified: Secondary | ICD-10-CM | POA: Insufficient documentation

## 2019-06-28 DIAGNOSIS — Z79890 Hormone replacement therapy (postmenopausal): Secondary | ICD-10-CM | POA: Insufficient documentation

## 2019-06-28 DIAGNOSIS — E119 Type 2 diabetes mellitus without complications: Secondary | ICD-10-CM | POA: Insufficient documentation

## 2019-06-28 DIAGNOSIS — J95811 Postprocedural pneumothorax: Secondary | ICD-10-CM | POA: Diagnosis not present

## 2019-06-28 DIAGNOSIS — J939 Pneumothorax, unspecified: Secondary | ICD-10-CM | POA: Diagnosis present

## 2019-06-28 DIAGNOSIS — E039 Hypothyroidism, unspecified: Secondary | ICD-10-CM | POA: Insufficient documentation

## 2019-06-28 DIAGNOSIS — J45909 Unspecified asthma, uncomplicated: Secondary | ICD-10-CM | POA: Diagnosis not present

## 2019-06-28 DIAGNOSIS — Z7951 Long term (current) use of inhaled steroids: Secondary | ICD-10-CM | POA: Diagnosis not present

## 2019-06-28 DIAGNOSIS — I10 Essential (primary) hypertension: Secondary | ICD-10-CM | POA: Diagnosis not present

## 2019-06-28 DIAGNOSIS — R918 Other nonspecific abnormal finding of lung field: Secondary | ICD-10-CM | POA: Diagnosis not present

## 2019-06-28 DIAGNOSIS — Z9889 Other specified postprocedural states: Secondary | ICD-10-CM

## 2019-06-28 DIAGNOSIS — Z794 Long term (current) use of insulin: Secondary | ICD-10-CM | POA: Diagnosis not present

## 2019-06-28 DIAGNOSIS — J9600 Acute respiratory failure, unspecified whether with hypoxia or hypercapnia: Secondary | ICD-10-CM | POA: Diagnosis not present

## 2019-06-28 DIAGNOSIS — Z79899 Other long term (current) drug therapy: Secondary | ICD-10-CM | POA: Insufficient documentation

## 2019-06-28 DIAGNOSIS — D721 Eosinophilia, unspecified: Secondary | ICD-10-CM | POA: Diagnosis not present

## 2019-06-28 DIAGNOSIS — Y92239 Unspecified place in hospital as the place of occurrence of the external cause: Secondary | ICD-10-CM | POA: Insufficient documentation

## 2019-06-28 DIAGNOSIS — R079 Chest pain, unspecified: Secondary | ICD-10-CM | POA: Diagnosis not present

## 2019-06-28 HISTORY — PX: BRONCHOSCOPY: SUR163

## 2019-06-28 HISTORY — PX: VIDEO BRONCHOSCOPY: SHX5072

## 2019-06-28 LAB — HIV ANTIBODY (ROUTINE TESTING W REFLEX): HIV Screen 4th Generation wRfx: NONREACTIVE

## 2019-06-28 LAB — HEMOGLOBIN A1C
Hgb A1c MFr Bld: 7.2 % — ABNORMAL HIGH (ref 4.8–5.6)
Mean Plasma Glucose: 159.94 mg/dL

## 2019-06-28 LAB — BODY FLUID CELL COUNT WITH DIFFERENTIAL
Eos, Fluid: 5 %
Lymphs, Fluid: 33 %
Monocyte-Macrophage-Serous Fluid: 50 % (ref 50–90)
Neutrophil Count, Fluid: 12 % (ref 0–25)
Total Nucleated Cell Count, Fluid: 178 cu mm (ref 0–1000)

## 2019-06-28 LAB — GLUCOSE, CAPILLARY
Glucose-Capillary: 192 mg/dL — ABNORMAL HIGH (ref 70–99)
Glucose-Capillary: 148 mg/dL — ABNORMAL HIGH (ref 70–99)
Glucose-Capillary: 146 mg/dL — ABNORMAL HIGH (ref 70–99)
Glucose-Capillary: 166 mg/dL — ABNORMAL HIGH (ref 70–99)

## 2019-06-28 SURGERY — BRONCHOSCOPY, WITH FLUOROSCOPY
Anesthesia: Moderate Sedation | Laterality: Bilateral

## 2019-06-28 MED ORDER — IPRATROPIUM-ALBUTEROL 0.5-2.5 (3) MG/3ML IN SOLN
3.0000 mL | RESPIRATORY_TRACT | Status: DC | PRN
  Administered 2019-06-29 – 2019-06-30 (×2): 3 mL via RESPIRATORY_TRACT
  Filled 2019-06-28 (×2): qty 3

## 2019-06-28 MED ORDER — PHENYLEPHRINE HCL 0.25 % NA SOLN
1.0000 | Freq: Four times a day (QID) | NASAL | Status: DC | PRN
  Filled 2019-06-28: qty 15

## 2019-06-28 MED ORDER — IBUPROFEN 400 MG PO TABS
400.0000 mg | ORAL_TABLET | ORAL | Status: DC | PRN
  Administered 2019-06-28: 400 mg via ORAL
  Filled 2019-06-28 (×3): qty 1

## 2019-06-28 MED ORDER — LIDOCAINE HCL URETHRAL/MUCOSAL 2 % EX GEL: 1.0000 "application " | Freq: Once | CUTANEOUS | Status: DC

## 2019-06-28 MED ORDER — OXYCODONE-ACETAMINOPHEN 5-325 MG PO TABS
1.0000 | ORAL_TABLET | ORAL | Status: DC | PRN
  Administered 2019-06-28 – 2019-06-29 (×3): 1 via ORAL
  Filled 2019-06-28 (×3): qty 1

## 2019-06-28 MED ORDER — OXYCODONE-ACETAMINOPHEN 5-325 MG PO TABS
ORAL_TABLET | ORAL | Status: AC
  Filled 2019-06-28: qty 1

## 2019-06-28 MED ORDER — FENTANYL CITRATE (PF) 100 MCG/2ML IJ SOLN
INTRAMUSCULAR | Status: DC | PRN
  Administered 2019-06-28 (×6): 25 ug via INTRAVENOUS

## 2019-06-28 MED ORDER — LIDOCAINE HCL URETHRAL/MUCOSAL 2 % EX GEL
CUTANEOUS | Status: DC | PRN
  Administered 2019-06-28: 1

## 2019-06-28 MED ORDER — INSULIN ASPART 100 UNIT/ML ~~LOC~~ SOLN
0.0000 [IU] | Freq: Three times a day (TID) | SUBCUTANEOUS | Status: DC
  Administered 2019-06-29: 5 [IU] via SUBCUTANEOUS
  Administered 2019-06-29: 3 [IU] via SUBCUTANEOUS
  Administered 2019-06-29: 5 [IU] via SUBCUTANEOUS
  Administered 2019-06-30: 3 [IU] via SUBCUTANEOUS

## 2019-06-28 MED ORDER — DILTIAZEM HCL ER COATED BEADS 240 MG PO CP24
240.0000 mg | ORAL_CAPSULE | Freq: Every day | ORAL | Status: DC
  Administered 2019-06-29 – 2019-06-30 (×2): 240 mg via ORAL
  Filled 2019-06-28 (×2): qty 1

## 2019-06-28 MED ORDER — FENTANYL CITRATE (PF) 100 MCG/2ML IJ SOLN
INTRAMUSCULAR | Status: AC
  Filled 2019-06-28: qty 4

## 2019-06-28 MED ORDER — PHENYLEPHRINE HCL 0.25 % NA SOLN
NASAL | Status: DC | PRN
  Administered 2019-06-28: 2 via NASAL

## 2019-06-28 MED ORDER — LEVOTHYROXINE SODIUM 75 MCG PO TABS
75.0000 ug | ORAL_TABLET | Freq: Every day | ORAL | Status: DC
  Administered 2019-06-28 – 2019-06-30 (×3): 75 ug via ORAL
  Filled 2019-06-28 (×3): qty 1

## 2019-06-28 MED ORDER — BUTAMBEN-TETRACAINE-BENZOCAINE 2-2-14 % EX AERO: 1.0000 | INHALATION_SPRAY | Freq: Once | CUTANEOUS | Status: DC

## 2019-06-28 MED ORDER — INSULIN ASPART 100 UNIT/ML ~~LOC~~ SOLN
0.0000 [IU] | SUBCUTANEOUS | Status: DC
  Administered 2019-06-28: 2 [IU] via SUBCUTANEOUS

## 2019-06-28 MED ORDER — SODIUM CHLORIDE 0.9 % IV SOLN
INTRAVENOUS | Status: DC
  Administered 2019-06-28: 10:00:00 via INTRAVENOUS

## 2019-06-28 MED ORDER — MIDAZOLAM HCL (PF) 5 MG/ML IJ SOLN
INTRAMUSCULAR | Status: AC
  Filled 2019-06-28: qty 2

## 2019-06-28 MED ORDER — MIDAZOLAM HCL (PF) 10 MG/2ML IJ SOLN
INTRAMUSCULAR | Status: DC | PRN
  Administered 2019-06-28 (×6): 1 mg via INTRAVENOUS

## 2019-06-28 MED ORDER — LIDOCAINE HCL 1 % IJ SOLN
INTRAMUSCULAR | Status: DC | PRN
  Administered 2019-06-28: 6 mL via RESPIRATORY_TRACT

## 2019-06-28 MED ORDER — NEBIVOLOL HCL 10 MG PO TABS
10.0000 mg | ORAL_TABLET | Freq: Every day | ORAL | Status: DC
  Administered 2019-06-29 – 2019-06-30 (×2): 10 mg via ORAL
  Filled 2019-06-28 (×2): qty 1

## 2019-06-28 MED ORDER — OXYCODONE-ACETAMINOPHEN 5-325 MG PO TABS
1.0000 | ORAL_TABLET | ORAL | Status: DC | PRN
  Administered 2019-06-28: 1 via ORAL

## 2019-06-28 NOTE — Progress Notes (Signed)
Dr Vaughan Browner discussed pt taking Percocet for her pain, and she is fine with taking this med

## 2019-06-28 NOTE — Progress Notes (Signed)
CXR done

## 2019-06-28 NOTE — H&P (Signed)
Cheryl [name]    [id number]    1966/02/06  [facility], Faylene Million, PA-C  Referring Physician: No referring provider defined for this encounter.  Chief complaint: Evaluation for asthma, eosinophilia, abnormal CT  HPI: Cheryl [name] is a 53 year old with history of asthma, peripheral eosinophilia.  She is a patient of Dr. [name] and Wyn Quaker, NP.  She has been referred to me for bronchoscopy to rule out eosinophilic pneumonia  Patient examined in procedure room today.  She is states that she does not have any complaints of dyspnea cough, wheezing.  No fevers or chills   _0 @  Allergies as of 06/14/2019 - Review Complete 06/14/2019  Allergen Reaction Noted  . Hydromorphone hcl Itching and Rash 09/28/2014  . Influenza vaccines Anaphylaxis and Swelling 09/06/2012  . Iodinated diagnostic agents Shortness Of Breath, Nausea And Vomiting, Nausea Only, and Rash 12/12/2015  . Iodine-131 Shortness Of Breath, Nausea And Vomiting, and Rash 10/07/2011  . Iohexol Anaphylaxis, Hives, and Swelling 07/30/2005  . Levofloxacin Anaphylaxis and Hives   . Nucynta [tapentadol hydrochloride] Other (See Comments) 10/23/2010  . Pregabalin Other (See Comments)   . Sulfonamide derivatives Anaphylaxis 04/09/2009  . Vantin [cefpodoxime] Anaphylaxis, Hives, Rash, and Cough 05/27/2019  . Oxycodone-acetaminophen Rash 10/23/2010  . Vilazodone hcl Other (See Comments) 10/23/2010  . Amoxicillin Hives   . Biaxin [clarithromycin] Hives 10/23/2010  . Carbamazepine Itching 08/01/2013  . Ciprofloxacin Hives 04/09/2009  . Dilaudid [hydromorphone hcl] Itching and Rash 09/28/2014  . Iodides Nausea Only 11/30/2013  . Percocet [oxycodone-acetaminophen] Rash 10/23/2010  . Tapentadol Other (See Comments) 12/12/2015   Review of systems: Review of Systems  Constitutional: Negative for fever and chills.  HENT: Negative.   Eyes: Negative for blurred vision.  Respiratory: as per HPI   Cardiovascular: Negative for chest pain and palpitations.  Gastrointestinal: Negative for vomiting, diarrhea, blood per rectum. Genitourinary: Negative for dysuria, urgency, frequency and hematuria.  Musculoskeletal: Negative for myalgias, back pain and joint pain.  Skin: Negative for itching and rash.  Neurological: Negative for dizziness, tremors, focal weakness, seizures and loss of consciousness.  Endo/Heme/Allergies: Negative for environmental allergies.  Psychiatric/Behavioral: Negative for depression, suicidal ideas and hallucinations.  All other systems reviewed and are negative.  Physical Exam: Blood pressure 122/62, pulse 80, temperature 98.4 F (36.9 C), temperature source Oral, resp. rate 14, SpO2 98 %. Gen:      No acute distress HEENT:  EOMI, sclera anicteric Neck:     No masses; no thyromegaly Lungs:    Clear to auscultation bilaterally; normal respiratory effort CV:         Regular rate and rhythm; no murmurs Abd:      + bowel sounds; soft, non-tender; no palpable masses, no distension Ext:    No edema; adequate peripheral perfusion Skin:      Warm and dry; no rash Neuro: alert and oriented x 3 Psych: normal mood and affect  Data Reviewed: Imaging: CT chest 06/07/2019-minimal groundglass opacities in the upper lobes.  Bronchial wall thickening, subcentimeter pulmonary nodules. I have reviewed the images personally.  Labs: CBC 06/06/2019-WBC 12.9, eos 15.4%, absolute eosinophil count 1987  Assessment/Plan:  Asthma, peripheral eosinophilia with groundglass opacities on chest CT Rule out eosinophilic pneumonia  Risk benefits of bronchoscopy explained in detail with patient and she has agreed to proceed with bronchoscopy with BAL, possible biopsy.   Marshell Garfinkel MD [facility] 06/28/2019, 9:20 AM  CC: No ref. provider found

## 2019-06-28 NOTE — Progress Notes (Signed)
Video Bronchoscopy done Intervention Bronchial biopsy Intervention Bronchial washing I

## 2019-06-28 NOTE — Progress Notes (Signed)
Pt was recovered by RT in endo, until her PACU admission at 1120.

## 2019-06-28 NOTE — Progress Notes (Signed)
Dr Vaughan Browner here-aware of CXR results. Pt put on 2L [place] per his order and she will be admitted for overnite OBS.  He is going out to waiting rm to update pt's mother.

## 2019-06-28 NOTE — Op Note (Signed)
[facility] Cardiopulmonary Patient Name: Cheryl [name] Pocedure Date: 06/28/2019 MRN: [id number] Attending MD: Marshell Garfinkel , MD Date of Birth: 1966/01/03 CSN: Finalized Age: 53 Admit Type: Outpatient Gender: Female Procedure:             Bronchoscopy Indications:           Infiltrate Providers:             Marshell Garfinkel, MD, Andre Lefort RRT,RCP, Ashley Mariner RRT,RCP Referring MD:           Medicines:             Midazolam 6 mg IV, Fentanyl 326 mcg IV Complications:         No immediate complications Estimated Blood Loss:  Estimated blood loss: none. Procedure:             Pre-Anesthesia Assessment:                        - A History and Physical has been performed. Patient                         meds and allergies have been reviewed. The risks and                         benefits of the procedure and the sedation options and                         risks were discussed with the patient. All questions                         were answered and informed consent was obtained.                         Patient identification and proposed procedure were                         verified prior to the procedure by the physician in                         the procedure room. Mental Status Examination: alert                         and oriented. Airway Examination: normal oropharyngeal                         airway. Respiratory Examination: clear to                         auscultation. CV Examination: normal. ASA Grade                         Assessment: II - A patient with mild systemic disease.                         After reviewing the risks and benefits, the patient  was deemed in satisfactory condition to undergo the                         procedure. The anesthesia plan was to use moderate                         sedation / analgesia (conscious sedation). Immediately                         prior to  administration of medications, the patient                         was re-assessed for adequacy to receive sedatives. The                         heart rate, respiratory rate, oxygen saturations,                         blood pressure, adequacy of pulmonary ventilation, and                         response to care were monitored throughout the                         procedure. The physical status of the patient was                         re-assessed after the procedure.                        After obtaining informed consent, the bronchoscope was                         passed under direct vision. Throughout the procedure,                         the patient's blood pressure, pulse, and oxygen                         saturations were monitored continuously. the BF-H190                         ([id number]) Olympus Diagnostic Bronchoscope was                         introduced through the right nostril and advanced to                         the tracheobronchial tree of both lungs. Scope In: 8:57:07 AM Scope Out: 9:10:51 AM Findings:      The nasopharynx/oropharynx appears normal. The larynx appears normal.       The vocal cords appear normal. The subglottic space is normal. The       trachea is of normal caliber. The carina is sharp. The tracheobronchial       tree was examined to at least the first subsegmental level. Bronchial       mucosa and anatomy are normal; there are no endobronchial lesions, and       no secretions.  Bronchoalveolar lavage was performed in the RUL apical segment (B1) of       the lung and sent for cell count, bacterial culture, viral smears &       culture, and fungal & AFB analysis and cytology. 180 mL of fluid were       instilled. 120 mL were returned. The return was cloudy. There were no       mucoid plugs in the return fluid. Multiple specimens were obtained and       pooled into one specimen, which was sent for analysis.      Transbronchial biopsies of  an area of infiltration were performed in the       apical segment of the right upper lobe and in the medial segment of the       right middle lobe using alligator forceps and sent for histopathology       examination. The procedure was guided by fluoroscopy. Transbronchial       biopsy technique was selected because the sampling site was not visible       endoscopically. Eight biopsy passes were performed. Eight biopsy samples       were obtained. Impression:            - Infiltrate                        - The airway examination was normal.                        - Bronchoalveolar lavage was performed.                        - Transbronchial lung biopsies were performed. Moderate Sedation:      Moderate (conscious) sedation was administered by the endoscopy nurse       and supervised by the endoscopist. The following parameters were       monitored: oxygen saturation, heart rate, blood pressure, respiratory       rate, EKG, adequacy of pulmonary ventilation, and response to care.       Total physician intraservice time was 25 minutes. Recommendation:        - Await BAL and biopsy results. Procedure Code(s):     --- Professional ---                        [phone], Bronchoscopy, rigid or flexible, including                         fluoroscopic guidance, when performed; with                         transbronchial lung biopsy(s), single lobe                        60454, Bronchoscopy, rigid or flexible, including                         fluoroscopic guidance, when performed; with bronchial                         alveolar lavage                        [phone], Bronchoscopy, rigid  or flexible, including                         fluoroscopic guidance, when performed; with                         transbronchial lung biopsy(s), each additional lobe                         (List separately in addition to code for primary                         procedure)                        99152, Moderate  sedation services provided by the same                         physician or other qualified health care professional                         performing the diagnostic or therapeutic service that                         the sedation supports, requiring the presence of an                         independent trained observer to assist in the                         monitoring of the patient's level of consciousness and                         physiological status; initial 15 minutes of                         intraservice time, patient age 20 years or older                        [phone], Moderate sedation; each additional 15 minutes                         intraservice time Diagnosis Code(s):     --- Professional ---                        R91.8, Other nonspecific abnormal finding of lung field CPT copyright 2019 American Medical Association. All rights reserved. The codes documented in this report are preliminary and upon coder review may  be revised to meet current compliance requirements. Marshell Garfinkel, MD 06/28/2019 9:33:54 AM Number of Addenda: 0

## 2019-06-28 NOTE — Discharge Instructions (Signed)
Flexible Bronchoscopy, Care After This sheet gives you information about how to care for yourself after your test. Your doctor may also give you more specific instructions. If you have problems or questions, contact your doctor. Follow these instructions at home: Eating and drinking  Do not eat or drink anything (not even water) for 2 hours after your test, or until your numbing medicine (local anesthetic) wears off.  When your numbness is gone and your cough and gag reflexes have come back, you may: ? Eat only soft foods. ? Slowly drink liquids.  The day after the test, go back to your normal diet. Driving  Do not drive for 24 hours if you were given a medicine to help you relax (sedative).  Do not drive or use heavy machinery while taking prescription pain medicine. General instructions   Take over-the-counter and prescription medicines only as told by your doctor.  Return to your normal activities as told. Ask what activities are safe for you.  Do not use any products that have nicotine or tobacco in them. This includes cigarettes and e-cigarettes. If you need help quitting, ask your doctor.  Keep all follow-up visits as told by your doctor. This is important. It is very important if you had a tissue sample (biopsy) taken. Get help right away if:  You have shortness of breath that gets worse.  You get light-headed.  You feel like you are going to pass out (faint).  You have chest pain.  You cough up: ? More than a little blood. ? More blood than before. Summary  Do not eat or drink anything (not even water) for 2 hours after your test, or until your numbing medicine wears off.  Do not use cigarettes. Do not use e-cigarettes.  Get help right away if you have chest pain. This information is not intended to replace advice given to you by your health care provider. Make sure you discuss any questions you have with your health care provider. Document Released: 05/11/2009  Document Revised: 06/26/2017 Document Reviewed: 08/01/2016 Elsevier Patient Education  2020 [employer]. Nothing to eat or drink until            Am today  06/28/2019 Any questions or concerns please call the office at   [phone]

## 2019-06-28 NOTE — Progress Notes (Signed)
PCCM progress note  CXR post procedure shows small right apical pneumothorax. Pt c/o pleuritic type chest pain on the left.  In no obvious respiratory distress  Blood pressure 119/61, pulse 68, temperature 97.7 F (36.5 C), resp. rate 12, SpO2 100 %. Gen:      No acute distress HEENT:  EOMI, sclera anicteric Neck:     No masses; no thyromegaly Lungs:    Clear to auscultation bilaterally; normal respiratory effort CV:         Regular rate and rhythm; no murmurs Abd:      + bowel sounds; soft, non-tender; no palpable masses, no distension Ext:    No edema; adequate peripheral perfusion Skin:      Warm and dry; no rash Neuro: alert and oriented x 3 Psych: normal mood and affect  Assessment/Plan: Work-up for interstitial lung disease Small pneumothorax post bronchoscopy procedure which has remained stable on follow-up chest x-ray  We will continue supplemental oxygen to help with resorption of pneumothorax Admit for overnight observation Percocet as needed for pain.  Patient has listed allergies but cannot recall the type of reaction Repeat chest x-ray in the morning and anticipate discharge on 12/2.  Patient and mom updated in the hospital.  I called her daughter [name] on the telephone but did not get an answer.  Marshell Garfinkel MD [facility] 06/28/2019, 3:12 PM

## 2019-06-28 NOTE — Plan of Care (Signed)

## 2019-06-29 ENCOUNTER — Inpatient Hospital Stay (HOSPITAL_COMMUNITY): Payer: Medicare HMO

## 2019-06-29 ENCOUNTER — Observation Stay (HOSPITAL_COMMUNITY): Payer: Medicare HMO

## 2019-06-29 DIAGNOSIS — J95811 Postprocedural pneumothorax: Secondary | ICD-10-CM | POA: Diagnosis not present

## 2019-06-29 DIAGNOSIS — J9600 Acute respiratory failure, unspecified whether with hypoxia or hypercapnia: Secondary | ICD-10-CM | POA: Diagnosis not present

## 2019-06-29 DIAGNOSIS — D721 Eosinophilia, unspecified: Secondary | ICD-10-CM | POA: Diagnosis not present

## 2019-06-29 DIAGNOSIS — I10 Essential (primary) hypertension: Secondary | ICD-10-CM | POA: Diagnosis not present

## 2019-06-29 DIAGNOSIS — Z79890 Hormone replacement therapy (postmenopausal): Secondary | ICD-10-CM | POA: Diagnosis not present

## 2019-06-29 DIAGNOSIS — E039 Hypothyroidism, unspecified: Secondary | ICD-10-CM | POA: Diagnosis not present

## 2019-06-29 DIAGNOSIS — E119 Type 2 diabetes mellitus without complications: Secondary | ICD-10-CM | POA: Diagnosis not present

## 2019-06-29 DIAGNOSIS — J45909 Unspecified asthma, uncomplicated: Secondary | ICD-10-CM | POA: Diagnosis not present

## 2019-06-29 DIAGNOSIS — R918 Other nonspecific abnormal finding of lung field: Secondary | ICD-10-CM | POA: Diagnosis not present

## 2019-06-29 DIAGNOSIS — J939 Pneumothorax, unspecified: Secondary | ICD-10-CM | POA: Diagnosis not present

## 2019-06-29 DIAGNOSIS — J849 Interstitial pulmonary disease, unspecified: Secondary | ICD-10-CM | POA: Diagnosis not present

## 2019-06-29 DIAGNOSIS — E785 Hyperlipidemia, unspecified: Secondary | ICD-10-CM | POA: Diagnosis not present

## 2019-06-29 LAB — GLUCOSE, CAPILLARY
Glucose-Capillary: 172 mg/dL — ABNORMAL HIGH (ref 70–99)
Glucose-Capillary: 203 mg/dL — ABNORMAL HIGH (ref 70–99)
Glucose-Capillary: 227 mg/dL — ABNORMAL HIGH (ref 70–99)
Glucose-Capillary: 179 mg/dL — ABNORMAL HIGH (ref 70–99)

## 2019-06-29 LAB — ACID FAST SMEAR (AFB, MYCOBACTERIA): Acid Fast Smear: NEGATIVE

## 2019-06-29 LAB — BASIC METABOLIC PANEL
Anion gap: 9 (ref 5–15)
BUN: 14 mg/dL (ref 6–20)
CO2: 26 mmol/L (ref 22–32)
Calcium: 8.9 mg/dL (ref 8.9–10.3)
Chloride: 104 mmol/L (ref 98–111)
Creatinine, Ser: 0.77 mg/dL (ref 0.44–1.00)
GFR calc Af Amer: >60 mL/min (ref >60)
GFR calc non Af Amer: >60 mL/min (ref >60)
Glucose, Bld: 204 mg/dL — ABNORMAL HIGH (ref 70–99)
Potassium: 3.4 mmol/L — ABNORMAL LOW (ref 3.5–5.1)
Sodium: 139 mmol/L (ref 135–145)

## 2019-06-29 LAB — CYTOLOGY - NON PAP

## 2019-06-29 LAB — MAGNESIUM: Magnesium: 1.9 mg/dL (ref 1.7–2.4)

## 2019-06-29 LAB — CBC
HCT: 37.5 % (ref 36.0–46.0)
Hemoglobin: 12.9 g/dL (ref 12.0–15.0)
MCH: 29.1 pg (ref 26.0–34.0)
MCHC: 34.4 g/dL (ref 30.0–36.0)
MCV: 84.7 fL (ref 80.0–100.0)
Platelets: 314 10*3/uL (ref 150–400)
RBC: 4.43 MIL/uL (ref 3.87–5.11)
RDW: 12.6 % (ref 11.5–15.5)
WBC: 10.7 10*3/uL — ABNORMAL HIGH (ref 4.0–10.5)
nRBC: 0 % (ref 0.0–0.2)

## 2019-06-29 LAB — PHOSPHORUS: Phosphorus: 3.8 mg/dL (ref 2.5–4.6)

## 2019-06-29 LAB — SURGICAL PATHOLOGY

## 2019-06-29 MED ORDER — ATORVASTATIN CALCIUM 10 MG PO TABS
20.0000 mg | ORAL_TABLET | Freq: Every day | ORAL | Status: DC
  Administered 2019-06-29 – 2019-06-30 (×2): 20 mg via ORAL
  Filled 2019-06-29 (×2): qty 2

## 2019-06-29 MED ORDER — MONTELUKAST SODIUM 10 MG PO TABS: 10.0000 mg | ORAL_TABLET | Freq: Every day | ORAL | Status: DC

## 2019-06-29 MED ORDER — FLUTICASONE FUROATE-VILANTEROL 200-25 MCG/INH IN AEPB
1.0000 | INHALATION_SPRAY | Freq: Every day | RESPIRATORY_TRACT | Status: DC
  Filled 2019-06-29 (×2): qty 28

## 2019-06-29 MED ORDER — PANTOPRAZOLE SODIUM 40 MG PO TBEC
40.0000 mg | DELAYED_RELEASE_TABLET | Freq: Every day | ORAL | Status: DC
  Administered 2019-06-29 – 2019-06-30 (×2): 40 mg via ORAL
  Filled 2019-06-29 (×2): qty 1

## 2019-06-29 MED ORDER — VITAMIN D 25 MCG (1000 UNIT) PO TABS
1000.0000 [IU] | ORAL_TABLET | Freq: Every day | ORAL | Status: DC
  Administered 2019-06-29 – 2019-06-30 (×2): 1000 [IU] via ORAL
  Filled 2019-06-29 (×2): qty 1

## 2019-06-29 MED ORDER — FLUOXETINE HCL 20 MG PO CAPS
80.0000 mg | ORAL_CAPSULE | Freq: Every day | ORAL | Status: DC
  Administered 2019-06-29: 80 mg via ORAL
  Filled 2019-06-29: qty 4

## 2019-06-29 MED ORDER — HYDROCHLOROTHIAZIDE 25 MG PO TABS
25.0000 mg | ORAL_TABLET | Freq: Every day | ORAL | Status: DC
  Administered 2019-06-29 – 2019-06-30 (×2): 25 mg via ORAL
  Filled 2019-06-29 (×2): qty 1

## 2019-06-29 MED ORDER — HYDROXYZINE HCL 25 MG PO TABS
50.0000 mg | ORAL_TABLET | Freq: Every day | ORAL | Status: DC
  Administered 2019-06-29: 50 mg via ORAL
  Filled 2019-06-29: qty 2

## 2019-06-29 MED ORDER — GABAPENTIN 300 MG PO CAPS
600.0000 mg | ORAL_CAPSULE | Freq: Every day | ORAL | Status: DC
  Administered 2019-06-29: 600 mg via ORAL
  Filled 2019-06-29: qty 2

## 2019-06-29 NOTE — Progress Notes (Signed)
RN asked to page CCM about results of AM CXR. Merlene Laughter, NP paged. Pt currently 94% on RA w/ no SOB or chest pain at this time. Will continue to monitor.  Clyde Canterbury, RN

## 2019-06-29 NOTE — Progress Notes (Signed)
NAME:  Cheryl [name], MRN:  [id number], DOB:  [birth year], LOS: 0 ADMISSION DATE:  06/28/2019, CONSULTATION DATE:  12/1 REFERRING MD:  Dr. Vaughan Browner, CHIEF COMPLAINT:  Pneumothorax post bronch    Brief History   53 yo admitted post outpatient bronchoscopy due to development of small apical pneumothorax for close monitoring.   History of present illness   Cheryl [name] is a 53 year old with history of asthma, peripheral eosinophilia, and former smoker. She is a patient of Dr. [name] and Wyn Quaker, NP. Presented for outpatient Bronchoscopy 12/1 with Dr. Orma Flaming in order to rule out eosinophilic pneumonia. Elective bronchoscopy was preformed with bronchoalveolar lavaged as well as tracheobronchial biopsies were obtained. Due to complaints of right-sided chest pain,  post procedure chest x-ray was preformed and revealed small (less than 10%) pneumothorax therefore patient was admitted for further observation.   Repeat chest x-ray 12/2 with slight increased size of pneumothorax   Past Medical History  Asthma Melanoma 2015 Chronic airway obstruction DDD Depression Nonalcoholic fatty liver disease GERD Human parvovirus Hypertension Hyperlipidemia Hypothyroidism OSA noncompliant with CPAP Type II diabetes  Vocal cord dysfunction   Significant Hospital Events   12/1 outpatient bronchoscopy, admitted due to pneumothorax   Consults:    Procedures:  12/1 > bronchoscopy   Significant Diagnostic Tests:  CXR 12/1 > Small (less than 10%) right apical pneumothorax. Hazy opacity within the right upper lobe and medial right lung base, which may reflect post biopsy changes versus infection.  CXR 12/2 > Increasing right apical pneumothorax.  Micro Data:  BAL culture 12/1 >  AFB smear 12/1 > negative  BAL fungal culture 12/1 >  Antimicrobials:     Interim history/subjective:  RN reports no acute events overnight, patient reports come continued right-sided chest pain but denies any acute  dyspnea.   Objective   Blood pressure 124/61, pulse 73, temperature 98.3 F (36.8 C), temperature source Oral, resp. rate 15, height _0  (1.575 m), weight 86 kg, SpO2 93 %.    FiO2 (%):  [24 %-55 %] 24 %   Intake/Output Summary (Last 24 hours) at 06/29/2019 1024 Last data filed at 06/29/2019 0754 Gross per 24 hour  Intake 502.17 ml  Output -  Net 502.17 ml   Filed Weights   06/28/19 1800 06/29/19 0500  Weight: 86 kg 86 kg    Examination: General: Adult female lying in bed in NAD HEENT: [place]/AT MM pink/moist, PERRL,  Neuro: Alert and oriented completely, nonfocal, able to follow all commands  CV: s1s2 regular rate and rhythm, no murmur, rubs, or gallops,  PULM:  Slightly diminished in right upper lobed with expiratory wheeze GI: soft, bowel sounds active in all 4 quadrants, non-tender, non-distended Extremities: warm/dry, no edema  Skin: no rashes or lesions   Resolved Hospital Problem list     Assessment & Plan:  Pneumothorax post bronchoscopy Workup for interstitial lung disease  -Repeat CXR 12/2 with slight increase in size of pneumothorax P: Continue supplemental oxygen Pulmonary hygiene Repeat CXR   Pain control  Follow cultures Short acting bronchodilators   Type 2 Diabetes P: SSI Hypoglycemia protocol as needed   Hyperlipidemia  P: Continue home statin   Hypertension -Per chart review no history of arrhythmia  P: Continue home cardizem and bystolic   Hypothyroidism  P: Continue home synthroid   Best practice:  Diet: Heart healthy  Pain/Anxiety/Delirium protocol (if indicated): PRN oxy  VAP protocol (if indicated): N/A DVT prophylaxis: SCD GI prophylaxis: N/A Glucose control: SSI Mobility:  up with assistance  Code Status: Full Family Communication: Patient states she will update family  Disposition: floor  Labs   CBC: Recent Labs  Lab 06/29/19 0221  WBC 10.7*  HGB 12.9  HCT 37.5  MCV 84.7  PLT 376    Basic Metabolic Panel:  Recent Labs  Lab 06/29/19 0221  NA 139  K 3.4*  CL 104  CO2 26  GLUCOSE 204*  BUN 14  CREATININE 0.77  CALCIUM 8.9  MG 1.9  PHOS 3.8   GFR: Estimated Creatinine Clearance: 83.8 mL/min (by C-G formula based on SCr of 0.77 mg/dL). Recent Labs  Lab 06/29/19 0221  WBC 10.7*    Liver Function Tests: No results for input(s): AST, ALT, ALKPHOS, BILITOT, PROT, ALBUMIN in the last 168 hours. No results for input(s): LIPASE, AMYLASE in the last 168 hours. No results for input(s): AMMONIA in the last 168 hours.  ABG    Component Value Date/Time   PHART 7.483 (H) 08/26/2017 1214   PCO2ART 31.1 (L) 08/26/2017 1214   PO2ART 110.0 (H) 08/26/2017 1214   HCO3 23.3 08/26/2017 1214   TCO2 24 08/26/2017 1214   O2SAT 99.0 08/26/2017 1214     Coagulation Profile: No results for input(s): INR, PROTIME in the last 168 hours.  Cardiac Enzymes: No results for input(s): CKTOTAL, CKMB, CKMBINDEX, TROPONINI in the last 168 hours.  HbA1C: Hgb A1c MFr Bld  Date/Time Value Ref Range Status  06/28/2019 04:03 PM 7.2 (H) 4.8 - 5.6 % Final    Comment:    (NOTE) Pre diabetes:          5.7%-6.4% Diabetes:              >6.4% Glycemic control for   <7.0% adults with diabetes   02/03/2014 07:52 AM 8.3 (H) <5.7 % Final    Comment:    (NOTE)                                                                       According to the ADA Clinical Practice Recommendations for 2011, when HbA1c is used as a screening test:  >=6.5%   Diagnostic of Diabetes Mellitus           (if abnormal result is confirmed) 5.7-6.4%   Increased risk of developing Diabetes Mellitus References:Diagnosis and Classification of Diabetes Mellitus,Diabetes EGBT,5176,16(WVPXT 1):S62-S69 and Standards of Medical Care in         Diabetes - 2011,Diabetes GGYI,9485,46 (Suppl 1):S11-S61.    CBG: Recent Labs  Lab 06/28/19 0759 06/28/19 1137 06/28/19 1603 06/28/19 2151 06/29/19 0622  GLUCAP 192* 148* 146* 166* 172*     Signature:   Johnsie Cancel, NP-C [facility] After hours pager: [phone]. 06/29/2019, 11:36 AM

## 2019-06-29 NOTE — Progress Notes (Signed)
Pt placed on venturi mask at 2-24 per verbal order Merlene Laughter, NP.   Clyde Canterbury, RN

## 2019-06-29 NOTE — Progress Notes (Signed)
Pt accidentally removed LH IV while asleep. Declined replacement- has discharge order for this AM.

## 2019-06-30 ENCOUNTER — Telehealth: Payer: Self-pay | Admitting: Pulmonary Disease

## 2019-06-30 ENCOUNTER — Observation Stay (HOSPITAL_COMMUNITY): Payer: Medicare HMO

## 2019-06-30 DIAGNOSIS — J849 Interstitial pulmonary disease, unspecified: Secondary | ICD-10-CM | POA: Diagnosis not present

## 2019-06-30 DIAGNOSIS — E119 Type 2 diabetes mellitus without complications: Secondary | ICD-10-CM | POA: Diagnosis not present

## 2019-06-30 DIAGNOSIS — J45909 Unspecified asthma, uncomplicated: Secondary | ICD-10-CM | POA: Diagnosis not present

## 2019-06-30 DIAGNOSIS — J939 Pneumothorax, unspecified: Secondary | ICD-10-CM | POA: Diagnosis not present

## 2019-06-30 DIAGNOSIS — D721 Eosinophilia, unspecified: Secondary | ICD-10-CM | POA: Diagnosis not present

## 2019-06-30 DIAGNOSIS — R918 Other nonspecific abnormal finding of lung field: Secondary | ICD-10-CM | POA: Diagnosis not present

## 2019-06-30 DIAGNOSIS — E785 Hyperlipidemia, unspecified: Secondary | ICD-10-CM | POA: Diagnosis not present

## 2019-06-30 DIAGNOSIS — J95811 Postprocedural pneumothorax: Secondary | ICD-10-CM | POA: Diagnosis not present

## 2019-06-30 DIAGNOSIS — Z79890 Hormone replacement therapy (postmenopausal): Secondary | ICD-10-CM | POA: Diagnosis not present

## 2019-06-30 DIAGNOSIS — I10 Essential (primary) hypertension: Secondary | ICD-10-CM | POA: Diagnosis not present

## 2019-06-30 DIAGNOSIS — E039 Hypothyroidism, unspecified: Secondary | ICD-10-CM | POA: Diagnosis not present

## 2019-06-30 LAB — CULTURE, BAL-QUANTITATIVE W GRAM STAIN: Culture: NO GROWTH

## 2019-06-30 LAB — GLUCOSE, CAPILLARY: Glucose-Capillary: 173 mg/dL — ABNORMAL HIGH (ref 70–99)

## 2019-06-30 LAB — SEDIMENTATION RATE: Sed Rate: 4 mm/hr (ref 0–22)

## 2019-06-30 NOTE — Progress Notes (Signed)
Pt provided discharge instructions and education. Vital stable. Telebox removed/ccmd notfied. Pt has all belongings. Denies questions. Pt tx via wheelchair to meet ride at [facility].  Jerald Kief, RN

## 2019-06-30 NOTE — Discharge Summary (Signed)
Physician Discharge Summary  Patient ID: Cheryl [name] MRN: [id number] DOB/AGE: [birth year] 53 y.o.  Admit date: 06/28/2019 Discharge date: 06/30/2019    Discharge Diagnoses:  -Pneumothorax post biopsy  -Workup for interstitial lung disease                      DISCHARGE PLAN BY DIAGNOSIS       Discharge Plan: Workup for interstitial lung disease  -faint GGP bilateral apices.  - BAL with 33% lymphs. Non diagnostc Tbbx P: ILD questionnaire as outpatient  Autoimmunie lab work including ACE, autoimmune seriologyut, vasculitis panel, and HP panel prior to discharge Follow up with Dr. Vaughan Browner 12/4 to review results    Pneumothorax post bronchoscopy -06/30/2019 - nearly resolved v small apical on cxr 12/3.  P: D/c home today with follow up with primary pulmonologist Dr. Vaughan Browner 12/4 Repeat CXR tomorrow at follow up  Avoid valasalva maneuvers and straining and lifting heavy objects  Type 2 Diabetes P: Resume home regiment upon discharge   Hyperlipidemia  P: Continue home statin upon discharge   Hypertension -Per chart review no history of arrhythmia  P: Continue home cardizem and bystolic upon discharge   Hypothyroidism  P: Continue home synthroid upon discharge                   DISCHARGE SUMMARY   Cheryl [name] is a 53 year old with history of asthma, peripheral eosinophilia, and former smoker.She is a patient of Dr. [name] and Wyn Quaker, NP. Presented for outpatient Bronchoscopy 12/1 with Dr. Orma Flaming in order to rule out eosinophilic pneumonia. Elective bronchoscopy was preformed with bronchoalveolar lavaged as well as tracheobronchial biopsies were obtained. Due to complaints of right-sided chest pain,  post procedure chest x-ray was preformed and revealed small (less than 10%) pneumothorax therefore patient was admitted for further observation.   Repeat chest x-ray 12/2 with slight increased size of pneumothorax, therefore patient remained hospitalized  overnight for continued observation. By 12/3 pneumothorax has nearly. Patient was able to ambulate without need for supplemental oxygen and remains stable therefore she will be discharged home with follow up pulmonologist apt tomorrow 12/4.            SIGNIFICANT DIAGNOSTIC STUDIES CXR 12/1 > Small (less than 10%) right apical pneumothorax. Hazy opacity within the right upper lobe and medial right lung base, which may reflect post biopsy changes versus infection.  CXR 12/2 > Increasing right apical pneumothorax.   MICRO DATA  BAL 12/1 - Results for Cheryl [name], Cheryl "RENEE" (MRN [id number]) as of 06/30/2019 10:08  TTBX - NON DIAGNOSTIC with 33% lympnths  Ref. Range 06/28/2019 08:08  Color, Fluid Latest Ref Range: YELLOW  COLORLESS (A)  Total Nucleated Cell Count, Fluid Latest Ref Range: 0 - 1,000 cu mm 178  Lymphs, Fluid Latest Units: % 33  Eos, Fluid Latest Units: % 5  Appearance, Fluid Latest Ref Range: CLEAR  HAZY (A)  Other Cells, Fluid Latest Units: % MESOTHELIAL CELLS.  Neutrophil Count, Fluid Latest Ref Range: 0 - 25 % 12  Monocyte-Macrophage-Serous Fluid Latest Ref Range: 50 - 90 % 50    ANTIBIOTICS None  CONSULTS None  TUBES / LINES None   Discharge Exam: General Appearance:  Looks well. Obese + Head:  Normocephalic, without obvious abnormality, atraumatic Eyes:  PERRL - yes, conjunctiva/corneas - clear     Ears:  Normal external ear canals, both ears Neck:  Supple,  No enlargement/tenderness/nodules Lungs: Clear to auscultation bilaterally,  Heart:  S1 and S2 normal, no murmur Abdomen:  Soft, no masses, no organomegaly Genitalia / Rectal:  Not done Extremities:  Extremities- intact Skin:  ntact in exposed areas . Neurologic:  Alert and oriented x3, non-focal   Vitals:   06/30/19 0424 06/30/19 0751 06/30/19 0800 06/30/19 0857  BP: (!) 102/58 122/77    Pulse: 66 68 70   Resp: _0 Temp: 98.1 F (36.7 C) (!) 97.3 F (36.3 C)    TempSrc: Oral Oral     SpO2: 95% 92% 96% 94%  Weight: 86.5 kg     Height:        Discharge Labs  BMET Recent Labs  Lab 06/29/19 0221  NA 139  K 3.4*  CL 104  CO2 26  GLUCOSE 204*  BUN 14  CREATININE 0.77  CALCIUM 8.9  MG 1.9  PHOS 3.8    CBC Recent Labs  Lab 06/29/19 0221  HGB 12.9  HCT 37.5  WBC 10.7*  PLT 314    Anti-Coagulation No results for input(s): INR in the last 168 hours.   Allergies as of 06/30/2019      Reactions   Influenza Vaccines Anaphylaxis, Swelling   Throat swelling, "flu" symptoms   Iodinated Diagnostic Agents Shortness Of Breath, Nausea And Vomiting, Nausea Only, Rash   Iodine-131 Shortness Of Breath, Nausea And Vomiting, Rash   Iohexol Anaphylaxis, Hives, Swelling    Desc: hives,dyspnea; throat swelling; ok w/ premeds and omnipaque   Levofloxacin Anaphylaxis, Hives   Nucynta [tapentadol Hydrochloride] Other (See Comments)   Hallucinations and  insomnia x3 days   Pregabalin Other (See Comments)   (lyrica)Reaction-Syncope & unable to speak   Sulfonamide Derivatives Anaphylaxis   Vantin [cefpodoxime] Anaphylaxis, Hives, Rash, Cough   Vilazodone Hcl Other (See Comments)   (Viibryd) Hallucinations   Amoxicillin Hives, Itching   Did it involve swelling of the face/tongue/throat, SOB, or low BP? No Did it involve sudden or severe rash/hives, skin peeling, or any reaction on the inside of your mouth or nose? Yes Did you need to seek medical attention at a hospital or doctor's office? No When did it last happen?3-4 years ago If all above answers are "NO", may proceed with cephalosporin use. .   Biaxin [clarithromycin] Hives   Carbamazepine Itching   (Tegretol)   Ciprofloxacin Hives   Dilaudid [hydromorphone Hcl] Itching, Rash   redness   Iodides Nausea Only   Percocet [oxycodone-acetaminophen] Rash      Medication List    TAKE these medications   albuterol 108 (90 Base) MCG/ACT inhaler Commonly known as: VENTOLIN HFA Inhale 2 puffs into the lungs  every 6 (six) hours as needed for wheezing or shortness of breath.   atorvastatin 20 MG tablet Commonly known as: LIPITOR Take 20 mg by mouth daily. Notes to patient: Take tomorrow   Breo Ellipta 200-25 MCG/INH Aepb Generic drug: fluticasone furoate-vilanterol Inhale 1 puff into the lungs daily.   diazepam 10 MG tablet Commonly known as: VALIUM Take 10 mg by mouth 2 (two) times daily as needed (panic attacks.).   diltiazem 240 MG 24 hr capsule Commonly known as: CARDIZEM CD TAKE 1 CAPSULE BY MOUTH EVERY MORNING ON AN EMPTY STOMACH - [name] DENIED, FAXED DR Johnsie Cancel 10/22/15 SS What changed: See the new instructions.   FLUoxetine 40 MG capsule Commonly known as: PROZAC Take 80 mg by mouth at bedtime. Notes to patient: Take tonight   gabapentin 300 MG capsule Commonly known as: NEURONTIN Take 600  mg by mouth at bedtime. Notes to patient: Take tonight   HumuLIN R U-500 KwikPen 500 UNIT/ML kwikpen Generic drug: insulin regular human CONCENTRATED Inject 100 Units into the skin daily.   hydrochlorothiazide 25 MG tablet Commonly known as: HYDRODIURIL Take 25 mg by mouth daily. Notes to patient: Take tomorrow   hydrOXYzine 25 MG tablet Commonly known as: ATARAX/VISTARIL Take 50 mg by mouth at bedtime. Notes to patient: Take tonight   ibuprofen 800 MG tablet Commonly known as: ADVIL Take 800 mg by mouth every 8 (eight) hours as needed (pain).   ipratropium-albuterol 0.5-2.5 (3) MG/3ML Soln Commonly known as: DUONEB Take 46m by nebulization every 4-6 hours if needed What changed:   how much to take  how to take this  when to take this  reasons to take this  additional instructions   levothyroxine 75 MCG tablet Commonly known as: SYNTHROID Take 75 mcg by mouth daily before breakfast. Notes to patient: Take tomorrow   montelukast 10 MG tablet Commonly known as: SINGULAIR Take 1 tablet (10 mg total) by mouth at bedtime.   nebivolol 10 MG tablet Commonly  known as: Bystolic Take 1 tablet (10 mg total) by mouth daily. Notes to patient: Take tomorrow   nystatin-triamcinolone cream Commonly known as: MYCOLOG II Apply 1 application topically 2 (two) times daily as needed (yeast in skin folds).   omeprazole 40 MG capsule Commonly known as: PRILOSEC Take 1 capsule (40 mg total) by mouth 2 (two) times daily. MUST HAVE OFFICE VISIT FOR FURTHER REFILLS What changed:   when to take this  additional instructions   pentosan polysulfate 100 MG capsule Commonly known as: ELMIRON Take 100 mg by mouth 3 (three) times daily.   SUMAtriptan 20 MG/ACT nasal spray Commonly known as: IMITREX Place 20 mg into the nose every 2 (two) hours as needed for migraine or headache. May repeat in 2 hours if headache persists or recurs.   tiZANidine 4 MG tablet Commonly known as: ZANAFLEX Take 4 mg by mouth 3 (three) times daily. May hold one dose if needed to operate motor vehicle.   Victoza 18 MG/3ML Sopn Generic drug: liraglutide Inject 1.2 mg into the skin every evening.   Vitamin D3 25 MCG (1000 UT) Caps Take 1,000 Units by mouth daily. Notes to patient: Take tomorrow         Disposition:   Discharged Condition: Cheryl [name] has met maximum benefit of inpatient care and is medically stable and cleared for discharge.  Patient is pending follow up as above.      Time spent on disposition:  36 Minutes.   Signed: WJohnsie Cancel NP-C [facility] After hours pager: [phone] 06/30/2019, 1:28 PM

## 2019-06-30 NOTE — Progress Notes (Signed)
_0  ID: Cheryl [name], female    DOB: 1966/04/02, 53 y.o.   MRN: [id number]  Chief Complaint  Patient presents with  . Hospitalization Follow-up    cxry today, r apical pneumo, s/p bronch     Referring provider: Kieth Brightly  HPI:  53 year old female former smoker followed in our office for asthma, allergic rhinitis and periodic limb movement for sleep  Past medical history: Diabetes, GERD, fibromyalgia, aortic and coronary arthrosclerosis, hypertension, chronic fatigue syndrome and hypothyroidism Smoking history: Former smoker Maintenance: Breo 200  Patient of Dr. [name]   07/01/2019  - Visit   53 year old female former smoker presenting to our office today as a hospital follow-up.  Patient status post bronchoscopy with transbronchial biopsy on 06/28/2019.  Patient did develop a right-sided pneumothorax after this procedure and was admitted to the hospital for observation.  She was discharged on 06/30/2019.  Pneumothorax has slowly started to decrease in size.  Patient also had ILD/connective tissue lab work obtained for suspicion of hypersensitivity pneumonitis based off of CT as well as elevated lymph count and BAL.  Patient denies having down pillows.  She does have chickens but she does not help with them.  Patient does report that her last home did have positive mold exposure.  She has since moved.  She is currently living in a trailer that is only 52 years old.  She does feel that her symptoms are worse when she is at home in her room.  Patient continues to have an ongoing dry barking cough.  She continues to use her Breo Ellipta inhaler.  She continues to use her DuoNeb nebulized meds.  She also uses her rescue inhaler as needed.  There is not much symptomatic improvement.  Not many things have helped her with management of the cough.  Tests:   05/27/2019-SARS-CoV-2-not detected  NPSG 07/06/13 WNL- AHI 4/ hr; PLMAs 2.6/ hr.  PFT 07/18/15-WNL-FVC 3.03/88%, FEV1  2.63/95%, ratio 0.87, TLC 105%, DLCO 113%  Echocardiogram 02/12/15-EF 60-65 percent, grade 1 diastolic dysfunction  74/25/9563-[id number] maxillofacial-mild mucosal edema right maxillary sinus, prior sinus surgery  06/07/2019-CT chest without contrast-mild central bronchial wall thickening can be seen with bronchitis or reactive airways disease, small subpleural groundglass opacities in the upper lobe is nonspecific, findings may be postinfectious or inflammatory, alternatively interstitial lung disease or chronic eosinophilic pneumonia could have a similar appearance, tiny less than 4 mm right and left upper lobe pulmonary nodules, repeat chest CT can be considered in 12 months  06/30/2019-connective tissue work-up- pending   06/28/2019-bronchoscopy-BAL / TBBX  TTBX - NON DIAGNOSTIC with 33% lympnths  Ref. Range 06/28/2019 08:08  Color, Fluid Latest Ref Range: YELLOW  COLORLESS (A)  Total Nucleated Cell Count, Fluid Latest Ref Range: 0 - 1,000 cu mm 178  Lymphs, Fluid Latest Units: % 33  Eos, Fluid Latest Units: % 5  Appearance, Fluid Latest Ref Range: CLEAR  HAZY (A)  Other Cells, Fluid Latest Units: % MESOTHELIAL CELLS.  Neutrophil Count, Fluid Latest Ref Range: 0 - 25 % 12  Monocyte-Macrophage-Serous Fluid Latest Ref Range: 50 - 90 % 50    06/28/2019-chest x-ray-small less than 10% right apical pneumothorax, hazy opacity within the right upper lobe which may reflect postbiopsy changes versus infection  06/29/2019-slight decrease in size of right apical pneumothorax, lungs remain clear  06/30/2019-stable small right apical pneumothorax, mild right base subsegmental atelectasis  06/30/2019-walk in hospital-patient ambulated 470 feet did not require oxygen  FENO:  No results found for: NITRICOXIDE  PFT: PFT Results Latest Ref Rng & Units 07/18/2015  FVC-Pre L 2.79  FVC-Predicted Pre % 81  FVC-Post L 3.03  FVC-Predicted Post % 88  Pre FEV1/FVC % % 83  Post FEV1/FCV % % 87  FEV1-Pre L  2.33  FEV1-Predicted Pre % 85  FEV1-Post L 2.63  DLCO UNC% % 113  DLCO COR %Predicted % 114  TLC L 5.18  TLC % Predicted % 105  RV % Predicted % 130    WALK:  No flowsheet data found.  Imaging: Dg Chest 2 View  Result Date: 07/01/2019 CLINICAL DATA:  Follow-up right pneumothorax EXAM: CHEST - 2 VIEW COMPARISON:  06/30/2019 FINDINGS: Persistent right apical pneumothorax measuring approximately 10%. No focal consolidation, pleural effusion or pneumothorax. Stable cardiomediastinal silhouette. No aggressive osseous lesion. IMPRESSION: Persistent right apical pneumothorax measuring approximately 10%. Electronically Signed   By: Kathreen Devoid   On: 07/01/2019 11:13   Dg Chest 2 View  Result Date: 06/06/2019 CLINICAL DATA:  Wheezing and cough. EXAM: CHEST - 2 VIEW COMPARISON:  Chest CT 01/04/2015. Chest radiographs 09/10/2017. FINDINGS: The cardiomediastinal silhouette is within normal limits. There is mild peribronchial thickening. No airspace consolidation, edema, pleural effusion, pneumothorax is identified. Prior anterior cervical spine fusion is noted. IMPRESSION: Mild peribronchial thickening which may reflect bronchitis or reactive airways disease. Electronically Signed   By: Logan Bores M.D.   On: 06/06/2019 10:29   Ct Chest Wo Contrast  Result Date: 06/07/2019 CLINICAL DATA:  Dyspnea, chronic, neg or nondiagnostic xray. Shortness of breath and cough. Increased wheezing for 2 months. EXAM: CT CHEST WITHOUT CONTRAST TECHNIQUE: Multidetector CT imaging of the chest was performed following the standard protocol without IV contrast. COMPARISON:  Chest radiograph yesterday. Chest CT 01/04/2015 FINDINGS: Cardiovascular: The thoracic aorta is normal in caliber. Minor aortic atherosclerosis. Heart is normal in size. There are coronary artery calcifications. No pericardial effusion. Mediastinum/Nodes: No enlarged mediastinal lymph nodes. No evidence of hilar adenopathy on noncontrast exam.  Esophagus slightly patulous without wall thickening. No visualized thyroid nodule. Lungs/Pleura: Tiny left upper lobe pulmonary nodule series 3, image 33, not definitively seen on prior exam. Tiny right upper lobe pulmonary nodule series 3, image 22. Small subpleural ground-glass opacities in the right upper lobe, series 3 images 26 and 38. Similar but less prominent left upper lobe subpleural ground-glass opacity image 23. No confluent airspace disease. Small linear opacity in the anterior lingula likely scarring or atelectasis. No pulmonary mass. Azygos fissure again seen. There is central bronchial wall thickening. Upper Abdomen: Spleen appears prominent size, not entirely included in the field of view, spanning 13.8 cm AP. No acute findings. Musculoskeletal: There are no acute or suspicious osseous abnormalities. Mild degenerative change in the spine. IMPRESSION: 1. Mild central bronchial wall thickening, can be seen with bronchitis or reactive airways disease. 2. Small subpleural ground-glass opacities in the upper lobes is nonspecific. Findings may be post infectious or inflammatory. Alternatively, interstitial lung disease or chronic eosinophilic pneumonia could have a similar appearance. This could be reassessed at time of nodule follow-up, see below. 3. Tiny (less than 4 mm) right and left upper lobe pulmonary nodules, not definitively seen on prior exam. No follow-up needed if patient is low-risk. Non-contrast chest CT can be considered in 12 months if patient is high-risk. This recommendation follows the consensus statement: Guidelines for Management of Incidental Pulmonary Nodules Detected on CT Images: From the Fleischner Society 2017; Radiology 2017; 284:228-243. 4. Coronary artery calcifications. 5. Mild splenomegaly in the upper abdomen is  partially included. Aortic Atherosclerosis (ICD10-I70.0). Electronically Signed   By: Keith Rake M.D.   On: 06/07/2019 14:06   Dg Chest Port 1 View   Result Date: 06/30/2019 CLINICAL DATA:  Pneumothorax. EXAM: PORTABLE CHEST 1 VIEW COMPARISON:  06/29/2019. FINDINGS: Mediastinum hilar structures normal. Stable heart size. Mild right base subsegmental atelectasis/infiltrate. Stable small right apical pneumothorax. Prior cervical spine fusion. IMPRESSION: 1.  Stable small right apical pneumothorax. 2.  Mild right base subsegmental atelectasis/infiltrate. Electronically Signed   By: Marcello Moores  Register   On: 06/30/2019 09:27   Dg Chest Port 1 View  Result Date: 06/29/2019 CLINICAL DATA:  Pneumothorax after biopsy, follow-up EXAM: PORTABLE CHEST 1 VIEW COMPARISON:  Earlier same day FINDINGS: Slight decrease in size of small right apical pneumothorax. Lungs remain clear. Normal heart size. No pleural effusion. IMPRESSION: Slight decrease in small right apical pneumothorax. Electronically Signed   By: Macy Mis M.D.   On: 06/29/2019 17:05   Dg Chest Port 1 View  Result Date: 06/29/2019 CLINICAL DATA:  Acute respiratory failure. Pneumothorax. EXAM: PORTABLE CHEST 1 VIEW COMPARISON:  One-view chest x-ray 06/28/2019 FINDINGS: Small right apical pneumothorax has increased since the prior exam. Pneumothorax involves 10% of the right hemithorax. Left lung is clear. IMPRESSION: 1. Increasing right apical pneumothorax. These results will be called to the ordering clinician or representative by the Radiologist Assistant, and communication documented in the PACS or zVision Dashboard. Electronically Signed   By: San Morelle M.D.   On: 06/29/2019 07:28   Dg Chest Port 1 View  Result Date: 06/28/2019 CLINICAL DATA:  Pneumothorax on RIGHT. EXAM: PORTABLE CHEST 1 VIEW COMPARISON:  Chest x-ray from earlier same day. FINDINGS: Stable small RIGHT apical pneumothorax. No pleural effusion seen. Heart size and mediastinal contours are within normal limits. IMPRESSION: Stable small RIGHT apical pneumothorax. Electronically Signed   By: Franki Cabot M.D.   On:  06/28/2019 12:44   Dg Chest Port 1 View  Result Date: 06/28/2019 CLINICAL DATA:  Post bronchoscopy. Right-sided chest pain EXAM: PORTABLE CHEST 1 VIEW COMPARISON:  CT 06/07/2019 FINDINGS: Heart size is normal. Hazy opacity within the right upper lobe and medial right lung base. Small (less than 10%) right apical pneumothorax. No left-sided pneumothorax. No pleural effusion. No acute osseous findings. IMPRESSION: 1. Small (less than 10%) right apical pneumothorax. 2. Hazy opacity within the right upper lobe and medial right lung base, which may reflect post biopsy changes versus infection. These results will be called to the ordering clinician or representative by the Radiologist Assistant, and communication documented in the PACS or zVision Dashboard. Electronically Signed   By: Davina Poke M.D.   On: 06/28/2019 09:39   Ct Maxillofacial Ltd Wo Cm  Result Date: 06/07/2019 CLINICAL DATA:  Dental caries.  Cough and sinus drainage EXAM: CT PARANASAL SINUS LIMITED WITHOUT CONTRAST TECHNIQUE: Non-contiguous multidetector CT images of the paranasal sinuses were obtained in a single plane without contrast. COMPARISON:  None. FINDINGS: Mild mucosal edema right maxillary sinus. Remaining sinuses clear. No air-fluid level. Medial antrostomy left maxillary sinus. Resection of the inferior turbinates bilaterally. Nasal passage clear bilaterally. Concha bullosa middle turbinate on the right. No acute bony abnormality.  Limited soft tissue evaluation negative IMPRESSION: Mild mucosal edema right maxillary sinus. Prior sinus surgery Electronically Signed   By: Franchot Gallo M.D.   On: 06/07/2019 13:57   Dg C-arm Bronchoscopy  Result Date: 06/28/2019 C-ARM BRONCHOSCOPY: Fluoroscopy was utilized by the requesting physician.  No radiographic interpretation.    Lab  Results:  CBC    Component Value Date/Time   WBC 10.7 (H) 06/29/2019 0221   RBC 4.43 06/29/2019 0221   HGB 12.9 06/29/2019 0221   HGB 11.6  08/21/2017 1117   HGB 13.4 02/11/2016 0817   HCT 37.5 06/29/2019 0221   HCT 32.5 (L) 08/21/2017 1117   HCT 38.4 02/11/2016 0817   PLT 314 06/29/2019 0221   PLT 342 08/21/2017 1117   MCV 84.7 06/29/2019 0221   MCV 84 08/21/2017 1117   MCV 84.0 02/11/2016 0817   MCH 29.1 06/29/2019 0221   MCHC 34.4 06/29/2019 0221   RDW 12.6 06/29/2019 0221   RDW 15.4 08/21/2017 1117   RDW 14.0 02/11/2016 0817   LYMPHSABS 4.7 (H) 06/06/2019 1136   LYMPHSABS 3.8 (H) 08/21/2017 1117   LYMPHSABS 4.1 (H) 02/11/2016 0817   MONOABS 0.6 06/06/2019 1136   MONOABS 0.7 02/11/2016 0817   EOSABS 2.0 (H) 06/06/2019 1136   EOSABS 0.5 (H) 08/21/2017 1117   BASOSABS 0.2 (H) 06/06/2019 1136   BASOSABS 0.1 08/21/2017 1117   BASOSABS 0.0 02/11/2016 0817    BMET    Component Value Date/Time   NA 139 06/29/2019 0221   NA 142 08/21/2017 1117   NA 138 02/11/2016 0817   K 3.4 (L) 06/29/2019 0221   K 3.7 02/11/2016 0817   CL 104 06/29/2019 0221   CO2 26 06/29/2019 0221   CO2 23 02/11/2016 0817   GLUCOSE 204 (H) 06/29/2019 0221   GLUCOSE 280 (H) 02/11/2016 0817   BUN 14 06/29/2019 0221   BUN 13 08/21/2017 1117   BUN 8.2 02/11/2016 0817   CREATININE 0.77 06/29/2019 0221   CREATININE 0.8 02/11/2016 0817   CALCIUM 8.9 06/29/2019 0221   CALCIUM 9.7 02/11/2016 0817   GFRNONAA >60 06/29/2019 0221   GFRAA >60 06/29/2019 0221    BNP No results found for: BNP  ProBNP    Component Value Date/Time   PROBNP 9.0 08/31/2006 1114    Specialty Problems      Pulmonary Problems   Asthma with bronchitis    PFT 07/18/15-WNL-FVC 3.03/88%, FEV1 2.63/95%, ratio 0.87, TLC 105%, DLCO 113%       RHINOSINUSITIS, CHRONIC    Qualifier: Diagnosis of  By: [name] MD, Clinton D       Vocal cord dysfunction   Cough due to angiotensin-converting enzyme inhibitor    Relationship to ramipril suspected due to timing as of 07/08/2011      Snoring    Previous PSG- 1 positive, then next was negative. CPAP was taken away by  [facility].      OSA (obstructive sleep apnea)    NPSG 07/06/13 WNL- AHI 4/ hr; PLMAs 2.6/ hr      Dyspnea   Lobar pneumonia, unspecified organism ([place])   Acute respiratory failure (HCC)   ILD (interstitial lung disease) ([place])   Pneumothorax on right      Allergies  Allergen Reactions  . Influenza Vaccines Anaphylaxis and Swelling    Throat swelling, "flu" symptoms  . Iodinated Diagnostic Agents Shortness Of Breath, Nausea And Vomiting, Nausea Only and Rash  . Iodine-131 Shortness Of Breath, Nausea And Vomiting and Rash  . Iohexol Anaphylaxis, Hives and Swelling     Desc: hives,dyspnea; throat swelling; ok w/ premeds and omnipaque   . Levofloxacin Anaphylaxis and Hives  . Nucynta [Tapentadol Hydrochloride] Other (See Comments)    Hallucinations and  insomnia x3 days  . Pregabalin Other (See Comments)    (lyrica)Reaction-Syncope & unable to speak  .  Sulfonamide Derivatives Anaphylaxis  . Vantin [Cefpodoxime] Anaphylaxis, Hives, Rash and Cough  . Vilazodone Hcl Other (See Comments)    (Viibryd) Hallucinations  . Amoxicillin Hives and Itching    Did it involve swelling of the face/tongue/throat, SOB, or low BP? No Did it involve sudden or severe rash/hives, skin peeling, or any reaction on the inside of your mouth or nose? Yes Did you need to seek medical attention at a hospital or doctor's office? No When did it last happen?3-4 years ago If all above answers are "NO", may proceed with cephalosporin use. .  . Biaxin [Clarithromycin] Hives  . Carbamazepine Itching    (Tegretol)  . Ciprofloxacin Hives  . Dilaudid [Hydromorphone Hcl] Itching and Rash    redness  . Iodides Nausea Only  . Percocet [Oxycodone-Acetaminophen] Rash    Immunization History  Administered Date(s) Administered  . Influenza Split 07/08/2011, 04/19/2012  . Influenza Whole 05/03/2009, 04/27/2010  . Pneumococcal Polysaccharide-23 06/06/2015  . Tdap 08/28/2016    Past Medical History:  Diagnosis Date   . Allergy   . Anxiety   . Asthma   . Cancer ([place])    melanoma 2015 upper Left arm   . Chronic airway obstruction, not elsewhere classified   . Chronic pain    "q where; herniated disc in my tailbone" (02/09/2013)  . DDD (degenerative disc disease)    CERVIAL AND LUMBAR  . Depression   . Edema   . Fatty liver disease, nonalcoholic   . Fibromyalgia    "severe" (02/09/2013)  . Gastroparesis    from DM and chronic narcotic use  . GERD (gastroesophageal reflux disease)   . Human parvovirus infection   . Hyperlipidemia   . Hypertension   . Hypothyroidism   . Interstitial cystitis   . Migraine headache    "weekly; worse lately" (02/09/2013)  . OSA (obstructive sleep apnea)    "have mask;; don't use it; no one came out to check it" (02/09/2013)  . Osteoporosis   . Peripheral neuropathy    "severe" (02/09/2013)  . 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 PONV (postoperative nausea and vomiting)   . Positive PPD   . Shortness of breath    "at anytime; it's gotten worse recently" (02/09/2013)  . Sleep apnea   . Type II diabetes mellitus ([place])   . Vitamin D deficiency   . Vocal cord dysfunction     Tobacco History: Social History   Tobacco Use  Smoking Status Former Smoker  . Packs/day: 0.10  . Years: 3.00  . Pack years: 0.30  . Types: Cigarettes  . Quit date: 2010  . Years since quitting: 10.9  Smokeless Tobacco Never Used  Tobacco Comment   only smokes in high school then every now and then   Counseling given: Not Answered Comment: only smokes in high school then every now and then   Continue to not smoke  Outpatient Encounter Medications as of 07/01/2019  Medication Sig  . albuterol (VENTOLIN HFA) 108 (90 Base) MCG/ACT inhaler Inhale 2 puffs into the lungs every 6 (six) hours as needed for wheezing or shortness of breath.  [name] atorvastatin (LIPITOR) 20 MG tablet Take 20 mg by mouth daily.  . Cholecalciferol (VITAMIN D3)  1000 units CAPS Take 1,000 Units by mouth daily.  . diazepam (VALIUM) 10 MG tablet Take 10 mg by mouth 2 (two) times daily as needed (panic attacks.).   [name] diltiazem (CARDIZEM CD) 240 MG 24  hr capsule TAKE 1 CAPSULE BY MOUTH EVERY MORNING ON AN EMPTY STOMACH - [name] DENIED, FAXED DR Johnsie Cancel 10/22/15 SS (Patient taking differently: Take 240 mg by mouth daily. )  . FLUoxetine (PROZAC) 40 MG capsule Take 80 mg by mouth at bedtime.   . fluticasone furoate-vilanterol (BREO ELLIPTA) 200-25 MCG/INH AEPB Inhale 1 puff into the lungs daily.  [name] gabapentin (NEURONTIN) 300 MG capsule Take 600 mg by mouth at bedtime.   [name] HUMULIN R U-500 KWIKPEN 500 UNIT/ML kwikpen Inject 100 Units into the skin daily.   . hydrochlorothiazide (HYDRODIURIL) 25 MG tablet Take 25 mg by mouth daily.  . hydrOXYzine (ATARAX/VISTARIL) 25 MG tablet Take 50 mg by mouth at bedtime.  [name] ibuprofen (ADVIL,MOTRIN) 800 MG tablet Take 800 mg by mouth every 8 (eight) hours as needed (pain).   [name] ipratropium-albuterol (DUONEB) 0.5-2.5 (3) MG/3ML SOLN Take 65m by nebulization every 4-6 hours if needed (Patient taking differently: Inhale 3 mLs into the lungs every 4 (four) hours as needed (wheezing/shortness of breath.). )  . levothyroxine (SYNTHROID, LEVOTHROID) 75 MCG tablet Take 75 mcg by mouth daily before breakfast.  . liraglutide (VICTOZA) 18 MG/3ML SOPN Inject 1.2 mg into the skin every evening.   . montelukast (SINGULAIR) 10 MG tablet Take 1 tablet (10 mg total) by mouth at bedtime.  . nebivolol (BYSTOLIC) 10 MG tablet Take 1 tablet (10 mg total) by mouth daily.  .Marland Kitchennystatin-triamcinolone (MYCOLOG II) cream Apply 1 application topically 2 (two) times daily as needed (yeast in skin folds).   .Marland Kitchenomeprazole (PRILOSEC) 40 MG capsule Take 1 capsule (40 mg total) by mouth 2 (two) times daily. MUST HAVE OFFICE VISIT FOR FURTHER REFILLS (Patient taking differently: Take 40 mg by mouth daily before breakfast. )  . pentosan polysulfate (ELMIRON) 100 MG  capsule Take 100 mg by mouth 3 (three) times daily.  . SUMAtriptan (IMITREX) 20 MG/ACT nasal spray Place 20 mg into the nose every 2 (two) hours as needed for migraine or headache. May repeat in 2 hours if headache persists or recurs.  .Marland KitchentiZANidine (ZANAFLEX) 4 MG tablet Take 4 mg by mouth 3 (three) times daily. May hold one dose if needed to operate motor vehicle.  .Marland Kitchenacetaminophen (TYLENOL) 500 MG tablet Take 1 tablet (500 mg total) by mouth every 8 (eight) hours as needed for moderate pain.  . [DISCONTINUED] 0.9 %  sodium chloride infusion   . [DISCONTINUED] atorvastatin (LIPITOR) tablet 20 mg   . [DISCONTINUED] cholecalciferol (VITAMIN D3) tablet 1,000 Units   . [DISCONTINUED] diltiazem (CARDIZEM CD) 24 hr capsule 240 mg   . [DISCONTINUED] FLUoxetine (PROZAC) capsule 80 mg   . [DISCONTINUED] fluticasone furoate-vilanterol (BREO ELLIPTA) 200-25 MCG/INH 1 puff   . [DISCONTINUED] gabapentin (NEURONTIN) capsule 600 mg   . [DISCONTINUED] hydrochlorothiazide (HYDRODIURIL) tablet 25 mg   . [DISCONTINUED] hydrOXYzine (ATARAX/VISTARIL) tablet 50 mg   . [DISCONTINUED] ibuprofen (ADVIL) tablet 400 mg   . [DISCONTINUED] insulin aspart (novoLOG) injection 0-15 Units   . [DISCONTINUED] ipratropium-albuterol (DUONEB) 0.5-2.5 (3) MG/3ML nebulizer solution 3 mL   . [DISCONTINUED] levothyroxine (SYNTHROID) tablet 75 mcg   . [DISCONTINUED] montelukast (SINGULAIR) tablet 10 mg   . [DISCONTINUED] nebivolol (BYSTOLIC) tablet 10 mg   . [DISCONTINUED] oxyCODONE-acetaminophen (PERCOCET/ROXICET) 5-325 MG per tablet 1 tablet   . [DISCONTINUED] pantoprazole (PROTONIX) EC tablet 40 mg    No facility-administered encounter medications on file as of 07/01/2019.      Review of Systems  Review of Systems  Constitutional: Positive  for fatigue. Negative for activity change and fever.  HENT: Negative for sinus pressure, sinus pain and sore throat.   Respiratory: Positive for cough and shortness of breath. Negative for  wheezing.   Cardiovascular: Negative for chest pain and palpitations.  Gastrointestinal: Negative for diarrhea, nausea and vomiting.  Musculoskeletal: Negative for arthralgias.       Chest pain  Neurological: Negative for dizziness.  Psychiatric/Behavioral: Negative for sleep disturbance. The patient is not nervous/anxious.      Physical Exam  BP 120/70 (BP Location: Left Arm, Cuff Size: Normal)   Pulse 86   Temp (!) 97.5 F (36.4 C) (Temporal)   Ht _0  (1.575 m)   Wt 187 lb 6.4 oz (85 kg)   SpO2 97%   BMI 34.28 kg/m   Wt Readings from Last 5 Encounters:  07/01/19 187 lb 6.4 oz (85 kg)  06/30/19 190 lb 11.2 oz (86.5 kg)  06/06/19 193 lb (87.5 kg)  02/24/19 197 lb 12.8 oz (89.7 kg)  05/12/18 218 lb (98.9 kg)    BMI Readings from Last 5 Encounters:  07/01/19 34.28 kg/m  06/30/19 34.88 kg/m  06/06/19 32.12 kg/m  02/24/19 32.92 kg/m  05/12/18 39.87 kg/m     Physical Exam Vitals signs and nursing note reviewed.  Constitutional:      General: She is not in acute distress.    Appearance: Normal appearance. She is obese.  HENT:     Head: Normocephalic and atraumatic.     Right Ear: Tympanic membrane, ear canal and external ear normal. There is no impacted cerumen.     Left Ear: Tympanic membrane, ear canal and external ear normal. There is no impacted cerumen.     Nose: Rhinorrhea present. No congestion.     Mouth/Throat:     Mouth: Mucous membranes are moist.     Pharynx: Oropharynx is clear.     Comments: Postnasal drip Eyes:     Pupils: Pupils are equal, round, and reactive to light.  Neck:     Musculoskeletal: Normal range of motion.  Cardiovascular:     Rate and Rhythm: Normal rate and regular rhythm.     Pulses: Normal pulses.     Heart sounds: Normal heart sounds. No murmur.  Pulmonary:     Effort: Pulmonary effort is normal. No respiratory distress.     Breath sounds: No decreased air movement. No decreased breath sounds, wheezing or rales.      Comments: Coughing throughout exam Skin:    General: Skin is warm and dry.     Capillary Refill: Capillary refill takes less than 2 seconds.  Neurological:     General: No focal deficit present.     Mental Status: She is alert and oriented to person, place, and time. Mental status is at baseline.     Gait: Gait normal.  Psychiatric:        Mood and Affect: Mood normal.        Behavior: Behavior normal.        Thought Content: Thought content normal.        Judgment: Judgment normal.       Assessment & Plan:   Asthma with bronchitis Plan: Continue Breo Ellipta 200 Continue to not smoke Follow-up with Dr. [name] in 4 weeks May need to consider allergy referral in the future due to eosinophilia  ILD (interstitial lung disease) ([place]) Status post bronchoscopy on 06/28/2019, BAL showing elevated lymphocytes, transbronchial biopsies nondiagnostic Positive exposures to chickens  Discussion: Discussed  case with Dr. Vaughan Browner as well as Dr. [name].  Due to mild findings on CT chest as well as nondiagnostic transbronchial biopsy.  We will continue to monitor the patient clinically.  Do not believe this is a indication to empirically treat with steroids.  Patient has been given some significant amounts of corticosteroids in the past with these ongoing symptoms and has not seen improvement.  Plan: Follow-up with our office in 4 weeks after lab work is completely resulted Complete ILD questionnaire today   OSA (obstructive sleep apnea) Plan: Continue CPAP therapy as outlined   Pneumothorax on right Plan: Follow-up chest x-ray in 4 weeks Continue to monitor the patient clinically Patient can use ibuprofen and Tylenol to help with management of pain    Return in about 4 weeks (around 07/29/2019), or if symptoms worsen or fail to improve, for Follow up with Dr. [name], With Chest Xray.   Lauraine Rinne, NP 07/01/2019   This appointment was 32 minutes long with over 50% of the time in  direct face-to-face patient care, assessment, plan of care, and follow-up.

## 2019-06-30 NOTE — Progress Notes (Signed)
  SATURATION QUALIFICATIONS: (This note is used to comply with regulatory documentation for home oxygen)  Patient Saturations on Room Air at Rest =92%  Patient Saturations on Room Air while Ambulating =94%  Patient Saturations on 0 Liters of oxygen while Ambulating =95%  Pt ambulated 470 feet on r/a. Tolerated well. Sat always above 92%.   Jerald Kief, RN

## 2019-06-30 NOTE — Telephone Encounter (Signed)
Will go ahead and order CXR for tomorrow's visit. Will close encounter.

## 2019-06-30 NOTE — Progress Notes (Signed)
NAME:  Cheryl [name], MRN:  [id number], DOB:  [birth year], LOS: 0 ADMISSION DATE:  06/28/2019, CONSULTATION DATE:  12/1 REFERRING MD:  Dr. Vaughan Browner, CHIEF COMPLAINT:  Pneumothorax post bronch    Brief History    [name] is a 53 year old with history of asthma, peripheral eosinophilia, and former smoker. She is a patient of Dr. [name] and Wyn Quaker, NP. Presented for outpatient Bronchoscopy 12/1 with Dr. Orma Flaming in order to rule out eosinophilic pneumonia. Elective bronchoscopy was preformed with bronchoalveolar lavaged as well as tracheobronchial biopsies were obtained. Due to complaints of right-sided chest pain,  post procedure chest x-ray was preformed and revealed small (less than 10%) pneumothorax therefore patient was admitted for further observation.   Repeat chest x-ray 12/2 with slight increased size of pneumothorax   Past Medical History  Asthma Melanoma 2015 Chronic airway obstruction DDD Depression Nonalcoholic fatty liver disease GERD Human parvovirus Hypertension Hyperlipidemia Hypothyroidism OSA noncompliant with CPAP Type II diabetes  Vocal cord dysfunction   Significant Hospital Events   12/1 outpatient bronchoscopy, admitted due to pneumothorax  12/2 - RN reports no acute events overnight, patient reports come continued right-sided chest pain but denies any acute dyspnea.   Consults:    Procedures:  12/1 > bronchoscopy   Significant Diagnostic Tests:   BAL 12/1 - Results for Cheryl [name], Cheryl "RENEE" (MRN [id number]) as of 06/30/2019 10:08  TTBX - NON DIAGNOSTIC with 33% lympnths  Ref. Range 06/28/2019 08:08  Color, Fluid Latest Ref Range: YELLOW  COLORLESS (A)  Total Nucleated Cell Count, Fluid Latest Ref Range: 0 - 1,000 cu mm 178  Lymphs, Fluid Latest Units: % 33  Eos, Fluid Latest Units: % 5  Appearance, Fluid Latest Ref Range: CLEAR  HAZY (A)  Other Cells, Fluid Latest Units: % MESOTHELIAL CELLS.  Neutrophil Count, Fluid Latest Ref Range: 0  - 25 % 12  Monocyte-Macrophage-Serous Fluid Latest Ref Range: 50 - 90 % 50    CXR 12/1 > Small (less than 10%) right apical pneumothorax. Hazy opacity within the right upper lobe and medial right lung base, which may reflect post biopsy changes versus infection.  CXR 12/2 > Increasing right apical pneumothorax.  Micro Data:  BAL culture 12/1 >  AFB smear 12/1 > negative  BAL fungal culture 12/1 >  Antimicrobials:     Interim history/subjective:    06/30/2019 - 33% lymphs on BAL/. TTBx I snon diagnostic. Feels better. Denies chest tube .CXR with ptx resolution in my visualiztation (small stable per radiologist). Regarding lung GGO - > has expensive pillows but does not know if is down. HAs 14 chickens outside home but does not interact with them or go into coop.   Says inhalers helped  Objective   Blood pressure 122/77, pulse 70, temperature (!) 97.3 F (36.3 C), temperature source Oral, resp. rate 19, height _0  (1.575 m), weight 86.5 kg, SpO2 94 %.    FiO2 (%):  [24 %] 24 %   Intake/Output Summary (Last 24 hours) at 06/30/2019 1010 Last data filed at 06/29/2019 1300 Gross per 24 hour  Intake 120 ml  Output --  Net 120 ml   Filed Weights   06/28/19 1800 06/29/19 0500 06/30/19 0424  Weight: 86 kg 86 kg 86.5 kg     General Appearance:  Looks well. Obese + Head:  Normocephalic, without obvious abnormality, atraumatic Eyes:  PERRL - yes, conjunctiva/corneas - clear     Ears:  Normal external ear canals, both ears Nose:  G  tube - no Throat:  ETT TUBE - no , OG tube - no Neck:  Supple,  No enlargement/tenderness/nodules Lungs: Clear to auscultation bilaterally,  Heart:  S1 and S2 normal, no murmur, CVP - no.  Pressors - no Abdomen:  Soft, no masses, no organomegaly Genitalia / Rectal:  Not done Extremities:  Extremities- intact Skin:  ntact in exposed areas . Neurologic:  Sedation - none -> RASS - +1 . Moves all 4s - yes. CAM-ICU - neg . Orientation - x3+   LABS     PULMONARY No results for input(s): PHART, PCO2ART, PO2ART, HCO3, TCO2, O2SAT in the last 168 hours.  Invalid input(s): PCO2, PO2  CBC Recent Labs  Lab 06/29/19 0221  HGB 12.9  HCT 37.5  WBC 10.7*  PLT 314    COAGULATION No results for input(s): INR in the last 168 hours.  CARDIAC  No results for input(s): TROPONINI in the last 168 hours. No results for input(s): PROBNP in the last 168 hours.   CHEMISTRY Recent Labs  Lab 06/29/19 0221  NA 139  K 3.4*  CL 104  CO2 26  GLUCOSE 204*  BUN 14  CREATININE 0.77  CALCIUM 8.9  MG 1.9  PHOS 3.8   Estimated Creatinine Clearance: 84 mL/min (by C-G formula based on SCr of 0.77 mg/dL).   LIVER No results for input(s): AST, ALT, ALKPHOS, BILITOT, PROT, ALBUMIN, INR in the last 168 hours.   INFECTIOUS No results for input(s): LATICACIDVEN, PROCALCITON in the last 168 hours.   ENDOCRINE CBG (last 3)  Recent Labs    06/29/19 1616 06/29/19 2113 06/30/19 0618  GLUCAP 227* 179* 173*         IMAGING x48h  - image(s) personally visualized  -   highlighted in bold - In  Dg Chest Port 1 View  Result Date: 06/30/2019 CLINICAL DATA:  Pneumothorax. EXAM: PORTABLE CHEST 1 VIEW COMPARISON:  06/29/2019. FINDINGS: Mediastinum hilar structures normal. Stable heart size. Mild right base subsegmental atelectasis/infiltrate. Stable small right apical pneumothorax. Prior cervical spine fusion. IMPRESSION: 1.  Stable small right apical pneumothorax. 2.  Mild right base subsegmental atelectasis/infiltrate. Electronically Signed   By: Marcello Moores  Register   On: 06/30/2019 09:27  - clinical MD not able to appreciate this PTX  Dg Chest Port 1 View  Result Date: 06/29/2019 CLINICAL DATA:  Pneumothorax after biopsy, follow-up EXAM: PORTABLE CHEST 1 VIEW COMPARISON:  Earlier same day FINDINGS: Slight decrease in size of small right apical pneumothorax. Lungs remain clear. Normal heart size. No pleural effusion. IMPRESSION: Slight decrease  in small right apical pneumothorax. Electronically Signed   By: Macy Mis M.D.   On: 06/29/2019 17:05   Dg Chest Port 1 View  Result Date: 06/29/2019 CLINICAL DATA:  Acute respiratory failure. Pneumothorax. EXAM: PORTABLE CHEST 1 VIEW COMPARISON:  One-view chest x-ray 06/28/2019 FINDINGS: Small right apical pneumothorax has increased since the prior exam. Pneumothorax involves 10% of the right hemithorax. Left lung is clear. IMPRESSION: 1. Increasing right apical pneumothorax. These results will be called to the ordering clinician or representative by the Radiologist Assistant, and communication documented in the PACS or zVision Dashboard. Electronically Signed   By: San Morelle M.D.   On: 06/29/2019 07:28   Dg Chest Port 1 View  Result Date: 06/28/2019 CLINICAL DATA:  Pneumothorax on RIGHT. EXAM: PORTABLE CHEST 1 VIEW COMPARISON:  Chest x-ray from earlier same day. FINDINGS: Stable small RIGHT apical pneumothorax. No pleural effusion seen. Heart size and mediastinal contours are within  normal limits. IMPRESSION: Stable small RIGHT apical pneumothorax. Electronically Signed   By: Franki Cabot M.D.   On: 06/28/2019 12:44       Resolved Hospital Problem list     Assessment & Plan:  Workup for interstitial lung disease  -faint GGP bilateral apices.   - BAL with 33% lymphs. Non diagnostc Tbbx  P: ILD questionnaire as opd - eliicity for HP antigens (she has chicken and unclear down pillow usage) Check ACE 06/30/2019 Check autoimmune seriologyut 06/30/2019 Check vasculitis panel 06/30/2019 Check HP panel 06/30/2019 Walk test RA for o2 06/30/2019 - prior to dc  Pneumothorax post bronchoscopy  06/30/2019 - nearly resolved v small apical on cxr today. On Obs Rx only since 06/28/2019  .   Plan - dc home with early followup in office in 1-2 days with cxr - avoid valasalva maneuvers and straining and lifting heavy objects    Type 2 Diabetes P: SSI Hypoglycemia protocol as  needed   Hyperlipidemia  P: Continue home statin   Hypertension -Per chart review no history of arrhythmia  P: Continue home cardizem and bystolic   Hypothyroidism  P: Continue home synthroid   Best practice:  Diet: Heart healthy  Pain/Anxiety/Delirium protocol (if indicated): PRN oxy  VAP protocol (if indicated): N/A DVT prophylaxis: SCD GI prophylaxis: N/A Glucose control: SSI Mobility: up with assistance  Code Status: Full Family Communication: patient  Disposition:  Home 06/30/2019 [place] [address] - Go to ER if worse   FU - 1 days with CXR  Future Appointments  Date Time Provider [place]  07/01/2019 10:30 AM Parrett, Fonnie Mu, NP [facility] None   > 30 min in dc planning  LABS    PULMONARY No results for input(s): PHART, PCO2ART, PO2ART, HCO3, TCO2, O2SAT in the last 168 hours.  Invalid input(s): PCO2, PO2  CBC Recent Labs  Lab 06/29/19 0221  HGB 12.9  HCT 37.5  WBC 10.7*  PLT 314    COAGULATION No results for input(s): INR in the last 168 hours.  CARDIAC  No results for input(s): TROPONINI in the last 168 hours. No results for input(s): PROBNP in the last 168 hours.   CHEMISTRY Recent Labs  Lab 06/29/19 0221  NA 139  K 3.4*  CL 104  CO2 26  GLUCOSE 204*  BUN 14  CREATININE 0.77  CALCIUM 8.9  MG 1.9  PHOS 3.8   Estimated Creatinine Clearance: 84 mL/min (by C-G formula based on SCr of 0.77 mg/dL).   LIVER No results for input(s): AST, ALT, ALKPHOS, BILITOT, PROT, ALBUMIN, INR in the last 168 hours.   INFECTIOUS No results for input(s): LATICACIDVEN, PROCALCITON in the last 168 hours.   ENDOCRINE CBG (last 3)  Recent Labs    06/29/19 1616 06/29/19 2113 06/30/19 0618  GLUCAP 227* 179* 173*         IMAGING x48h  - image(s) personally visualized  -   highlighted in bold Dg Chest Port 1 View  Result Date: 06/30/2019 CLINICAL DATA:  Pneumothorax. EXAM: PORTABLE CHEST 1 VIEW  COMPARISON:  06/29/2019. FINDINGS: Mediastinum hilar structures normal. Stable heart size. Mild right base subsegmental atelectasis/infiltrate. Stable small right apical pneumothorax. Prior cervical spine fusion. IMPRESSION: 1.  Stable small right apical pneumothorax. 2.  Mild right base subsegmental atelectasis/infiltrate. Electronically Signed   By: Marcello Moores  Register   On: 06/30/2019 09:27   Dg Chest Port 1 View  Result Date: 06/29/2019 CLINICAL DATA:  Pneumothorax after biopsy, follow-up EXAM:  PORTABLE CHEST 1 VIEW COMPARISON:  Earlier same day FINDINGS: Slight decrease in size of small right apical pneumothorax. Lungs remain clear. Normal heart size. No pleural effusion. IMPRESSION: Slight decrease in small right apical pneumothorax. Electronically Signed   By: Macy Mis M.D.   On: 06/29/2019 17:05   Dg Chest Port 1 View  Result Date: 06/29/2019 CLINICAL DATA:  Acute respiratory failure. Pneumothorax. EXAM: PORTABLE CHEST 1 VIEW COMPARISON:  One-view chest x-ray 06/28/2019 FINDINGS: Small right apical pneumothorax has increased since the prior exam. Pneumothorax involves 10% of the right hemithorax. Left lung is clear. IMPRESSION: 1. Increasing right apical pneumothorax. These results will be called to the ordering clinician or representative by the Radiologist Assistant, and communication documented in the PACS or zVision Dashboard. Electronically Signed   By: San Morelle M.D.   On: 06/29/2019 07:28   Dg Chest Port 1 View  Result Date: 06/28/2019 CLINICAL DATA:  Pneumothorax on RIGHT. EXAM: PORTABLE CHEST 1 VIEW COMPARISON:  Chest x-ray from earlier same day. FINDINGS: Stable small RIGHT apical pneumothorax. No pleural effusion seen. Heart size and mediastinal contours are within normal limits. IMPRESSION: Stable small RIGHT apical pneumothorax. Electronically Signed   By: Franki Cabot M.D.   On: 06/28/2019 12:44

## 2019-07-01 ENCOUNTER — Ambulatory Visit (INDEPENDENT_AMBULATORY_CARE_PROVIDER_SITE_OTHER): Payer: Medicare HMO

## 2019-07-01 ENCOUNTER — Encounter: Payer: Self-pay | Admitting: Pulmonary Disease

## 2019-07-01 ENCOUNTER — Ambulatory Visit (INDEPENDENT_AMBULATORY_CARE_PROVIDER_SITE_OTHER): Payer: Medicare HMO | Admitting: Pulmonary Disease

## 2019-07-01 ENCOUNTER — Other Ambulatory Visit: Payer: Self-pay

## 2019-07-01 ENCOUNTER — Ambulatory Visit: Payer: Medicare HMO | Admitting: Adult Health

## 2019-07-01 VITALS — BP 120/70 | HR 86 | Temp 97.5°F | Ht 62.0 in | Wt 187.4 lb

## 2019-07-01 DIAGNOSIS — J939 Pneumothorax, unspecified: Secondary | ICD-10-CM

## 2019-07-01 DIAGNOSIS — J849 Interstitial pulmonary disease, unspecified: Secondary | ICD-10-CM | POA: Diagnosis not present

## 2019-07-01 DIAGNOSIS — J45909 Unspecified asthma, uncomplicated: Secondary | ICD-10-CM

## 2019-07-01 DIAGNOSIS — G4733 Obstructive sleep apnea (adult) (pediatric): Secondary | ICD-10-CM | POA: Diagnosis not present

## 2019-07-01 LAB — SJOGRENS SYNDROME-B EXTRACTABLE NUCLEAR ANTIBODY: SSB (La) (ENA) Antibody, IgG: <0.2 AI (ref 0.0–0.9)

## 2019-07-01 LAB — ANGIOTENSIN CONVERTING ENZYME: Angiotensin-Converting Enzyme: 30 U/L (ref 14–82)

## 2019-07-01 LAB — GLOMERULAR BASEMENT MEMBRANE ANTIBODIES: GBM Ab: 2 units (ref 0–20)

## 2019-07-01 LAB — SJOGRENS SYNDROME-A EXTRACTABLE NUCLEAR ANTIBODY: SSA (Ro) (ENA) Antibody, IgG: <0.2 AI (ref 0.0–0.9)

## 2019-07-01 LAB — ANTI-SCLERODERMA ANTIBODY: Scleroderma (Scl-70) (ENA) Antibody, IgG: <0.2 AI (ref 0.0–0.9)

## 2019-07-01 LAB — RHEUMATOID FACTOR: Rheumatoid fact SerPl-aCnc: <10 IU/mL (ref 0.0–13.9)

## 2019-07-01 MED ORDER — ACETAMINOPHEN 500 MG PO TABS: 500.0000 mg | ORAL_TABLET | Freq: Three times a day (TID) | ORAL | 6 refills | Status: DC | PRN

## 2019-07-01 NOTE — Assessment & Plan Note (Signed)
Plan: Continue CPAP therapy as outlined

## 2019-07-01 NOTE — Patient Instructions (Addendum)
You were seen today by Lauraine Rinne, NP  for:   1. Pneumothorax on right  - acetaminophen (TYLENOL) 500 MG tablet; Take 1 tablet (500 mg total) by mouth every 8 (eight) hours as needed for moderate pain.  Dispense: 30 tablet; Refill: 6  We will continue to follow the pneumothorax that should start to resolve on its own.  We will repeat a chest x-ray at next follow-up visit with Dr. [name]  Can use ibuprofen 800 mg every 8 hours as needed for pain Can use Tylenol 500 mg every 8 hours as needed for pain  2. ILD (interstitial lung disease) ([place])  Lab work is still pending.  We will follow up with you as those results developed.  We will bring you back in to see Dr. [name] at his next earliest available appointment to review this blood work.  Hopefully this follow-up should be within 2 to 4 weeks  3. OSA (obstructive sleep apnea)  Continue to try to use your CPAP the best that she can.  I understand that with the cough this is been difficult.  4. Asthma   Breo Ellipta 200 >>> Take 1 puff daily in the morning right when you wake up >>>Rinse your mouth out after use >>>This is a daily maintenance inhaler, NOT a rescue inhaler >>>Contact our office if you are having difficulties affording or obtaining this medication >>>It is important for you to be able to take this daily and not miss any doses       We recommend today:  No orders of the defined types were placed in this encounter.  No orders of the defined types were placed in this encounter.  Meds ordered this encounter  Medications  . acetaminophen (TYLENOL) 500 MG tablet    Sig: Take 1 tablet (500 mg total) by mouth every 8 (eight) hours as needed for moderate pain.    Dispense:  30 tablet    Refill:  6    Follow Up:    Return in about 4 weeks (around 07/29/2019), or if symptoms worsen or fail to improve, for Follow up with Dr. [name], With Chest Xray.   Please do your part to reduce the spread of COVID-19:       Reduce your risk of any infection  and COVID19 by using the similar precautions used for avoiding the common cold or flu:  [name] Wash your hands often with soap and warm water for at least 20 seconds.  If soap and water are not readily available, use an alcohol-based hand sanitizer with at least 60% alcohol.  . If coughing or sneezing, cover your mouth and nose by coughing or sneezing into the elbow areas of your shirt or coat, into a tissue or into your sleeve (not your hands). Langley Gauss A MASK when in public  . Avoid shaking hands with others and consider head nods or verbal greetings only. . Avoid touching your eyes, nose, or mouth with unwashed hands.  . Avoid close contact with people who are sick. . Avoid places or events with large numbers of people in one location, like concerts or sporting events. . If you have some symptoms but not all symptoms, continue to monitor at home and seek medical attention if your symptoms worsen. . If you are having a medical emergency, call 911.   [place] / e-Visit: [email or web address]         [facility]: [phone]  [facility]: [phone]                   [facility]: [phone]     It is flu season:   >>> Best ways to protect herself from the flu: Receive the yearly flu vaccine, practice good hand hygiene washing with soap and also using hand sanitizer when available, eat a nutritious meals, get adequate rest, hydrate appropriately   Please contact the office if your symptoms worsen or you have concerns that you are not improving.   Thank you for choosing [facility] for your healthcare, and for allowing [name] to partner with you on your healthcare journey. I am thankful to be able to provide care to you today.   Wyn Quaker FNP-C

## 2019-07-01 NOTE — Assessment & Plan Note (Signed)
Status post bronchoscopy on 06/28/2019, BAL showing elevated lymphocytes, transbronchial biopsies nondiagnostic Positive exposures to chickens  Discussion: Discussed case with Dr. Vaughan Browner as well as Dr. [name].  Due to mild findings on CT chest as well as nondiagnostic transbronchial biopsy.  We will continue to monitor the patient clinically.  Do not believe this is a indication to empirically treat with steroids.  Patient has been given some significant amounts of corticosteroids in the past with these ongoing symptoms and has not seen improvement.  Plan: Follow-up with our office in 4 weeks after lab work is completely resulted Complete ILD questionnaire today

## 2019-07-01 NOTE — Assessment & Plan Note (Signed)
Plan: Follow-up chest x-ray in 4 weeks Continue to monitor the patient clinically Patient can use ibuprofen and Tylenol to help with management of pain

## 2019-07-01 NOTE — Assessment & Plan Note (Signed)
Plan: Continue Breo Ellipta 200 Continue to not smoke Follow-up with Dr. [name] in 4 weeks May need to consider allergy referral in the future due to eosinophilia

## 2019-07-02 LAB — CYCLIC CITRUL PEPTIDE ANTIBODY, IGG/IGA: CCP Antibodies IgG/IgA: 6 units (ref 0–19)

## 2019-07-02 LAB — ANTINUCLEAR ANTIBODIES, IFA: ANA Ab, IFA: NEGATIVE

## 2019-07-03 LAB — MPO/PR-3 (ANCA) ANTIBODIES
ANCA Proteinase 3: <3.5 U/mL (ref 0.0–3.5)
Myeloperoxidase Abs: <9 U/mL (ref 0.0–9.0)

## 2019-07-03 NOTE — Progress Notes (Signed)
Discussed results with patient in office.  Nothing further is needed at this time.  Dorthey Depace FNP  

## 2019-07-04 ENCOUNTER — Ambulatory Visit: Payer: Medicare HMO

## 2019-07-04 ENCOUNTER — Ambulatory Visit: Payer: Medicare HMO | Admitting: Pulmonary Disease

## 2019-07-05 LAB — HYPERSENSITIVITY PNEUMONITIS
A. Pullulans Abs: NEGATIVE
A.Fumigatus #1 Abs: NEGATIVE
Micropolyspora faeni, IgG: NEGATIVE
Pigeon Serum Abs: NEGATIVE
Thermoact. Saccharii: NEGATIVE
Thermoactinomyces vulgaris, IgG: NEGATIVE

## 2019-07-25 ENCOUNTER — Other Ambulatory Visit: Payer: Self-pay | Admitting: *Deleted

## 2019-07-25 DIAGNOSIS — J939 Pneumothorax, unspecified: Secondary | ICD-10-CM

## 2019-07-26 ENCOUNTER — Ambulatory Visit (INDEPENDENT_AMBULATORY_CARE_PROVIDER_SITE_OTHER): Payer: Medicare HMO

## 2019-07-26 ENCOUNTER — Encounter: Payer: Self-pay | Admitting: Internal Medicine

## 2019-07-26 ENCOUNTER — Other Ambulatory Visit: Payer: Self-pay

## 2019-07-26 ENCOUNTER — Ambulatory Visit: Payer: Medicare HMO | Admitting: Internal Medicine

## 2019-07-26 DIAGNOSIS — J939 Pneumothorax, unspecified: Secondary | ICD-10-CM

## 2019-07-26 DIAGNOSIS — K219 Gastro-esophageal reflux disease without esophagitis: Secondary | ICD-10-CM | POA: Diagnosis not present

## 2019-07-26 DIAGNOSIS — J45909 Unspecified asthma, uncomplicated: Secondary | ICD-10-CM | POA: Diagnosis not present

## 2019-07-26 NOTE — Patient Instructions (Signed)
Sample x 2 Trelegy 200     Inhale 1 puff, then rinse mouth well, once daily  Try this instead of Breo. If Trelegy seems to work better, please call and let us know, so we can send in a mail order prescription for it. If it is no better than the [facility] you are using now, you can just go back to [place].   Ok not to take Singulair

## 2019-07-26 NOTE — Assessment & Plan Note (Signed)
Breo insufficient. Didn't want to try Singulair because of some adverse experience in past. Plan- Try Trelogy. Consider Biologic since persistent eosinophilia. Consider Accolate instead of singulair.

## 2019-07-26 NOTE — Progress Notes (Signed)
Subjective:    Patient ID: Cheryl [name], female    DOB: 06/22/1966, 53 y.o.   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 PFT 07/18/15-WNL-FVC 3.03/88%, FEV1 2.63/95%, ratio 0.87, TLC 105%, DLCO 113% Echocardiogram 02/12/15-EF 60-65 percent, grade 1 diastolic dysfunction Video bronchoscopy 06/28/2019- for evaluation of ground glass and tiny nodules, with concern of possible eosinophilic pneumonia, nonspecific with histiocytes noted- no malignancy or eosinophils. 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 Pt having hard time using cpap machine each night, last week couldn't use the machine. Pt requests new cpap mask, and states she wakes up worse when using machine. Pt feels she has pressure and fluid in her chest, she thinks could be pneuomonia. Download 37% compliance, AHI 10.3/hour.  Auto set 5-10.  Pressure ranging 7.9-9.6.  Most residual events are obstructive. She says she "suffocates at times" with her CPAP.  She may feel short of breath going to bed or wake that way during the night.  Occasional palpitation-pending cardiac cath for evaluation. Substernal soreness through to back goes.  Incidental pulled left back muscle recently-heating pad. Using Advair 115 HFA, Ventolin rescue inhaler about twice a week,  nebulizer DuoNeb rarely. Little sputum, mostly "tightness".  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 Complicated by R PTX She complains of chronic cough at least since September. Neb helps. Not using Singulair- says "had problems with it before".  Denies rash or adenopathy.   Admits reflux awareness. Not using CPAP currently. Sleeps propped up. Has gastropoesis, working with GI. Burps up "rotten eggs". Lab 06/30/2019- Hypersensitivity pneumonia-Neg, ESR 4, ILD panel- NEG, IgE 373 H,  EOS 15.4 H 06/06/2019 CT chest 06/07/2019-  IMPRESSION: 1. Mild central bronchial wall thickening, can be seen with bronchitis or reactive airways disease. 2. Small subpleural ground-glass opacities in the upper lobes is nonspecific. Findings may be post infectious or inflammatory. Alternatively, interstitial lung disease or chronic eosinophilic pneumonia could have a similar appearance. This could be reassessed at time of nodule follow-up, see below. 3. Tiny (less than 4 mm) right and left upper lobe pulmonary nodules, not definitively seen on prior exam. No follow-up needed if patient is low-risk. Non-contrast chest CT can be considered in 12 months if patient is high-risk. This recommendation follows the consensus statement: Guidelines for Management of Incidental Pulmonary Nodules Detected on CT Images: From the Fleischner Society 2017; Radiology 2017; 284:228-243. 4. Coronary artery calcifications. 5. Mild  splenomegaly in the upper abdomen is partially included.  Review of Systems- see HPI   + = positive Constitutional:   No-   weight loss, night sweats, fevers, chills,+irregular sleep schedule, +fatigue, lassitude. HEENT:   No-headaches, difficulty swallowing, tooth/dental problems, +sore throat,       No-  sneezing, itching, +ear ache, nasal congestion, post nasal drip,  CV:  chest pain, orthopnea, PND, swelling in lower extremities, anasarca,  dizziness, +palpitations Resp: + shortness of breath with exertion or at rest.              No- productive cough,   non-productive cough,  No- coughing up of blood.              No- change in color of mucus.   Skin: No-   rash or lesions. GI:  No-   heartburn, indigestion, abdominal pain, nausea, vomiting,  GU:  MS:  No-   joint pain or swelling.   Neuro-    +tremor Psych:  No- change in mood or affect. + depression or anxiety.  No memory loss.  Objective:   Physical Exam      OBJ- Physical Exam General- Alert, Oriented, + Somewhat anxious, Distress- none acute. + overweight Skin- rash-none, lesions- none, excoriation- none Lymphadenopathy- none Head- atraumatic            Eyes- Gross vision intact, PERRLA, conjunctivae and secretions clear            Ears- Hearing, canals-normal            Nose-  turbinate edema-none, no-Septal dev, mucus, polyps, erosion, perforation             Throat- Mallampati II , mucosa clear , drainage- none, tonsils- atrophic,  Neck- flexible , trachea midline, no stridor , thyroid nl, carotid no bruit Chest - symmetrical excursion , unlabored           Heart/CV- RRR , no murmur , no gallop  , no rub, nl s1 s2                           - JVD- none , edema- none, stasis changes- none, varices- none           Lung-  Wheeze+ mild, cough-None , dullness-none, rub- none           Chest wall-  Abd-  Br/ Gen/ Rectal- Not done, not indicated Extrem- cyanosis- none, clubbing, none, atrophy- none, strength- nl Neuro-  Unremarkable to observation

## 2019-07-26 NOTE — Assessment & Plan Note (Signed)
Suspect chronic microaspiration. Working with GI. Emphasis on reflux precautions, acid blockers.

## 2019-07-26 NOTE — Assessment & Plan Note (Signed)
Resolving without chest tube.

## 2019-07-27 LAB — FUNGUS CULTURE RESULT

## 2019-07-27 LAB — FUNGAL ORGANISM REFLEX

## 2019-07-27 LAB — FUNGUS CULTURE WITH STAIN

## 2019-07-28 MED ORDER — TRELEGY ELLIPTA 100-62.5-25 MCG/INH IN AEPB: 1.0000 | INHALATION_SPRAY | Freq: Every day | RESPIRATORY_TRACT | 0 refills | Status: DC

## 2019-07-28 NOTE — Addendum Note (Signed)
Addended by: June Leap on: 07/28/2019 12:09 PM   Modules accepted: Orders

## 2019-08-05 ENCOUNTER — Other Ambulatory Visit: Payer: Self-pay | Admitting: Pulmonary Disease

## 2019-08-10 LAB — ACID FAST CULTURE WITH REFLEXED SENSITIVITIES (MYCOBACTERIA): Acid Fast Culture: NEGATIVE

## 2019-09-27 ENCOUNTER — Other Ambulatory Visit: Payer: Self-pay

## 2019-09-27 NOTE — Patient Outreach (Signed)
[facility]) Care Management  [place]  09/27/2019   Cheryl [name] 26-Jan-1966 [id number]  Subjective: Return phone call to patient. She states she is doing good.  Continues to manage her diabetes. Patient requesting [facility] calendar and agreeable to services again. Patient is independent with care but has daughter [name] as support.   Discussed her long standing diabetes and other health issues.  However, stressed the importance of continued diabetic control.  She verbalized understanding.  Patient is on the APL and eligible for [facility] services for disease management.     Objective:   Encounter Medications:  Outpatient Encounter Medications as of 09/27/2019  Medication Sig Note  . acetaminophen (TYLENOL) 500 MG tablet Take 1 tablet (500 mg total) by mouth every 8 (eight) hours as needed for moderate pain.   [name] albuterol (VENTOLIN HFA) 108 (90 Base) MCG/ACT inhaler INHALE 2 PUFFS EVERY 6 HOURS AS NEEDED FOR WHEEZING OR SHORTNESS OF BREATH.   [name] atorvastatin (LIPITOR) 20 MG tablet Take 20 mg by mouth daily.   . Cholecalciferol (VITAMIN D3) 1000 units CAPS Take 1,000 Units by mouth daily.   . diazepam (VALIUM) 10 MG tablet Take 10 mg by mouth 2 (two) times daily as needed (panic attacks.).    [name] diltiazem (CARDIZEM CD) 240 MG 24 hr capsule TAKE 1 CAPSULE BY MOUTH EVERY MORNING ON AN EMPTY STOMACH - [name] DENIED, FAXED DR Johnsie Cancel 10/22/15 SS (Patient taking differently: Take 240 mg by mouth daily. )   . FLUoxetine (PROZAC) 40 MG capsule Take 80 mg by mouth at bedtime.    . fluticasone furoate-vilanterol (BREO ELLIPTA) 200-25 MCG/INH AEPB Inhale 1 puff into the lungs daily.   . Fluticasone-Umeclidin-Vilant (TRELEGY ELLIPTA) 100-62.5-25 MCG/INH AEPB Inhale 1 puff into the lungs daily.   [name] gabapentin (NEURONTIN) 300 MG capsule Take 600 mg by mouth at bedtime.    [name] HUMULIN R U-500 KWIKPEN 500 UNIT/ML kwikpen Inject 100 Units into the skin daily.  09/27/2019: Taking 90 in am and 50  in pm  . hydrochlorothiazide (HYDRODIURIL) 25 MG tablet Take 25 mg by mouth daily.   . hydrOXYzine (ATARAX/VISTARIL) 25 MG tablet Take 50 mg by mouth at bedtime.   [name] ibuprofen (ADVIL,MOTRIN) 800 MG tablet Take 800 mg by mouth every 8 (eight) hours as needed (pain).    [name] ipratropium-albuterol (DUONEB) 0.5-2.5 (3) MG/3ML SOLN Take 66m by nebulization every 4-6 hours if needed (Patient taking differently: Inhale 3 mLs into the lungs every 4 (four) hours as needed (wheezing/shortness of breath.). )   . levothyroxine (SYNTHROID, LEVOTHROID) 75 MCG tablet Take 75 mcg by mouth daily before breakfast.   . liraglutide (VICTOZA) 18 MG/3ML SOPN Inject 1.2 mg into the skin every evening.    . montelukast (SINGULAIR) 10 MG tablet Take 1 tablet (10 mg total) by mouth at bedtime.   . nebivolol (BYSTOLIC) 10 MG tablet Take 1 tablet (10 mg total) by mouth daily.   .Marland Kitchennystatin-triamcinolone (MYCOLOG II) cream Apply 1 application topically 2 (two) times daily as needed (yeast in skin folds).    .Marland Kitchenomeprazole (PRILOSEC) 40 MG capsule Take 1 capsule (40 mg total) by mouth 2 (two) times daily. MUST HAVE OFFICE VISIT FOR FURTHER REFILLS (Patient taking differently: Take 40 mg by mouth daily before breakfast. )   . pentosan polysulfate (ELMIRON) 100 MG capsule Take 100 mg by mouth 3 (three) times daily.   . SUMAtriptan (IMITREX) 20 MG/ACT nasal spray Place 20 mg into the nose every  2 (two) hours as needed for migraine or headache. May repeat in 2 hours if headache persists or recurs.   [name] tiZANidine (ZANAFLEX) 4 MG tablet Take 4 mg by mouth 3 (three) times daily. May hold one dose if needed to operate motor vehicle.    No facility-administered encounter medications on file as of 09/27/2019.    Functional Status:  In your present state of health, do you have any difficulty performing the following activities: 09/27/2019 06/28/2019  Hearing? N -  Vision? N -  Difficulty concentrating or making decisions? N -  Walking or  climbing stairs? N -  Dressing or bathing? N -  Doing errands, shopping? N Y  Conservation officer, nature and eating ? N -  Using the Toilet? N -  In the past six months, have you accidently leaked urine? N -  Do you have problems with loss of bowel control? N -  Managing your Medications? N -  Managing your Finances? N -  Housekeeping or managing your Housekeeping? N -  Some recent data might be hidden    Fall/Depression Screening: Fall Risk  09/27/2019 07/31/2017 07/03/2017  Falls in the past year? 0 No No  Comment - - -  Number falls in past yr: - - -  Injury with Fall? - - -  Risk Factor Category  - - -  Risk for fall due to : - - -  Follow up - - -   PHQ 2/9 Scores 09/27/2019 07/31/2017 07/03/2017 05/18/2017 03/24/2017 01/26/2017 12/29/2016  PHQ - 2 Score 1 1 1 1 1  0 0  PHQ- 9 Score - - - - - - -    Assessment: Patient will benefit from disease management services and support.    Plan:  [facility] CM Care Plan Problem One     Most Recent Value  Care Plan Problem One  knowledge deficit related to diabetes type 2  Role Documenting the Problem One  Care Management Telephonic Coordinator  Care Plan for Problem One  Active  [facility] Long Term Goal   Over the next 90 days, patient will demonstrate and/or verbalize understanding of self-health management for long term care of Diabetes.  [facility]N Long Term Goal Start Date  09/27/19  Interventions for Problem One Long Term Goal  RN CM discussed with patient diabetes control and importance.  Last A1c 7.2.     RN CM will provide ongoing education and support to patient through phone calls.   RN CM will send welcome packet with consent to patient.   RN CM will send initial barriers letter, assessment, and care plan to primary care physician.   RN CM will contact patient in the month of June and patient agrees to next contact.    [name], RN, MSN [facility] Care Management Coordinator Direct Line [phone] Cell [phone] Toll Free:  [phone]  Fax: [phone]

## 2019-09-29 DIAGNOSIS — Z794 Long term (current) use of insulin: Secondary | ICD-10-CM | POA: Diagnosis not present

## 2019-09-29 DIAGNOSIS — R5382 Chronic fatigue, unspecified: Secondary | ICD-10-CM | POA: Diagnosis not present

## 2019-09-29 DIAGNOSIS — F439 Reaction to severe stress, unspecified: Secondary | ICD-10-CM | POA: Diagnosis not present

## 2019-09-29 DIAGNOSIS — F418 Other specified anxiety disorders: Secondary | ICD-10-CM | POA: Diagnosis not present

## 2019-09-29 DIAGNOSIS — E039 Hypothyroidism, unspecified: Secondary | ICD-10-CM | POA: Diagnosis not present

## 2019-09-29 DIAGNOSIS — E1142 Type 2 diabetes mellitus with diabetic polyneuropathy: Secondary | ICD-10-CM | POA: Diagnosis not present

## 2019-09-29 DIAGNOSIS — I10 Essential (primary) hypertension: Secondary | ICD-10-CM | POA: Diagnosis not present

## 2019-09-29 DIAGNOSIS — G894 Chronic pain syndrome: Secondary | ICD-10-CM | POA: Diagnosis not present

## 2019-09-29 DIAGNOSIS — E782 Mixed hyperlipidemia: Secondary | ICD-10-CM | POA: Diagnosis not present

## 2019-09-29 DIAGNOSIS — M797 Fibromyalgia: Secondary | ICD-10-CM | POA: Diagnosis not present

## 2019-09-29 DIAGNOSIS — L304 Erythema intertrigo: Secondary | ICD-10-CM | POA: Diagnosis not present

## 2019-09-29 DIAGNOSIS — J45909 Unspecified asthma, uncomplicated: Secondary | ICD-10-CM | POA: Diagnosis not present

## 2019-10-06 DIAGNOSIS — N3010 Interstitial cystitis (chronic) without hematuria: Secondary | ICD-10-CM | POA: Diagnosis not present

## 2019-10-06 DIAGNOSIS — J449 Chronic obstructive pulmonary disease, unspecified: Secondary | ICD-10-CM | POA: Diagnosis not present

## 2019-10-07 ENCOUNTER — Ambulatory Visit: Payer: Medicare HMO | Admitting: Physician Assistant

## 2019-10-11 ENCOUNTER — Encounter: Payer: Self-pay | Admitting: Gastroenterology

## 2019-10-11 ENCOUNTER — Telehealth: Payer: Self-pay | Admitting: Gastroenterology

## 2019-10-11 ENCOUNTER — Ambulatory Visit: Payer: Medicare HMO | Admitting: Gastroenterology

## 2019-10-11 VITALS — BP 140/92 | HR 88 | Temp 98.3°F | Ht 62.0 in | Wt 188.0 lb

## 2019-10-11 DIAGNOSIS — R140 Abdominal distension (gaseous): Secondary | ICD-10-CM | POA: Insufficient documentation

## 2019-10-11 DIAGNOSIS — R142 Eructation: Secondary | ICD-10-CM | POA: Diagnosis not present

## 2019-10-11 DIAGNOSIS — R634 Abnormal weight loss: Secondary | ICD-10-CM | POA: Diagnosis not present

## 2019-10-11 DIAGNOSIS — R6881 Early satiety: Secondary | ICD-10-CM

## 2019-10-11 DIAGNOSIS — R110 Nausea: Secondary | ICD-10-CM

## 2019-10-11 DIAGNOSIS — R1013 Epigastric pain: Secondary | ICD-10-CM

## 2019-10-11 MED ORDER — PANTOPRAZOLE SODIUM 40 MG PO TBEC: 40.0000 mg | DELAYED_RELEASE_TABLET | Freq: Two times a day (BID) | ORAL | 4 refills | Status: DC

## 2019-10-11 MED ORDER — ONDANSETRON 4 MG PO TBDP: 4.0000 mg | ORAL_TABLET | Freq: Four times a day (QID) | ORAL | 1 refills | Status: DC | PRN

## 2019-10-11 MED ORDER — PREDNISONE 50 MG PO TABS: ORAL_TABLET | ORAL | 0 refills | Status: DC

## 2019-10-11 NOTE — Progress Notes (Signed)
10/11/2019 Cheryl [name] [id number] 01/15/1966   HISTORY OF PRESENT ILLNESS: This is a 54 year old female who is a patient of Dr. Lynne Leader.  She had colonoscopy in June 2019 at which time she was only found to have internal hemorrhoids.  Repeat recommended in 10 years.  Her EGD was in October 2019 at which time she had a large amount of retained food in her stomach.  GES was then normal, however.  She is diabetic, but most recent hemoglobin A1c was 7.1.  Recent TSH and CMP were unremarkable.  She is here today with complaints of constant nausea.  She says that she does not eat a lot because even when she eats a small amount she feels terrible.  She has a lot of belching and it has a very foul smell like rotten eggs.  She complains of discomfort and bloating/swelling in her upper abdomen.  Has occasional vomiting.  She has lost 30 pounds since she was last seen here in September 2019.  She tells me that she is also had a cough since September that pulmonary has been evaluating.  She takes omeprazole 40 mg daily.  She says it was previously prescribed as twice a day, but for some reason they have only been giving her enough for once daily dosing.  She recalls taking Reglan in the past, but says it has been a while and is unsure of the dosing that she took previously.  She says that she has been on omeprazole for quite some time.     Past Medical History:  Diagnosis Date  . Allergy   . Anxiety   . Asthma   . Cancer ([place])    melanoma 2015 upper Left arm   . Chronic airway obstruction, not elsewhere classified   . Chronic pain    "q where; herniated disc in my tailbone" (02/09/2013)  . DDD (degenerative disc disease)    CERVIAL AND LUMBAR  . Depression   . Edema   . Fatty liver disease, nonalcoholic   . Fibromyalgia    "severe" (02/09/2013)  . Gastroparesis    from DM and chronic narcotic use  . GERD (gastroesophageal reflux disease)   . Human parvovirus infection   . Hyperlipidemia     . Hypertension   . Hypothyroidism   . Interstitial cystitis   . Migraine headache    "weekly; worse lately" (02/09/2013)  . OSA (obstructive sleep apnea)    "have mask;; don't use it; no one came out to check it" (02/09/2013)  . Osteoporosis   . Peripheral neuropathy    "severe" (02/09/2013)  . 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 PONV (postoperative nausea and vomiting)   . Positive PPD   . Shortness of breath    "at anytime; it's gotten worse recently" (02/09/2013)  . Sleep apnea   . Type II diabetes mellitus ([place])   . Vitamin D deficiency   . Vocal cord dysfunction    Past Surgical History:  Procedure Laterality Date  . ABDOMINAL HYSTERECTOMY  1993  . ANTERIOR CERVICAL DECOMP/DISCECTOMY FUSION  2004  . arm surgery     left 2018  . BRONCHOSCOPY  06/28/2019  . CARPAL TUNNEL RELEASE Bilateral ?1980's  . CHOLECYSTECTOMY  ?1990  . COLONOSCOPY    . HIP SURGERY Right 2002   "did something to the tibula band" (02/09/2013)  . KNEE ARTHROSCOPY Left 1990's   "fell; clot behind knee cap had to  be removed" (02/09/2013)  . NASAL SINUS SURGERY  1986; 1990's   "i've had 3" (02/09/2013)  . RIGHT/LEFT HEART CATH AND CORONARY ANGIOGRAPHY N/A 08/26/2017   Procedure: RIGHT/LEFT HEART CATH AND CORONARY ANGIOGRAPHY;  Surgeon: Belva Crome, MD;  Location: [facility];  Service: Cardiovascular;  Laterality: N/A;  . SHOULDER ARTHROSCOPY W/ ROTATOR CUFF REPAIR Right    "had to go deep in rotator cuff" (02/09/2013)  . VIDEO BRONCHOSCOPY Bilateral 06/28/2019   Procedure: VIDEO BRONCHOSCOPY WITH FLUORO;  Surgeon: Marshell Garfinkel, MD;  Location: [facility];  Service: Cardiopulmonary;  Laterality: Bilateral;    reports that she quit smoking about 11 years ago. Her smoking use included cigarettes. She has a 0.30 pack-year smoking history. She has never used smokeless tobacco. She reports that she does not drink alcohol or use drugs. family history  includes Breast cancer in her cousin, cousin, and maternal aunt; Coronary artery disease in her father; Diabetes in her brother and brother. Allergies  Allergen Reactions  . Influenza Vaccines Anaphylaxis and Swelling    Throat swelling, "flu" symptoms  . Iodinated Diagnostic Agents Shortness Of Breath, Nausea And Vomiting, Nausea Only and Rash  . Iodine-131 Shortness Of Breath, Nausea And Vomiting and Rash  . Iohexol Anaphylaxis, Hives and Swelling     Desc: hives,dyspnea; throat swelling; ok w/ premeds and omnipaque   . Levofloxacin Anaphylaxis and Hives  . Nucynta [Tapentadol Hydrochloride] Other (See Comments)    Hallucinations and  insomnia x3 days  . Pregabalin Other (See Comments)    (lyrica)Reaction-Syncope & unable to speak  . Sulfonamide Derivatives Anaphylaxis  . Vantin [Cefpodoxime] Anaphylaxis, Hives, Rash and Cough  . Vilazodone Hcl Other (See Comments)    (Viibryd) Hallucinations  . Amoxicillin Hives and Itching    Did it involve swelling of the face/tongue/throat, SOB, or low BP? No Did it involve sudden or severe rash/hives, skin peeling, or any reaction on the inside of your mouth or nose? Yes Did you need to seek medical attention at a hospital or doctor's office? No When did it last happen?3-4 years ago If all above answers are "NO", may proceed with cephalosporin use. .  . Biaxin [Clarithromycin] Hives  . Carbamazepine Itching    (Tegretol)  . Ciprofloxacin Hives  . Dilaudid [Hydromorphone Hcl] Itching and Rash    redness  . Iodides Nausea Only  . Percocet [Oxycodone-Acetaminophen] Rash      Outpatient Encounter Medications as of 10/11/2019  Medication Sig  . albuterol (VENTOLIN HFA) 108 (90 Base) MCG/ACT inhaler INHALE 2 PUFFS EVERY 6 HOURS AS NEEDED FOR WHEEZING OR SHORTNESS OF BREATH.  [name] atorvastatin (LIPITOR) 20 MG tablet Take 20 mg by mouth daily.  . Cholecalciferol (VITAMIN D3) 1000 units CAPS Take 1,000 Units by mouth daily.  . diazepam (VALIUM) 10  MG tablet Take 10 mg by mouth 2 (two) times daily as needed (panic attacks.).   [name] diltiazem (CARDIZEM CD) 240 MG 24 hr capsule TAKE 1 CAPSULE BY MOUTH EVERY MORNING ON AN EMPTY STOMACH - [name] DENIED, FAXED DR Johnsie Cancel 10/22/15 SS (Patient taking differently: Take 240 mg by mouth daily. )  . FLUoxetine (PROZAC) 40 MG capsule Take 80 mg by mouth at bedtime.   . fluticasone furoate-vilanterol (BREO ELLIPTA) 200-25 MCG/INH AEPB Inhale 1 puff into the lungs daily.  . Fluticasone-Umeclidin-Vilant (TRELEGY ELLIPTA) 100-62.5-25 MCG/INH AEPB Inhale 1 puff into the lungs daily.  [name] gabapentin (NEURONTIN) 300 MG capsule Take 600 mg by mouth at bedtime.   [name]  HUMULIN R U-500 KWIKPEN 500 UNIT/ML kwikpen Inject 100 Units into the skin daily.   . hydrochlorothiazide (HYDRODIURIL) 25 MG tablet Take 25 mg by mouth daily.  . hydrOXYzine (ATARAX/VISTARIL) 25 MG tablet Take 50 mg by mouth at bedtime.  [name] ibuprofen (ADVIL,MOTRIN) 800 MG tablet Take 800 mg by mouth every 8 (eight) hours as needed (pain).   [name] ipratropium-albuterol (DUONEB) 0.5-2.5 (3) MG/3ML SOLN Take 74m by nebulization every 4-6 hours if needed (Patient taking differently: Inhale 3 mLs into the lungs every 4 (four) hours as needed (wheezing/shortness of breath.). )  . levothyroxine (SYNTHROID, LEVOTHROID) 75 MCG tablet Take 75 mcg by mouth daily before breakfast.  . liraglutide (VICTOZA) 18 MG/3ML SOPN Inject 1.2 mg into the skin every evening.   . nebivolol (BYSTOLIC) 10 MG tablet Take 1 tablet (10 mg total) by mouth daily.  .Marland Kitchennystatin-triamcinolone (MYCOLOG II) cream Apply 1 application topically 2 (two) times daily as needed (yeast in skin folds).   .Marland Kitchenomeprazole (PRILOSEC) 40 MG capsule Take 1 capsule (40 mg total) by mouth 2 (two) times daily. MUST HAVE OFFICE VISIT FOR FURTHER REFILLS (Patient taking differently: Take 40 mg by mouth daily before breakfast. )  . pentosan polysulfate (ELMIRON) 100 MG capsule Take 100 mg by mouth 3 (three) times daily.    . SUMAtriptan (IMITREX) 20 MG/ACT nasal spray Place 20 mg into the nose every 2 (two) hours as needed for migraine or headache. May repeat in 2 hours if headache persists or recurs.  .Marland KitchentiZANidine (ZANAFLEX) 4 MG tablet Take 4 mg by mouth 3 (three) times daily. May hold one dose if needed to operate motor vehicle.  . [DISCONTINUED] acetaminophen (TYLENOL) 500 MG tablet Take 1 tablet (500 mg total) by mouth every 8 (eight) hours as needed for moderate pain.  . [DISCONTINUED] montelukast (SINGULAIR) 10 MG tablet Take 1 tablet (10 mg total) by mouth at bedtime.   No facility-administered encounter medications on file as of 10/11/2019.     REVIEW OF SYSTEMS  : All other systems reviewed and negative except where noted in the History of Present Illness.   PHYSICAL EXAM: BP (!) 140/92   Pulse 88   Temp 98.3 F (36.8 C)   Ht 5' 2"  (1.575 m)   Wt 188 lb (85.3 kg)   SpO2 97%   BMI 34.39 kg/m  General: Well developed white female in no acute distress Head: Normocephalic and atraumatic Eyes:  Sclerae anicteric, conjunctiva pink. Ears: Normal auditory acuity Lungs: Clear throughout to auscultation; no increased WOB. Heart: Regular rate and rhythm; no M/R/G. Abdomen: Soft, non-distended.  BS present.  Diffuse TTP. Musculoskeletal: Symmetrical with no gross deformities  Skin: No lesions on visible extremities Extremities: No edema  Neurological: Alert oriented x 4, grossly non-focal Psychological:  Alert and cooperative. Normal mood and affect  ASSESSMENT AND PLAN: *54year old female with complaints of constant nausea, early satiety, foul-smelling belching, upper abdominal bloating, GERD, and upper abdominal pain.  The symptoms have been ongoing, but worsening over time.  Has lost about 30 pounds since he was last seen here in September 2019.  Previously had retained food in her stomach on EGD, but GES was negative.  Is diabetic but hemoglobin A1c most recently was 7.1.  Clinically I think  that she likely has gastroparesis.  She thinks she has tried Reglan in the past, but not sure of dosing.  She has not had any type of cross-sectional imaging of her abdomen in quite some time.  I  think that with the associated weight loss we will start by doing a CT scan of the abdomen/pelvis with contrast.  She does have contrast allergy but has done well with premedication in the past.  We will plan for that.  Otherwise I am going to switch her PPI to pantoprazole as she has been on the omeprazole for quite some time.  I am also going to change it to 40 mg twice daily.  Will give Zofran for nausea for now as well.  Both of those prescriptions were sent.  Pending results of the CT scan and response to medication change may need to consider trial of Reglan 10 mg before meals and at bedtime.   CC:  Aurea Graff, PA-C

## 2019-10-11 NOTE — Telephone Encounter (Signed)
Spoke with pharmacy ad explained dosage for Prednisone.

## 2019-10-11 NOTE — Patient Instructions (Addendum)
If you are age 53 or older, your body mass index should be between 23-30. Your Body mass index is 34.39 kg/m. If this is out of the aforementioned range listed, please consider follow up with your Primary Care Provider.  If you are age 55 or younger, your body mass index should be between 19-25. Your Body mass index is 34.39 kg/m. If this is out of the aformentioned range listed, please consider follow up with your Primary Care Provider.   We have sent the following medications to your pharmacy for you to pick up at your convenience: Pantoprazole 40 mg twice daily.  Zofran 4 mg every 6 hours as needed.   You have been scheduled for a CT scan of the abdomen and pelvis at [facility] (1st floor radiology) You are scheduled on Friday 10/21/19 at 2:30 pm. You should arrive 15 minutes prior to your appointment time for registration. Please follow the written instructions below on the day of your exam:  Since you are allergic to one or more components in IV contrast, we have sent a prescription for (3) 50 mg tablets of Prednisone to your pharmacy as a Pre-Medication Prep for your upcoming procedure requiring contrast.    Take (1) 50 mg tablet of prednisone 13 hours prior to your procedure at 1:30 am.   Take (1) 50 mg tablet 7 hours prior to your procedure at 7:30 am.    Take (1) 50 mg tablet 1 hour prior to your procedure at 1:30 pm.    You also need to take 50 mg of Benadryl 1 hour prior to your procedure at 1:30 pm.  If you have 25 mg tablets of Benadryl, which can be purchased over the counter, you may take 2 tablets.  Otherwise we can send a prescription for (1) 50 mg tablet of Benadryl to your pharmacy.     1) Do not eat or drink anything after 10:30 am(4 hours prior to your test) 2) You have been given 2 bottles of oral contrast to drink. The solution may taste better if refrigerated, but do NOT add ice or any other liquid to this solution. Shake well before drinking.    Drink 1  bottle of contrast @ 12:30 pm (2 hours prior to your exam)  Drink 1 bottle of contrast @ 1:30 pm (1 hour prior to your exam)  You may take any medications as prescribed with a small amount of water, if necessary. If you take any of the following medications: METFORMIN, GLUCOPHAGE, GLUCOVANCE, AVANDAMET, RIOMET, FORTAMET, [place] MET, JANUMET, GLUMETZA or METAGLIP, you MAY be asked to HOLD this medication 48 hours AFTER the exam.  The purpose of you drinking the oral contrast is to aid in the visualization of your intestinal tract. The contrast solution may cause some diarrhea. Depending on your individual set of symptoms, you may also receive an intravenous injection of x-ray contrast/dye.   This test typically takes 30-45 minutes to complete.  _____________________________________________________________  Thank you for entrusting me with your care and for choosing [employer], [place], [place]

## 2019-10-11 NOTE — Addendum Note (Signed)
Addended by: Horris Latino on: 10/11/2019 04:32 PM   Modules accepted: Orders

## 2019-10-11 NOTE — Progress Notes (Signed)
Reviewed and agree with management plan.  Hadeel Hillebrand T. Emir Nack, MD FACG [facility]  

## 2019-10-13 ENCOUNTER — Telehealth: Payer: Self-pay | Admitting: Gastroenterology

## 2019-10-13 DIAGNOSIS — R1013 Epigastric pain: Secondary | ICD-10-CM

## 2019-10-13 DIAGNOSIS — R05 Cough: Secondary | ICD-10-CM | POA: Diagnosis not present

## 2019-10-13 DIAGNOSIS — R110 Nausea: Secondary | ICD-10-CM

## 2019-10-13 DIAGNOSIS — R142 Eructation: Secondary | ICD-10-CM

## 2019-10-13 DIAGNOSIS — R140 Abdominal distension (gaseous): Secondary | ICD-10-CM

## 2019-10-13 NOTE — Telephone Encounter (Signed)
Pt is scheduled for CT 10/21/19.  Vanda from [facility] stated that pt has a dye allergy and will need the 13-h prep.  She also requested for I-STAT crea orders to be placed for pt to have labs done prior to CT.

## 2019-10-19 NOTE — Telephone Encounter (Signed)
Patient was given instructions for the 13 hr prep before CT.

## 2019-10-20 NOTE — Telephone Encounter (Signed)
Order for STAT creatine was placed.

## 2019-10-20 NOTE — Telephone Encounter (Signed)
Tedra Coupe called this morning requesting order for I-Stat creat

## 2019-10-21 ENCOUNTER — Other Ambulatory Visit: Payer: Self-pay

## 2019-10-21 ENCOUNTER — Telehealth: Payer: Self-pay | Admitting: Gastroenterology

## 2019-10-21 ENCOUNTER — Ambulatory Visit (HOSPITAL_COMMUNITY)
Admission: RE | Admit: 2019-10-21 | Discharge: 2019-10-21 | Disposition: A | Discharge status: Home / Self Care | Payer: Medicare HMO | Source: Ambulatory Visit | Attending: Gastroenterology | Admitting: Gastroenterology

## 2019-10-21 ENCOUNTER — Encounter (HOSPITAL_COMMUNITY): Payer: Self-pay

## 2019-10-21 DIAGNOSIS — R140 Abdominal distension (gaseous): Secondary | ICD-10-CM

## 2019-10-21 DIAGNOSIS — R6881 Early satiety: Secondary | ICD-10-CM

## 2019-10-21 DIAGNOSIS — R634 Abnormal weight loss: Secondary | ICD-10-CM | POA: Diagnosis not present

## 2019-10-21 DIAGNOSIS — R1013 Epigastric pain: Secondary | ICD-10-CM

## 2019-10-21 DIAGNOSIS — R110 Nausea: Secondary | ICD-10-CM | POA: Insufficient documentation

## 2019-10-21 DIAGNOSIS — R142 Eructation: Secondary | ICD-10-CM

## 2019-10-21 DIAGNOSIS — R1031 Right lower quadrant pain: Secondary | ICD-10-CM | POA: Diagnosis not present

## 2019-10-21 LAB — POCT I-STAT CREATININE: Creatinine, Ser: 0.6 mg/dL (ref 0.44–1.00)

## 2019-10-21 MED ORDER — SODIUM CHLORIDE (PF) 0.9 % IJ SOLN
INTRAMUSCULAR | Status: AC
  Filled 2019-10-21: qty 50

## 2019-10-21 MED ORDER — IOHEXOL 300 MG/ML  SOLN
100.0000 mL | Freq: Once | INTRAMUSCULAR | Status: AC | PRN
  Administered 2019-10-21: 100 mL via INTRAVENOUS

## 2019-10-21 NOTE — Telephone Encounter (Signed)
Received a call from [place] at [phone] stating that the pt is schedule for a CT and a creatinine was ordered by Nira Conn but the order has to be a ISTAT Creatinine. Nira Conn is out today so I placed an order for ISTAT Creatinine.

## 2019-10-24 ENCOUNTER — Ambulatory Visit: Payer: Medicare HMO | Admitting: Internal Medicine

## 2019-10-24 ENCOUNTER — Ambulatory Visit (INDEPENDENT_AMBULATORY_CARE_PROVIDER_SITE_OTHER): Payer: Medicare HMO

## 2019-10-24 ENCOUNTER — Encounter: Payer: Self-pay | Admitting: Internal Medicine

## 2019-10-24 ENCOUNTER — Other Ambulatory Visit: Payer: Self-pay

## 2019-10-24 VITALS — BP 142/90 | HR 95 | Temp 97.2°F | Ht 62.0 in | Wt 184.0 lb

## 2019-10-24 DIAGNOSIS — J45909 Unspecified asthma, uncomplicated: Secondary | ICD-10-CM | POA: Diagnosis not present

## 2019-10-24 DIAGNOSIS — J849 Interstitial pulmonary disease, unspecified: Secondary | ICD-10-CM | POA: Diagnosis not present

## 2019-10-24 DIAGNOSIS — J328 Other chronic sinusitis: Secondary | ICD-10-CM | POA: Diagnosis not present

## 2019-10-24 DIAGNOSIS — J40 Bronchitis, not specified as acute or chronic: Secondary | ICD-10-CM | POA: Diagnosis not present

## 2019-10-24 MED ORDER — METHYLPREDNISOLONE ACETATE 80 MG/ML IJ SUSP
80.0000 mg | Freq: Once | INTRAMUSCULAR | Status: AC
  Administered 2019-10-24: 80 mg via INTRAMUSCULAR

## 2019-10-24 NOTE — Progress Notes (Signed)
Subjective:    Patient ID: Cheryl [name], female    DOB: 02/28/1966, 54 y.o.   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 PFT 07/18/15-WNL-FVC 3.03/88%, FEV1 2.63/95%, ratio 0.87, TLC 105%, DLCO 113% Echocardiogram 02/12/15-EF 60-65 percent, grade 1 diastolic dysfunction Video bronchoscopy 06/28/2019- for evaluation of ground glass and tiny nodules, with concern of possible eosinophilic pneumonia, nonspecific with histiocytes noted- no malignancy or eosinophils. 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 Complicated by R PTX She complains of chronic cough at least since September. Neb helps. Not using Singulair- says "had problems with it before".  Denies rash or adenopathy.   Admits reflux awareness. Not using CPAP currently. Sleeps propped up. Has gastropoesis, working with GI. Burps up "rotten eggs". Lab 06/30/2019- Hypersensitivity pneumonia-Neg, ESR 4, ILD panel- NEG, IgE 373 H,  EOS 15.4 H 06/06/2019 CT chest 06/07/2019-  IMPRESSION: 1. Mild central bronchial wall  thickening, can be seen with bronchitis or reactive airways disease. 2. Small subpleural ground-glass opacities in the upper lobes is nonspecific. Findings may be post infectious or inflammatory. Alternatively, interstitial lung disease or chronic eosinophilic pneumonia could have a similar appearance. This could be reassessed at time of nodule follow-up, see below. 3. Tiny (less than 4 mm) right and left upper lobe pulmonary nodules, not definitively seen on prior exam. No follow-up needed if patient is low-risk. Non-contrast chest CT can be considered in 12 months if patient is high-risk. This recommendation follows the consensus statement: Guidelines for Management of Incidental Pulmonary Nodules Detected on CT Images: From the Fleischner Society 2017; Radiology 2017; 284:228-243. 4. Coronary artery calcifications. 5. Mild splenomegaly in the upper abdomen is partially included.  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 Patient stated she has been wheezing for a few weeks Blames pollen, and rainy/ moldy weather and her mobile home. Just finished Zpak last week w/o effect. No fever. Yellow phlegm.  Mask "aggravating". Says tessalon and prometh codeine don't help.    Review of Systems- see HPI   + = positive Constitutional:   No-   weight loss, night sweats, fevers, chills,+irregular sleep schedule, +fatigue, lassitude. HEENT:   No-headaches, difficulty swallowing, tooth/dental problems, +sore throat,       No-  sneezing, itching, +ear ache, nasal congestion, post nasal drip,  CV:  chest pain, orthopnea, PND, swelling in lower  extremities, anasarca,  dizziness, +palpitations  Resp: + shortness of breath with exertion or at rest.              No- productive cough,  + non-productive cough,  No- coughing up of blood.              No- change in color of mucus.   Skin: No-   rash or lesions. GI:  No-   heartburn, indigestion, abdominal pain, nausea, vomiting,  GU:  MS:  No-   joint pain or swelling.   Neuro-    +tremor Psych:  No- change in mood or affect. + depression or anxiety.  No memory loss.  Objective:   Physical Exam      OBJ- Physical Exam General- Alert, Oriented,  Distress- none acute. + overweight Skin- rash-none, lesions- none, excoriation- none Lymphadenopathy- none Head- atraumatic            Eyes- Gross vision intact, PERRLA, conjunctivae and secretions clear            Ears- Hearing, canals-normal            Nose-  turbinate edema-none, no-Septal dev, mucus, polyps, erosion, perforation             Throat- Mallampati II , mucosa clear , drainage- none, tonsils- atrophic,  Neck- flexible , trachea midline, no stridor , thyroid nl, carotid no bruit Chest - symmetrical excursion , unlabored           Heart/CV- RRR , no murmur , no gallop  , no rub, nl s1 s2                           - JVD- none , edema- none, stasis changes- none, varices- none           Lung-  Wheeze-none, cough+ dry , dullness-none, rub- none           Chest wall-  Abd-  Br/ Gen/ Rectal- Not done, not indicated Extrem- cyanosis- none, clubbing, none, atrophy- none, strength- nl Neuro- +tremor

## 2019-10-24 NOTE — Patient Instructions (Addendum)
Order- CXR  Dx  Asthmatic bronchitis exacerbation  Order- depo 80     Dx asthmatic bronchitis exacerbation  Please call if we can help

## 2019-10-25 NOTE — Progress Notes (Signed)
Patient identification verified. Recent x ray results reviewed. Per Dr. [name], heart looks normal and lungs are clear. Patient verbalized understanding of results.

## 2019-11-02 ENCOUNTER — Encounter: Payer: Self-pay | Admitting: Internal Medicine

## 2019-11-02 DIAGNOSIS — E039 Hypothyroidism, unspecified: Secondary | ICD-10-CM | POA: Diagnosis not present

## 2019-11-02 DIAGNOSIS — Z794 Long term (current) use of insulin: Secondary | ICD-10-CM | POA: Diagnosis not present

## 2019-11-02 DIAGNOSIS — E1142 Type 2 diabetes mellitus with diabetic polyneuropathy: Secondary | ICD-10-CM | POA: Diagnosis not present

## 2019-11-02 DIAGNOSIS — E1165 Type 2 diabetes mellitus with hyperglycemia: Secondary | ICD-10-CM | POA: Diagnosis not present

## 2019-11-02 DIAGNOSIS — E782 Mixed hyperlipidemia: Secondary | ICD-10-CM | POA: Diagnosis not present

## 2019-11-02 NOTE — Assessment & Plan Note (Signed)
Very small nodules and ground glass opacities on CT in November. Plan- interval CXR. Consider CT in Nov, 2021

## 2019-11-02 NOTE — Assessment & Plan Note (Addendum)
Elevated allergy markers, consistent with her concerns about pollen and mold exposure. Not describing significant asthma control problems to need Biologic now, but that may change.  Plan- emphasize environmental precautions, esp in her home- dust and mold control measures. Consider dehumidifier.

## 2019-11-02 NOTE — Assessment & Plan Note (Signed)
Mild seasonal exacerbation Plan- CXR, depo 80

## 2019-11-07 ENCOUNTER — Other Ambulatory Visit: Payer: Self-pay | Admitting: Family Medicine

## 2019-11-07 DIAGNOSIS — R935 Abnormal findings on diagnostic imaging of other abdominal regions, including retroperitoneum: Secondary | ICD-10-CM | POA: Diagnosis not present

## 2019-11-07 DIAGNOSIS — K769 Liver disease, unspecified: Secondary | ICD-10-CM | POA: Diagnosis not present

## 2019-11-07 DIAGNOSIS — Z1231 Encounter for screening mammogram for malignant neoplasm of breast: Secondary | ICD-10-CM

## 2019-11-14 DIAGNOSIS — Z20828 Contact with and (suspected) exposure to other viral communicable diseases: Secondary | ICD-10-CM | POA: Diagnosis not present

## 2019-11-14 DIAGNOSIS — Z20822 Contact with and (suspected) exposure to covid-19: Secondary | ICD-10-CM | POA: Diagnosis not present

## 2019-11-16 DIAGNOSIS — Z85820 Personal history of malignant melanoma of skin: Secondary | ICD-10-CM | POA: Diagnosis not present

## 2019-11-16 DIAGNOSIS — D225 Melanocytic nevi of trunk: Secondary | ICD-10-CM | POA: Diagnosis not present

## 2019-11-16 DIAGNOSIS — D2272 Melanocytic nevi of left lower limb, including hip: Secondary | ICD-10-CM | POA: Diagnosis not present

## 2019-11-16 DIAGNOSIS — D1801 Hemangioma of skin and subcutaneous tissue: Secondary | ICD-10-CM | POA: Diagnosis not present

## 2019-11-16 DIAGNOSIS — L821 Other seborrheic keratosis: Secondary | ICD-10-CM | POA: Diagnosis not present

## 2019-11-16 DIAGNOSIS — L814 Other melanin hyperpigmentation: Secondary | ICD-10-CM | POA: Diagnosis not present

## 2019-11-18 DIAGNOSIS — Z794 Long term (current) use of insulin: Secondary | ICD-10-CM | POA: Diagnosis not present

## 2019-11-18 DIAGNOSIS — E1142 Type 2 diabetes mellitus with diabetic polyneuropathy: Secondary | ICD-10-CM | POA: Diagnosis not present

## 2019-11-21 DIAGNOSIS — M797 Fibromyalgia: Secondary | ICD-10-CM | POA: Diagnosis not present

## 2019-11-21 DIAGNOSIS — N135 Crossing vessel and stricture of ureter without hydronephrosis: Secondary | ICD-10-CM | POA: Diagnosis not present

## 2019-11-21 DIAGNOSIS — R3915 Urgency of urination: Secondary | ICD-10-CM | POA: Diagnosis not present

## 2019-11-21 DIAGNOSIS — R350 Frequency of micturition: Secondary | ICD-10-CM | POA: Diagnosis not present

## 2019-11-21 DIAGNOSIS — E1165 Type 2 diabetes mellitus with hyperglycemia: Secondary | ICD-10-CM | POA: Diagnosis not present

## 2019-11-21 DIAGNOSIS — N3010 Interstitial cystitis (chronic) without hematuria: Secondary | ICD-10-CM | POA: Diagnosis not present

## 2019-11-21 DIAGNOSIS — K219 Gastro-esophageal reflux disease without esophagitis: Secondary | ICD-10-CM | POA: Diagnosis not present

## 2019-11-21 DIAGNOSIS — I10 Essential (primary) hypertension: Secondary | ICD-10-CM | POA: Diagnosis not present

## 2019-11-21 DIAGNOSIS — M7918 Myalgia, other site: Secondary | ICD-10-CM | POA: Diagnosis not present

## 2019-11-30 ENCOUNTER — Telehealth: Payer: Self-pay | Admitting: Gastroenterology

## 2019-11-30 NOTE — Telephone Encounter (Signed)
Can try Reglan 5 mg before meals and at bedtime.  Send prescription for 1 month supply with no refills.  And also make her a follow-up appointment to come back and see [name] Plan at his next available.  Please advise her on side effects of Reglan such as muscle twitching, tics, tremors.  These are possible side effects and may not occur, but if they do then she needs to stop the medication immediately and contact our office immediately.  Thank you,  Jess

## 2019-12-01 MED ORDER — METOCLOPRAMIDE HCL 5 MG PO TABS: 5.0000 mg | ORAL_TABLET | Freq: Four times a day (QID) | ORAL | 0 refills | Status: DC

## 2019-12-01 NOTE — Telephone Encounter (Signed)
Informed patient of prescription for Reglan and possible side effects. Sent medication to [facility]. Scheduled appointment with [name] Plan on 12/12/19 @ 9:50 am

## 2019-12-02 DIAGNOSIS — Z794 Long term (current) use of insulin: Secondary | ICD-10-CM | POA: Diagnosis not present

## 2019-12-02 DIAGNOSIS — E11649 Type 2 diabetes mellitus with hypoglycemia without coma: Secondary | ICD-10-CM | POA: Diagnosis not present

## 2019-12-02 DIAGNOSIS — Z8719 Personal history of other diseases of the digestive system: Secondary | ICD-10-CM | POA: Diagnosis not present

## 2019-12-02 DIAGNOSIS — K760 Fatty (change of) liver, not elsewhere classified: Secondary | ICD-10-CM | POA: Diagnosis not present

## 2019-12-02 DIAGNOSIS — K7400 Hepatic fibrosis, unspecified: Secondary | ICD-10-CM | POA: Diagnosis not present

## 2019-12-02 DIAGNOSIS — I10 Essential (primary) hypertension: Secondary | ICD-10-CM | POA: Diagnosis not present

## 2019-12-02 DIAGNOSIS — K7581 Nonalcoholic steatohepatitis (NASH): Secondary | ICD-10-CM | POA: Diagnosis not present

## 2019-12-03 ENCOUNTER — Other Ambulatory Visit: Payer: Self-pay | Admitting: Internal Medicine

## 2019-12-09 DIAGNOSIS — G894 Chronic pain syndrome: Secondary | ICD-10-CM | POA: Diagnosis not present

## 2019-12-12 ENCOUNTER — Ambulatory Visit: Payer: Self-pay | Admitting: Gastroenterology

## 2019-12-13 DIAGNOSIS — N3010 Interstitial cystitis (chronic) without hematuria: Secondary | ICD-10-CM | POA: Diagnosis not present

## 2019-12-13 DIAGNOSIS — Z466 Encounter for fitting and adjustment of urinary device: Secondary | ICD-10-CM | POA: Diagnosis not present

## 2019-12-15 DIAGNOSIS — G8929 Other chronic pain: Secondary | ICD-10-CM | POA: Diagnosis not present

## 2019-12-15 DIAGNOSIS — R109 Unspecified abdominal pain: Secondary | ICD-10-CM | POA: Diagnosis not present

## 2019-12-16 ENCOUNTER — Telehealth: Payer: Self-pay | Admitting: Gastroenterology

## 2019-12-16 NOTE — Telephone Encounter (Signed)
Patient called states she is having a lot of nausea she thinks its from the medication. A;so stated she had liver work up and is needing a plan of action for that.

## 2019-12-16 NOTE — Telephone Encounter (Signed)
Pt states the urologist told her she should follow up with [name] Plan regarding her liver. Pt states she saw a liver specialist Dawn Drazek but wanted [name] Plan to do a workup. Discussed with pt that she should complete the workup with Dawn as she can complete the workup. 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 Janett Billow and [name] Plan notified.

## 2019-12-16 NOTE — Telephone Encounter (Signed)
When I called her she just stated that with her gastroparesis she had nausea and the reglan had been helping but some days she still has the nausea and vomiting with the reglan. Main reason for call was liver concern.

## 2019-12-16 NOTE — Telephone Encounter (Signed)
Yes, it looks like Cheryl [name] just ordered a bunch of stuff and wants to see her back in 4 months.  She needs to proceed with all of that.  What medication did she think was causing nausea?  The only meds that I have prescribed were to help with nausea.Marland KitchenMarland Kitchen

## 2019-12-16 NOTE — Telephone Encounter (Signed)
Ok. Thank you.

## 2019-12-21 DIAGNOSIS — K760 Fatty (change of) liver, not elsewhere classified: Secondary | ICD-10-CM | POA: Diagnosis not present

## 2019-12-21 DIAGNOSIS — R7401 Elevation of levels of liver transaminase levels: Secondary | ICD-10-CM | POA: Diagnosis not present

## 2019-12-27 ENCOUNTER — Other Ambulatory Visit: Payer: Self-pay

## 2019-12-27 NOTE — Patient Outreach (Addendum)
[facility]) Care Management  12/27/2019  Cheryl [name] September 28, 1965 [id number]   Telephone call to patient for disease management follow up. No answer. HIPAA compliant voice message left.    Plan: RN CM will wait return call.  If no return call will attempt patient again in the month of September and send letter.    [name], RN, MSN [facility] Care Management Coordinator Direct Line [phone] Cell [phone] Toll Free: [phone]  Fax: [phone]

## 2020-01-24 DIAGNOSIS — E1165 Type 2 diabetes mellitus with hyperglycemia: Secondary | ICD-10-CM | POA: Diagnosis not present

## 2020-02-01 DIAGNOSIS — E1142 Type 2 diabetes mellitus with diabetic polyneuropathy: Secondary | ICD-10-CM | POA: Diagnosis not present

## 2020-02-01 DIAGNOSIS — E782 Mixed hyperlipidemia: Secondary | ICD-10-CM | POA: Diagnosis not present

## 2020-02-01 DIAGNOSIS — E1165 Type 2 diabetes mellitus with hyperglycemia: Secondary | ICD-10-CM | POA: Diagnosis not present

## 2020-02-01 DIAGNOSIS — E039 Hypothyroidism, unspecified: Secondary | ICD-10-CM | POA: Diagnosis not present

## 2020-02-01 DIAGNOSIS — Z794 Long term (current) use of insulin: Secondary | ICD-10-CM | POA: Diagnosis not present

## 2020-02-01 DIAGNOSIS — M545 Low back pain: Secondary | ICD-10-CM | POA: Diagnosis not present

## 2020-02-08 ENCOUNTER — Ambulatory Visit: Payer: Medicare HMO | Admitting: Nurse Practitioner

## 2020-02-15 ENCOUNTER — Other Ambulatory Visit: Payer: Self-pay | Admitting: Cardiovascular Disease

## 2020-02-23 ENCOUNTER — Ambulatory Visit: Payer: Medicare HMO | Admitting: Internal Medicine

## 2020-02-23 ENCOUNTER — Telehealth: Payer: Self-pay | Admitting: Cardiovascular Disease

## 2020-02-23 DIAGNOSIS — E1165 Type 2 diabetes mellitus with hyperglycemia: Secondary | ICD-10-CM | POA: Diagnosis not present

## 2020-02-23 NOTE — Telephone Encounter (Signed)
New Message  Pt c/o BP issue: STAT if pt c/o blurred vision, one-sided weakness or slurred speech  1. What are your last 5 BP readings? 181/103, 172/102, 173/104 Hr 87, 199/105 Hr 87, 170/103  2. Are you having any other symptoms (ex. Dizziness, headache, blurred vision, passed out)? Headache, blurred vision (left side), pvc's, dizziness,   3. What is your BP issue? BP elevated

## 2020-02-23 NOTE — Telephone Encounter (Signed)
Call was sent straight to triage. Patient complaining of H/A, blurred vision, high BP, PVCs and dizziness. Informed patient that blurred vision could be a sign of a stroke. Informed patient to call 911. Patient stated she would like an appointment to see Dr. Johnsie Cancel. Informed patient that his next appointment is in October and her issues need to be address sooner than later. Also, patient is suppose to follow up as needed, due to all testing including heart cath was normal. Patient does have HTN, which is followed by her PCP.   Encouraged patient to go to ED, urgent care or call 911 to rule out stroke. Patient stated she thinks her blurred vision is from her migraine. Patient really does not want to go to ED or urgent care. Encourage patient to follow up with her PCP ASAP. Patient verbalized understanding.

## 2020-02-24 DIAGNOSIS — I10 Essential (primary) hypertension: Secondary | ICD-10-CM | POA: Diagnosis not present

## 2020-02-24 DIAGNOSIS — K3184 Gastroparesis: Secondary | ICD-10-CM | POA: Diagnosis not present

## 2020-02-24 DIAGNOSIS — R002 Palpitations: Secondary | ICD-10-CM | POA: Diagnosis not present

## 2020-02-28 ENCOUNTER — Other Ambulatory Visit: Payer: Self-pay | Admitting: Family Medicine

## 2020-02-28 DIAGNOSIS — Z1231 Encounter for screening mammogram for malignant neoplasm of breast: Secondary | ICD-10-CM

## 2020-03-01 ENCOUNTER — Other Ambulatory Visit: Payer: Self-pay

## 2020-03-01 ENCOUNTER — Ambulatory Visit
Admission: RE | Admit: 2020-03-01 | Discharge: 2020-03-01 | Disposition: A | Discharge status: Home / Self Care | Payer: Medicare HMO | Source: Ambulatory Visit | Attending: Family Medicine | Admitting: Family Medicine

## 2020-03-01 DIAGNOSIS — Z1231 Encounter for screening mammogram for malignant neoplasm of breast: Secondary | ICD-10-CM | POA: Diagnosis not present

## 2020-03-02 ENCOUNTER — Encounter: Payer: Self-pay | Admitting: Gastroenterology

## 2020-03-05 DIAGNOSIS — J00 Acute nasopharyngitis [common cold]: Secondary | ICD-10-CM | POA: Diagnosis not present

## 2020-03-17 DIAGNOSIS — R05 Cough: Secondary | ICD-10-CM | POA: Diagnosis not present

## 2020-03-17 DIAGNOSIS — Z03818 Encounter for observation for suspected exposure to other biological agents ruled out: Secondary | ICD-10-CM | POA: Diagnosis not present

## 2020-03-23 ENCOUNTER — Emergency Department (HOSPITAL_BASED_OUTPATIENT_CLINIC_OR_DEPARTMENT_OTHER): Payer: Medicare HMO

## 2020-03-23 ENCOUNTER — Other Ambulatory Visit: Payer: Self-pay

## 2020-03-23 ENCOUNTER — Inpatient Hospital Stay (HOSPITAL_BASED_OUTPATIENT_CLINIC_OR_DEPARTMENT_OTHER)
Admission: EM | Admit: 2020-03-23 | Discharge: 2020-03-29 | DRG: 040 | Disposition: A | Discharge status: Home Health | Payer: Medicare HMO | Attending: Internal Medicine | Admitting: Internal Medicine

## 2020-03-23 ENCOUNTER — Encounter (HOSPITAL_BASED_OUTPATIENT_CLINIC_OR_DEPARTMENT_OTHER): Payer: Self-pay | Admitting: *Deleted

## 2020-03-23 DIAGNOSIS — Z20822 Contact with and (suspected) exposure to covid-19: Secondary | ICD-10-CM | POA: Diagnosis present

## 2020-03-23 DIAGNOSIS — N179 Acute kidney failure, unspecified: Secondary | ICD-10-CM | POA: Diagnosis present

## 2020-03-23 DIAGNOSIS — Z880 Allergy status to penicillin: Secondary | ICD-10-CM

## 2020-03-23 DIAGNOSIS — E1143 Type 2 diabetes mellitus with diabetic autonomic (poly)neuropathy: Secondary | ICD-10-CM | POA: Diagnosis present

## 2020-03-23 DIAGNOSIS — E1110 Type 2 diabetes mellitus with ketoacidosis without coma: Secondary | ICD-10-CM | POA: Diagnosis present

## 2020-03-23 DIAGNOSIS — E039 Hypothyroidism, unspecified: Secondary | ICD-10-CM | POA: Diagnosis present

## 2020-03-23 DIAGNOSIS — M810 Age-related osteoporosis without current pathological fracture: Secondary | ICD-10-CM | POA: Diagnosis present

## 2020-03-23 DIAGNOSIS — E782 Mixed hyperlipidemia: Secondary | ICD-10-CM | POA: Diagnosis present

## 2020-03-23 DIAGNOSIS — E669 Obesity, unspecified: Secondary | ICD-10-CM | POA: Diagnosis present

## 2020-03-23 DIAGNOSIS — R4182 Altered mental status, unspecified: Secondary | ICD-10-CM

## 2020-03-23 DIAGNOSIS — G9341 Metabolic encephalopathy: Secondary | ICD-10-CM | POA: Diagnosis present

## 2020-03-23 DIAGNOSIS — Z79899 Other long term (current) drug therapy: Secondary | ICD-10-CM

## 2020-03-23 DIAGNOSIS — Z79890 Hormone replacement therapy (postmenopausal): Secondary | ICD-10-CM

## 2020-03-23 DIAGNOSIS — Z887 Allergy status to serum and vaccine status: Secondary | ICD-10-CM

## 2020-03-23 DIAGNOSIS — Z7982 Long term (current) use of aspirin: Secondary | ICD-10-CM | POA: Diagnosis not present

## 2020-03-23 DIAGNOSIS — K219 Gastro-esophageal reflux disease without esophagitis: Secondary | ICD-10-CM | POA: Diagnosis present

## 2020-03-23 DIAGNOSIS — K760 Fatty (change of) liver, not elsewhere classified: Secondary | ICD-10-CM | POA: Diagnosis present

## 2020-03-23 DIAGNOSIS — G4733 Obstructive sleep apnea (adult) (pediatric): Secondary | ICD-10-CM | POA: Diagnosis present

## 2020-03-23 DIAGNOSIS — I6621 Occlusion and stenosis of right posterior cerebral artery: Secondary | ICD-10-CM | POA: Diagnosis not present

## 2020-03-23 DIAGNOSIS — N390 Urinary tract infection, site not specified: Secondary | ICD-10-CM | POA: Diagnosis present

## 2020-03-23 DIAGNOSIS — R471 Dysarthria and anarthria: Secondary | ICD-10-CM | POA: Diagnosis present

## 2020-03-23 DIAGNOSIS — Z794 Long term (current) use of insulin: Secondary | ICD-10-CM | POA: Diagnosis not present

## 2020-03-23 DIAGNOSIS — Z91041 Radiographic dye allergy status: Secondary | ICD-10-CM

## 2020-03-23 DIAGNOSIS — F419 Anxiety disorder, unspecified: Secondary | ICD-10-CM | POA: Diagnosis present

## 2020-03-23 DIAGNOSIS — R0602 Shortness of breath: Secondary | ICD-10-CM | POA: Diagnosis not present

## 2020-03-23 DIAGNOSIS — E1165 Type 2 diabetes mellitus with hyperglycemia: Secondary | ICD-10-CM | POA: Diagnosis not present

## 2020-03-23 DIAGNOSIS — K3184 Gastroparesis: Secondary | ICD-10-CM | POA: Diagnosis present

## 2020-03-23 DIAGNOSIS — F329 Major depressive disorder, single episode, unspecified: Secondary | ICD-10-CM | POA: Diagnosis present

## 2020-03-23 DIAGNOSIS — R410 Disorientation, unspecified: Secondary | ICD-10-CM | POA: Diagnosis not present

## 2020-03-23 DIAGNOSIS — I10 Essential (primary) hypertension: Secondary | ICD-10-CM

## 2020-03-23 DIAGNOSIS — J3489 Other specified disorders of nose and nasal sinuses: Secondary | ICD-10-CM | POA: Diagnosis not present

## 2020-03-23 DIAGNOSIS — I110 Hypertensive heart disease with heart failure: Secondary | ICD-10-CM | POA: Diagnosis present

## 2020-03-23 DIAGNOSIS — Z87891 Personal history of nicotine dependence: Secondary | ICD-10-CM

## 2020-03-23 DIAGNOSIS — I083 Combined rheumatic disorders of mitral, aortic and tricuspid valves: Secondary | ICD-10-CM | POA: Diagnosis not present

## 2020-03-23 DIAGNOSIS — R531 Weakness: Secondary | ICD-10-CM | POA: Diagnosis present

## 2020-03-23 DIAGNOSIS — Z7902 Long term (current) use of antithrombotics/antiplatelets: Secondary | ICD-10-CM | POA: Diagnosis not present

## 2020-03-23 DIAGNOSIS — I63512 Cerebral infarction due to unspecified occlusion or stenosis of left middle cerebral artery: Principal | ICD-10-CM | POA: Diagnosis present

## 2020-03-23 DIAGNOSIS — G43909 Migraine, unspecified, not intractable, without status migrainosus: Secondary | ICD-10-CM | POA: Diagnosis present

## 2020-03-23 DIAGNOSIS — I6389 Other cerebral infarction: Secondary | ICD-10-CM | POA: Diagnosis not present

## 2020-03-23 DIAGNOSIS — E1169 Type 2 diabetes mellitus with other specified complication: Secondary | ICD-10-CM | POA: Diagnosis present

## 2020-03-23 DIAGNOSIS — E876 Hypokalemia: Secondary | ICD-10-CM | POA: Diagnosis not present

## 2020-03-23 DIAGNOSIS — Z885 Allergy status to narcotic agent status: Secondary | ICD-10-CM

## 2020-03-23 DIAGNOSIS — Z888 Allergy status to other drugs, medicaments and biological substances status: Secondary | ICD-10-CM

## 2020-03-23 DIAGNOSIS — G8929 Other chronic pain: Secondary | ICD-10-CM | POA: Diagnosis present

## 2020-03-23 DIAGNOSIS — I639 Cerebral infarction, unspecified: Secondary | ICD-10-CM | POA: Diagnosis not present

## 2020-03-23 DIAGNOSIS — R5383 Other fatigue: Secondary | ICD-10-CM | POA: Diagnosis not present

## 2020-03-23 DIAGNOSIS — E785 Hyperlipidemia, unspecified: Secondary | ICD-10-CM

## 2020-03-23 DIAGNOSIS — Z833 Family history of diabetes mellitus: Secondary | ICD-10-CM

## 2020-03-23 DIAGNOSIS — R9082 White matter disease, unspecified: Secondary | ICD-10-CM | POA: Diagnosis not present

## 2020-03-23 DIAGNOSIS — I5032 Chronic diastolic (congestive) heart failure: Secondary | ICD-10-CM | POA: Diagnosis present

## 2020-03-23 DIAGNOSIS — J45909 Unspecified asthma, uncomplicated: Secondary | ICD-10-CM | POA: Diagnosis present

## 2020-03-23 DIAGNOSIS — R079 Chest pain, unspecified: Secondary | ICD-10-CM | POA: Diagnosis not present

## 2020-03-23 DIAGNOSIS — Z882 Allergy status to sulfonamides status: Secondary | ICD-10-CM

## 2020-03-23 DIAGNOSIS — Z8249 Family history of ischemic heart disease and other diseases of the circulatory system: Secondary | ICD-10-CM

## 2020-03-23 DIAGNOSIS — E1142 Type 2 diabetes mellitus with diabetic polyneuropathy: Secondary | ICD-10-CM | POA: Diagnosis not present

## 2020-03-23 DIAGNOSIS — I6782 Cerebral ischemia: Secondary | ICD-10-CM | POA: Diagnosis not present

## 2020-03-23 DIAGNOSIS — Z6838 Body mass index (BMI) 38.0-38.9, adult: Secondary | ICD-10-CM

## 2020-03-23 DIAGNOSIS — Z7951 Long term (current) use of inhaled steroids: Secondary | ICD-10-CM

## 2020-03-23 LAB — I-STAT VENOUS BLOOD GAS, ED
Acid-base deficit: 4 mmol/L — ABNORMAL HIGH (ref 0.0–2.0)
Bicarbonate: 22 mmol/L (ref 20.0–28.0)
Calcium, Ion: 1.27 mmol/L (ref 1.15–1.40)
HCT: 40 % (ref 36.0–46.0)
Hemoglobin: 13.6 g/dL (ref 12.0–15.0)
O2 Saturation: 80 %
Potassium: 3.8 mmol/L (ref 3.5–5.1)
Sodium: 133 mmol/L — ABNORMAL LOW (ref 135–145)
TCO2: 23 mmol/L (ref 22–32)
pCO2, Ven: 40.7 mmHg — ABNORMAL LOW (ref 44.0–60.0)
pH, Ven: 7.341 (ref 7.250–7.430)
pO2, Ven: 47 mmHg — ABNORMAL HIGH (ref 32.0–45.0)

## 2020-03-23 LAB — COMPREHENSIVE METABOLIC PANEL
ALT: 62 U/L — ABNORMAL HIGH (ref 0–44)
ALT: 55 U/L — ABNORMAL HIGH (ref 0–44)
AST: 50 U/L — ABNORMAL HIGH (ref 15–41)
AST: 37 U/L (ref 15–41)
Albumin: 4.2 g/dL (ref 3.5–5.0)
Albumin: 3.9 g/dL (ref 3.5–5.0)
Alkaline Phosphatase: 71 U/L (ref 38–126)
Alkaline Phosphatase: 65 U/L (ref 38–126)
Anion gap: 16 — ABNORMAL HIGH (ref 5–15)
Anion gap: 10 (ref 5–15)
BUN: 36 mg/dL — ABNORMAL HIGH (ref 6–20)
BUN: 28 mg/dL — ABNORMAL HIGH (ref 6–20)
CO2: 18 mmol/L — ABNORMAL LOW (ref 22–32)
CO2: 22 mmol/L (ref 22–32)
Calcium: 9.1 mg/dL (ref 8.9–10.3)
Calcium: 9 mg/dL (ref 8.9–10.3)
Chloride: 97 mmol/L — ABNORMAL LOW (ref 98–111)
Chloride: 101 mmol/L (ref 98–111)
Creatinine, Ser: 1.44 mg/dL — ABNORMAL HIGH (ref 0.44–1.00)
Creatinine, Ser: 0.86 mg/dL (ref 0.44–1.00)
GFR calc Af Amer: 48 mL/min — ABNORMAL LOW (ref >60)
GFR calc Af Amer: >60 mL/min (ref >60)
GFR calc non Af Amer: 41 mL/min — ABNORMAL LOW (ref >60)
GFR calc non Af Amer: >60 mL/min (ref >60)
Glucose, Bld: 484 mg/dL — ABNORMAL HIGH (ref 70–99)
Glucose, Bld: 191 mg/dL — ABNORMAL HIGH (ref 70–99)
Potassium: 3.7 mmol/L (ref 3.5–5.1)
Potassium: 3.2 mmol/L — ABNORMAL LOW (ref 3.5–5.1)
Sodium: 131 mmol/L — ABNORMAL LOW (ref 135–145)
Sodium: 133 mmol/L — ABNORMAL LOW (ref 135–145)
Total Bilirubin: 0.6 mg/dL (ref 0.3–1.2)
Total Bilirubin: 0.5 mg/dL (ref 0.3–1.2)
Total Protein: 7.4 g/dL (ref 6.5–8.1)
Total Protein: 7.1 g/dL (ref 6.5–8.1)

## 2020-03-23 LAB — CBC WITH DIFFERENTIAL/PLATELET
Abs Immature Granulocytes: 0.1 10*3/uL — ABNORMAL HIGH (ref 0.00–0.07)
Basophils Absolute: 0.2 10*3/uL — ABNORMAL HIGH (ref 0.0–0.1)
Basophils Relative: 1 %
Eosinophils Absolute: 0.7 10*3/uL — ABNORMAL HIGH (ref 0.0–0.5)
Eosinophils Relative: 5 %
HCT: 39.3 % (ref 36.0–46.0)
Hemoglobin: 13 g/dL (ref 12.0–15.0)
Immature Granulocytes: 1 %
Lymphocytes Relative: 37 %
Lymphs Abs: 5.3 10*3/uL — ABNORMAL HIGH (ref 0.7–4.0)
MCH: 28.6 pg (ref 26.0–34.0)
MCHC: 33.1 g/dL (ref 30.0–36.0)
MCV: 86.6 fL (ref 80.0–100.0)
Monocytes Absolute: 1 10*3/uL (ref 0.1–1.0)
Monocytes Relative: 7 %
Neutro Abs: 6.9 10*3/uL (ref 1.7–7.7)
Neutrophils Relative %: 49 %
Platelets: 372 10*3/uL (ref 150–400)
RBC: 4.54 MIL/uL (ref 3.87–5.11)
RDW: 13.4 % (ref 11.5–15.5)
WBC: 14.2 10*3/uL — ABNORMAL HIGH (ref 4.0–10.5)
nRBC: 0 % (ref 0.0–0.2)

## 2020-03-23 LAB — URINALYSIS, MICROSCOPIC (REFLEX)

## 2020-03-23 LAB — URINALYSIS, ROUTINE W REFLEX MICROSCOPIC
Bilirubin Urine: NEGATIVE
Glucose, UA: >=500 mg/dL — AB
Hgb urine dipstick: NEGATIVE
Ketones, ur: NEGATIVE mg/dL
Nitrite: NEGATIVE
Protein, ur: NEGATIVE mg/dL
Specific Gravity, Urine: 1.025 (ref 1.005–1.030)
pH: 6 (ref 5.0–8.0)

## 2020-03-23 LAB — CBG MONITORING, ED
Glucose-Capillary: 417 mg/dL — ABNORMAL HIGH (ref 70–99)
Glucose-Capillary: 441 mg/dL — ABNORMAL HIGH (ref 70–99)
Glucose-Capillary: 322 mg/dL — ABNORMAL HIGH (ref 70–99)
Glucose-Capillary: 207 mg/dL — ABNORMAL HIGH (ref 70–99)
Glucose-Capillary: 189 mg/dL — ABNORMAL HIGH (ref 70–99)
Glucose-Capillary: 194 mg/dL — ABNORMAL HIGH (ref 70–99)
Glucose-Capillary: 196 mg/dL — ABNORMAL HIGH (ref 70–99)

## 2020-03-23 LAB — SARS CORONAVIRUS 2 BY RT PCR (HOSPITAL ORDER, PERFORMED IN ~~LOC~~ HOSPITAL LAB): SARS Coronavirus 2: NEGATIVE

## 2020-03-23 LAB — BETA-HYDROXYBUTYRIC ACID: Beta-Hydroxybutyric Acid: 0.09 mmol/L (ref 0.05–0.27)

## 2020-03-23 LAB — PREGNANCY, URINE: Preg Test, Ur: NEGATIVE

## 2020-03-23 MED ORDER — FOSFOMYCIN TROMETHAMINE 3 G PO PACK
3.0000 g | PACK | Freq: Once | ORAL | Status: AC
  Administered 2020-03-23: 3 g via ORAL
  Filled 2020-03-23: qty 3

## 2020-03-23 MED ORDER — LACTATED RINGERS IV BOLUS
20.0000 mL/kg | Freq: Once | INTRAVENOUS | Status: AC
  Administered 2020-03-23: 1814 mL via INTRAVENOUS

## 2020-03-23 MED ORDER — LACTATED RINGERS IV SOLN: INTRAVENOUS | Status: DC

## 2020-03-23 MED ORDER — DEXTROSE-NACL 5-0.45 % IV SOLN: INTRAVENOUS | Status: DC

## 2020-03-23 MED ORDER — POTASSIUM CHLORIDE 10 MEQ/100ML IV SOLN
10.0000 meq | INTRAVENOUS | Status: AC
  Administered 2020-03-23 (×2): 10 meq via INTRAVENOUS
  Filled 2020-03-23 (×2): qty 100

## 2020-03-23 MED ORDER — INSULIN REGULAR(HUMAN) IN NACL 100-0.9 UT/100ML-% IV SOLN
INTRAVENOUS | Status: DC
  Administered 2020-03-23: 17 [IU]/h via INTRAVENOUS
  Administered 2020-03-24: 4.2 [IU]/h via INTRAVENOUS
  Filled 2020-03-23 (×2): qty 100

## 2020-03-23 MED ORDER — DEXTROSE 50 % IV SOLN: 0.0000 mL | INTRAVENOUS | Status: DC | PRN

## 2020-03-23 MED ORDER — DEXTROSE IN LACTATED RINGERS 5 % IV SOLN: INTRAVENOUS | Status: DC

## 2020-03-23 NOTE — ED Triage Notes (Signed)
Brother states confusion x 3 weeks, pt states " I feel out of it" , sent here by PMD for eval

## 2020-03-23 NOTE — ED Provider Notes (Signed)
[facility] Emergency Department Provider Note MRN:  [id number]  Arrival date & time: 03/23/20     Chief Complaint   Altered Mental Status   History of Present Illness   Cheryl [name] is a 54 y.o. year-old female with a history of diabetes presenting to the ED with chief complaint of altered mental status.  Confusion for 3 weeks, speech change about 3 days ago.  Patient feels out of it, confused.  General malaise, weakness.  No specific numbness or weakness to the arms or legs.  Denies headache.  Endorsing recent cough and subjective fever.  Denies chest pain or shortness of breath, no abdominal pain, no burning with urination.  Has been taking her insulin and other daily medications like normal.  Review of Systems  A complete 10 system review of systems was obtained and all systems are negative except as noted in the HPI and PMH.   Patient's Health History    Past Medical History:  Diagnosis Date  . Allergy   . Anxiety   . Asthma   . Cancer ([place])    melanoma 2015 upper Left arm   . Chronic airway obstruction, not elsewhere classified   . Chronic pain    "q where; herniated disc in my tailbone" (02/09/2013)  . DDD (degenerative disc disease)    CERVIAL AND LUMBAR  . Depression   . Edema   . Fatty liver disease, nonalcoholic   . Fibromyalgia    "severe" (02/09/2013)  . Gastroparesis    from DM and chronic narcotic use  . GERD (gastroesophageal reflux disease)   . Human parvovirus infection   . Hyperlipidemia   . Hypertension   . Hypothyroidism   . Interstitial cystitis   . Migraine headache    "weekly; worse lately" (02/09/2013)  . OSA (obstructive sleep apnea)    "have mask;; don't use it; no one came out to check it" (02/09/2013)  . Osteoporosis   . Peripheral neuropathy    "severe" (02/09/2013)  . 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 PONV (postoperative nausea and vomiting)   .  Positive PPD   . Shortness of breath    "at anytime; it's gotten worse recently" (02/09/2013)  . Sleep apnea   . Type II diabetes mellitus ([place])   . Vitamin D deficiency   . Vocal cord dysfunction     Past Surgical History:  Procedure Laterality Date  . ABDOMINAL HYSTERECTOMY  1993  . ANTERIOR CERVICAL DECOMP/DISCECTOMY FUSION  2004  . arm surgery     left 2018  . BRONCHOSCOPY  06/28/2019  . CARPAL TUNNEL RELEASE Bilateral ?1980's  . CHOLECYSTECTOMY  ?1990  . COLONOSCOPY    . HIP SURGERY Right 2002   "did something to the tibula band" (02/09/2013)  . KNEE ARTHROSCOPY Left 1990's   "fell; clot behind knee cap had to be removed" (02/09/2013)  . NASAL SINUS SURGERY  1986; 1990's   "i've had 3" (02/09/2013)  . RIGHT/LEFT HEART CATH AND CORONARY ANGIOGRAPHY N/A 08/26/2017   Procedure: RIGHT/LEFT HEART CATH AND CORONARY ANGIOGRAPHY;  Surgeon: Belva Crome, MD;  Location: [facility];  Service: Cardiovascular;  Laterality: N/A;  . SHOULDER ARTHROSCOPY W/ ROTATOR CUFF REPAIR Right    "had to go deep in rotator cuff" (02/09/2013)  . VIDEO BRONCHOSCOPY Bilateral 06/28/2019   Procedure: VIDEO BRONCHOSCOPY WITH FLUORO;  Surgeon: Marshell Garfinkel, MD;  Location: [place];  Service: Cardiopulmonary;  Laterality: Bilateral;    Family History  Problem Relation Age of Onset  . Coronary artery disease Father   . Diabetes Brother   . Diabetes Brother   . Breast cancer Maternal Aunt   . Breast cancer Cousin   . Breast cancer Cousin   . Colon cancer Neg Hx   . Esophageal cancer Neg Hx   . Liver cancer Neg Hx   . Pancreatic cancer Neg Hx   . Rectal cancer Neg Hx   . Stomach cancer Neg Hx     Social History   Socioeconomic History  . Marital status: Divorced    Spouse name: Not on file  . Number of children: Not on file  . Years of education: Not on file  . Highest education level: Not on file  Occupational History  . Not on file  Tobacco Use  . Smoking status: Former Smoker     Packs/day: 0.10    Years: 3.00    Pack years: 0.30    Types: Cigarettes    Quit date: 2010    Years since quitting: 11.6  . Smokeless tobacco: Never Used  . Tobacco comment: only smokes in high school then every now and then  Vaping Use  . Vaping Use: Never used  Substance and Sexual Activity  . Alcohol use: No  . Drug use: No  . Sexual activity: Never  Other Topics Concern  . Not on file  Social History Narrative   Lives with daughter in a one story home.  On disability.  Education: college.     Social Determinants of Health   Financial Resource Strain:   . Difficulty of Paying Living Expenses: Not on file  Food Insecurity: No Food Insecurity  . Worried About Charity fundraiser in the Last Year: Never true  . Ran Out of Food in the Last Year: Never true  Transportation Needs: No Transportation Needs  . Lack of Transportation (Medical): No  . Lack of Transportation (Non-Medical): No  Physical Activity:   . Days of Exercise per Week: Not on file  . Minutes of Exercise per Session: Not on file  Stress:   . Feeling of Stress : Not on file  Social Connections:   . Frequency of Communication with Friends and Family: Not on file  . Frequency of Social Gatherings with Friends and Family: Not on file  . Attends Religious Services: Not on file  . Active Member of Clubs or Organizations: Not on file  . Attends Archivist Meetings: Not on file  . Marital Status: Not on file  Intimate Partner Violence:   . Fear of Current or Ex-Partner: Not on file  . Emotionally Abused: Not on file  . Physically Abused: Not on file  . Sexually Abused: Not on file     Physical Exam   Vitals:   03/23/20 2130 03/23/20 2200  BP: (!) 145/78 126/64  Pulse: 66 63  Resp: 17 16  Temp: 98.6 F (37 C)   SpO2: 99% 96%    CONSTITUTIONAL: Well-appearing, NAD NEURO:  Alert and oriented x 3, slow speech, at times seems to have trouble forming words, normal and symmetric strength and  sensation, normal coordination, no visual field cuts EYES:  eyes equal and reactive ENT/NECK:  no LAD, no JVD CARDIO: Regular rate, well-perfused, normal S1 and S2 PULM:  CTAB no wheezing or rhonchi GI/GU:  normal bowel sounds, non-distended, non-tender MSK/SPINE:  No gross deformities, no edema SKIN:  no rash,  atraumatic PSYCH:  Appropriate speech and behavior  *Additional and/or pertinent findings included in MDM below  Diagnostic and Interventional Summary    EKG Interpretation  Date/Time:  March 23, 2020 at 18: 12: 41 Ventricular Rate:  60 PR Interval:    QRS Duration: 90   QT Interval:  454 QTC Calculation: 454   R Axis:     Text Interpretation: Sinus rhythm, no ischemic changes Confirm by Dr. Gerlene Fee at 11:06 PM      Labs Reviewed  CBC WITH DIFFERENTIAL/PLATELET - Abnormal; Notable for the following components:      Result Value   WBC 14.2 (*)    Lymphs Abs 5.3 (*)    Eosinophils Absolute 0.7 (*)    Basophils Absolute 0.2 (*)    Abs Immature Granulocytes 0.10 (*)    All other components within normal limits  COMPREHENSIVE METABOLIC PANEL - Abnormal; Notable for the following components:   Sodium 131 (*)    Chloride 97 (*)    CO2 18 (*)    Glucose, Bld 484 (*)    BUN 36 (*)    Creatinine, Ser 1.44 (*)    AST 50 (*)    ALT 62 (*)    GFR calc non Af Amer 41 (*)    GFR calc Af Amer 48 (*)    Anion gap 16 (*)    All other components within normal limits  URINALYSIS, ROUTINE W REFLEX MICROSCOPIC - Abnormal; Notable for the following components:   APPearance HAZY (*)    Glucose, UA >=500 (*)    Leukocytes,Ua TRACE (*)    All other components within normal limits  URINALYSIS, MICROSCOPIC (REFLEX) - Abnormal; Notable for the following components:   Bacteria, UA MANY (*)    All other components within normal limits  COMPREHENSIVE METABOLIC PANEL - Abnormal; Notable for the following components:   Sodium 133 (*)    Potassium 3.2 (*)    Glucose, Bld 191  (*)    BUN 28 (*)    ALT 55 (*)    All other components within normal limits  CBG MONITORING, ED - Abnormal; Notable for the following components:   Glucose-Capillary 441 (*)    All other components within normal limits  I-STAT VENOUS BLOOD GAS, ED - Abnormal; Notable for the following components:   pCO2, Ven 40.7 (*)    pO2, Ven 47.0 (*)    Acid-base deficit 4.0 (*)    Sodium 133 (*)    All other components within normal limits  CBG MONITORING, ED - Abnormal; Notable for the following components:   Glucose-Capillary 417 (*)    All other components within normal limits  CBG MONITORING, ED - Abnormal; Notable for the following components:   Glucose-Capillary 322 (*)    All other components within normal limits  CBG MONITORING, ED - Abnormal; Notable for the following components:   Glucose-Capillary 207 (*)    All other components within normal limits  CBG MONITORING, ED - Abnormal; Notable for the following components:   Glucose-Capillary 189 (*)    All other components within normal limits  CBG MONITORING, ED - Abnormal; Notable for the following components:   Glucose-Capillary 194 (*)    All other components within normal limits  SARS CORONAVIRUS 2 BY RT PCR (HOSPITAL ORDER, [place])  PREGNANCY, URINE  BETA-HYDROXYBUTYRIC ACID  BETA-HYDROXYBUTYRIC ACID  BETA-HYDROXYBUTYRIC ACID    DG Chest Port 1 View  Final Result    CT  Head Wo Contrast  Final Result      Medications  insulin regular, human (MYXREDLIN) 100 units/ 100 mL infusion ( Intravenous Rate/Dose Verify 03/23/20 2213)  lactated ringers infusion ( Intravenous Stopped 03/23/20 2126)  dextrose 5 % in lactated ringers infusion ( Intravenous Not Given 03/23/20 2018)  dextrose 50 % solution 0-50 mL (has no administration in time range)  dextrose 5 %-0.45 % sodium chloride infusion ( Intravenous Rate/Dose Verify 03/23/20 2125)  lactated ringers bolus 1,814 mL (0 mL/kg  90.7 kg Intravenous  Stopped 03/23/20 2011)  potassium chloride 10 mEq in 100 mL IVPB ( Intravenous Stopped 03/23/20 2018)  fosfomycin (MONUROL) packet 3 g (3 g Oral Given 03/23/20 1747)     Procedures  /  Critical Care .Critical Care Performed by: Maudie Flakes, MD Authorized by: Maudie Flakes, MD   Critical care provider statement:    Critical care time (minutes):  45   Critical care was necessary to treat or prevent imminent or life-threatening deterioration of the following conditions: Diabetic ketoacidosis, also concern for acute ischemic stroke.   Critical care was time spent personally by me on the following activities:  Discussions with consultants, evaluation of patient's response to treatment, examination of patient, ordering and performing treatments and interventions, ordering and review of laboratory studies, ordering and review of radiographic studies, pulse oximetry, re-evaluation of patient's condition, obtaining history from patient or surrogate and review of old charts    ED Course and Medical Decision Making  I have reviewed the triage vital signs, the nursing notes, and pertinent available records from the EMR.  Listed above are laboratory and imaging tests that I personally ordered, reviewed, and interpreted and then considered in my medical decision making (see below for details).  Altered mental status, speech disturbance, CT head revealing possible ischemic stroke.  Last known well 2 or 3 days ago, outside of any window for TPA or code stroke initiation for IR intervention.  Patient also appears to have diabetic ketoacidosis, possibly triggered by UTI or pneumonia.  Providing IV ceftriaxone, insulin drip, IV fluids, will admit to hospitalist service for further care.       Barth Kirks. Sedonia Small, MD [place] [email or web address]  Final Clinical Impressions(s) / ED Diagnoses     ICD-10-CM   1. Altered mental status, unspecified altered  mental status type  R41.82   2. Diabetic ketoacidosis without coma associated with type 2 diabetes mellitus ([place])  E11.10   3. Acute ischemic stroke [facility])  I63.9     ED Discharge Orders    None       Discharge Instructions Discussed with and Provided to Patient:   Discharge Instructions   None       Maudie Flakes, MD 03/23/20 2306

## 2020-03-24 ENCOUNTER — Encounter (HOSPITAL_COMMUNITY): Payer: Self-pay | Admitting: Internal Medicine

## 2020-03-24 ENCOUNTER — Inpatient Hospital Stay (HOSPITAL_COMMUNITY): Payer: Medicare HMO

## 2020-03-24 DIAGNOSIS — E039 Hypothyroidism, unspecified: Secondary | ICD-10-CM

## 2020-03-24 DIAGNOSIS — I639 Cerebral infarction, unspecified: Secondary | ICD-10-CM | POA: Diagnosis not present

## 2020-03-24 DIAGNOSIS — I6389 Other cerebral infarction: Secondary | ICD-10-CM | POA: Diagnosis not present

## 2020-03-24 DIAGNOSIS — K760 Fatty (change of) liver, not elsewhere classified: Secondary | ICD-10-CM | POA: Diagnosis present

## 2020-03-24 DIAGNOSIS — F419 Anxiety disorder, unspecified: Secondary | ICD-10-CM | POA: Diagnosis present

## 2020-03-24 DIAGNOSIS — I10 Essential (primary) hypertension: Secondary | ICD-10-CM

## 2020-03-24 DIAGNOSIS — F329 Major depressive disorder, single episode, unspecified: Secondary | ICD-10-CM | POA: Diagnosis present

## 2020-03-24 DIAGNOSIS — E1110 Type 2 diabetes mellitus with ketoacidosis without coma: Secondary | ICD-10-CM

## 2020-03-24 DIAGNOSIS — M810 Age-related osteoporosis without current pathological fracture: Secondary | ICD-10-CM | POA: Diagnosis present

## 2020-03-24 DIAGNOSIS — R0602 Shortness of breath: Secondary | ICD-10-CM | POA: Diagnosis not present

## 2020-03-24 DIAGNOSIS — I63512 Cerebral infarction due to unspecified occlusion or stenosis of left middle cerebral artery: Secondary | ICD-10-CM | POA: Diagnosis present

## 2020-03-24 DIAGNOSIS — G9341 Metabolic encephalopathy: Secondary | ICD-10-CM | POA: Diagnosis present

## 2020-03-24 DIAGNOSIS — E1143 Type 2 diabetes mellitus with diabetic autonomic (poly)neuropathy: Secondary | ICD-10-CM | POA: Diagnosis present

## 2020-03-24 DIAGNOSIS — Z7982 Long term (current) use of aspirin: Secondary | ICD-10-CM | POA: Diagnosis not present

## 2020-03-24 DIAGNOSIS — N390 Urinary tract infection, site not specified: Secondary | ICD-10-CM | POA: Diagnosis present

## 2020-03-24 DIAGNOSIS — Z794 Long term (current) use of insulin: Secondary | ICD-10-CM | POA: Diagnosis not present

## 2020-03-24 DIAGNOSIS — J45909 Unspecified asthma, uncomplicated: Secondary | ICD-10-CM

## 2020-03-24 DIAGNOSIS — E1169 Type 2 diabetes mellitus with other specified complication: Secondary | ICD-10-CM

## 2020-03-24 DIAGNOSIS — R079 Chest pain, unspecified: Secondary | ICD-10-CM | POA: Diagnosis not present

## 2020-03-24 DIAGNOSIS — Z20822 Contact with and (suspected) exposure to covid-19: Secondary | ICD-10-CM | POA: Diagnosis present

## 2020-03-24 DIAGNOSIS — N179 Acute kidney failure, unspecified: Secondary | ICD-10-CM | POA: Diagnosis present

## 2020-03-24 DIAGNOSIS — I110 Hypertensive heart disease with heart failure: Secondary | ICD-10-CM | POA: Diagnosis present

## 2020-03-24 DIAGNOSIS — E782 Mixed hyperlipidemia: Secondary | ICD-10-CM | POA: Diagnosis present

## 2020-03-24 DIAGNOSIS — I5032 Chronic diastolic (congestive) heart failure: Secondary | ICD-10-CM | POA: Diagnosis present

## 2020-03-24 DIAGNOSIS — K219 Gastro-esophageal reflux disease without esophagitis: Secondary | ICD-10-CM

## 2020-03-24 DIAGNOSIS — Z7902 Long term (current) use of antithrombotics/antiplatelets: Secondary | ICD-10-CM | POA: Diagnosis not present

## 2020-03-24 DIAGNOSIS — K3184 Gastroparesis: Secondary | ICD-10-CM | POA: Diagnosis present

## 2020-03-24 DIAGNOSIS — E876 Hypokalemia: Secondary | ICD-10-CM | POA: Diagnosis present

## 2020-03-24 DIAGNOSIS — G4733 Obstructive sleep apnea (adult) (pediatric): Secondary | ICD-10-CM | POA: Diagnosis present

## 2020-03-24 LAB — RAPID URINE DRUG SCREEN, HOSP PERFORMED
Amphetamines: NOT DETECTED
Barbiturates: NOT DETECTED
Benzodiazepines: POSITIVE — AB
Cocaine: NOT DETECTED
Opiates: NOT DETECTED
Tetrahydrocannabinol: NOT DETECTED

## 2020-03-24 LAB — COMPREHENSIVE METABOLIC PANEL
ALT: 52 U/L — ABNORMAL HIGH (ref 0–44)
AST: 33 U/L (ref 15–41)
Albumin: 3.6 g/dL (ref 3.5–5.0)
Alkaline Phosphatase: 64 U/L (ref 38–126)
Anion gap: 11 (ref 5–15)
BUN: 16 mg/dL (ref 6–20)
CO2: 24 mmol/L (ref 22–32)
Calcium: 9.4 mg/dL (ref 8.9–10.3)
Chloride: 105 mmol/L (ref 98–111)
Creatinine, Ser: 0.73 mg/dL (ref 0.44–1.00)
GFR calc Af Amer: >60 mL/min (ref >60)
GFR calc non Af Amer: >60 mL/min (ref >60)
Glucose, Bld: 164 mg/dL — ABNORMAL HIGH (ref 70–99)
Potassium: 3 mmol/L — ABNORMAL LOW (ref 3.5–5.1)
Sodium: 140 mmol/L (ref 135–145)
Total Bilirubin: 0.5 mg/dL (ref 0.3–1.2)
Total Protein: 6.3 g/dL — ABNORMAL LOW (ref 6.5–8.1)

## 2020-03-24 LAB — TSH: TSH: 2.512 u[IU]/mL (ref 0.350–4.500)

## 2020-03-24 LAB — RPR: RPR Ser Ql: NONREACTIVE

## 2020-03-24 LAB — LIPID PANEL
Cholesterol: 217 mg/dL — ABNORMAL HIGH (ref 0–200)
HDL: 82 mg/dL (ref >40)
LDL Cholesterol: 108 mg/dL — ABNORMAL HIGH (ref 0–99)
Total CHOL/HDL Ratio: 2.6 RATIO
Triglycerides: 136 mg/dL (ref <150)
VLDL: 27 mg/dL (ref 0–40)

## 2020-03-24 LAB — GLUCOSE, CAPILLARY
Glucose-Capillary: 154 mg/dL — ABNORMAL HIGH (ref 70–99)
Glucose-Capillary: 189 mg/dL — ABNORMAL HIGH (ref 70–99)
Glucose-Capillary: 171 mg/dL — ABNORMAL HIGH (ref 70–99)
Glucose-Capillary: 164 mg/dL — ABNORMAL HIGH (ref 70–99)
Glucose-Capillary: 158 mg/dL — ABNORMAL HIGH (ref 70–99)
Glucose-Capillary: 164 mg/dL — ABNORMAL HIGH (ref 70–99)
Glucose-Capillary: 159 mg/dL — ABNORMAL HIGH (ref 70–99)
Glucose-Capillary: 204 mg/dL — ABNORMAL HIGH (ref 70–99)
Glucose-Capillary: 168 mg/dL — ABNORMAL HIGH (ref 70–99)
Glucose-Capillary: 290 mg/dL — ABNORMAL HIGH (ref 70–99)

## 2020-03-24 LAB — CBC WITH DIFFERENTIAL/PLATELET
Abs Immature Granulocytes: 0.06 10*3/uL (ref 0.00–0.07)
Basophils Absolute: 0.1 10*3/uL (ref 0.0–0.1)
Basophils Relative: 1 %
Eosinophils Absolute: 0.7 10*3/uL — ABNORMAL HIGH (ref 0.0–0.5)
Eosinophils Relative: 7 %
HCT: 35.6 % — ABNORMAL LOW (ref 36.0–46.0)
Hemoglobin: 12 g/dL (ref 12.0–15.0)
Immature Granulocytes: 1 %
Lymphocytes Relative: 39 %
Lymphs Abs: 3.9 10*3/uL (ref 0.7–4.0)
MCH: 28.4 pg (ref 26.0–34.0)
MCHC: 33.7 g/dL (ref 30.0–36.0)
MCV: 84.2 fL (ref 80.0–100.0)
Monocytes Absolute: 0.7 10*3/uL (ref 0.1–1.0)
Monocytes Relative: 7 %
Neutro Abs: 4.7 10*3/uL (ref 1.7–7.7)
Neutrophils Relative %: 45 %
Platelets: 255 10*3/uL (ref 150–400)
RBC: 4.23 MIL/uL (ref 3.87–5.11)
RDW: 13.2 % (ref 11.5–15.5)
WBC: 10.1 10*3/uL (ref 4.0–10.5)
nRBC: 0 % (ref 0.0–0.2)

## 2020-03-24 LAB — BETA-HYDROXYBUTYRIC ACID: Beta-Hydroxybutyric Acid: 0.08 mmol/L (ref 0.05–0.27)

## 2020-03-24 LAB — FOLATE: Folate: 15.1 ng/mL (ref >5.9)

## 2020-03-24 LAB — CBG MONITORING, ED: Glucose-Capillary: 200 mg/dL — ABNORMAL HIGH (ref 70–99)

## 2020-03-24 LAB — SEDIMENTATION RATE: Sed Rate: 6 mm/hr (ref 0–22)

## 2020-03-24 LAB — VITAMIN B12: Vitamin B-12: 678 pg/mL (ref 180–914)

## 2020-03-24 LAB — AMMONIA: Ammonia: 38 umol/L — ABNORMAL HIGH (ref 9–35)

## 2020-03-24 LAB — C-REACTIVE PROTEIN: CRP: 0.7 mg/dL (ref <1.0)

## 2020-03-24 MED ORDER — INSULIN ASPART 100 UNIT/ML ~~LOC~~ SOLN
0.0000 [IU] | Freq: Three times a day (TID) | SUBCUTANEOUS | Status: DC
  Administered 2020-03-24: 8 [IU] via SUBCUTANEOUS
  Administered 2020-03-24: 3 [IU] via SUBCUTANEOUS
  Administered 2020-03-25: 8 [IU] via SUBCUTANEOUS
  Administered 2020-03-25: 5 [IU] via SUBCUTANEOUS
  Administered 2020-03-25 – 2020-03-26 (×2): 8 [IU] via SUBCUTANEOUS
  Administered 2020-03-26: 11 [IU] via SUBCUTANEOUS
  Administered 2020-03-26: 5 [IU] via SUBCUTANEOUS
  Administered 2020-03-27: 6 [IU] via SUBCUTANEOUS
  Administered 2020-03-28: 8 [IU] via SUBCUTANEOUS
  Administered 2020-03-28: 11 [IU] via SUBCUTANEOUS
  Administered 2020-03-28: 5 [IU] via SUBCUTANEOUS
  Administered 2020-03-29: 3 [IU] via SUBCUTANEOUS
  Administered 2020-03-29: 2 [IU] via SUBCUTANEOUS

## 2020-03-24 MED ORDER — FLUTICASONE FUROATE-VILANTEROL 100-25 MCG/INH IN AEPB
1.0000 | INHALATION_SPRAY | Freq: Every day | RESPIRATORY_TRACT | Status: DC
  Administered 2020-03-25 – 2020-03-29 (×5): 1 via RESPIRATORY_TRACT
  Filled 2020-03-24: qty 28

## 2020-03-24 MED ORDER — NEBIVOLOL HCL 10 MG PO TABS
10.0000 mg | ORAL_TABLET | Freq: Every day | ORAL | Status: DC
  Administered 2020-03-24 – 2020-03-29 (×6): 10 mg via ORAL
  Filled 2020-03-24 (×6): qty 1

## 2020-03-24 MED ORDER — ACETAMINOPHEN 160 MG/5ML PO SOLN: 650.0000 mg | ORAL | Status: DC | PRN

## 2020-03-24 MED ORDER — IPRATROPIUM-ALBUTEROL 0.5-2.5 (3) MG/3ML IN SOLN
3.0000 mL | RESPIRATORY_TRACT | Status: DC | PRN
  Filled 2020-03-24: qty 3

## 2020-03-24 MED ORDER — ATORVASTATIN CALCIUM 10 MG PO TABS
20.0000 mg | ORAL_TABLET | Freq: Every day | ORAL | Status: DC
  Administered 2020-03-24: 20 mg via ORAL
  Filled 2020-03-24: qty 2

## 2020-03-24 MED ORDER — ACETAMINOPHEN 325 MG PO TABS: 650.0000 mg | ORAL_TABLET | ORAL | Status: DC | PRN

## 2020-03-24 MED ORDER — FLUTICASONE-UMECLIDIN-VILANT 100-62.5-25 MCG/INH IN AEPB: 1.0000 | INHALATION_SPRAY | Freq: Every day | RESPIRATORY_TRACT | Status: DC

## 2020-03-24 MED ORDER — POTASSIUM CHLORIDE CRYS ER 20 MEQ PO TBCR
40.0000 meq | EXTENDED_RELEASE_TABLET | ORAL | Status: AC
  Administered 2020-03-24 (×2): 40 meq via ORAL
  Filled 2020-03-24 (×2): qty 2

## 2020-03-24 MED ORDER — PANTOPRAZOLE SODIUM 40 MG PO TBEC
40.0000 mg | DELAYED_RELEASE_TABLET | Freq: Two times a day (BID) | ORAL | Status: DC
  Administered 2020-03-24 – 2020-03-29 (×11): 40 mg via ORAL
  Filled 2020-03-24 (×11): qty 1

## 2020-03-24 MED ORDER — LEVOTHYROXINE SODIUM 75 MCG PO TABS
75.0000 ug | ORAL_TABLET | Freq: Every day | ORAL | Status: DC
  Administered 2020-03-24 – 2020-03-29 (×6): 75 ug via ORAL
  Filled 2020-03-24 (×7): qty 1

## 2020-03-24 MED ORDER — ENOXAPARIN SODIUM 40 MG/0.4ML ~~LOC~~ SOLN
40.0000 mg | SUBCUTANEOUS | Status: DC
  Administered 2020-03-24 – 2020-03-29 (×6): 40 mg via SUBCUTANEOUS
  Filled 2020-03-24 (×6): qty 0.4

## 2020-03-24 MED ORDER — INSULIN ASPART 100 UNIT/ML ~~LOC~~ SOLN
4.0000 [IU] | Freq: Three times a day (TID) | SUBCUTANEOUS | Status: DC
  Administered 2020-03-24: 4 [IU] via SUBCUTANEOUS

## 2020-03-24 MED ORDER — ALBUTEROL SULFATE HFA 108 (90 BASE) MCG/ACT IN AERS: 2.0000 | INHALATION_SPRAY | Freq: Four times a day (QID) | RESPIRATORY_TRACT | Status: DC | PRN

## 2020-03-24 MED ORDER — STROKE: EARLY STAGES OF RECOVERY BOOK
Freq: Once | Status: AC
  Administered 2020-03-24: 1
  Filled 2020-03-24: qty 1

## 2020-03-24 MED ORDER — DILTIAZEM HCL ER COATED BEADS 120 MG PO CP24
240.0000 mg | ORAL_CAPSULE | Freq: Every day | ORAL | Status: DC
  Administered 2020-03-24 – 2020-03-29 (×6): 240 mg via ORAL
  Filled 2020-03-24 (×6): qty 2

## 2020-03-24 MED ORDER — ACETAMINOPHEN 650 MG RE SUPP: 650.0000 mg | RECTAL | Status: DC | PRN

## 2020-03-24 MED ORDER — FLUOXETINE HCL 20 MG PO CAPS
80.0000 mg | ORAL_CAPSULE | Freq: Every day | ORAL | Status: DC
  Administered 2020-03-24 – 2020-03-28 (×5): 80 mg via ORAL
  Filled 2020-03-24 (×5): qty 4

## 2020-03-24 MED ORDER — INSULIN ASPART 100 UNIT/ML ~~LOC~~ SOLN
0.0000 [IU] | Freq: Every day | SUBCUTANEOUS | Status: DC
  Administered 2020-03-24: 2 [IU] via SUBCUTANEOUS
  Administered 2020-03-25 – 2020-03-27 (×2): 3 [IU] via SUBCUTANEOUS

## 2020-03-24 MED ORDER — INSULIN GLARGINE 100 UNIT/ML ~~LOC~~ SOLN
45.0000 [IU] | Freq: Every day | SUBCUTANEOUS | Status: DC
  Administered 2020-03-24: 45 [IU] via SUBCUTANEOUS
  Filled 2020-03-24 (×2): qty 0.45

## 2020-03-24 MED ORDER — UMECLIDINIUM BROMIDE 62.5 MCG/INH IN AEPB
1.0000 | INHALATION_SPRAY | Freq: Every day | RESPIRATORY_TRACT | Status: DC
  Administered 2020-03-25 – 2020-03-29 (×5): 1 via RESPIRATORY_TRACT
  Filled 2020-03-24: qty 7

## 2020-03-24 NOTE — Consult Note (Addendum)
Neurology Consultation  Reason for Consult: Stroke   Referring Physician: Dr. Maylene Roes  CC: AMS  History is obtained from:chart and medical record   HPI: Cheryl [name] is a 54 y.o. female with past medical history of DM2, migraines, HTN, HLD, anxiety/depression, GERD, DCHF, hypothyroidism, nonalcoholic fatty liver disease, OSA and chronic pain who presents to the hospital with a 3 week history of lethargy and confusion (per chart, patient is slightly confused and a poor historian).  CT head revealed a left BG infarct.  Patient denies any recent fever, chills, N/V/D, headache, weakness, numbness/tingling or slurred speech.    Home medications include atorvastatin. She is not on a blood thinner or an antiplatelet medication.   UDS + benzos,  WBC 14.2 UA Positive for UTI  Neurology consulted for new acute/subacute stroke.    LKW: 3 weeks PTA  tpa given?: no, outside TPA window Premorbid modified Rankin scale (mRS): 1-No significant baseline disability and can perform usual duties    ROS: A 14 point ROS was performed and is negative except as noted in the HPI.   Past Medical History:  Diagnosis Date  . Allergy   . Anxiety   . Asthma   . Cancer ([place])    melanoma 2015 upper Left arm   . Chronic airway obstruction, not elsewhere classified   . Chronic pain    "q where; herniated disc in my tailbone" (02/09/2013)  . DDD (degenerative disc disease)    CERVIAL AND LUMBAR  . Depression   . Edema   . Fatty liver disease, nonalcoholic   . Fibromyalgia    "severe" (02/09/2013)  . Gastroparesis    from DM and chronic narcotic use  . GERD (gastroesophageal reflux disease)   . Human parvovirus infection   . Hyperlipidemia   . Hypertension   . Hypothyroidism   . Interstitial cystitis   . Migraine headache    "weekly; worse lately" (02/09/2013)  . OSA (obstructive sleep apnea)    "have mask;; don't use it; no one came out to check it" (02/09/2013)  . Osteoporosis   . Peripheral  neuropathy    "severe" (02/09/2013)  . 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 PONV (postoperative nausea and vomiting)   . Positive PPD   . Shortness of breath    "at anytime; it's gotten worse recently" (02/09/2013)  . Sleep apnea   . Type II diabetes mellitus ([place])   . Vitamin D deficiency   . Vocal cord dysfunction      Family History  Problem Relation Age of Onset  . Coronary artery disease Father   . Diabetes Brother   . Diabetes Brother   . Breast cancer Maternal Aunt   . Breast cancer Cousin   . Breast cancer Cousin   . Colon cancer Neg Hx   . Esophageal cancer Neg Hx   . Liver cancer Neg Hx   . Pancreatic cancer Neg Hx   . Rectal cancer Neg Hx   . Stomach cancer Neg Hx     Social History:   reports that she quit smoking about 11 years ago. Her smoking use included cigarettes. She has a 0.30 pack-year smoking history. She has never used smokeless tobacco. She reports that she does not drink alcohol and does not use drugs.   Home Medications: No current facility-administered medications on file prior to encounter.   Current Outpatient Medications on File Prior to Encounter  Medication Sig Dispense Refill  .  albuterol (VENTOLIN HFA) 108 (90 Base) MCG/ACT inhaler INHALE 2 PUFFS EVERY 6 HOURS AS NEEDED FOR WHEEZING OR SHORTNESS OF BREATH. 18 g 2  . atorvastatin (LIPITOR) 20 MG tablet Take 20 mg by mouth daily.    [name] azithromycin (ZITHROMAX) 250 MG tablet Take 250 mg by mouth as directed. Started on 03-19-20 for 5 day therapy. Patient on day 3.    . Cholecalciferol (VITAMIN D3) 1000 units CAPS Take 1,000 Units by mouth daily.    . diazepam (VALIUM) 10 MG tablet Take 10 mg by mouth 2 (two) times daily as needed (panic attacks.).     [name] diltiazem (CARDIZEM CD) 240 MG 24 hr capsule TAKE 1 CAPSULE BY MOUTH EVERY MORNING ON AN EMPTY STOMACH - [name] DENIED, FAXED DR Johnsie Cancel 10/22/15 SS (Patient taking differently: Take 240 mg by  mouth daily. ) 30 capsule 5  . FLUoxetine (PROZAC) 40 MG capsule Take 80 mg by mouth at bedtime.     . Fluticasone-Umeclidin-Vilant (TRELEGY ELLIPTA) 100-62.5-25 MCG/INH AEPB Inhale 1 puff into the lungs daily. 2 each 0  . gabapentin (NEURONTIN) 300 MG capsule Take 600 mg by mouth at bedtime.     [name] HUMULIN R U-500 KWIKPEN 500 UNIT/ML kwikpen Inject 100 Units into the skin daily.     . hydrochlorothiazide (HYDRODIURIL) 25 MG tablet Take 25 mg by mouth daily.    . hydrOXYzine (ATARAX/VISTARIL) 25 MG tablet Take 50 mg by mouth at bedtime.    [name] ibuprofen (ADVIL,MOTRIN) 800 MG tablet Take 800 mg by mouth every 8 (eight) hours as needed (pain).     [name] ipratropium-albuterol (DUONEB) 0.5-2.5 (3) MG/3ML SOLN Take 60m by nebulization every 4-6 hours if needed (Patient taking differently: Inhale 3 mLs into the lungs every 4 (four) hours as needed (wheezing/shortness of breath.). ) 1080 mL 3  . levothyroxine (SYNTHROID, LEVOTHROID) 75 MCG tablet Take 75 mcg by mouth daily before breakfast.    . liraglutide (VICTOZA) 18 MG/3ML SOPN Inject 1.2 mg into the skin every evening.     .Marland Kitchenlosartan (COZAAR) 25 MG tablet Take 25 mg by mouth daily.    . methylPREDNISolone (MEDROL DOSEPAK) 4 MG TBPK tablet Take by mouth. Patient on dose pack. Patient on day 1 of therapy.    . metoCLOPramide (REGLAN) 10 MG tablet Take 10 mg by mouth 3 (three) times daily.    . nebivolol (BYSTOLIC) 10 MG tablet Take 1 tablet (10 mg total) by mouth daily. 90 tablet 3  . nystatin-triamcinolone (MYCOLOG II) cream Apply 1 application topically 2 (two) times daily as needed (yeast in skin folds).     . pantoprazole (PROTONIX) 40 MG tablet Take 1 tablet (40 mg total) by mouth 2 (two) times daily. 180 tablet 4  . pentosan polysulfate (ELMIRON) 100 MG capsule Take 100 mg by mouth 3 (three) times daily.    . promethazine (PHENERGAN) 25 MG tablet Take 25 mg by mouth 2 (two) times daily as needed for nausea.    . SUMAtriptan (IMITREX) 20 MG/ACT nasal  spray Place 20 mg into the nose every 2 (two) hours as needed for migraine or headache. May repeat in 2 hours if headache persists or recurs.    .Marland KitchentiZANidine (ZANAFLEX) 4 MG tablet Take 4 mg by mouth 3 (three) times daily. May hold one dose if needed to operate motor vehicle.    . metoCLOPramide (REGLAN) 5 MG tablet Take 1 tablet (5 mg total) by mouth 4 (four) times daily. 120 tablet 0  .  omeprazole (PRILOSEC) 40 MG capsule Take 1 capsule (40 mg total) by mouth 2 (two) times daily. MUST HAVE OFFICE VISIT FOR FURTHER REFILLS (Patient not taking: Reported on 03/24/2020) 180 capsule 0  . ondansetron (ZOFRAN ODT) 4 MG disintegrating tablet Take 1 tablet (4 mg total) by mouth every 6 (six) hours as needed for nausea or vomiting. (Patient not taking: Reported on 03/24/2020) 30 tablet 1     Inpatient Medications:  Current Facility-Administered Medications:  .  acetaminophen (TYLENOL) tablet 650 mg, 650 mg, Oral, Q4H PRN **OR** acetaminophen (TYLENOL) 160 MG/5ML solution 650 mg, 650 mg, Per Tube, Q4H PRN **OR** acetaminophen (TYLENOL) suppository 650 mg, 650 mg, Rectal, Q4H PRN, Shalhoub, Sherryll Burger, MD .  atorvastatin (LIPITOR) tablet 20 mg, 20 mg, Oral, Daily, Shalhoub, Sherryll Burger, MD, 20 mg at 03/24/20 0942 .  dextrose 50 % solution 0-50 mL, 0-50 mL, Intravenous, PRN, Maudie Flakes, MD .  diltiazem (CARDIZEM CD) 24 hr capsule 240 mg, 240 mg, Oral, Daily, Shalhoub, Sherryll Burger, MD, 240 mg at 03/24/20 0943 .  enoxaparin (LOVENOX) injection 40 mg, 40 mg, Subcutaneous, Q24H, Shalhoub, Sherryll Burger, MD, 40 mg at 03/24/20 0942 .  FLUoxetine (PROZAC) capsule 80 mg, 80 mg, Oral, QHS, Shalhoub, Sherryll Burger, MD .  fluticasone furoate-vilanterol (BREO ELLIPTA) 100-25 MCG/INH 1 puff, 1 puff, Inhalation, Daily, Shalhoub, Sherryll Burger, MD .  insulin aspart (novoLOG) injection 0-15 Units, 0-15 Units, Subcutaneous, TID WC, Dessa Phi, DO, 3 Units at 03/24/20 1153 .  insulin aspart (novoLOG) injection 0-5 Units, 0-5 Units,  Subcutaneous, QHS, Choi, Jennifer, DO .  insulin aspart (novoLOG) injection 4 Units, 4 Units, Subcutaneous, TID WC, Choi, Jennifer, DO .  insulin glargine (LANTUS) injection 45 Units, 45 Units, Subcutaneous, Daily, Dessa Phi, DO, 45 Units at 03/24/20 2725 .  ipratropium-albuterol (DUONEB) 0.5-2.5 (3) MG/3ML nebulizer solution 3 mL, 3 mL, Inhalation, Q4H PRN, Shalhoub, Sherryll Burger, MD .  levothyroxine (SYNTHROID) tablet 75 mcg, 75 mcg, Oral, QAC breakfast, Shalhoub, Sherryll Burger, MD, 75 mcg at 03/24/20 3664 .  nebivolol (BYSTOLIC) tablet 10 mg, 10 mg, Oral, Daily, Shalhoub, Sherryll Burger, MD, 10 mg at 03/24/20 0942 .  pantoprazole (PROTONIX) EC tablet 40 mg, 40 mg, Oral, BID, Shalhoub, Sherryll Burger, MD, 40 mg at 03/24/20 0940 .  umeclidinium bromide (INCRUSE ELLIPTA) 62.5 MCG/INH 1 puff, 1 puff, Inhalation, Daily, Shalhoub, Sherryll Burger, MD  Exam: Current vital signs: BP 127/62 (BP Location: Right Arm)   Pulse 70   Temp 98.1 F (36.7 C) (Oral)   Resp 14   Ht _0  (1.575 m)   Wt 95.2 kg   SpO2 97%   BMI 38.39 kg/m  Vital signs in last 24 hours: Temp:  [98.1 F (36.7 C)-99.6 F (37.6 C)] 98.1 F (36.7 C) (08/28 1150) Pulse Rate:  [53-70] 70 (08/28 1150) Resp:  [10-20] 14 (08/28 1150) BP: (122-154)/(58-83) 127/62 (08/28 1150) SpO2:  [95 %-100 %] 97 % (08/28 1150) Weight:  [90.7 kg-95.2 kg] 95.2 kg (08/28 0142)  GENERAL: Awake, alert in NAD HEENT: - Normocephalic and atraumatic, dry mm, no Thyromegally Eyes: normal conjunctive LUNGS - Clear to auscultation bilaterally with no wheezes CV - S1S2 RRR, no m/r/g, equal pulses bilaterally. ABDOMEN - Soft, nontender, nondistended with normoactive BS Ext: warm, well perfused, intact peripheral pulses, no edema Psych: flat affect   NEURO:  Mental Status: AA&Ox3 to person, place and time.  Unable to state correct president of the [name] or her correct age or date of birth Language: speech is  clear, but appears to be pressured.  Naming, repetition, fluency,  and comprehension intact. Cranial Nerves: PERRL 3 mm/brisk. EOMI, visual fields full, no facial asymmetry, facial sensation intact, hearing intact, tongue/uvula/soft palate midline, normal sternocleidomastoid and trapezius muscle strength. No evidence of tongue atrophy or fibrillations Motor: 5/5 in all 4 extremities  Tone: is normal and bulk is normal Sensation- Intact to light touch bilaterally Coordination: FTN intact bilaterally, no ataxia in BLE. Gait- deferred  NIHSS 1a Level of Conscious.: 0 1b LOC Questions: 1 1c LOC Commands: 0 2 Best Gaze: 0 3 Visual: 0 4 Facial Palsy: 0 5a Motor Arm - left: 0 5b Motor Arm - Right: 0 6a Motor Leg - Left: 0 6b Motor Leg - Right: 0 7 Limb Ataxia: 1 8 Sensory: 0 9 Best Language: 0 10 Dysarthria: 0 11 Extinct. and Inatten.: 0 TOTAL: 2   Labs I have reviewed labs in epic and the results pertinent to this consultation are:   CBC    Component Value Date/Time   WBC 10.1 03/24/2020 0428   RBC 4.23 03/24/2020 0428   HGB 12.0 03/24/2020 0428   HGB 11.6 08/21/2017 1117   HGB 13.4 02/11/2016 0817   HCT 35.6 (L) 03/24/2020 0428   HCT 32.5 (L) 08/21/2017 1117   HCT 38.4 02/11/2016 0817   PLT 255 03/24/2020 0428   PLT 342 08/21/2017 1117   MCV 84.2 03/24/2020 0428   MCV 84 08/21/2017 1117   MCV 84.0 02/11/2016 0817   MCH 28.4 03/24/2020 0428   MCHC 33.7 03/24/2020 0428   RDW 13.2 03/24/2020 0428   RDW 15.4 08/21/2017 1117   RDW 14.0 02/11/2016 0817   LYMPHSABS 3.9 03/24/2020 0428   LYMPHSABS 3.8 (H) 08/21/2017 1117   LYMPHSABS 4.1 (H) 02/11/2016 0817   MONOABS 0.7 03/24/2020 0428   MONOABS 0.7 02/11/2016 0817   EOSABS 0.7 (H) 03/24/2020 0428   EOSABS 0.5 (H) 08/21/2017 1117   BASOSABS 0.1 03/24/2020 0428   BASOSABS 0.1 08/21/2017 1117   BASOSABS 0.0 02/11/2016 0817    CMP     Component Value Date/Time   NA 140 03/24/2020 0428   NA 142 08/21/2017 1117   NA 138 02/11/2016 0817   K 3.0 (L) 03/24/2020 0428   K 3.7  02/11/2016 0817   CL 105 03/24/2020 0428   CO2 24 03/24/2020 0428   CO2 23 02/11/2016 0817   GLUCOSE 164 (H) 03/24/2020 0428   GLUCOSE 280 (H) 02/11/2016 0817   BUN 16 03/24/2020 0428   BUN 13 08/21/2017 1117   BUN 8.2 02/11/2016 0817   CREATININE 0.73 03/24/2020 0428   CREATININE 0.8 02/11/2016 0817   CALCIUM 9.4 03/24/2020 0428   CALCIUM 9.7 02/11/2016 0817   PROT 6.3 (L) 03/24/2020 0428   PROT 8.0 02/11/2016 0817   ALBUMIN 3.6 03/24/2020 0428   ALBUMIN 4.1 02/11/2016 0817   AST 33 03/24/2020 0428   AST 20 02/11/2016 0817   ALT 52 (H) 03/24/2020 0428   ALT 32 02/11/2016 0817   ALKPHOS 64 03/24/2020 0428   ALKPHOS 116 02/11/2016 0817   BILITOT 0.5 03/24/2020 0428   BILITOT 0.59 02/11/2016 0817   GFRNONAA >60 03/24/2020 0428   GFRAA >60 03/24/2020 0428    Lipid Panel     Component Value Date/Time   CHOL 217 (H) 03/24/2020 0428   TRIG 136 03/24/2020 0428   HDL 82 03/24/2020 0428   CHOLHDL 2.6 03/24/2020 0428   VLDL 27 03/24/2020 0428   LDLCALC 108 (H) 03/24/2020  0428     Imaging I have reviewed the images obtained:  CT-scan of the brain Low density is seen involving the left basal ganglia and periventricular white matter most consistent with acute infarction  MRI examination of the brain Acute to subacute infarct involving the left basal ganglia and adjacent white matter. No hemorrhage. Chronic small right thalamic infarct   Assessment: 54 y.o. female with a past medical history of DM2, migraines, HTN, HLD, anxiety/depression, GERD, DCHF, hypothyroidism, nonalcoholic fatty liver disease, OSA and chronic pain who presents to the hospital with a 3 week history of lethargy and confusion (per chart, patient is slightly confused and a poor historian).  1. CT and MRI reveal a subacute left basal ganglia infarct   2. Exam reveals confusion without speech deficit. No definite focal motor weakness or sensory deficit is noted. 3. EKG: Sinus rhythm. Probable left ventricular  hypertrophy  4. Stroke risk factors: DM2, HTN, HLD, CHF and OSA 5. Was not on an antiplatelet medication prior to admission.  6. ESR and c-reactive protein are normal 7. Regarding her AMS, her TSH, RPR and B12 are normal. Ammonia is mildly elevated. Mild elevation of ALT with normal AST. No leukocytosis.  8. Has elevated total cholesterol and LDL  Recommendations: - HgbA1c - MRA of the brain without contrast - Frequent neuro checks - Bedside swallow screen  - Echocardiogram - Carotid dopplers - Start ASA 325 mg PO or 300 mg PR qd - Continue atorvastatin - BP management. Out of permissive HTN time window.  - Risk factor modification - Telemetry monitoring - PT consult, OT consult, Speech consult - Stroke team to follow  Beulah Gandy, NP  I have seen and examined the patient. I have formulated the assessment and recommendations. 54 year old female presenting with confusion. Exam reveals poor orientation without aphasia; no definite focal motor or sensory deficit. MRI reveals a subacute left basal ganglia ischemic infarction. Stroke work up and management as above.  Electronically signed: Dr. Kerney Elbe

## 2020-03-24 NOTE — Plan of Care (Signed)
Called for abnormal CTH finding. Pt with multiple days of confusion, with no focal weakness. CTH with subacute to chronic left BG stroke - new from 2014.  Recs: MRI brain w/o contrast, and call back for formal consult if acute to subacute stroke confirmed. Pt is not in any kind of treatment window for stroke. Also, toxic metabolic derangements management per primary team.  D/w Dr. Cyd Silence.  -- Amie Portland, MD Neurology

## 2020-03-24 NOTE — Evaluation (Signed)
Physical Therapy Evaluation Patient Details Name: Cheryl [name] MRN: [id number] DOB: Feb 10, 1966 Today's Date: 03/24/2020   History of Present Illness  54 yo female with onset of confusion and lethargy for 3 weeks was admitted to [facility].  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  PMHx:  DM, gastroparesis, asthma, HLD, HTN, fatty liver disease, osteoporosis, CHF, hypothyroidism, depression, GERD  Clinical Impression  Pt has mult medical changes including UTI, findings of stroke and AMS.  Her ability to safely navigate is limited and has limited help at home.  Will need rehab to strengthen and recover her safety and independence of gait to allow resumption of home and independence.  Will ask to default to SNF if pt cannot gain admission to CIR.  Follow acutely for strengthening, balance and safety education along with gait.  Monitor for her retention of instructions.    Follow Up Recommendations CIR    Equipment Recommendations  None recommended by PT    Recommendations for Other Services Rehab consult     Precautions / Restrictions Precautions Precautions: Fall Precaution Comments: incontinent but new per pt Restrictions Weight Bearing Restrictions: No      Mobility  Bed Mobility Overal bed mobility: Needs Assistance Bed Mobility: Supine to Sit     Supine to sit: Min assist     General bed mobility comments: min assist to lift trunk and support effort out to side of bed  Transfers Overall transfer level: Needs assistance Equipment used: Rolling walker (2 wheeled);1 person hand held assist Transfers: Sit to/from Stand Sit to Stand: Min assist         General transfer comment: min assist to power up at all heights  Ambulation/Gait Ambulation/Gait assistance: Min guard Gait Distance (Feet): 60 Feet (30 x 2) Assistive device: Rolling walker (2 wheeled);1 person hand held assist Gait Pattern/deviations: Step-to  pattern;Step-through pattern;Decreased stride length;Trunk flexed Gait velocity: reduced Gait velocity interpretation: <1.31 ft/sec, indicative of household ambulator General Gait Details: pt is shuffling and a bit weak to navigate on the walker, help to manage confined spaces  Stairs            Wheelchair Mobility    Modified Rankin (Stroke Patients Only)       Balance Overall balance assessment: Needs assistance   Sitting balance-Leahy Scale: Fair       Standing balance-Leahy Scale: Poor Standing balance comment: requires support of walker to steady or wall bar                             Pertinent Vitals/Pain Pain Assessment: No/denies pain    Home Living Family/patient expects to be discharged to:: Private residence Living Arrangements: Other relatives Available Help at Discharge: Family;Available 24 hours/day Type of Home: House Home Access: Level entry     Home Layout: One level Home Equipment: Walker - 2 wheels;Cane - single point Additional Comments: Pt has gaps in her history    Prior Function Level of Independence: Independent with assistive device(s)         Comments: was climbing stairs to enter house but has level entrance     Hand Dominance   Dominant Hand: Right    Extremity/Trunk Assessment   Upper Extremity Assessment Upper Extremity Assessment: Overall WFL for tasks assessed    Lower Extremity Assessment Lower Extremity Assessment: Generalized weakness    Cervical / Trunk Assessment Cervical / Trunk Assessment: Normal  Communication  Communication: No difficulties  Cognition Arousal/Alertness: Lethargic;Awake/alert Behavior During Therapy: Flat affect Overall Cognitive Status: Impaired/Different from baseline Area of Impairment: Orientation;Attention;Memory;Following commands;Safety/judgement;Awareness;Problem solving                 Orientation Level: Situation Current Attention Level:  Selective Memory: Decreased short-term memory Following Commands: Follows one step commands inconsistently;Follows one step commands with increased time Safety/Judgement: Decreased awareness of safety;Decreased awareness of deficits Awareness: Intellectual Problem Solving: Slow processing;Requires verbal cues General Comments: prompts needed to complete her tasks, did not ask for help when she was sitting wet on the bed      General Comments General comments (skin integrity, edema, etc.): pt is notably getting more steady as she walks, second walk more controlled and sat up in the chair for lunch    Exercises     Assessment/Plan    PT Assessment Patient needs continued PT services  PT Problem List Decreased strength;Decreased activity tolerance;Decreased balance;Decreased cognition;Decreased safety awareness;Decreased knowledge of use of DME;Cardiopulmonary status limiting activity       PT Treatment Interventions DME instruction;Gait training;Functional mobility training;Therapeutic activities;Therapeutic exercise;Balance training;Neuromuscular re-education;Patient/family education    PT Goals (Current goals can be found in the Care Plan section)  Acute Rehab PT Goals Patient Stated Goal: to get stronger and be safe PT Goal Formulation: With patient Time For Goal Achievement: 04/07/20 Potential to Achieve Goals: Good    Frequency Min 4X/week   Barriers to discharge Decreased caregiver support has family who can help but not as consistent group    Co-evaluation               AM-PAC PT "6 Clicks" Mobility  Outcome Measure Help needed turning from your back to your side while in a flat bed without using bedrails?: None Help needed moving from lying on your back to sitting on the side of a flat bed without using bedrails?: A Little Help needed moving to and from a bed to a chair (including a wheelchair)?: A Little Help needed standing up from a chair using your arms  (e.g., wheelchair or bedside chair)?: A Little Help needed to walk in hospital room?: A Little Help needed climbing 3-5 steps with a railing? : Total 6 Click Score: 17    End of Session Equipment Utilized During Treatment: Gait belt Activity Tolerance: Patient tolerated treatment well;Patient limited by fatigue;Treatment limited secondary to medical complications (Comment) Patient left: in chair;with call bell/phone within reach;with chair alarm set Nurse Communication: Mobility status PT Visit Diagnosis: Unsteadiness on feet (R26.81);Muscle weakness (generalized) (M62.81)    Time: 4492-0100 PT Time Calculation (min) (ACUTE ONLY): 43 min   Charges:   PT Evaluation $PT Eval Moderate Complexity: 1 Mod PT Treatments $Gait Training: 8-22 mins $Therapeutic Exercise: 8-22 mins       Ramond Dial 03/24/2020, 4:42 PM  Mee Hives, PT MS Acute Rehab Dept. Number: [facility]

## 2020-03-24 NOTE — H&P (Signed)
History and Physical    Cheryl [name] RXV:[id number] DOB: 1966-06-10 DOA: 03/23/2020  PCP: Aurea Graff, PA-C (Inactive)  Patient coming from: Home   Chief Complaint:  Chief Complaint  Patient presents with  . Altered Mental Status     HPI:    54 year old female with past medical history of IDDM type II, diabetic gastroparesis, asthma, gastroesophageal reflux disease, hyperlipidemia, hypertension, nonalcoholic fatty liver disease, osteoporosis, diastolic congestive heart failure (EF during cath 07/2017 65%) hypothyroidism who presents to [facility] emergency department due to a 3-week history of lethargy and confusion.  Patient is an extremely poor historian due to ongoing confusion.  I have attempted to call and speak to the daughter Salomon Mast [phone]) whom the patient lives with but unfortunately she did not answer the phone.  The majority of the history that I have obtained has been from discussions and review of notes from the emergency department staff.  Patient has been exhibiting persisting lethargy and confusion for a period of approximately 3 weeks.  Several days ago on Sunday the patient also experienced a fall without loss of consciousness or head trauma.  There has been no recent new onset of shortness of breath, nausea, vomiting, diarrhea, cough.  There has been no recent contact with anyone with confirmed COVID-19 infection.  Due to patient's ongoing lethargy and confusion the patient eventually presented to [place] emergency department for evaluation.  Upon evaluation in the emergency department patient was found to be in mild diabetic ketoacidosis and therefore was initiated on insulin infusion.  Furthermore, basic work-up for patient's confusion included CT imaging of the head without contrast which revealed a left basal ganglia infarct with periventricular white matter ischemic changes concerning for stroke.  Hospitalist group was contacted at  [facility] for acceptance to the medical floor for continued work-up and treatment.  Upon arrival to the medical floor at [facility] patient continues to be lethargic and unable to provide a history.   Review of Systems: Unable to perform due to lethargy and confusion.  Past Medical History:  Diagnosis Date  . Allergy   . Anxiety   . Asthma   . Cancer ([place])    melanoma 2015 upper Left arm   . Chronic airway obstruction, not elsewhere classified   . Chronic pain    "q where; herniated disc in my tailbone" (02/09/2013)  . DDD (degenerative disc disease)    CERVIAL AND LUMBAR  . Depression   . Edema   . Fatty liver disease, nonalcoholic   . Fibromyalgia    "severe" (02/09/2013)  . Gastroparesis    from DM and chronic narcotic use  . GERD (gastroesophageal reflux disease)   . Human parvovirus infection   . Hyperlipidemia   . Hypertension   . Hypothyroidism   . Interstitial cystitis   . Migraine headache    "weekly; worse lately" (02/09/2013)  . OSA (obstructive sleep apnea)    "have mask;; don't use it; no one came out to check it" (02/09/2013)  . Osteoporosis   . Peripheral neuropathy    "severe" (02/09/2013)  . 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 PONV (postoperative nausea and vomiting)   . Positive PPD   . Shortness of breath    "at anytime; it's gotten worse recently" (02/09/2013)  . Sleep apnea   . Type II diabetes mellitus ([place])   . Vitamin D deficiency   . Vocal cord  dysfunction     Past Surgical History:  Procedure Laterality Date  . ABDOMINAL HYSTERECTOMY  1993  . ANTERIOR CERVICAL DECOMP/DISCECTOMY FUSION  2004  . arm surgery     left 2018  . BRONCHOSCOPY  06/28/2019  . CARPAL TUNNEL RELEASE Bilateral ?1980's  . CHOLECYSTECTOMY  ?1990  . COLONOSCOPY    . HIP SURGERY Right 2002   "did something to the tibula band" (02/09/2013)  . KNEE ARTHROSCOPY Left 1990's   "fell; clot behind knee  cap had to be removed" (02/09/2013)  . NASAL SINUS SURGERY  1986; 1990's   "i've had 3" (02/09/2013)  . RIGHT/LEFT HEART CATH AND CORONARY ANGIOGRAPHY N/A 08/26/2017   Procedure: RIGHT/LEFT HEART CATH AND CORONARY ANGIOGRAPHY;  Surgeon: Belva Crome, MD;  Location: [place] [facility];  Service: Cardiovascular;  Laterality: N/A;  . SHOULDER ARTHROSCOPY W/ ROTATOR CUFF REPAIR Right    "had to go deep in rotator cuff" (02/09/2013)  . VIDEO BRONCHOSCOPY Bilateral 06/28/2019   Procedure: VIDEO BRONCHOSCOPY WITH FLUORO;  Surgeon: Marshell Garfinkel, MD;  Location: [place];  Service: Cardiopulmonary;  Laterality: Bilateral;     reports that she quit smoking about 11 years ago. Her smoking use included cigarettes. She has a 0.30 pack-year smoking history. She has never used smokeless tobacco. She reports that she does not drink alcohol and does not use drugs.  Allergies  Allergen Reactions  . Influenza Vaccines Anaphylaxis and Swelling    Throat swelling, "flu" symptoms  . Iodinated Diagnostic Agents Shortness Of Breath, Nausea And Vomiting, Nausea Only and Rash  . Iodine-131 Shortness Of Breath, Nausea And Vomiting and Rash  . Iohexol Anaphylaxis, Hives and Swelling     Desc: hives,dyspnea; throat swelling; ok w/ premeds and omnipaque   . Levofloxacin Anaphylaxis and Hives  . Nucynta [Tapentadol Hydrochloride] Other (See Comments)    Hallucinations and  insomnia x3 days  . Pregabalin Other (See Comments)    (lyrica)Reaction-Syncope & unable to speak  . Sulfonamide Derivatives Anaphylaxis  . Vantin [Cefpodoxime] Anaphylaxis, Hives, Rash and Cough  . Vilazodone Hcl Other (See Comments)    (Viibryd) Hallucinations  . Amoxicillin Hives and Itching    Did it involve swelling of the face/tongue/throat, SOB, or low BP? No Did it involve sudden or severe rash/hives, skin peeling, or any reaction on the inside of your mouth or nose? Yes Did you need to seek medical attention at a hospital or  doctor's office? No When did it last happen?3-4 years ago If all above answers are "NO", may proceed with cephalosporin use. .  . Biaxin [Clarithromycin] Hives  . Carbamazepine Itching    (Tegretol)  . Ciprofloxacin Hives  . Dilaudid [Hydromorphone Hcl] Itching and Rash    redness  . Iodides Nausea Only  . Percocet [Oxycodone-Acetaminophen] Rash    Family History  Problem Relation Age of Onset  . Coronary artery disease Father   . Diabetes Brother   . Diabetes Brother   . Breast cancer Maternal Aunt   . Breast cancer Cousin   . Breast cancer Cousin   . Colon cancer Neg Hx   . Esophageal cancer Neg Hx   . Liver cancer Neg Hx   . Pancreatic cancer Neg Hx   . Rectal cancer Neg Hx   . Stomach cancer Neg Hx      Prior to Admission medications   Medication Sig Start Date End Date Taking? Authorizing Provider  albuterol (VENTOLIN HFA) 108 (90 Base) MCG/ACT inhaler INHALE 2 PUFFS EVERY  6 HOURS AS NEEDED FOR WHEEZING OR SHORTNESS OF BREATH. 12/06/19   Baird Lyons D, MD  atorvastatin (LIPITOR) 20 MG tablet Take 20 mg by mouth daily. 06/10/19   [provider]  Cholecalciferol (VITAMIN D3) 1000 units CAPS Take 1,000 Units by mouth daily.    [provider]  diazepam (VALIUM) 10 MG tablet Take 10 mg by mouth 2 (two) times daily as needed (panic attacks.).  01/05/19   [provider]  diltiazem (CARDIZEM CD) 240 MG 24 hr capsule TAKE 1 CAPSULE BY MOUTH EVERY MORNING ON AN EMPTY STOMACH - [name] DENIED, FAXED DR Johnsie Cancel 10/22/15 SS Patient taking differently: Take 240 mg by mouth daily.  10/22/15   Josue Hector, MD  FLUoxetine (PROZAC) 40 MG capsule Take 80 mg by mouth at bedtime.  12/30/18   [provider]  Fluticasone-Umeclidin-Vilant (TRELEGY ELLIPTA) 100-62.5-25 MCG/INH AEPB Inhale 1 puff into the lungs daily. 07/28/19   Baird Lyons D, MD  gabapentin (NEURONTIN) 300 MG capsule Take 600 mg by mouth at bedtime.     [provider]   HUMULIN R U-500 KWIKPEN 500 UNIT/ML kwikpen Inject 100 Units into the skin daily.  02/26/16   [provider]  hydrochlorothiazide (HYDRODIURIL) 25 MG tablet Take 25 mg by mouth daily.    [provider]  hydrOXYzine (ATARAX/VISTARIL) 25 MG tablet Take 50 mg by mouth at bedtime. 06/08/19   [provider]  ibuprofen (ADVIL,MOTRIN) 800 MG tablet Take 800 mg by mouth every 8 (eight) hours as needed (pain).     [provider]  ipratropium-albuterol (DUONEB) 0.5-2.5 (3) MG/3ML SOLN Take 43m by nebulization every 4-6 hours if needed Patient taking differently: Inhale 3 mLs into the lungs every 4 (four) hours as needed (wheezing/shortness of breath.).  01/05/17   YBaird LyonsD, MD  levothyroxine (SYNTHROID, LEVOTHROID) 75 MCG tablet Take 75 mcg by mouth daily before breakfast.    [provider]  liraglutide (VICTOZA) 18 MG/3ML SOPN Inject 1.2 mg into the skin every evening.     [provider]  metoCLOPramide (REGLAN) 5 MG tablet Take 1 tablet (5 mg total) by mouth 4 (four) times daily. 12/01/19 12/31/19  Zehr, JLaban Emperor PA-C  nebivolol (BYSTOLIC) 10 MG tablet Take 1 tablet (10 mg total) by mouth daily. 02/15/20   NJosue Hector MD  nystatin-triamcinolone (MYCOLOG II) cream Apply 1 application topically 2 (two) times daily as needed (yeast in skin folds).  02/25/19   [provider]  omeprazole (PRILOSEC) 40 MG capsule Take 1 capsule (40 mg total) by mouth 2 (two) times daily. MUST HAVE OFFICE VISIT FOR FURTHER REFILLS Patient taking differently: Take 40 mg by mouth daily before breakfast.  01/19/19   SLadene Artist MD  ondansetron (ZOFRAN ODT) 4 MG disintegrating tablet Take 1 tablet (4 mg total) by mouth every 6 (six) hours as needed for nausea or vomiting. 10/11/19   Zehr, JLaban Emperor PA-C  pantoprazole (PROTONIX) 40 MG tablet Take 1 tablet (40 mg total) by mouth 2 (two) times daily. 10/11/19   Zehr, JLaban Emperor PA-C  pentosan polysulfate  (ELMIRON) 100 MG capsule Take 100 mg by mouth 3 (three) times daily.    [provider]  SUMAtriptan (IMITREX) 20 MG/ACT nasal spray Place 20 mg into the nose every 2 (two) hours as needed for migraine or headache. May repeat in 2 hours if headache persists or recurs.    [provider]  tiZANidine (ZANAFLEX) 4 MG tablet Take  4 mg by mouth 3 (three) times daily. May hold one dose if needed to operate motor vehicle. 12/30/18   [provider]    Physical Exam: Vitals:   03/24/20 0000 03/24/20 0030 03/24/20 0142 03/24/20 0353  BP: 131/73 (!) 141/71 (!) 151/66 (!) 122/58  Pulse: (!) 58 (!) 54 (!) 53 (!) 56  Resp: 11 13 12 11   Temp:   98.5 F (36.9 C) 98.3 F (36.8 C)  TempSrc:   Oral Oral  SpO2: 98% 98% 100% 95%  Weight:   95.2 kg   Height:   5' 2"  (1.575 m)     Constitutional: Lethargic but arousable and oriented x2, patient is currently not in any acute distress.   Skin: no rashes, no lesions, good skin turgor noted.  Eyes: Pupils are equally reactive to light.  No evidence of scleral icterus or conjunctival pallor.  ENMT: Moist mucous membranes noted.  Posterior pharynx clear of any exudate or lesions.   Neck: normal, supple, no masses, no thyromegaly.  No evidence of jugular venous distension.   Respiratory: clear to auscultation bilaterally, no wheezing, no crackles. Normal respiratory effort. No accessory muscle use.  Cardiovascular: Regular rate and rhythm, no murmurs / rubs / gallops. No extremity edema. 2+ pedal pulses. No carotid bruits.  Chest:   Nontender without crepitus or deformity.   Back:   Nontender without crepitus or deformity. Abdomen: Abdomen is soft and nontender.  No evidence of intra-abdominal masses.  Positive bowel sounds noted in all quadrants.   Musculoskeletal: No joint deformity upper and lower extremities. Good ROM, no contractures. Normal muscle tone.  Neurologic: Patient is lethargic but arousable.  Patient is only intermittently  following commands.  Patient is able to move all 4 extremities spontaneously.  Patient sensation is seemingly grossly intact.  Patient does respond to verbal stimuli.   Psychiatric: Depressed mood with flat affect, patient currently does not seem to possess insight as to her current situation.   Labs on Admission: I have personally reviewed following labs and imaging studies -   CBC: Recent Labs  Lab 03/23/20 1605 03/23/20 1746  WBC 14.2*  --   NEUTROABS 6.9  --   HGB 13.0 13.6  HCT 39.3 40.0  MCV 86.6  --   PLT 372  --    Basic Metabolic Panel: Recent Labs  Lab 03/23/20 1605 03/23/20 1746 03/23/20 2033  NA 131* 133* 133*  K 3.7 3.8 3.2*  CL 97*  --  101  CO2 18*  --  22  GLUCOSE 484*  --  191*  BUN 36*  --  28*  CREATININE 1.44*  --  0.86  CALCIUM 9.1  --  9.0   GFR: Estimated Creatinine Clearance: 81.3 mL/min (by C-G formula based on SCr of 0.86 mg/dL). Liver Function Tests: Recent Labs  Lab 03/23/20 1605 03/23/20 2033  AST 50* 37  ALT 62* 55*  ALKPHOS 71 65  BILITOT 0.6 0.5  PROT 7.4 7.1  ALBUMIN 4.2 3.9   No results for input(s): LIPASE, AMYLASE in the last 168 hours. No results for input(s): AMMONIA in the last 168 hours. Coagulation Profile: No results for input(s): INR, PROTIME in the last 168 hours. Cardiac Enzymes: No results for input(s): CKTOTAL, CKMB, CKMBINDEX, TROPONINI in the last 168 hours. BNP (last 3 results) No results for input(s): PROBNP in the last 8760 hours. HbA1C: No results for input(s): HGBA1C in the last 72 hours. CBG: Recent Labs  Lab 03/23/20 2308 03/24/20 0012 03/24/20  0141 03/24/20 0250 03/24/20 0349  GLUCAP 196* 200* 154* 189* 171*   Lipid Profile: No results for input(s): CHOL, HDL, LDLCALC, TRIG, CHOLHDL, LDLDIRECT in the last 72 hours. Thyroid Function Tests: No results for input(s): TSH, T4TOTAL, FREET4, T3FREE, THYROIDAB in the last 72 hours. Anemia Panel: No results for input(s): VITAMINB12, FOLATE,  FERRITIN, TIBC, IRON, RETICCTPCT in the last 72 hours. Urine analysis:    Component Value Date/Time   COLORURINE YELLOW 03/23/2020 1607   APPEARANCEUR HAZY (A) 03/23/2020 1607   LABSPEC 1.025 03/23/2020 1607   PHURINE 6.0 03/23/2020 1607   GLUCOSEU >=500 (A) 03/23/2020 1607   GLUCOSEU NEGATIVE 06/06/2019 1136   HGBUR NEGATIVE 03/23/2020 1607   BILIRUBINUR NEGATIVE 03/23/2020 1607   KETONESUR NEGATIVE 03/23/2020 1607   PROTEINUR NEGATIVE 03/23/2020 1607   UROBILINOGEN 0.2 06/06/2019 1136   NITRITE NEGATIVE 03/23/2020 1607   LEUKOCYTESUR TRACE (A) 03/23/2020 1607    Radiological Exams on Admission - Personally Reviewed: CT Head Wo Contrast  Result Date: 03/23/2020 CLINICAL DATA:  Delirium. EXAM: CT HEAD WITHOUT CONTRAST TECHNIQUE: Contiguous axial images were obtained from the base of the skull through the vertex without intravenous contrast. COMPARISON:  June 17, 2018. FINDINGS: Brain: Low density is seen involving the left basal ganglia and periventricular white matter most consistent with acute infarction. Ventricular size is within normal limits. No midline shift is noted. No definite hemorrhage or mass lesion is noted. Vascular: No hyperdense vessel or unexpected calcification. Skull: Normal. Negative for fracture or focal lesion. Sinuses/Orbits: No acute finding. Other: None. IMPRESSION: Low density is seen involving the left basal ganglia and periventricular white matter most consistent with acute infarction. MRI is recommended for further evaluation. Electronically Signed   By: Marijo Conception M.D.   On: 03/23/2020 16:50   DG Chest Port 1 View  Result Date: 03/23/2020 CLINICAL DATA:  Altered mental status for several weeks, possible DKA EXAM: PORTABLE CHEST 1 VIEW COMPARISON:  10/24/2018 FINDINGS: The heart size and mediastinal contours are within normal limits. Both lungs are clear. The visualized skeletal structures are unremarkable. IMPRESSION: No active disease. Electronically  Signed   By: Inez Catalina M.D.   On: 03/23/2020 17:43    EKG: Personally reviewed.  Rhythm is normal sinus rhythm with heart rate of 60 bpm.  No dynamic ST segment changes appreciated.  Assessment/Plan Principal Problem:   Acute metabolic encephalopathy   Patient presenting with several weeks of persistent confusion  While CT imaging of the head does reveal evidence of a left basal ganglia infarct I am not convinced that this could be the cause of patient's lethargy and confusion.  Obtaining TSH, folate, vitamin B12, ammonia and toxicology screen  Temporarily holding any sedative medications from patient's home medication list.  Hydrating patient with intravenous fluids.  While the emergency department provider was concerned of a potential complicated urinary tract infection contributing to patient's confusion, upon my review of the urinalysis I am not convinced that the patient is truly suffering from a UTI with the absence of nitrites and no white blood cells seen on microscopy.  Patient continues to exhibit substantial confusion  Supportive care at this time.  Active Problems:   DKA, type 2 [facility])   Patient presented to [place] in mild diabetic ketoacidosis  Patient was initiated on intravenous infusion and medicine [place] and arise at [facility] stepdown unit with continued insulin therapy.  Patient continues to exhibit substantial confusion which will likely maintain that ongoing oral intake  will be tenuous.  Considering patient is supposed to be on scheduled dosing of U500, until patient is mentating well enough to tolerate oral intake along with initiating subcutaneous basal insulin again will continue insulin infusion until later in the morning.    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 Malen Gauze with neurology who questions whether this is truly an acute stroke  Dr. Malen Gauze recommends follow-up with MRI brain without contrast.  If this indeed does reveal an acute stroke he asks that we formally consult the neurology team in the morning and then proceed with stroke work-up including echocardiography, neurologic checks and CT angiogram of the head neck.    Essential hypertension   Continuing home regimen of antihypertensive therapy    Hypothyroidism   Continue home regimen of Synthroid  Obtaining TSH    Asthma not dependent on systemic steroids without complication   Continue home regimen of maintenance inhalers  Patient is currently not suffering from an exacerbation    GERD without esophagitis   Continue home regimen of PPI    Mixed diabetic hyperlipidemia associated with type 2 diabetes mellitus ([place])    Continue home regimen of statin therapy   Code Status:  Full code Family Communication: Patient's daughter [name] can be contacted at [phone].  I have attempted to call the daughter at this number and she did not answer.  Status is: Inpatient  Remains inpatient appropriate because:Ongoing diagnostic testing needed not appropriate for outpatient work up, IV treatments appropriate due to intensity of illness or inability to take PO and Inpatient level of care appropriate due to severity of illness   Dispo: The patient is from: Home              Anticipated d/c is to: Home              Anticipated d/c date is: 3 days              Patient currently is not medically stable to d/c.        Vernelle Emerald MD [facility] Pager [phone]  If 7PM-7AM, please contact night-coverage [email or web address] Use universal  password for that web site. If you do not have the password, please call the hospital operator.  03/24/2020, 4:22 AM

## 2020-03-24 NOTE — Progress Notes (Signed)
  PROGRESS NOTE  Patient admitted earlier this morning. See H&P.   Cheryl [name] is a 54 year old female with past medical history of IDDM type II, diabetic gastroparesis, asthma, gastroesophageal reflux disease, hyperlipidemia, hypertension, nonalcoholic fatty liver disease, osteoporosis, diastolic congestive heart failure (EF during cath 07/2017 65%), hypothyroidism who presents to [facility] emergency department due to a 3-week history of lethargy and confusion. Upon evaluation in the emergency department, patient was found to be in mild diabetic ketoacidosis and therefore was initiated on insulin infusion.  Furthermore, basic work-up for patient's confusion included CT imaging of the head without contrast which revealed a left basal ganglia infarct with periventricular white matter ischemic changes concerning for stroke.  Hospitalist group was contacted at [facility] for acceptance to the medical floor for continued work-up and treatment.  Patient alert, awake and oriented to self, place, month.  She has no physical complaints on examination.  A/P:  Acute metabolic encephalopathy -Likely secondary to DKA -UDS positive for benzo -Improved since admission  DKA -Anion gap closed yesterday evening -Lantus, NovoLog and NovoLog sliding scale insulin ordered.  Transition off IV insulin -Diabetic coordinator consulted  Hypokalemia -Replace, trend  Concern for stroke -CT head revealed low density is seen involving the left basal ganglia and periventricular white matter most consistent with acute infarction. -Neurology recommended MRI which is pending at this time -PT OT SLP   Essential hypertension -Continue Cardizem, Bystolic  Hypothyroidism -Continue Synthroid  Hyperlipidemia -Continue Lipitor  GERD -Continue PPI  Depression -Continue Prozac   Dessa Phi, DO [facility] 03/24/2020, 11:29 AM  Available via Epic secure chat 7am-7pm After these  hours, please refer to coverage provider listed on [email or web address]

## 2020-03-25 ENCOUNTER — Inpatient Hospital Stay (HOSPITAL_COMMUNITY): Payer: Medicare HMO

## 2020-03-25 DIAGNOSIS — I6389 Other cerebral infarction: Secondary | ICD-10-CM

## 2020-03-25 LAB — HEMOGLOBIN A1C
Hgb A1c MFr Bld: 7.6 % — ABNORMAL HIGH (ref 4.8–5.6)
Mean Plasma Glucose: 171.42 mg/dL

## 2020-03-25 LAB — BASIC METABOLIC PANEL
Anion gap: 11 (ref 5–15)
BUN: 12 mg/dL (ref 6–20)
CO2: 22 mmol/L (ref 22–32)
Calcium: 9.5 mg/dL (ref 8.9–10.3)
Chloride: 104 mmol/L (ref 98–111)
Creatinine, Ser: 0.71 mg/dL (ref 0.44–1.00)
GFR calc Af Amer: >60 mL/min (ref >60)
GFR calc non Af Amer: >60 mL/min (ref >60)
Glucose, Bld: 264 mg/dL — ABNORMAL HIGH (ref 70–99)
Potassium: 3.6 mmol/L (ref 3.5–5.1)
Sodium: 137 mmol/L (ref 135–145)

## 2020-03-25 LAB — MAGNESIUM: Magnesium: 1.7 mg/dL (ref 1.7–2.4)

## 2020-03-25 LAB — GLUCOSE, CAPILLARY
Glucose-Capillary: 284 mg/dL — ABNORMAL HIGH (ref 70–99)
Glucose-Capillary: 289 mg/dL — ABNORMAL HIGH (ref 70–99)
Glucose-Capillary: 286 mg/dL — ABNORMAL HIGH (ref 70–99)
Glucose-Capillary: 332 mg/dL — ABNORMAL HIGH (ref 70–99)
Glucose-Capillary: 218 mg/dL — ABNORMAL HIGH (ref 70–99)
Glucose-Capillary: 276 mg/dL — ABNORMAL HIGH (ref 70–99)

## 2020-03-25 LAB — ECHOCARDIOGRAM COMPLETE
Area-P 1/2: 4.6 cm2
Calc EF: 59.2 %
Height: 62 in
S' Lateral: 2.6 cm
Single Plane A2C EF: 59.9 %
Single Plane A4C EF: 56 %
Weight: 3358.05 oz

## 2020-03-25 MED ORDER — ATORVASTATIN CALCIUM 80 MG PO TABS
80.0000 mg | ORAL_TABLET | Freq: Every day | ORAL | Status: DC
  Administered 2020-03-25 – 2020-03-29 (×5): 80 mg via ORAL
  Filled 2020-03-25 (×5): qty 1

## 2020-03-25 MED ORDER — INSULIN GLARGINE 100 UNIT/ML ~~LOC~~ SOLN
50.0000 [IU] | Freq: Every day | SUBCUTANEOUS | Status: DC
  Administered 2020-03-25 – 2020-03-26 (×2): 50 [IU] via SUBCUTANEOUS
  Filled 2020-03-25 (×2): qty 0.5

## 2020-03-25 MED ORDER — ASPIRIN EC 325 MG PO TBEC
325.0000 mg | DELAYED_RELEASE_TABLET | Freq: Every day | ORAL | Status: DC
  Administered 2020-03-25 – 2020-03-26 (×2): 325 mg via ORAL
  Filled 2020-03-25 (×2): qty 1

## 2020-03-25 MED ORDER — INSULIN ASPART 100 UNIT/ML ~~LOC~~ SOLN
5.0000 [IU] | Freq: Three times a day (TID) | SUBCUTANEOUS | Status: DC
  Administered 2020-03-25 – 2020-03-26 (×5): 5 [IU] via SUBCUTANEOUS

## 2020-03-25 MED ORDER — POTASSIUM CHLORIDE CRYS ER 20 MEQ PO TBCR
40.0000 meq | EXTENDED_RELEASE_TABLET | Freq: Once | ORAL | Status: AC
  Administered 2020-03-25: 40 meq via ORAL
  Filled 2020-03-25: qty 2

## 2020-03-25 NOTE — Progress Notes (Signed)
OT Evaluation  This 54 y/o female presents with the below, reports being mod independent with ADL and mobility PTA. Pt pleasant and willing to participate in therapy session, presenting with impaired cognition, weakness, decreased standing balance impacting her functional performance. Pt requiring consistent minA for mobility tasks using RW given unsteadiness, tolerating short distance mobility in room. She requires up to [facility] for LB and toileting ADL. Pt with notable delay in processing/response time to all questions/instruction. Monitored BP with activity as RN reports pt previously reporting dizziness (see below). Pt to benefit from continued acute OT services and currently feel she is appropriate for CIR level therapies at time of discharge to progress pt towards her PLOF. Will follow.   BP start of session 119/75 (82) Seated EOB: 128/67 (86) Post mobility to sink: 100/77 (85) (denies dizziness) End of session seated in recliner: 120/77 (91)  Pt denies dizziness throughout.     03/25/20 1145  OT Visit Information  Last OT Received On 03/25/20  Assistance Needed +1  History of Present Illness 54 yo female with onset of confusion and lethargy for 3 weeks was admitted to [place].  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  PMHx:  DM, gastroparesis, asthma, HLD, HTN, fatty liver disease, osteoporosis, CHF, hypothyroidism, depression, GERD  Precautions  Precautions Fall  Precaution Comments hx of incontinence this admission  Restrictions  Weight Bearing Restrictions No  Home Living  Family/patient expects to be discharged to: Private residence  Living Arrangements Other relatives  Available Help at Discharge Family;Available 24 hours/day  Type of Home House  Home Access Level entry  Home Layout One level  Bathroom Shower/Tub Tub/shower unit  Corporate treasurer No  Home Equipment Walker - 2 wheels;Cane - single  point  Prior Function  Level of Independence Independent with assistive device(s)  Communication  Communication No difficulties  Pain Assessment  Pain Assessment No/denies pain  Cognition  Arousal/Alertness Lethargic;Awake/alert  Behavior During Therapy Flat affect  Overall Cognitive Status Impaired/Different from baseline  Area of Impairment Orientation;Attention;Memory;Following commands;Safety/judgement;Awareness;Problem solving  Orientation Level Situation  Current Attention Level Selective  Memory Decreased short-term memory  Following Commands Follows one step commands inconsistently;Follows one step commands with increased time  Safety/Judgement Decreased awareness of safety;Decreased awareness of deficits  Awareness Intellectual  Problem Solving Slow processing;Requires verbal cues;Decreased initiation;Requires tactile cues  General Comments increased time to respond to questions/commands   Upper Extremity Assessment  Upper Extremity Assessment Generalized weakness  Lower Extremity Assessment  Lower Extremity Assessment Defer to PT evaluation  Cervical / Trunk Assessment  Cervical / Trunk Assessment Normal  ADL  Overall ADL's  Needs assistance/impaired  Eating/Feeding Set up;Sitting  Grooming Min guard;Sitting;Wash/dry face  Upper Body Bathing Sitting;Minimal assistance  Lower Body Bathing Moderate assistance;Sit to/from stand  Lower Body Bathing Details (indicate cue type and reason) assist for posterior portion, pt able to perform part of bathing task in sitting   Upper Body Dressing  Minimal assistance;Sitting  Upper Body Dressing Details (indicate cue type and reason) donning new gown  Lower Body Dressing Moderate assistance;Sit to/from stand  Lower Body Dressing Details (indicate cue type and reason) pt able to don socks seated EOB, able to thread LEs into mesh underwear but requiring assist to advance over hips, assist for standing balance   Toilet Transfer Minimal  assistance;Ambulation;RW  Toileting- Clothing Manipulation and Hygiene Moderate assistance;Sitting/lateral lean;Sit to/from stand  Toileting - Clothing Manipulation Details (indicate cue type and reason) pt  with leaking purewick start of session, assisted with pericare/changing into dry briefs  Functional mobility during ADLs Minimal assistance;Rolling walker  Bed Mobility  Overal bed mobility Needs Assistance  Bed Mobility Supine to Sit  Supine to sit Min assist  General bed mobility comments to steady initially  Transfers  Overall transfer level Needs assistance  Equipment used Rolling walker (2 wheeled);1 person hand held assist  Transfers Sit to/from Stand  Sit to Stand Min assist  General transfer comment pt requiring steadying assist throughout, often requiring multiple attempts to achieve full standing  balance, VCs for safe hand placmeent, stood from EOB, BSC in front of sink and from recliner   Balance  Overall balance assessment Needs assistance  Sitting balance-Leahy Scale Fair  Standing balance-Leahy Scale Poor  Standing balance comment reliant on external assist   OT - End of Session  Equipment Utilized During Treatment Gait belt;Rolling walker  Activity Tolerance Patient tolerated treatment well  Patient left in chair;with call bell/phone within reach;with chair alarm set  Nurse Communication Mobility status (needs new purewick)  OT Assessment  OT Recommendation/Assessment Patient needs continued OT Services  OT Visit Diagnosis Unsteadiness on feet (R26.81);Muscle weakness (generalized) (M62.81);Other symptoms and signs involving cognitive function  OT Problem List Decreased strength;Decreased range of motion;Decreased activity tolerance;Impaired balance (sitting and/or standing);Decreased cognition;Decreased safety awareness;Decreased knowledge of use of DME or AE;Obesity;Decreased knowledge of precautions  OT Plan  OT Frequency (ACUTE ONLY) Min 2X/week  OT  Treatment/Interventions (ACUTE ONLY) Self-care/ADL training;Therapeutic exercise;Energy conservation;DME and/or AE instruction;Therapeutic activities;Cognitive remediation/compensation;Patient/family education;Balance training  AM-PAC OT "6 Clicks" Daily Activity Outcome Measure (Version 2)  Help from another person eating meals? 3  Help from another person taking care of personal grooming? 3  Help from another person toileting, which includes using toliet, bedpan, or urinal? 2  Help from another person bathing (including washing, rinsing, drying)? 2  Help from another person to put on and taking off regular upper body clothing? 3  Help from another person to put on and taking off regular lower body clothing? 2  6 Click Score 15  OT Recommendation  Recommendations for Other Services Rehab consult  Follow Up Recommendations CIR  OT Equipment Tub/shower seat  Individuals Consulted  Consulted and Agree with Results and Recommendations Patient  Acute Rehab OT Goals  Patient Stated Goal to get well enough to play with her granddaughter   OT Goal Formulation With patient  Time For Goal Achievement 04/08/20  Potential to Achieve Goals Good  OT Time Calculation  OT Start Time (ACUTE ONLY) 0937  OT Stop Time (ACUTE ONLY) 1010  OT Time Calculation (min) 33 min  OT General Charges  $OT Visit 1 Visit  OT Evaluation  $OT Eval Moderate Complexity 1 Mod  OT Treatments  $Self Care/Home Management  8-22 mins  Written Expression  Dominant Hand Right   Lou Cal, OT Acute Rehabilitation Services Pager [phone] Office [phone]

## 2020-03-25 NOTE — Progress Notes (Signed)
Inpatient Rehab Admissions Coordinator Note:   Per PT recommendation, pt was screened for CIR candidacy by Gayland Curry, MS, CCC-SLP.  At this time we are not recommending an Inpatient Rehab consult.  Noted in Hospitalist note, daughter declined CIR d/c recommendation.  Please consult if changes occur.  Please contact me with questions.    Gayland Curry, [place], [place] Admissions Coordinator [phone] 03/25/20 10:29 AM

## 2020-03-25 NOTE — Progress Notes (Signed)
PROGRESS NOTE    Cheryl [name]  NWG:[id number] DOB: [birth year] DOA: 03/23/2020 PCP: Aurea Graff, PA-C (Inactive)     Brief Narrative:  Cheryl [name] is a 54 year old female with past medical history of IDDM type II, diabetic gastroparesis, asthma, gastroesophageal reflux disease, hyperlipidemia, hypertension, nonalcoholic fatty liver disease, osteoporosis, diastolic congestive heart failure(EF during cath 07/2017 65%),hypothyroidism who presents to [facility] emergency department due to a 3-week history of lethargy and confusion. Upon evaluation in the emergency department, patient was found to be in mild diabetic ketoacidosis and therefore was initiated on insulin infusion. Furthermore, basic work-up for patient's confusion included CT imaging of the head without contrast which revealed a left basal ganglia infarct with periventricular white matter ischemic changes concerning for stroke. Hospitalist group was contacted at [facility] for acceptance to the medical floor for continued work-up and treatment.  DKA resolved.  Further imaging with MRI confirmed an acute/subacute stroke.  Neurology was consulted.  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 -MRI brain revealed acute to subacute infarct involving the left basal ganglia and adjacent white matter -Neurology following -PT recommending CIR versus SNF.  Discussed over the phone with daughter, due to ongoing Covid pandemic, she feels that patient would do better at home with home health physical therapy.  TOC consulted -Echocardiogram pending -Aspirin, lipitor   Hypokalemia -Replace, trend  Essential hypertension -Continue Cardizem, Bystolic  Hypothyroidism -Continue Synthroid  Hyperlipidemia -Continue Lipitor  GERD -Continue PPI  Depression -Continue Prozac   DVT prophylaxis:  enoxaparin (LOVENOX) injection 40 mg Start: 03/24/20 1000  Code Status: Full Family Communication: Discussed with daughter over the phone 8/29  Disposition Plan:   Status is: Inpatient  Remains inpatient appropriate because:Ongoing diagnostic testing needed not appropriate for outpatient work up and Unsafe d/c plan   Dispo: The patient is from: Home              Anticipated d/c is to: Home              Anticipated d/c date is: 1 day              Patient currently is not medically stable to d/c. Pending neurology clearance for discharge, continue blood sugar control, home health on discharge (daughter declined CIR/SNF recommendation)   Consultants:   Neurology   Antimicrobials:  Anti-infectives (From admission, onward)   Start     Dose/Rate Route Frequency Ordered Stop   03/23/20 1715  fosfomycin (MONUROL) packet 3 g        3 g Oral  Once 03/23/20 1714 03/23/20 1747        Objective: Vitals:  03/24/20 2100 03/24/20 2354 03/25/20 0343 03/25/20 0753  BP:  137/65 (!) 109/59 (!) 122/58  Pulse: 69 65 63 67  Resp: (!) 23 13 19 18   Temp:  98.6 F (37 C) 98.4 F (36.9 C) 97.8 F (36.6 C)  TempSrc:  Oral Oral Oral   SpO2: 95% 94% 92% 93%  Weight:      Height:        Intake/Output Summary (Last 24 hours) at 03/25/2020 1011 Last data filed at 03/25/2020 7824 Gross per 24 hour  Intake --  Output 1700 ml  Net -1700 ml   Filed Weights   03/23/20 1558 03/24/20 0142  Weight: 90.7 kg 95.2 kg    Examination:  General exam: Appears calm and comfortable  Respiratory system: Clear to auscultation. Respiratory effort normal. No respiratory distress. No conversational dyspnea.  Cardiovascular system: S1 & S2 heard, RRR. No murmurs. No pedal edema. Gastrointestinal system: Abdomen is nondistended, soft and nontender. Normal bowel sounds heard. Central nervous system: Alert and oriented. No focal neurological deficits. Speech clear.  Extremities: Symmetric in appearance  Skin: No rashes, lesions or ulcers on exposed skin  Psychiatry: Judgement and insight appear stable.    Data Reviewed: I have personally reviewed following labs and imaging studies  CBC: Recent Labs  Lab 03/23/20 1605 03/23/20 1746 03/24/20 0428  WBC 14.2*  --  10.1  NEUTROABS 6.9  --  4.7  HGB 13.0 13.6 12.0  HCT 39.3 40.0 35.6*  MCV 86.6  --  84.2  PLT 372  --  235   Basic Metabolic Panel: Recent Labs  Lab 03/23/20 1605 03/23/20 1746 03/23/20 2033 03/24/20 0428 03/25/20 0202  NA 131* 133* 133* 140 137  K 3.7 3.8 3.2* 3.0* 3.6  CL 97*  --  101 105 104  CO2 18*  --  22 24 22   GLUCOSE 484*  --  191* 164* 264*  BUN 36*  --  28* 16 12  CREATININE 1.44*  --  0.86 0.73 0.71  CALCIUM 9.1  --  9.0 9.4 9.5  MG  --   --   --   --  1.7   GFR: Estimated Creatinine Clearance: 87.4 mL/min (by C-G formula based on SCr of 0.71 mg/dL). Liver Function Tests: Recent Labs  Lab 03/23/20 1605 03/23/20 2033 03/24/20 0428  AST 50* 37 33  ALT 62* 55* 52*  ALKPHOS 71 65 64  BILITOT 0.6 0.5 0.5  PROT 7.4 7.1 6.3*  ALBUMIN 4.2 3.9 3.6   No results for input(s): LIPASE, AMYLASE in the last 168 hours. Recent Labs  Lab  03/24/20 0428  AMMONIA 38*   Coagulation Profile: No results for input(s): INR, PROTIME in the last 168 hours. Cardiac Enzymes: No results for input(s): CKTOTAL, CKMB, CKMBINDEX, TROPONINI in the last 168 hours. BNP (last 3 results) No results for input(s): PROBNP in the last 8760 hours. HbA1C: Recent Labs    03/25/20 0202  HGBA1C 7.6*   CBG: Recent Labs  Lab 03/24/20 1029 03/24/20 1141 03/24/20 1712 03/25/20 0620 03/25/20 0755  GLUCAP 204* 168* 290* 284* 289*   Lipid Profile: Recent Labs    03/24/20 0428  CHOL 217*  HDL 82  LDLCALC 108*  TRIG 136  CHOLHDL 2.6   Thyroid Function Tests: Recent Labs    03/24/20 0428  TSH 2.512   Anemia Panel: Recent Labs    03/24/20 0428  VITAMINB12 678  FOLATE 15.1   Sepsis Labs: No results for input(s): PROCALCITON, LATICACIDVEN in the  last 168 hours.  Recent Results (from the past 240 hour(s))  SARS Coronavirus 2 by RT PCR (hospital order, performed in [facility] hospital lab) Nasopharyngeal Nasopharyngeal Swab     Status: None   Collection Time: 03/23/20  6:00 PM   Specimen: Nasopharyngeal Swab  Result Value Ref Range Status   SARS Coronavirus 2 NEGATIVE NEGATIVE Final    Comment: (NOTE) SARS-CoV-2 target nucleic acids are NOT DETECTED.  The SARS-CoV-2 RNA is generally detectable in upper and lower respiratory specimens during the acute phase of infection. The lowest concentration of SARS-CoV-2 viral copies this assay can detect is 250 copies / mL. A negative result does not preclude SARS-CoV-2 infection and should not be used as the sole basis for treatment or other patient management decisions.  A negative result may occur with improper specimen collection / handling, submission of specimen other than nasopharyngeal swab, presence of viral mutation(s) within the areas targeted by this assay, and inadequate number of viral copies (<250 copies / mL). A negative result must be combined with  clinical observations, patient history, and epidemiological information.  Fact Sheet for Patients:   StrictlyIdeas.no  Fact Sheet for Healthcare Providers: BankingDealers.co.za  This test is not yet approved or  cleared by the [place] FDA and has been authorized for detection and/or diagnosis of SARS-CoV-2 by FDA under an Emergency Use Authorization (EUA).  This EUA will remain in effect (meaning this test can be used) for the duration of the COVID-19 declaration under Section 564(b)(1) of the Act, 21 U.S.C. section 360bbb-3(b)(1), unless the authorization is terminated or revoked sooner.  Performed at [facility], [address], [place], [place] [address]       Radiology Studies: CT Head Wo Contrast  Result Date: 03/23/2020 CLINICAL DATA:  Delirium. EXAM: CT HEAD WITHOUT CONTRAST TECHNIQUE: Contiguous axial images were obtained from the base of the skull through the vertex without intravenous contrast. COMPARISON:  June 17, 2018. FINDINGS: Brain: Low density is seen involving the left basal ganglia and periventricular white matter most consistent with acute infarction. Ventricular size is within normal limits. No midline shift is noted. No definite hemorrhage or mass lesion is noted. Vascular: No hyperdense vessel or unexpected calcification. Skull: Normal. Negative for fracture or focal lesion. Sinuses/Orbits: No acute finding. Other: None. IMPRESSION: Low density is seen involving the left basal ganglia and periventricular white matter most consistent with acute infarction. MRI is recommended for further evaluation. Electronically Signed   By: Marijo Conception M.D.   On: 03/23/2020 16:50   MR BRAIN WO CONTRAST  Result Date: 03/24/2020 CLINICAL DATA:  Stroke on CT EXAM: MRI HEAD WITHOUT CONTRAST TECHNIQUE: Multiplanar, multiecho pulse sequences of the brain and surrounding structures were obtained without intravenous  contrast. COMPARISON:  2019, correlation made with CT from 03/23/2020 FINDINGS: Brain: There is mildly reduced diffusion involving the left caudate, lentiform nucleus, and adjacent internal capsule. No evidence of intracranial hemorrhage. There is no intracranial mass, mass effect, or edema. There is no hydrocephalus or extra-axial fluid collection. Chronic small vessel infarct of the posterior right thalamus. Ventricles and sulci are within normal limits. A few small foci of T2 hyperintensity in the supratentorial and pontine white matter are nonspecific but may reflect minor chronic microvascular ischemic changes. These findings have progressed since 2019. Vascular: Major vessel flow voids at the skull base are preserved. Skull and upper cervical spine: Normal marrow signal is preserved. Sinuses/Orbits: Minor mucosal thickening.  Orbits are unremarkable. Other: Sella  is unremarkable.  Mastoid air cells are clear. IMPRESSION: Acute to subacute infarct involving the left basal ganglia and adjacent white matter. No hemorrhage. Chronic small right thalamic infarct. Minor chronic microvascular ischemic changes. Electronically Signed   By: Macy Mis M.D.   On: 03/24/2020 13:20   DG Chest Port 1 View  Result Date: 03/23/2020 CLINICAL DATA:  Altered mental status for several weeks, possible DKA EXAM: PORTABLE CHEST 1 VIEW COMPARISON:  10/24/2018 FINDINGS: The heart size and mediastinal contours are within normal limits. Both lungs are clear. The visualized skeletal structures are unremarkable. IMPRESSION: No active disease. Electronically Signed   By: Inez Catalina M.D.   On: 03/23/2020 17:43      Scheduled Meds: . aspirin EC  325 mg Oral Daily  . atorvastatin  80 mg Oral Daily  . diltiazem  240 mg Oral Daily  . enoxaparin (LOVENOX) injection  40 mg Subcutaneous Q24H  . FLUoxetine  80 mg Oral QHS  . fluticasone furoate-vilanterol  1 puff Inhalation Daily  . insulin aspart  0-15 Units Subcutaneous TID  WC  . insulin aspart  0-5 Units Subcutaneous QHS  . insulin aspart  5 Units Subcutaneous TID WC  . insulin glargine  50 Units Subcutaneous Daily  . levothyroxine  75 mcg Oral QAC breakfast  . nebivolol  10 mg Oral Daily  . pantoprazole  40 mg Oral BID  . potassium chloride  40 mEq Oral Once  . umeclidinium bromide  1 puff Inhalation Daily   Continuous Infusions:   LOS: 1 day      Time spent: 25 minutes   Dessa Phi, DO [facility] 03/25/2020, 10:11 AM   Available via Epic secure chat 7am-7pm After these hours, please refer to coverage provider listed on [email or web address]

## 2020-03-25 NOTE — Progress Notes (Signed)
STROKE TEAM PROGRESS NOTE   HISTORY OF PRESENT ILLNESS (per record) Cheryl [name] is a 54 y.o. female with past medical history of DM2, migraines, HTN, HLD, anxiety/depression, GERD, DCHF, hypothyroidism, nonalcoholic fatty liver disease, OSA and chronic pain who presents to the hospital with a 3 week history of lethargy and confusion (per chart, patient is slightly confused and a poor historian).  CT head revealed a left BG infarct.  Patient denies any recent fever, chills, N/V/D, headache, weakness, numbness/tingling or slurred speech.   Home medications include atorvastatin. She is not on a blood thinner or an antiplatelet medication.  UDS + benzos,  WBC 14.2 UA Positive for UTI Neurology consulted for new acute/subacute stroke.  LKW: 3 weeks PTA  tpa given?: no, outside TPA window Premorbid modified Rankin scale (mRS): 1-No significant baseline disability and can perform usual duties     INTERVAL HISTORY Her family is not at [place]. Reports she is not feeling better. Denies weakness, sensory changes, only speech difficulties she just "stopped talking".    OBJECTIVE Vitals:   03/24/20 2000 03/24/20 2100 03/24/20 2354 03/25/20 0343  BP:   137/65 (!) 109/59  Pulse: 67 69 65 63  Resp: 15 (!) 23 13 19   Temp:   98.6 F (37 C) 98.4 F (36.9 C)  TempSrc:   Oral Oral  SpO2: 98% 95% 94% 92%  Weight:      Height:        CBC:  Recent Labs  Lab 03/23/20 1605 03/23/20 1605 03/23/20 1746 03/24/20 0428  WBC 14.2*  --   --  10.1  NEUTROABS 6.9  --   --  4.7  HGB 13.0   < > 13.6 12.0  HCT 39.3   < > 40.0 35.6*  MCV 86.6  --   --  84.2  PLT 372  --   --  255   < > = values in this interval not displayed.    Basic Metabolic Panel:  Recent Labs  Lab 03/24/20 0428 03/25/20 0202  NA 140 137  K 3.0* 3.6  CL 105 104  CO2 24 22  GLUCOSE 164* 264*  BUN 16 12  CREATININE 0.73 0.71  CALCIUM 9.4 9.5  MG  --  1.7    Lipid Panel:     Component Value Date/Time   CHOL 217  (H) 03/24/2020 0428   TRIG 136 03/24/2020 0428   HDL 82 03/24/2020 0428   CHOLHDL 2.6 03/24/2020 0428   VLDL 27 03/24/2020 0428   LDLCALC 108 (H) 03/24/2020 0428   HgbA1c:  Lab Results  Component Value Date   HGBA1C 7.6 (H) 03/25/2020   Urine Drug Screen:     Component Value Date/Time   LABOPIA NONE DETECTED 03/24/2020 0346   COCAINSCRNUR NONE DETECTED 03/24/2020 0346   LABBENZ POSITIVE (A) 03/24/2020 0346   AMPHETMU NONE DETECTED 03/24/2020 0346   THCU NONE DETECTED 03/24/2020 0346   LABBARB NONE DETECTED 03/24/2020 0346    Alcohol Level     Component Value Date/Time   ETH <11 02/09/2013 1619    IMAGING  CT Head Wo Contrast 03/23/2020 IMPRESSION:  Low density is seen involving the left basal ganglia and periventricular white matter most consistent with acute infarction. MRI is recommended for further evaluation.   MR BRAIN WO CONTRAST 03/24/2020 IMPRESSION:  Acute to subacute infarct involving the left basal ganglia and adjacent white matter. No hemorrhage. Chronic small right thalamic infarct. Minor chronic microvascular ischemic changes.   DG Chest [facility]  1 View 03/23/2020 IMPRESSION:  No active disease.   Transthoracic Echocardiogram  00/00/2021 Pending  Bilateral Carotid Dopplers  00/00/2021 Pending  ECG - SR rate 60 BPM. (See cardiology reading for complete details)   PHYSICAL EXAM Blood pressure (!) 109/59, pulse 63, temperature 98.4 F (36.9 C), temperature source Oral, resp. rate 19, height 5' 2"  (1.575 m), weight 95.2 kg, SpO2 92 %.  Sitting in chair. Awake and alert.  Answering phone calls. Slow speech but no aphasia or dysarthria. Follows simple verbal commands.  Comprehension appears intact.  Extraocular movements intact.  Pupils equally round and reactive.  Visual fields full to threat in all fields, no facial asymmetry, facial sensation intact, hearing intact to voice, face symmetric, tongue midline, 5 out of 5 strength, tone and normal, bulk is  normal, intact to light touch in all 4 extremities, finger-to-nose intact bilaterally no ataxia or dysmetria noted.  ASSESSMENT/PLAN [name] is a 54 y.o. female with history of DM2, migraines, HTN, HLD, anxiety/depression, GERD, DCHF, hypothyroidism, migraines, nonalcoholic fatty liver disease, OSA and chronic pain who presents to the hospital with a 3 week history of lethargy and confusion. She did not receive IV t-PA due to late presentation (>4.5 hours from time of onset).  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  MRI head - Acute to subacute infarct involving the left basal ganglia and adjacent white matter. No hemorrhage. Chronic small right thalamic infarct. Minor chronic microvascular ischemic changes.   MRA head - ordered w/o contrast- Allergy to Lohexol (Omnipaque) iodinated contrast (anaphylaxis)   CTA H&N - not ordered (recommended by Dr Rory Percy) contrast allergy - would need to check with radiology - Allergy to Lohexol (Omnipaque) iodinated contrast (anaphylaxis)   CT Perfusion - not ordered  Carotid Doppler - will order  2D Echo - will order  Lacey Jensen Virus 2 - negative  LDL - 108  HgbA1c - 7.6  UDS - benzodiazepine  VTE prophylaxis - Lovenox Diet  Diet Order            Diet heart healthy/carb modified Room service appropriate? Yes; Fluid consistency: Thin  Diet effective now                 No antithrombotic prior to admission, now on No antithrombotic will order ASA 325 mg daily  Patient will be counseled to be compliant with her antithrombotic medications  Ongoing aggressive stroke risk factor management  Therapy recommendations:  pending  Disposition:  Pending  Hypertension  Home BP meds: Cozaar ; HCTZ ; Diltiazem ; Bystolic  Current BP meds:  Diltiazem  Blood pressure somewhat low at times.  Out of permissive HTN time window per Dr Cheral Marker . Long-term BP goal normotensive  Hyperlipidemia  Home Lipid lowering medication: Lipitor 20 mg daily  LDL 108, goal < 70  Current lipid lowering medication: Lipitor 20 mg daily -> increased to 80 mg daily  Continue statin at discharge  Diabetes  Home diabetic meds: insulin  Current diabetic meds: insulin  HgbA1c 7.6, goal < 7.0 Recent Labs    03/24/20 1029 03/24/20 1141 03/24/20 1712  GLUCAP 204* 168* 290*    Other Stroke Risk Factors  Former cigarette smoker - quit 11 yrs ago  Obesity, Body mass index is 38.39 kg/m., recommend weight loss, diet and exercise as  appropriate   Hx stroke/TIA by imaging  Migraines  Obstructive sleep apnea, not on CPAP at home  Congestive Heart Failure  Other Active Problems  Code status - Full code  Allergy to Lohexol (Omnipaque) iodinated contrast (anaphylaxis) - would need to discuss contrast with Radiology prior to  CTA   ALT - 55  Will defer to stroke team in the morning if they would like further workup including hypercoag panel.    Hospital day # 1  Personally examined patient and images, and have participated in and made any corrections needed to history, physical, neuro exam,assessment and plan as stated above.  I have personally obtained the history, evaluated lab date, reviewed imaging studies and agree with radiology interpretations.    Sarina Ill, MD Stroke Neurology  I spent 35 minutes of face-to-face and non-face-to-face time with patient. This included prechart review, lab review, study review, order entry, electronic health record documentation, patient education on the different diagnostic and therapeutic options, counseling and coordination of care, risks and benefits of management, compliance, or risk factor reduction   To contact Stroke Continuity provider, please refer to [email or web address] After hours,  contact General Neurology

## 2020-03-25 NOTE — Progress Notes (Signed)
  Echocardiogram 2D Echocardiogram has been performed.  Bobbye Charleston 03/25/2020, 3:44 PM

## 2020-03-26 ENCOUNTER — Inpatient Hospital Stay (HOSPITAL_COMMUNITY): Payer: Medicare HMO

## 2020-03-26 DIAGNOSIS — I639 Cerebral infarction, unspecified: Secondary | ICD-10-CM

## 2020-03-26 LAB — BASIC METABOLIC PANEL
Anion gap: 10 (ref 5–15)
BUN: 11 mg/dL (ref 6–20)
CO2: 22 mmol/L (ref 22–32)
Calcium: 9.1 mg/dL (ref 8.9–10.3)
Chloride: 105 mmol/L (ref 98–111)
Creatinine, Ser: 0.61 mg/dL (ref 0.44–1.00)
GFR calc Af Amer: >60 mL/min (ref >60)
GFR calc non Af Amer: >60 mL/min (ref >60)
Glucose, Bld: 224 mg/dL — ABNORMAL HIGH (ref 70–99)
Potassium: 4.2 mmol/L (ref 3.5–5.1)
Sodium: 137 mmol/L (ref 135–145)

## 2020-03-26 LAB — GLUCOSE, CAPILLARY
Glucose-Capillary: 235 mg/dL — ABNORMAL HIGH (ref 70–99)
Glucose-Capillary: 230 mg/dL — ABNORMAL HIGH (ref 70–99)
Glucose-Capillary: 308 mg/dL — ABNORMAL HIGH (ref 70–99)
Glucose-Capillary: 254 mg/dL — ABNORMAL HIGH (ref 70–99)
Glucose-Capillary: 283 mg/dL — ABNORMAL HIGH (ref 70–99)
Glucose-Capillary: 164 mg/dL — ABNORMAL HIGH (ref 70–99)

## 2020-03-26 MED ORDER — INSULIN GLARGINE 100 UNIT/ML ~~LOC~~ SOLN
60.0000 [IU] | Freq: Every day | SUBCUTANEOUS | Status: DC
  Administered 2020-03-28: 60 [IU] via SUBCUTANEOUS
  Filled 2020-03-26 (×2): qty 0.6

## 2020-03-26 MED ORDER — CLOPIDOGREL BISULFATE 75 MG PO TABS
75.0000 mg | ORAL_TABLET | Freq: Every day | ORAL | Status: DC
  Administered 2020-03-26 – 2020-03-29 (×4): 75 mg via ORAL
  Filled 2020-03-26 (×4): qty 1

## 2020-03-26 MED ORDER — INSULIN ASPART 100 UNIT/ML ~~LOC~~ SOLN
10.0000 [IU] | Freq: Three times a day (TID) | SUBCUTANEOUS | Status: DC
  Administered 2020-03-28: 10 [IU] via SUBCUTANEOUS

## 2020-03-26 MED ORDER — ASPIRIN EC 81 MG PO TBEC
81.0000 mg | DELAYED_RELEASE_TABLET | Freq: Every day | ORAL | Status: DC
  Administered 2020-03-27 – 2020-03-29 (×3): 81 mg via ORAL
  Filled 2020-03-26 (×3): qty 1

## 2020-03-26 NOTE — H&P (View-Only) (Signed)
Carotid study completed.   See CVProc for preliminary results.   Griffin Basil, RDMS, RVT

## 2020-03-26 NOTE — Progress Notes (Addendum)
Swallow study done at bedside with SWAT RN. Patient did cough with water. Speech notified for any recommendations.   1330: MD notified. Per Dr. Maylene Roes, pt. Needs swallow study.

## 2020-03-26 NOTE — Progress Notes (Signed)
Physical Therapy Treatment Patient Details Name: Cheryl [name] MRN: [id number] DOB: 1965-08-27 Today's Date: 03/26/2020    History of Present Illness 54 yo female with onset of confusion and lethargy for 3 weeks was admitted to [facility].  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  PMHx:  DM, gastroparesis, asthma, HLD, HTN, fatty liver disease, osteoporosis, CHF, hypothyroidism, depression, GERD    PT Comments    Today's skilled session continued to focus on mobility progression. Pt continues to need cues for safety and increased time for processing/responding to questions. Acute PT to continue during pt's hospital stay.   Follow Up Recommendations  CIR     Equipment Recommendations  None recommended by PT    Recommendations for Other Services Rehab consult     Precautions / Restrictions Precautions Precautions: Fall Precaution Comments: hx of incontinence this admission    Mobility  Bed Mobility               General bed mobility comments: pt up in chair before and after session  Transfers Overall transfer level: Needs assistance Equipment used: Rolling walker (2 wheeled);None Transfers: Sit to/from Stand Sit to Stand: Min guard         General transfer comment: cues for hand placement when standing from chair>walker. Assist for unsupported standing balance with no AD toward end of session after pt had incontinent bladder episode.  Ambulation/Gait Ambulation/Gait assistance: Min assist Gait Distance (Feet): 60 Feet Assistive device: Rolling walker (2 wheeled) Gait Pattern/deviations: Step-through pattern;Decreased stride length;Trunk flexed Gait velocity: decreased   General Gait Details: pt able to self navigate walker with cues, cues for more upright posture as well.    Modified Rankin (Stroke Patients Only) Modified Rankin (Stroke Patients Only) Pre-Morbid Rankin Score: No significant  disability Modified Rankin: Moderate disability     Balance             Standing balance-Leahy Scale: Poor Standing balance comment: needed single UE support for standing balance for pericare/changing of chair linens after an incontinent episode.            Cognition Arousal/Alertness: Awake/alert Behavior During Therapy: Flat affect Overall Cognitive Status: Impaired/Different from baseline Area of Impairment: Attention;Safety/judgement;Problem solving              Current Attention Level: Selective Memory: Decreased short-term memory Following Commands: Follows one step commands consistently;Follows multi-step commands inconsistently Safety/Judgement: Decreased awareness of safety;Decreased awareness of deficits Awareness: Intellectual Problem Solving: Slow processing;Requires verbal cues;Decreased initiation;Requires tactile cues General Comments: increased time to respond to questions/commands. impulsive at times as well       Pertinent Vitals/Pain Pain Assessment: No/denies pain    Home Living     Available Help at Discharge: Family;Available 24 hours/day Type of Home: House               PT Goals (current goals can now be found in the care plan section) Acute Rehab PT Goals Patient Stated Goal: to get well enough to play with her granddaughter  PT Goal Formulation: With patient Time For Goal Achievement: 04/07/20 Potential to Achieve Goals: Good Progress towards PT goals: Progressing toward goals    Frequency    Min 4X/week      PT Plan Current plan remains appropriate    AM-PAC PT "6 Clicks" Mobility   Outcome Measure  Help needed turning from your back to your side while in a flat bed without using bedrails?: None Help needed moving from  lying on your back to sitting on the side of a flat bed without using bedrails?: A Little Help needed moving to and from a bed to a chair (including a wheelchair)?: A Little Help needed standing up  from a chair using your arms (e.g., wheelchair or bedside chair)?: A Little Help needed to walk in hospital room?: A Little Help needed climbing 3-5 steps with a railing? : A Lot 6 Click Score: 18    End of Session Equipment Utilized During Treatment: Gait belt Activity Tolerance: Patient tolerated treatment well;Patient limited by fatigue Patient left: in chair;with call bell/phone within reach;with chair alarm set Nurse Communication: Mobility status PT Visit Diagnosis: Unsteadiness on feet (R26.81);Muscle weakness (generalized) (M62.81)     Time: 8241-7530 PT Time Calculation (min) (ACUTE ONLY): 18 min  Charges:  $Gait Training: 8-22 mins           Willow Ora, PTA, [phone] 03/26/20, 11:38 AM   Willow Ora 03/26/2020, 11:37 AM

## 2020-03-26 NOTE — Progress Notes (Signed)
Carotid study completed.   See CVProc for preliminary results.   Griffin Basil, RDMS, RVT

## 2020-03-26 NOTE — Progress Notes (Addendum)
STROKE TEAM PROGRESS NOTE   INTERVAL HISTORY Patient is sitting up comfortably in bed.  She she states her speech is improving but not back to baseline.  She has no new complaints.  Vital signs are stable.  Echocardiogram shows normal ejection fraction.  MR I of the brain shows a large 2 cm left basal ganglia/corona radiata infarct.  MRI of the brain shows no large vessel stenosis.  Hemoglobin A1c 7.6.  LDL cholesterol is 108 mg percent.  Carotid ultrasound is pending.I spoke to her daughter Cheryl [name] who stated patient had a fall last Sunday and has been confused since then with speech difficulty and feels stroke happened on Sunday 03/25/20 OBJECTIVE Vitals:   03/26/20 0400 03/26/20 0413 03/26/20 0500 03/26/20 0825  BP:  124/61  (!) 139/51  Pulse: 67 70 67 81  Resp: 14 (!) 21 13 14   Temp:  98.4 F (36.9 C)  97.9 F (36.6 C)  TempSrc:  Oral  Oral  SpO2: 94% 94% 94% 96%  Weight:      Height:       CBC:  Recent Labs  Lab 03/23/20 1605 03/23/20 1605 03/23/20 1746 03/24/20 0428  WBC 14.2*  --   --  10.1  NEUTROABS 6.9  --   --  4.7  HGB 13.0   < > 13.6 12.0  HCT 39.3   < > 40.0 35.6*  MCV 86.6  --   --  84.2  PLT 372  --   --  255   < > = values in this interval not displayed.   Basic Metabolic Panel:  Recent Labs  Lab 03/25/20 0202 03/26/20 0142  NA 137 137  K 3.6 4.2  CL 104 105  CO2 22 22  GLUCOSE 264* 224*  BUN 12 11  CREATININE 0.71 0.61  CALCIUM 9.5 9.1  MG 1.7  --    Lipid Panel:     Component Value Date/Time   CHOL 217 (H) 03/24/2020 0428   TRIG 136 03/24/2020 0428   HDL 82 03/24/2020 0428   CHOLHDL 2.6 03/24/2020 0428   VLDL 27 03/24/2020 0428   LDLCALC 108 (H) 03/24/2020 0428   HgbA1c:  Lab Results  Component Value Date   HGBA1C 7.6 (H) 03/25/2020   Urine Drug Screen:     Component Value Date/Time   LABOPIA NONE DETECTED 03/24/2020 0346   COCAINSCRNUR NONE DETECTED 03/24/2020 0346   LABBENZ POSITIVE (A) 03/24/2020 0346   AMPHETMU NONE DETECTED  03/24/2020 0346   THCU NONE DETECTED 03/24/2020 0346   LABBARB NONE DETECTED 03/24/2020 0346    Alcohol Level     Component Value Date/Time   ETH <11 02/09/2013 1619    IMAGING past 24h MR ANGIO HEAD WO CONTRAST  Result Date: 03/25/2020 CLINICAL DATA:  Stroke follow-up EXAM: MRA HEAD WITHOUT CONTRAST TECHNIQUE: Angiographic images of the Circle of Willis were obtained using MRA technique without intravenous contrast. COMPARISON:  MRI head 03/24/2020 FINDINGS: Both vertebral arteries patent to the basilar. PICA patent bilaterally. Basilar widely patent. Superior cerebellar arteries patent bilaterally that stenosis. Fetal origin left posterior cerebral artery widely patent. Mild stenosis right P1 segment. Internal carotid artery widely patent without stenosis or aneurysm. Anterior and middle cerebral arteries appear normal bilaterally without stenosis or aneurysm. IMPRESSION: No significant intracranial stenosis or large vessel occlusion. Mild stenosis right P1 segment. Electronically Signed   By: Franchot Gallo M.D.   On: 03/25/2020 17:55   ECHOCARDIOGRAM COMPLETE  Result Date: 03/25/2020    ECHOCARDIOGRAM  REPORT   Patient Name:   Cheryl [name] Date of Exam: 03/25/2020 Medical Rec #:  [id number]        Height:       62.0 in Accession #:    [id number]       Weight:       209.9 lb Date of Birth:  Oct 26, 1965        BSA:          1.951 m Patient Age:    21 years         BP:           122/58 mmHg Patient Gender: F                HR:           68 bpm. Exam Location:  Inpatient Procedure: 2D Echo, Cardiac Doppler and Color Doppler Indications:    Stroke.  History:        Patient has prior history of Echocardiogram examinations, most                 recent 02/12/2015. Abnormal ECG, Stroke; Risk                 Factors:Hypertension and Dyslipidemia. Right pneumothorax.  Sonographer:    Roseanna Rainbow RDCS Referring Phys: 82 [place] Comments: Technically difficult study due to poor echo  windows and patient is morbidly obese. Image acquisition challenging due to patient body habitus. Patient had pain in apical region due to fall. IMPRESSIONS  1. Left ventricular ejection fraction, by estimation, is 60 to 65%. The left ventricle has normal function. The left ventricle has no regional wall motion abnormalities. There is moderate left ventricular hypertrophy. Left ventricular diastolic parameters are consistent with Grade I diastolic dysfunction (impaired relaxation).  2. Right ventricular systolic function is normal. The right ventricular size is normal.  3. The mitral valve is normal in structure. No evidence of mitral valve regurgitation. No evidence of mitral stenosis.  4. The aortic valve is normal in structure. Aortic valve regurgitation is not visualized. No aortic stenosis is present.  5. The inferior vena cava is normal in size with greater than 50% respiratory variability, suggesting right atrial pressure of 3 mmHg. Conclusion(s)/Recommendation(s): No intracardiac source of embolism detected on this transthoracic study. A transesophageal echocardiogram is recommended to exclude cardiac source of embolism if clinically indicated. FINDINGS  Left Ventricle: Left ventricular ejection fraction, by estimation, is 60 to 65%. The left ventricle has normal function. The left ventricle has no regional wall motion abnormalities. The left ventricular internal cavity size was normal in size. There is  moderate left ventricular hypertrophy. Left ventricular diastolic parameters are consistent with Grade I diastolic dysfunction (impaired relaxation). Right Ventricle: The right ventricular size is normal. No increase in right ventricular wall thickness. Right ventricular systolic function is normal. Left Atrium: Left atrial size was normal in size. Right Atrium: Right atrial size was normal in size. Pericardium: There is no evidence of pericardial effusion. Mitral Valve: The mitral valve is normal in  structure. Normal mobility of the mitral valve leaflets. No evidence of mitral valve regurgitation. No evidence of mitral valve stenosis. Tricuspid Valve: The tricuspid valve is normal in structure. Tricuspid valve regurgitation is trivial. No evidence of tricuspid stenosis. Aortic Valve: The aortic valve is normal in structure. Aortic valve regurgitation is not visualized. No aortic stenosis is present. Pulmonic Valve: The pulmonic valve was normal in structure. Pulmonic valve regurgitation is  trivial. No evidence of pulmonic stenosis. Aorta: The aortic root is normal in size and structure. Venous: The inferior vena cava is normal in size with greater than 50% respiratory variability, suggesting right atrial pressure of 3 mmHg. IAS/Shunts: No atrial level shunt detected by color flow Doppler.  LEFT VENTRICLE PLAX 2D LVIDd:         4.20 cm     Diastology LVIDs:         2.60 cm     LV e' lateral:   8.81 cm/s LV PW:         1.50 cm     LV E/e' lateral: 4.5 LV IVS:        1.40 cm     LV e' medial:    6.64 cm/s LVOT diam:     2.20 cm     LV E/e' medial:  6.0 LV SV:         71 LV SV Index:   36 LVOT Area:     3.80 cm  LV Volumes (MOD) LV vol d, MOD A2C: 71.9 ml LV vol d, MOD A4C: 65.0 ml LV vol s, MOD A2C: 28.8 ml LV vol s, MOD A4C: 28.6 ml LV SV MOD A2C:     43.1 ml LV SV MOD A4C:     65.0 ml LV SV MOD BP:      42.5 ml RIGHT VENTRICLE             IVC RV S prime:     18.10 cm/s  IVC diam: 1.90 cm TAPSE (M-mode): 2.7 cm LEFT ATRIUM             Index       RIGHT ATRIUM           Index LA diam:        3.30 cm 1.69 cm/m  RA Area:     13.00 cm LA Vol (A2C):   33.7 ml 17.27 ml/m RA Volume:   28.30 ml  14.50 ml/m LA Vol (A4C):   40.8 ml 20.91 ml/m LA Biplane Vol: 38.0 ml 19.47 ml/m  AORTIC VALVE             PULMONIC VALVE LVOT Vmax:   89.40 cm/s  PR End Diast Vel: 1.57 msec LVOT Vmean:  68.200 cm/s LVOT VTI:    0.187 m  AORTA Ao Root diam: 3.00 cm MITRAL VALVE MV Area (PHT): 4.60 cm    SHUNTS MV Decel Time: 165 msec     Systemic VTI:  0.19 m MV E velocity: 39.62 cm/s  Systemic Diam: 2.20 cm MV A velocity: 66.80 cm/s MV E/A ratio:  0.59 Candee Furbish MD Electronically signed by Candee Furbish MD Signature Date/Time: 03/25/2020/5:03:27 PM    Final      PHYSICAL EXAM   Blood pressure (!) 139/51, pulse 81, temperature 97.9 F (36.6 C), temperature source Oral, resp. rate 14, height 5' 2"  (1.575 m), weight 95.2 kg, SpO2 96 %. Pleasant middle-age obese Caucasian lady not in distress Neurological Exam :  awake and alert..  She missed the month and her age.  Slow speech but no aphasia with slight dysarthria. Follows simple verbal commands.  Comprehension appears intact.  Extraocular movements intact.  Pupils equally round and reactive.  Visual fields full to threat in all fields, no facial asymmetry with right lower facial weakness., facial sensation intact, hearing intact to voice, face symmetric, tongue midline, 5 out of 5 strength, tone and normal, bulk is normal, intact  to light touch in all 4 extremities, finger-to-nose intact bilaterally no ataxia or dysmetria noted.  NIH stroke scale score 4.  Premorbid modified Rankin score 0  ASSESSMENT/PLAN [name] is a 54 y.o. female with history of DM2, migraines, HTN, HLD, anxiety/depression, GERD, DCHF, hypothyroidism, migraines, nonalcoholic fatty liver disease, OSA and chronic pain who presents to the hospital with a 3 week history of lethargy and confusion. She did not receive IV t-PA due to late presentation (>4.5 hours from time of onset).  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  MRI head - Acute to subacute infarct involving the left basal ganglia and adjacent white matter. No hemorrhage. Chronic small right thalamic infarct. Minor chronic microvascular ischemic changes.    MRA head - ordered w/o contrast- Allergy to Lohexol (Omnipaque) iodinated contrast (anaphylaxis) -     Carotid Dopplers ; pending  2D echo: ejection fraction 60 to 65%.  No cardiac source embolism.  TEE 03/27/2020 at 130    [employer] Virus 2 - negative  LDL - 108  HgbA1c - 7.6  UDS - benzodiazepine  VTE prophylaxis - Lovenox 40  aspirin 81 mg daily prior to admission per pt not missing any doses, now on aspirin 325 mg daily . Will decrease aspirin dose to 81 and add plavix x 3 weeks then plavix alone    Therapy recommendations: CLR  Disposition:  Pending  Hypertension  Home BP meds: Cozaar ; HCTZ ; Diltiazem ; Bystolic  Current BP meds: Diltiazem  Blood pressure somewhat low at times.  Out of permissive HTN time window per Dr Cheral Marker . Long-term BP goal normotensive  Hyperlipidemia  Home Lipid lowering medication: Lipitor 20 mg daily  LDL 108, goal < 70  Current lipid lowering medication: Lipitor 20 mg daily -> increased to 80 mg daily  Continue statin at discharge  Diabetes  Home diabetic meds: insulin  Current diabetic meds: insulin  HgbA1c 7.6, goal < 7.0 Recent Labs    03/25/20 1831 03/25/20 2053 03/26/20 0553  GLUCAP 218* 276* 230*    Other Stroke Risk Factors  Former cigarette smoker - quit 11 yrs ago  Obesity, Body mass index is 38.39 kg/m., recommend weight loss, diet and exercise as appropriate   Hx stroke/TIA by imaging  Migraines  Obstructive sleep apnea, does not consistently use CPAP at home. Consider [place] study for pt. Dr. Leonie Man / research staff will followup for enrollment eliqibility      Congestive Heart Failure  Other Active Problems  Code status - Full code  Allergy to Lohexol (Omnipaque) iodinated contrast (anaphylaxis) - would need to discuss contrast with Radiology prior to  CTA   ALT - 55  Will defer to stroke team in the morning if they would like further workup including hypercoag panel.     Hospital day # 2  She presented with a large left basal ganglia infarct likely of cryptogenic etiology.  Continue ongoing work-up check carotid ultrasound study, check TEE for cardiac source of embolism and loop recorder for paroxysmal A. fib.  Check ANA panel and anticardiolipin antibodies.  Patient appears to be at risk for sleep apnea and may benefit with consideration for possible participation in the sleep smart study.  She will be given information to review and decide.  Discussed with Dr. Maylene Roes.  Greater than 50% time during this 35-minute visit was spent in counseling and coordination of care about her cryptogenic stroke and discussion about sleep apnea and answering questions Antony Contras, MD  To contact Stroke Continuity provider, please refer to [email or web address] After hours, contact General Neurology

## 2020-03-26 NOTE — Evaluation (Signed)
Speech Language Pathology Evaluation Patient Details Name: Cheryl [name] MRN: [id number] DOB: 09-21-1965 Today's Date: 03/26/2020 Time: 3716-9678 SLP Time Calculation (min) (ACUTE ONLY): 28 min  Problem List:  Patient Active Problem List   Diagnosis Date Noted  . Acute metabolic encephalopathy 93/81/0175  . Stroke due to occlusion of left middle cerebral artery ([place]) 03/24/2020  . Mixed diabetic hyperlipidemia associated with type 2 diabetes mellitus ([place]) 03/24/2020  . Belching 10/11/2019  . Abdominal pain, epigastric 10/11/2019  . Bloating 10/11/2019  . Loss of weight 10/11/2019  . Early satiety 10/11/2019  . Pneumothorax on right 06/28/2019  . ILD (interstitial lung disease) ([place])   . Acute respiratory failure ([place])   . Medication management 06/06/2019  . Lobar pneumonia, unspecified organism ([place]) 05/26/2019  . Fever 05/26/2019  . Dysphagia 04/30/2018  . Hoarseness 04/30/2018  . Chest pain at rest 08/26/2017  . Dyspnea 08/26/2017  . OSA (obstructive sleep apnea) 01/05/2017  . H/O melanoma excision 02/11/2016  . Breast nodule 02/11/2016  . Nausea without vomiting 12/12/2015  . Leucocytosis 12/12/2015  . Thrush 12/12/2015  . Abdominal pain 12/12/2015  . Thoracic aorta atherosclerosis ([place]) 06/06/2015  . Chest wall pain 12/25/2014  . Snoring 06/29/2014  . Obesity, unspecified 02/04/2014  . DKA, type 2 ([place]) 02/03/2014  . Hypothyroidism 03/02/2013  . IDDM (insulin dependent diabetes mellitus) ([place]) 03/01/2013  . Hypoglycemia 02/09/2013  . Hypokalemia 02/09/2013  . Thrush of mouth and esophagus ([place]) 09/12/2012  . Obesity 05/25/2012  . Cough due to angiotensin-converting enzyme inhibitor 07/12/2011  . DDD (degenerative disc disease)   . Edema   . Human parvovirus infection   . Polyarthropathy associated with another disorder   . Fatty liver disease, nonalcoholic   . Migraine headache   . Positive PPD   . Vitamin D deficiency   . Vocal cord dysfunction   .  RHINOSINUSITIS, 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  . Essential hypertension 06/06/2009  . UNSPECIFIED TACHYCARDIA 06/06/2009  . ABNORMAL ELECTROCARDIOGRAM 06/06/2009  . IDDM 04/09/2009  . Asthma not dependent on systemic steroids without complication 05/21/8526  . GERD without esophagitis 04/09/2009  . Chronic fatigue syndrome 04/09/2009   Past Medical History:  Past Medical History:  Diagnosis Date  . Allergy   . Anxiety   . Asthma   . Cancer ([place])    melanoma 2015 upper Left arm   . Chronic airway obstruction, not elsewhere classified   . Chronic pain    "q where; herniated disc in my tailbone" (02/09/2013)  . DDD (degenerative disc disease)    CERVIAL AND LUMBAR  . Depression   . Edema   . Fatty liver disease, nonalcoholic   . Fibromyalgia    "severe" (02/09/2013)  . Gastroparesis    from DM and chronic narcotic use  . GERD (gastroesophageal reflux disease)   . Human parvovirus infection   . Hyperlipidemia   . Hypertension   . Hypothyroidism   . Interstitial cystitis   . Migraine headache    "weekly; worse lately" (02/09/2013)  . OSA (obstructive sleep apnea)    "have mask;; don't use it; no one came out to check it" (02/09/2013)  . Osteoporosis   . Peripheral neuropathy    "severe" (02/09/2013)  . 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 PONV (postoperative nausea and vomiting)   . Positive PPD   . Shortness of breath    "at anytime; it's gotten worse recently" (02/09/2013)  . Sleep apnea   . Type  II diabetes mellitus ([place])   . Vitamin D deficiency   . Vocal cord dysfunction    Past Surgical History:  Past Surgical History:  Procedure Laterality Date  . ABDOMINAL HYSTERECTOMY  1993  . ANTERIOR CERVICAL DECOMP/DISCECTOMY FUSION  2004  . arm surgery     left 2018  . BRONCHOSCOPY  06/28/2019  . CARPAL TUNNEL RELEASE Bilateral ?1980's  . CHOLECYSTECTOMY  ?1990  . COLONOSCOPY    . HIP SURGERY Right 2002   "did something to the  tibula band" (02/09/2013)  . KNEE ARTHROSCOPY Left 1990's   "fell; clot behind knee cap had to be removed" (02/09/2013)  . NASAL SINUS SURGERY  1986; 1990's   "i've had 3" (02/09/2013)  . RIGHT/LEFT HEART CATH AND CORONARY ANGIOGRAPHY N/A 08/26/2017   Procedure: RIGHT/LEFT HEART CATH AND CORONARY ANGIOGRAPHY;  Surgeon: Belva Crome, MD;  Location: [facility];  Service: Cardiovascular;  Laterality: N/A;  . SHOULDER ARTHROSCOPY W/ ROTATOR CUFF REPAIR Right    "had to go deep in rotator cuff" (02/09/2013)  . VIDEO BRONCHOSCOPY Bilateral 06/28/2019   Procedure: VIDEO BRONCHOSCOPY WITH FLUORO;  Surgeon: Marshell Garfinkel, MD;  Location: [facility];  Service: Cardiopulmonary;  Laterality: Bilateral;   HPI:  54 yo female with onset of confusion and lethargy for 3 weeks was admitted to [facility].  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  PMHx:  DM, gastroparesis, asthma, HLD, HTN, fatty liver disease, osteoporosis, CHF, hypothyroidism, depression, GERD   Assessment / Plan / Recommendation Clinical Impression  Pt presents with cognitive/language impairments as evidenced by decreased memory, repetition, and receptive and expressive language abilities. She was awake, alert and was OX2 (self and place). She attended to tasks well but performance was impaired which raised concerns for working memory abilities. Her repetition abilities were impaired, particularly as stimuli became more complex, which is also suspected due to working memory deficits. In addition, recall of new information and short term memory were decreased as evidenced by performance on tasks. Pt demonstrated both receptive and expressive language deficits. Her verbal expression was simple and agrammatic. Pt was able to comprehend converstaion-level speech but unable to follow multi-step commands, likely due to underlying memory deficits. Her rate and volume of speech were also decreased. Pt's  behavior was characterized by signs of anxiety. Performance on clock drawing task was [facility] and the patient demonstrated good sequencing and organizing abilities. Pt utilized simple problem solving strategies throughout the eval. Recommend SLP treatment for cognitive/language impairments. Recommend home health f/u. Discussed recommendations and safety needs with daughter.     SLP Assessment  SLP Recommendation/Assessment: Patient needs continued Speech Lanaguage Pathology Services SLP Visit Diagnosis: Cognitive communication deficit (R41.841)    Follow Up Recommendations  Home health SLP    Frequency and Duration min 2x/week  2 weeks      SLP Evaluation Cognition  Overall Cognitive Status: Impaired/Different from baseline Arousal/Alertness: Awake/alert Orientation Level: Oriented to place;Oriented to person Attention: Focused;Sustained;Selective Focused Attention: Appears intact Sustained Attention: Appears intact Selective Attention: Appears intact Memory: Impaired Memory Impairment: Decreased recall of new information;Decreased short term memory Decreased Short Term Memory: Verbal basic Awareness: Appears intact Problem Solving: Appears intact Executive Function: Organizing;Sequencing Sequencing: Appears intact Organizing: Appears intact Safety/Judgment: Impaired       Comprehension  Auditory Comprehension Overall Auditory Comprehension: Appears within functional limits for tasks assessed Yes/No Questions: Within Functional Limits Commands: Within Functional Limits Visual Recognition/Discrimination Discrimination: Within Function Limits    Expression  Expression Primary Mode of Expression: Verbal Verbal Expression Overall Verbal Expression: Appears within functional limits for tasks assessed Initiation: No impairment Level of Generative/Spontaneous Verbalization: Word;Phrase;Sentence Repetition: Impaired Level of Impairment: Word level;Phrase level;Sentence  level Naming: No impairment Pragmatics: Impairment Impairments: Abnormal affect Effective Techniques:  (repetition ) Written Expression Dominant Hand: Right Written Expression: Not tested   Oral / Motor  Motor Speech Overall Motor Speech: Appears within functional limits for tasks assessed Respiration: Within functional limits Phonation: Low vocal intensity Resonance: Within functional limits Articulation: Within functional limitis Intelligibility: Intelligible Motor Planning: Witnin functional limits Motor Speech Errors: Not applicable   GO                    Greggory Keen 03/26/2020, 11:55 AM

## 2020-03-26 NOTE — Progress Notes (Signed)
Patient's mother, Zigmund Daniel called wanting to speak to the doctor. Per, Dr. Maylene Roes she has spoke with patient's daughter with discharge plans to home.

## 2020-03-26 NOTE — Progress Notes (Addendum)
Inpatient Diabetes Program Recommendations  AACE/ADA: New Consensus Statement on Inpatient Glycemic Control (2015)  Target Ranges:  Prepandial:   less than 140 mg/dL      Peak postprandial:   less than 180 mg/dL (1-2 hours)      Critically ill patients:  140 - 180 mg/dL   Results for LONETA, TAMPLIN "RENEE" (MRN [id number]) as of 03/26/2020 12:03  Ref. Range 03/25/2020 06:20 03/25/2020 07:55 03/25/2020 11:41 03/25/2020 15:58 03/25/2020 18:31 03/25/2020 20:53  Glucose-Capillary Latest Ref Range: 70 - 99 mg/dL 284 (H) 289 (H)  13 units NOVOLOG  286 (H)  13 units NOVOLOG +  50 units LANTUS  332 (H) 218 (H)  10 units NOVOLOG  276 (H)  3 units NOVOLOG    Results for DARNETTA, KESSELMAN "RENEE" (MRN [id number]) as of 03/26/2020 12:03  Ref. Range 03/26/2020 05:53 03/26/2020 11:07  Glucose-Capillary Latest Ref Range: 70 - 99 mg/dL 230 (H)  10 units NOVOLOG given at 7:46am 308 (H)  16 units NOVOLOG +  50 units LANTUS given at 9:12am   Results for JADAMARIE, BUTSON "RENEE" (MRN [id number]) as of 03/26/2020 12:03  Ref. Range 06/28/2019 16:03 03/25/2020 02:02  Hemoglobin A1C Latest Ref Range: 4.8 - 5.6 % 7.2 (H) 7.6 (H)    Admit with: Mild DKA/ Acute CVA (glucose 484 on admission)  History: DM, Gastroparesis  Home DM Meds: Humulin R U-500 Concentrated Insulin 100 units Daily (patient states 130 units AM with Breakfast and 30 units in the PM with Dinner)       Victoza 1.2 mg Daily  Current Orders: Lantus 50 units Daily      Novolog 5 units TID with meals      Novolog Moderate Correction Scale/ SSI (0-15 units) TID AC + HS     MD- Please consider the following in-hospital insulin adjustments:  1. Increase Lantus further to 60 units Daily (AM CBG today was 230)  2. Increase Novolog Meal Coverage to 10 units TID with meals   Addendum 12:55pm--Met w/ pt at bedside to review home DM regimen, events of the last few days.  Discussed with patient diagnosis of DKA (pathophysiology), treatment  of DKA, lab results, and transition plan to SQ insulin regimen.  Explained to pt that we do not have her on her home dose of U500 Insulin but instead Lantus and Novolog.  Explained what these 2 insulins are, how they work, and why we are giving right now.  Encouraged pt to resume her Home U500 Insulin and her Victoza when she goes home.  Reviewed current A1c of 7.6% with pt and also reviewed home DM meds--Pt stated she takes the U500 Concentrated Insulin 130 units AM and 30 units in the PM.  Confirmed her last visit with ENDO Dr. [name] with [facility].  Has another appt with Dr. [name] on 05/03/2020.  Has CBG meter and checks CBGs 3-4 times per day.  States she is able to take her insulin independently but does rely on daughter to drive her to appts right now.  Encouraged pt to continue to take her meds as prescribed and test her CBGs 3-4 times per day.  Pt did not have any questions for me at this time.      --Will follow patient during hospitalization--  Wyn Quaker RN, MSN, CDE Diabetes Coordinator Inpatient Glycemic Control Team Team Pager: [phone] (8a-5p)

## 2020-03-26 NOTE — Progress Notes (Signed)
PROGRESS NOTE    Cheryl [name]  AST:[id number] DOB: [birth year] DOA: 03/23/2020 PCP: Aurea Graff, PA-C (Inactive)     Brief Narrative:  Cheryl [name] is a 54 year old female with past medical history of IDDM type II, diabetic gastroparesis, asthma, gastroesophageal reflux disease, hyperlipidemia, hypertension, nonalcoholic fatty liver disease, osteoporosis, diastolic congestive heart failure(EF during cath 07/2017 65%),hypothyroidism who presents to [facility] emergency department due to a 3-week history of lethargy and confusion. Upon evaluation in the emergency department, patient was found to be in mild diabetic ketoacidosis and therefore was initiated on insulin infusion. Furthermore, basic work-up for patient's confusion included CT imaging of the head without contrast which revealed a left basal ganglia infarct with periventricular white matter ischemic changes concerning for stroke. Hospitalist group was contacted at [facility] for acceptance to the medical floor for continued work-up and treatment.  DKA resolved.  Further imaging with MRI confirmed an acute/subacute stroke.  Neurology was consulted.  New events last 24 hours / Subjective: No new complaints on exam today   Assessment & Plan:   Principal Problem:   Acute metabolic encephalopathy Active Problems:   Essential hypertension   Asthma not dependent on systemic steroids without complication   GERD without esophagitis   Hypothyroidism   DKA, type 2 (HCC)   Stroke due to occlusion of left middle cerebral artery (HCC)   Mixed diabetic hyperlipidemia associated with type 2 diabetes mellitus ([place])   Acute metabolic encephalopathy -Likely secondary to DKA -UDS positive for benzo -Improved since admission   DKA -Hemoglobin A1c 7.6 -Transition off IV insulin -Lantus, NovoLog and NovoLog sliding scale insulin  -Diabetic coordinator consulted   Acute CVA  -CT head revealed low density is  seen involving the left basal ganglia and periventricular white matter most consistent with acute infarction. -MRI brain revealed acute to subacute infarct involving the left basal ganglia and adjacent white matter -Neurology following -PT recommending CIR versus SNF.  Discussed over the phone with daughter, due to ongoing Covid pandemic, she feels that patient would do better at home with home health physical therapy.  TOC consulted -Echocardiogram EF 60-65%, no intracardiac source of embolism -Aspirin, plavix, lipitor  -TEE and loop recorder planned 8/31   Essential hypertension -Continue Cardizem, Bystolic  Hypothyroidism -Continue Synthroid  Hyperlipidemia -Continue Lipitor  GERD -Continue PPI  Depression -Continue Prozac   DVT prophylaxis:  enoxaparin (LOVENOX) injection 40 mg Start: 03/24/20 1000  Code Status: Full Family Communication: Discussed with daughter over the phone 8/30  Disposition Plan:   Status is: Inpatient  Remains inpatient appropriate because:Ongoing diagnostic testing needed not appropriate for outpatient work up   Dispo: The patient is from: Home              Anticipated d/c is to: Home              Anticipated d/c date is: 1 day              Patient currently is not medically stable to d/c. Pending neurology clearance for discharge, TEE and loop recorder placement planned 8/31, home health on discharge (daughter declined CIR/SNF recommendation)    Consultants:   Neurology   Antimicrobials:  Anti-infectives (From admission, onward)   Start     Dose/Rate Route Frequency Ordered Stop   03/23/20 1715  fosfomycin (MONUROL) packet 3 g        3 g Oral  Once 03/23/20 1714 03/23/20 1747       Objective:  Vitals:   03/26/20 0400 03/26/20 0413 03/26/20 0500 03/26/20 0825  BP:  124/61  (!) 139/51  Pulse: 67 70 67 81  Resp: 14 (!) 21 13 14   Temp:  98.4 F (36.9 C)  97.9 F (36.6 C)  TempSrc:  Oral  Oral  SpO2: 94% 94% 94% 96%  Weight:       Height:        Intake/Output Summary (Last 24 hours) at 03/26/2020 1051 Last data filed at 03/26/2020 0826 Gross per 24 hour  Intake --  Output 400 ml  Net -400 ml   Filed Weights   03/23/20 1558 03/24/20 0142  Weight: 90.7 kg 95.2 kg    Examination: General exam: Appears calm and comfortable  Respiratory system: Clear to auscultation. Respiratory effort normal. Cardiovascular system: S1 & S2 heard, RRR. No pedal edema. Gastrointestinal system: Abdomen is nondistended, soft and nontender. Normal bowel sounds heard. Central nervous system: Alert. Non focal exam. Speech clear  Extremities: Symmetric in appearance bilaterally  Skin: No rashes, lesions or ulcers on exposed skin  Psychiatry: Stable   Data Reviewed: I have personally reviewed following labs and imaging studies  CBC: Recent Labs  Lab 03/23/20 1605 03/23/20 1746 03/24/20 0428  WBC 14.2*  --  10.1  NEUTROABS 6.9  --  4.7  HGB 13.0 13.6 12.0  HCT 39.3 40.0 35.6*  MCV 86.6  --  84.2  PLT 372  --  734   Basic Metabolic Panel: Recent Labs  Lab 03/23/20 1605 03/23/20 1605 03/23/20 1746 03/23/20 2033 03/24/20 0428 03/25/20 0202 03/26/20 0142  NA 131*   < > 133* 133* 140 137 137  K 3.7   < > 3.8 3.2* 3.0* 3.6 4.2  CL 97*  --   --  101 105 104 105  CO2 18*  --   --  22 24 22 22   GLUCOSE 484*  --   --  191* 164* 264* 224*  BUN 36*  --   --  28* 16 12 11   CREATININE 1.44*  --   --  0.86 0.73 0.71 0.61  CALCIUM 9.1  --   --  9.0 9.4 9.5 9.1  MG  --   --   --   --   --  1.7  --    < > = values in this interval not displayed.   GFR: Estimated Creatinine Clearance: 87.4 mL/min (by C-G formula based on SCr of 0.61 mg/dL). Liver Function Tests: Recent Labs  Lab 03/23/20 1605 03/23/20 2033 03/24/20 0428  AST 50* 37 33  ALT 62* 55* 52*  ALKPHOS 71 65 64  BILITOT 0.6 0.5 0.5  PROT 7.4 7.1 6.3*  ALBUMIN 4.2 3.9 3.6   No results for input(s): LIPASE, AMYLASE in the last 168 hours. Recent Labs  Lab  03/24/20 0428  AMMONIA 38*   Coagulation Profile: No results for input(s): INR, PROTIME in the last 168 hours. Cardiac Enzymes: No results for input(s): CKTOTAL, CKMB, CKMBINDEX, TROPONINI in the last 168 hours. BNP (last 3 results) No results for input(s): PROBNP in the last 8760 hours. HbA1C: Recent Labs    03/25/20 0202  HGBA1C 7.6*   CBG: Recent Labs  Lab 03/25/20 1141 03/25/20 1558 03/25/20 1831 03/25/20 2053 03/26/20 0553  GLUCAP 286* 332* 218* 276* 230*   Lipid Profile: Recent Labs    03/24/20 0428  CHOL 217*  HDL 82  LDLCALC 108*  TRIG 136  CHOLHDL 2.6   Thyroid Function Tests: Recent Labs  03/24/20 0428  TSH 2.512   Anemia Panel: Recent Labs    03/24/20 0428  VITAMINB12 678  FOLATE 15.1   Sepsis Labs: No results for input(s): PROCALCITON, LATICACIDVEN in the last 168 hours.  Recent Results (from the past 240 hour(s))  SARS Coronavirus 2 by RT PCR (hospital order, performed in [facility] hospital lab) Nasopharyngeal Nasopharyngeal Swab     Status: None   Collection Time: 03/23/20  6:00 PM   Specimen: Nasopharyngeal Swab  Result Value Ref Range Status   SARS Coronavirus 2 NEGATIVE NEGATIVE Final    Comment: (NOTE) SARS-CoV-2 target nucleic acids are NOT DETECTED.  The SARS-CoV-2 RNA is generally detectable in upper and lower respiratory specimens during the acute phase of infection. The lowest concentration of SARS-CoV-2 viral copies this assay can detect is 250 copies / mL. A negative result does not preclude SARS-CoV-2 infection and should not be used as the sole basis for treatment or other patient management decisions.  A negative result may occur with improper specimen collection / handling, submission of specimen other than nasopharyngeal swab, presence of viral mutation(s) within the areas targeted by this assay, and inadequate number of viral copies (<250 copies / mL). A negative result must be combined with  clinical observations, patient history, and epidemiological information.  Fact Sheet for Patients:   StrictlyIdeas.no  Fact Sheet for Healthcare Providers: BankingDealers.co.za  This test is not yet approved or  cleared by the [place] FDA and has been authorized for detection and/or diagnosis of SARS-CoV-2 by FDA under an Emergency Use Authorization (EUA).  This EUA will remain in effect (meaning this test can be used) for the duration of the COVID-19 declaration under Section 564(b)(1) of the Act, 21 U.S.C. section 360bbb-3(b)(1), unless the authorization is terminated or revoked sooner.  Performed at [facility], [address] [place], [place] [address]       Radiology Studies: MR ANGIO HEAD WO CONTRAST  Result Date: 03/25/2020 CLINICAL DATA:  Stroke follow-up EXAM: MRA HEAD WITHOUT CONTRAST TECHNIQUE: Angiographic images of the Circle of Willis were obtained using MRA technique without intravenous contrast. COMPARISON:  MRI head 03/24/2020 FINDINGS: Both vertebral arteries patent to the basilar. PICA patent bilaterally. Basilar widely patent. Superior cerebellar arteries patent bilaterally that stenosis. Fetal origin left posterior cerebral artery widely patent. Mild stenosis right P1 segment. Internal carotid artery widely patent without stenosis or aneurysm. Anterior and middle cerebral arteries appear normal bilaterally without stenosis or aneurysm. IMPRESSION: No significant intracranial stenosis or large vessel occlusion. Mild stenosis right P1 segment. Electronically Signed   By: Franchot Gallo M.D.   On: 03/25/2020 17:55   MR BRAIN WO CONTRAST  Result Date: 03/24/2020 CLINICAL DATA:  Stroke on CT EXAM: MRI HEAD WITHOUT CONTRAST TECHNIQUE: Multiplanar, multiecho pulse sequences of the brain and surrounding structures were obtained without intravenous contrast. COMPARISON:  2019, correlation made with CT from  03/23/2020 FINDINGS: Brain: There is mildly reduced diffusion involving the left caudate, lentiform nucleus, and adjacent internal capsule. No evidence of intracranial hemorrhage. There is no intracranial mass, mass effect, or edema. There is no hydrocephalus or extra-axial fluid collection. Chronic small vessel infarct of the posterior right thalamus. Ventricles and sulci are within normal limits. A few small foci of T2 hyperintensity in the supratentorial and pontine white matter are nonspecific but may reflect minor chronic microvascular ischemic changes. These findings have progressed since 2019. Vascular: Major vessel flow voids at the skull base are preserved. Skull and upper  cervical spine: Normal marrow signal is preserved. Sinuses/Orbits: Minor mucosal thickening.  Orbits are unremarkable. Other: Sella is unremarkable.  Mastoid air cells are clear. IMPRESSION: Acute to subacute infarct involving the left basal ganglia and adjacent white matter. No hemorrhage. Chronic small right thalamic infarct. Minor chronic microvascular ischemic changes. Electronically Signed   By: Macy Mis M.D.   On: 03/24/2020 13:20   ECHOCARDIOGRAM COMPLETE  Result Date: 03/25/2020    ECHOCARDIOGRAM REPORT   Patient Name:   Cheryl [name] Date of Exam: 03/25/2020 Medical Rec #:  [id number]        Height:       62.0 in Accession #:    [id number]       Weight:       209.9 lb Date of Birth:  October 27, 1965        BSA:          1.951 m Patient Age:    35 years         BP:           122/58 mmHg Patient Gender: F                HR:           68 bpm. Exam Location:  Inpatient Procedure: 2D Echo, Cardiac Doppler and Color Doppler Indications:    Stroke.  History:        Patient has prior history of Echocardiogram examinations, most                 recent 02/12/2015. Abnormal ECG, Stroke; Risk                 Factors:Hypertension and Dyslipidemia. Right pneumothorax.  Sonographer:    Roseanna Rainbow RDCS Referring Phys: [address] [place] Comments: Technically difficult study due to poor echo windows and patient is morbidly obese. Image acquisition challenging due to patient body habitus. Patient had pain in apical region due to fall. IMPRESSIONS  1. Left ventricular ejection fraction, by estimation, is 60 to 65%. The left ventricle has normal function. The left ventricle has no regional wall motion abnormalities. There is moderate left ventricular hypertrophy. Left ventricular diastolic parameters are consistent with Grade I diastolic dysfunction (impaired relaxation).  2. Right ventricular systolic function is normal. The right ventricular size is normal.  3. The mitral valve is normal in structure. No evidence of mitral valve regurgitation. No evidence of mitral stenosis.  4. The aortic valve is normal in structure. Aortic valve regurgitation is not visualized. No aortic stenosis is present.  5. The inferior vena cava is normal in size with greater than 50% respiratory variability, suggesting right atrial pressure of 3 mmHg. Conclusion(s)/Recommendation(s): No intracardiac source of embolism detected on this transthoracic study. A transesophageal echocardiogram is recommended to exclude cardiac source of embolism if clinically indicated. FINDINGS  Left Ventricle: Left ventricular ejection fraction, by estimation, is 60 to 65%. The left ventricle has normal function. The left ventricle has no regional wall motion abnormalities. The left ventricular internal cavity size was normal in size. There is  moderate left ventricular hypertrophy. Left ventricular diastolic parameters are consistent with Grade I diastolic dysfunction (impaired relaxation). Right Ventricle: The right ventricular size is normal. No increase in right ventricular wall thickness. Right ventricular systolic function is normal. Left Atrium: Left atrial size was normal in size. Right Atrium: Right atrial size was normal in size. Pericardium: There is no evidence of  pericardial effusion. Mitral Valve: The  mitral valve is normal in structure. Normal mobility of the mitral valve leaflets. No evidence of mitral valve regurgitation. No evidence of mitral valve stenosis. Tricuspid Valve: The tricuspid valve is normal in structure. Tricuspid valve regurgitation is trivial. No evidence of tricuspid stenosis. Aortic Valve: The aortic valve is normal in structure. Aortic valve regurgitation is not visualized. No aortic stenosis is present. Pulmonic Valve: The pulmonic valve was normal in structure. Pulmonic valve regurgitation is trivial. No evidence of pulmonic stenosis. Aorta: The aortic root is normal in size and structure. Venous: The inferior vena cava is normal in size with greater than 50% respiratory variability, suggesting right atrial pressure of 3 mmHg. IAS/Shunts: No atrial level shunt detected by color flow Doppler.  LEFT VENTRICLE PLAX 2D LVIDd:         4.20 cm     Diastology LVIDs:         2.60 cm     LV e' lateral:   8.81 cm/s LV PW:         1.50 cm     LV E/e' lateral: 4.5 LV IVS:        1.40 cm     LV e' medial:    6.64 cm/s LVOT diam:     2.20 cm     LV E/e' medial:  6.0 LV SV:         71 LV SV Index:   36 LVOT Area:     3.80 cm  LV Volumes (MOD) LV vol d, MOD A2C: 71.9 ml LV vol d, MOD A4C: 65.0 ml LV vol s, MOD A2C: 28.8 ml LV vol s, MOD A4C: 28.6 ml LV SV MOD A2C:     43.1 ml LV SV MOD A4C:     65.0 ml LV SV MOD BP:      42.5 ml RIGHT VENTRICLE             IVC RV S prime:     18.10 cm/s  IVC diam: 1.90 cm TAPSE (M-mode): 2.7 cm LEFT ATRIUM             Index       RIGHT ATRIUM           Index LA diam:        3.30 cm 1.69 cm/m  RA Area:     13.00 cm LA Vol (A2C):   33.7 ml 17.27 ml/m RA Volume:   28.30 ml  14.50 ml/m LA Vol (A4C):   40.8 ml 20.91 ml/m LA Biplane Vol: 38.0 ml 19.47 ml/m  AORTIC VALVE             PULMONIC VALVE LVOT Vmax:   89.40 cm/s  PR End Diast Vel: 1.57 msec LVOT Vmean:  68.200 cm/s LVOT VTI:    0.187 m  AORTA Ao Root diam: 3.00 cm MITRAL  VALVE MV Area (PHT): 4.60 cm    SHUNTS MV Decel Time: 165 msec    Systemic VTI:  0.19 m MV E velocity: 39.62 cm/s  Systemic Diam: 2.20 cm MV A velocity: 66.80 cm/s MV E/A ratio:  0.59 Candee Furbish MD Electronically signed by Candee Furbish MD Signature Date/Time: 03/25/2020/5:03:27 PM    Final       Scheduled Meds: . Derrill Memo ON 03/27/2020] aspirin EC  81 mg Oral Daily  . atorvastatin  80 mg Oral Daily  . clopidogrel  75 mg Oral Daily  . diltiazem  240 mg Oral Daily  . enoxaparin (LOVENOX) injection  40  mg Subcutaneous Q24H  . FLUoxetine  80 mg Oral QHS  . fluticasone furoate-vilanterol  1 puff Inhalation Daily  . insulin aspart  0-15 Units Subcutaneous TID WC  . insulin aspart  0-5 Units Subcutaneous QHS  . insulin aspart  5 Units Subcutaneous TID WC  . insulin glargine  50 Units Subcutaneous Daily  . levothyroxine  75 mcg Oral QAC breakfast  . nebivolol  10 mg Oral Daily  . pantoprazole  40 mg Oral BID  . umeclidinium bromide  1 puff Inhalation Daily   Continuous Infusions:   LOS: 2 days      Time spent: 25 minutes   Dessa Phi, DO [facility] 03/26/2020, 10:51 AM   Available via Epic secure chat 7am-7pm After these hours, please refer to coverage provider listed on [email or web address]

## 2020-03-27 ENCOUNTER — Inpatient Hospital Stay (HOSPITAL_COMMUNITY): Payer: Medicare HMO

## 2020-03-27 ENCOUNTER — Encounter (HOSPITAL_COMMUNITY): Payer: Self-pay | Admitting: Internal Medicine

## 2020-03-27 ENCOUNTER — Encounter (HOSPITAL_COMMUNITY)
Admission: EM | Disposition: A | Discharge status: Home Health | Payer: Self-pay | Source: Home / Self Care | Attending: Internal Medicine

## 2020-03-27 ENCOUNTER — Inpatient Hospital Stay (HOSPITAL_COMMUNITY): Payer: Medicare HMO | Admitting: Certified Registered"

## 2020-03-27 DIAGNOSIS — I10 Essential (primary) hypertension: Secondary | ICD-10-CM

## 2020-03-27 DIAGNOSIS — R0602 Shortness of breath: Secondary | ICD-10-CM

## 2020-03-27 DIAGNOSIS — I6389 Other cerebral infarction: Secondary | ICD-10-CM

## 2020-03-27 DIAGNOSIS — R079 Chest pain, unspecified: Secondary | ICD-10-CM

## 2020-03-27 DIAGNOSIS — I639 Cerebral infarction, unspecified: Secondary | ICD-10-CM

## 2020-03-27 HISTORY — PX: TEE WITHOUT CARDIOVERSION: SHX5443

## 2020-03-27 HISTORY — PX: BUBBLE STUDY: SHX6837

## 2020-03-27 HISTORY — PX: LOOP RECORDER INSERTION: EP1214

## 2020-03-27 LAB — GLUCOSE, CAPILLARY
Glucose-Capillary: 171 mg/dL — ABNORMAL HIGH (ref 70–99)
Glucose-Capillary: 212 mg/dL — ABNORMAL HIGH (ref 70–99)
Glucose-Capillary: 150 mg/dL — ABNORMAL HIGH (ref 70–99)
Glucose-Capillary: 288 mg/dL — ABNORMAL HIGH (ref 70–99)

## 2020-03-27 SURGERY — LOOP RECORDER INSERTION
Anesthesia: LOCAL

## 2020-03-27 SURGERY — ECHOCARDIOGRAM, TRANSESOPHAGEAL
Anesthesia: Monitor Anesthesia Care

## 2020-03-27 MED ORDER — LOSARTAN POTASSIUM 25 MG PO TABS
25.0000 mg | ORAL_TABLET | Freq: Every day | ORAL | Status: DC
  Administered 2020-03-27 – 2020-03-29 (×3): 25 mg via ORAL
  Filled 2020-03-27 (×3): qty 1

## 2020-03-27 MED ORDER — LIDOCAINE-EPINEPHRINE 1 %-1:100000 IJ SOLN
INTRAMUSCULAR | Status: AC
  Filled 2020-03-27: qty 1

## 2020-03-27 MED ORDER — LIDOCAINE-EPINEPHRINE 1 %-1:100000 IJ SOLN
INTRAMUSCULAR | Status: DC | PRN
  Administered 2020-03-27: 20 mL

## 2020-03-27 MED ORDER — METHYLPREDNISOLONE 4 MG PO TBPK: ORAL_TABLET | Freq: Every morning | ORAL | Status: DC

## 2020-03-27 MED ORDER — METOCLOPRAMIDE HCL 10 MG PO TABS
10.0000 mg | ORAL_TABLET | Freq: Three times a day (TID) | ORAL | Status: DC
  Administered 2020-03-27 – 2020-03-29 (×6): 10 mg via ORAL
  Filled 2020-03-27 (×6): qty 1

## 2020-03-27 MED ORDER — SODIUM CHLORIDE 0.9 % IV SOLN: INTRAVENOUS | Status: DC | PRN

## 2020-03-27 MED ORDER — PROPOFOL 500 MG/50ML IV EMUL
INTRAVENOUS | Status: DC | PRN
  Administered 2020-03-27: 150 ug/kg/min via INTRAVENOUS
  Administered 2020-03-27: 25 mg via INTRAVENOUS

## 2020-03-27 MED ORDER — PROMETHAZINE HCL 25 MG PO TABS
25.0000 mg | ORAL_TABLET | Freq: Two times a day (BID) | ORAL | Status: DC | PRN
  Filled 2020-03-27: qty 1

## 2020-03-27 SURGICAL SUPPLY — 2 items
MONITOR REVEAL LINQ II (Prosthesis & Implant Heart) ×1 IMPLANT
PACK LOOP INSERTION (CUSTOM PROCEDURE TRAY) ×2 IMPLANT

## 2020-03-27 NOTE — Interval H&P Note (Signed)
History and Physical Interval Note:  03/27/2020 1:57 PM  Cheryl [name]  has presented today for surgery, with the diagnosis of stroke.  The various methods of treatment have been discussed with the patient and family. After consideration of risks, benefits and other options for treatment, the patient has consented to  Procedure(s): LOOP RECORDER INSERTION (N/A) as a surgical intervention.  The patient's history has been reviewed, patient examined, no change in status, stable for surgery.  I have reviewed the patient's chart and labs.  Questions were answered to the patient's satisfaction.     Thompson Grayer

## 2020-03-27 NOTE — H&P (View-Only) (Signed)
ELECTROPHYSIOLOGY CONSULT NOTE  Patient ID: Cheryl [name] MRN: [id number], DOB/AGE: 12/05/1965   Admit date: 03/23/2020 Date of Consult: 03/27/2020  Primary Physician: Aurea Graff, PA-C (Inactive) Primary Cardiologist: Dr. Johnsie Cancel  Reason for Consultation: Cryptogenic stroke ; recommendations regarding Implantable Loop Recorder, requested by Dr. Leonie Man  History of Present Illness Cheryl [name] was admitted on 03/23/2020 with progressive lethargy and confusion over a period of a few weeks  PMHx includes, DM, HTN, migraine HAs, anxiety/depression, GERD, hypothyroidism, OSA, fatty liver  Neurology notes:acute to subacute infarct involving the left basal ganglia, Dr. Leonie Man recommends TEE and loop if negative  .  she has undergone workup for stroke including echocardiogram and carotid dopplers.  The patient has been monitored on telemetry which has demonstrated sinus rhythm with no arrhythmias.  Inpatient stroke work-up is to be completed with a TEE.   Echocardiogram this admission demonstrated   IMPRESSIONS  1. Left ventricular ejection fraction, by estimation, is 60 to 65%. The  left ventricle has normal function. The left ventricle has no regional  wall motion abnormalities. There is moderate left ventricular hypertrophy.  Left ventricular diastolic  parameters are consistent with Grade I diastolic dysfunction (impaired  relaxation).  2. Right ventricular systolic function is normal. The right ventricular  size is normal.  3. The mitral valve is normal in structure. No evidence of mitral valve  regurgitation. No evidence of mitral stenosis.  4. The aortic valve is normal in structure. Aortic valve regurgitation is  not visualized. No aortic stenosis is present.  5. The inferior vena cava is normal in size with greater than 50%  respiratory variability, suggesting right atrial pressure of 3 mmHg.   Conclusion(s)/Recommendation(s): No intracardiac source of embolism   detected on this transthoracic study. A transesophageal echocardiogram is  recommended to exclude cardiac source of embolism if clinically indicated.    Lab work is reviewed.   Prior to admission, the patient denies chest pain, shortness of breath, dizziness, or syncope.  They are recovering from their stroke with plans it seems to home (family refusing rehab) at discharge.  In d/w the patient's daughter, understands discharge timing hopefully by tomorrow, preferring home health to an inpatient given COVID concerns.  She does have palpitations, these are somewhat sporadic, on/off with sometimes months in between and she had felt 2/2 BS changes or anxiety  Past Medical History:  Diagnosis Date   Allergy    Anxiety    Asthma    Cancer ([place])    melanoma 2015 upper Left arm    Chronic airway obstruction, not elsewhere classified    Chronic pain    "q where; herniated disc in my tailbone" (02/09/2013)   DDD (degenerative disc disease)    CERVIAL AND LUMBAR   Depression    Edema    Fatty liver disease, nonalcoholic    Fibromyalgia    "severe" (02/09/2013)   Gastroparesis    from DM and chronic narcotic use   GERD (gastroesophageal reflux disease)    Human parvovirus infection    Hyperlipidemia    Hypertension    Hypothyroidism    Interstitial cystitis    Migraine headache    "weekly; worse lately" (02/09/2013)   OSA (obstructive sleep apnea)    "have mask;; don't use it; no one came out to check it" (02/09/2013)   Osteoporosis    Peripheral neuropathy    "severe" (02/09/2013)   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  PONV (postoperative nausea and vomiting)    Positive PPD    Shortness of breath    "at anytime; it's gotten worse recently" (02/09/2013)   Sleep apnea    Type II diabetes mellitus ([place])    Vitamin D deficiency    Vocal cord dysfunction      Surgical History:  Past Surgical  History:  Procedure Laterality Date   ABDOMINAL HYSTERECTOMY  1993   ANTERIOR CERVICAL DECOMP/DISCECTOMY FUSION  2004   arm surgery     left 2018   BRONCHOSCOPY  06/28/2019   CARPAL TUNNEL RELEASE Bilateral ?1980's   CHOLECYSTECTOMY  ?1990   COLONOSCOPY     HIP SURGERY Right 2002   "did something to the tibula band" (02/09/2013)   KNEE ARTHROSCOPY Left 1990's   "fell; clot behind knee cap had to be removed" (02/09/2013)   [place]; 1990's   "i've had 3" (02/09/2013)   RIGHT/LEFT HEART CATH AND CORONARY ANGIOGRAPHY N/A 08/26/2017   Procedure: RIGHT/LEFT HEART CATH AND CORONARY ANGIOGRAPHY;  Surgeon: Belva Crome, MD;  Location: [facility];  Service: Cardiovascular;  Laterality: N/A;   SHOULDER ARTHROSCOPY W/ ROTATOR CUFF REPAIR Right    "had to go deep in rotator cuff" (02/09/2013)   VIDEO BRONCHOSCOPY Bilateral 06/28/2019   Procedure: VIDEO BRONCHOSCOPY WITH FLUORO;  Surgeon: Marshell Garfinkel, MD;  Location: [facility];  Service: Cardiopulmonary;  Laterality: Bilateral;     Medications Prior to Admission  Medication Sig Dispense Refill Last Dose   albuterol (VENTOLIN HFA) 108 (90 Base) MCG/ACT inhaler INHALE 2 PUFFS EVERY 6 HOURS AS NEEDED FOR WHEEZING OR SHORTNESS OF BREATH. 18 g 2 Past Week at Unknown time   atorvastatin (LIPITOR) 20 MG tablet Take 20 mg by mouth daily.   03/23/2020 at Unknown time   azithromycin (ZITHROMAX) 250 MG tablet Take 250 mg by mouth as directed. Started on 03-19-20 for 5 day therapy. Patient on day 3.   03/23/2020 at Unknown time   Cholecalciferol (VITAMIN D3) 1000 units CAPS Take 1,000 Units by mouth daily.   03/23/2020 at Unknown time   diazepam (VALIUM) 10 MG tablet Take 10 mg by mouth 2 (two) times daily as needed (panic attacks.).    03/23/2020 at Unknown time   diltiazem (CARDIZEM CD) 240 MG 24 hr capsule TAKE 1 CAPSULE BY MOUTH EVERY MORNING ON AN EMPTY STOMACH - [name] DENIED, FAXED DR Johnsie Cancel 10/22/15 SS  (Patient taking differently: Take 240 mg by mouth daily. ) 30 capsule 5 03/23/2020 at Unknown time   FLUoxetine (PROZAC) 40 MG capsule Take 80 mg by mouth at bedtime.    03/23/2020 at Unknown time   Fluticasone-Umeclidin-Vilant (TRELEGY ELLIPTA) 100-62.5-25 MCG/INH AEPB Inhale 1 puff into the lungs daily. 2 each 0 03/23/2020 at Unknown time   gabapentin (NEURONTIN) 300 MG capsule Take 600 mg by mouth at bedtime.    03/23/2020 at Unknown time   HUMULIN R U-500 KWIKPEN 500 UNIT/ML kwikpen Inject 100 Units into the skin daily.    03/23/2020 at Unknown time   hydrochlorothiazide (HYDRODIURIL) 25 MG tablet Take 25 mg by mouth daily.   03/23/2020 at Unknown time   hydrOXYzine (ATARAX/VISTARIL) 25 MG tablet Take 50 mg by mouth at bedtime.   03/23/2020 at Unknown time   ibuprofen (ADVIL,MOTRIN) 800 MG tablet Take 800 mg by mouth every 8 (eight) hours as needed (pain).    Past Week at Unknown time   ipratropium-albuterol (DUONEB) 0.5-2.5 (3) MG/3ML SOLN Take 59m by  nebulization every 4-6 hours if needed (Patient taking differently: Inhale 3 mLs into the lungs every 4 (four) hours as needed (wheezing/shortness of breath.). ) 1080 mL 3 03/23/2020 at Unknown time   levothyroxine (SYNTHROID, LEVOTHROID) 75 MCG tablet Take 75 mcg by mouth daily before breakfast.   03/23/2020 at Unknown time   liraglutide (VICTOZA) 18 MG/3ML SOPN Inject 1.2 mg into the skin every evening.    03/23/2020 at Unknown time   losartan (COZAAR) 25 MG tablet Take 25 mg by mouth daily.   03/23/2020 at Unknown time   methylPREDNISolone (MEDROL DOSEPAK) 4 MG TBPK tablet Take by mouth. Patient on dose pack. Patient on day 1 of therapy.   03/23/2020 at Unknown time   metoCLOPramide (REGLAN) 10 MG tablet Take 10 mg by mouth 3 (three) times daily.   03/23/2020 at Unknown time   nebivolol (BYSTOLIC) 10 MG tablet Take 1 tablet (10 mg total) by mouth daily. 90 tablet 3 03/23/2020 at 8a   nystatin-triamcinolone (MYCOLOG II) cream Apply 1 application  topically 2 (two) times daily as needed (yeast in skin folds).    Past Week at Unknown time   pantoprazole (PROTONIX) 40 MG tablet Take 1 tablet (40 mg total) by mouth 2 (two) times daily. 180 tablet 4 03/23/2020 at Unknown time   pentosan polysulfate (ELMIRON) 100 MG capsule Take 100 mg by mouth 3 (three) times daily.   03/23/2020 at Unknown time   promethazine (PHENERGAN) 25 MG tablet Take 25 mg by mouth 2 (two) times daily as needed for nausea.   03/23/2020 at Unknown time   SUMAtriptan (IMITREX) 20 MG/ACT nasal spray Place 20 mg into the nose every 2 (two) hours as needed for migraine or headache. May repeat in 2 hours if headache persists or recurs.   Past Month at Unknown time   tiZANidine (ZANAFLEX) 4 MG tablet Take 4 mg by mouth 3 (three) times daily. May hold one dose if needed to operate motor vehicle.   03/23/2020 at Unknown time   metoCLOPramide (REGLAN) 5 MG tablet Take 1 tablet (5 mg total) by mouth 4 (four) times daily. 120 tablet 0    omeprazole (PRILOSEC) 40 MG capsule Take 1 capsule (40 mg total) by mouth 2 (two) times daily. MUST HAVE OFFICE VISIT FOR FURTHER REFILLS (Patient not taking: Reported on 03/24/2020) 180 capsule 0 Not Taking at Unknown time   ondansetron (ZOFRAN ODT) 4 MG disintegrating tablet Take 1 tablet (4 mg total) by mouth every 6 (six) hours as needed for nausea or vomiting. (Patient not taking: Reported on 03/24/2020) 30 tablet 1 Not Taking at Unknown time    Inpatient Medications:   aspirin EC  81 mg Oral Daily   atorvastatin  80 mg Oral Daily   clopidogrel  75 mg Oral Daily   diltiazem  240 mg Oral Daily   enoxaparin (LOVENOX) injection  40 mg Subcutaneous Q24H   FLUoxetine  80 mg Oral QHS   fluticasone furoate-vilanterol  1 puff Inhalation Daily   insulin aspart  0-15 Units Subcutaneous TID WC   insulin aspart  0-5 Units Subcutaneous QHS   insulin aspart  10 Units Subcutaneous TID WC   insulin glargine  60 Units Subcutaneous Daily    levothyroxine  75 mcg Oral QAC breakfast   nebivolol  10 mg Oral Daily   pantoprazole  40 mg Oral BID   umeclidinium bromide  1 puff Inhalation Daily    Allergies:  Allergies  Allergen Reactions   Influenza Vaccines Anaphylaxis  and Swelling    Throat swelling, "flu" symptoms   Iodinated Diagnostic Agents Shortness Of Breath, Nausea And Vomiting, Nausea Only and Rash   Iodine-131 Shortness Of Breath, Nausea And Vomiting and Rash   Iohexol Anaphylaxis, Hives and Swelling     Desc: hives,dyspnea; throat swelling; ok w/ premeds and omnipaque    Levofloxacin Anaphylaxis and Hives   Nucynta [Tapentadol Hydrochloride] Other (See Comments)    Hallucinations and  insomnia x3 days   Pregabalin Other (See Comments)    (lyrica)Reaction-Syncope & unable to speak   Sulfonamide Derivatives Anaphylaxis   Vantin [Cefpodoxime] Anaphylaxis, Hives, Rash and Cough   Vilazodone Hcl Other (See Comments)    (Viibryd) Hallucinations   Amoxicillin Hives and Itching    Did it involve swelling of the face/tongue/throat, SOB, or low BP? No Did it involve sudden or severe rash/hives, skin peeling, or any reaction on the inside of your mouth or nose? Yes Did you need to seek medical attention at a hospital or doctor's office? No When did it last happen?3-4 years ago If all above answers are NO, may proceed with cephalosporin use. .   Biaxin [Clarithromycin] Hives   Carbamazepine Itching    (Tegretol)   Ciprofloxacin Hives   Dilaudid [Hydromorphone Hcl] Itching and Rash    redness   Iodides Nausea Only   Percocet [Oxycodone-Acetaminophen] Rash    Social History   Socioeconomic History   Marital status: Divorced    Spouse name: Not on file   Number of children: Not on file   Years of education: Not on file   Highest education level: Not on file  Occupational History   Not on file  Tobacco Use   Smoking status: Former Smoker    Packs/day: 0.10    Years: 3.00    Pack  years: 0.30    Types: Cigarettes    Quit date: 2010    Years since quitting: 11.6   Smokeless tobacco: Never Used   Tobacco comment: only smokes in high school then every now and then  Vaping Use   Vaping Use: Never used  Substance and Sexual Activity   Alcohol use: No   Drug use: No   Sexual activity: Never  Other Topics Concern   Not on file  Social History Narrative   Lives with daughter in a one story home.  On disability.  Education: college.     Social Determinants of Health   Financial Resource Strain:    Difficulty of Paying Living Expenses: Not on file  Food Insecurity: No Food Insecurity   Worried About Charity fundraiser in the Last Year: Never true   Ran Out of Food in the Last Year: Never true  Transportation Needs: No Transportation Needs   Lack of Transportation (Medical): No   Lack of Transportation (Non-Medical): No  Physical Activity:    Days of Exercise per Week: Not on file   Minutes of Exercise per Session: Not on file  Stress:    Feeling of Stress : Not on file  Social Connections:    Frequency of Communication with Friends and Family: Not on file   Frequency of Social Gatherings with Friends and Family: Not on file   Attends Religious Services: Not on file   Active Member of Clubs or Organizations: Not on file   Attends Archivist Meetings: Not on file   Marital Status: Not on file  Intimate Partner Violence:    Fear of Current or Ex-Partner: Not on  file   Emotionally Abused: Not on file   Physically Abused: Not on file   Sexually Abused: Not on file     Family History  Problem Relation Age of Onset   Coronary artery disease Father    Diabetes Brother    Diabetes Brother    Breast cancer Maternal Aunt    Breast cancer Cousin    Breast cancer Cousin    Colon cancer Neg Hx    Esophageal cancer Neg Hx    Liver cancer Neg Hx    Pancreatic cancer Neg Hx    Rectal cancer Neg Hx    Stomach  cancer Neg Hx       Review of Systems: All other systems reviewed and are otherwise negative except as noted above.  Physical Exam: Vitals:   03/27/20 0000 03/27/20 0735 03/27/20 0828 03/27/20 1109  BP: 121/68 (!) 140/57  (!) 127/49  Pulse:  66  78  Resp:  12  19  Temp: 97.7 F (36.5 C) 97.6 F (36.4 C)  97.9 F (36.6 C)  TempSrc: Oral Tympanic  Oral  SpO2:  94% 95% 98%  Weight:      Height:        GEN- The patient is well appearing, alert and oriented x 3 today.   Head- normocephalic, atraumatic Eyes-  Sclera clear, conjunctiva pink Ears- hearing intact Oropharynx- clear Neck- supple Lungs- Clear to ausculation bilaterally, normal work of breathing Heart- Regular rate and rhythm, no murmurs, rubs or gallops  GI- soft, NT, ND, + BS Extremities- no clubbing, cyanosis, or edema MS- no significant deformity or atrophy Skin- no rash or lesion Psych- euthymic mood, full affect   Labs:   Lab Results  Component Value Date   WBC 10.1 03/24/2020   HGB 12.0 03/24/2020   HCT 35.6 (L) 03/24/2020   MCV 84.2 03/24/2020   PLT 255 03/24/2020    Recent Labs  Lab 03/24/20 0428 03/25/20 0202 03/26/20 0142  NA 140   < > 137  K 3.0*   < > 4.2  CL 105   < > 105  CO2 24   < > 22  BUN 16   < > 11  CREATININE 0.73   < > 0.61  CALCIUM 9.4   < > 9.1  PROT 6.3*  --   --   BILITOT 0.5  --   --   ALKPHOS 64  --   --   ALT 52*  --   --   AST 33  --   --   GLUCOSE 164*   < > 224*   < > = values in this interval not displayed.   Lab Results  Component Value Date   CKTOTAL 42 POST-ULTRACENTRIFUGATION 02/25/2008   CKMB 0.5 02/25/2008   TROPONINI <0.30 02/09/2013   Lab Results  Component Value Date   CHOL 217 (H) 03/24/2020   CHOL (H) 02/26/2008    442        ATP III CLASSIFICATION:  <200     mg/dL   Desirable  200-239  mg/dL   Borderline High  >=240    mg/dL   High   Lab Results  Component Value Date   HDL 82 03/24/2020   HDL 41 02/26/2008   Lab Results   Component Value Date   LDLCALC 108 (H) 03/24/2020   LDLCALC  02/26/2008    UNABLE TO CALCULATE IF TRIGLYCERIDE OVER 400 mg/dL        Total Cholesterol/HDL:CHD Risk  Coronary Heart Disease Risk Table                     Men   Women  1/2 Average Risk   3.4   3.3   Lab Results  Component Value Date   TRIG 136 03/24/2020   TRIG 1038 RESULTS CONFIRMED BY MANUAL DILUTION (H) 02/26/2008   Lab Results  Component Value Date   CHOLHDL 2.6 03/24/2020   CHOLHDL 10.8 02/26/2008   No results found for: LDLDIRECT  No results found for: DDIMER   Radiology/Studies:   CT Head Wo Contrast Result Date: 03/23/2020 CLINICAL DATA:  Delirium. EXAM: CT HEAD WITHOUT CONTRAST TECHNIQUE: Contiguous axial images were obtained from the base of the skull through the vertex without intravenous contrast. COMPARISON:  June 17, 2018. FINDINGS: Brain: Low density is seen involving the left basal ganglia and periventricular white matter most consistent with acute infarction. Ventricular size is within normal limits. No midline shift is noted. No definite hemorrhage or mass lesion is noted. Vascular: No hyperdense vessel or unexpected calcification. Skull: Normal. Negative for fracture or focal lesion. Sinuses/Orbits: No acute finding. Other: None. IMPRESSION: Low density is seen involving the left basal ganglia and periventricular white matter most consistent with acute infarction. MRI is recommended for further evaluation. Electronically Signed   By: Marijo Conception M.D.   On: 03/23/2020 16:50     MR ANGIO HEAD WO CONTRAST Result Date: 03/25/2020 CLINICAL DATA:  Stroke follow-up EXAM: MRA HEAD WITHOUT CONTRAST TECHNIQUE: Angiographic images of the Circle of Willis were obtained using MRA technique without intravenous contrast. COMPARISON:  MRI head 03/24/2020 FINDINGS: Both vertebral arteries patent to the basilar. PICA patent bilaterally. Basilar widely patent. Superior cerebellar arteries patent bilaterally that  stenosis. Fetal origin left posterior cerebral artery widely patent. Mild stenosis right P1 segment. Internal carotid artery widely patent without stenosis or aneurysm. Anterior and middle cerebral arteries appear normal bilaterally without stenosis or aneurysm. IMPRESSION: No significant intracranial stenosis or large vessel occlusion. Mild stenosis right P1 segment. Electronically Signed   By: Franchot Gallo M.D.   On: 03/25/2020 17:55   MR BRAIN WO CONTRAST Result Date: 03/24/2020 CLINICAL DATA:  Stroke on CT EXAM: MRI HEAD WITHOUT CONTRAST TECHNIQUE: Multiplanar, multiecho pulse sequences of the brain and surrounding structures were obtained without intravenous contrast. COMPARISON:  2019, correlation made with CT from 03/23/2020 FINDINGS: Brain: There is mildly reduced diffusion involving the left caudate, lentiform nucleus, and adjacent internal capsule. No evidence of intracranial hemorrhage. There is no intracranial mass, mass effect, or edema. There is no hydrocephalus or extra-axial fluid collection. Chronic small vessel infarct of the posterior right thalamus. Ventricles and sulci are within normal limits. A few small foci of T2 hyperintensity in the supratentorial and pontine white matter are nonspecific but may reflect minor chronic microvascular ischemic changes. These findings have progressed since 2019. Vascular: Major vessel flow voids at the skull base are preserved. Skull and upper cervical spine: Normal marrow signal is preserved. Sinuses/Orbits: Minor mucosal thickening.  Orbits are unremarkable. Other: Sella is unremarkable.  Mastoid air cells are clear. IMPRESSION: Acute to subacute infarct involving the left basal ganglia and adjacent white matter. No hemorrhage. Chronic small right thalamic infarct. Minor chronic microvascular ischemic changes. Electronically Signed   By: Macy Mis M.D.   On: 03/24/2020 13:20    VAS US CAROTID Result Date: 03/26/2020 Carotid Arterial Duplex Study  Indications:  CVA, Syncope and Weakness. Risk Factors: Hypertension, hyperlipidemia, Diabetes. Performing  Technologist: Griffin Basil RCT RDMS  Examination Guidelines: A complete evaluation includes B-mode imaging, spectral Doppler, color Doppler, and power Doppler as needed of all accessible portions of each vessel. Bilateral testing is considered an integral part of a complete examination. Limited examinations for reoccurring indications may be performed as noted.  Right Carotid  Summary: Right Carotid: Velocities in the right ICA are consistent with a 1-39% stenosis. Left Carotid: Velocities in the left ICA are consistent with a 1-39% stenosis. Vertebrals: Bilateral vertebral arteries demonstrate antegrade flow. *See table(s) above for measurements and observations.  Electronically signed by Antony Contras MD on 03/26/2020 at 2:00:37 PM.    Final     12-lead ECG SR All prior EKG's in EPIC reviewed with no documented atrial fibrillation  Telemetry SR  Assessment and Plan:  1. Cryptogenic stroke The patient presents with cryptogenic stroke.  The patient has a TEE planned for this today.  I spoke at length with the patient and her daughter at bedside about monitoring for afib with either a 30 day event monitor or an implantable loop recorder.  Risks, benefits, and alteratives to implantable loop recorder were discussed with the patient today.   At this time, the patient states she prefers to pursue loop implant, her daughter is agreeable as well.   Wound care was reviewed with the patient (keep incision clean and dry for 3 days).  Wound check will be scheduled for the patient.  Please call with questions.   Renee Dyane Dustman, PA-C 03/27/2020   I have seen, examined the patient, and reviewed the above assessment and plan.  Changes to above are made where necessary.  On exam, RRR.  The patient has had embolic stroke of unknown cause.  I agree with Dr Leonie Man that ILR is indicated for long term  monitoring of her rhythm to evaluate for afib as a possible cause for her stroke. Risks and benefits to ILR including but not limited to bleeding and infection were discussed with the patient today prior to TEE and she wishes to proceed if TEE is unrevealing.  Co Sign: Thompson Grayer, MD 03/27/2020 1:56 PM

## 2020-03-27 NOTE — Discharge Instructions (Signed)
Heart monitor wound care instructions Keep incision clean and dry for 3 days. You can remove outer dressing tomorrow. Leave steri-strips (little pieces of tape) on until seen in the office for wound check appointment. Call the office [phone]) for redness, drainage, swelling, or fever.

## 2020-03-27 NOTE — CV Procedure (Signed)
TEE: Anesthesia:  Propofol  EF 60% Trivial MR Trivial AR No LAA thrombus No ASD/PFO negative bubble study Normal RV No effusion   See full report in [facility]  Jenkins Rouge MD [facility]

## 2020-03-27 NOTE — Progress Notes (Addendum)
STROKE TEAM PROGRESS NOTE   INTERVAL HISTORY Patient is sitting up comfortably in bed.  She she states her speech is improving but not back to baseline.  She has no new complaints.  Vital signs are stable. I spoke to her daughter [name] who stated patient had a fall last Sunday and has been confused since then with speech difficulty and feels stroke happened on Sunday 03/25/20 She yesterday decided not to participate in the sleep smart study but after discussion with her daughter today she is changing her mind and now willing.  Carotid ultrasound showed no significant extracranial stenosis.  She had TEE earlier today which showed no definite cardiac source of embolism or PFO.  She had loop recorder inserted by Dr. Allred.  Therapy team recommends inpatient rehab  OBJECTIVE Vitals:   03/26/20 1900 03/26/20 2100 03/26/20 2200 03/27/20 0000  BP:  (!) 123/58  121/68  Pulse: 74 67 64   Resp: 19 16 15   Temp:  97.6 F (36.4 C)  97.7 F (36.5 C)  TempSrc:  Oral  Oral  SpO2: 95% 95% 98%   Weight:      Height:       CBC:  Recent Labs  Lab 03/23/20 1605 03/23/20 1605 03/23/20 1746 03/24/20 0428  WBC 14.2*  --   --  10.1  NEUTROABS 6.9  --   --  4.7  HGB 13.0   < > 13.6 12.0  HCT 39.3   < > 40.0 35.6*  MCV 86.6  --   --  84.2  PLT 372  --   --  255   < > = values in this interval not displayed.   Basic Metabolic Panel:  Recent Labs  Lab 03/25/20 0202 03/26/20 0142  NA 137 137  K 3.6 4.2  CL 104 105  CO2 22 22  GLUCOSE 264* 224*  BUN 12 11  CREATININE 0.71 0.61  CALCIUM 9.5 9.1  MG 1.7  --    Lipid Panel:     Component Value Date/Time   CHOL 217 (H) 03/24/2020 0428   TRIG 136 03/24/2020 0428   HDL 82 03/24/2020 0428   CHOLHDL 2.6 03/24/2020 0428   VLDL 27 03/24/2020 0428   LDLCALC 108 (H) 03/24/2020 0428   HgbA1c:  Lab Results  Component Value Date   HGBA1C 7.6 (H) 03/25/2020   Urine Drug Screen:     Component Value Date/Time   LABOPIA NONE DETECTED 03/24/2020  0346   COCAINSCRNUR NONE DETECTED 03/24/2020 0346   LABBENZ POSITIVE (A) 03/24/2020 0346   AMPHETMU NONE DETECTED 03/24/2020 0346   THCU NONE DETECTED 03/24/2020 0346   LABBARB NONE DETECTED 03/24/2020 0346    Alcohol Level     Component Value Date/Time   ETH <11 02/09/2013 1619    IMAGING past 24h VAS US CAROTID  Result Date: 03/26/2020 Carotid Arterial Duplex Study Indications:  CVA, Syncope and Weakness. Risk Factors: Hypertension, hyperlipidemia, Diabetes. Performing Technologist: Vernon [place] RCT RDMS  Examination Guidelines: A complete evaluation includes B-mode imaging, spectral Doppler, color Doppler, and power Doppler as needed of all accessible portions of each vessel. Bilateral testing is considered an integral part of a complete examination. Limited examinations for reoccurring indications may be performed as noted.  Right Carotid Findings: +----------+--------+--------+--------+------------------+------------------+           PSV cm/sEDV cm/sStenosisPlaque DescriptionComments           +----------+--------+--------+--------+------------------+------------------+ CCA Prox  85      17                                                    +----------+--------+--------+--------+------------------+------------------+  CCA Distal77      19                                intimal thickening +----------+--------+--------+--------+------------------+------------------+ ICA Prox  98      38      1-39%                     intimal thickening +----------+--------+--------+--------+------------------+------------------+ ICA Distal87      33                                                   +----------+--------+--------+--------+------------------+------------------+ ECA       79      9                                                    +----------+--------+--------+--------+------------------+------------------+  +----------+--------+-------+--------+-------------------+           PSV cm/sEDV cmsDescribeArm Pressure (mmHG) +----------+--------+-------+--------+-------------------+ Subclavian132     9                                  +----------+--------+-------+--------+-------------------+ +---------+--------+--+--------+--+---------+ VertebralPSV cm/s67EDV cm/s16Antegrade +---------+--------+--+--------+--+---------+  Left Carotid Findings: +----------+--------+--------+--------+------------------+------------------+           PSV cm/sEDV cm/sStenosisPlaque DescriptionComments           +----------+--------+--------+--------+------------------+------------------+ CCA Prox  79      16                                                   +----------+--------+--------+--------+------------------+------------------+ CCA Distal74      21                                intimal thickening +----------+--------+--------+--------+------------------+------------------+ ICA Prox  87      33                                intimal thickening +----------+--------+--------+--------+------------------+------------------+ ICA Distal106     42                                                   +----------+--------+--------+--------+------------------+------------------+ ECA       76      12                                                   +----------+--------+--------+--------+------------------+------------------+ +----------+--------+--------+--------+-------------------+           PSV cm/sEDV cm/sDescribeArm Pressure (mmHG) +----------+--------+--------+--------+-------------------+ Subclavian108     6                                   +----------+--------+--------+--------+-------------------+ +---------+--------+--+--------+--+---------+  VertebralPSV cm/s61EDV cm/s20Antegrade +---------+--------+--+--------+--+---------+   Summary: Right Carotid: Velocities in  the right ICA are consistent with a 1-39% stenosis. Left Carotid: Velocities in the left ICA are consistent with a 1-39% stenosis. Vertebrals: Bilateral vertebral arteries demonstrate antegrade flow. *See table(s) above for measurements and observations.  Electronically signed by Antony Contras MD on 03/26/2020 at 2:00:37 PM.    Final      PHYSICAL EXAM   Blood pressure 121/68, pulse 64, temperature 97.7 F (36.5 C), temperature source Oral, resp. rate 15, height 5' 2"  (1.575 m), weight 95.2 kg, SpO2 98 %. Pleasant middle-age obese Caucasian lady not in distress Neurological Exam :  awake and alert..  She missed the month and her age.  Slow nonfluent speech but no aphasia with slight dysarthria. Follows simple verbal commands.  Comprehension appears intact.  Extraocular movements intact.  Pupils equally round and reactive.  Visual fields full to threat in all fields, no facial asymmetry with right lower facial weakness., facial sensation intact, hearing intact to voice, face symmetric, tongue midline, 5 out of 5 strength, tone and normal, bulk is normal, intact to light touch in all 4 extremities, finger-to-nose intact bilaterally no ataxia or dysmetria noted.  NIH stroke scale score 4.  Premorbid modified Rankin score 0  ASSESSMENT/PLAN Ms. Tamiya Colello Oviatt is a 54 y.o. female with history of DM2, migraines, HTN, HLD, anxiety/depression, GERD, DCHF, hypothyroidism, migraines, nonalcoholic fatty liver disease, OSA and chronic pain who presents to the hospital with a 3 week history of lethargy and confusion. She did not receive IV t-PA due to late presentation (>4.5 hours from time of onset).  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  MRI head - Acute to subacute infarct involving the left basal ganglia and  adjacent white matter. No hemorrhage. Chronic small right thalamic infarct. Minor chronic microvascular ischemic changes.   MRA head - ordered w/o contrast- Allergy to Lohexol (Omnipaque) iodinated contrast (anaphylaxis) -     Carotid Dopplers ; pending  2D echo: ejection fraction 60 to 65%.  No cardiac source embolism.  TEE 03/27/2020 normal  [employer] Virus 2 - negative  LDL - 108  HgbA1c - 7.6  UDS - benzodiazepine  VTE prophylaxis - Lovenox 40  aspirin 81 mg daily prior to admission per pt not missing any doses, now on aspirin 325 mg daily . Will decrease aspirin dose to 81 and add plavix x 3 weeks then plavix alone    Therapy recommendations: CLR  Disposition:  Pending  Hypertension  Home BP meds: Cozaar ; HCTZ ; Diltiazem ; Bystolic  Current BP meds: Diltiazem  Blood pressure somewhat low at times.  Out of permissive HTN time window per Dr Cheral Marker . Long-term BP goal normotensive  Hyperlipidemia  Home Lipid lowering medication: Lipitor 20 mg daily  LDL 108, goal < 70  Current lipid lowering medication: Lipitor 20 mg daily -> increased to 80 mg daily  Continue statin at discharge  Diabetes  Home diabetic meds: insulin  Current diabetic meds: insulin  HgbA1c 7.6, goal < 7.0 Recent Labs    03/26/20 1212 03/26/20 1635 03/26/20 2157  GLUCAP 254* 283* 164*    Other Stroke Risk Factors  Former cigarette smoker - quit 11 yrs ago  Obesity, Body mass index is 38.39 kg/m., recommend weight loss, diet and exercise  as appropriate   Hx stroke/TIA by imaging  Migraines  Obstructive sleep apnea, does not consistently use CPAP at home. Consider Brady study for pt. Dr. Leonie Man / research staff will followup for enrollment eliqibility      Congestive Heart Failure  Other Active Problems  Code status - Full code  Allergy to Lohexol (Omnipaque) iodinated contrast (anaphylaxis) - would need to discuss contrast with Radiology prior to  CTA    ALT - 55  Will defer to stroke team in the morning if they would like further workup including hypercoag panel.    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     Check ANA panel and anticardiolipin antibodies.  Patient appears to be at risk for sleep apnea and may benefit with consideration for possible participation in the sleep smart study.  She will be given information to review and decide.  Discussed with Dr. Maylene Roes and patient's daughter [name]..  Greater than 50% time during this 25-minute visit was spent in counseling and coordination of care about her cryptogenic stroke and discussion about sleep apnea and answering questions Antony Contras, MD  To contact Stroke Continuity provider, please refer to [email or web address] After hours, contact General Neurology

## 2020-03-27 NOTE — Consult Note (Addendum)
ELECTROPHYSIOLOGY CONSULT NOTE  Patient ID: Cheryl [name] MRN: [id number], DOB/AGE: 04/12/1966   Admit date: 03/23/2020 Date of Consult: 03/27/2020  Primary Physician: Aurea Graff, PA-C (Inactive) Primary Cardiologist: Dr. Johnsie Cancel  Reason for Consultation: Cryptogenic stroke ; recommendations regarding Implantable Loop Recorder, requested by Dr. Leonie Man  History of Present Illness Cheryl [name] was admitted on 03/23/2020 with progressive lethargy and confusion over a period of a few weeks  PMHx includes, DM, HTN, migraine HAs, anxiety/depression, GERD, hypothyroidism, OSA, fatty liver  Neurology notes:acute to subacute infarct involving the left basal ganglia, Dr. Leonie Man recommends TEE and loop if negative  .  she has undergone workup for stroke including echocardiogram and carotid dopplers.  The patient has [name] monitored on telemetry which has demonstrated sinus rhythm with no arrhythmias.  Inpatient stroke work-up is to be completed with a TEE.   Echocardiogram this admission demonstrated   IMPRESSIONS  1. Left ventricular ejection fraction, by estimation, is 60 to 65%. The  left ventricle has normal function. The left ventricle has no regional  wall motion abnormalities. There is moderate left ventricular hypertrophy.  Left ventricular diastolic  parameters are consistent with Grade I diastolic dysfunction (impaired  relaxation).  2. Right ventricular systolic function is normal. The right ventricular  size is normal.  3. The mitral valve is normal in structure. No evidence of mitral valve  regurgitation. No evidence of mitral stenosis.  4. The aortic valve is normal in structure. Aortic valve regurgitation is  not visualized. No aortic stenosis is present.  5. The inferior vena cava is normal in size with greater than 50%  respiratory variability, suggesting right atrial pressure of 3 mmHg.   Conclusion(s)/Recommendation(s): No intracardiac source of embolism   detected on this transthoracic study. A transesophageal echocardiogram is  recommended to exclude cardiac source of embolism if clinically indicated.    Lab work is reviewed.   Prior to admission, the patient denies chest pain, shortness of breath, dizziness, or syncope.  They are recovering from their stroke with plans it seems to home (family refusing rehab) at discharge.  In d/w the patient's daughter, understands discharge timing hopefully by tomorrow, preferring home health to an inpatient given COVID concerns.  She does have palpitations, these are somewhat sporadic, on/off with sometimes months in between and she had felt 2/2 BS changes or anxiety  Past Medical History:  Diagnosis Date  . Allergy   . Anxiety   . Asthma   . Cancer ([place])    melanoma 2015 upper Left arm   . Chronic airway obstruction, not elsewhere classified   . Chronic pain    "q where; herniated disc in my tailbone" (02/09/2013)  . DDD (degenerative disc disease)    CERVIAL AND LUMBAR  . Depression   . Edema   . Fatty liver disease, nonalcoholic   . Fibromyalgia    "severe" (02/09/2013)  . Gastroparesis    from DM and chronic narcotic use  . GERD (gastroesophageal reflux disease)   . Human parvovirus infection   . Hyperlipidemia   . Hypertension   . Hypothyroidism   . Interstitial cystitis   . Migraine headache    "weekly; worse lately" (02/09/2013)  . OSA (obstructive sleep apnea)    "have mask;; don't use it; no one came out to check it" (02/09/2013)  . Osteoporosis   . Peripheral neuropathy    "severe" (02/09/2013)  . 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  PONV (postoperative nausea and vomiting)   . Positive PPD   . Shortness of breath    "at anytime; it's gotten worse recently" (02/09/2013)  . Sleep apnea   . Type II diabetes mellitus ([place])   . Vitamin D deficiency   . Vocal cord dysfunction      Surgical History:  Past Surgical  History:  Procedure Laterality Date  . ABDOMINAL HYSTERECTOMY  1993  . ANTERIOR CERVICAL DECOMP/DISCECTOMY FUSION  2004  . arm surgery     left 2018  . BRONCHOSCOPY  06/28/2019  . CARPAL TUNNEL RELEASE Bilateral ?1980's  . CHOLECYSTECTOMY  ?1990  . COLONOSCOPY    . HIP SURGERY Right 2002   "did something to the tibula band" (02/09/2013)  . KNEE ARTHROSCOPY Left 1990's   "fell; clot behind knee cap had to be removed" (02/09/2013)  . NASAL SINUS SURGERY  1986; 1990's   "i've had 3" (02/09/2013)  . RIGHT/LEFT HEART CATH AND CORONARY ANGIOGRAPHY N/A 08/26/2017   Procedure: RIGHT/LEFT HEART CATH AND CORONARY ANGIOGRAPHY;  Surgeon: Belva Crome, MD;  Location: [facility];  Service: Cardiovascular;  Laterality: N/A;  . SHOULDER ARTHROSCOPY W/ ROTATOR CUFF REPAIR Right    "had to go deep in rotator cuff" (02/09/2013)  . VIDEO BRONCHOSCOPY Bilateral 06/28/2019   Procedure: VIDEO BRONCHOSCOPY WITH FLUORO;  Surgeon: Marshell Garfinkel, MD;  Location: [facility];  Service: Cardiopulmonary;  Laterality: Bilateral;     Medications Prior to Admission  Medication Sig Dispense Refill Last Dose  . albuterol (VENTOLIN HFA) 108 (90 Base) MCG/ACT inhaler INHALE 2 PUFFS EVERY 6 HOURS AS NEEDED FOR WHEEZING OR SHORTNESS OF BREATH. 18 g 2 Past Week at Unknown time  . atorvastatin (LIPITOR) 20 MG tablet Take 20 mg by mouth daily.   03/23/2020 at Unknown time  . azithromycin (ZITHROMAX) 250 MG tablet Take 250 mg by mouth as directed. Started on 03-19-20 for 5 day therapy. Patient on day 3.   03/23/2020 at Unknown time  . Cholecalciferol (VITAMIN D3) 1000 units CAPS Take 1,000 Units by mouth daily.   03/23/2020 at Unknown time  . diazepam (VALIUM) 10 MG tablet Take 10 mg by mouth 2 (two) times daily as needed (panic attacks.).    03/23/2020 at Unknown time  . diltiazem (CARDIZEM CD) 240 MG 24 hr capsule TAKE 1 CAPSULE BY MOUTH EVERY MORNING ON AN EMPTY STOMACH - [name] DENIED, FAXED DR Johnsie Cancel 10/22/15 SS  (Patient taking differently: Take 240 mg by mouth daily. ) 30 capsule 5 03/23/2020 at Unknown time  . FLUoxetine (PROZAC) 40 MG capsule Take 80 mg by mouth at bedtime.    03/23/2020 at Unknown time  . Fluticasone-Umeclidin-Vilant (TRELEGY ELLIPTA) 100-62.5-25 MCG/INH AEPB Inhale 1 puff into the lungs daily. 2 each 0 03/23/2020 at Unknown time  . gabapentin (NEURONTIN) 300 MG capsule Take 600 mg by mouth at bedtime.    03/23/2020 at Unknown time  . HUMULIN R U-500 KWIKPEN 500 UNIT/ML kwikpen Inject 100 Units into the skin daily.    03/23/2020 at Unknown time  . hydrochlorothiazide (HYDRODIURIL) 25 MG tablet Take 25 mg by mouth daily.   03/23/2020 at Unknown time  . hydrOXYzine (ATARAX/VISTARIL) 25 MG tablet Take 50 mg by mouth at bedtime.   03/23/2020 at Unknown time  . ibuprofen (ADVIL,MOTRIN) 800 MG tablet Take 800 mg by mouth every 8 (eight) hours as needed (pain).    Past Week at Unknown time  . ipratropium-albuterol (DUONEB) 0.5-2.5 (3) MG/3ML SOLN Take 33m by  nebulization every 4-6 hours if needed (Patient taking differently: Inhale 3 mLs into the lungs every 4 (four) hours as needed (wheezing/shortness of breath.). ) 1080 mL 3 03/23/2020 at Unknown time  . levothyroxine (SYNTHROID, LEVOTHROID) 75 MCG tablet Take 75 mcg by mouth daily before breakfast.   03/23/2020 at Unknown time  . liraglutide (VICTOZA) 18 MG/3ML SOPN Inject 1.2 mg into the skin every evening.    03/23/2020 at Unknown time  . losartan (COZAAR) 25 MG tablet Take 25 mg by mouth daily.   03/23/2020 at Unknown time  . methylPREDNISolone (MEDROL DOSEPAK) 4 MG TBPK tablet Take by mouth. Patient on dose pack. Patient on day 1 of therapy.   03/23/2020 at Unknown time  . metoCLOPramide (REGLAN) 10 MG tablet Take 10 mg by mouth 3 (three) times daily.   03/23/2020 at Unknown time  . nebivolol (BYSTOLIC) 10 MG tablet Take 1 tablet (10 mg total) by mouth daily. 90 tablet 3 03/23/2020 at 8a  . nystatin-triamcinolone (MYCOLOG II) cream Apply 1 application  topically 2 (two) times daily as needed (yeast in skin folds).    Past Week at Unknown time  . pantoprazole (PROTONIX) 40 MG tablet Take 1 tablet (40 mg total) by mouth 2 (two) times daily. 180 tablet 4 03/23/2020 at Unknown time  . pentosan polysulfate (ELMIRON) 100 MG capsule Take 100 mg by mouth 3 (three) times daily.   03/23/2020 at Unknown time  . promethazine (PHENERGAN) 25 MG tablet Take 25 mg by mouth 2 (two) times daily as needed for nausea.   03/23/2020 at Unknown time  . SUMAtriptan (IMITREX) 20 MG/ACT nasal spray Place 20 mg into the nose every 2 (two) hours as needed for migraine or headache. May repeat in 2 hours if headache persists or recurs.   Past Month at Unknown time  . tiZANidine (ZANAFLEX) 4 MG tablet Take 4 mg by mouth 3 (three) times daily. May hold one dose if needed to operate motor vehicle.   03/23/2020 at Unknown time  . metoCLOPramide (REGLAN) 5 MG tablet Take 1 tablet (5 mg total) by mouth 4 (four) times daily. 120 tablet 0   . omeprazole (PRILOSEC) 40 MG capsule Take 1 capsule (40 mg total) by mouth 2 (two) times daily. MUST HAVE OFFICE VISIT FOR FURTHER REFILLS (Patient not taking: Reported on 03/24/2020) 180 capsule 0 Not Taking at Unknown time  . ondansetron (ZOFRAN ODT) 4 MG disintegrating tablet Take 1 tablet (4 mg total) by mouth every 6 (six) hours as needed for nausea or vomiting. (Patient not taking: Reported on 03/24/2020) 30 tablet 1 Not Taking at Unknown time    Inpatient Medications:  . aspirin EC  81 mg Oral Daily  . atorvastatin  80 mg Oral Daily  . clopidogrel  75 mg Oral Daily  . diltiazem  240 mg Oral Daily  . enoxaparin (LOVENOX) injection  40 mg Subcutaneous Q24H  . FLUoxetine  80 mg Oral QHS  . fluticasone furoate-vilanterol  1 puff Inhalation Daily  . insulin aspart  0-15 Units Subcutaneous TID WC  . insulin aspart  0-5 Units Subcutaneous QHS  . insulin aspart  10 Units Subcutaneous TID WC  . insulin glargine  60 Units Subcutaneous Daily  .  levothyroxine  75 mcg Oral QAC breakfast  . nebivolol  10 mg Oral Daily  . pantoprazole  40 mg Oral BID  . umeclidinium bromide  1 puff Inhalation Daily    Allergies:  Allergies  Allergen Reactions  . Influenza Vaccines Anaphylaxis  and Swelling    Throat swelling, "flu" symptoms  . Iodinated Diagnostic Agents Shortness Of Breath, Nausea And Vomiting, Nausea Only and Rash  . Iodine-131 Shortness Of Breath, Nausea And Vomiting and Rash  . Iohexol Anaphylaxis, Hives and Swelling     Desc: hives,dyspnea; throat swelling; ok w/ premeds and omnipaque   . Levofloxacin Anaphylaxis and Hives  . Nucynta [Tapentadol Hydrochloride] Other (See Comments)    Hallucinations and  insomnia x3 days  . Pregabalin Other (See Comments)    (lyrica)Reaction-Syncope & unable to speak  . Sulfonamide Derivatives Anaphylaxis  . Vantin [Cefpodoxime] Anaphylaxis, Hives, Rash and Cough  . Vilazodone Hcl Other (See Comments)    (Viibryd) Hallucinations  . Amoxicillin Hives and Itching    Did it involve swelling of the face/tongue/throat, SOB, or low BP? No Did it involve sudden or severe rash/hives, skin peeling, or any reaction on the inside of your mouth or nose? Yes Did you need to seek medical attention at a hospital or doctor's office? No When did it last happen?3-4 years ago If all above answers are "NO", may proceed with cephalosporin use. .  . Biaxin [Clarithromycin] Hives  . Carbamazepine Itching    (Tegretol)  . Ciprofloxacin Hives  . Dilaudid [Hydromorphone Hcl] Itching and Rash    redness  . Iodides Nausea Only  . Percocet [Oxycodone-Acetaminophen] Rash    Social History   Socioeconomic History  . Marital status: Divorced    Spouse name: Not on file  . Number of children: Not on file  . Years of education: Not on file  . Highest education level: Not on file  Occupational History  . Not on file  Tobacco Use  . Smoking status: Former Smoker    Packs/day: 0.10    Years: 3.00    Pack  years: 0.30    Types: Cigarettes    Quit date: 2010    Years since quitting: 11.6  . Smokeless tobacco: Never Used  . Tobacco comment: only smokes in high school then every now and then  Vaping Use  . Vaping Use: Never used  Substance and Sexual Activity  . Alcohol use: No  . Drug use: No  . Sexual activity: Never  Other Topics Concern  . Not on file  Social History Narrative   Lives with daughter in a one story home.  On disability.  Education: college.     Social Determinants of Health   Financial Resource Strain:   . Difficulty of Paying Living Expenses: Not on file  Food Insecurity: No Food Insecurity  . Worried About Charity fundraiser in the Last Year: Never true  . Ran Out of Food in the Last Year: Never true  Transportation Needs: No Transportation Needs  . Lack of Transportation (Medical): No  . Lack of Transportation (Non-Medical): No  Physical Activity:   . Days of Exercise per Week: Not on file  . Minutes of Exercise per Session: Not on file  Stress:   . Feeling of Stress : Not on file  Social Connections:   . Frequency of Communication with Friends and Family: Not on file  . Frequency of Social Gatherings with Friends and Family: Not on file  . Attends Religious Services: Not on file  . Active Member of Clubs or Organizations: Not on file  . Attends Archivist Meetings: Not on file  . Marital Status: Not on file  Intimate Partner Violence:   . Fear of Current or Ex-Partner: Not on  file  . Emotionally Abused: Not on file  . Physically Abused: Not on file  . Sexually Abused: Not on file     Family History  Problem Relation Age of Onset  . Coronary artery disease Father   . Diabetes Brother   . Diabetes Brother   . Breast cancer Maternal Aunt   . Breast cancer Cousin   . Breast cancer Cousin   . Colon cancer Neg Hx   . Esophageal cancer Neg Hx   . Liver cancer Neg Hx   . Pancreatic cancer Neg Hx   . Rectal cancer Neg Hx   . Stomach  cancer Neg Hx       Review of Systems: All other systems reviewed and are otherwise negative except as noted above.  Physical Exam: Vitals:   03/27/20 0000 03/27/20 0735 03/27/20 0828 03/27/20 1109  BP: 121/68 (!) 140/57  (!) 127/49  Pulse:  66  78  Resp:  12  19  Temp: 97.7 F (36.5 C) 97.6 F (36.4 C)  97.9 F (36.6 C)  TempSrc: Oral Tympanic  Oral  SpO2:  94% 95% 98%  Weight:      Height:        GEN- The patient is well appearing, alert and oriented x 3 today.   Head- normocephalic, atraumatic Eyes-  Sclera clear, conjunctiva pink Ears- hearing intact Oropharynx- clear Neck- supple Lungs- Clear to ausculation bilaterally, normal work of breathing Heart- Regular rate and rhythm, no murmurs, rubs or gallops  GI- soft, NT, ND, + BS Extremities- no clubbing, cyanosis, or edema MS- no significant deformity or atrophy Skin- no rash or lesion Psych- euthymic mood, full affect   Labs:   Lab Results  Component Value Date   WBC 10.1 03/24/2020   HGB 12.0 03/24/2020   HCT 35.6 (L) 03/24/2020   MCV 84.2 03/24/2020   PLT 255 03/24/2020    Recent Labs  Lab 03/24/20 0428 03/25/20 0202 03/26/20 0142  NA 140   < > 137  K 3.0*   < > 4.2  CL 105   < > 105  CO2 24   < > 22  BUN 16   < > 11  CREATININE 0.73   < > 0.61  CALCIUM 9.4   < > 9.1  PROT 6.3*  --   --   BILITOT 0.5  --   --   ALKPHOS 64  --   --   ALT 52*  --   --   AST 33  --   --   GLUCOSE 164*   < > 224*   < > = values in this interval not displayed.   Lab Results  Component Value Date   CKTOTAL 42 POST-ULTRACENTRIFUGATION 02/25/2008   CKMB 0.5 02/25/2008   TROPONINI <0.30 02/09/2013   Lab Results  Component Value Date   CHOL 217 (H) 03/24/2020   CHOL (H) 02/26/2008    442        ATP III CLASSIFICATION:  <200     mg/dL   Desirable  200-239  mg/dL   Borderline High  >=240    mg/dL   High   Lab Results  Component Value Date   HDL 82 03/24/2020   HDL 41 02/26/2008   Lab Results   Component Value Date   LDLCALC 108 (H) 03/24/2020   LDLCALC  02/26/2008    UNABLE TO CALCULATE IF TRIGLYCERIDE OVER 400 mg/dL        Total Cholesterol/HDL:CHD Risk  Coronary Heart Disease Risk Table                     Men   Women  1/2 Average Risk   3.4   3.3   Lab Results  Component Value Date   TRIG 136 03/24/2020   TRIG 1038 RESULTS CONFIRMED BY MANUAL DILUTION (H) 02/26/2008   Lab Results  Component Value Date   CHOLHDL 2.6 03/24/2020   CHOLHDL 10.8 02/26/2008   No results found for: LDLDIRECT  No results found for: DDIMER   Radiology/Studies:   CT Head Wo Contrast Result Date: 03/23/2020 CLINICAL DATA:  Delirium. EXAM: CT HEAD WITHOUT CONTRAST TECHNIQUE: Contiguous axial images were obtained from the base of the skull through the vertex without intravenous contrast. COMPARISON:  June 17, 2018. FINDINGS: Brain: Low density is seen involving the left basal ganglia and periventricular white matter most consistent with acute infarction. Ventricular size is within normal limits. No midline shift is noted. No definite hemorrhage or mass lesion is noted. Vascular: No hyperdense vessel or unexpected calcification. Skull: Normal. Negative for fracture or focal lesion. Sinuses/Orbits: No acute finding. Other: None. IMPRESSION: Low density is seen involving the left basal ganglia and periventricular white matter most consistent with acute infarction. MRI is recommended for further evaluation. Electronically Signed   By: Marijo Conception M.D.   On: 03/23/2020 16:50     MR ANGIO HEAD WO CONTRAST Result Date: 03/25/2020 CLINICAL DATA:  Stroke follow-up EXAM: MRA HEAD WITHOUT CONTRAST TECHNIQUE: Angiographic images of the Circle of Willis were obtained using MRA technique without intravenous contrast. COMPARISON:  MRI head 03/24/2020 FINDINGS: Both vertebral arteries patent to the basilar. PICA patent bilaterally. Basilar widely patent. Superior cerebellar arteries patent bilaterally that  stenosis. Fetal origin left posterior cerebral artery widely patent. Mild stenosis right P1 segment. Internal carotid artery widely patent without stenosis or aneurysm. Anterior and middle cerebral arteries appear normal bilaterally without stenosis or aneurysm. IMPRESSION: No significant intracranial stenosis or large vessel occlusion. Mild stenosis right P1 segment. Electronically Signed   By: Franchot Gallo M.D.   On: 03/25/2020 17:55   MR BRAIN WO CONTRAST Result Date: 03/24/2020 CLINICAL DATA:  Stroke on CT EXAM: MRI HEAD WITHOUT CONTRAST TECHNIQUE: Multiplanar, multiecho pulse sequences of the brain and surrounding structures were obtained without intravenous contrast. COMPARISON:  2019, correlation made with CT from 03/23/2020 FINDINGS: Brain: There is mildly reduced diffusion involving the left caudate, lentiform nucleus, and adjacent internal capsule. No evidence of intracranial hemorrhage. There is no intracranial mass, mass effect, or edema. There is no hydrocephalus or extra-axial fluid collection. Chronic small vessel infarct of the posterior right thalamus. Ventricles and sulci are within normal limits. A few small foci of T2 hyperintensity in the supratentorial and pontine white matter are nonspecific but may reflect minor chronic microvascular ischemic changes. These findings have progressed since 2019. Vascular: Major vessel flow voids at the skull base are preserved. Skull and upper cervical spine: Normal marrow signal is preserved. Sinuses/Orbits: Minor mucosal thickening.  Orbits are unremarkable. Other: Sella is unremarkable.  Mastoid air cells are clear. IMPRESSION: Acute to subacute infarct involving the left basal ganglia and adjacent white matter. No hemorrhage. Chronic small right thalamic infarct. Minor chronic microvascular ischemic changes. Electronically Signed   By: Macy Mis M.D.   On: 03/24/2020 13:20    VAS US CAROTID Result Date: 03/26/2020 Carotid Arterial Duplex Study  Indications:  CVA, Syncope and Weakness. Risk Factors: Hypertension, hyperlipidemia, Diabetes. Performing  Technologist: Griffin Basil RCT RDMS  Examination Guidelines: A complete evaluation includes B-mode imaging, spectral Doppler, color Doppler, and power Doppler as needed of all accessible portions of each vessel. Bilateral testing is considered an integral part of a complete examination. Limited examinations for reoccurring indications may be performed as noted.  Right Carotid  Summary: Right Carotid: Velocities in the right ICA are consistent with a 1-39% stenosis. Left Carotid: Velocities in the left ICA are consistent with a 1-39% stenosis. Vertebrals: Bilateral vertebral arteries demonstrate antegrade flow. *See table(s) above for measurements and observations.  Electronically signed by Antony Contras MD on 03/26/2020 at 2:00:37 PM.    Final     12-lead ECG SR All prior EKG's in EPIC reviewed with no documented atrial fibrillation  Telemetry SR  Assessment and Plan:  1. Cryptogenic stroke The patient presents with cryptogenic stroke.  The patient has a TEE planned for this today.  I spoke at length with the patient and her daughter at bedside about monitoring for afib with either a 30 day event monitor or an implantable loop recorder.  Risks, benefits, and alteratives to implantable loop recorder were discussed with the patient today.   At this time, the patient states she prefers to pursue loop implant, her daughter is agreeable as well.   Wound care was reviewed with the patient (keep incision clean and dry for 3 days).  Wound check will be scheduled for the patient.  Please call with questions.   Renee Dyane Dustman, PA-C 03/27/2020   I have seen, examined the patient, and reviewed the above assessment and plan.  Changes to above are made where necessary.  On exam, RRR.  The patient has had embolic stroke of unknown cause.  I agree with Dr Leonie Man that ILR is indicated for long term  monitoring of her rhythm to evaluate for afib as a possible cause for her stroke. Risks and benefits to ILR including but not limited to bleeding and infection were discussed with the patient today prior to TEE and she wishes to proceed if TEE is unrevealing.  Co Sign: Thompson Grayer, MD 03/27/2020 1:56 PM

## 2020-03-27 NOTE — Progress Notes (Signed)
Diet resumed per MD.  Pt tolerated PO meds without difficultly- adjusted to 90 degrees, swallowed 1 pill at a time.

## 2020-03-27 NOTE — Transfer of Care (Signed)
Immediate Anesthesia Transfer of Care Note  Patient: Cheryl [name]  Procedure(s) Performed: TRANSESOPHAGEAL ECHOCARDIOGRAM (TEE) (N/A ) BUBBLE STUDY  Patient Location: Endoscopy Unit  Anesthesia Type:MAC  Level of Consciousness: drowsy  Airway & Oxygen Therapy: Patient Spontanous Breathing and Patient connected to nasal cannula oxygen  Post-op Assessment: Report given to RN and Post -op Vital signs reviewed and stable  Post vital signs: Reviewed and stable  Last Vitals:  Vitals Value Taken Time  BP 143/56 03/27/20 1335  Temp 36.5 C 03/27/20 1327  Pulse 67 03/27/20 1340  Resp 16 03/27/20 1340  SpO2 95 % 03/27/20 1340  Vitals shown include unvalidated device data.  Last Pain:  Vitals:   03/27/20 1327  TempSrc: Oral  PainSc: 0-No pain         Complications: No complications documented.

## 2020-03-27 NOTE — Progress Notes (Signed)
Physical Therapy Treatment Patient Details Name: Cheryl [name] MRN: [id number] DOB: Oct 31, 1965 Today's Date: 03/27/2020    History of Present Illness 54 yo female with onset of confusion and lethargy for 3 weeks was admitted to [facility].  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  PMHx:  DM, gastroparesis, asthma, HLD, HTN, fatty liver disease, osteoporosis, CHF, hypothyroidism, depression, GERD    PT Comments    Patient progressing well towards PT goals. Continues to have left sided weakness. Improved ambulation distance today with Min guard assist for balance/safety with left knee instability worsened with fatigue but no buckling. Continues to have difficulty with speech but trying to be more conversational today. Will continue to follow.    Follow Up Recommendations  CIR     Equipment Recommendations  None recommended by PT    Recommendations for Other Services       Precautions / Restrictions Precautions Precautions: Fall Precaution Comments: hx of incontinence Restrictions Weight Bearing Restrictions: No    Mobility  Bed Mobility               General bed mobility comments: Up in chair upon PT arrival.  Transfers Overall transfer level: Needs assistance Equipment used: Rolling walker (2 wheeled);None Transfers: Sit to/from Stand Sit to Stand: Min guard         General transfer comment: Min guard for safety. Stood from chair x3 for pericare due to incontinence with standing, from EOB x1.  Ambulation/Gait Ambulation/Gait assistance: Min guard Gait Distance (Feet): 100 Feet Assistive device: Rolling walker (2 wheeled) Gait Pattern/deviations: Step-through pattern;Decreased stride length;Trunk flexed Gait velocity: decreased   General Gait Details: Slow, mildly unsteady gait with left knee instability but no buckling. VSS.   Stairs             Wheelchair Mobility    Modified Rankin (Stroke  Patients Only) Modified Rankin (Stroke Patients Only) Pre-Morbid Rankin Score: No significant disability Modified Rankin: Moderate disability     Balance Overall balance assessment: Needs assistance Sitting-balance support: Feet supported;No upper extremity supported Sitting balance-Leahy Scale: Good     Standing balance support: During functional activity Standing balance-Leahy Scale: Poor Standing balance comment: needed single UE support for standing balance for pericare                            Cognition Arousal/Alertness: Awake/alert Behavior During Therapy: Flat affect Overall Cognitive Status: Impaired/Different from baseline Area of Impairment: Attention;Memory;Following commands;Orientation                   Current Attention Level: Selective Memory: Decreased short-term memory Following Commands: Follows one step commands consistently Safety/Judgement: Decreased awareness of safety;Decreased awareness of deficits Awareness: Intellectual Problem Solving: Slow processing General Comments: increased time to respond to questions/commands. Speaking a little more today. Oriented to place, month but no year.      Exercises      General Comments        Pertinent Vitals/Pain Pain Assessment: No/denies pain    Home Living                      Prior Function            PT Goals (current goals can now be found in the care plan section) Progress towards PT goals: Progressing toward goals    Frequency    Min 4X/week      PT  Plan Current plan remains appropriate    Co-evaluation              AM-PAC PT "6 Clicks" Mobility   Outcome Measure  Help needed turning from your back to your side while in a flat bed without using bedrails?: None Help needed moving from lying on your back to sitting on the side of a flat bed without using bedrails?: A Little Help needed moving to and from a bed to a chair (including a  wheelchair)?: A Little Help needed standing up from a chair using your arms (e.g., wheelchair or bedside chair)?: A Little Help needed to walk in hospital room?: A Little Help needed climbing 3-5 steps with a railing? : A Little 6 Click Score: 19    End of Session Equipment Utilized During Treatment: Gait belt Activity Tolerance: Patient tolerated treatment well Patient left: in chair;with call bell/phone within reach Nurse Communication: Mobility status PT Visit Diagnosis: Unsteadiness on feet (R26.81);Muscle weakness (generalized) (M62.81)     Time: 6761-9509 PT Time Calculation (min) (ACUTE ONLY): 24 min  Charges:  $Gait Training: 8-22 mins $Therapeutic Activity: 8-22 mins                     Marisa Severin, PT, DPT Acute Rehabilitation Services Pager [phone] Office [place] 03/27/2020, 10:32 AM

## 2020-03-27 NOTE — Interval H&P Note (Signed)
History and Physical Interval Note:  03/27/2020 12:39 PM  Cheryl [name]  has presented today for surgery, with the diagnosis of STROKE.  The various methods of treatment have been discussed with the patient and family. After consideration of risks, benefits and other options for treatment, the patient has consented to  Procedure(s): TRANSESOPHAGEAL ECHOCARDIOGRAM (TEE) (N/A) as a surgical intervention.  The patient's history has been reviewed, patient examined, no change in status, stable for surgery.  I have reviewed the patient's chart and labs.  Questions were answered to the patient's satisfaction.     Jenkins Rouge

## 2020-03-27 NOTE — Anesthesia Procedure Notes (Signed)
Procedure Name: MAC Date/Time: 03/27/2020 1:10 PM Performed by: Imagene Riches, CRNA Pre-anesthesia Checklist: Patient identified, Emergency Drugs available, Suction available, Patient being monitored and Timeout performed Patient Re-evaluated:Patient Re-evaluated prior to induction Oxygen Delivery Method: Nasal cannula

## 2020-03-27 NOTE — Progress Notes (Signed)
  Echocardiogram Echocardiogram Transesophageal has been performed.  Cheryl [name] 03/27/2020, 1:38 PM

## 2020-03-27 NOTE — Progress Notes (Signed)
SLP Cancellation Note  Patient Details Name: Cheryl [name] MRN: [id number] DOB: Nov 06, 1965   Cancelled treatment:       Reason Eval/Treat Not Completed: Patient at procedure or test/unavailable. Pt scheduled for TEE today. Will f/u for swallow eval if still needed.    Elgie Maziarz, Katherene Ponto 03/27/2020, 7:35 AM

## 2020-03-27 NOTE — Progress Notes (Signed)
PROGRESS NOTE    Filicia Scogin Roemer  ZOX:[id number] DOB: October 14, 1965 DOA: 03/23/2020 PCP: Aurea Graff, PA-C (Inactive)     Brief Narrative:  Jannet Calip Klippel is a 54 year old female with past medical history of IDDM type II, diabetic gastroparesis, asthma, gastroesophageal reflux disease, hyperlipidemia, hypertension, nonalcoholic fatty liver disease, osteoporosis, diastolic congestive heart failure(EF during cath 07/2017 65%),hypothyroidism who presents to [facility] emergency department due to a 3-week history of lethargy and confusion. Upon evaluation in the emergency department, patient was found to be in mild diabetic ketoacidosis and therefore was initiated on insulin infusion. Furthermore, basic work-up for patient's confusion included CT imaging of the head without contrast which revealed a left basal ganglia infarct with periventricular white matter ischemic changes concerning for stroke. Hospitalist group was contacted at [facility] for acceptance to the medical floor for continued work-up and treatment.  DKA resolved.  Further imaging with MRI confirmed an acute/subacute stroke.  Neurology was consulted.  New events last 24 hours / Subjective: Doing well, no complaints today   Assessment & Plan:   Principal Problem:   Acute metabolic encephalopathy Active Problems:   Essential hypertension   Asthma not dependent on systemic steroids without complication   GERD without esophagitis   Hypothyroidism   DKA, type 2 (HCC)   Stroke due to occlusion of left middle cerebral artery (HCC)   Mixed diabetic hyperlipidemia associated with type 2 diabetes mellitus ([place])   Acute metabolic encephalopathy -Likely secondary to DKA -UDS positive for benzo -Improved since admission   DKA -Hemoglobin A1c 7.6 -Transition off IV insulin -Lantus, NovoLog and NovoLog sliding scale insulin  -Diabetic coordinator consulted   Acute CVA  -CT head revealed low density is  seen involving the left basal ganglia and periventricular white matter most consistent with acute infarction. -MRI brain revealed acute to subacute infarct involving the left basal ganglia and adjacent white matter -Neurology following -PT recommending CIR versus SNF.  Discussed over the phone with daughter, due to ongoing Covid pandemic, she feels that patient would do better at home with home health physical therapy.  TOC consulted -Echocardiogram EF 60-65%, no intracardiac source of embolism -Aspirin, plavix, lipitor  -S/p TEE and loop recorder 8/31   Essential hypertension -Continue Cardizem, Bystolic  Hypothyroidism -Continue Synthroid  Hyperlipidemia -Continue Lipitor  GERD -Continue PPI  Depression -Continue Prozac   DVT prophylaxis:  enoxaparin (LOVENOX) injection 40 mg Start: 03/24/20 1000  Code Status: Full Family Communication: Discussed with daughter over the phone 8/30  Disposition Plan:   Status is: Inpatient  Remains inpatient appropriate because:Ongoing diagnostic testing needed not appropriate for outpatient work up   Dispo: The patient is from: Home              Anticipated d/c is to: Home              Anticipated d/c date is: 1 day              Patient currently is not medically stable to d/c. Pending neurology clearance for discharge, awaiting SLP swallow eval (could not complete today due to being off floor for procedures), home health on discharge (daughter declined CIR/SNF recommendation)    Consultants:   Neurology   Antimicrobials:  Anti-infectives (From admission, onward)   Start     Dose/Rate Route Frequency Ordered Stop   03/23/20 1715  fosfomycin (MONUROL) packet 3 g        3 g Oral  Once 03/23/20 1714 03/23/20 1747  Objective: Vitals:   03/27/20 1335 03/27/20 1345 03/27/20 1355 03/27/20 1628  BP: (!) 143/56 (!) 131/58 (!) 136/59 133/67  Pulse: 70 66 66 67  Resp: 16 15 14 14   Temp:    98.6 F (37 C)  TempSrc:     Oral  SpO2: 94% 93% 93% 94%  Weight:      Height:        Intake/Output Summary (Last 24 hours) at 03/27/2020 1844 Last data filed at 03/27/2020 1501 Gross per 24 hour  Intake 150 ml  Output 700 ml  Net -550 ml   Filed Weights   03/23/20 1558 03/24/20 0142  Weight: 90.7 kg 95.2 kg    Examination: General exam: Appears calm and comfortable  Respiratory system: Clear to auscultation. Respiratory effort normal. Cardiovascular system: S1 & S2 heard, RRR. No pedal edema. Gastrointestinal system: Abdomen is nondistended, soft and nontender. Normal bowel sounds heard. Central nervous system: Alert and oriented. Non focal exam. Speech clear  Extremities: Symmetric in appearance bilaterally  Skin: No rashes, lesions or ulcers on exposed skin  Psychiatry: Stable    Data Reviewed: I have personally reviewed following labs and imaging studies  CBC: Recent Labs  Lab 03/23/20 1605 03/23/20 1746 03/24/20 0428  WBC 14.2*  --  10.1  NEUTROABS 6.9  --  4.7  HGB 13.0 13.6 12.0  HCT 39.3 40.0 35.6*  MCV 86.6  --  84.2  PLT 372  --  893   Basic Metabolic Panel: Recent Labs  Lab 03/23/20 1605 03/23/20 1605 03/23/20 1746 03/23/20 2033 03/24/20 0428 03/25/20 0202 03/26/20 0142  NA 131*   < > 133* 133* 140 137 137  K 3.7   < > 3.8 3.2* 3.0* 3.6 4.2  CL 97*  --   --  101 105 104 105  CO2 18*  --   --  22 24 22 22   GLUCOSE 484*  --   --  191* 164* 264* 224*  BUN 36*  --   --  28* 16 12 11   CREATININE 1.44*  --   --  0.86 0.73 0.71 0.61  CALCIUM 9.1  --   --  9.0 9.4 9.5 9.1  MG  --   --   --   --   --  1.7  --    < > = values in this interval not displayed.   GFR: Estimated Creatinine Clearance: 87.4 mL/min (by C-G formula based on SCr of 0.61 mg/dL). Liver Function Tests: Recent Labs  Lab 03/23/20 1605 03/23/20 2033 03/24/20 0428  AST 50* 37 33  ALT 62* 55* 52*  ALKPHOS 71 65 64  BILITOT 0.6 0.5 0.5  PROT 7.4 7.1 6.3*  ALBUMIN 4.2 3.9 3.6   No results for input(s):  LIPASE, AMYLASE in the last 168 hours. Recent Labs  Lab 03/24/20 0428  AMMONIA 38*   Coagulation Profile: No results for input(s): INR, PROTIME in the last 168 hours. Cardiac Enzymes: No results for input(s): CKTOTAL, CKMB, CKMBINDEX, TROPONINI in the last 168 hours. BNP (last 3 results) No results for input(s): PROBNP in the last 8760 hours. HbA1C: Recent Labs    03/25/20 0202  HGBA1C 7.6*   CBG: Recent Labs  Lab 03/26/20 1635 03/26/20 2157 03/27/20 0551 03/27/20 1107 03/27/20 1627  GLUCAP 283* 164* 171* 212* 150*   Lipid Profile: No results for input(s): CHOL, HDL, LDLCALC, TRIG, CHOLHDL, LDLDIRECT in the last 72 hours. Thyroid Function Tests: No results for input(s): TSH, T4TOTAL, FREET4, T3FREE,  THYROIDAB in the last 72 hours. Anemia Panel: No results for input(s): VITAMINB12, FOLATE, FERRITIN, TIBC, IRON, RETICCTPCT in the last 72 hours. Sepsis Labs: No results for input(s): PROCALCITON, LATICACIDVEN in the last 168 hours.  Recent Results (from the past 240 hour(s))  SARS Coronavirus 2 by RT PCR (hospital order, performed in [facility] hospital lab) Nasopharyngeal Nasopharyngeal Swab     Status: None   Collection Time: 03/23/20  6:00 PM   Specimen: Nasopharyngeal Swab  Result Value Ref Range Status   SARS Coronavirus 2 NEGATIVE NEGATIVE Final    Comment: (NOTE) SARS-CoV-2 target nucleic acids are NOT DETECTED.  The SARS-CoV-2 RNA is generally detectable in upper and lower respiratory specimens during the acute phase of infection. The lowest concentration of SARS-CoV-2 viral copies this assay can detect is 250 copies / mL. A negative result does not preclude SARS-CoV-2 infection and should not be used as the sole basis for treatment or other patient management decisions.  A negative result may occur with improper specimen collection / handling, submission of specimen other than nasopharyngeal swab, presence of viral mutation(s) within the areas targeted by  this assay, and inadequate number of viral copies (<250 copies / mL). A negative result must be combined with clinical observations, patient history, and epidemiological information.  Fact Sheet for Patients:   StrictlyIdeas.no  Fact Sheet for Healthcare Providers: BankingDealers.co.za  This test is not yet approved or  cleared by the [place] FDA and has been authorized for detection and/or diagnosis of SARS-CoV-2 by FDA under an Emergency Use Authorization (EUA).  This EUA will remain in effect (meaning this test can be used) for the duration of the COVID-19 declaration under Section 564(b)(1) of the Act, 21 U.S.C. section 360bbb-3(b)(1), unless the authorization is terminated or revoked sooner.  Performed at [facility], [address], [place], [place] [address]       Radiology Studies: EP PPM/ICD IMPLANT  Result Date: 03/27/2020 SURGEON:  Thompson Grayer, MD   PREPROCEDURE DIAGNOSIS:  Cryptogenic Stroke   POSTPROCEDURE DIAGNOSIS:  Cryptogenic Stroke    PROCEDURES:  1. Implantable loop recorder implantation   INTRODUCTION:  Jenniger Figiel Mcbrearty is a 54 y.o. female with a history of unexplained stroke who presents today for implantable loop implantation.  The patient has had a cryptogenic stroke.  Despite an extensive workup by neurology, no reversible causes have been identified.  she has worn telemetry during which she did not have arrhythmias.  There is significant concern for possible atrial fibrillation as the cause for the patients stroke.  The patient therefore presents today for implantable loop implantation.   DESCRIPTION OF PROCEDURE:  Informed written consent was obtained.  The patient required no sedation for the procedure today.  The patients left chest was prepped and draped. Mapping over the patient's chest was performed to identify the appropriate ILR site.  This area was found to be the left  parasternal region  over the 3rd-4th intercostal space.  The skin overlying this region was infiltrated with lidocaine for local analgesia.  A 0.5-cm incision was made at the implant site.  A subcutaneous ILR pocket was fashioned using a combination of sharp and blunt dissection.  A Medtronic Reveal Linq model [id number] implantable loop recorder was then placed into the pocket R waves were very prominent and measured > 0.2 mV. EBL<1 ml.  Steri- Strips and a sterile dressing were then applied.  There were no early apparent complications.   CONCLUSIONS:  1. Successful  implantation of a Medtronic Reveal LINQ implantable loop recorder for cryptogenic stroke  2. No early apparent complications. Thompson Grayer MD, [facility] 03/27/2020 2:18 PM   ECHO TEE  Result Date: 03/27/2020    TRANSESOPHOGEAL ECHO REPORT   Patient Name:   CHERL GORNEY Date of Exam: 03/27/2020 Medical Rec #:  [id number]        Height:       62.0 in Accession #:    [id number]       Weight:       209.9 lb Date of Birth:  1965/09/13        BSA:          1.951 m Patient Age:    2 years         BP:           149/70 mmHg Patient Gender: F                HR:           77 bpm. Exam Location:  Inpatient Procedure: Transesophageal Echo, Color Doppler and Saline Contrast Bubble Study Indications:     Stroke  History:         Patient has prior history of Echocardiogram examinations, most                  recent 03/25/2020. Stroke, Signs/Symptoms:Shortness of Breath                  and Chest Pain; Risk Factors:Hypertension, Diabetes and Former                  Smoker. GERD.  Sonographer:     Vickie Epley RDCS Referring Phys:  [place] Diagnosing Phys: Jenkins Rouge MD PROCEDURE: The transesophogeal probe was passed without difficulty through the esophogus of the patient. Sedation performed by different physician. The patient was monitored while under deep sedation. Anesthestetic sedation was provided intravenously by Anesthesiology: 24m of Propofol. The patient's vital signs;  including heart rate, blood pressure, and oxygen saturation; remained stable throughout the procedure. The patient developed no complications during the procedure. IMPRESSIONS  1. Left ventricular ejection fraction, by estimation, is 60 to 65%. The left ventricle has normal function. The left ventricle has no regional wall motion abnormalities.  2. Right ventricular systolic function is normal. The right ventricular size is normal.  3. No left atrial/left atrial appendage thrombus was detected.  4. The mitral valve is normal in structure. Trivial mitral valve regurgitation. No evidence of mitral stenosis.  5. The aortic valve is normal in structure. Aortic valve regurgitation is trivial. No aortic stenosis is present.  6. The inferior vena cava is normal in size with greater than 50% respiratory variability, suggesting right atrial pressure of 3 mmHg.  7. Agitated saline contrast bubble study was negative, with no evidence of any interatrial shunt. Conclusion(s)/Recommendation(s): Normal biventricular function without evidence of hemodynamically significant valvular heart disease. FINDINGS  Left Ventricle: Left ventricular ejection fraction, by estimation, is 60 to 65%. The left ventricle has normal function. The left ventricle has no regional wall motion abnormalities. The left ventricular internal cavity size was normal in size. There is  no left ventricular hypertrophy. Right Ventricle: The right ventricular size is normal. No increase in right ventricular wall thickness. Right ventricular systolic function is normal. Left Atrium: Left atrial size was normal in size. No left atrial/left atrial appendage thrombus was detected. Right Atrium: Right atrial size was normal in size. Pericardium:  There is no evidence of pericardial effusion. Mitral Valve: The mitral valve is normal in structure. Normal mobility of the mitral valve leaflets. Trivial mitral valve regurgitation. No evidence of mitral valve stenosis.  Tricuspid Valve: The tricuspid valve is normal in structure. Tricuspid valve regurgitation is trivial. No evidence of tricuspid stenosis. Aortic Valve: The aortic valve is normal in structure. Aortic valve regurgitation is trivial. No aortic stenosis is present. Pulmonic Valve: The pulmonic valve was normal in structure. Pulmonic valve regurgitation is not visualized. No evidence of pulmonic stenosis. Aorta: The aortic root is normal in size and structure. Venous: The inferior vena cava is normal in size with greater than 50% respiratory variability, suggesting right atrial pressure of 3 mmHg. IAS/Shunts: No atrial level shunt detected by color flow Doppler. Agitated saline contrast was given intravenously to evaluate for intracardiac shunting. Agitated saline contrast bubble study was negative, with no evidence of any interatrial shunt. Jenkins Rouge MD Electronically signed by Jenkins Rouge MD Signature Date/Time: 03/27/2020/1:27:41 PM    Final    VAS US CAROTID  Result Date: 03/26/2020 Carotid Arterial Duplex Study Indications:  CVA, Syncope and Weakness. Risk Factors: Hypertension, hyperlipidemia, Diabetes. Performing Technologist: Griffin Basil RCT RDMS  Examination Guidelines: A complete evaluation includes B-mode imaging, spectral Doppler, color Doppler, and power Doppler as needed of all accessible portions of each vessel. Bilateral testing is considered an integral part of a complete examination. Limited examinations for reoccurring indications may be performed as noted.  Right Carotid Findings: +----------+--------+--------+--------+------------------+------------------+           PSV cm/sEDV cm/sStenosisPlaque DescriptionComments           +----------+--------+--------+--------+------------------+------------------+ CCA Prox  85      17                                                   +----------+--------+--------+--------+------------------+------------------+ CCA Distal77      19                                 intimal thickening +----------+--------+--------+--------+------------------+------------------+ ICA Prox  98      38      1-39%                     intimal thickening +----------+--------+--------+--------+------------------+------------------+ ICA Distal87      33                                                   +----------+--------+--------+--------+------------------+------------------+ ECA       79      9                                                    +----------+--------+--------+--------+------------------+------------------+ +----------+--------+-------+--------+-------------------+           PSV cm/sEDV cmsDescribeArm Pressure (mmHG) +----------+--------+-------+--------+-------------------+ Subclavian132     9                                  +----------+--------+-------+--------+-------------------+ +---------+--------+--+--------+--+---------+  VertebralPSV cm/s67EDV cm/s16Antegrade +---------+--------+--+--------+--+---------+  Left Carotid Findings: +----------+--------+--------+--------+------------------+------------------+           PSV cm/sEDV cm/sStenosisPlaque DescriptionComments           +----------+--------+--------+--------+------------------+------------------+ CCA Prox  79      16                                                   +----------+--------+--------+--------+------------------+------------------+ CCA Distal74      21                                intimal thickening +----------+--------+--------+--------+------------------+------------------+ ICA Prox  87      33                                intimal thickening +----------+--------+--------+--------+------------------+------------------+ ICA Distal106     42                                                   +----------+--------+--------+--------+------------------+------------------+ ECA       76      12                                                    +----------+--------+--------+--------+------------------+------------------+ +----------+--------+--------+--------+-------------------+           PSV cm/sEDV cm/sDescribeArm Pressure (mmHG) +----------+--------+--------+--------+-------------------+ Subclavian108     6                                   +----------+--------+--------+--------+-------------------+ +---------+--------+--+--------+--+---------+ VertebralPSV cm/s61EDV cm/s20Antegrade +---------+--------+--+--------+--+---------+   Summary: Right Carotid: Velocities in the right ICA are consistent with a 1-39% stenosis. Left Carotid: Velocities in the left ICA are consistent with a 1-39% stenosis. Vertebrals: Bilateral vertebral arteries demonstrate antegrade flow. *See table(s) above for measurements and observations.  Electronically signed by Antony Contras MD on 03/26/2020 at 2:00:37 PM.    Final       Scheduled Meds: . aspirin EC  81 mg Oral Daily  . atorvastatin  80 mg Oral Daily  . clopidogrel  75 mg Oral Daily  . diltiazem  240 mg Oral Daily  . enoxaparin (LOVENOX) injection  40 mg Subcutaneous Q24H  . FLUoxetine  80 mg Oral QHS  . fluticasone furoate-vilanterol  1 puff Inhalation Daily  . insulin aspart  0-15 Units Subcutaneous TID WC  . insulin aspart  0-5 Units Subcutaneous QHS  . insulin aspart  10 Units Subcutaneous TID WC  . insulin glargine  60 Units Subcutaneous Daily  . levothyroxine  75 mcg Oral QAC breakfast  . losartan  25 mg Oral Daily  . metoCLOPramide  10 mg Oral TID  . nebivolol  10 mg Oral Daily  . pantoprazole  40 mg Oral BID  . umeclidinium bromide  1 puff Inhalation Daily   Continuous Infusions:   LOS: 3 days      Time spent: 25 minutes  Dessa Phi, DO [facility] 03/27/2020, 6:44 PM   Available via Epic secure chat 7am-7pm After these hours, please refer to coverage provider listed on [email or web address]

## 2020-03-27 NOTE — Anesthesia Preprocedure Evaluation (Signed)
Anesthesia Evaluation  Patient identified by MRN, date of birth, ID band Patient awake    Reviewed: Allergy & Precautions, NPO status , Patient's Chart, lab work & pertinent test results, reviewed documented beta blocker date and time   History of Anesthesia Complications (+) PONV and history of anesthetic complications  Airway Mallampati: I  TM Distance: >3 FB Neck ROM: Full    Dental  (+) Missing, Dental Advisory Given, Teeth Intact,    Pulmonary asthma , sleep apnea , COPD,  COPD inhaler, former smoker,  Covid-19 Nucleic Acid Test Results Lab Results      Component                Value               Date                      SARSCOV2NAA              NEGATIVE            03/23/2020                [place]              NEGATIVE            06/25/2019                [place]              Not Detected        05/27/2019           Denies cpap   breath sounds clear to auscultation       Cardiovascular hypertension, Pt. on medications and Pt. on home beta blockers (-) angina(-) Past MI and (-) CHF  Rhythm:Regular     Neuro/Psych  Headaches, PSYCHIATRIC DISORDERS Anxiety Depression  Neuromuscular disease CVA    GI/Hepatic GERD  Medicated and Controlled,  Endo/Other  diabetes, Type 2Hypothyroidism   Renal/GU      Musculoskeletal  (+) Arthritis , Fibromyalgia -  Abdominal   Peds  Hematology  (+) Blood dyscrasia, anemia , Lab Results      Component                Value               Date                      WBC                      10.1                03/24/2020                HGB                      12.0                03/24/2020                HCT                      35.6 (L)            03/24/2020                MCV  84.2                03/24/2020                PLT                      255                 03/24/2020           Lab Results      Component                Value               Date                       INR                      1.0                 08/21/2017                INR                      1.00                02/09/2013                INR                      1.1 RATIO           01/15/2007              Anesthesia Other Findings   Reproductive/Obstetrics                             Anesthesia Physical Anesthesia Plan  ASA: III  Anesthesia Plan: MAC   Post-op Pain Management:    Induction: Intravenous  PONV Risk Score and Plan: 3 and Propofol infusion and Treatment may vary due to age or medical condition  Airway Management Planned: Nasal Cannula  Additional Equipment: None  Intra-op Plan:   Post-operative Plan:   Informed Consent: I have reviewed the patients History and Physical, chart, labs and discussed the procedure including the risks, benefits and alternatives for the proposed anesthesia with the patient or authorized representative who has indicated his/her understanding and acceptance.     Dental advisory given  Plan Discussed with: CRNA and Surgeon  Anesthesia Plan Comments:         Anesthesia Quick Evaluation

## 2020-03-28 ENCOUNTER — Ambulatory Visit: Payer: Self-pay

## 2020-03-28 ENCOUNTER — Encounter (HOSPITAL_COMMUNITY): Payer: Self-pay | Admitting: Internal Medicine

## 2020-03-28 ENCOUNTER — Inpatient Hospital Stay (HOSPITAL_COMMUNITY): Payer: Medicare HMO

## 2020-03-28 LAB — GLUCOSE, CAPILLARY
Glucose-Capillary: 211 mg/dL — ABNORMAL HIGH (ref 70–99)
Glucose-Capillary: 315 mg/dL — ABNORMAL HIGH (ref 70–99)
Glucose-Capillary: 274 mg/dL — ABNORMAL HIGH (ref 70–99)
Glucose-Capillary: 190 mg/dL — ABNORMAL HIGH (ref 70–99)

## 2020-03-28 MED ORDER — INSULIN GLARGINE 100 UNIT/ML ~~LOC~~ SOLN
70.0000 [IU] | Freq: Every day | SUBCUTANEOUS | Status: DC
  Administered 2020-03-29: 70 [IU] via SUBCUTANEOUS
  Filled 2020-03-28: qty 0.7

## 2020-03-28 MED ORDER — INSULIN ASPART 100 UNIT/ML ~~LOC~~ SOLN
12.0000 [IU] | Freq: Three times a day (TID) | SUBCUTANEOUS | Status: DC
  Administered 2020-03-28 – 2020-03-29 (×2): 12 [IU] via SUBCUTANEOUS

## 2020-03-28 NOTE — Progress Notes (Signed)
Occupational Therapy Treatment Patient Details Name: Cheryl [name] MRN: [id number] DOB: 1965-11-10 Today's Date: 03/28/2020    History of present illness 54 yo female with onset of confusion and lethargy for 3 weeks was admitted to [facility].  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  PMHx:  DM, gastroparesis, asthma, HLD, HTN, fatty liver disease, osteoporosis, CHF, hypothyroidism, depression, GERD   OT comments  Pt presents seated in recliner pleasant and willing to participate in therapy session. Pt performing room level mobility using RW, LB and standing grooming ADL with minguard assist throughout. Pt with x1 minor LOB when returning to recliner requiring minA to correct. She continues to present with delayed processing/response time but remains motivated to work and progress with therapy. VSS throughout. Further educated/performed LUE HEP for continued strengthening given current L side weakness. Have updated d/c recommendations as noted per chart pt's family with preference for home (declining CIR). Recommend pt have initial 24hr supervision and HHOT services at time of discharge. Will continue to follow acutely.   Follow Up Recommendations  Home health OT;Supervision/Assistance - 24 hour    Equipment Recommendations  3 in 1 bedside commode          Precautions / Restrictions Precautions Precautions: Fall Precaution Comments: hx of incontinence Restrictions Weight Bearing Restrictions: No       Mobility Bed Mobility               General bed mobility comments: OOB in recliner   Transfers Overall transfer level: Needs assistance Equipment used: Rolling walker (2 wheeled);None Transfers: Sit to/from Stand Sit to Stand: Min guard         General transfer comment: for balance and safety, x2 from recliner    Balance Overall balance assessment: Needs assistance Sitting-balance support: Feet supported;No upper  extremity supported Sitting balance-Leahy Scale: Good     Standing balance support: Bilateral upper extremity supported;During functional activity Standing balance-Leahy Scale: Fair Standing balance comment: able to statically stand at sink to wash hands with minguard for balance                            ADL either performed or assessed with clinical judgement   ADL Overall ADL's : Needs assistance/impaired     Grooming: Wash/dry hands;Min guard;Standing Grooming Details (indicate cue type and reason): minguard for balance              Lower Body Dressing: Min guard;Sit to/from stand               Functional mobility during ADLs: Min guard;Rolling walker General ADL Comments: pt with x1 LOB when returning to recliner, minA to steady     Cognition Arousal/Alertness: Awake/alert Behavior During Therapy: Flat affect;WFL for tasks assessed/performed Overall Cognitive Status: Impaired/Different from baseline Area of Impairment: Attention;Memory;Following commands                   Current Attention Level: Selective Memory: Decreased short-term memory Following Commands: Follows one step commands consistently;Follows one step commands with increased time     Problem Solving: Slow processing          Exercises Exercises: General Upper Extremity;Hand exercises General Exercises - Upper Extremity Shoulder Flexion: AAROM;Left;10 reps;Other (comment) (to 75* given shoulder pain) Elbow Flexion: AROM;Left;10 reps Elbow Extension: AROM;Left;10 reps Digit Composite Flexion: AROM;Left Composite Extension: AROM;Left Hand Exercises Forearm Supination: AROM;Left;5 reps Forearm Pronation: AROM;Left;5 reps Other Exercises Other  Exercises: lap slides, LUE, x10   Shoulder Instructions       General Comments      Pertinent Vitals/ Pain       Pain Assessment: No/denies pain  Home Living                                           Prior Functioning/Environment              Frequency  Min 2X/week        Progress Toward Goals  OT Goals(current goals can now be found in the care plan section)  Progress towards OT goals: Progressing toward goals  Acute Rehab OT Goals Patient Stated Goal: to get well enough to play with her granddaughter  OT Goal Formulation: With patient Time For Goal Achievement: 04/08/20 Potential to Achieve Goals: Good ADL Goals Pt Will Perform Grooming: with supervision;sitting;standing Pt Will Perform Lower Body Bathing: with supervision;sitting/lateral leans;sit to/from stand Pt Will Perform Upper Body Dressing: with supervision;sitting Pt Will Perform Lower Body Dressing: with supervision;sit to/from stand;sitting/lateral leans Pt Will Transfer to Toilet: with supervision;ambulating Pt Will Perform Toileting - Clothing Manipulation and hygiene: with supervision;sit to/from stand;sitting/lateral leans Additional ADL Goal #1: Pt will demonstrate anticipatory awareness during ADL/functional task.  Plan Discharge plan needs to be updated    Co-evaluation                 AM-PAC OT "6 Clicks" Daily Activity     Outcome Measure   Help from another person eating meals?: A Little Help from another person taking care of personal grooming?: A Little Help from another person toileting, which includes using toliet, bedpan, or urinal?: A Little Help from another person bathing (including washing, rinsing, drying)?: A Little Help from another person to put on and taking off regular upper body clothing?: A Little Help from another person to put on and taking off regular lower body clothing?: A Little 6 Click Score: 18    End of Session Equipment Utilized During Treatment: Gait belt;Rolling walker  OT Visit Diagnosis: Unsteadiness on feet (R26.81);Muscle weakness (generalized) (M62.81);Other symptoms and signs involving cognitive function   Activity Tolerance Patient tolerated  treatment well   Patient Left in chair;with call bell/phone within reach   Nurse Communication Mobility status        Time: 6825-7493 OT Time Calculation (min): 18 min  Charges: OT General Charges $OT Visit: 1 Visit OT Treatments $Self Care/Home Management : 8-22 mins  Lou Cal, OT Acute Rehabilitation Services Pager [phone] Office [place] 03/28/2020, 4:05 PM

## 2020-03-28 NOTE — Progress Notes (Signed)
PROGRESS NOTE  Cheryl [name]  DOB: [birth year]  PCP: Aurea Graff, PA-C (Inactive) ELF:[id number]  DOA: 03/23/2020  LOS: 4 days   Chief Complaint  Patient presents with  . Altered Mental Status    Brief narrative: Cheryl [name] is a 54 y.o. female with PMH of DM2, HTN, HLD, diastolic CHF, GERD, gastroparesis, asthma, nonalcoholic fatty liver disease, osteoporosis, hypothyroidism. Patient presented to the ED on 03/23/2020 with complaint of progressively worsening lethargy and confusion for 3 weeks. In the ED, patient was noted to be in DKA and was started on insulin infusion per DKA protocol. CT scan of head also showed a left basal ganglia infarct with periventricular white matter ischemic changes concerning for stroke. MRI confirmed acute/subacute stroke. Patient was admitted to hospitalist service for further evaluation management.  Neurology consultation was obtained.  Subjective: Patient was seen and examined this morning together with neurology team.  Pleasant middle-aged Caucasian female.  Propped up in bed.  Not in distress.  Continues to have mild dysarthria. Last night patient was assessed for sleep apnea.  She qualifies to be started on a mask at night.  Assessment/Plan: Acute metabolic encephalopathy -Likely secondary to DKA and a stroke. -UDS positive for benzo -Mental status improved now.  Acute CVA  -CT headrevealed low density is seen involving the left basal ganglia and periventricular white matter most consistent with acute infarction. -MRI brain revealed acute to subacute infarct involving the left basal ganglia and adjacent white matter -Neurology recommended appreciated. -PT recommending CIR. -Echocardiogram EF 60-65%, no intracardiac source of embolism -Patient was on aspirin 81 mg daily prior to admission Per neurology, patient is currently on aspirin 81 mg daily and Plavix 75 mg daily.  After 3 weeks, patient will take Plavix alone. -s/p TEE and  loop recorder 8/31  DKA -Patient was in DKA on admission.  Managed with insulin infusion, IV fluids per DKA protocol.  -Subsequently transitioned to subcutaneous insulin.   Diabetes mellitus type 2 -A1c 7.6 on 8/29. -Currently on Lantus 60 units daily, NovoLog 10 units 3 times daily along with sliding scale insulin. -Blood sugar level still remains elevated. Was 211 this morning. -We will increase NovoLog to 12 units 3 times daily and Lantus to 70 units daily. Recent Labs  Lab 03/27/20 1107 03/27/20 1627 03/27/20 2129 03/28/20 0605 03/28/20 1108  GLUCAP 212* 150* 288* 211* 315*   Obstructive sleep apnea -Patient enrolled in sleep smart study in the hospital.  She was diagnosed with sleep apnea per study protocol.  She is getting fitted for mask tonight.  Essential hypertension -Continue Cardizem 585 mg daily, Bystolic 10 mg daily, losartan 25 mg daily,  Hypothyroidism -Continue Synthroid  Hyperlipidemia -Continue Lipitor  GERD -Continue PPI  Depression -Continue Prozac  Mobility: PT eval obtained Code Status:   Code Status: Full Code  Nutritional status: Body mass index is 38.39 kg/m.     Diet Order            Diet heart healthy/carb modified Room service appropriate? No; Fluid consistency: Thin  Diet effective now                 DVT prophylaxis: enoxaparin (LOVENOX) injection 40 mg Start: 03/24/20 1000   Antimicrobials:  None Fluid: None Consultants: Neurology Family Communication:  None at bedside  Status is: Inpatient  Remains inpatient appropriate because: Patient getting sleep apnea mask fit tonight.  She is getting insulin dose adjustment.   Dispo:  Patient From: Home  Planned Disposition:  Home with Health Care Svc  Expected discharge date: In 1 to 2 days  medically stable for discharge: No   Infusions:    Scheduled Meds: . aspirin EC  81 mg Oral Daily  . atorvastatin  80 mg Oral Daily  . clopidogrel  75 mg Oral Daily  .  diltiazem  240 mg Oral Daily  . enoxaparin (LOVENOX) injection  40 mg Subcutaneous Q24H  . FLUoxetine  80 mg Oral QHS  . fluticasone furoate-vilanterol  1 puff Inhalation Daily  . insulin aspart  0-15 Units Subcutaneous TID WC  . insulin aspart  0-5 Units Subcutaneous QHS  . insulin aspart  10 Units Subcutaneous TID WC  . insulin glargine  60 Units Subcutaneous Daily  . levothyroxine  75 mcg Oral QAC breakfast  . losartan  25 mg Oral Daily  . metoCLOPramide  10 mg Oral TID  . nebivolol  10 mg Oral Daily  . pantoprazole  40 mg Oral BID  . umeclidinium bromide  1 puff Inhalation Daily    Antimicrobials: Anti-infectives (From admission, onward)   Start     Dose/Rate Route Frequency Ordered Stop   03/23/20 1715  fosfomycin (MONUROL) packet 3 g        3 g Oral  Once 03/23/20 1714 03/23/20 1747      PRN meds: acetaminophen **OR** [DISCONTINUED] acetaminophen (TYLENOL) oral liquid 160 mg/5 mL **OR** [DISCONTINUED] acetaminophen, dextrose, ipratropium-albuterol, promethazine   Objective: Vitals:   03/28/20 0826 03/28/20 1131  BP:  110/90  Pulse:  77  Resp:  15  Temp:  98.1 F (36.7 C)  SpO2: 93% 93%    Intake/Output Summary (Last 24 hours) at 03/28/2020 1450 Last data filed at 03/28/2020 1329 Gross per 24 hour  Intake 840 ml  Output 2000 ml  Net -1160 ml   Filed Weights   03/23/20 1558 03/24/20 0142  Weight: 90.7 kg 95.2 kg   Weight change:  Body mass index is 38.39 kg/m.   Physical Exam: General exam: Appears calm and comfortable.  Not in physical distress Skin: No rashes, lesions or ulcers. HEENT: Atraumatic, normocephalic, supple neck, no obvious bleeding Lungs: Clear to auscultation bilaterally CVS: Regular rate and rhythm, no murmur GI/Abd soft, nontender nondistended, bowel sound present CNS: Alert, awake, oriented x3, slow to respond, dysarthria present. Psychiatry: Mood appropriate Extremities: No pedal edema, no calf tenderness  Data Review: I have  personally reviewed the laboratory data and studies available.  Recent Labs  Lab 03/23/20 1605 03/23/20 1746 03/24/20 0428  WBC 14.2*  --  10.1  NEUTROABS 6.9  --  4.7  HGB 13.0 13.6 12.0  HCT 39.3 40.0 35.6*  MCV 86.6  --  84.2  PLT 372  --  255   Recent Labs  Lab 03/23/20 1605 03/23/20 1605 03/23/20 1746 03/23/20 2033 03/24/20 0428 03/25/20 0202 03/26/20 0142  NA 131*   < > 133* 133* 140 137 137  K 3.7   < > 3.8 3.2* 3.0* 3.6 4.2  CL 97*  --   --  101 105 104 105  CO2 18*  --   --  22 24 22 22   GLUCOSE 484*  --   --  191* 164* 264* 224*  BUN 36*  --   --  28* 16 12 11   CREATININE 1.44*  --   --  0.86 0.73 0.71 0.61  CALCIUM 9.1  --   --  9.0 9.4 9.5 9.1  MG  --   --   --   --   --  1.7  --    < > = values in this interval not displayed.   Signed, Terrilee Croak, MD [facility] Pager: [phone] (Secure Chat preferred). 03/28/2020

## 2020-03-28 NOTE — Evaluation (Signed)
Clinical/Bedside Swallow Evaluation Patient Details  Name: Cheryl [name] MRN: [id number] Date of Birth: [birth year]  Today's Date: 03/28/2020 Time: SLP Start Time (ACUTE ONLY): 1021 SLP Stop Time (ACUTE ONLY): 1035 SLP Time Calculation (min) (ACUTE ONLY): 14 min  Past Medical History:  Past Medical History:  Diagnosis Date  . Allergy   . Anxiety   . Asthma   . Cancer ([place])    melanoma 2015 upper Left arm   . Chronic airway obstruction, not elsewhere classified   . Chronic pain    "q where; herniated disc in my tailbone" (02/09/2013)  . DDD (degenerative disc disease)    CERVIAL AND LUMBAR  . Depression   . Edema   . Fatty liver disease, nonalcoholic   . Fibromyalgia    "severe" (02/09/2013)  . Gastroparesis    from DM and chronic narcotic use  . GERD (gastroesophageal reflux disease)   . Human parvovirus infection   . Hyperlipidemia   . Hypertension   . Hypothyroidism   . Interstitial cystitis   . Migraine headache    "weekly; worse lately" (02/09/2013)  . OSA (obstructive sleep apnea)    "have mask;; don't use it; no one came out to check it" (02/09/2013)  . Osteoporosis   . Peripheral neuropathy    "severe" (02/09/2013)  . 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 PONV (postoperative nausea and vomiting)   . Positive PPD   . Shortness of breath    "at anytime; it's gotten worse recently" (02/09/2013)  . Sleep apnea   . Type II diabetes mellitus ([place])   . Vitamin D deficiency   . Vocal cord dysfunction    Past Surgical History:  Past Surgical History:  Procedure Laterality Date  . ABDOMINAL HYSTERECTOMY  1993  . ANTERIOR CERVICAL DECOMP/DISCECTOMY FUSION  2004  . arm surgery     left 2018  . BRONCHOSCOPY  06/28/2019  . BUBBLE STUDY  03/27/2020   Procedure: BUBBLE STUDY;  Surgeon: Josue Hector, MD;  [facility] END[facility]COPY;  Service: Cardiovascular;;  . CARPAL TUNNEL RELEASE Bilateral ?1980's  . CHOLECYSTECTOMY   ?1990  . COLONOSCOPY    . HIP SURGERY Right 2002   "did something to the tibula band" (02/09/2013)  . KNEE ARTHROSCOPY Left 1990's   "fell; clot behind knee cap had to be removed" (02/09/2013)  . LOOP RECORDER INSERTION N/A 03/27/2020   Procedure: LOOP RECORDER INSERTION;  Surgeon: Thompson Grayer, MD;  Location: [facility] CV LAB;  Service: Cardiovascular;  Laterality: N/A;  . NASAL SINUS SURGERY  1986; 1990's   "i've had 3" (02/09/2013)  . RIGHT/LEFT HEART CATH AND CORONARY ANGIOGRAPHY N/A 08/26/2017   Procedure: RIGHT/LEFT HEART CATH AND CORONARY ANGIOGRAPHY;  Surgeon: Belva Crome, MD;  Location: [facility] CV LAB;  Service: Cardiovascular;  Laterality: N/A;  . SHOULDER ARTHROSCOPY W/ ROTATOR CUFF REPAIR Right    "had to go deep in rotator cuff" (02/09/2013)  . TEE WITHOUT CARDIOVERSION N/A 03/27/2020   Procedure: TRANSESOPHAGEAL ECHOCARDIOGRAM (TEE);  Surgeon: Josue Hector, MD;  Location: [facility] ENDOSCOPY;  Service: Cardiovascular;  Laterality: N/A;  . VIDEO BRONCHOSCOPY Bilateral 06/28/2019   Procedure: VIDEO BRONCHOSCOPY WITH FLUORO;  Surgeon: Marshell Garfinkel, MD;  Location: [place] [facility] ENDOSCOPY;  Service: Cardiopulmonary;  Laterality: Bilateral;   HPI:  54 yo female with onset of confusion and lethargy for 3 weeks was admitted to [facility].  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  PMHx:  DM, gastroparesis, asthma, HLD, HTN, fatty liver disease, osteoporosis, CHF, hypothyroidism, depression, GERD   Assessment / Plan / Recommendation Clinical Impression  Pt was seen for bedside swallow evaluation with her mother present towards the end. Pt's mother stated that the pt has had multiple nasal surgeries, but still has post-nasal drip which "falls into her throat" and causes her to cough. Pt reported that she clears her throat during meals but she was unsure as to whether this started since admission. Pt exhibited throat clearing with thin liquids and puree,  indicating possible aspiration. Secondary swallows were noted with puree solids, indicating oral/pharyngeal residue. A modified barium swallow study is recommended to further assess swallow function and it is currently scheduled for today at 1330.  SLP Visit Diagnosis: Dysphagia, unspecified (R13.10)    Aspiration Risk  No limitations    Diet Recommendation     Supervision: Patient able to self feed Postural Changes: Seated upright at 90 degrees    Other  Recommendations Oral Care Recommendations: Oral care BID   Follow up Recommendations Home health SLP      Frequency and Duration min 2x/week  2 weeks       Prognosis Prognosis for Safe Diet Advancement: Good      Swallow Study   General Date of Onset: 03/26/20 HPI: 54 yo female with onset of confusion and lethargy for 3 weeks was admitted to [facility].  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  PMHx:  DM, gastroparesis, asthma, HLD, HTN, fatty liver disease, osteoporosis, CHF, hypothyroidism, depression, GERD Type of Study: Bedside Swallow Evaluation Previous Swallow Assessment: None Diet Prior to this Study: Regular;Thin liquids Temperature Spikes Noted: No Respiratory Status: Room air History of Recent Intubation: No Behavior/Cognition: Alert;Cooperative;Pleasant mood Oral Cavity Assessment: Within Functional Limits Oral Care Completed by SLP: No Oral Cavity - Dentition: Adequate natural dentition Vision: Functional for self-feeding Self-Feeding Abilities: Able to feed self Patient Positioning: Upright in bed Baseline Vocal Quality: Normal Volitional Cough: Strong Volitional Swallow: Able to elicit    Oral/Motor/Sensory Function Overall Oral Motor/Sensory Function: Within functional limits   Ice Chips Ice chips: Within functional limits Presentation: Spoon   Thin Liquid Thin Liquid: Impaired Presentation: Cup;Straw Pharyngeal  Phase Impairments: Throat Clearing -  Delayed;Throat Clearing - Immediate    Nectar Thick Nectar Thick Liquid: Not tested   Honey Thick Honey Thick Liquid: Not tested   Puree Puree: Impaired Presentation: Spoon Pharyngeal Phase Impairments: Throat Clearing - Immediate;Throat Clearing - Delayed   Solid     Solid: Within functional limits Presentation: Self Fed     Karanveer Ramakrishnan I. Hardin Negus, [place], [place] Office number [phone] Pager [phone]  Horton Marshall 03/28/2020,11:44 AM

## 2020-03-28 NOTE — Progress Notes (Signed)
Modified Barium Swallow Progress Note  Patient Details  Name: Cheryl [name] MRN: [id number] Date of Birth: 14-Dec-1965  Today's Date: 03/28/2020  Modified Barium Swallow completed.  Full report located under Chart Review in the Imaging Section.  Brief recommendations include the following:  Clinical Impression  Pt was seen in radiology suite for modified barium swallow study. Trials of puree solids, regular texture solids, a 70m barium tablet, and thin liquids via cup and straw were administered. Pt's oropharyngeal swallow mechanism was within functional limits. Throat clearing was noted during the study; however, no instances of penetration or aspiration were demonstrated. It is recommended that a regular texture diet with thin liquids be continued at this time. Further skilled SLP services are not clinically indicated for swallowing, but SLP will continue to follow pt for cognitive-linguistic treatment.    Swallow Evaluation Recommendations       SLP Diet Recommendations: Regular solids;Thin liquid   Liquid Administration via: Cup;Straw   Medication Administration: Whole meds with liquid   Supervision: Patient able to self feed       Postural Changes: Seated upright at 90 degrees   Oral Care Recommendations: Oral care BID      Ensley Blas I. PHardin Negus MHarkers Island CLake LeelanauOffice number [phone] [phone]  SHorton Marshall9/07/2019,2:09 PM

## 2020-03-28 NOTE — Progress Notes (Signed)
STROKE TEAM PROGRESS NOTE   INTERVAL HISTORY Patient is sitting up comfortably in bed.  She she states her speech is improving but not back to baseline.  She has no new complaints.  Vital signs are stable.  She signed consent and is participating the sleep smart study and tested positive for sleep apnea on the overnight [place] 3 monitor last night and will undergo CPAP tolerability test tonight.   OBJECTIVE Vitals:   03/28/20 0415 03/28/20 0737 03/28/20 0826 03/28/20 1131  BP: (!) 122/50 127/61  110/90  Pulse: 62   77  Resp: 11 12  15   Temp: 97.8 F (36.6 C) 98.3 F (36.8 C)  98.1 F (36.7 C)  TempSrc: Oral Oral  Oral  SpO2: 94% 93% 93% 93%  Weight:      Height:       CBC:  Recent Labs  Lab 03/23/20 1605 03/23/20 1605 03/23/20 1746 03/24/20 0428  WBC 14.2*  --   --  10.1  NEUTROABS 6.9  --   --  4.7  HGB 13.0   < > 13.6 12.0  HCT 39.3   < > 40.0 35.6*  MCV 86.6  --   --  84.2  PLT 372  --   --  255   < > = values in this interval not displayed.   Basic Metabolic Panel:  Recent Labs  Lab 03/25/20 0202 03/26/20 0142  NA 137 137  K 3.6 4.2  CL 104 105  CO2 22 22  GLUCOSE 264* 224*  BUN 12 11  CREATININE 0.71 0.61  CALCIUM 9.5 9.1  MG 1.7  --    Lipid Panel:     Component Value Date/Time   CHOL 217 (H) 03/24/2020 0428   TRIG 136 03/24/2020 0428   HDL 82 03/24/2020 0428   CHOLHDL 2.6 03/24/2020 0428   VLDL 27 03/24/2020 0428   LDLCALC 108 (H) 03/24/2020 0428   HgbA1c:  Lab Results  Component Value Date   HGBA1C 7.6 (H) 03/25/2020   Urine Drug Screen:     Component Value Date/Time   LABOPIA NONE DETECTED 03/24/2020 0346   COCAINSCRNUR NONE DETECTED 03/24/2020 0346   LABBENZ POSITIVE (A) 03/24/2020 0346   AMPHETMU NONE DETECTED 03/24/2020 0346   THCU NONE DETECTED 03/24/2020 0346   LABBARB NONE DETECTED 03/24/2020 0346    Alcohol Level     Component Value Date/Time   ETH <11 02/09/2013 1619    IMAGING past 24h No results found.   PHYSICAL EXAM    Blood pressure 110/90, pulse 77, temperature 98.1 F (36.7 C), temperature source Oral, resp. rate 15, height 5' 2"  (1.575 m), weight 95.2 kg, SpO2 93 %. Pleasant middle-age obese Caucasian lady not in distress Neurological Exam :  awake and alert..   .  Slow nonfluent speech but no aphasia with slight dysarthria. Follows simple verbal commands.  Comprehension appears intact.  Extraocular movements intact.  Pupils equally round and reactive.  Visual fields full to threat in all fields, no facial asymmetry with right lower facial weakness., facial sensation intact, hearing intact to voice, face symmetric, tongue midline, 5 out of 5 strength, tone and normal, bulk is normal, intact to light touch in all 4 extremities, finger-to-nose intact bilaterally no ataxia or dysmetria noted.  NIH stroke scale score 2.  Premorbid modified Rankin score 0  ASSESSMENT/PLAN [name] is a 54 y.o. female with history of DM2, migraines, HTN, HLD, anxiety/depression, GERD, DCHF, hypothyroidism, migraines, nonalcoholic fatty liver disease,  OSA and chronic pain who presents to the hospital with a 3 week history of lethargy and confusion. She did not receive IV t-PA due to late presentation (>4.5 hours from time of onset).  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  MRI head - Acute to subacute infarct involving the left basal ganglia and adjacent white matter. No hemorrhage. Chronic small right thalamic infarct. Minor chronic microvascular ischemic changes.   MRA head - ordered w/o contrast- Allergy to Lohexol (Omnipaque) iodinated contrast (anaphylaxis) -     Carotid Dopplers ; pending  2D echo: ejection fraction 60 to 65%.  No cardiac source embolism.  TEE 03/27/2020 normal  [employer] Virus 2 - negative  LDL  - 108  HgbA1c - 7.6  UDS - benzodiazepine  VTE prophylaxis - Lovenox 40  aspirin 81 mg daily prior to admission per pt not missing any doses, now on aspirin 325 mg daily . Will decrease aspirin dose to 81 and add plavix x 3 weeks then plavix alone    Therapy recommendations: CLR  Disposition:  Pending  Hypertension  Home BP meds: Cozaar ; HCTZ ; Diltiazem ; Bystolic  Current BP meds: Diltiazem  Blood pressure somewhat low at times.  Out of permissive HTN time window per Dr Cheral Marker  Long-term BP goal normotensive  Hyperlipidemia  Home Lipid lowering medication: Lipitor 20 mg daily  LDL 108, goal < 70  Current lipid lowering medication: Lipitor 20 mg daily -> increased to 80 mg daily  Continue statin at discharge  Diabetes  Home diabetic meds: insulin  Current diabetic meds: insulin  HgbA1c 7.6, goal < 7.0 Recent Labs    03/27/20 2129 03/28/20 0605 03/28/20 1108  GLUCAP 288* 211* 315*    Other Stroke Risk Factors  Former cigarette smoker - quit 11 yrs ago  Obesity, Body mass index is 38.39 kg/m., recommend weight loss, diet and exercise as appropriate   Hx stroke/TIA by imaging  Migraines  Obstructive sleep apnea, does not consistently use CPAP at home. Consider [facility] study for pt. Dr. Leonie Man / research staff will followup for enrollment eliqibility      Congestive Heart Failure  Other Active Problems  Code status - Full code  Allergy to Lohexol (Omnipaque) iodinated contrast (anaphylaxis) - would need to discuss contrast with Radiology prior to  CTA   ALT - 55  Will defer to stroke team in the morning if they would like further workup including hypercoag panel.    Hospital day # 4  She presented with a large left basal ganglia infarct likely of cryptogenic etiology.      ANA panel and anticardiolipin antibodies are pending.  Patient is participating in the sleep smart study.  and will undergo CPAP mask tolerability test  tonight she will be given information to review and decide.  Discussed with patient and answered questions.  Greater than 50% time during this 25-minute visit was spent in counseling and coordination of care about her cryptogenic stroke and discussion about sleep apnea and answering questions Antony Contras, MD  To contact Stroke Continuity provider, please refer to [email or web address] After hours, contact General Neurology

## 2020-03-28 NOTE — Progress Notes (Signed)
Physical Therapy Treatment Patient Details Name: Cheryl [name] MRN: [id number] DOB: May 16, 1966 Today's Date: 03/28/2020    History of Present Illness 54 yo female with onset of confusion and lethargy for 3 weeks was admitted to [facility].  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  PMHx:  DM, gastroparesis, asthma, HLD, HTN, fatty liver disease, osteoporosis, CHF, hypothyroidism, depression, GERD    PT Comments    Pt with good progress towards her goals. Pt is limited in safe mobility by decreased cognition of deficits, requiring cuing for not walking too far that she can not return to room due to fatigue. Pt requires cuing for standing rest breaks to recover 3/4 DoE. Pt is min guard for transfers and min guard progressing to min A as she fatigues. D/c plans remain appropriate at this time. PT will continue to follow acutely.   Follow Up Recommendations  CIR     Equipment Recommendations  None recommended by PT    Recommendations for Other Services Rehab consult     Precautions / Restrictions Precautions Precautions: Fall Precaution Comments: hx of incontinence Restrictions Weight Bearing Restrictions: No    Mobility  Bed Mobility               General bed mobility comments: OOB in recliner   Transfers Overall transfer level: Needs assistance Equipment used: Rolling walker (2 wheeled);None Transfers: Sit to/from Stand Sit to Stand: Min guard         General transfer comment: pt able to stand for donning of diaper for incontinence with ambulation   Ambulation/Gait Ambulation/Gait assistance: Min guard;Min assist Gait Distance (Feet): 300 Feet Assistive device: Rolling walker (2 wheeled) Gait Pattern/deviations: Step-through pattern;Decreased stride length;Trunk flexed Gait velocity: decreased Gait velocity interpretation: <1.31 ft/sec, indicative of household ambulator General Gait Details: initiates slow steady  gait, pt reports she is okay to ambulate further, needs cuing to turn around and start back with pt requiring 3x standing rest breaks with last 100 feet and min A for steadying last 15 feet in room     Modified Rankin (Stroke Patients Only) Modified Rankin (Stroke Patients Only) Pre-Morbid Rankin Score: No significant disability Modified Rankin: Moderate disability     Balance Overall balance assessment: Needs assistance Sitting-balance support: Feet supported;No upper extremity supported Sitting balance-Leahy Scale: Good     Standing balance support: Bilateral upper extremity supported;During functional activity Standing balance-Leahy Scale: Fair Standing balance comment: able to static and dynamic stand for donning diaper                            Cognition Arousal/Alertness: Awake/alert Behavior During Therapy: Flat affect;WFL for tasks assessed/performed Overall Cognitive Status: Impaired/Different from baseline Area of Impairment: Attention;Memory;Following commands                   Current Attention Level: Selective Memory: Decreased short-term memory Following Commands: Follows one step commands consistently;Follows one step commands with increased time Safety/Judgement: Decreased awareness of deficits   Problem Solving: Slow processing General Comments: increased time for processing, poor awareness of deficits, coaching needed for appropriate distance of walking       Exercises General Exercises - Upper Extremity Shoulder Flexion: AAROM;Left;10 reps;Other (comment) (to 75* given shoulder pain) Elbow Flexion: AROM;Left;10 reps Elbow Extension: AROM;Left;10 reps Digit Composite Flexion: AROM;Left Composite Extension: AROM;Left Hand Exercises Forearm Supination: AROM;Left;5 reps Forearm Pronation: AROM;Left;5 reps Other Exercises Other Exercises: lap slides, LUE, x10  General Comments General comments (skin integrity, edema, etc.): HR at  rest 76bpm, max noted with ambulation 95bpm      Pertinent Vitals/Pain Pain Assessment: No/denies pain    Home Living                      Prior Function            PT Goals (current goals can now be found in the care plan section) Acute Rehab PT Goals Patient Stated Goal: to get well enough to play with her granddaughter  PT Goal Formulation: With patient Time For Goal Achievement: 04/07/20 Potential to Achieve Goals: Good Progress towards PT goals: Progressing toward goals    Frequency    Min 4X/week      PT Plan Current plan remains appropriate       AM-PAC PT "6 Clicks" Mobility   Outcome Measure  Help needed turning from your back to your side while in a flat bed without using bedrails?: None Help needed moving from lying on your back to sitting on the side of a flat bed without using bedrails?: A Little Help needed moving to and from a bed to a chair (including a wheelchair)?: A Little Help needed standing up from a chair using your arms (e.g., wheelchair or bedside chair)?: None Help needed to walk in hospital room?: A Little Help needed climbing 3-5 steps with a railing? : A Little 6 Click Score: 20    End of Session Equipment Utilized During Treatment: Gait belt Activity Tolerance: Patient tolerated treatment well Patient left: in chair;with call bell/phone within reach Nurse Communication: Mobility status PT Visit Diagnosis: Unsteadiness on feet (R26.81);Muscle weakness (generalized) (M62.81)     Time: 7588-3254 PT Time Calculation (min) (ACUTE ONLY): 25 min  Charges:  $Gait Training: 8-22 mins $Therapeutic Activity: 8-22 mins                     Cheryl Needs B. Cheryl [name] PT, DPT Acute Rehabilitation Services Pager [phone] Office [phone]    [place] 03/28/2020, 5:43 PM

## 2020-03-28 NOTE — Consult Note (Signed)
   [facility] CM Inpatient Consult   03/28/2020  Cheryl [name] [birth year] [id number]  [facility] [ACO] Patient: [employer] Medicare  Patient is currently active with [facility] for chronic disease management services.  Patient has been engaged by a [facility].  Our community based plan of care has focused on disease management and community resource support.  1530: Medical record review reveals patient has been recommended for rehab however family desires to take patient home.  Plan: Will follow patient's progression and  transition of care needs with Inpatient Transition Of Care [TOC] team member.  Will make [facility] following aware of transition of care needs.   Of note, [facility] Care Management services does not replace or interfere with any services that are needed or arranged by inpatient [facility] care management team.  For additional questions or referrals please contact:  Natividad Brood, RN BSN [facility] Liaison  [phone] business mobile phone Toll free office [phone]  Fax number: [phone] [email or web address]

## 2020-03-28 NOTE — TOC Initial Note (Signed)
Transition of Care (TOC) - Initial/Assessment Note  Marvetta Gibbons RN, BSN Transitions of Care Unit 4E- RN Case Manager See Treatment Team for direct phone #    Patient Details  Name: Cheryl [name] MRN: [id number] Date of Birth: [birth year]  Transition of Care [facility]) CM/SW Contact:    Dawayne Patricia, RN Phone Number: 03/28/2020, 3:17 PM  Clinical Narrative:                 Pt admitted from home acute metabolic encephalopathy and CVA, per PT/OT evals recs for CIR, screened by [facility] with CIR on 8/29- however based on MD notes family wanting to take pt home so CIR consult not done.  Call made to daughter [name] to discuss transition plan and needs- confirmed with [facility] family's plan to take pt home and that they do not want INPT rehab or any other Rehab option such as SNF. Per [facility] family will be able to assist 24/7. Pt has RW and cane at home will need 3n1 for home and [place] orders. Will send list for [facility] choice via e-mail as per [facility] request to [email or web address]- TOC will f/u tomorrow regarding choice for [facility] agency.  Pt will need HH orders for discharge.    Expected Discharge Plan: IP Rehab Facility Barriers to Discharge: Continued Medical Work up   Patient Goals and CMS Choice Patient states their goals for this hospitalization and ongoing recovery are:: return home CMS Medicare.gov Compare Post Acute Care list provided to:: Patient Represenative (must comment) Choice offered to / list presented to : Adult Children  Expected Discharge Plan and Services Expected Discharge Plan: [place]   Discharge Planning Services: CM Consult Post Acute Care Choice: Home Health, Durable Medical Equipment Living arrangements for the past 2 months: Single Family Home                 DME Arranged: 3-N-1 DME Agency: [employer]                  Prior Living Arrangements/Services Living arrangements for the past 2 months: Single Family Home Lives with:: Adult  Children Patient language and need for interpreter reviewed:: Yes Do you feel safe going back to the place where you live?: Yes      Need for Family Participation in Patient Care: Yes (Comment) Care giver support system in place?: Yes (comment) Current home services: DME (RW, cane) Criminal Activity/Legal Involvement Pertinent to Current Situation/Hospitalization: No - Comment as needed  Activities of Daily Living      Permission Sought/Granted Permission sought to share information with : Chartered certified accountant granted to share information with : Yes, Verbal Permission Granted     Permission granted to share info w AGENCY: [place] agencies        Emotional Assessment Appearance:: Appears stated age     Orientation: : Oriented to Self Alcohol / Substance Use: Not Applicable Psych Involvement: No (comment)  Admission diagnosis:  DKA (diabetic ketoacidoses) ([place]) [E11.10] Acute ischemic stroke ([place]) [I63.9] Altered mental status, unspecified altered mental status type [R41.82] Diabetic ketoacidosis without coma associated with type 2 diabetes mellitus ([place]) [E11.10] Patient Active Problem List   Diagnosis Date Noted  . Acute metabolic encephalopathy 48/54/6270  . Stroke due to occlusion of left middle cerebral artery ([place]) 03/24/2020  . Mixed diabetic hyperlipidemia associated with type 2 diabetes mellitus ([place]) 03/24/2020  . Belching 10/11/2019  . Abdominal pain, epigastric 10/11/2019  . Bloating 10/11/2019  . Loss of weight 10/11/2019  .  Early satiety 10/11/2019  . Pneumothorax on right 06/28/2019  . ILD (interstitial lung disease) ([place])   . Acute respiratory failure ([place])   . Medication management 06/06/2019  . Lobar pneumonia, unspecified organism ([place]) 05/26/2019  . Fever 05/26/2019  . Dysphagia 04/30/2018  . Hoarseness 04/30/2018  . Chest pain at rest 08/26/2017  . Dyspnea 08/26/2017  . OSA (obstructive sleep apnea) 01/05/2017  . H/O melanoma  excision 02/11/2016  . Breast nodule 02/11/2016  . Nausea without vomiting 12/12/2015  . Leucocytosis 12/12/2015  . Thrush 12/12/2015  . Abdominal pain 12/12/2015  . Thoracic aorta atherosclerosis ([place]) 06/06/2015  . Chest wall pain 12/25/2014  . Snoring 06/29/2014  . Obesity, unspecified 02/04/2014  . DKA, type 2 ([place]) 02/03/2014  . Hypothyroidism 03/02/2013  . IDDM (insulin dependent diabetes mellitus) ([place]) 03/01/2013  . Hypoglycemia 02/09/2013  . Hypokalemia 02/09/2013  . Thrush of mouth and esophagus ([place]) 09/12/2012  . Obesity 05/25/2012  . Cough due to angiotensin-converting enzyme inhibitor 07/12/2011  . DDD (degenerative disc disease)   . Edema   . Human parvovirus infection   . Polyarthropathy associated with another disorder   . Fatty liver disease, nonalcoholic   . Migraine headache   . Positive PPD   . Vitamin D deficiency   . Vocal cord dysfunction   . RHINOSINUSITIS, 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  . Essential hypertension 06/06/2009  . UNSPECIFIED TACHYCARDIA 06/06/2009  . ABNORMAL ELECTROCARDIOGRAM 06/06/2009  . IDDM 04/09/2009  . Asthma not dependent on systemic steroids without complication 98/42/1031  . GERD without esophagitis 04/09/2009  . Chronic fatigue syndrome 04/09/2009   PCP:  Aurea Graff, PA-C (Inactive) Pharmacy:   [facility] [place] [address] [place] [address] Phone: [phone] Fax: [facility] [place], [place] [address] Phone: [phone] Fax: [phone]     Social Determinants of Health (SDOH) Interventions    Readmission Risk Interventions No flowsheet data found.

## 2020-03-29 LAB — CARDIOLIPIN ANTIBODIES, IGG, IGM, IGA
Anticardiolipin IgA: <9 APL U/mL (ref 0–11)
Anticardiolipin IgG: <9 GPL U/mL (ref 0–14)
Anticardiolipin IgM: <9 MPL U/mL (ref 0–12)

## 2020-03-29 LAB — ANA W/REFLEX IF POSITIVE: Anti Nuclear Antibody (ANA): NEGATIVE

## 2020-03-29 LAB — GLUCOSE, CAPILLARY
Glucose-Capillary: 148 mg/dL — ABNORMAL HIGH (ref 70–99)
Glucose-Capillary: 189 mg/dL — ABNORMAL HIGH (ref 70–99)

## 2020-03-29 MED ORDER — ASPIRIN 81 MG PO TBEC: 81.0000 mg | DELAYED_RELEASE_TABLET | Freq: Every day | ORAL | 0 refills | Status: AC

## 2020-03-29 MED ORDER — ATORVASTATIN CALCIUM 80 MG PO TABS: 80.0000 mg | ORAL_TABLET | Freq: Every day | ORAL | 0 refills | Status: DC

## 2020-03-29 MED ORDER — CLOPIDOGREL BISULFATE 75 MG PO TABS: 75.0000 mg | ORAL_TABLET | Freq: Every day | ORAL | 0 refills | Status: DC

## 2020-03-29 NOTE — Discharge Summary (Signed)
Physician Discharge Summary  Cheryl [name] JME:[id number] DOB: 1966-04-30 DOA: 03/23/2020  PCP: Aurea Graff, PA-C (Inactive)  Admit date: 03/23/2020 Discharge date: 03/29/2020  Admitted From: Home Discharge disposition: Home with home health   Code Status: Full Code  Diet Recommendation: Cardiac/diabetic diet  Discharge Diagnosis:   Principal Problem:   Acute metabolic encephalopathy Active Problems:   DKA, type 2 ([place])   Stroke due to occlusion of left middle cerebral artery ([place])   Essential hypertension   Asthma not dependent on systemic steroids without complication   GERD without esophagitis   Hypothyroidism   Mixed diabetic hyperlipidemia associated with type 2 diabetes mellitus ([place])   History of Present Illness / Brief narrative:  Cheryl [name] is a 54 y.o. female with PMH of DM2, HTN, HLD, diastolic CHF, GERD, gastroparesis, asthma, nonalcoholic fatty liver disease, osteoporosis, hypothyroidism. Patient presented to the ED on 03/23/2020 with complaint of progressively worsening lethargy and confusion for 3 weeks. In the ED, patient was noted to be in DKA and was started on insulin infusion per DKA protocol. CT scan of head also showed a left basal ganglia infarct with periventricular white matter ischemic changes concerning for stroke. MRI confirmed acute/subacute stroke. Patient was admitted to hospitalist service for further evaluation management.  Neurology consultation was obtained.  Subjective:  Seen and examined this morning.  Pleasant middle-aged Caucasian female.  Sitting up in bed.  Not in distress.  Has mild deficits from stroke. Oriented x3.  Feels comfortable going home today.  Hospital Course:  Acute metabolic encephalopathy -Likely secondary to DKA and a stroke. -UDS positive for benzo -Sedatives including Neurontin, hydroxyzine on hold -Mental status improved now.  Acute CVA  -CT headrevealed low density is seen involving the left  basal ganglia and periventricular white matter most consistent with acute infarction. -MRI brain revealed acute to subacute infarct involving the left basal ganglia and adjacent white matter -Neurology recommended appreciated. -PT recommended CIR.  Patient and family chose to go home with home health and PT. -Echocardiogram EF 60-65%, no intracardiac source of embolism -Patient was on aspirin 81 mg daily prior to admission per neurology, patient is currently on aspirin 81 mg daily and Plavix 75 mg daily.  After 3 weeks, patient will take Plavix alone. -s/pTEE and loop recorder 8/31  DKA -Patient was in DKA on admission.  Managed with insulin infusion, IV fluids per DKA protocol.  -Subsequently transitioned to subcutaneous insulin.   Diabetes mellitus type 2 -A1c 7.6 on 8/29. -Home meds include 100 units daily of Humulin R U500, Victoza 1.2 mg daily.  -Managed in the hospital with long-acting insulin and premeal insulin.  -Since patient's A1c was 7.6 in target 7-8, I would resume her home regimen at discharge.  Obstructive sleep apnea -Patient enrolled in sleep smart study in the hospital.  She was diagnosed with sleep apnea per study protocol. She will be discharged with mask and supplies  Essential hypertension -Continue Cardizem 229 mg daily, Bystolic 10 mg daily, losartan 25 mg daily. -HCTZ on hold.  Hypothyroidism -Continue Synthroid  Hyperlipidemia -Continue Lipitor  GERD -Continue PPI  Anxiety/depression -Continue psych meds including Prozac, as needed Valium  Wound care: Incision (Closed) Chest Left (Active)  No Date First Assessed or Time First Assessed found.   Location: Chest  Location Orientation: Left    Assessments 03/28/2020  7:31 PM  Site / Wound Assessment Dressing in place / Unable to assess  Margins Attached edges (approximated)  Closure Approximated;Adhesive strips  Drainage Amount None     No Linked orders to display    Discharge Exam:    Vitals:   03/28/20 1944 03/28/20 2313 03/29/20 0425 03/29/20 0833  BP: (!) 119/59 (!) 137/57 132/63 128/68  Pulse: 64 (!) 57 68 76  Resp: 10 15 13 12   Temp: 98.3 F (36.8 C) 98.6 F (37 C) 98.8 F (37.1 C) 98.3 F (36.8 C)  TempSrc: Oral Oral Oral Oral  SpO2: 98% 97% 99% 96%  Weight:      Height:        Body mass index is 38.39 kg/m.  General exam: Appears calm and comfortable.  Not in physical distress Skin: No rashes, lesions or ulcers. HEENT: Atraumatic, normocephalic, supple neck, no obvious bleeding Lungs: Clear to auscultation bilaterally CVS: Regular rate and rhythm, no murmur GI/Abd soft, nontender, nondistended, bowel present CNS: Alert, awake, oriented x3, mild dysarthria present Psychiatry: Mood appropriate Extremities: No pedal edema, no calf tenderness  Follow ups:   Discharge Instructions    Diet - low sodium heart healthy   Complete by: As directed    Diet Carb Modified   Complete by: As directed    Increase activity slowly   Complete by: As directed    Leave dressing on - Keep it clean, dry, and intact until clinic visit   Complete by: As directed       Follow-up Information    [facility].   Specialty: Cardiology Why: 04/03/2020 @ 4;30PM, wound check visit Contact information: [address] [place] Follow up.   Contact information: [address]     [place]  [address] [phone]       Aurea Graff, PA-C Follow up.   Specialty: Physician Assistant Contact information: [address] [place] [id number] [phone]               Recommendations for Outpatient Follow-Up:   1. Follow-up with PCP as an outpatient 2. Follow-up with neurology as an outpatient  Discharge Instructions:  Follow with Primary MD Aurea Graff, PA-C (Inactive) in 7 days   Get  CBC/BMP checked in next visit within 1 week by PCP or SNF MD ( we routinely change or add medications that can affect your baseline labs and fluid status, therefore we recommend that you get the mentioned basic workup next visit with your PCP, your PCP may decide not to get them or add new tests based on their clinical decision)  On your next visit with your PCP, please Get Medicines reviewed and adjusted.  Please request your PCP  to go over all Hospital Tests and Procedure/Radiological results at the follow up, please get all Hospital records sent to your Prim MD by signing hospital release before you go home.  Activity: As tolerated with Full fall precautions use walker/cane & assistance as needed  For Heart failure patients - Check your Weight same time everyday, if you gain over 2 pounds, or you develop in leg swelling, experience more shortness of breath or chest pain, call your Primary MD immediately. Follow Cardiac Low Salt Diet and 1.5 lit/day fluid restriction.  If you have smoked or chewed Tobacco in the last 2 yrs please stop smoking, stop any regular Alcohol  and or any Recreational drug use.  If you experience worsening of your admission symptoms, develop shortness of breath,  life threatening emergency, suicidal or homicidal thoughts you must seek medical attention immediately by calling 911 or calling your MD immediately  if symptoms less severe.  You Must read complete instructions/literature along with all the possible adverse reactions/side effects for all the Medicines you take and that have been prescribed to you. Take any new Medicines after you have completely understood and accpet all the possible adverse reactions/side effects.   Do not drive, operate heavy machinery, perform activities at heights, swimming or participation in water activities or provide baby sitting services if your were admitted for syncope or siezures until you have seen by Primary MD or a Neurologist and  advised to do so again.  Do not drive when taking Pain medications.  Do not take more than prescribed Pain, Sleep and Anxiety Medications  Wear Seat belts while driving.   Please note You were cared for by a hospitalist during your hospital stay. If you have any questions about your discharge medications or the care you received while you were in the hospital after you are discharged, you can call the unit and asked to speak with the hospitalist on call if the hospitalist that took care of you is not available. Once you are discharged, your primary care physician will handle any further medical issues. Please note that NO REFILLS for any discharge medications will be authorized once you are discharged, as it is imperative that you return to your primary care physician (or establish a relationship with a primary care physician if you do not have one) for your aftercare needs so that they can reassess your need for medications and monitor your lab values.    Allergies as of 03/29/2020      Reactions   Influenza Vaccines Anaphylaxis, Swelling   Throat swelling, "flu" symptoms   Iodinated Diagnostic Agents Shortness Of Breath, Nausea And Vomiting, Nausea Only, Rash   Iodine-131 Shortness Of Breath, Nausea And Vomiting, Rash   Iohexol Anaphylaxis, Hives, Swelling    Desc: hives,dyspnea; throat swelling; ok w/ premeds and omnipaque   Levofloxacin Anaphylaxis, Hives   Nucynta [tapentadol Hydrochloride] Other (See Comments)   Hallucinations and  insomnia x3 days   Pregabalin Other (See Comments)   (lyrica)Reaction-Syncope & unable to speak   Sulfonamide Derivatives Anaphylaxis   Vantin [cefpodoxime] Anaphylaxis, Hives, Rash, Cough   Vilazodone Hcl Other (See Comments)   (Viibryd) Hallucinations   Amoxicillin Hives, Itching   Did it involve swelling of the face/tongue/throat, SOB, or low BP? No Did it involve sudden or severe rash/hives, skin peeling, or any reaction on the inside of your mouth  or nose? Yes Did you need to seek medical attention at a hospital or doctor's office? No When did it last happen?3-4 years ago If all above answers are "NO", may proceed with cephalosporin use. .   Biaxin [clarithromycin] Hives   Carbamazepine Itching   (Tegretol)   Ciprofloxacin Hives   Dilaudid [hydromorphone Hcl] Itching, Rash   redness   Iodides Nausea Only   Percocet [oxycodone-acetaminophen] Rash      Medication List    STOP taking these medications   azithromycin 250 MG tablet Commonly known as: ZITHROMAX   gabapentin 300 MG capsule Commonly known as: NEURONTIN   hydrochlorothiazide 25 MG tablet Commonly known as: HYDRODIURIL   hydrOXYzine 25 MG tablet Commonly known as: ATARAX/VISTARIL   methylPREDNISolone 4 MG Tbpk tablet Commonly known as: MEDROL DOSEPAK   omeprazole 40 MG capsule Commonly known as: PRILOSEC   ondansetron 4 MG disintegrating  tablet Commonly known as: Zofran ODT     TAKE these medications   albuterol 108 (90 Base) MCG/ACT inhaler Commonly known as: VENTOLIN HFA INHALE 2 PUFFS EVERY 6 HOURS AS NEEDED FOR WHEEZING OR SHORTNESS OF BREATH.   aspirin 81 MG EC tablet Take 1 tablet (81 mg total) by mouth daily for 21 days. Swallow whole. Start taking on: March 30, 2020   atorvastatin 80 MG tablet Commonly known as: LIPITOR Take 1 tablet (80 mg total) by mouth daily. Start taking on: March 30, 2020 What changed:   medication strength  how much to take   clopidogrel 75 MG tablet Commonly known as: PLAVIX Take 1 tablet (75 mg total) by mouth daily. Start taking on: March 30, 2020   diazepam 10 MG tablet Commonly known as: VALIUM Take 10 mg by mouth 2 (two) times daily as needed (panic attacks.).   diltiazem 240 MG 24 hr capsule Commonly known as: CARDIZEM CD TAKE 1 CAPSULE BY MOUTH EVERY MORNING ON AN EMPTY STOMACH - [name] DENIED, FAXED DR Johnsie Cancel 10/22/15 SS What changed: See the new instructions.   FLUoxetine 40 MG  capsule Commonly known as: PROZAC Take 80 mg by mouth at bedtime.   HumuLIN R U-500 KwikPen 500 UNIT/ML kwikpen Generic drug: insulin regular human CONCENTRATED Inject 100 Units into the skin daily.   ibuprofen 800 MG tablet Commonly known as: ADVIL Take 800 mg by mouth every 8 (eight) hours as needed (pain).   ipratropium-albuterol 0.5-2.5 (3) MG/3ML Soln Commonly known as: DUONEB Take 25m by nebulization every 4-6 hours if needed What changed:   how much to take  how to take this  when to take this  reasons to take this  additional instructions   levothyroxine 75 MCG tablet Commonly known as: SYNTHROID Take 75 mcg by mouth daily before breakfast.   losartan 25 MG tablet Commonly known as: COZAAR Take 25 mg by mouth daily.   metoCLOPramide 10 MG tablet Commonly known as: REGLAN Take 10 mg by mouth 3 (three) times daily. What changed: Another medication with the same name was removed. Continue taking this medication, and follow the directions you see here.   nebivolol 10 MG tablet Commonly known as: Bystolic Take 1 tablet (10 mg total) by mouth daily.   nystatin-triamcinolone cream Commonly known as: MYCOLOG II Apply 1 application topically 2 (two) times daily as needed (yeast in skin folds).   pantoprazole 40 MG tablet Commonly known as: PROTONIX Take 1 tablet (40 mg total) by mouth 2 (two) times daily.   pentosan polysulfate 100 MG capsule Commonly known as: ELMIRON Take 100 mg by mouth 3 (three) times daily.   promethazine 25 MG tablet Commonly known as: PHENERGAN Take 25 mg by mouth 2 (two) times daily as needed for nausea.   SUMAtriptan 20 MG/ACT nasal spray Commonly known as: IMITREX Place 20 mg into the nose every 2 (two) hours as needed for migraine or headache. May repeat in 2 hours if headache persists or recurs.   tiZANidine 4 MG tablet Commonly known as: ZANAFLEX Take 4 mg by mouth 3 (three) times daily. May hold one dose if needed to  operate motor vehicle.   Trelegy Ellipta 100-62.5-25 MCG/INH Aepb Generic drug: Fluticasone-Umeclidin-Vilant Inhale 1 puff into the lungs daily.   Victoza 18 MG/3ML Sopn Generic drug: liraglutide Inject 1.2 mg into the skin every evening.   Vitamin D3 25 MCG (1000 UT) Caps Take 1,000 Units by mouth daily.  Durable Medical Equipment  (From admission, onward)         Start     Ordered   03/29/20 1107  DME 3-in-1  Once        03/29/20 1107           Discharge Care Instructions  (From admission, onward)         Start     Ordered   03/29/20 0000  Leave dressing on - Keep it clean, dry, and intact until clinic visit        03/29/20 1107          Time coordinating discharge: 35 minutes  The results of significant diagnostics from this hospitalization (including imaging, microbiology, ancillary and laboratory) are listed below for reference.    Procedures and Diagnostic Studies:   CT Head Wo Contrast  Result Date: 03/23/2020 CLINICAL DATA:  Delirium. EXAM: CT HEAD WITHOUT CONTRAST TECHNIQUE: Contiguous axial images were obtained from the base of the skull through the vertex without intravenous contrast. COMPARISON:  June 17, 2018. FINDINGS: Brain: Low density is seen involving the left basal ganglia and periventricular white matter most consistent with acute infarction. Ventricular size is within normal limits. No midline shift is noted. No definite hemorrhage or mass lesion is noted. Vascular: No hyperdense vessel or unexpected calcification. Skull: Normal. Negative for fracture or focal lesion. Sinuses/Orbits: No acute finding. Other: None. IMPRESSION: Low density is seen involving the left basal ganglia and periventricular white matter most consistent with acute infarction. MRI is recommended for further evaluation. Electronically Signed   By: Marijo Conception M.D.   On: 03/23/2020 16:50   MR BRAIN WO CONTRAST  Result Date: 03/24/2020 CLINICAL DATA:   Stroke on CT EXAM: MRI HEAD WITHOUT CONTRAST TECHNIQUE: Multiplanar, multiecho pulse sequences of the brain and surrounding structures were obtained without intravenous contrast. COMPARISON:  2019, correlation made with CT from 03/23/2020 FINDINGS: Brain: There is mildly reduced diffusion involving the left caudate, lentiform nucleus, and adjacent internal capsule. No evidence of intracranial hemorrhage. There is no intracranial mass, mass effect, or edema. There is no hydrocephalus or extra-axial fluid collection. Chronic small vessel infarct of the posterior right thalamus. Ventricles and sulci are within normal limits. A few small foci of T2 hyperintensity in the supratentorial and pontine white matter are nonspecific but may reflect minor chronic microvascular ischemic changes. These findings have progressed since 2019. Vascular: Major vessel flow voids at the skull base are preserved. Skull and upper cervical spine: Normal marrow signal is preserved. Sinuses/Orbits: Minor mucosal thickening.  Orbits are unremarkable. Other: Sella is unremarkable.  Mastoid air cells are clear. IMPRESSION: Acute to subacute infarct involving the left basal ganglia and adjacent white matter. No hemorrhage. Chronic small right thalamic infarct. Minor chronic microvascular ischemic changes. Electronically Signed   By: Macy Mis M.D.   On: 03/24/2020 13:20   DG Chest Port 1 View  Result Date: 03/23/2020 CLINICAL DATA:  Altered mental status for several weeks, possible DKA EXAM: PORTABLE CHEST 1 VIEW COMPARISON:  10/24/2018 FINDINGS: The heart size and mediastinal contours are within normal limits. Both lungs are clear. The visualized skeletal structures are unremarkable. IMPRESSION: No active disease. Electronically Signed   By: Inez Catalina M.D.   On: 03/23/2020 17:43     Labs:   Basic Metabolic Panel: Recent Labs  Lab 03/23/20 1605 03/23/20 1605 03/23/20 1746 03/23/20 1746 03/23/20 2033 03/23/20 2033  03/24/20 0428 03/24/20 0428 03/25/20 0202 03/26/20 0142  NA 131*   < >  133*  --  133*  --  140  --  137 137  K 3.7   < > 3.8   < > 3.2*   < > 3.0*   < > 3.6 4.2  CL 97*  --   --   --  101  --  105  --  104 105  CO2 18*  --   --   --  22  --  24  --  22 22  GLUCOSE 484*  --   --   --  191*  --  164*  --  264* 224*  BUN 36*  --   --   --  28*  --  16  --  12 11  CREATININE 1.44*  --   --   --  0.86  --  0.73  --  0.71 0.61  CALCIUM 9.1  --   --   --  9.0  --  9.4  --  9.5 9.1  MG  --   --   --   --   --   --   --   --  1.7  --    < > = values in this interval not displayed.   GFR Estimated Creatinine Clearance: 87.4 mL/min (by C-G formula based on SCr of 0.61 mg/dL). Liver Function Tests: Recent Labs  Lab 03/23/20 1605 03/23/20 2033 03/24/20 0428  AST 50* 37 33  ALT 62* 55* 52*  ALKPHOS 71 65 64  BILITOT 0.6 0.5 0.5  PROT 7.4 7.1 6.3*  ALBUMIN 4.2 3.9 3.6   No results for input(s): LIPASE, AMYLASE in the last 168 hours. Recent Labs  Lab 03/24/20 0428  AMMONIA 38*   Coagulation profile No results for input(s): INR, PROTIME in the last 168 hours.  CBC: Recent Labs  Lab 03/23/20 1605 03/23/20 1746 03/24/20 0428  WBC 14.2*  --  10.1  NEUTROABS 6.9  --  4.7  HGB 13.0 13.6 12.0  HCT 39.3 40.0 35.6*  MCV 86.6  --  84.2  PLT 372  --  255   Cardiac Enzymes: No results for input(s): CKTOTAL, CKMB, CKMBINDEX, TROPONINI in the last 168 hours. BNP: Invalid input(s): POCBNP CBG: Recent Labs  Lab 03/28/20 0605 03/28/20 1108 03/28/20 1601 03/28/20 2115 03/29/20 0609  GLUCAP 211* 315* 274* 190* 148*   D-Dimer No results for input(s): DDIMER in the last 72 hours. Hgb A1c No results for input(s): HGBA1C in the last 72 hours. Lipid Profile No results for input(s): CHOL, HDL, LDLCALC, TRIG, CHOLHDL, LDLDIRECT in the last 72 hours. Thyroid function studies No results for input(s): TSH, T4TOTAL, T3FREE, THYROIDAB in the last 72 hours.  Invalid input(s):  FREET3 Anemia work up No results for input(s): VITAMINB12, FOLATE, FERRITIN, TIBC, IRON, RETICCTPCT in the last 72 hours. Microbiology Recent Results (from the past 240 hour(s))  SARS Coronavirus 2 by RT PCR (hospital order, performed in [facility] hospital lab) Nasopharyngeal Nasopharyngeal Swab     Status: None   Collection Time: 03/23/20  6:00 PM   Specimen: Nasopharyngeal Swab  Result Value Ref Range Status   SARS Coronavirus 2 NEGATIVE NEGATIVE Final    Comment: (NOTE) SARS-CoV-2 target nucleic acids are NOT DETECTED.  The SARS-CoV-2 RNA is generally detectable in upper and lower respiratory specimens during the acute phase of infection. The lowest concentration of SARS-CoV-2 viral copies this assay can detect is 250 copies / mL. A negative result does not preclude SARS-CoV-2 infection and should not be  used as the sole basis for treatment or other patient management decisions.  A negative result may occur with improper specimen collection / handling, submission of specimen other than nasopharyngeal swab, presence of viral mutation(s) within the areas targeted by this assay, and inadequate number of viral copies (<250 copies / mL). A negative result must be combined with clinical observations, patient history, and epidemiological information.  Fact Sheet for Patients:   StrictlyIdeas.no  Fact Sheet for Healthcare Providers: BankingDealers.co.za  This test is not yet approved or  cleared by the [place] FDA and has been authorized for detection and/or diagnosis of SARS-CoV-2 by FDA under an Emergency Use Authorization (EUA).  This EUA will remain in effect (meaning this test can be used) for the duration of the COVID-19 declaration under Section 564(b)(1) of the Act, 21 U.S.C. section 360bbb-3(b)(1), unless the authorization is terminated or revoked sooner.  Performed at [facility], [address]  [place], [place] [address]      Signed: Terrilee Croak  [facility] 03/29/2020, 11:10 AM

## 2020-03-29 NOTE — Plan of Care (Signed)
  Problem: Education: Goal: Ability to describe self-care measures that may prevent or decrease complications (Diabetes Survival Skills Education) will improve Outcome: Progressing Goal: Individualized Educational Video(s) Outcome: Progressing   Problem: Cardiac: Goal: Ability to maintain an adequate cardiac output will improve Outcome: Progressing   Problem: Health Behavior/Discharge Planning: Goal: Ability to identify and utilize available resources and services will improve Outcome: Progressing Goal: Ability to manage health-related needs will improve Outcome: Progressing   Problem: Fluid Volume: Goal: Ability to achieve a balanced intake and output will improve Outcome: Progressing   Problem: Nutritional: Goal: Maintenance of adequate nutrition will improve Outcome: Progressing Goal: Maintenance of adequate weight for body size and type will improve Outcome: Progressing   Problem: Respiratory: Goal: Will regain and/or maintain adequate ventilation Outcome: Progressing   Problem: Education: Goal: Knowledge of disease or condition will improve Outcome: Progressing Goal: Knowledge of secondary prevention will improve Outcome: Progressing Goal: Knowledge of patient specific risk factors addressed and post discharge goals established will improve Outcome: Progressing   Problem: Self-Care: Goal: Verbalization of feelings and concerns over difficulty with self-care will improve Outcome: Progressing Goal: Ability to communicate needs accurately will improve Outcome: Progressing   Problem: Nutrition: Goal: Dietary intake will improve Outcome: Progressing   Problem: Elimination: Goal: Will not experience complications related to bowel motility Outcome: Progressing Goal: Will not experience complications related to urinary retention Outcome: Progressing   Problem: Pain Managment: Goal: General experience of comfort will improve Outcome: Progressing   Problem:  Safety: Goal: Ability to remain free from injury will improve Outcome: Progressing

## 2020-03-29 NOTE — Anesthesia Postprocedure Evaluation (Signed)
Anesthesia Post Note  Patient: Cheryl [name]  Procedure(s) Performed: TRANSESOPHAGEAL ECHOCARDIOGRAM (TEE) (N/A ) BUBBLE STUDY     Patient location during evaluation: Endoscopy Anesthesia Type: MAC Level of consciousness: awake and alert Pain management: pain level controlled Vital Signs Assessment: post-procedure vital signs reviewed and stable Respiratory status: spontaneous breathing, nonlabored ventilation, respiratory function stable and patient connected to nasal cannula oxygen Cardiovascular status: stable and blood pressure returned to baseline Postop Assessment: no apparent nausea or vomiting Anesthetic complications: no   No complications documented.  Last Vitals:  Vitals:   03/29/20 0425 03/29/20 0833  BP: 132/63 128/68  Pulse: 68 76  Resp: 13 12  Temp: 37.1 C 36.8 C  SpO2: 99% 96%    Last Pain:  Vitals:   03/29/20 0833  TempSrc: Oral  PainSc:                  Samanvi Cuccia

## 2020-03-29 NOTE — Progress Notes (Signed)
Reviewed discharge summary with patient including medication changes.  Medication / follow-up appointment teach back completed.  IV access discontinued.  CCMD notified.   Assisted to private vehicle via staff w/o difficulty

## 2020-03-29 NOTE — TOC Transition Note (Signed)
Transition of Care (TOC) - CM/SW Discharge Note Marvetta Gibbons RN, BSN Transitions of Care Unit 4E- RN Case Manager See Treatment Team for direct phone #    Patient Details  Name: Cheryl [name] MRN: [id number] Date of Birth: February 08, 1966  Transition of Care [facility]) CM/SW Contact:  Dawayne Patricia, RN Phone Number: 03/29/2020, 12:23 PM   Clinical Narrative:    Pt stable for transition home today, orders have been placed for [facility] and DME needs. Follow up call made to daughter Cheryl [name] for [facility] choice. She had received email with Medicare choice list- as per her choice- first is [facility] [place] then [facility]. No preference for DME agency and is fine with using in house provider [facility] to deliver to the room.  Daughter to come provide transport later this afternoon.   Call made to [facility] line- for 3n1 need- DME to be delivered to room once processed.   Call made to Butch Penny for [facility] referral- however they are unable to accept due to staffing, call then made to [facility] with Alvis Lemmings as per daughter Choice- Alvis Lemmings is able to accept referral for HHRN/PT needs.    Final next level of care: [place] Barriers to Discharge: Barriers Resolved   Patient Goals and CMS Choice Patient states their goals for this hospitalization and ongoing recovery are:: return home CMS Medicare.gov Compare Post Acute Care list provided to:: Patient Represenative (must comment) Choice offered to / list presented to : Adult Children  Discharge Placement               Home with [facility]        Discharge Plan and Services   Discharge Planning Services: CM Consult Post Acute Care Choice: Home Health, Durable Medical Equipment          DME Arranged: 3-N-1 DME Agency: [facility] Date DME Agency Contacted: 03/29/20 Time DME Agency Contacted: 98 Representative spoke with at DME Agency: [place]: RN, PT [place]: [place] Date [place]: 03/29/20 Time [place]: 1205 Representative spoke with at [place]: [place] ([place]) Interventions     Readmission Risk Interventions Readmission Risk Prevention Plan 03/29/2020  Post Dischage Appt Complete  Medication Screening Complete  Transportation Screening Complete  Some recent data might be hidden

## 2020-03-29 NOTE — Progress Notes (Signed)
STROKE TEAM PROGRESS NOTE   INTERVAL HISTORY Patient is sitting up comfortably in bed.  She she states her speech is improving but not back to baseline.  She has no new complaints.  Vital signs are stable.  She underwent CPAP tolerability test last night and tolerated well.  She has been randomized to CPAP treatment in the sleep smart study and seems very happy.ANA panel is negative and anticardiolipin antibodies are pending  OBJECTIVE Vitals:   03/28/20 2313 03/29/20 0425 03/29/20 0833 03/29/20 1153  BP: (!) 137/57 132/63 128/68 138/64  Pulse: (!) 57 68 76 74  Resp: 15 13 12 17   Temp: 98.6 F (37 C) 98.8 F (37.1 C) 98.3 F (36.8 C) 98.2 F (36.8 C)  TempSrc: Oral Oral Oral Oral  SpO2: 97% 99% 96% 95%  Weight:      Height:       CBC:  Recent Labs  Lab 03/23/20 1605 03/23/20 1605 03/23/20 1746 03/24/20 0428  WBC 14.2*  --   --  10.1  NEUTROABS 6.9  --   --  4.7  HGB 13.0   < > 13.6 12.0  HCT 39.3   < > 40.0 35.6*  MCV 86.6  --   --  84.2  PLT 372  --   --  255   < > = values in this interval not displayed.   Basic Metabolic Panel:  Recent Labs  Lab 03/25/20 0202 03/26/20 0142  NA 137 137  K 3.6 4.2  CL 104 105  CO2 22 22  GLUCOSE 264* 224*  BUN 12 11  CREATININE 0.71 0.61  CALCIUM 9.5 9.1  MG 1.7  --    Lipid Panel:     Component Value Date/Time   CHOL 217 (H) 03/24/2020 0428   TRIG 136 03/24/2020 0428   HDL 82 03/24/2020 0428   CHOLHDL 2.6 03/24/2020 0428   VLDL 27 03/24/2020 0428   LDLCALC 108 (H) 03/24/2020 0428   HgbA1c:  Lab Results  Component Value Date   HGBA1C 7.6 (H) 03/25/2020   Urine Drug Screen:     Component Value Date/Time   LABOPIA NONE DETECTED 03/24/2020 0346   COCAINSCRNUR NONE DETECTED 03/24/2020 0346   LABBENZ POSITIVE (A) 03/24/2020 0346   AMPHETMU NONE DETECTED 03/24/2020 0346   THCU NONE DETECTED 03/24/2020 0346   LABBARB NONE DETECTED 03/24/2020 0346    Alcohol Level     Component Value Date/Time   ETH <11  02/09/2013 1619    IMAGING past 24h No results found.   PHYSICAL EXAM   Blood pressure 138/64, pulse 74, temperature 98.2 F (36.8 C), temperature source Oral, resp. rate 17, height 5' 2"  (1.575 m), weight 95.2 kg, SpO2 95 %. Pleasant middle-age obese Caucasian lady not in distress Neurological Exam :  awake and alert.. Oriented to time place and person.  Slow nonfluent speech but no aphasia with slight dysarthria. Follows simple verbal commands.  Comprehension appears intact.  Extraocular movements intact.  Pupils equally round and reactive.  Visual fields full to threat in all fields, no facial asymmetry with right lower facial weakness., facial sensation intact, hearing intact to voice, face symmetric, tongue midline, 5 out of 5 strength, tone and normal, bulk is normal, intact to light touch in all 4 extremities, finger-to-nose intact bilaterally no ataxia or dysmetria noted.  NIH stroke scale score 2.  Premorbid modified Rankin score 0  ASSESSMENT/PLAN Cheryl [name] is a 54 y.o. female with history of DM2, migraines, HTN,  HLD, anxiety/depression, GERD, DCHF, hypothyroidism, migraines, nonalcoholic fatty liver disease, OSA and chronic pain who presents to the hospital with a 3 week history of lethargy and confusion. She did not receive IV t-PA due to late presentation (>4.5 hours from time of onset).  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  MRI head - Acute to subacute infarct involving the left basal ganglia and adjacent white matter. No hemorrhage. Chronic small right thalamic infarct. Minor chronic microvascular ischemic changes.   MRA head - ordered w/o contrast- Allergy to Lohexol (Omnipaque) iodinated contrast (anaphylaxis) -     Carotid Dopplers ; pending  2D echo: ejection  fraction 60 to 65%.  No cardiac source embolism.  TEE 03/27/2020 normal  [employer] Virus 2 - negative  LDL - 108  HgbA1c - 7.6  UDS - benzodiazepine  VTE prophylaxis - Lovenox 40  aspirin 81 mg daily prior to admission per pt not missing any doses, now on aspirin 325 mg daily . Will decrease aspirin dose to 81 and add plavix x 3 weeks then plavix alone    Therapy recommendations: CLR  Disposition:  Pending  Hypertension  Home BP meds: Cozaar ; HCTZ ; Diltiazem ; Bystolic  Current BP meds: Diltiazem  Blood pressure somewhat low at times.  Out of permissive HTN time window per Dr Cheral Marker . Long-term BP goal normotensive  Hyperlipidemia  Home Lipid lowering medication: Lipitor 20 mg daily  LDL 108, goal < 70  Current lipid lowering medication: Lipitor 20 mg daily -> increased to 80 mg daily  Continue statin at discharge  Diabetes  Home diabetic meds: insulin  Current diabetic meds: insulin  HgbA1c 7.6, goal < 7.0 Recent Labs    03/28/20 2115 03/29/20 0609 03/29/20 1151  GLUCAP 190* 148* 189*    Other Stroke Risk Factors  Former cigarette smoker - quit 11 yrs ago  Obesity, Body mass index is 38.39 kg/m., recommend weight loss, diet and exercise as appropriate   Hx stroke/TIA by imaging  Migraines  Obstructive sleep apnea, does not consistently use CPAP at home. Consider [place] study for pt. Dr. Leonie Man / research staff will followup for enrollment eliqibility      Congestive Heart Failure  Other Active Problems  Code status - Full code  Allergy to Lohexol (Omnipaque) iodinated contrast (anaphylaxis) - would need to discuss contrast with Radiology prior to  CTA   ALT - 55  Will defer to stroke team in the morning if they would like further workup including hypercoag panel.    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   .  Patient is participating in the sleep smart  study.  Was randomized to the CPAP treatment arm.  Follow-up as an outpatient in the stroke clinic as per study protocol.  Stroke team will sign off.  Discussed with Dr. Vivia Ewing.  Discussed with patient and answered questions.  Greater than 50% time during this 25-minute visit was spent in counseling and coordination of care about her cryptogenic stroke and discussion about sleep apnea and answering questions Antony Contras, MD  To contact Stroke Continuity provider, please refer to [email or web address] After hours, contact General Neurology

## 2020-03-30 ENCOUNTER — Other Ambulatory Visit: Payer: Self-pay

## 2020-03-30 NOTE — Patient Outreach (Signed)
[facility]) Care Management  03/30/2020  Cheryl [name] May 17, 1966 [id number]   Telephone call to patient's daughter [name], who is primary contact since stroke for hospital follow up.  No answer. HIPAA compliant voice message left.   Plan: RN CM will send letter and attempt again within 4 business days.   [name], RN, MSN [place] Management Care Management Coordinator Direct Line [phone] Cell [phone] Toll Free: [phone]  Fax: [phone]

## 2020-03-30 NOTE — Patient Outreach (Signed)
[facility]) Care Management  [place]  03/30/2020   Cheryl [name] 03/08/1966 [id number]  Subjective: Return call to daughter [name].  Patient discharged 03/29/20 from the hospital after stroke.  Daughter states that patient had fallen a couple of times and nobody knew it until patient told her and after that she was sent to the hospital.  She reports that patient is ok but has some periods of confusion, which she expects. She states patient was able to bathe and dress self today independently,  while she stood by for assistance.  Patient using walker for ambulation. She has all patient medications and medications reviewed. Home health has been ordered for patient but they have not made contact today yet. Provided Chelsea with contact information if they do not call.    Discussed follow up appointments per discharge summary and advised Chelsea to make follow up appointments.  She will be taking patient to follow ups.    Reviewed signs of stroke and that medical assistance is an emergency. She verbalized understanding and voices no concerns.   Objective:   Encounter Medications:  Outpatient Encounter Medications as of 03/30/2020  Medication Sig Note  . albuterol (VENTOLIN HFA) 108 (90 Base) MCG/ACT inhaler INHALE 2 PUFFS EVERY 6 HOURS AS NEEDED FOR WHEEZING OR SHORTNESS OF BREATH.   [name] aspirin EC 81 MG EC tablet Take 1 tablet (81 mg total) by mouth daily for 21 days. Swallow whole.   [name] atorvastatin (LIPITOR) 80 MG tablet Take 1 tablet (80 mg total) by mouth daily.   . clopidogrel (PLAVIX) 75 MG tablet Take 1 tablet (75 mg total) by mouth daily.   . diazepam (VALIUM) 10 MG tablet Take 10 mg by mouth 2 (two) times daily as needed (panic attacks.).    [name] diltiazem (CARDIZEM CD) 240 MG 24 hr capsule TAKE 1 CAPSULE BY MOUTH EVERY MORNING ON AN EMPTY STOMACH - [name] DENIED, FAXED DR Johnsie Cancel 10/22/15 SS (Patient taking differently: Take 240 mg by mouth daily. )   .  FLUoxetine (PROZAC) 40 MG capsule Take 80 mg by mouth at bedtime.    . Fluticasone-Umeclidin-Vilant (TRELEGY ELLIPTA) 100-62.5-25 MCG/INH AEPB Inhale 1 puff into the lungs daily.   [name] HUMULIN R U-500 KWIKPEN 500 UNIT/ML kwikpen Inject 100 Units into the skin daily.  03/24/2020: Confirmed dose with daughter   . ipratropium-albuterol (DUONEB) 0.5-2.5 (3) MG/3ML SOLN Take 28m by nebulization every 4-6 hours if needed (Patient taking differently: Inhale 3 mLs into the lungs every 4 (four) hours as needed (wheezing/shortness of breath.). )   . levothyroxine (SYNTHROID, LEVOTHROID) 75 MCG tablet Take 75 mcg by mouth daily before breakfast.   . liraglutide (VICTOZA) 18 MG/3ML SOPN Inject 1.2 mg into the skin every evening.    .Marland Kitchenlosartan (COZAAR) 25 MG tablet Take 25 mg by mouth daily.   . metoCLOPramide (REGLAN) 10 MG tablet Take 10 mg by mouth 3 (three) times daily.   . nebivolol (BYSTOLIC) 10 MG tablet Take 1 tablet (10 mg total) by mouth daily.   .Marland Kitchennystatin-triamcinolone (MYCOLOG II) cream Apply 1 application topically 2 (two) times daily as needed (yeast in skin folds).    . pantoprazole (PROTONIX) 40 MG tablet Take 1 tablet (40 mg total) by mouth 2 (two) times daily.   . pentosan polysulfate (ELMIRON) 100 MG capsule Take 100 mg by mouth 3 (three) times daily.   . promethazine (PHENERGAN) 25 MG tablet Take 25 mg by mouth 2 (two) times daily as  needed for nausea.   . SUMAtriptan (IMITREX) 20 MG/ACT nasal spray Place 20 mg into the nose every 2 (two) hours as needed for migraine or headache. May repeat in 2 hours if headache persists or recurs.   [name] tiZANidine (ZANAFLEX) 4 MG tablet Take 4 mg by mouth 3 (three) times daily. May hold one dose if needed to operate motor vehicle. 03/30/2020: Taking at night  . Cholecalciferol (VITAMIN D3) 1000 units CAPS Take 1,000 Units by mouth daily.   [name] ibuprofen (ADVIL,MOTRIN) 800 MG tablet Take 800 mg by mouth every 8 (eight) hours as needed (pain).     No  facility-administered encounter medications on file as of 03/30/2020.    Functional Status:  In your present state of health, do you have any difficulty performing the following activities: 03/30/2020 09/27/2019  Hearing? N N  Vision? N N  Difficulty concentrating or making decisions? Y N  Comment recent stroke -  Walking or climbing stairs? Y N  Comment recent stroke -  Dressing or bathing? N N  Doing errands, shopping? Aggie Moats  Comment daughter assists -  Conservation officer, nature and eating ? Y N  Comment family helps -  Using the Toilet? N N  In the past six months, have you accidently leaked urine? N N  Do you have problems with loss of bowel control? N N  Managing your Medications? Aggie Moats  Comment daughter [place] your Finances? Aggie Moats  Comment daughter [name] helps -  Housekeeping or managing your Housekeeping? Y N  Comment daughter [name] Helps -  Some recent data might be hidden    Fall/Depression Screening: Fall Risk  03/30/2020 09/27/2019 07/31/2017  Falls in the past year? 1 0 No  Comment - - -  Number falls in past yr: 1 - -  Injury with Fall? 0 - -  Risk Factor Category  - - -  Risk for fall due to : History of fall(s) - -  Follow up Falls prevention discussed - -   PHQ 2/9 Scores 03/30/2020 09/27/2019 07/31/2017 07/03/2017 05/18/2017 03/24/2017 01/26/2017  PHQ - 2 Score 1 1 1 1 1 1  0  PHQ- 9 Score - - - - - - -   SDOH Screenings   Alcohol Screen:   . Last Alcohol Screening Score (AUDIT): Not on file  Depression (PHQ2-9): Low Risk   . PHQ-2 Score: 1  Financial Resource Strain:   . Difficulty of Paying Living Expenses: Not on file  Food Insecurity: No Food Insecurity  . Worried About Charity fundraiser in the Last Year: Never true  . Ran Out of Food in the Last Year: Never true  Housing:   . Last Housing Risk Score: Not on file  Physical Activity:   . Days of Exercise per Week: Not on file  . Minutes of Exercise per Session: Not on file  Social Connections:   .  Frequency of Communication with Friends and Family: Not on file  . Frequency of Social Gatherings with Friends and Family: Not on file  . Attends Religious Services: Not on file  . Active Member of Clubs or Organizations: Not on file  . Attends Archivist Meetings: Not on file  . Marital Status: Not on file  Stress:   . Feeling of Stress : Not on file  Tobacco Use: Medium Risk  . Smoking Tobacco Use: Former Smoker  . Smokeless Tobacco Use: Never Used  Transportation Needs: No Transportation Needs  .  Lack of Transportation (Medical): No  . Lack of Transportation (Non-Medical): No     Assessment: Patient with recent stroke that has affected her thinking. Patient depending on family to assist with care at this time.    Plan:  [facility] CM Care Plan Problem One     Most Recent Value  Care Plan Problem One knowledge deficit related to diabetes type 2  Role Documenting the Problem One Care Management Telephonic Coordinator  Care Plan for Problem One Active  THN Long Term Goal  Over the next 90 days, patient will demonstrate and/or verbalize understanding of self-health management for long term care of Diabetes.  THN Long Term Goal Start Date 03/30/20  Interventions for Problem One Long Term Goal RN CM discussed with daughter importance of blood sugar control and diet modifications.    [facility] CM Care Plan Problem Two     Most Recent Value  Care Plan Problem Two Recent Stroke  Role Documenting the Problem Two Care Management Telephonic Coordinator  Care Plan for Problem Two Active  Interventions for Problem Two Long Term Goal  Reviewed with daughter signs of stroke and stressed the importance of seeking medical attention immediately.  THN Long Term Goal Patient will not experience unplanned hospital readmission within the next 31 days.  THN Long Term Goal Start Date 03/30/20  [facility] CM Short Term Goal #1  Patient will actively participate in home health services within the next 14 days   THN CM Short Term Goal #1 Start Date 03/30/20  Interventions for Short Term Goal #2  Reiewed that home health has been ordered.  Provided contact information for [facility].  THN CM Short Term Goal #2  Patient will have follow up scheduled with PCP within 14 days.  THN CM Short Term Goal #2 Start Date 03/30/20  Interventions for Short Term Goal #2 Discussed importance of physician follow up. Assessed transportation needs     RN CM will provide ongoing education and support to caregiver/patient through phone calls.   RN CM will send welcome packet with consent to patient.   RN CM will send initial barriers letter, assessment, and care plan to primary care physician.   RN CM will contact patient within the next 2 weeks and caregiver/patient agrees to next contact.     [name], RN, MSN [place] Management Care Management Coordinator Direct Line [phone] Cell [phone] Toll Free: [phone]  Fax: [phone]

## 2020-03-31 DIAGNOSIS — J449 Chronic obstructive pulmonary disease, unspecified: Secondary | ICD-10-CM | POA: Diagnosis not present

## 2020-03-31 DIAGNOSIS — I10 Essential (primary) hypertension: Secondary | ICD-10-CM | POA: Diagnosis not present

## 2020-03-31 DIAGNOSIS — E1165 Type 2 diabetes mellitus with hyperglycemia: Secondary | ICD-10-CM | POA: Diagnosis not present

## 2020-03-31 DIAGNOSIS — G9341 Metabolic encephalopathy: Secondary | ICD-10-CM | POA: Diagnosis not present

## 2020-03-31 DIAGNOSIS — I5030 Unspecified diastolic (congestive) heart failure: Secondary | ICD-10-CM | POA: Diagnosis not present

## 2020-03-31 DIAGNOSIS — E039 Hypothyroidism, unspecified: Secondary | ICD-10-CM | POA: Diagnosis not present

## 2020-03-31 DIAGNOSIS — I110 Hypertensive heart disease with heart failure: Secondary | ICD-10-CM | POA: Diagnosis not present

## 2020-03-31 DIAGNOSIS — E1169 Type 2 diabetes mellitus with other specified complication: Secondary | ICD-10-CM | POA: Diagnosis not present

## 2020-03-31 DIAGNOSIS — I69398 Other sequelae of cerebral infarction: Secondary | ICD-10-CM | POA: Diagnosis not present

## 2020-03-31 DIAGNOSIS — K760 Fatty (change of) liver, not elsewhere classified: Secondary | ICD-10-CM | POA: Diagnosis not present

## 2020-04-02 DIAGNOSIS — G9341 Metabolic encephalopathy: Secondary | ICD-10-CM | POA: Diagnosis not present

## 2020-04-02 DIAGNOSIS — E1165 Type 2 diabetes mellitus with hyperglycemia: Secondary | ICD-10-CM | POA: Diagnosis not present

## 2020-04-02 DIAGNOSIS — J449 Chronic obstructive pulmonary disease, unspecified: Secondary | ICD-10-CM | POA: Diagnosis not present

## 2020-04-02 DIAGNOSIS — I69398 Other sequelae of cerebral infarction: Secondary | ICD-10-CM | POA: Diagnosis not present

## 2020-04-02 DIAGNOSIS — I5030 Unspecified diastolic (congestive) heart failure: Secondary | ICD-10-CM | POA: Diagnosis not present

## 2020-04-02 DIAGNOSIS — E1169 Type 2 diabetes mellitus with other specified complication: Secondary | ICD-10-CM | POA: Diagnosis not present

## 2020-04-02 DIAGNOSIS — K760 Fatty (change of) liver, not elsewhere classified: Secondary | ICD-10-CM | POA: Diagnosis not present

## 2020-04-02 DIAGNOSIS — I110 Hypertensive heart disease with heart failure: Secondary | ICD-10-CM | POA: Diagnosis not present

## 2020-04-02 DIAGNOSIS — E039 Hypothyroidism, unspecified: Secondary | ICD-10-CM | POA: Diagnosis not present

## 2020-04-03 ENCOUNTER — Ambulatory Visit (INDEPENDENT_AMBULATORY_CARE_PROVIDER_SITE_OTHER): Payer: Medicare HMO | Admitting: Emergency Medicine

## 2020-04-03 ENCOUNTER — Ambulatory Visit: Payer: Self-pay

## 2020-04-03 ENCOUNTER — Other Ambulatory Visit: Payer: Self-pay

## 2020-04-03 DIAGNOSIS — I639 Cerebral infarction, unspecified: Secondary | ICD-10-CM

## 2020-04-03 LAB — CUP PACEART INCLINIC DEVICE CHECK
Date Time Interrogation Session: 20210907172447
Implantable Pulse Generator Implant Date: 20210831

## 2020-04-03 NOTE — Progress Notes (Signed)
ILR wound check in clinic. Steri strips removed. Wound well healed. R-waves 0.25 mV.  Home monitor transmitting nightly. No episodes. Questions answered.

## 2020-04-05 DIAGNOSIS — G9341 Metabolic encephalopathy: Secondary | ICD-10-CM | POA: Diagnosis not present

## 2020-04-05 DIAGNOSIS — I69398 Other sequelae of cerebral infarction: Secondary | ICD-10-CM | POA: Diagnosis not present

## 2020-04-05 DIAGNOSIS — K760 Fatty (change of) liver, not elsewhere classified: Secondary | ICD-10-CM | POA: Diagnosis not present

## 2020-04-05 DIAGNOSIS — E039 Hypothyroidism, unspecified: Secondary | ICD-10-CM | POA: Diagnosis not present

## 2020-04-05 DIAGNOSIS — I5030 Unspecified diastolic (congestive) heart failure: Secondary | ICD-10-CM | POA: Diagnosis not present

## 2020-04-05 DIAGNOSIS — I110 Hypertensive heart disease with heart failure: Secondary | ICD-10-CM | POA: Diagnosis not present

## 2020-04-05 DIAGNOSIS — E1169 Type 2 diabetes mellitus with other specified complication: Secondary | ICD-10-CM | POA: Diagnosis not present

## 2020-04-05 DIAGNOSIS — E1165 Type 2 diabetes mellitus with hyperglycemia: Secondary | ICD-10-CM | POA: Diagnosis not present

## 2020-04-05 DIAGNOSIS — J449 Chronic obstructive pulmonary disease, unspecified: Secondary | ICD-10-CM | POA: Diagnosis not present

## 2020-04-06 DIAGNOSIS — I110 Hypertensive heart disease with heart failure: Secondary | ICD-10-CM | POA: Diagnosis not present

## 2020-04-06 DIAGNOSIS — I5030 Unspecified diastolic (congestive) heart failure: Secondary | ICD-10-CM | POA: Diagnosis not present

## 2020-04-06 DIAGNOSIS — J449 Chronic obstructive pulmonary disease, unspecified: Secondary | ICD-10-CM | POA: Diagnosis not present

## 2020-04-06 DIAGNOSIS — E1165 Type 2 diabetes mellitus with hyperglycemia: Secondary | ICD-10-CM | POA: Diagnosis not present

## 2020-04-06 DIAGNOSIS — E1169 Type 2 diabetes mellitus with other specified complication: Secondary | ICD-10-CM | POA: Diagnosis not present

## 2020-04-06 DIAGNOSIS — E039 Hypothyroidism, unspecified: Secondary | ICD-10-CM | POA: Diagnosis not present

## 2020-04-06 DIAGNOSIS — I69398 Other sequelae of cerebral infarction: Secondary | ICD-10-CM | POA: Diagnosis not present

## 2020-04-06 DIAGNOSIS — G9341 Metabolic encephalopathy: Secondary | ICD-10-CM | POA: Diagnosis not present

## 2020-04-06 DIAGNOSIS — K760 Fatty (change of) liver, not elsewhere classified: Secondary | ICD-10-CM | POA: Diagnosis not present

## 2020-04-09 ENCOUNTER — Other Ambulatory Visit: Payer: Self-pay

## 2020-04-09 DIAGNOSIS — K760 Fatty (change of) liver, not elsewhere classified: Secondary | ICD-10-CM | POA: Diagnosis not present

## 2020-04-09 DIAGNOSIS — I5030 Unspecified diastolic (congestive) heart failure: Secondary | ICD-10-CM | POA: Diagnosis not present

## 2020-04-09 DIAGNOSIS — E1169 Type 2 diabetes mellitus with other specified complication: Secondary | ICD-10-CM | POA: Diagnosis not present

## 2020-04-09 DIAGNOSIS — I69398 Other sequelae of cerebral infarction: Secondary | ICD-10-CM | POA: Diagnosis not present

## 2020-04-09 DIAGNOSIS — E1165 Type 2 diabetes mellitus with hyperglycemia: Secondary | ICD-10-CM | POA: Diagnosis not present

## 2020-04-09 DIAGNOSIS — E039 Hypothyroidism, unspecified: Secondary | ICD-10-CM | POA: Diagnosis not present

## 2020-04-09 DIAGNOSIS — J449 Chronic obstructive pulmonary disease, unspecified: Secondary | ICD-10-CM | POA: Diagnosis not present

## 2020-04-09 DIAGNOSIS — G9341 Metabolic encephalopathy: Secondary | ICD-10-CM | POA: Diagnosis not present

## 2020-04-09 DIAGNOSIS — I110 Hypertensive heart disease with heart failure: Secondary | ICD-10-CM | POA: Diagnosis not present

## 2020-04-09 NOTE — Patient Outreach (Addendum)
[facility]) Care Management  [place]  04/09/2020   Cheryl [name] Dec 24, 1965 [id number]  Subjective: Telephone call to daughter Ennis Forts.  She states patient doing much better and that patient is active with home health. Patient has follow up with stroke doctor scheduled. Chelsea states that she is now using the cane for ambulation instead of walker.  She states she is scheduled to go back to work on 04-18-20 and she is working to get patient independent with medications and care without reminders.  Discussed patient safety at home alone.  Patient has reminders on her phone and patient's mother lives right next door to patient. Patient blood sugars per [facility] are in the 100 range and patient able to give self insulin to cover her sugars.   EMMI-Stroke: Feeling worse overall? Yes  New problems walking/talking/speaking/seeing? Yes   Addressed red alerts with daughter. She denies any problems.    Objective:   Encounter Medications:  Outpatient Encounter Medications as of 04/09/2020  Medication Sig Note  . albuterol (VENTOLIN HFA) 108 (90 Base) MCG/ACT inhaler INHALE 2 PUFFS EVERY 6 HOURS AS NEEDED FOR WHEEZING OR SHORTNESS OF BREATH.   [name] aspirin EC 81 MG EC tablet Take 1 tablet (81 mg total) by mouth daily for 21 days. Swallow whole.   [name] atorvastatin (LIPITOR) 80 MG tablet Take 1 tablet (80 mg total) by mouth daily.   . Cholecalciferol (VITAMIN D3) 1000 units CAPS Take 1,000 Units by mouth daily.   . clopidogrel (PLAVIX) 75 MG tablet Take 1 tablet (75 mg total) by mouth daily.   . diazepam (VALIUM) 10 MG tablet Take 10 mg by mouth 2 (two) times daily as needed (panic attacks.).    [name] diltiazem (CARDIZEM CD) 240 MG 24 hr capsule TAKE 1 CAPSULE BY MOUTH EVERY MORNING ON AN EMPTY STOMACH - [name] DENIED, FAXED DR Johnsie Cancel 10/22/15 SS (Patient taking differently: Take 240 mg by mouth daily. )   . FLUoxetine (PROZAC) 40 MG capsule Take 80 mg by mouth at  bedtime.    . Fluticasone-Umeclidin-Vilant (TRELEGY ELLIPTA) 100-62.5-25 MCG/INH AEPB Inhale 1 puff into the lungs daily.   [name] HUMULIN R U-500 KWIKPEN 500 UNIT/ML kwikpen Inject 100 Units into the skin daily.  03/24/2020: Confirmed dose with daughter   . ibuprofen (ADVIL,MOTRIN) 800 MG tablet Take 800 mg by mouth every 8 (eight) hours as needed (pain).    [name] ipratropium-albuterol (DUONEB) 0.5-2.5 (3) MG/3ML SOLN Take 52m by nebulization every 4-6 hours if needed (Patient taking differently: Inhale 3 mLs into the lungs every 4 (four) hours as needed (wheezing/shortness of breath.). )   . levothyroxine (SYNTHROID, LEVOTHROID) 75 MCG tablet Take 75 mcg by mouth daily before breakfast.   . liraglutide (VICTOZA) 18 MG/3ML SOPN Inject 1.2 mg into the skin every evening.    .Marland Kitchenlosartan (COZAAR) 25 MG tablet Take 25 mg by mouth daily.   . metoCLOPramide (REGLAN) 10 MG tablet Take 10 mg by mouth 3 (three) times daily.   . nebivolol (BYSTOLIC) 10 MG tablet Take 1 tablet (10 mg total) by mouth daily.   .Marland Kitchennystatin-triamcinolone (MYCOLOG II) cream Apply 1 application topically 2 (two) times daily as needed (yeast in skin folds).    . pantoprazole (PROTONIX) 40 MG tablet Take 1 tablet (40 mg total) by mouth 2 (two) times daily.   . pentosan polysulfate (ELMIRON) 100 MG capsule Take 100 mg by mouth 3 (three) times daily.   . promethazine (PHENERGAN) 25 MG tablet  Take 25 mg by mouth 2 (two) times daily as needed for nausea.   . SUMAtriptan (IMITREX) 20 MG/ACT nasal spray Place 20 mg into the nose every 2 (two) hours as needed for migraine or headache. May repeat in 2 hours if headache persists or recurs.   [name] tiZANidine (ZANAFLEX) 4 MG tablet Take 4 mg by mouth 3 (three) times daily. May hold one dose if needed to operate motor vehicle. 03/30/2020: Taking at night   No facility-administered encounter medications on file as of 04/09/2020.    Functional Status:  In your present state of health, do you have any difficulty  performing the following activities: 03/30/2020 09/27/2019  Hearing? N N  Vision? N N  Difficulty concentrating or making decisions? Y N  Comment recent stroke -  Walking or climbing stairs? Y N  Comment recent stroke -  Dressing or bathing? N N  Doing errands, shopping? Aggie Moats  Comment daughter assists -  Conservation officer, nature and eating ? Y N  Comment family helps -  Using the Toilet? N N  In the past six months, have you accidently leaked urine? N N  Do you have problems with loss of bowel control? N N  Managing your Medications? Aggie Moats  Comment daughter [place] your Finances? Aggie Moats  Comment daughter [name] helps -  Housekeeping or managing your Housekeeping? Y N  Comment daughter [name] Helps -  Some recent data might be hidden    Fall/Depression Screening: Fall Risk  03/30/2020 09/27/2019 07/31/2017  Falls in the past year? 1 0 No  Comment - - -  Number falls in past yr: 1 - -  Injury with Fall? 0 - -  Risk Factor Category  - - -  Risk for fall due to : History of fall(s) - -  Follow up Falls prevention discussed - -   PHQ 2/9 Scores 03/30/2020 09/27/2019 07/31/2017 07/03/2017 05/18/2017 03/24/2017 01/26/2017  PHQ - 2 Score 1 1 1 1 1 1  0  PHQ- 9 Score - - - - - - -    Assessment: Patient continues stroke recovery with assistance of family.   Plan:  [facility] CM Care Plan Problem One     Most Recent Value  Care Plan Problem One knowledge deficit related to diabetes type 2  Role Documenting the Problem One Care Management Telephonic Coordinator  Care Plan for Problem One Active  [facility] Long Term Goal  Over the next 90 days, patient will demonstrate and/or verbalize understanding of self-health management for long term care of Diabetes.  [facility]N Long Term Goal Start Date 03/30/20  Interventions for Problem One Long Term Goal RN CM reiterated importance of diabetic control.Margie Billet CM Care Plan Problem Two     Most Recent Value  Care Plan Problem Two Recent Stroke  Role Documenting the  Problem Two Care Management Telephonic Coordinator  Care Plan for Problem Two Active  Interventions for Problem Two Long Term Goal  REviewed EMMI stroke flags. Patient doing good.  Much stronger.  Discussed stroke regression.    [facility]N Long Term Goal Patient will not experience unplanned hospital readmission within the next 31 days.  [facility]N Long Term Goal Start Date 03/30/20  [facility] CM Short Term Goal #1  Patient will actively participate in home health services within the next 14 days  THN CM Short Term Goal #1 Start Date 03/30/20  [facility] CM Short Term Goal #1 Met Date  04/09/20  THN CM  Short Term Goal #2  Patient will have follow up scheduled with PCP within 14 days.  [facility]N CM Short Term Goal #2 Start Date 03/30/20     RN CM will contact again this month and daughter agreeable.    [name], RN, MSN [place] Management Care Management Coordinator Direct Line [phone] Cell [phone] Toll Free: [phone]  Fax: [phone]

## 2020-04-10 ENCOUNTER — Other Ambulatory Visit: Payer: Self-pay | Admitting: Nurse Practitioner

## 2020-04-10 DIAGNOSIS — K7581 Nonalcoholic steatohepatitis (NASH): Secondary | ICD-10-CM

## 2020-04-10 DIAGNOSIS — K7400 Hepatic fibrosis, unspecified: Secondary | ICD-10-CM | POA: Diagnosis not present

## 2020-04-10 DIAGNOSIS — E119 Type 2 diabetes mellitus without complications: Secondary | ICD-10-CM | POA: Diagnosis not present

## 2020-04-10 DIAGNOSIS — Z8673 Personal history of transient ischemic attack (TIA), and cerebral infarction without residual deficits: Secondary | ICD-10-CM | POA: Diagnosis not present

## 2020-04-10 DIAGNOSIS — I10 Essential (primary) hypertension: Secondary | ICD-10-CM | POA: Diagnosis not present

## 2020-04-11 DIAGNOSIS — I69398 Other sequelae of cerebral infarction: Secondary | ICD-10-CM | POA: Diagnosis not present

## 2020-04-11 DIAGNOSIS — I5030 Unspecified diastolic (congestive) heart failure: Secondary | ICD-10-CM | POA: Diagnosis not present

## 2020-04-11 DIAGNOSIS — E1165 Type 2 diabetes mellitus with hyperglycemia: Secondary | ICD-10-CM | POA: Diagnosis not present

## 2020-04-11 DIAGNOSIS — J449 Chronic obstructive pulmonary disease, unspecified: Secondary | ICD-10-CM | POA: Diagnosis not present

## 2020-04-11 DIAGNOSIS — K760 Fatty (change of) liver, not elsewhere classified: Secondary | ICD-10-CM | POA: Diagnosis not present

## 2020-04-11 DIAGNOSIS — I110 Hypertensive heart disease with heart failure: Secondary | ICD-10-CM | POA: Diagnosis not present

## 2020-04-11 DIAGNOSIS — G9341 Metabolic encephalopathy: Secondary | ICD-10-CM | POA: Diagnosis not present

## 2020-04-11 DIAGNOSIS — E039 Hypothyroidism, unspecified: Secondary | ICD-10-CM | POA: Diagnosis not present

## 2020-04-11 DIAGNOSIS — E1169 Type 2 diabetes mellitus with other specified complication: Secondary | ICD-10-CM | POA: Diagnosis not present

## 2020-04-12 ENCOUNTER — Telehealth: Payer: Self-pay | Admitting: *Deleted

## 2020-04-12 DIAGNOSIS — I5030 Unspecified diastolic (congestive) heart failure: Secondary | ICD-10-CM | POA: Diagnosis not present

## 2020-04-12 DIAGNOSIS — I69398 Other sequelae of cerebral infarction: Secondary | ICD-10-CM | POA: Diagnosis not present

## 2020-04-12 DIAGNOSIS — I110 Hypertensive heart disease with heart failure: Secondary | ICD-10-CM | POA: Diagnosis not present

## 2020-04-12 DIAGNOSIS — G9341 Metabolic encephalopathy: Secondary | ICD-10-CM | POA: Diagnosis not present

## 2020-04-12 DIAGNOSIS — E039 Hypothyroidism, unspecified: Secondary | ICD-10-CM | POA: Diagnosis not present

## 2020-04-12 DIAGNOSIS — E1165 Type 2 diabetes mellitus with hyperglycemia: Secondary | ICD-10-CM | POA: Diagnosis not present

## 2020-04-12 DIAGNOSIS — K760 Fatty (change of) liver, not elsewhere classified: Secondary | ICD-10-CM | POA: Diagnosis not present

## 2020-04-12 DIAGNOSIS — E1169 Type 2 diabetes mellitus with other specified complication: Secondary | ICD-10-CM | POA: Diagnosis not present

## 2020-04-12 DIAGNOSIS — J449 Chronic obstructive pulmonary disease, unspecified: Secondary | ICD-10-CM | POA: Diagnosis not present

## 2020-04-12 MED ORDER — CLOPIDOGREL BISULFATE 75 MG PO TABS: 75.0000 mg | ORAL_TABLET | Freq: Every day | ORAL | 0 refills | Status: DC

## 2020-04-12 NOTE — Telephone Encounter (Signed)
Yes kindly do so

## 2020-04-12 NOTE — Telephone Encounter (Signed)
We received a new Rx request for Clopidogrel 75 mg tablet. Pt has an initial hospital follow up appointment pending for 05/28/20 with Dr Leonie Man and was d/c from hospital on 03/29/20 with a 30 day Rx for Plavix.

## 2020-04-12 NOTE — Telephone Encounter (Signed)
Two month supply of Plavix 75 mg e-scribed to [facility] mail order pharmacy.

## 2020-04-12 NOTE — Addendum Note (Signed)
Addended by: Gildardo Griffes on: 04/12/2020 05:10 PM   Modules accepted: Orders

## 2020-04-13 DIAGNOSIS — K760 Fatty (change of) liver, not elsewhere classified: Secondary | ICD-10-CM | POA: Diagnosis not present

## 2020-04-13 DIAGNOSIS — I110 Hypertensive heart disease with heart failure: Secondary | ICD-10-CM | POA: Diagnosis not present

## 2020-04-13 DIAGNOSIS — E1169 Type 2 diabetes mellitus with other specified complication: Secondary | ICD-10-CM | POA: Diagnosis not present

## 2020-04-13 DIAGNOSIS — E039 Hypothyroidism, unspecified: Secondary | ICD-10-CM | POA: Diagnosis not present

## 2020-04-13 DIAGNOSIS — J449 Chronic obstructive pulmonary disease, unspecified: Secondary | ICD-10-CM | POA: Diagnosis not present

## 2020-04-13 DIAGNOSIS — I5030 Unspecified diastolic (congestive) heart failure: Secondary | ICD-10-CM | POA: Diagnosis not present

## 2020-04-13 DIAGNOSIS — I69398 Other sequelae of cerebral infarction: Secondary | ICD-10-CM | POA: Diagnosis not present

## 2020-04-13 DIAGNOSIS — E1165 Type 2 diabetes mellitus with hyperglycemia: Secondary | ICD-10-CM | POA: Diagnosis not present

## 2020-04-13 DIAGNOSIS — G9341 Metabolic encephalopathy: Secondary | ICD-10-CM | POA: Diagnosis not present

## 2020-04-16 ENCOUNTER — Ambulatory Visit
Admission: RE | Admit: 2020-04-16 | Discharge: 2020-04-16 | Disposition: A | Discharge status: Home / Self Care | Payer: Medicare HMO | Source: Ambulatory Visit | Attending: Nurse Practitioner | Admitting: Nurse Practitioner

## 2020-04-16 DIAGNOSIS — G9341 Metabolic encephalopathy: Secondary | ICD-10-CM | POA: Diagnosis not present

## 2020-04-16 DIAGNOSIS — E039 Hypothyroidism, unspecified: Secondary | ICD-10-CM | POA: Diagnosis not present

## 2020-04-16 DIAGNOSIS — I110 Hypertensive heart disease with heart failure: Secondary | ICD-10-CM | POA: Diagnosis not present

## 2020-04-16 DIAGNOSIS — E1165 Type 2 diabetes mellitus with hyperglycemia: Secondary | ICD-10-CM | POA: Diagnosis not present

## 2020-04-16 DIAGNOSIS — K760 Fatty (change of) liver, not elsewhere classified: Secondary | ICD-10-CM | POA: Diagnosis not present

## 2020-04-16 DIAGNOSIS — J449 Chronic obstructive pulmonary disease, unspecified: Secondary | ICD-10-CM | POA: Diagnosis not present

## 2020-04-16 DIAGNOSIS — I69398 Other sequelae of cerebral infarction: Secondary | ICD-10-CM | POA: Diagnosis not present

## 2020-04-16 DIAGNOSIS — K7581 Nonalcoholic steatohepatitis (NASH): Secondary | ICD-10-CM

## 2020-04-16 DIAGNOSIS — I5030 Unspecified diastolic (congestive) heart failure: Secondary | ICD-10-CM | POA: Diagnosis not present

## 2020-04-16 DIAGNOSIS — E1169 Type 2 diabetes mellitus with other specified complication: Secondary | ICD-10-CM | POA: Diagnosis not present

## 2020-04-17 DIAGNOSIS — E1142 Type 2 diabetes mellitus with diabetic polyneuropathy: Secondary | ICD-10-CM | POA: Diagnosis not present

## 2020-04-17 DIAGNOSIS — K7581 Nonalcoholic steatohepatitis (NASH): Secondary | ICD-10-CM | POA: Diagnosis not present

## 2020-04-17 DIAGNOSIS — G9341 Metabolic encephalopathy: Secondary | ICD-10-CM | POA: Diagnosis not present

## 2020-04-17 DIAGNOSIS — E782 Mixed hyperlipidemia: Secondary | ICD-10-CM | POA: Diagnosis not present

## 2020-04-17 DIAGNOSIS — E669 Obesity, unspecified: Secondary | ICD-10-CM | POA: Diagnosis not present

## 2020-04-17 DIAGNOSIS — G894 Chronic pain syndrome: Secondary | ICD-10-CM | POA: Diagnosis not present

## 2020-04-17 DIAGNOSIS — I639 Cerebral infarction, unspecified: Secondary | ICD-10-CM | POA: Diagnosis not present

## 2020-04-17 DIAGNOSIS — I10 Essential (primary) hypertension: Secondary | ICD-10-CM | POA: Diagnosis not present

## 2020-04-18 ENCOUNTER — Ambulatory Visit: Payer: Medicare HMO | Admitting: Gastroenterology

## 2020-04-18 DIAGNOSIS — E1165 Type 2 diabetes mellitus with hyperglycemia: Secondary | ICD-10-CM | POA: Diagnosis not present

## 2020-04-18 DIAGNOSIS — I110 Hypertensive heart disease with heart failure: Secondary | ICD-10-CM | POA: Diagnosis not present

## 2020-04-18 DIAGNOSIS — J449 Chronic obstructive pulmonary disease, unspecified: Secondary | ICD-10-CM | POA: Diagnosis not present

## 2020-04-18 DIAGNOSIS — E1169 Type 2 diabetes mellitus with other specified complication: Secondary | ICD-10-CM | POA: Diagnosis not present

## 2020-04-18 DIAGNOSIS — G9341 Metabolic encephalopathy: Secondary | ICD-10-CM | POA: Diagnosis not present

## 2020-04-18 DIAGNOSIS — I69398 Other sequelae of cerebral infarction: Secondary | ICD-10-CM | POA: Diagnosis not present

## 2020-04-18 DIAGNOSIS — E039 Hypothyroidism, unspecified: Secondary | ICD-10-CM | POA: Diagnosis not present

## 2020-04-18 DIAGNOSIS — K760 Fatty (change of) liver, not elsewhere classified: Secondary | ICD-10-CM | POA: Diagnosis not present

## 2020-04-18 DIAGNOSIS — I5030 Unspecified diastolic (congestive) heart failure: Secondary | ICD-10-CM | POA: Diagnosis not present

## 2020-04-19 DIAGNOSIS — K760 Fatty (change of) liver, not elsewhere classified: Secondary | ICD-10-CM | POA: Diagnosis not present

## 2020-04-19 DIAGNOSIS — I5030 Unspecified diastolic (congestive) heart failure: Secondary | ICD-10-CM | POA: Diagnosis not present

## 2020-04-19 DIAGNOSIS — I110 Hypertensive heart disease with heart failure: Secondary | ICD-10-CM | POA: Diagnosis not present

## 2020-04-19 DIAGNOSIS — I69398 Other sequelae of cerebral infarction: Secondary | ICD-10-CM | POA: Diagnosis not present

## 2020-04-19 DIAGNOSIS — J449 Chronic obstructive pulmonary disease, unspecified: Secondary | ICD-10-CM | POA: Diagnosis not present

## 2020-04-19 DIAGNOSIS — E039 Hypothyroidism, unspecified: Secondary | ICD-10-CM | POA: Diagnosis not present

## 2020-04-19 DIAGNOSIS — E1169 Type 2 diabetes mellitus with other specified complication: Secondary | ICD-10-CM | POA: Diagnosis not present

## 2020-04-19 DIAGNOSIS — G9341 Metabolic encephalopathy: Secondary | ICD-10-CM | POA: Diagnosis not present

## 2020-04-19 DIAGNOSIS — E1165 Type 2 diabetes mellitus with hyperglycemia: Secondary | ICD-10-CM | POA: Diagnosis not present

## 2020-04-20 DIAGNOSIS — I69398 Other sequelae of cerebral infarction: Secondary | ICD-10-CM | POA: Diagnosis not present

## 2020-04-20 DIAGNOSIS — J449 Chronic obstructive pulmonary disease, unspecified: Secondary | ICD-10-CM | POA: Diagnosis not present

## 2020-04-20 DIAGNOSIS — E1165 Type 2 diabetes mellitus with hyperglycemia: Secondary | ICD-10-CM | POA: Diagnosis not present

## 2020-04-20 DIAGNOSIS — E1169 Type 2 diabetes mellitus with other specified complication: Secondary | ICD-10-CM | POA: Diagnosis not present

## 2020-04-20 DIAGNOSIS — G9341 Metabolic encephalopathy: Secondary | ICD-10-CM | POA: Diagnosis not present

## 2020-04-20 DIAGNOSIS — K760 Fatty (change of) liver, not elsewhere classified: Secondary | ICD-10-CM | POA: Diagnosis not present

## 2020-04-20 DIAGNOSIS — I110 Hypertensive heart disease with heart failure: Secondary | ICD-10-CM | POA: Diagnosis not present

## 2020-04-20 DIAGNOSIS — E039 Hypothyroidism, unspecified: Secondary | ICD-10-CM | POA: Diagnosis not present

## 2020-04-20 DIAGNOSIS — I5030 Unspecified diastolic (congestive) heart failure: Secondary | ICD-10-CM | POA: Diagnosis not present

## 2020-04-23 ENCOUNTER — Other Ambulatory Visit: Payer: Self-pay

## 2020-04-23 NOTE — Patient Outreach (Signed)
[facility]) Care Management  04/23/2020  Cheryl [name] 23-Aug-1965 [id number]   Telephone call to daughter [name] for follow up. No answer. HIPAA compliant voice message left.  Plan: RN CM will wait return call. If no return call will attempt again within the next 4 business days.  [name], RN, MSN [place] Management Care Management Coordinator Direct Line [phone] Cell [phone] Toll Free: [phone]  Fax: [phone]

## 2020-04-23 NOTE — Progress Notes (Deleted)
04/23/2020 Cheryl [name]   1965/09/24  [id number]  Primary Physician [name], MD Primary Cardiologist: Dr. Johnsie Cancel  Electrophysiologist: None   Reason for Visit/CC: Cheryl [name] / HTN   HPI:  Cheryl [name] is a 54 y.o. female with cardiac risk factors include poorly controlled IDDM, HTN, HLD with statin non compliance former smoker and family history of premature CAD. Cath 2019 with normal coronary arteries    03/29/20 d/c from hospital with DKA and stroke Plavix added. This was not likely embolic located in left basal ganglia. TEE 03/27/20 with normal EF negative bubble and no LAA thrombus.  ILR implanted by EP  ***  Cardiac Studies  Procedures [facility] 07/2017  RIGHT/LEFT HEART CATH AND CORONARY ANGIOGRAPHY  Conclusion   Normal coronary arteries.  Right dominant coronary anatomy.  Normal right heart hemodynamics and pressures.  Normal left ventricular function with ejection fraction 65%.  Normal hemodynamics.    Left Heart  Left Ventricle The left ventricular size is normal. The left ventricular systolic function is normal. LV end diastolic pressure is normal. The left ventricular ejection fraction is greater than 65% by visual estimate. No regional wall motion abnormalities. There is no evidence of mitral regurgitation.  Coronary Diagrams  Diagnostic Dominance: Right  Intervention    2D Echo 2016  Study Conclusions  - Left ventricle: The cavity size was normal. Systolic function was   normal. The estimated ejection fraction was in the range of 60%   to 65%. Wall motion was normal; there were no regional wall   motion abnormalities. Doppler parameters are consistent with   abnormal left ventricular relaxation (grade 1 diastolic   dysfunction). - Left atrium: The atrium was mildly dilated. - Right ventricle: Systolic function was normal. - Right atrium: The atrium was normal in size. - Tricuspid valve: Structurally normal valve. There was trivial    regurgitation. - Pulmonic valve: There was trivial regurgitation. - Pulmonary arteries: Systolic pressure was within the normal   range. - Inferior vena cava: The vessel was normal in size. - Pericardium, extracardiac: There was no pericardial effusion.  Impressions:  - Normal biventricular size and systolic function.   Abnormal relaxation, normal filling pressures.   No significant valvular abnormalities.   No outpatient medications have been marked as taking for the 04/30/20 encounter (Appointment) with Josue Hector, MD.   Allergies  Allergen Reactions  . Influenza Vaccines Anaphylaxis and Swelling    Throat swelling, "flu" symptoms  . Iodinated Diagnostic Agents Shortness Of Breath, Nausea And Vomiting, Nausea Only and Rash  . Iodine-131 Shortness Of Breath, Nausea And Vomiting and Rash  . Iohexol Anaphylaxis, Hives and Swelling     Desc: hives,dyspnea; throat swelling; ok w/ premeds and omnipaque   . Levofloxacin Anaphylaxis and Hives  . Nucynta [Tapentadol Hydrochloride] Other (See Comments)    Hallucinations and  insomnia x3 days  . Pregabalin Other (See Comments)    (lyrica)Reaction-Syncope & unable to speak  . Sulfonamide Derivatives Anaphylaxis  . Vantin [Cefpodoxime] Anaphylaxis, Hives, Rash and Cough  . Vilazodone Hcl Other (See Comments)    (Viibryd) Hallucinations  . Amoxicillin Hives and Itching    Did it involve swelling of the face/tongue/throat, SOB, or low BP? No Did it involve sudden or severe rash/hives, skin peeling, or any reaction on the inside of your mouth or nose? Yes Did you need to seek medical attention at a hospital or doctor's office? No When did it last happen?3-4 years ago If all above answers are "  NO", may proceed with cephalosporin use. .  . Biaxin [Clarithromycin] Hives  . Carbamazepine Itching    (Tegretol)  . Ciprofloxacin Hives  . Dilaudid [Hydromorphone Hcl] Itching and Rash    redness  . Iodides Nausea Only  . Percocet  [Oxycodone-Acetaminophen] Rash   Past Medical History:  Diagnosis Date  . Allergy   . Anxiety   . Asthma   . Cancer ([place])    melanoma 2015 upper Left arm   . Chronic airway obstruction, not elsewhere classified   . Chronic pain    "q where; herniated disc in my tailbone" (02/09/2013)  . DDD (degenerative disc disease)    CERVIAL AND LUMBAR  . Depression   . Edema   . Fatty liver disease, nonalcoholic   . Fibromyalgia    "severe" (02/09/2013)  . Gastroparesis    from DM and chronic narcotic use  . GERD (gastroesophageal reflux disease)   . Human parvovirus infection   . Hyperlipidemia   . Hypertension   . Hypothyroidism   . Interstitial cystitis   . Migraine headache    "weekly; worse lately" (02/09/2013)  . OSA (obstructive sleep apnea)    "have mask;; don't use it; no one came out to check it" (02/09/2013)  . Osteoporosis   . Peripheral neuropathy    "severe" (02/09/2013)  . 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 PONV (postoperative nausea and vomiting)   . Positive PPD   . Shortness of breath    "at anytime; it's gotten worse recently" (02/09/2013)  . Sleep apnea   . Type II diabetes mellitus ([place])   . Vitamin D deficiency   . Vocal cord dysfunction    Family History  Problem Relation Age of Onset  . Coronary artery disease Father   . Diabetes Brother   . Diabetes Brother   . Breast cancer Maternal Aunt   . Breast cancer Cousin   . Breast cancer Cousin   . Colon cancer Neg Hx   . Esophageal cancer Neg Hx   . Liver cancer Neg Hx   . Pancreatic cancer Neg Hx   . Rectal cancer Neg Hx   . Stomach cancer Neg Hx    Past Surgical History:  Procedure Laterality Date  . ABDOMINAL HYSTERECTOMY  1993  . ANTERIOR CERVICAL DECOMP/DISCECTOMY FUSION  2004  . arm surgery     left 2018  . BRONCHOSCOPY  06/28/2019  . BUBBLE STUDY  03/27/2020   Procedure: BUBBLE STUDY;  Surgeon: Josue Hector, MD;  Location: [facility] ENDOSCOPY;   Service: Cardiovascular;;  . CARPAL TUNNEL RELEASE Bilateral ?1980's  . CHOLECYSTECTOMY  ?1990  . COLONOSCOPY    . HIP SURGERY Right 2002   "did something to the tibula band" (02/09/2013)  . KNEE ARTHROSCOPY Left 1990's   "fell; clot behind knee cap had to be removed" (02/09/2013)  . LOOP RECORDER INSERTION N/A 03/27/2020   Procedure: LOOP RECORDER INSERTION;  Surgeon: Thompson Grayer, MD;  Location: [facility] CV LAB;  Service: Cardiovascular;  Laterality: N/A;  . NASAL SINUS SURGERY  1986; 1990's   "i've had 3" (02/09/2013)  . RIGHT/LEFT HEART CATH AND CORONARY ANGIOGRAPHY N/A 08/26/2017   Procedure: RIGHT/LEFT HEART CATH AND CORONARY ANGIOGRAPHY;  Surgeon: Belva Crome, MD;  Location: [place] CV LAB;  Service: Cardiovascular;  Laterality: N/A;  . SHOULDER ARTHROSCOPY W/ ROTATOR CUFF REPAIR Right    "had to go deep in rotator cuff" (02/09/2013)  . TEE  WITHOUT CARDIOVERSION N/A 03/27/2020   Procedure: TRANSESOPHAGEAL ECHOCARDIOGRAM (TEE);  Surgeon: Josue Hector, MD;  Location: [facility] ENDOSCOPY;  Service: Cardiovascular;  Laterality: N/A;  . VIDEO BRONCHOSCOPY Bilateral 06/28/2019   Procedure: VIDEO BRONCHOSCOPY WITH FLUORO;  Surgeon: Marshell Garfinkel, MD;  Location: [facility] ENDOSCOPY;  Service: Cardiopulmonary;  Laterality: Bilateral;   Social History   Socioeconomic History  . Marital status: Divorced    Spouse name: Not on file  . Number of children: Not on file  . Years of education: Not on file  . Highest education level: Not on file  Occupational History  . Not on file  Tobacco Use  . Smoking status: Former Smoker    Packs/day: 0.10    Years: 3.00    Pack years: 0.30    Types: Cigarettes    Quit date: 2010    Years since quitting: 11.7  . Smokeless tobacco: Never Used  . Tobacco comment: only smokes in high school then every now and then  Vaping Use  . Vaping Use: Never used  Substance and Sexual Activity  . Alcohol use: No  . Drug use: No  . Sexual activity: Never  Other  Topics Concern  . Not on file  Social History Narrative   Lives with daughter in a one story home.  On disability.  Education: college.     Social Determinants of Health   Financial Resource Strain:   . Difficulty of Paying Living Expenses: Not on file  Food Insecurity: No Food Insecurity  . Worried About Charity fundraiser in the Last Year: Never true  . Ran Out of Food in the Last Year: Never true  Transportation Needs: No Transportation Needs  . Lack of Transportation (Medical): No  . Lack of Transportation (Non-Medical): No  Physical Activity:   . Days of Exercise per Week: Not on file  . Minutes of Exercise per Session: Not on file  Stress:   . Feeling of Stress : Not on file  Social Connections:   . Frequency of Communication with Friends and Family: Not on file  . Frequency of Social Gatherings with Friends and Family: Not on file  . Attends Religious Services: Not on file  . Active Member of Clubs or Organizations: Not on file  . Attends Archivist Meetings: Not on file  . Marital Status: Not on file  Intimate Partner Violence:   . Fear of Current or Ex-Partner: Not on file  . Emotionally Abused: Not on file  . Physically Abused: Not on file  . Sexually Abused: Not on file     Lipid Panel     Component Value Date/Time   CHOL 217 (H) 03/24/2020 0428   TRIG 136 03/24/2020 0428   HDL 82 03/24/2020 0428   CHOLHDL 2.6 03/24/2020 0428   VLDL 27 03/24/2020 0428   LDLCALC 108 (H) 03/24/2020 0428    Review of Systems: General: negative for chills, fever, night sweats or weight changes.  Cardiovascular: negative for chest pain, dyspnea on exertion, edema, orthopnea, palpitations, paroxysmal nocturnal dyspnea or shortness of breath Dermatological: negative for rash Respiratory: negative for cough or wheezing Urologic: negative for hematuria Abdominal: negative for nausea, vomiting, diarrhea, bright red blood per rectum, melena, or hematemesis Neurologic:  negative for visual changes, syncope, or dizziness All other systems reviewed and are otherwise negative except as noted above.  Physical Exam:  Affect appropriate Healthy:  appears stated age 77: normal Neck supple with no adenopathy JVP normal no bruits no  thyromegaly Lungs clear with no wheezing and good diaphragmatic motion Heart:  S1/S2 no murmur, no rub, gallop or click PMI normal Abdomen: benighn, BS positve, no tenderness, no AAA no bruit.  No HSM or HJR Distal pulses intact with no bruits No edema Neuro non-focal Skin warm and dry No muscular weakness   EKG NSR 73 bpm  -- personally reviewed   ASSESSMENT AND PLAN:   1. CVA:  Not likely embolic f/u Dr Leonie Man continue Plavix ILR with no PAF noted with nightly transmission TEE with no SOE and negative bubble 2. DM:  Discussed low carb diet.  Target hemoglobin A1c is 6.5 or less.  Continue current medications. 3. HTN:  Well controlled.  Continue current medications and low sodium Dash type diet.   4. HLD  On lipitor f/u labs with primary    Follow-Up: PRN   Jenkins Rouge MD [facility]

## 2020-04-24 ENCOUNTER — Other Ambulatory Visit: Payer: Self-pay

## 2020-04-24 ENCOUNTER — Other Ambulatory Visit: Payer: Self-pay | Admitting: Neurology

## 2020-04-24 ENCOUNTER — Other Ambulatory Visit: Payer: Self-pay | Admitting: Internal Medicine

## 2020-04-24 NOTE — Patient Outreach (Signed)
Cheryl [name]) Care Management  [place]  04/24/2020   Normalee Sistare Heist 1966-03-22 [id number]  Subjective: Telephone call to patient for follow up. Spoke with patient.  She reports she is doing good. Patient speech is slow but patient answers appropriately.  She reports that her sugars have been increased in the 300's with some lows. Patient able to describe treatment for hypoglycemia.  Patient reports that she is still working with home health since discharge.  Encouraged patient to complete exercises that therapy have reviewed with her when they are not present in order to gain strength. Patient verbalized understanding.  Patient states that her daughter has no gone back to work yet as she does not feel patient is ready to be alone.  Discussed with patient her safety and need for assistance as important.  She verbalized understanding.  Patient to see Dr. [name] next month.  Encouraged patient to continue diet control and medication adherence until she sees him.  She verbalized understanding and voices no concerns.     Encounter Medications:  Outpatient Encounter Medications as of 04/24/2020  Medication Sig Note  . albuterol (VENTOLIN HFA) 108 (90 Base) MCG/ACT inhaler INHALE 2 PUFFS EVERY 6 HOURS AS NEEDED FOR WHEEZING OR SHORTNESS OF BREATH.   [name] atorvastatin (LIPITOR) 80 MG tablet Take 1 tablet (80 mg total) by mouth daily.   . Cholecalciferol (VITAMIN D3) 1000 units CAPS Take 1,000 Units by mouth daily.   . clopidogrel (PLAVIX) 75 MG tablet Take 1 tablet (75 mg total) by mouth daily.   . diazepam (VALIUM) 10 MG tablet Take 10 mg by mouth 2 (two) times daily as needed (panic attacks.).    [name] diltiazem (CARDIZEM CD) 240 MG 24 hr capsule TAKE 1 CAPSULE BY MOUTH EVERY MORNING ON AN EMPTY STOMACH - [name] DENIED, FAXED DR Johnsie Cancel 10/22/15 SS (Patient taking differently: Take 240 mg by mouth daily. )   . FLUoxetine (PROZAC) 40 MG capsule Take 80 mg by mouth at bedtime.      . Fluticasone-Umeclidin-Vilant (TRELEGY ELLIPTA) 100-62.5-25 MCG/INH AEPB Inhale 1 puff into the lungs daily.   [name] HUMULIN R U-500 KWIKPEN 500 UNIT/ML kwikpen Inject 100 Units into the skin daily.  04/24/2020: Taking 130 units am and 30 units in the PM  . ibuprofen (ADVIL,MOTRIN) 800 MG tablet Take 800 mg by mouth every 8 (eight) hours as needed (pain).    [name] ipratropium-albuterol (DUONEB) 0.5-2.5 (3) MG/3ML SOLN Take 76m by nebulization every 4-6 hours if needed (Patient taking differently: Inhale 3 mLs into the lungs every 4 (four) hours as needed (wheezing/shortness of breath.). )   . levothyroxine (SYNTHROID, LEVOTHROID) 75 MCG tablet Take 75 mcg by mouth daily before breakfast.   . liraglutide (VICTOZA) 18 MG/3ML SOPN Inject 1.2 mg into the skin every evening.    .Marland Kitchenlosartan (COZAAR) 25 MG tablet Take 25 mg by mouth daily.   . metoCLOPramide (REGLAN) 10 MG tablet Take 10 mg by mouth 3 (three) times daily.   . nebivolol (BYSTOLIC) 10 MG tablet Take 1 tablet (10 mg total) by mouth daily.   .Marland Kitchennystatin-triamcinolone (MYCOLOG II) cream Apply 1 application topically 2 (two) times daily as needed (yeast in skin folds).    . pantoprazole (PROTONIX) 40 MG tablet Take 1 tablet (40 mg total) by mouth 2 (two) times daily.   . pentosan polysulfate (ELMIRON) 100 MG capsule Take 100 mg by mouth 3 (three) times daily.   . promethazine (PHENERGAN) 25 MG tablet Take  25 mg by mouth 2 (two) times daily as needed for nausea.   . SUMAtriptan (IMITREX) 20 MG/ACT nasal spray Place 20 mg into the nose every 2 (two) hours as needed for migraine or headache. May repeat in 2 hours if headache persists or recurs.   [name] tiZANidine (ZANAFLEX) 4 MG tablet Take 4 mg by mouth 3 (three) times daily. May hold one dose if needed to operate motor vehicle. 03/30/2020: Taking at night   No facility-administered encounter medications on file as of 04/24/2020.    Functional Status:  In your present state of health, do you have any  difficulty performing the following activities: 03/30/2020 09/27/2019  Hearing? N N  Vision? N N  Difficulty concentrating or making decisions? Y N  Comment recent stroke -  Walking or climbing stairs? Y N  Comment recent stroke -  Dressing or bathing? N N  Doing errands, shopping? Aggie Moats  Comment daughter assists -  Conservation officer, nature and eating ? Y N  Comment family helps -  Using the Toilet? N N  In the past six months, have you accidently leaked urine? N N  Do you have problems with loss of bowel control? N N  Managing your Medications? Aggie Moats  Comment daughter [place] your Finances? Aggie Moats  Comment daughter [name] helps -  Housekeeping or managing your Housekeeping? Y N  Comment daughter [name] Helps -  Some recent data might be hidden    Fall/Depression Screening: Fall Risk  03/30/2020 09/27/2019 07/31/2017  Falls in the past year? 1 0 No  Comment - - -  Number falls in past yr: 1 - -  Injury with Fall? 0 - -  Risk Factor Category  - - -  Risk for fall due to : History of fall(s) - -  Follow up Falls prevention discussed - -   PHQ 2/9 Scores 03/30/2020 09/27/2019 07/31/2017 07/03/2017 05/18/2017 03/24/2017 01/26/2017  PHQ - 2 Score 1 1 1 1 1 1  0  PHQ- 9 Score - - - - - - -    Assessment: Patient continue recovery from stroke. Patient continues with blood sugar management.  Patient benefits from [facility] outreach.    Goals Addressed              This Visit's Progress   .  Follow up with Primary Care Provider (pt-stated)        Has appointment with Dr. [name] 05/03/20.    .  Monitor and Manage My Blood Sugar        Follow Up Date 05/24/20   - check blood sugar at prescribed times - check blood sugar if I feel it is too high or too low - take the blood sugar meter to all doctor visits    Why is this important?   Checking your blood sugar at home helps to keep it from getting very high or very low.  Writing the results in a diary or log helps the doctor know how to care for  you.  Your blood sugar log should have the time, date and the results.  Also, write down the amount of insulin or other medicine that you take.  Other information, like what you ate, exercise done and how you were feeling, will also be helpful.     Notes:        Plan:  RN CM will outreach patient again next month and patient agreeable.  [name], RN, MSN [name]  Management Care Management Coordinator Direct Line [phone] Cell [phone] Toll Free: [phone]  Fax: [phone]

## 2020-04-25 ENCOUNTER — Ambulatory Visit: Payer: Self-pay

## 2020-04-25 DIAGNOSIS — E039 Hypothyroidism, unspecified: Secondary | ICD-10-CM | POA: Diagnosis not present

## 2020-04-25 DIAGNOSIS — I69398 Other sequelae of cerebral infarction: Secondary | ICD-10-CM | POA: Diagnosis not present

## 2020-04-25 DIAGNOSIS — E1165 Type 2 diabetes mellitus with hyperglycemia: Secondary | ICD-10-CM | POA: Diagnosis not present

## 2020-04-25 DIAGNOSIS — J449 Chronic obstructive pulmonary disease, unspecified: Secondary | ICD-10-CM | POA: Diagnosis not present

## 2020-04-25 DIAGNOSIS — I5030 Unspecified diastolic (congestive) heart failure: Secondary | ICD-10-CM | POA: Diagnosis not present

## 2020-04-25 DIAGNOSIS — G9341 Metabolic encephalopathy: Secondary | ICD-10-CM | POA: Diagnosis not present

## 2020-04-25 DIAGNOSIS — E1169 Type 2 diabetes mellitus with other specified complication: Secondary | ICD-10-CM | POA: Diagnosis not present

## 2020-04-25 DIAGNOSIS — I110 Hypertensive heart disease with heart failure: Secondary | ICD-10-CM | POA: Diagnosis not present

## 2020-04-25 DIAGNOSIS — K760 Fatty (change of) liver, not elsewhere classified: Secondary | ICD-10-CM | POA: Diagnosis not present

## 2020-04-27 DIAGNOSIS — E1169 Type 2 diabetes mellitus with other specified complication: Secondary | ICD-10-CM | POA: Diagnosis not present

## 2020-04-27 DIAGNOSIS — I110 Hypertensive heart disease with heart failure: Secondary | ICD-10-CM | POA: Diagnosis not present

## 2020-04-27 DIAGNOSIS — J449 Chronic obstructive pulmonary disease, unspecified: Secondary | ICD-10-CM | POA: Diagnosis not present

## 2020-04-27 DIAGNOSIS — E1165 Type 2 diabetes mellitus with hyperglycemia: Secondary | ICD-10-CM | POA: Diagnosis not present

## 2020-04-27 DIAGNOSIS — E039 Hypothyroidism, unspecified: Secondary | ICD-10-CM | POA: Diagnosis not present

## 2020-04-27 DIAGNOSIS — K760 Fatty (change of) liver, not elsewhere classified: Secondary | ICD-10-CM | POA: Diagnosis not present

## 2020-04-27 DIAGNOSIS — I5030 Unspecified diastolic (congestive) heart failure: Secondary | ICD-10-CM | POA: Diagnosis not present

## 2020-04-27 DIAGNOSIS — I69398 Other sequelae of cerebral infarction: Secondary | ICD-10-CM | POA: Diagnosis not present

## 2020-04-27 DIAGNOSIS — G9341 Metabolic encephalopathy: Secondary | ICD-10-CM | POA: Diagnosis not present

## 2020-04-30 ENCOUNTER — Ambulatory Visit (INDEPENDENT_AMBULATORY_CARE_PROVIDER_SITE_OTHER): Payer: Medicare HMO | Admitting: Cardiovascular Disease

## 2020-04-30 ENCOUNTER — Other Ambulatory Visit: Payer: Self-pay

## 2020-04-30 ENCOUNTER — Ambulatory Visit: Payer: Medicare HMO | Admitting: Cardiovascular Disease

## 2020-04-30 ENCOUNTER — Encounter: Payer: Self-pay | Admitting: Cardiovascular Disease

## 2020-04-30 VITALS — BP 140/80 | HR 100 | Ht 62.0 in | Wt 204.2 lb

## 2020-04-30 DIAGNOSIS — I110 Hypertensive heart disease with heart failure: Secondary | ICD-10-CM | POA: Diagnosis not present

## 2020-04-30 DIAGNOSIS — G9341 Metabolic encephalopathy: Secondary | ICD-10-CM | POA: Diagnosis not present

## 2020-04-30 DIAGNOSIS — I6300 Cerebral infarction due to thrombosis of unspecified precerebral artery: Secondary | ICD-10-CM | POA: Diagnosis not present

## 2020-04-30 DIAGNOSIS — Z9889 Other specified postprocedural states: Secondary | ICD-10-CM

## 2020-04-30 DIAGNOSIS — K760 Fatty (change of) liver, not elsewhere classified: Secondary | ICD-10-CM | POA: Diagnosis not present

## 2020-04-30 DIAGNOSIS — I69398 Other sequelae of cerebral infarction: Secondary | ICD-10-CM | POA: Diagnosis not present

## 2020-04-30 DIAGNOSIS — E1165 Type 2 diabetes mellitus with hyperglycemia: Secondary | ICD-10-CM | POA: Diagnosis not present

## 2020-04-30 DIAGNOSIS — E039 Hypothyroidism, unspecified: Secondary | ICD-10-CM | POA: Diagnosis not present

## 2020-04-30 DIAGNOSIS — J449 Chronic obstructive pulmonary disease, unspecified: Secondary | ICD-10-CM | POA: Diagnosis not present

## 2020-04-30 DIAGNOSIS — E1169 Type 2 diabetes mellitus with other specified complication: Secondary | ICD-10-CM | POA: Diagnosis not present

## 2020-04-30 DIAGNOSIS — I5030 Unspecified diastolic (congestive) heart failure: Secondary | ICD-10-CM | POA: Diagnosis not present

## 2020-04-30 NOTE — Progress Notes (Signed)
04/30/2020 Cheryl [name]   1966/07/03  [id number]  Primary Physician [name], MD Primary Cardiologist: Dr. Johnsie Cancel  Electrophysiologist: None   Reason for Visit/CC: Cheryl [name] / HTN   HPI:  Cheryl [name] is a 54 y.o. female with cardiac risk factors include poorly controlled IDDM, HTN, HLD with statin non compliance former smoker and family history of premature CAD. Cath 2019 with normal coronary arteries    03/29/20 d/c from hospital with DKA and stroke Plavix added. This was not likely embolic located in left basal ganglia. TEE 03/27/20 with normal EF negative bubble and no LAA thrombus.  ILR implanted by EP  Has not had vaccine discussed with her She is concerned about all her allergies. Her daughter Who lives with her has been vaccinated   Cardiac Studies  Procedures LHC 07/2017  RIGHT/LEFT HEART CATH AND CORONARY ANGIOGRAPHY  Conclusion   Normal coronary arteries.  Right dominant coronary anatomy.  Normal right heart hemodynamics and pressures.  Normal left ventricular function with ejection fraction 65%.  Normal hemodynamics.    Left Heart  Left Ventricle The left ventricular size is normal. The left ventricular systolic function is normal. LV end diastolic pressure is normal. The left ventricular ejection fraction is greater than 65% by visual estimate. No regional wall motion abnormalities. There is no evidence of mitral regurgitation.  Coronary Diagrams  Diagnostic Dominance: Right  Intervention    2D Echo 2016  Study Conclusions  - Left ventricle: The cavity size was normal. Systolic function was   normal. The estimated ejection fraction was in the range of 60%   to 65%. Wall motion was normal; there were no regional wall   motion abnormalities. Doppler parameters are consistent with   abnormal left ventricular relaxation (grade 1 diastolic   dysfunction). - Left atrium: The atrium was mildly dilated. - Right ventricle: Systolic function was  normal. - Right atrium: The atrium was normal in size. - Tricuspid valve: Structurally normal valve. There was trivial   regurgitation. - Pulmonic valve: There was trivial regurgitation. - Pulmonary arteries: Systolic pressure was within the normal   range. - Inferior vena cava: The vessel was normal in size. - Pericardium, extracardiac: There was no pericardial effusion.  Impressions:  - Normal biventricular size and systolic function.   Abnormal relaxation, normal filling pressures.   No significant valvular abnormalities.   Current Meds  Medication Sig  . albuterol (VENTOLIN HFA) 108 (90 Base) MCG/ACT inhaler INHALE 2 PUFFS EVERY 6 HOURS AS NEEDED FOR WHEEZING OR SHORTNESS OF BREATH.  [name] atorvastatin (LIPITOR) 80 MG tablet Take 1 tablet (80 mg total) by mouth daily.  . Cholecalciferol (VITAMIN D3) 1000 units CAPS Take 1,000 Units by mouth daily.  . clopidogrel (PLAVIX) 75 MG tablet Take 1 tablet (75 mg total) by mouth daily.  . diazepam (VALIUM) 10 MG tablet Take 10 mg by mouth 2 (two) times daily as needed (panic attacks.).   [name] diltiazem (CARDIZEM CD) 240 MG 24 hr capsule TAKE 1 CAPSULE BY MOUTH EVERY MORNING ON AN EMPTY STOMACH - [name] DENIED, FAXED DR Johnsie Cancel 10/22/15 SS (Patient taking differently: Take 240 mg by mouth daily. )  . FLUoxetine (PROZAC) 40 MG capsule Take 80 mg by mouth at bedtime.   . Fluticasone-Umeclidin-Vilant (TRELEGY ELLIPTA) 100-62.5-25 MCG/INH AEPB Inhale 1 puff into the lungs daily.  [name] HUMULIN R U-500 KWIKPEN 500 UNIT/ML kwikpen Inject 100 Units into the skin daily.   [name] ibuprofen (ADVIL,MOTRIN) 800 MG tablet Take 800 mg  by mouth every 8 (eight) hours as needed (pain).   [name] ipratropium-albuterol (DUONEB) 0.5-2.5 (3) MG/3ML SOLN Take 80m by nebulization every 4-6 hours if needed (Patient taking differently: Inhale 3 mLs into the lungs every 4 (four) hours as needed (wheezing/shortness of breath.). )  . levothyroxine (SYNTHROID, LEVOTHROID) 75 MCG tablet  Take 75 mcg by mouth daily before breakfast.  . liraglutide (VICTOZA) 18 MG/3ML SOPN Inject 1.2 mg into the skin every evening.   .Marland Kitchenlosartan (COZAAR) 25 MG tablet Take 25 mg by mouth daily.  . metoCLOPramide (REGLAN) 10 MG tablet Take 10 mg by mouth 3 (three) times daily.  . nebivolol (BYSTOLIC) 10 MG tablet Take 1 tablet (10 mg total) by mouth daily.  .Marland Kitchennystatin-triamcinolone (MYCOLOG II) cream Apply 1 application topically 2 (two) times daily as needed (yeast in skin folds).   . pantoprazole (PROTONIX) 40 MG tablet Take 1 tablet (40 mg total) by mouth 2 (two) times daily.  . pentosan polysulfate (ELMIRON) 100 MG capsule Take 100 mg by mouth 3 (three) times daily.  . promethazine (PHENERGAN) 25 MG tablet Take 25 mg by mouth 2 (two) times daily as needed for nausea.  . SUMAtriptan (IMITREX) 20 MG/ACT nasal spray Place 20 mg into the nose every 2 (two) hours as needed for migraine or headache. May repeat in 2 hours if headache persists or recurs.  .Marland KitchentiZANidine (ZANAFLEX) 4 MG tablet Take 4 mg by mouth 3 (three) times daily. May hold one dose if needed to operate motor vehicle.   Allergies  Allergen Reactions  . Influenza Vaccines Anaphylaxis and Swelling    Throat swelling, "flu" symptoms  . Iodinated Diagnostic Agents Shortness Of Breath, Nausea And Vomiting, Nausea Only and Rash  . Iodine-131 Shortness Of Breath, Nausea And Vomiting and Rash  . Iohexol Anaphylaxis, Hives and Swelling     Desc: hives,dyspnea; throat swelling; ok w/ premeds and omnipaque   . Levofloxacin Anaphylaxis and Hives  . Nucynta [Tapentadol Hydrochloride] Other (See Comments)    Hallucinations and  insomnia x3 days  . Pregabalin Other (See Comments)    (lyrica)Reaction-Syncope & unable to speak  . Sulfonamide Derivatives Anaphylaxis  . Vantin [Cefpodoxime] Anaphylaxis, Hives, Rash and Cough  . Vilazodone Hcl Other (See Comments)    (Viibryd) Hallucinations  . Amoxicillin Hives and Itching    Did it involve  swelling of the face/tongue/throat, SOB, or low BP? No Did it involve sudden or severe rash/hives, skin peeling, or any reaction on the inside of your mouth or nose? Yes Did you need to seek medical attention at a hospital or doctor's office? No When did it last happen?3-4 years ago If all above answers are "NO", may proceed with cephalosporin use. .  . Biaxin [Clarithromycin] Hives  . Carbamazepine Itching    (Tegretol)  . Ciprofloxacin Hives  . Dilaudid [Hydromorphone Hcl] Itching and Rash    redness  . Iodides Nausea Only  . Percocet [Oxycodone-Acetaminophen] Rash   Past Medical History:  Diagnosis Date  . Allergy   . Anxiety   . Asthma   . Cancer ([place]    melanoma 2015 upper Left arm   . Chronic airway obstruction, not elsewhere classified   . Chronic pain    "q where; herniated disc in my tailbone" (02/09/2013)  . DDD (degenerative disc disease)    CERVIAL AND LUMBAR  . Depression   . Edema   . Fatty liver disease, nonalcoholic   . Fibromyalgia    "severe" (02/09/2013)  .  Gastroparesis    from DM and chronic narcotic use  . GERD (gastroesophageal reflux disease)   . Human parvovirus infection   . Hyperlipidemia   . Hypertension   . Hypothyroidism   . Interstitial cystitis   . Migraine headache    "weekly; worse lately" (02/09/2013)  . OSA (obstructive sleep apnea)    "have mask;; don't use it; no one came out to check it" (02/09/2013)  . Osteoporosis   . Peripheral neuropathy    "severe" (02/09/2013)  . 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 PONV (postoperative nausea and vomiting)   . Positive PPD   . Shortness of breath    "at anytime; it's gotten worse recently" (02/09/2013)  . Sleep apnea   . Type II diabetes mellitus ([place])   . Vitamin D deficiency   . Vocal cord dysfunction    Family History  Problem Relation Age of Onset  . Coronary artery disease Father   . Diabetes Brother   . Diabetes Brother   .  Breast cancer Maternal Aunt   . Breast cancer Cousin   . Breast cancer Cousin   . Colon cancer Neg Hx   . Esophageal cancer Neg Hx   . Liver cancer Neg Hx   . Pancreatic cancer Neg Hx   . Rectal cancer Neg Hx   . Stomach cancer Neg Hx    Past Surgical History:  Procedure Laterality Date  . ABDOMINAL HYSTERECTOMY  1993  . ANTERIOR CERVICAL DECOMP/DISCECTOMY FUSION  2004  . arm surgery     left 2018  . BRONCHOSCOPY  06/28/2019  . BUBBLE STUDY  03/27/2020   Procedure: BUBBLE STUDY;  Surgeon: Josue Hector, MD;  Location: [facility] ENDOSCOPY;  Service: Cardiovascular;;  . CARPAL TUNNEL RELEASE Bilateral ?1980's  . CHOLECYSTECTOMY  ?1990  . COLONOSCOPY    . HIP SURGERY Right 2002   "did something to the tibula band" (02/09/2013)  . KNEE ARTHROSCOPY Left 1990's   "fell; clot behind knee cap had to be removed" (02/09/2013)  . LOOP RECORDER INSERTION N/A 03/27/2020   Procedure: LOOP RECORDER INSERTION;  Surgeon: Thompson Grayer, MD;  Location: [place] CV LAB;  Service: Cardiovascular;  Laterality: N/A;  . NASAL SINUS SURGERY  1986; 1990's   "i've had 3" (02/09/2013)  . RIGHT/LEFT HEART CATH AND CORONARY ANGIOGRAPHY N/A 08/26/2017   Procedure: RIGHT/LEFT HEART CATH AND CORONARY ANGIOGRAPHY;  Surgeon: Belva Crome, MD;  Location: [place] CV LAB;  Service: Cardiovascular;  Laterality: N/A;  . SHOULDER ARTHROSCOPY W/ ROTATOR CUFF REPAIR Right    "had to go deep in rotator cuff" (02/09/2013)  . TEE WITHOUT CARDIOVERSION N/A 03/27/2020   Procedure: TRANSESOPHAGEAL ECHOCARDIOGRAM (TEE);  Surgeon: Josue Hector, MD;  Location: [facility] ENDOSCOPY;  Service: Cardiovascular;  Laterality: N/A;  . VIDEO BRONCHOSCOPY Bilateral 06/28/2019   Procedure: VIDEO BRONCHOSCOPY WITH FLUORO;  Surgeon: Marshell Garfinkel, MD;  Location: [place] ENDOSCOPY;  Service: Cardiopulmonary;  Laterality: Bilateral;   Social History   Socioeconomic History  . Marital status: Divorced    Spouse name: Not on file  . Number of children:  Not on file  . Years of education: Not on file  . Highest education level: Not on file  Occupational History  . Not on file  Tobacco Use  . Smoking status: Former Smoker    Packs/day: 0.10    Years: 3.00    Pack years: 0.30    Types: Cigarettes  Quit date: 2010    Years since quitting: 11.7  . Smokeless tobacco: Never Used  . Tobacco comment: only smokes in high school then every now and then  Vaping Use  . Vaping Use: Never used  Substance and Sexual Activity  . Alcohol use: No  . Drug use: No  . Sexual activity: Never  Other Topics Concern  . Not on file  Social History Narrative   Lives with daughter in a one story home.  On disability.  Education: college.     Social Determinants of Health   Financial Resource Strain:   . Difficulty of Paying Living Expenses: Not on file  Food Insecurity: No Food Insecurity  . Worried About Charity fundraiser in the Last Year: Never true  . Ran Out of Food in the Last Year: Never true  Transportation Needs: No Transportation Needs  . Lack of Transportation (Medical): No  . Lack of Transportation (Non-Medical): No  Physical Activity:   . Days of Exercise per Week: Not on file  . Minutes of Exercise per Session: Not on file  Stress:   . Feeling of Stress : Not on file  Social Connections:   . Frequency of Communication with Friends and Family: Not on file  . Frequency of Social Gatherings with Friends and Family: Not on file  . Attends Religious Services: Not on file  . Active Member of Clubs or Organizations: Not on file  . Attends Archivist Meetings: Not on file  . Marital Status: Not on file  Intimate Partner Violence:   . Fear of Current or Ex-Partner: Not on file  . Emotionally Abused: Not on file  . Physically Abused: Not on file  . Sexually Abused: Not on file     Lipid Panel     Component Value Date/Time   CHOL 217 (H) 03/24/2020 0428   TRIG 136 03/24/2020 0428   HDL 82 03/24/2020 0428   CHOLHDL 2.6  03/24/2020 0428   VLDL 27 03/24/2020 0428   LDLCALC 108 (H) 03/24/2020 0428    Review of Systems: General: negative for chills, fever, night sweats or weight changes.  Cardiovascular: negative for chest pain, dyspnea on exertion, edema, orthopnea, palpitations, paroxysmal nocturnal dyspnea or shortness of breath Dermatological: negative for rash Respiratory: negative for cough or wheezing Urologic: negative for hematuria Abdominal: negative for nausea, vomiting, diarrhea, bright red blood per rectum, melena, or hematemesis Neurologic: negative for visual changes, syncope, or dizziness All other systems reviewed and are otherwise negative except as noted above.  Physical Exam:  Affect appropriate Healthy:  appears stated age 45: normal Neck supple with no adenopathy JVP normal no bruits no thyromegaly Lungs clear with no wheezing and good diaphragmatic motion Heart:  S1/S2 no murmur, no rub, gallop or click PMI normal Abdomen: benighn, BS positve, no tenderness, no AAA no bruit.  No HSM or HJR Distal pulses intact with no bruits No edema Neuro non-focal Skin warm and dry No muscular weakness   EKG NSR 73 bpm  -- personally reviewed   ASSESSMENT AND PLAN:   1. CVA:  Not likely embolic f/u Dr Leonie Man continue Plavix ILR with no PAF noted with nightly transmission TEE with no SOE and negative bubble 2. DM:  Discussed low carb diet.  Target hemoglobin A1c is 6.5 or less.  Continue current medications. 3. HTN:  Well controlled.  Continue current medications and low sodium Dash type diet.   4. HLD  On lipitor f/u labs with  primary    Follow-Up: PRN with me/general cardiology Can see EP yearly regarding ILR  Jenkins Rouge MD [facility]

## 2020-04-30 NOTE — Patient Instructions (Addendum)
Medication Instructions:  *If you need a refill on your cardiac medications before your next appointment, please call your pharmacy*  Lab Work: If you have labs (blood work) drawn today and your tests are completely normal, you will receive your results only by: [name] MyChart Message (if you have MyChart) OR . A paper copy in the mail If you have any lab test that is abnormal or we need to change your treatment, we will call you to review the results.  Testing/Procedures: None ordered today.   Follow-Up: At [facility], you and your health needs are our priority.  As part of our continuing mission to provide you with exceptional heart care, we have created designated Provider Care Teams.  These Care Teams include your primary Cardiologist (physician) and Advanced Practice Providers (APPs -  Physician Assistants and Nurse Practitioners) who all work together to provide you with the care you need, when you need it.  We recommend signing up for the patient portal called "MyChart".  Sign up information is provided on this After Visit Summary.  MyChart is used to connect with patients for Virtual Visits (Telemedicine).  Patients are able to view lab/test results, encounter notes, upcoming appointments, etc.  Non-urgent messages can be sent to your provider as well.   To learn more about what you can do with MyChart, go to [email or web address].      Your physician recommends that you schedule a follow-up appointment with EP, Dr. Rayann Heman for your loop recorder.

## 2020-05-01 ENCOUNTER — Ambulatory Visit (INDEPENDENT_AMBULATORY_CARE_PROVIDER_SITE_OTHER): Payer: Medicare HMO

## 2020-05-01 DIAGNOSIS — I639 Cerebral infarction, unspecified: Secondary | ICD-10-CM | POA: Diagnosis not present

## 2020-05-01 LAB — CUP PACEART REMOTE DEVICE CHECK
Date Time Interrogation Session: 20211004141720
Implantable Pulse Generator Implant Date: 20210831

## 2020-05-03 DIAGNOSIS — Z794 Long term (current) use of insulin: Secondary | ICD-10-CM | POA: Diagnosis not present

## 2020-05-03 DIAGNOSIS — E039 Hypothyroidism, unspecified: Secondary | ICD-10-CM | POA: Diagnosis not present

## 2020-05-03 DIAGNOSIS — E1142 Type 2 diabetes mellitus with diabetic polyneuropathy: Secondary | ICD-10-CM | POA: Diagnosis not present

## 2020-05-03 DIAGNOSIS — E1165 Type 2 diabetes mellitus with hyperglycemia: Secondary | ICD-10-CM | POA: Diagnosis not present

## 2020-05-03 DIAGNOSIS — E782 Mixed hyperlipidemia: Secondary | ICD-10-CM | POA: Diagnosis not present

## 2020-05-03 NOTE — Progress Notes (Signed)
Carelink Summary Report / Loop Recorder 

## 2020-05-04 DIAGNOSIS — G9341 Metabolic encephalopathy: Secondary | ICD-10-CM | POA: Diagnosis not present

## 2020-05-04 DIAGNOSIS — E1165 Type 2 diabetes mellitus with hyperglycemia: Secondary | ICD-10-CM | POA: Diagnosis not present

## 2020-05-04 DIAGNOSIS — I69398 Other sequelae of cerebral infarction: Secondary | ICD-10-CM | POA: Diagnosis not present

## 2020-05-04 DIAGNOSIS — I5030 Unspecified diastolic (congestive) heart failure: Secondary | ICD-10-CM | POA: Diagnosis not present

## 2020-05-04 DIAGNOSIS — E1169 Type 2 diabetes mellitus with other specified complication: Secondary | ICD-10-CM | POA: Diagnosis not present

## 2020-05-04 DIAGNOSIS — I110 Hypertensive heart disease with heart failure: Secondary | ICD-10-CM | POA: Diagnosis not present

## 2020-05-04 DIAGNOSIS — J449 Chronic obstructive pulmonary disease, unspecified: Secondary | ICD-10-CM | POA: Diagnosis not present

## 2020-05-04 DIAGNOSIS — K760 Fatty (change of) liver, not elsewhere classified: Secondary | ICD-10-CM | POA: Diagnosis not present

## 2020-05-04 DIAGNOSIS — E039 Hypothyroidism, unspecified: Secondary | ICD-10-CM | POA: Diagnosis not present

## 2020-05-07 ENCOUNTER — Other Ambulatory Visit: Payer: Self-pay

## 2020-05-07 NOTE — Patient Outreach (Signed)
[facility]) Care Management  [place]  05/07/2020   Cheryl [name] 29-Mar-1966 [id number]  Subjective: Telephone call to patient for follow up. Patient reports she is doing ok. She states that she gets frustrated with slowness of speech. Discussed with patient stroke and rehabilitation and to remain positive. She verbalized understanding. Patient sugars are better with increase in her insulin.  Most recent reading 132.  Discussed importance of diabetes control. She verbalized understanding and voices no concerns.    Objective:   Encounter Medications:  Outpatient Encounter Medications as of 05/07/2020  Medication Sig Note  . albuterol (VENTOLIN HFA) 108 (90 Base) MCG/ACT inhaler INHALE 2 PUFFS EVERY 6 HOURS AS NEEDED FOR WHEEZING OR SHORTNESS OF BREATH.   [name] atorvastatin (LIPITOR) 80 MG tablet Take 1 tablet (80 mg total) by mouth daily.   . Cholecalciferol (VITAMIN D3) 1000 units CAPS Take 1,000 Units by mouth daily.   . clopidogrel (PLAVIX) 75 MG tablet Take 1 tablet (75 mg total) by mouth daily.   . diazepam (VALIUM) 10 MG tablet Take 10 mg by mouth 2 (two) times daily as needed (panic attacks.).    [name] diltiazem (CARDIZEM CD) 240 MG 24 hr capsule TAKE 1 CAPSULE BY MOUTH EVERY MORNING ON AN EMPTY STOMACH - [name] DENIED, FAXED DR Johnsie Cancel 10/22/15 SS (Patient taking differently: Take 240 mg by mouth daily. )   . FLUoxetine (PROZAC) 40 MG capsule Take 80 mg by mouth at bedtime.    . Fluticasone-Umeclidin-Vilant (TRELEGY ELLIPTA) 100-62.5-25 MCG/INH AEPB Inhale 1 puff into the lungs daily.   [name] HUMULIN R U-500 KWIKPEN 500 UNIT/ML kwikpen Inject 100 Units into the skin daily.  04/24/2020: Taking 130 units am and 30 units in the PM  . ibuprofen (ADVIL,MOTRIN) 800 MG tablet Take 800 mg by mouth every 8 (eight) hours as needed (pain).    [name] ipratropium-albuterol (DUONEB) 0.5-2.5 (3) MG/3ML SOLN Take 55m by nebulization every 4-6 hours if needed (Patient taking  differently: Inhale 3 mLs into the lungs every 4 (four) hours as needed (wheezing/shortness of breath.). )   . levothyroxine (SYNTHROID, LEVOTHROID) 75 MCG tablet Take 75 mcg by mouth daily before breakfast.   . liraglutide (VICTOZA) 18 MG/3ML SOPN Inject 1.2 mg into the skin every evening.    .Marland Kitchenlosartan (COZAAR) 25 MG tablet Take 25 mg by mouth daily.   . metoCLOPramide (REGLAN) 10 MG tablet Take 10 mg by mouth 3 (three) times daily.   . nebivolol (BYSTOLIC) 10 MG tablet Take 1 tablet (10 mg total) by mouth daily.   .Marland Kitchennystatin-triamcinolone (MYCOLOG II) cream Apply 1 application topically 2 (two) times daily as needed (yeast in skin folds).    . pantoprazole (PROTONIX) 40 MG tablet Take 1 tablet (40 mg total) by mouth 2 (two) times daily.   . pentosan polysulfate (ELMIRON) 100 MG capsule Take 100 mg by mouth 3 (three) times daily.   . promethazine (PHENERGAN) 25 MG tablet Take 25 mg by mouth 2 (two) times daily as needed for nausea.   . SUMAtriptan (IMITREX) 20 MG/ACT nasal spray Place 20 mg into the nose every 2 (two) hours as needed for migraine or headache. May repeat in 2 hours if headache persists or recurs.   .Marland KitchentiZANidine (ZANAFLEX) 4 MG tablet Take 4 mg by mouth 3 (three) times daily. May hold one dose if needed to operate motor vehicle.    No facility-administered encounter medications on file as of 05/07/2020.    Functional Status:  In your present state of health, do you have any difficulty performing the following activities: 03/30/2020 09/27/2019  Hearing? N N  Vision? N N  Difficulty concentrating or making decisions? Y N  Comment recent stroke -  Walking or climbing stairs? Y N  Comment recent stroke -  Dressing or bathing? N N  Doing errands, shopping? Aggie Moats  Comment daughter assists -  Conservation officer, nature and eating ? Y N  Comment family helps -  Using the Toilet? N N  In the past six months, have you accidently leaked urine? N N  Do you have problems with loss of bowel control?  N N  Managing your Medications? Aggie Moats  Comment daughter [place] your Finances? Aggie Moats  Comment daughter [name] helps -  Housekeeping or managing your Housekeeping? Y N  Comment daughter [name] Helps -  Some recent data might be hidden    Fall/Depression Screening: Fall Risk  03/30/2020 09/27/2019 07/31/2017  Falls in the past year? 1 0 No  Comment - - -  Number falls in past yr: 1 - -  Injury with Fall? 0 - -  Risk Factor Category  - - -  Risk for fall due to : History of fall(s) - -  Follow up Falls prevention discussed - -   PHQ 2/9 Scores 03/30/2020 09/27/2019 07/31/2017 07/03/2017 05/18/2017 03/24/2017 01/26/2017  PHQ - 2 Score 1 1 1 1 1 1  0  PHQ- 9 Score - - - - - - -    Assessment: Patient managing post stroke with assistance of family.    Goals Addressed              This Visit's Progress   .  COMPLETED: Follow up with Primary Care Provider (pt-stated)   On track     Has appointment with Dr. [name] 05/03/20.    .  Monitor and Manage My Blood Sugar   On track     Follow Up Date 05/24/20   - check blood sugar at prescribed times - check blood sugar if I feel it is too high or too low - take the blood sugar meter to all doctor visits    Why is this important?   Checking your blood sugar at home helps to keep it from getting very high or very low.  Writing the results in a diary or log helps the doctor know how to care for you.  Your blood sugar log should have the time, date and the results.  Also, write down the amount of insulin or other medicine that you take.  Other information, like what you ate, exercise done and how you were feeling, will also be helpful.     Notes: Patient blood sugars lower with new insulin dosing.  Last check 132.       Plan: RN CM will contact patient again in the month of October and patient agrees to next outreach.

## 2020-05-08 ENCOUNTER — Ambulatory Visit: Payer: Medicare HMO | Admitting: Internal Medicine

## 2020-05-11 DIAGNOSIS — E119 Type 2 diabetes mellitus without complications: Secondary | ICD-10-CM | POA: Diagnosis not present

## 2020-05-11 DIAGNOSIS — E1165 Type 2 diabetes mellitus with hyperglycemia: Secondary | ICD-10-CM | POA: Diagnosis not present

## 2020-05-11 DIAGNOSIS — H5213 Myopia, bilateral: Secondary | ICD-10-CM | POA: Diagnosis not present

## 2020-05-11 DIAGNOSIS — E1169 Type 2 diabetes mellitus with other specified complication: Secondary | ICD-10-CM | POA: Diagnosis not present

## 2020-05-11 DIAGNOSIS — K760 Fatty (change of) liver, not elsewhere classified: Secondary | ICD-10-CM | POA: Diagnosis not present

## 2020-05-11 DIAGNOSIS — I69398 Other sequelae of cerebral infarction: Secondary | ICD-10-CM | POA: Diagnosis not present

## 2020-05-11 DIAGNOSIS — I5030 Unspecified diastolic (congestive) heart failure: Secondary | ICD-10-CM | POA: Diagnosis not present

## 2020-05-11 DIAGNOSIS — G9341 Metabolic encephalopathy: Secondary | ICD-10-CM | POA: Diagnosis not present

## 2020-05-11 DIAGNOSIS — I110 Hypertensive heart disease with heart failure: Secondary | ICD-10-CM | POA: Diagnosis not present

## 2020-05-11 DIAGNOSIS — J449 Chronic obstructive pulmonary disease, unspecified: Secondary | ICD-10-CM | POA: Diagnosis not present

## 2020-05-11 DIAGNOSIS — E039 Hypothyroidism, unspecified: Secondary | ICD-10-CM | POA: Diagnosis not present

## 2020-05-15 DIAGNOSIS — E1142 Type 2 diabetes mellitus with diabetic polyneuropathy: Secondary | ICD-10-CM | POA: Diagnosis not present

## 2020-05-15 DIAGNOSIS — G894 Chronic pain syndrome: Secondary | ICD-10-CM | POA: Diagnosis not present

## 2020-05-15 DIAGNOSIS — E782 Mixed hyperlipidemia: Secondary | ICD-10-CM | POA: Diagnosis not present

## 2020-05-15 DIAGNOSIS — K3184 Gastroparesis: Secondary | ICD-10-CM | POA: Diagnosis not present

## 2020-05-15 DIAGNOSIS — F418 Other specified anxiety disorders: Secondary | ICD-10-CM | POA: Diagnosis not present

## 2020-05-15 DIAGNOSIS — E039 Hypothyroidism, unspecified: Secondary | ICD-10-CM | POA: Diagnosis not present

## 2020-05-15 DIAGNOSIS — Z23 Encounter for immunization: Secondary | ICD-10-CM | POA: Diagnosis not present

## 2020-05-15 DIAGNOSIS — E669 Obesity, unspecified: Secondary | ICD-10-CM | POA: Diagnosis not present

## 2020-05-15 DIAGNOSIS — K7581 Nonalcoholic steatohepatitis (NASH): Secondary | ICD-10-CM | POA: Diagnosis not present

## 2020-05-16 DIAGNOSIS — K760 Fatty (change of) liver, not elsewhere classified: Secondary | ICD-10-CM | POA: Diagnosis not present

## 2020-05-16 DIAGNOSIS — E1165 Type 2 diabetes mellitus with hyperglycemia: Secondary | ICD-10-CM | POA: Diagnosis not present

## 2020-05-16 DIAGNOSIS — I69398 Other sequelae of cerebral infarction: Secondary | ICD-10-CM | POA: Diagnosis not present

## 2020-05-16 DIAGNOSIS — J449 Chronic obstructive pulmonary disease, unspecified: Secondary | ICD-10-CM | POA: Diagnosis not present

## 2020-05-16 DIAGNOSIS — G9341 Metabolic encephalopathy: Secondary | ICD-10-CM | POA: Diagnosis not present

## 2020-05-16 DIAGNOSIS — E039 Hypothyroidism, unspecified: Secondary | ICD-10-CM | POA: Diagnosis not present

## 2020-05-16 DIAGNOSIS — I5030 Unspecified diastolic (congestive) heart failure: Secondary | ICD-10-CM | POA: Diagnosis not present

## 2020-05-16 DIAGNOSIS — E1169 Type 2 diabetes mellitus with other specified complication: Secondary | ICD-10-CM | POA: Diagnosis not present

## 2020-05-16 DIAGNOSIS — I110 Hypertensive heart disease with heart failure: Secondary | ICD-10-CM | POA: Diagnosis not present

## 2020-05-21 ENCOUNTER — Other Ambulatory Visit: Payer: Self-pay

## 2020-05-21 DIAGNOSIS — E1165 Type 2 diabetes mellitus with hyperglycemia: Secondary | ICD-10-CM | POA: Diagnosis not present

## 2020-05-21 NOTE — Patient Outreach (Signed)
[facility]) Care Management  05/21/2020  Cheryl [name] June 08, 1966 [id number]   Telephone call to patient for disease management follow up.   No answer.  HIPAA compliant voice message left.    Plan: If no return call, RN CM will attempt patient again with 4 business days.    [name], RN, MSN [place] Management Care Management Coordinator Direct Line [phone] Cell [phone] Toll Free: [phone]  Fax: [phone]

## 2020-05-22 ENCOUNTER — Other Ambulatory Visit: Payer: Self-pay

## 2020-05-22 NOTE — Patient Outreach (Signed)
[facility]) Care Management  [place]  05/22/2020   Cheryl [name] [birth year] [id number]  Subjective: Telephone call to patient for follow up. Patient reports doing good. Patient noted to be faster with her responses.  She states that overall she is doing better as well.  She is bathing on her own just making sure her daughter is there.  Daughter has gone back to work now and her mother checks in on her during the day.  Patient reports sugars are good and that last check was 170. She sis have a low in which she had a half of peanut butter sandwich.  Discussed diabetes and hypoglycemia management. She verbalized understanding and voices no concerns.    Objective:   Encounter Medications:  Outpatient Encounter Medications as of 05/22/2020  Medication Sig Note  . albuterol (VENTOLIN HFA) 108 (90 Base) MCG/ACT inhaler INHALE 2 PUFFS EVERY 6 HOURS AS NEEDED FOR WHEEZING OR SHORTNESS OF BREATH.   [name] atorvastatin (LIPITOR) 80 MG tablet Take 1 tablet (80 mg total) by mouth daily.   . Cholecalciferol (VITAMIN D3) 1000 units CAPS Take 1,000 Units by mouth daily.   . clopidogrel (PLAVIX) 75 MG tablet Take 1 tablet (75 mg total) by mouth daily.   . diazepam (VALIUM) 10 MG tablet Take 10 mg by mouth 2 (two) times daily as needed (panic attacks.).    [name] diltiazem (CARDIZEM CD) 240 MG 24 hr capsule TAKE 1 CAPSULE BY MOUTH EVERY MORNING ON AN EMPTY STOMACH - [name] DENIED, FAXED DR Johnsie Cancel 10/22/15 SS (Patient taking differently: Take 240 mg by mouth daily. )   . FLUoxetine (PROZAC) 40 MG capsule Take 80 mg by mouth at bedtime.    . Fluticasone-Umeclidin-Vilant (TRELEGY ELLIPTA) 100-62.5-25 MCG/INH AEPB Inhale 1 puff into the lungs daily.   [name] HUMULIN R U-500 KWIKPEN 500 UNIT/ML kwikpen Inject 100 Units into the skin daily.  04/24/2020: Taking 130 units am and 30 units in the PM  . ibuprofen (ADVIL,MOTRIN) 800 MG tablet Take 800 mg by mouth every 8 (eight) hours as needed  (pain).    [name] ipratropium-albuterol (DUONEB) 0.5-2.5 (3) MG/3ML SOLN Take 41m by nebulization every 4-6 hours if needed (Patient taking differently: Inhale 3 mLs into the lungs every 4 (four) hours as needed (wheezing/shortness of breath.). )   . levothyroxine (SYNTHROID, LEVOTHROID) 75 MCG tablet Take 75 mcg by mouth daily before breakfast.   . liraglutide (VICTOZA) 18 MG/3ML SOPN Inject 1.2 mg into the skin every evening.    .Marland Kitchenlosartan (COZAAR) 25 MG tablet Take 25 mg by mouth daily.   . metoCLOPramide (REGLAN) 10 MG tablet Take 10 mg by mouth 3 (three) times daily.   . nebivolol (BYSTOLIC) 10 MG tablet Take 1 tablet (10 mg total) by mouth daily.   .Marland Kitchennystatin-triamcinolone (MYCOLOG II) cream Apply 1 application topically 2 (two) times daily as needed (yeast in skin folds).    . pantoprazole (PROTONIX) 40 MG tablet Take 1 tablet (40 mg total) by mouth 2 (two) times daily.   . pentosan polysulfate (ELMIRON) 100 MG capsule Take 100 mg by mouth 3 (three) times daily.   . promethazine (PHENERGAN) 25 MG tablet Take 25 mg by mouth 2 (two) times daily as needed for nausea.   . SUMAtriptan (IMITREX) 20 MG/ACT nasal spray Place 20 mg into the nose every 2 (two) hours as needed for migraine or headache. May repeat in 2 hours if headache persists or recurs.   .Marland KitchentiZANidine (ZANAFLEX)  4 MG tablet Take 4 mg by mouth 3 (three) times daily. May hold one dose if needed to operate motor vehicle.    No facility-administered encounter medications on file as of 05/22/2020.    Functional Status:  In your present state of health, do you have any difficulty performing the following activities: 03/30/2020 09/27/2019  Hearing? N N  Vision? N N  Difficulty concentrating or making decisions? Y N  Comment recent stroke -  Walking or climbing stairs? Y N  Comment recent stroke -  Dressing or bathing? N N  Doing errands, shopping? Aggie Moats  Comment daughter assists -  Conservation officer, nature and eating ? Y N  Comment family helps -   Using the Toilet? N N  In the past six months, have you accidently leaked urine? N N  Do you have problems with loss of bowel control? N N  Managing your Medications? Aggie Moats  Comment daughter [place] your Finances? Aggie Moats  Comment daughter [name] helps -  Housekeeping or managing your Housekeeping? Y N  Comment daughter [name] Helps -  Some recent data might be hidden    Fall/Depression Screening: Fall Risk  03/30/2020 09/27/2019 07/31/2017  Falls in the past year? 1 0 No  Comment - - -  Number falls in past yr: 1 - -  Injury with Fall? 0 - -  Risk Factor Category  - - -  Risk for fall due to : History of fall(s) - -  Follow up Falls prevention discussed - -   PHQ 2/9 Scores 03/30/2020 09/27/2019 07/31/2017 07/03/2017 05/18/2017 03/24/2017 01/26/2017  PHQ - 2 Score 1 1 1 1 1 1  0  PHQ- 9 Score - - - - - - -    Assessment: Patient continues post stroke recovery and doing better with diabetes management.  Patient continues to benefit from care management follow up.   Goals Addressed            This Visit's Progress   . Monitor and Manage My Blood Sugar   On track    Follow Up Date 06/26/20   - check blood sugar at prescribed times - check blood sugar if I feel it is too high or too low - take the blood sugar meter to all doctor visits    Why is this important?   Checking your blood sugar at home helps to keep it from getting very high or very low.  Writing the results in a diary or log helps the doctor know how to care for you.  Your blood sugar log should have the time, date and the results.  Also, write down the amount of insulin or other medicine that you take.  Other information, like what you ate, exercise done and how you were feeling, will also be helpful.     Notes: Patient blood sugars lower with new insulin dosing.  Last check 132. 05/22/20 continue monitoring sugars and limiting carbohydrates.      . THN-Medication Adherence Maintained       Evidence-based  guidance:  Develop a complete and accurate medication list including those prescribed and over-the-counter, those taken only occasionally and those not taken by mouth such as injections, inhalers, ointments or creams and drops.  Review all medications to determine if patient or caregiver knows why the medications are given and if taken as prescribed.  Complete or review a medication adherence assessment including barriers to medication adherence.  Arrange and encourage counseling and  medication review by pharmacist.  Assess barriers to medication adherence.  Manage poor understanding or health literacy by using easy to understand language, teach-back, visual aids and teaching only 2 or 3 points at a time.  Assess presence of side effects; provide suggestions to manage or reduce side effects.  Consult with provider and/or pharmacist regarding substitute medication, changing dose, simplification of regimen or safe discontinuation of some medications.  Encourage the use of medication reminders such as clock or cell phone alarm, color coding, pillboxes for am/pm and days of the week, pharmacy refill reminder, auto-refill system or mail-order option.  Assist with resources when cost is a barrier; refer to prescription assistance programs; confirm that generics are prescribed whenever possible; consider 90-day prescriptions to reduce copay cost; synchronize refills.  Provide help to complete medication assistance applications or health insurance forms as needed.  Complete a follow-up call 2 to 3 weeks after medication self-management plan developed; assess adherence and understanding, as well as listen to patient or caregiver concerns; amend plan as needed.  Provide frequent follow-up providing motivation, encouragement and support when medication nonadherence is identified.   Notes:        Plan: RN CM will contact patient next month and patient agreeable.    [name], RN, MSN [place]  Management Care Management Coordinator Direct Line [phone] Cell [phone] Toll Free: [phone]  Fax: [phone]

## 2020-05-24 DIAGNOSIS — E039 Hypothyroidism, unspecified: Secondary | ICD-10-CM | POA: Diagnosis not present

## 2020-05-24 DIAGNOSIS — E1165 Type 2 diabetes mellitus with hyperglycemia: Secondary | ICD-10-CM | POA: Diagnosis not present

## 2020-05-24 DIAGNOSIS — K760 Fatty (change of) liver, not elsewhere classified: Secondary | ICD-10-CM | POA: Diagnosis not present

## 2020-05-24 DIAGNOSIS — I110 Hypertensive heart disease with heart failure: Secondary | ICD-10-CM | POA: Diagnosis not present

## 2020-05-24 DIAGNOSIS — E1169 Type 2 diabetes mellitus with other specified complication: Secondary | ICD-10-CM | POA: Diagnosis not present

## 2020-05-24 DIAGNOSIS — G9341 Metabolic encephalopathy: Secondary | ICD-10-CM | POA: Diagnosis not present

## 2020-05-24 DIAGNOSIS — I69398 Other sequelae of cerebral infarction: Secondary | ICD-10-CM | POA: Diagnosis not present

## 2020-05-24 DIAGNOSIS — J449 Chronic obstructive pulmonary disease, unspecified: Secondary | ICD-10-CM | POA: Diagnosis not present

## 2020-05-24 DIAGNOSIS — I5030 Unspecified diastolic (congestive) heart failure: Secondary | ICD-10-CM | POA: Diagnosis not present

## 2020-05-25 ENCOUNTER — Ambulatory Visit: Payer: Self-pay

## 2020-05-28 ENCOUNTER — Ambulatory Visit: Payer: Medicare HMO | Admitting: Neurology

## 2020-05-28 ENCOUNTER — Encounter: Payer: Self-pay | Admitting: Neurology

## 2020-05-28 VITALS — BP 125/68 | HR 84 | Ht 62.0 in | Wt 199.6 lb

## 2020-05-28 DIAGNOSIS — I639 Cerebral infarction, unspecified: Secondary | ICD-10-CM

## 2020-05-28 DIAGNOSIS — G4733 Obstructive sleep apnea (adult) (pediatric): Secondary | ICD-10-CM | POA: Diagnosis not present

## 2020-05-28 NOTE — Progress Notes (Signed)
[facility] [address] [place]. [place] [address] [phone]       OFFICE FOLLOW-UP NOTE  Cheryl [name] Date of Birth:  1965/07/29 Medical Record Number:  [id number]   HPI: Cheryl [name] is a 54 year old Caucasian lady seen today for initial office follow-up visit following hospital consultation for stroke in August 2021.  She is accompanied by her mother.  History is obtained from them, review of electronic medical records and I personally reviewed available imaging films in PACS. She has past medical history for hypertension, hyperlipidemia, diabetes, migraine, sleep apnea, asthma, hypothyroidism, gastroesophageal reflux disease, depression, vitamin D deficiency was brought to [facility] on 03/24/2020 with history of confusion following a fall at home 10 days prior to admission.  She also had some speech difficulty.  CT scan of the head showed a low-density in the left basal ganglia and MRI showed acute to subacute infarct involving left basal ganglia and adjacent white matter.  There was a chronic small right thalamic infarct of remote age also noted.  Carotid ultrasound showed no significant extracranial stenosis.  Transthoracic echo showed normal ejection fraction.  LDL cholesterol elevated 108 mg percent and hemoglobin A1c at 7.6.  Patient showed interest and did qualify for participation in the sleep smart study and was tested positive for sleep apnea and was randomized to the CPAP treatment.  She was discharged on aspirin Plavix for 3 weeks and is currently on Plavix alone which is tolerating well without bruising or bleeding.  She has had no recurrent stroke or TIA symptoms.  She states overall she is doing better her speech is improving but she feels that she fatigues easily and short-term memory is also not good.  She however admits she has not been regular with using his CPAP every night as instructed.  Blood pressure is well controlled today at 125/68.   Sugars were elevated and her endocrinologist Dr. [name] has increase nighttime insulin from 40 to 50 units recently.  She remains on Lipitor which is tolerating well without muscle aches and pains.  She continues to complain of mild short-term memory difficulties following stroke but these are not getting worse ROS:   14 system review of systems is positive for fatigue, weight loss, palpitations, trouble swallowing, itching, rash, blurred and double vision, shortness of breath, wheezing, snoring, easy bleeding, increased thirst, joint pain and swelling, muscle cramps and aching muscles, allergies, runny nose, confusion, memory loss, numbness, weakness, dizziness, anxiety, depression, not enough sleep, sleepiness.  All other systems negative  PMH:  Past Medical History:  Diagnosis Date  . Allergy   . Anxiety   . Asthma   . Cancer ([place])    melanoma 2015 upper Left arm   . Chronic airway obstruction, not elsewhere classified   . Chronic pain    "q where; herniated disc in my tailbone" (02/09/2013)  . DDD (degenerative disc disease)    CERVIAL AND LUMBAR  . Depression   . Edema   . Fatty liver disease, nonalcoholic   . Fibromyalgia    "severe" (02/09/2013)  . Gastroparesis    from DM and chronic narcotic use  . GERD (gastroesophageal reflux disease)   . Human parvovirus infection   . Hyperlipidemia   . Hypertension   . Hypothyroidism   . Interstitial cystitis   . Migraine headache    "weekly; worse lately" (02/09/2013)  . OSA (obstructive sleep apnea)    "have mask;; don't use it; no one came out to check  it" (02/09/2013)  . Osteoporosis   . Peripheral neuropathy    "severe" (02/09/2013)  . 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 PONV (postoperative nausea and vomiting)   . Positive PPD   . Shortness of breath    "at anytime; it's gotten worse recently" (02/09/2013)  . Sleep apnea   . Type II diabetes mellitus ([place])   . Vitamin D  deficiency   . Vocal cord dysfunction     Social History:  Social History   Socioeconomic History  . Marital status: Divorced    Spouse name: Not on file  . Number of children: Not on file  . Years of education: Not on file  . Highest education level: Not on file  Occupational History  . Occupation: disabled  Tobacco Use  . Smoking status: Former Smoker    Packs/day: 0.10    Years: 3.00    Pack years: 0.30    Types: Cigarettes    Quit date: 2010    Years since quitting: 11.8  . Smokeless tobacco: Never Used  . Tobacco comment: only smokes in high school then every now and then  Vaping Use  . Vaping Use: Never used  Substance and Sexual Activity  . Alcohol use: No  . Drug use: No  . Sexual activity: Never  Other Topics Concern  . Not on file  Social History Narrative   Lives with daughter in a one story house   Right Handed   Drinks 2 cups caffeine daily   Social Determinants of Health   Financial Resource Strain:   . Difficulty of Paying Living Expenses: Not on file  Food Insecurity: No Food Insecurity  . Worried About Charity fundraiser in the Last Year: Never true  . Ran Out of Food in the Last Year: Never true  Transportation Needs: No Transportation Needs  . Lack of Transportation (Medical): No  . Lack of Transportation (Non-Medical): No  Physical Activity:   . Days of Exercise per Week: Not on file  . Minutes of Exercise per Session: Not on file  Stress:   . Feeling of Stress : Not on file  Social Connections:   . Frequency of Communication with Friends and Family: Not on file  . Frequency of Social Gatherings with Friends and Family: Not on file  . Attends Religious Services: Not on file  . Active Member of Clubs or Organizations: Not on file  . Attends Archivist Meetings: Not on file  . Marital Status: Not on file  Intimate Partner Violence:   . Fear of Current or Ex-Partner: Not on file  . Emotionally Abused: Not on file  . Physically  Abused: Not on file  . Sexually Abused: Not on file    Medications:   Current Outpatient Medications on File Prior to Visit  Medication Sig Dispense Refill  . albuterol (VENTOLIN HFA) 108 (90 Base) MCG/ACT inhaler INHALE 2 PUFFS EVERY 6 HOURS AS NEEDED FOR WHEEZING OR SHORTNESS OF BREATH. 18 g 1  . Cholecalciferol (VITAMIN D3) 1000 units CAPS Take 1,000 Units by mouth daily.    . diazepam (VALIUM) 10 MG tablet Take 10 mg by mouth 2 (two) times daily as needed (panic attacks.).     [name] diltiazem (CARDIZEM CD) 240 MG 24 hr capsule TAKE 1 CAPSULE BY MOUTH EVERY MORNING ON AN EMPTY STOMACH - [name] DENIED, FAXED DR Johnsie Cancel 10/22/15 SS (Patient taking differently: Take 240 mg by  mouth daily. ) 30 capsule 5  . FLUoxetine (PROZAC) 40 MG capsule Take 80 mg by mouth at bedtime.     . Fluticasone-Umeclidin-Vilant (TRELEGY ELLIPTA) 100-62.5-25 MCG/INH AEPB Inhale 1 puff into the lungs daily. 2 each 0  . HUMULIN R U-500 KWIKPEN 500 UNIT/ML kwikpen Inject 100 Units into the skin daily.     [name] ibuprofen (ADVIL,MOTRIN) 800 MG tablet Take 800 mg by mouth every 8 (eight) hours as needed (pain).     [name] ipratropium-albuterol (DUONEB) 0.5-2.5 (3) MG/3ML SOLN Take 20m by nebulization every 4-6 hours if needed (Patient taking differently: Inhale 3 mLs into the lungs every 4 (four) hours as needed (wheezing/shortness of breath.). ) 1080 mL 3  . levothyroxine (SYNTHROID, LEVOTHROID) 75 MCG tablet Take 75 mcg by mouth daily before breakfast.    . liraglutide (VICTOZA) 18 MG/3ML SOPN Inject 1.2 mg into the skin every evening.     .Marland Kitchenlosartan (COZAAR) 25 MG tablet Take 25 mg by mouth daily.    . metoCLOPramide (REGLAN) 10 MG tablet Take 10 mg by mouth 3 (three) times daily.    . nebivolol (BYSTOLIC) 10 MG tablet Take 1 tablet (10 mg total) by mouth daily. 90 tablet 3  . nystatin-triamcinolone (MYCOLOG II) cream Apply 1 application topically 2 (two) times daily as needed (yeast in skin folds).     . pantoprazole (PROTONIX) 40  MG tablet Take 1 tablet (40 mg total) by mouth 2 (two) times daily. 180 tablet 4  . pentosan polysulfate (ELMIRON) 100 MG capsule Take 100 mg by mouth 3 (three) times daily.    . promethazine (PHENERGAN) 25 MG tablet Take 25 mg by mouth 2 (two) times daily as needed for nausea.    . SUMAtriptan (IMITREX) 20 MG/ACT nasal spray Place 20 mg into the nose every 2 (two) hours as needed for migraine or headache. May repeat in 2 hours if headache persists or recurs.    .Marland KitchentiZANidine (ZANAFLEX) 4 MG tablet Take 4 mg by mouth 3 (three) times daily. May hold one dose if needed to operate motor vehicle.    .Marland Kitchenatorvastatin (LIPITOR) 80 MG tablet Take 1 tablet (80 mg total) by mouth daily. 30 tablet 0  . clopidogrel (PLAVIX) 75 MG tablet Take 1 tablet (75 mg total) by mouth daily. 60 tablet 0   No current facility-administered medications on file prior to visit.    Allergies:   Allergies  Allergen Reactions  . Influenza Vaccines Anaphylaxis and Swelling    Throat swelling, "flu" symptoms  . Iodinated Diagnostic Agents Shortness Of Breath, Nausea And Vomiting, Nausea Only and Rash  . Iodine-131 Shortness Of Breath, Nausea And Vomiting and Rash  . Iohexol Anaphylaxis, Hives and Swelling     Desc: hives,dyspnea; throat swelling; ok w/ premeds and omnipaque   . Levofloxacin Anaphylaxis and Hives  . Nucynta [Tapentadol Hydrochloride] Other (See Comments)    Hallucinations and  insomnia x3 days  . Pregabalin Other (See Comments)    (lyrica)Reaction-Syncope & unable to speak  . Sulfonamide Derivatives Anaphylaxis  . Vantin [Cefpodoxime] Anaphylaxis, Hives, Rash and Cough  . Vilazodone Hcl Other (See Comments)    (Viibryd) Hallucinations  . Amoxicillin Hives and Itching    Did it involve swelling of the face/tongue/throat, SOB, or low BP? No Did it involve sudden or severe rash/hives, skin peeling, or any reaction on the inside of your mouth or nose? Yes Did you need to seek medical attention at a hospital  or doctor's office? No  When did it last happen?3-4 years ago If all above answers are "NO", may proceed with cephalosporin use. .  . Biaxin [Clarithromycin] Hives  . Carbamazepine Itching    (Tegretol)  . Ciprofloxacin Hives  . Dilaudid [Hydromorphone Hcl] Itching and Rash    redness  . Iodides Nausea Only  . Percocet [Oxycodone-Acetaminophen] Rash    Physical Exam General: Mildly obese middle-aged Caucasian lady, seated, in no evident distress Head: head normocephalic and atraumatic.  Neck: supple with no carotid or supraclavicular bruits Cardiovascular: regular rate and rhythm, no murmurs Musculoskeletal: no deformity Skin:  no rash/petichiae Vascular:  Normal pulses all extremities Vitals:   05/28/20 0924  BP: 125/68  Pulse: 84   Neurologic Exam Mental Status: Awake and fully alert. Oriented to place and time. Recent and remote memory intact. Attention span, concentration and fund of knowledge appropriate. Mood and affect appropriate.  Diminished recall 2/3. Cranial Nerves: Fundoscopic exam reveals sharp disc margins. Pupils equal, briskly reactive to light. Extraocular movements full without nystagmus. Visual fields full to confrontation. Hearing intact. Facial sensation intact. Face, tongue, palate moves normally and symmetrically.  Motor: Normal bulk and tone. Normal strength in all tested extremity muscles.  Diminished fine finger movements on the right.  Orbits left over right upper extremity.  Mild weakness of right ankle dorsiflexors and hip flexors.  Mild intermittent tremor of the right lower extremity. Sensory.: intact to touch ,pinprick .position and vibratory sensation.  Coordination: Rapid alternating movements normal in all extremities. Finger-to-nose and heel-to-shin performed accurately bilaterally. Gait and Station: Arises from chair without difficulty. Stance is normal. Gait demonstrates normal stride length and balance but drags right leg slightly.. Able to  heel, toe and tandem walk with moderate difficulty.  Reflexes: 1+ and symmetric. Toes downgoing.   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  Vascular risk factors of diabetes, hypertension hyperlipidemia, obstructive sleep apnea and coronary artery disease.  She has done well but does have mild poststroke cognitive impairment.  She is participating in the sleep smart study for stroke prevention     PLAN: I had a long d/w patient about her recent stroke,sleep apnoea, risk for recurrent stroke/TIAs, personally independently reviewed imaging studies and stroke evaluation results and answered questions.Continue Plavix 75 mg daily for secondary stroke prevention and maintain strict control of hypertension with blood pressure goal below 130/90, diabetes with hemoglobin A1c goal below 6.5% and lipids with LDL cholesterol goal below 70 mg/dL. I also advised the patient to eat a healthy diet with plenty of whole grains, cereals, fruits and vegetables, exercise regularly and maintain ideal body weight.  I advised her to continue participation in the sleep smart stroke prevention trial and to use her CPAP every night without fail.  I also encouraged her to increase participation in cognitively challenging activities like solving crossword puzzles, playing bridge and sodoku.  We also discussed memory compensation strategies.  Followup in the future as part of sleep smart stroke study protocol or call earlier if necessary. Greater than 50% of time during this 35 minute visit was spent on counseling,explanation of diagnosis, planning of further management, discussion with patient and family and coordination of care Antony Contras, MD Note: This document was prepared with digital dictation and possible smart phrase technology. Any transcriptional errors that result from this process are unintentional

## 2020-05-28 NOTE — Patient Instructions (Signed)
I had a long d/w patient about her recent stroke,sleep apnoea, risk for recurrent stroke/TIAs, personally independently reviewed imaging studies and stroke evaluation results and answered questions.Continue Plavix 75 mg daily for secondary stroke prevention and maintain strict control of hypertension with blood pressure goal below 130/90, diabetes with hemoglobin A1c goal below 6.5% and lipids with LDL cholesterol goal below 70 mg/dL. I also advised the patient to eat a healthy diet with plenty of whole grains, cereals, fruits and vegetables, exercise regularly and maintain ideal body weight.  I advised her to continue participation in the sleep smart stroke prevention trial and to use her CPAP every night without fail.  I also encouraged her to increase participation in cognitively challenging activities like solving crossword puzzles, playing bridge and sodoku.  We also discussed memory compensation strategies.  Followup in the future as part of sleep smart stroke study protocol or call earlier if necessary. Memory Compensation Strategies  1. Use "WARM" strategy.  W= write it down  A= associate it  R= repeat it  M= make a mental note  2.   You can keep a Social worker.  Use a 3-ring notebook with sections for the following: calendar, important names and phone numbers,  medications, doctors' names/phone numbers, lists/reminders, and a section to journal what you did  each day.   3.    Use a calendar to write appointments down.  4.    Write yourself a schedule for the day.  This can be placed on the calendar or in a separate section of the Memory Notebook.  Keeping a  regular schedule can help memory.  5.    Use medication organizer with sections for each day or morning/evening pills.  You may need help loading it  6.    Keep a basket, or pegboard by the door.  Place items that you need to take out with you in the basket or on the pegboard.  You may also want to  include a message board for  reminders.  7.    Use sticky notes.  Place sticky notes with reminders in a place where the task is performed.  For example: " turn off the  stove" placed by the stove, "lock the door" placed on the door at eye level, " take your medications" on  the bathroom mirror or by the place where you normally take your medications.  8.    Use alarms/timers.  Use while cooking to remind yourself to check on food or as a reminder to take your medicine, or as a  reminder to make a call, or as a reminder to perform another task, etc.

## 2020-05-30 ENCOUNTER — Encounter: Payer: Medicare HMO | Admitting: Internal Medicine

## 2020-06-03 LAB — CUP PACEART REMOTE DEVICE CHECK
Date Time Interrogation Session: 20211106142206
Implantable Pulse Generator Implant Date: 20210831

## 2020-06-04 ENCOUNTER — Ambulatory Visit (INDEPENDENT_AMBULATORY_CARE_PROVIDER_SITE_OTHER): Payer: Medicare HMO

## 2020-06-04 DIAGNOSIS — I63512 Cerebral infarction due to unspecified occlusion or stenosis of left middle cerebral artery: Secondary | ICD-10-CM

## 2020-06-04 NOTE — Progress Notes (Signed)
Carelink Summary Report / Loop Recorder 

## 2020-06-07 ENCOUNTER — Encounter: Payer: Medicare HMO | Admitting: Internal Medicine

## 2020-06-08 ENCOUNTER — Other Ambulatory Visit: Payer: Self-pay

## 2020-06-08 NOTE — Patient Outreach (Signed)
[facility]) Care Management  [place]  06/08/2020   Cheryl [name] 1965-09-10 [id number]  Subjective: Telephone call to patient for follow up. Patient reports doing good.  However, she reports some dropping of blood sugar in the mornings.  Discussed diabetes and control and importance of complex carbohydrate snack at night to help sustain sugars.  She verbalized understanding.  Also discussed importance of remaining active in order to maintain cognitive and physical abilities following her stroke.  She verbalized understanding and voices no concerns.  Objective:   Encounter Medications:  Outpatient Encounter Medications as of 06/08/2020  Medication Sig Note  . albuterol (VENTOLIN HFA) 108 (90 Base) MCG/ACT inhaler INHALE 2 PUFFS EVERY 6 HOURS AS NEEDED FOR WHEEZING OR SHORTNESS OF BREATH.   [name] atorvastatin (LIPITOR) 80 MG tablet Take 1 tablet (80 mg total) by mouth daily.   . Cholecalciferol (VITAMIN D3) 1000 units CAPS Take 1,000 Units by mouth daily.   . clopidogrel (PLAVIX) 75 MG tablet Take 1 tablet (75 mg total) by mouth daily.   . diazepam (VALIUM) 10 MG tablet Take 10 mg by mouth 2 (two) times daily as needed (panic attacks.).    [name] diltiazem (CARDIZEM CD) 240 MG 24 hr capsule TAKE 1 CAPSULE BY MOUTH EVERY MORNING ON AN EMPTY STOMACH - [name] DENIED, FAXED DR Johnsie Cancel 10/22/15 SS (Patient taking differently: Take 240 mg by mouth daily. )   . FLUoxetine (PROZAC) 40 MG capsule Take 80 mg by mouth at bedtime.    . Fluticasone-Umeclidin-Vilant (TRELEGY ELLIPTA) 100-62.5-25 MCG/INH AEPB Inhale 1 puff into the lungs daily.   [name] HUMULIN R U-500 KWIKPEN 500 UNIT/ML kwikpen Inject 100 Units into the skin daily.  04/24/2020: Taking 130 units am and 30 units in the PM  . ibuprofen (ADVIL,MOTRIN) 800 MG tablet Take 800 mg by mouth every 8 (eight) hours as needed (pain).    [name] ipratropium-albuterol (DUONEB) 0.5-2.5 (3) MG/3ML SOLN Take 59m by nebulization every 4-6  hours if needed (Patient taking differently: Inhale 3 mLs into the lungs every 4 (four) hours as needed (wheezing/shortness of breath.). )   . levothyroxine (SYNTHROID, LEVOTHROID) 75 MCG tablet Take 75 mcg by mouth daily before breakfast.   . liraglutide (VICTOZA) 18 MG/3ML SOPN Inject 1.2 mg into the skin every evening.    .Marland Kitchenlosartan (COZAAR) 25 MG tablet Take 25 mg by mouth daily.   . metoCLOPramide (REGLAN) 10 MG tablet Take 10 mg by mouth 3 (three) times daily.   . nebivolol (BYSTOLIC) 10 MG tablet Take 1 tablet (10 mg total) by mouth daily.   .Marland Kitchennystatin-triamcinolone (MYCOLOG II) cream Apply 1 application topically 2 (two) times daily as needed (yeast in skin folds).    . pantoprazole (PROTONIX) 40 MG tablet Take 1 tablet (40 mg total) by mouth 2 (two) times daily.   . pentosan polysulfate (ELMIRON) 100 MG capsule Take 100 mg by mouth 3 (three) times daily.   . promethazine (PHENERGAN) 25 MG tablet Take 25 mg by mouth 2 (two) times daily as needed for nausea.   . SUMAtriptan (IMITREX) 20 MG/ACT nasal spray Place 20 mg into the nose every 2 (two) hours as needed for migraine or headache. May repeat in 2 hours if headache persists or recurs.   .Marland KitchentiZANidine (ZANAFLEX) 4 MG tablet Take 4 mg by mouth 3 (three) times daily. May hold one dose if needed to operate motor vehicle.    No facility-administered encounter medications on file as of 06/08/2020.  Functional Status:  In your present state of health, do you have any difficulty performing the following activities: 03/30/2020 09/27/2019  Hearing? N N  Vision? N N  Difficulty concentrating or making decisions? Y N  Comment recent stroke -  Walking or climbing stairs? Y N  Comment recent stroke -  Dressing or bathing? N N  Doing errands, shopping? Aggie Moats  Comment daughter assists -  Conservation officer, nature and eating ? Y N  Comment family helps -  Using the Toilet? N N  In the past six months, have you accidently leaked urine? N N  Do you have  problems with loss of bowel control? N N  Managing your Medications? Aggie Moats  Comment daughter [place] your Finances? Aggie Moats  Comment daughter [name] helps -  Housekeeping or managing your Housekeeping? Y N  Comment daughter [name] Helps -  Some recent data might be hidden    Fall/Depression Screening: Fall Risk  03/30/2020 09/27/2019 07/31/2017  Falls in the past year? 1 0 No  Comment - - -  Number falls in past yr: 1 - -  Injury with Fall? 0 - -  Risk Factor Category  - - -  Risk for fall due to : History of fall(s) - -  Follow up Falls prevention discussed - -   PHQ 2/9 Scores 06/08/2020 03/30/2020 09/27/2019 07/31/2017 07/03/2017 05/18/2017 03/24/2017  PHQ - 2 Score 0 1 1 1 1 1 1   PHQ- 9 Score - - - - - - -    Assessment: Patient continues to manage chronic conditions and benefits from disease management follow up.   Goals Addressed            This Visit's Progress   . THN-Medication Adherence Maintained   On track    Evidence-based guidance:  Develop a complete and accurate medication list including those prescribed and over-the-counter, those taken only occasionally and those not taken by mouth such as injections, inhalers, ointments or creams and drops.  Review all medications to determine if patient or caregiver knows why the medications are given and if taken as prescribed.  Complete or review a medication adherence assessment including barriers to medication adherence.  Arrange and encourage counseling and medication review by pharmacist.  Assess barriers to medication adherence.  Manage poor understanding or health literacy by using easy to understand language, teach-back, visual aids and teaching only 2 or 3 points at a time.  Assess presence of side effects; provide suggestions to manage or reduce side effects.  Consult with provider and/or pharmacist regarding substitute medication, changing dose, simplification of regimen or safe discontinuation of some  medications.  Encourage the use of medication reminders such as clock or cell phone alarm, color coding, pillboxes for am/pm and days of the week, pharmacy refill reminder, auto-refill system or mail-order option.  Assist with resources when cost is a barrier; refer to prescription assistance programs; confirm that generics are prescribed whenever possible; consider 90-day prescriptions to reduce copay cost; synchronize refills.  Provide help to complete medication assistance applications or health insurance forms as needed.  Complete a follow-up call 2 to 3 weeks after medication self-management plan developed; assess adherence and understanding, as well as listen to patient or caregiver concerns; amend plan as needed.  Provide frequent follow-up providing motivation, encouragement and support when medication nonadherence is identified.   Notes:     . THN-Monitor and Manage My Blood Sugar   On track    Follow  Up Date 07/26/20   - check blood sugar at prescribed times - check blood sugar if I feel it is too high or too low - take the blood sugar meter to all doctor visits    Why is this important?   Checking your blood sugar at home helps to keep it from getting very high or very low.  Writing the results in a diary or log helps the doctor know how to care for you.  Your blood sugar log should have the time, date and the results.  Also, write down the amount of insulin or other medicine that you take.  Other information, like what you ate, exercise done and how you were feeling, will also be helpful.     Notes: Patient blood sugars lower with new insulin dosing.  Last check 132. 05/22/20 continue monitoring sugars and limiting carbohydrates.         Plan: RN CM will outreach next month and patient agreeable.   [name], RN, MSN [facility] Care Management Coordinator Direct Line [phone] Cell [phone] Toll Free: [phone]  Fax: [phone]

## 2020-06-11 ENCOUNTER — Ambulatory Visit: Payer: Self-pay

## 2020-06-21 ENCOUNTER — Other Ambulatory Visit: Payer: Self-pay | Admitting: Internal Medicine

## 2020-06-22 DIAGNOSIS — E1165 Type 2 diabetes mellitus with hyperglycemia: Secondary | ICD-10-CM | POA: Diagnosis not present

## 2020-07-02 DIAGNOSIS — M25511 Pain in right shoulder: Secondary | ICD-10-CM | POA: Diagnosis not present

## 2020-07-02 DIAGNOSIS — M25551 Pain in right hip: Secondary | ICD-10-CM | POA: Diagnosis not present

## 2020-07-02 DIAGNOSIS — M7061 Trochanteric bursitis, right hip: Secondary | ICD-10-CM | POA: Diagnosis not present

## 2020-07-02 DIAGNOSIS — M25561 Pain in right knee: Secondary | ICD-10-CM | POA: Diagnosis not present

## 2020-07-02 DIAGNOSIS — M67911 Unspecified disorder of synovium and tendon, right shoulder: Secondary | ICD-10-CM | POA: Diagnosis not present

## 2020-07-03 ENCOUNTER — Other Ambulatory Visit: Payer: Self-pay | Admitting: Emergency Medicine

## 2020-07-03 ENCOUNTER — Telehealth: Payer: Self-pay | Admitting: Neurology

## 2020-07-03 MED ORDER — CLOPIDOGREL BISULFATE 75 MG PO TABS: 75.0000 mg | ORAL_TABLET | Freq: Every day | ORAL | 1 refills | Status: DC

## 2020-07-03 NOTE — Telephone Encounter (Signed)
[employer] called asking for refill for pt on [facility]idogrel 76m [facility]

## 2020-07-04 DIAGNOSIS — R519 Headache, unspecified: Secondary | ICD-10-CM | POA: Diagnosis not present

## 2020-07-05 DIAGNOSIS — E1142 Type 2 diabetes mellitus with diabetic polyneuropathy: Secondary | ICD-10-CM | POA: Diagnosis not present

## 2020-07-05 DIAGNOSIS — E1165 Type 2 diabetes mellitus with hyperglycemia: Secondary | ICD-10-CM | POA: Diagnosis not present

## 2020-07-05 DIAGNOSIS — Z794 Long term (current) use of insulin: Secondary | ICD-10-CM | POA: Diagnosis not present

## 2020-07-05 DIAGNOSIS — E039 Hypothyroidism, unspecified: Secondary | ICD-10-CM | POA: Diagnosis not present

## 2020-07-05 DIAGNOSIS — E782 Mixed hyperlipidemia: Secondary | ICD-10-CM | POA: Diagnosis not present

## 2020-07-06 ENCOUNTER — Other Ambulatory Visit: Payer: Self-pay

## 2020-07-06 NOTE — Patient Outreach (Signed)
[facility]) Care Management  [place]  07/06/2020   Cheryl [name] 01-15-1966 [id number]  Subjective: Telephone call to patient for follow up. Patient reports she is doing. She reports having some problems with headaches. She reports having a scan to be scheduled due to her history of stroke.  Discussed stroke and pain management.  She reports that her sugars are good and that Dr. [name] was pleased.  Last A1c noted to be 6.5.   Discussed continued diabetes management.  She verbalized understanding.   Objective:   Encounter Medications:  Outpatient Encounter Medications as of 07/06/2020  Medication Sig Note  . albuterol (VENTOLIN HFA) 108 (90 Base) MCG/ACT inhaler INHALE 2 PUFFS EVERY 6 HOURS AS NEEDED FOR WHEEZING OR SHORTNESS OF BREATH.   [name] atorvastatin (LIPITOR) 80 MG tablet Take 1 tablet (80 mg total) by mouth daily.   . Cholecalciferol (VITAMIN D3) 1000 units CAPS Take 1,000 Units by mouth daily.   . clopidogrel (PLAVIX) 75 MG tablet Take 1 tablet (75 mg total) by mouth daily.   . diazepam (VALIUM) 10 MG tablet Take 10 mg by mouth 2 (two) times daily as needed (panic attacks.).    [name] diltiazem (CARDIZEM CD) 240 MG 24 hr capsule TAKE 1 CAPSULE BY MOUTH EVERY MORNING ON AN EMPTY STOMACH - [name] DENIED, FAXED DR Johnsie Cancel 10/22/15 SS (Patient taking differently: Take 240 mg by mouth daily. )   . FLUoxetine (PROZAC) 40 MG capsule Take 80 mg by mouth at bedtime.    . Fluticasone-Umeclidin-Vilant (TRELEGY ELLIPTA) 100-62.5-25 MCG/INH AEPB Inhale 1 puff into the lungs daily.   [name] HUMULIN R U-500 KWIKPEN 500 UNIT/ML kwikpen Inject 100 Units into the skin daily.  04/24/2020: Taking 130 units am and 30 units in the PM  . ibuprofen (ADVIL,MOTRIN) 800 MG tablet Take 800 mg by mouth every 8 (eight) hours as needed (pain).    [name] ipratropium-albuterol (DUONEB) 0.5-2.5 (3) MG/3ML SOLN Take 67m by nebulization every 4-6 hours if needed (Patient taking differently: Inhale 3  mLs into the lungs every 4 (four) hours as needed (wheezing/shortness of breath.). )   . levothyroxine (SYNTHROID, LEVOTHROID) 75 MCG tablet Take 75 mcg by mouth daily before breakfast.   . liraglutide (VICTOZA) 18 MG/3ML SOPN Inject 1.2 mg into the skin every evening.    .Marland Kitchenlosartan (COZAAR) 25 MG tablet Take 25 mg by mouth daily.   . metoCLOPramide (REGLAN) 10 MG tablet Take 10 mg by mouth 3 (three) times daily.   . nebivolol (BYSTOLIC) 10 MG tablet Take 1 tablet (10 mg total) by mouth daily.   .Marland Kitchennystatin-triamcinolone (MYCOLOG II) cream Apply 1 application topically 2 (two) times daily as needed (yeast in skin folds).    . pantoprazole (PROTONIX) 40 MG tablet Take 1 tablet (40 mg total) by mouth 2 (two) times daily.   . pentosan polysulfate (ELMIRON) 100 MG capsule Take 100 mg by mouth 3 (three) times daily.   . promethazine (PHENERGAN) 25 MG tablet Take 25 mg by mouth 2 (two) times daily as needed for nausea.   . SUMAtriptan (IMITREX) 20 MG/ACT nasal spray Place 20 mg into the nose every 2 (two) hours as needed for migraine or headache. May repeat in 2 hours if headache persists or recurs.   .Marland KitchentiZANidine (ZANAFLEX) 4 MG tablet Take 4 mg by mouth 3 (three) times daily. May hold one dose if needed to operate motor vehicle.    No facility-administered encounter medications on file as of  07/06/2020.    Functional Status:  In your present state of health, do you have any difficulty performing the following activities: 07/06/2020 03/30/2020  Hearing? N N  Vision? N N  Difficulty concentrating or making decisions? Y Y  Comment short term memory loss recent stroke  Walking or climbing stairs? [name]  Comment Stroke history recent stroke  Dressing or bathing? N N  Doing errands, shopping? Y Y  Comment daugher and mother assist daughter assists  Conservation officer, nature and eating ? [name]  Comment family helps family helps  Using the Toilet? N N  In the past six months, have you accidently leaked urine? N N   Do you have problems with loss of bowel control? N N  Managing your Medications? [name]  Comment daughter Helps daughter [name] Helps  Managing your Finances? [name]  Comment daughter helps daughter [name] helps  Housekeeping or managing your Housekeeping? [name]  Comment daughter helps daughter [name] Helps  Some recent data might be hidden    Fall/Depression Screening: Fall Risk  07/06/2020 03/30/2020 09/27/2019  Falls in the past year? 1 1 0  Comment - - -  Number falls in past yr: 1 1 -  Injury with Fall? 0 0 -  Risk Factor Category  - - -  Risk for fall due to : History of fall(s) History of fall(s) -  Follow up Falls prevention discussed Falls prevention discussed -   PHQ 2/9 Scores 07/06/2020 06/08/2020 03/30/2020 09/27/2019 07/31/2017 07/03/2017 05/18/2017  PHQ - 2 Score 0 0 1 1 1 1 1   PHQ- 9 Score - - - - - - -    Assessment: Patient continues to manage chronic conditions and see physicians as scheduled.  Patient benefits from care manager outreach.  Goals Addressed            This Visit's Progress   . Make and Keep All Appointments   On track    Timeframe:  Long-Range Goal Priority:  High Start Date:  07/06/20                           Expected End Date:  08/27/20                        - keep a calendar with appointment dates    Why is this important?    Part of staying healthy is seeing the doctor for follow-up care.   If you forget your appointments, there are some things you can do to stay on track.    Notes:     . COMPLETED: THN-Medication Adherence Maintained   On track    Evidence-based guidance:  Develop a complete and accurate medication list including those prescribed and over-the-counter, those taken only occasionally and those not taken by mouth such as injections, inhalers, ointments or creams and drops.  Review all medications to determine if patient or caregiver knows why the medications are given and if taken as prescribed.  Complete or review a  medication adherence assessment including barriers to medication adherence.  Arrange and encourage counseling and medication review by pharmacist.  Assess barriers to medication adherence.  Manage poor understanding or health literacy by using easy to understand language, teach-back, visual aids and teaching only 2 or 3 points at a time.  Assess presence of side effects; provide suggestions to manage or reduce side effects.  Consult with provider and/or pharmacist regarding  substitute medication, changing dose, simplification of regimen or safe discontinuation of some medications.  Encourage the use of medication reminders such as clock or cell phone alarm, color coding, pillboxes for am/pm and days of the week, pharmacy refill reminder, auto-refill system or mail-order option.  Assist with resources when cost is a barrier; refer to prescription assistance programs; confirm that generics are prescribed whenever possible; consider 90-day prescriptions to reduce copay cost; synchronize refills.  Provide help to complete medication assistance applications or health insurance forms as needed.  Complete a follow-up call 2 to 3 weeks after medication self-management plan developed; assess adherence and understanding, as well as listen to patient or caregiver concerns; amend plan as needed.  Provide frequent follow-up providing motivation, encouragement and support when medication nonadherence is identified.   Notes:     . THN-Monitor and Manage My Blood Sugar   On track    Timeframe:  Long-Range Goal Priority:  High Start Date:  07/06/20                            Expected End Date: 08/27/20             - check blood sugar at prescribed times - check blood sugar if I feel it is too high or too low - take the blood sugar meter to all doctor visits    Why is this important?   Checking your blood sugar at home helps to keep it from getting very high or very low.  Writing the results in a diary or log  helps the doctor know how to care for you.  Your blood sugar log should have the time, date and the results.  Also, write down the amount of insulin or other medicine that you take.  Other information, like what you ate, exercise done and how you were feeling, will also be helpful.     Notes: Patient blood sugars lower with new insulin dosing.  Last check 132. 05/22/20 continue monitoring sugars and limiting carbohydrates.         Plan: RN CM will contact next month. Follow-up:  Patient agrees to Care Plan and Follow-up. RN CM will send update to physician.   [name], RN, MSN [place] Management Care Management Coordinator Direct Line [phone] Cell [phone] Toll Free: [phone]  Fax: [phone]

## 2020-07-06 NOTE — Patient Instructions (Signed)
Goals Addressed            This Visit's Progress   . Make and Keep All Appointments   On track    Timeframe:  Long-Range Goal Priority:  High Start Date:  07/06/20                           Expected End Date:  08/27/20                        - keep a calendar with appointment dates    Why is this important?    Part of staying healthy is seeing the doctor for follow-up care.   If you forget your appointments, there are some things you can do to stay on track.    Notes:     . COMPLETED: THN-Medication Adherence Maintained   On track    Evidence-based guidance:  Develop a complete and accurate medication list including those prescribed and over-the-counter, those taken only occasionally and those not taken by mouth such as injections, inhalers, ointments or creams and drops.  Review all medications to determine if patient or caregiver knows why the medications are given and if taken as prescribed.  Complete or review a medication adherence assessment including barriers to medication adherence.  Arrange and encourage counseling and medication review by pharmacist.  Assess barriers to medication adherence.  Manage poor understanding or health literacy by using easy to understand language, teach-back, visual aids and teaching only 2 or 3 points at a time.  Assess presence of side effects; provide suggestions to manage or reduce side effects.  Consult with provider and/or pharmacist regarding substitute medication, changing dose, simplification of regimen or safe discontinuation of some medications.  Encourage the use of medication reminders such as clock or cell phone alarm, color coding, pillboxes for am/pm and days of the week, pharmacy refill reminder, auto-refill system or mail-order option.  Assist with resources when cost is a barrier; refer to prescription assistance programs; confirm that generics are prescribed whenever possible; consider 90-day prescriptions to reduce copay cost;  synchronize refills.  Provide help to complete medication assistance applications or health insurance forms as needed.  Complete a follow-up call 2 to 3 weeks after medication self-management plan developed; assess adherence and understanding, as well as listen to patient or caregiver concerns; amend plan as needed.  Provide frequent follow-up providing motivation, encouragement and support when medication nonadherence is identified.   Notes:     . THN-Monitor and Manage My Blood Sugar   On track    Timeframe:  Long-Range Goal Priority:  High Start Date:  07/06/20                            Expected End Date: 08/27/20             - check blood sugar at prescribed times - check blood sugar if I feel it is too high or too low - take the blood sugar meter to all doctor visits    Why is this important?   Checking your blood sugar at home helps to keep it from getting very high or very low.  Writing the results in a diary or log helps the doctor know how to care for you.  Your blood sugar log should have the time, date and the results.  Also, write down the amount of insulin or  other medicine that you take.  Other information, like what you ate, exercise done and how you were feeling, will also be helpful.     Notes: Patient blood sugars lower with new insulin dosing.  Last check 132. 05/22/20 continue monitoring sugars and limiting carbohydrates.

## 2020-07-07 LAB — CUP PACEART REMOTE DEVICE CHECK
Date Time Interrogation Session: 20211209142021
Implantable Pulse Generator Implant Date: 20210831

## 2020-07-09 ENCOUNTER — Other Ambulatory Visit: Payer: Self-pay | Admitting: Family Medicine

## 2020-07-09 ENCOUNTER — Ambulatory Visit (INDEPENDENT_AMBULATORY_CARE_PROVIDER_SITE_OTHER): Payer: Medicare HMO

## 2020-07-09 DIAGNOSIS — I63512 Cerebral infarction due to unspecified occlusion or stenosis of left middle cerebral artery: Secondary | ICD-10-CM

## 2020-07-09 DIAGNOSIS — R519 Headache, unspecified: Secondary | ICD-10-CM

## 2020-07-11 ENCOUNTER — Other Ambulatory Visit: Payer: Self-pay | Admitting: Emergency Medicine

## 2020-07-11 MED ORDER — CLOPIDOGREL BISULFATE 75 MG PO TABS: 75.0000 mg | ORAL_TABLET | Freq: Every day | ORAL | 1 refills | Status: DC

## 2020-07-23 NOTE — Progress Notes (Signed)
Carelink Summary Report / Loop Recorder 

## 2020-08-02 DIAGNOSIS — E1165 Type 2 diabetes mellitus with hyperglycemia: Secondary | ICD-10-CM | POA: Diagnosis not present

## 2020-08-06 ENCOUNTER — Other Ambulatory Visit: Payer: Medicare HMO

## 2020-08-08 ENCOUNTER — Other Ambulatory Visit: Payer: Self-pay

## 2020-08-08 ENCOUNTER — Ambulatory Visit
Admission: RE | Admit: 2020-08-08 | Discharge: 2020-08-08 | Disposition: A | Discharge status: Home / Self Care | Payer: Medicare HMO | Source: Ambulatory Visit | Attending: Family Medicine | Admitting: Family Medicine

## 2020-08-08 DIAGNOSIS — R519 Headache, unspecified: Secondary | ICD-10-CM | POA: Diagnosis not present

## 2020-08-08 MED ORDER — GADOBENATE DIMEGLUMINE 529 MG/ML IV SOLN
19.0000 mL | Freq: Once | INTRAVENOUS | Status: AC | PRN
  Administered 2020-08-08: 19 mL via INTRAVENOUS

## 2020-08-10 ENCOUNTER — Other Ambulatory Visit: Payer: Self-pay

## 2020-08-10 NOTE — Patient Outreach (Signed)
[name]) Care Management  08/10/2020  Cheryl [name] 03-29-1966 [id number]   Telephone call to patient for disease management follow up.   No answer.  HIPAA compliant voice message left.    Plan: If no return call, RN CM will attempt patient again in February.    [name], RN, MSN [place] Management Care Management Coordinator Direct Line [phone] Cell [phone] Toll Free: [phone]  Fax: [phone]

## 2020-08-13 ENCOUNTER — Ambulatory Visit (INDEPENDENT_AMBULATORY_CARE_PROVIDER_SITE_OTHER): Payer: Medicare HMO

## 2020-08-13 DIAGNOSIS — I639 Cerebral infarction, unspecified: Secondary | ICD-10-CM

## 2020-08-13 LAB — CUP PACEART REMOTE DEVICE CHECK
Date Time Interrogation Session: 20220111142134
Implantable Pulse Generator Implant Date: 20210831

## 2020-08-25 NOTE — Progress Notes (Signed)
Carelink Summary Report / Loop Recorder 

## 2020-08-30 ENCOUNTER — Other Ambulatory Visit: Payer: Self-pay

## 2020-08-30 NOTE — Patient Outreach (Signed)
[facility]heryl [name]) Care Management  [name]  08/30/2020   Cheryl [name] 13-May-1966 [id number]  Subjective: Transfer call from patient. She reports she is not feeling her best. She reports that she is having some headaches and sinus issues.  Discussed with patient if symptoms do not get better to follow up with PCP. She verbalized understanding. She states she had another CT scan done and no new changes from last CT scan.  She reports some forgetfulness.  Encouraged that use of puzzles to help with her mind and memory. She verbalized understanding. Patient sugars are in better control with her [place] machine.  Recent sugar 162.  Discussed importance of regular eating and healthy eating. She verbalized understanding.    Objective:   Encounter Medications:  Outpatient Encounter Medications as of 08/30/2020  Medication Sig Note  . albuterol (VENTOLIN HFA) 108 (90 Base) MCG/ACT inhaler INHALE 2 PUFFS EVERY 6 HOURS AS NEEDED FOR WHEEZING OR SHORTNESS OF BREATH.   [name] atorvastatin (LIPITOR) 80 MG tablet Take 1 tablet (80 mg total) by mouth daily.   . Cholecalciferol (VITAMIN D3) 1000 units CAPS Take 1,000 Units by mouth daily.   . clopidogrel (PLAVIX) 75 MG tablet Take 1 tablet (75 mg total) by mouth daily.   . diazepam (VALIUM) 10 MG tablet Take 10 mg by mouth 2 (two) times daily as needed (panic attacks.).    [name] diltiazem (CARDIZEM CD) 240 MG 24 hr capsule TAKE 1 CAPSULE BY MOUTH EVERY MORNING ON AN EMPTY STOMACH - [name] DENIED, FAXED DR Johnsie Cancel 10/22/15 SS (Patient taking differently: Take 240 mg by mouth daily. )   . FLUoxetine (PROZAC) 40 MG capsule Take 80 mg by mouth at bedtime.    . Fluticasone-Umeclidin-Vilant (TRELEGY ELLIPTA) 100-62.5-25 MCG/INH AEPB Inhale 1 puff into the lungs daily.   [name] HUMULIN R U-500 KWIKPEN 500 UNIT/ML kwikpen Inject 100 Units into the skin daily.  04/24/2020: Taking 130 units am and 30 units in the PM  . ibuprofen (ADVIL,MOTRIN) 800 MG tablet  Take 800 mg by mouth every 8 (eight) hours as needed (pain).    [name] ipratropium-albuterol (DUONEB) 0.5-2.5 (3) MG/3ML SOLN Take 34m by nebulization every 4-6 hours if needed (Patient taking differently: Inhale 3 mLs into the lungs every 4 (four) hours as needed (wheezing/shortness of breath.). )   . levothyroxine (SYNTHROID, LEVOTHROID) 75 MCG tablet Take 75 mcg by mouth daily before breakfast.   . liraglutide (VICTOZA) 18 MG/3ML SOPN Inject 1.2 mg into the skin every evening.    .Marland Kitchenlosartan (COZAAR) 25 MG tablet Take 25 mg by mouth daily.   . metoCLOPramide (REGLAN) 10 MG tablet Take 10 mg by mouth 3 (three) times daily.   . nebivolol (BYSTOLIC) 10 MG tablet Take 1 tablet (10 mg total) by mouth daily.   .Marland Kitchennystatin-triamcinolone (MYCOLOG II) cream Apply 1 application topically 2 (two) times daily as needed (yeast in skin folds).    . pantoprazole (PROTONIX) 40 MG tablet Take 1 tablet (40 mg total) by mouth 2 (two) times daily.   . pentosan polysulfate (ELMIRON) 100 MG capsule Take 100 mg by mouth 3 (three) times daily.   . promethazine (PHENERGAN) 25 MG tablet Take 25 mg by mouth 2 (two) times daily as needed for nausea.   . SUMAtriptan (IMITREX) 20 MG/ACT nasal spray Place 20 mg into the nose every 2 (two) hours as needed for migraine or headache. May repeat in 2 hours if headache persists or recurs.   .[name]  tiZANidine (ZANAFLEX) 4 MG tablet Take 4 mg by mouth 3 (three) times daily. May hold one dose if needed to operate motor vehicle.    No facility-administered encounter medications on file as of 08/30/2020.    Functional Status:  In your present state of health, do you have any difficulty performing the following activities: 07/06/2020 03/30/2020  Hearing? N N  Vision? N N  Difficulty concentrating or making decisions? Y Y  Comment short term memory loss recent stroke  Walking or climbing stairs? Cheryl [name]  Comment Stroke history recent stroke  Dressing or bathing? N N  Doing errands, shopping? Y Y   Comment daugher and mother assist daughter assists  Conservation officer, nature and eating ? Cheryl [name]  Comment family helps family helps  Using the Toilet? N N  In the past six months, have you accidently leaked urine? N N  Do you have problems with loss of bowel control? N N  Managing your Medications? Cheryl [name]  Comment daughter Helps daughter [name] Helps  Managing your Finances? Cheryl [name]  Comment daughter helps daughter [name] helps  Housekeeping or managing your Housekeeping? Cheryl [name]  Comment daughter helps daughter [name] Helps  Some recent data might be hidden    Fall/Depression Screening: Fall Risk  07/06/2020 03/30/2020 09/27/2019  Falls in the past year? 1 1 0  Comment - - -  Number falls in past yr: 1 1 -  Injury with Fall? 0 0 -  Risk Factor Category  - - -  Risk for fall due to : History of fall(s) History of fall(s) -  Follow up Falls prevention discussed Falls prevention discussed -   PHQ 2/9 Scores 07/06/2020 06/08/2020 03/30/2020 09/27/2019 07/31/2017 07/03/2017 05/18/2017  PHQ - 2 Score 0 0 1 1 1 1 1   PHQ- 9 Score - - - - - - -    Assessment: Patient managing chronic conditions.  Goals Addressed            This Visit's Progress   . Make and Keep All Appointments   On track    Timeframe:  Long-Range Goal Priority:  High Start Date:  07/06/20                           Expected End Date:  03/27/21                  Follow up: 10/25/20   - arrange a ride through an agency 1 week before appointment    Why is this important?    Part of staying healthy is seeing the doctor for follow-up care.   If you forget your appointments, there are some things you can do to stay on track.    Notes: Patient sees physicians regularly.      . THN-Monitor and Manage My Blood Sugar   On track    Timeframe:  Long-Range Goal Priority:  High Start Date:  07/06/20                            Expected End Date: 03/27/21  Follow up: 10/25/20            - take the blood sugar meter to all doctor visits     Why is this important?   Checking your blood sugar at home helps to keep it from getting very high or very low.  Writing the results in a diary  or log helps the doctor know how to care for you.  Your blood sugar log should have the time, date and the results.  Also, write down the amount of insulin or other medicine that you take.  Other information, like what you ate, exercise done and how you were feeling, will also be helpful.     Notes: Patient blood sugars lower with new insulin dosing.  Last check 132. 05/22/20 continue monitoring sugars and limiting carbohydrates.  08/30/20 Patient reports sugar are better with [facility].        Plan: RN CM will contact next month. Follow-up:  Patient agrees to Care Plan and Follow-up.   [name], RN, MSN [place] Management Care Management Coordinator Direct Line [phone] Cell [phone] Toll Free: [phone]  Fax: [phone]

## 2020-09-04 DIAGNOSIS — E1165 Type 2 diabetes mellitus with hyperglycemia: Secondary | ICD-10-CM | POA: Diagnosis not present

## 2020-09-11 DIAGNOSIS — G8929 Other chronic pain: Secondary | ICD-10-CM | POA: Diagnosis not present

## 2020-09-11 DIAGNOSIS — G43009 Migraine without aura, not intractable, without status migrainosus: Secondary | ICD-10-CM | POA: Diagnosis not present

## 2020-09-11 DIAGNOSIS — M546 Pain in thoracic spine: Secondary | ICD-10-CM | POA: Diagnosis not present

## 2020-09-12 LAB — CUP PACEART REMOTE DEVICE CHECK
Date Time Interrogation Session: 20220213142147
Implantable Pulse Generator Implant Date: 20210831

## 2020-09-13 ENCOUNTER — Ambulatory Visit
Admission: RE | Admit: 2020-09-13 | Discharge: 2020-09-13 | Disposition: A | Discharge status: Home / Self Care | Payer: Medicare HMO | Source: Ambulatory Visit | Attending: Family Medicine | Admitting: Family Medicine

## 2020-09-13 ENCOUNTER — Other Ambulatory Visit: Payer: Self-pay | Admitting: Family Medicine

## 2020-09-13 DIAGNOSIS — M546 Pain in thoracic spine: Secondary | ICD-10-CM | POA: Diagnosis not present

## 2020-09-13 DIAGNOSIS — Z9889 Other specified postprocedural states: Secondary | ICD-10-CM | POA: Diagnosis not present

## 2020-09-13 DIAGNOSIS — M549 Dorsalgia, unspecified: Secondary | ICD-10-CM

## 2020-09-17 ENCOUNTER — Ambulatory Visit (INDEPENDENT_AMBULATORY_CARE_PROVIDER_SITE_OTHER): Payer: Medicare HMO

## 2020-09-17 DIAGNOSIS — I639 Cerebral infarction, unspecified: Secondary | ICD-10-CM | POA: Diagnosis not present

## 2020-09-19 ENCOUNTER — Ambulatory Visit: Payer: Self-pay

## 2020-09-21 NOTE — Progress Notes (Signed)
Carelink Summary Report / Loop Recorder 

## 2020-09-27 ENCOUNTER — Other Ambulatory Visit: Payer: Self-pay

## 2020-09-27 NOTE — Patient Outreach (Signed)
[facility]) Care Management  [place]  09/27/2020   Cheryl [name] 09/18/1965 [id number]  Subjective: Telephone call to patient for follow up. Patient reports a fall about 2-3 weeks ago refused to go to ER or urgent care.  She reports having some bruising to her face as she hit her head.  Discussed fall precautions.  Patient reports blood sugars are up and down. Discussed blood sugars, eating regularly, and notifying physician for continued change.  She verbalized understanding.    Objective:   Encounter Medications:  Outpatient Encounter Medications as of 09/27/2020  Medication Sig Note  . albuterol (VENTOLIN HFA) 108 (90 Base) MCG/ACT inhaler INHALE 2 PUFFS EVERY 6 HOURS AS NEEDED FOR WHEEZING OR SHORTNESS OF BREATH.   [name] atorvastatin (LIPITOR) 80 MG tablet Take 1 tablet (80 mg total) by mouth daily.   . Cholecalciferol (VITAMIN D3) 1000 units CAPS Take 1,000 Units by mouth daily.   . clopidogrel (PLAVIX) 75 MG tablet Take 1 tablet (75 mg total) by mouth daily.   . diazepam (VALIUM) 10 MG tablet Take 10 mg by mouth 2 (two) times daily as needed (panic attacks.).    [name] diltiazem (CARDIZEM CD) 240 MG 24 hr capsule TAKE 1 CAPSULE BY MOUTH EVERY MORNING ON AN EMPTY STOMACH - [name] DENIED, FAXED DR Johnsie Cancel 10/22/15 SS (Patient taking differently: Take 240 mg by mouth daily. )   . FLUoxetine (PROZAC) 40 MG capsule Take 80 mg by mouth at bedtime.    . Fluticasone-Umeclidin-Vilant (TRELEGY ELLIPTA) 100-62.5-25 MCG/INH AEPB Inhale 1 puff into the lungs daily.   [name] HUMULIN R U-500 KWIKPEN 500 UNIT/ML kwikpen Inject 100 Units into the skin daily.  04/24/2020: Taking 130 units am and 30 units in the PM  . ibuprofen (ADVIL,MOTRIN) 800 MG tablet Take 800 mg by mouth every 8 (eight) hours as needed (pain).    [name] ipratropium-albuterol (DUONEB) 0.5-2.5 (3) MG/3ML SOLN Take 95m by nebulization every 4-6 hours if needed (Patient taking differently: Inhale 3 mLs into the lungs every  4 (four) hours as needed (wheezing/shortness of breath.). )   . levothyroxine (SYNTHROID, LEVOTHROID) 75 MCG tablet Take 75 mcg by mouth daily before breakfast.   . liraglutide (VICTOZA) 18 MG/3ML SOPN Inject 1.2 mg into the skin every evening.    .Marland Kitchenlosartan (COZAAR) 25 MG tablet Take 25 mg by mouth daily.   . metoCLOPramide (REGLAN) 10 MG tablet Take 10 mg by mouth 3 (three) times daily.   . nebivolol (BYSTOLIC) 10 MG tablet Take 1 tablet (10 mg total) by mouth daily.   .Marland Kitchennystatin-triamcinolone (MYCOLOG II) cream Apply 1 application topically 2 (two) times daily as needed (yeast in skin folds).    . pantoprazole (PROTONIX) 40 MG tablet Take 1 tablet (40 mg total) by mouth 2 (two) times daily.   . pentosan polysulfate (ELMIRON) 100 MG capsule Take 100 mg by mouth 3 (three) times daily.   . promethazine (PHENERGAN) 25 MG tablet Take 25 mg by mouth 2 (two) times daily as needed for nausea.   . SUMAtriptan (IMITREX) 20 MG/ACT nasal spray Place 20 mg into the nose every 2 (two) hours as needed for migraine or headache. May repeat in 2 hours if headache persists or recurs.   .Marland KitchentiZANidine (ZANAFLEX) 4 MG tablet Take 4 mg by mouth 3 (three) times daily. May hold one dose if needed to operate motor vehicle.    No facility-administered encounter medications on file as of 09/27/2020.  Functional Status:  In your present state of health, do you have any difficulty performing the following activities: 07/06/2020 03/30/2020  Hearing? N N  Vision? N N  Difficulty concentrating or making decisions? Y Y  Comment short term memory loss recent stroke  Walking or climbing stairs? [name]  Comment Stroke history recent stroke  Dressing or bathing? N N  Doing errands, shopping? Y Y  Comment daugher and mother assist daughter assists  Conservation officer, nature and eating ? [name]  Comment family helps family helps  Using the Toilet? N N  In the past six months, have you accidently leaked urine? N N  Do you have problems with  loss of bowel control? N N  Managing your Medications? [name]  Comment daughter Helps daughter [name] Helps  Managing your Finances? [name]  Comment daughter helps daughter [name] helps  Housekeeping or managing your Housekeeping? [name]  Comment daughter helps daughter [name] Helps  Some recent data might be hidden    Fall/Depression Screening: Fall Risk  07/06/2020 03/30/2020 09/27/2019  Falls in the past year? 1 1 0  Comment - - -  Number falls in past yr: 1 1 -  Injury with Fall? 0 0 -  Risk Factor Category  - - -  Risk for fall due to : History of fall(s) History of fall(s) -  Follow up Falls prevention discussed Falls prevention discussed -   PHQ 2/9 Scores 07/06/2020 06/08/2020 03/30/2020 09/27/2019 07/31/2017 07/03/2017 05/18/2017  PHQ - 2 Score 0 0 1 1 1 1 1   PHQ- 9 Score - - - - - - -    Assessment:  Goals Addressed            This Visit's Progress   . Make and Keep All Appointments       Timeframe:  Long-Range Goal Priority:  High Start Date:  07/06/20                           Expected End Date:  03/27/21                  Follow up: 11/24/20   - ask family or friend for a ride - call to cancel if needed    Why is this important?    Part of staying healthy is seeing the doctor for follow-up care.   If you forget your appointments, there are some things you can do to stay on track.    Notes: Patient sees physicians regularly.  3/3/322 saw PCP about 3 weeks ago.    . Prevent Falls and Injury       Timeframe:  Short-Term Goal Priority:  High Start Date:    09/27/20                         Expected End Date:    12/25/20                   Follow Up Date 11/24/20   - always wear low-heeled or flat shoes or slippers with nonskid soles - use a cane or walker    Why is this important?    Most falls happen when it is hard for you to walk safely. Your balance may be off because of an illness. You may have pain in your knees, hip or other joints.   You may be overly tired  or taking medicines  that make you sleepy. You may not be able to see or hear clearly.   Falls can lead to broken bones, bruises or other injuries.   There are things you can do to help prevent falling.     Notes: Uses walker if necessary for prevention of falls.      . THN-Monitor and Manage My Blood Sugar       Timeframe:  Long-Range Goal Priority:  High Start Date:  07/06/20                            Expected End Date: 03/27/21  Follow up: 11/24/20   - check blood sugar at prescribed times - check blood sugar if I feel it is too high or too low - take the blood sugar log to all doctor visits    Why is this important?   Checking your blood sugar at home helps to keep it from getting very high or very low.  Writing the results in a diary or log helps the doctor know how to care for you.  Your blood sugar log should have the time, date and the results.  Also, write down the amount of insulin or other medicine that you take.  Other information, like what you ate, exercise done and how you were feeling, will also be helpful.     Notes: Patient blood sugars lower with new insulin dosing.  Last check 132. 05/22/20 continue monitoring sugars and limiting carbohydrates.  08/30/20 Patient reports sugar are better with [facility]. 09/27/20 patient reports sugars are up and down with highest being in the 300's. Eat regular meals.        Plan: RN CM will contact patient again in the month of April. Follow-up:  Patient agrees to Care Plan and Follow-up.   [name], RN, MSN [place] Management Care Management Coordinator Direct Line [phone] Cell [phone] Toll Free: [phone]  Fax: [phone]

## 2020-10-04 DIAGNOSIS — E1165 Type 2 diabetes mellitus with hyperglycemia: Secondary | ICD-10-CM | POA: Diagnosis not present

## 2020-10-15 ENCOUNTER — Other Ambulatory Visit: Payer: Self-pay | Admitting: Internal Medicine

## 2020-10-15 ENCOUNTER — Other Ambulatory Visit: Payer: Self-pay | Admitting: Gastroenterology

## 2020-10-20 LAB — CUP PACEART REMOTE DEVICE CHECK
Date Time Interrogation Session: 20220318141754
Implantable Pulse Generator Implant Date: 20210831

## 2020-10-21 ENCOUNTER — Other Ambulatory Visit: Payer: Self-pay | Admitting: Gastroenterology

## 2020-10-22 ENCOUNTER — Ambulatory Visit (INDEPENDENT_AMBULATORY_CARE_PROVIDER_SITE_OTHER): Payer: Medicare HMO

## 2020-10-22 DIAGNOSIS — I639 Cerebral infarction, unspecified: Secondary | ICD-10-CM

## 2020-10-25 ENCOUNTER — Telehealth: Payer: Self-pay | Admitting: Neurology

## 2020-10-25 NOTE — Telephone Encounter (Signed)
Pt called stating that she has been having a lot of headaches and is wanting to know if this has to do with her stroke. Please advise.

## 2020-10-25 NOTE — Telephone Encounter (Signed)
Returned patient's call.  Patient doesn't want to make an office visit, stated symptoms are unchanged, still having trouble finding words.  She did have a fall.  Does not want any PT/OT. She has already had that and they said they can't do anymore for her.  Stated that she having the same symptoms she has had since the stroke, her headache is just like it was when she came in to be seen by the provider and goes away when she takes tylenol for it.   Discussed stroke symptoms with patient and when the call EMS.  Patient verbalized understanding and appreciation for the call.

## 2020-10-31 DIAGNOSIS — R296 Repeated falls: Secondary | ICD-10-CM | POA: Diagnosis not present

## 2020-10-31 DIAGNOSIS — I10 Essential (primary) hypertension: Secondary | ICD-10-CM | POA: Diagnosis not present

## 2020-10-31 DIAGNOSIS — J019 Acute sinusitis, unspecified: Secondary | ICD-10-CM | POA: Diagnosis not present

## 2020-10-31 DIAGNOSIS — Z8673 Personal history of transient ischemic attack (TIA), and cerebral infarction without residual deficits: Secondary | ICD-10-CM | POA: Diagnosis not present

## 2020-10-31 DIAGNOSIS — R2990 Unspecified symptoms and signs involving the nervous system: Secondary | ICD-10-CM | POA: Diagnosis not present

## 2020-10-31 NOTE — Progress Notes (Signed)
Carelink Summary Report / Loop Recorder 

## 2020-11-01 ENCOUNTER — Other Ambulatory Visit: Payer: Self-pay

## 2020-11-01 NOTE — Patient Outreach (Signed)
[place] [facility]) Care Management  11/01/2020  Edwardine Deschepper Percival 10-29-1965 [id number]   Telephone call to patient for follow up disease management. No answer. Unable to leave a message.    Plan: RN CM will attempt patient again in June.    [name], RN, MSN [place] Management Care Management Coordinator Direct Line [phone] Cell [phone] Toll Free: [phone]  Fax: [phone]

## 2020-11-07 DIAGNOSIS — E1165 Type 2 diabetes mellitus with hyperglycemia: Secondary | ICD-10-CM | POA: Diagnosis not present

## 2020-11-26 ENCOUNTER — Ambulatory Visit (INDEPENDENT_AMBULATORY_CARE_PROVIDER_SITE_OTHER): Payer: Medicare HMO

## 2020-11-26 DIAGNOSIS — I63512 Cerebral infarction due to unspecified occlusion or stenosis of left middle cerebral artery: Secondary | ICD-10-CM | POA: Diagnosis not present

## 2020-11-26 LAB — CUP PACEART REMOTE DEVICE CHECK
Date Time Interrogation Session: 20220430230216
Implantable Pulse Generator Implant Date: 20210831

## 2020-11-28 DIAGNOSIS — Z01419 Encounter for gynecological examination (general) (routine) without abnormal findings: Secondary | ICD-10-CM | POA: Diagnosis not present

## 2020-11-28 DIAGNOSIS — Z6836 Body mass index (BMI) 36.0-36.9, adult: Secondary | ICD-10-CM | POA: Diagnosis not present

## 2020-11-28 DIAGNOSIS — N760 Acute vaginitis: Secondary | ICD-10-CM | POA: Diagnosis not present

## 2020-12-05 ENCOUNTER — Other Ambulatory Visit: Payer: Self-pay | Admitting: Cardiovascular Disease

## 2020-12-05 DIAGNOSIS — Z1231 Encounter for screening mammogram for malignant neoplasm of breast: Secondary | ICD-10-CM | POA: Diagnosis not present

## 2020-12-06 ENCOUNTER — Telehealth: Payer: Self-pay | Admitting: Internal Medicine

## 2020-12-06 MED ORDER — TRELEGY ELLIPTA 100-62.5-25 MCG/INH IN AEPB: 1.0000 | INHALATION_SPRAY | Freq: Every day | RESPIRATORY_TRACT | 0 refills | Status: DC

## 2020-12-06 NOTE — Telephone Encounter (Signed)
Called and spoke with patient. She was calling to get a refill on her Trelegy to be sent to [facility]. I advised her I would send this in for her.  She verbalized understanding.   Nothing further needed at time of call.

## 2020-12-10 DIAGNOSIS — E1165 Type 2 diabetes mellitus with hyperglycemia: Secondary | ICD-10-CM | POA: Diagnosis not present

## 2020-12-14 NOTE — Progress Notes (Signed)
Carelink Summary Report / Loop Recorder 

## 2020-12-25 DIAGNOSIS — I10 Essential (primary) hypertension: Secondary | ICD-10-CM | POA: Diagnosis not present

## 2020-12-25 DIAGNOSIS — J440 Chronic obstructive pulmonary disease with acute lower respiratory infection: Secondary | ICD-10-CM | POA: Diagnosis not present

## 2020-12-25 DIAGNOSIS — E1143 Type 2 diabetes mellitus with diabetic autonomic (poly)neuropathy: Secondary | ICD-10-CM | POA: Diagnosis not present

## 2020-12-25 DIAGNOSIS — E039 Hypothyroidism, unspecified: Secondary | ICD-10-CM | POA: Diagnosis not present

## 2020-12-25 DIAGNOSIS — G43009 Migraine without aura, not intractable, without status migrainosus: Secondary | ICD-10-CM | POA: Diagnosis not present

## 2020-12-25 DIAGNOSIS — J45909 Unspecified asthma, uncomplicated: Secondary | ICD-10-CM | POA: Diagnosis not present

## 2020-12-25 DIAGNOSIS — K219 Gastro-esophageal reflux disease without esophagitis: Secondary | ICD-10-CM | POA: Diagnosis not present

## 2020-12-25 DIAGNOSIS — E782 Mixed hyperlipidemia: Secondary | ICD-10-CM | POA: Diagnosis not present

## 2020-12-25 DIAGNOSIS — J4530 Mild persistent asthma, uncomplicated: Secondary | ICD-10-CM | POA: Diagnosis not present

## 2020-12-30 LAB — CUP PACEART REMOTE DEVICE CHECK
Date Time Interrogation Session: 20220602230711
Implantable Pulse Generator Implant Date: 20210831

## 2020-12-31 ENCOUNTER — Ambulatory Visit (INDEPENDENT_AMBULATORY_CARE_PROVIDER_SITE_OTHER): Payer: Medicare HMO

## 2020-12-31 DIAGNOSIS — I63512 Cerebral infarction due to unspecified occlusion or stenosis of left middle cerebral artery: Secondary | ICD-10-CM

## 2021-01-03 ENCOUNTER — Other Ambulatory Visit: Payer: Self-pay

## 2021-01-03 DIAGNOSIS — E782 Mixed hyperlipidemia: Secondary | ICD-10-CM | POA: Diagnosis not present

## 2021-01-03 DIAGNOSIS — J440 Chronic obstructive pulmonary disease with acute lower respiratory infection: Secondary | ICD-10-CM | POA: Diagnosis not present

## 2021-01-03 DIAGNOSIS — Z794 Long term (current) use of insulin: Secondary | ICD-10-CM | POA: Diagnosis not present

## 2021-01-03 DIAGNOSIS — E039 Hypothyroidism, unspecified: Secondary | ICD-10-CM | POA: Diagnosis not present

## 2021-01-03 DIAGNOSIS — K219 Gastro-esophageal reflux disease without esophagitis: Secondary | ICD-10-CM | POA: Diagnosis not present

## 2021-01-03 DIAGNOSIS — E1143 Type 2 diabetes mellitus with diabetic autonomic (poly)neuropathy: Secondary | ICD-10-CM | POA: Diagnosis not present

## 2021-01-03 DIAGNOSIS — J45909 Unspecified asthma, uncomplicated: Secondary | ICD-10-CM | POA: Diagnosis not present

## 2021-01-03 DIAGNOSIS — I10 Essential (primary) hypertension: Secondary | ICD-10-CM | POA: Diagnosis not present

## 2021-01-03 DIAGNOSIS — E1165 Type 2 diabetes mellitus with hyperglycemia: Secondary | ICD-10-CM | POA: Diagnosis not present

## 2021-01-03 DIAGNOSIS — E1142 Type 2 diabetes mellitus with diabetic polyneuropathy: Secondary | ICD-10-CM | POA: Diagnosis not present

## 2021-01-03 NOTE — Patient Outreach (Signed)
[place] [facility]) Care Management  01/03/2021  Cheryl [name] 09/09/1965 [id number]   Telephone call to patient for disease management follow up.   No answer.  HIPAA compliant voice message left.    Plan: If no return call, RN CM will attempt patient again in the month of August.    Amabel Stmarie J Chukwuma Straus, RN, MSN [place] Management Care Management Coordinator Direct Line [phone] Cell [phone] Toll Free: [phone]  Fax: [phone]

## 2021-01-10 DIAGNOSIS — E1165 Type 2 diabetes mellitus with hyperglycemia: Secondary | ICD-10-CM | POA: Diagnosis not present

## 2021-01-18 ENCOUNTER — Ambulatory Visit: Payer: Medicare HMO | Admitting: Internal Medicine

## 2021-01-21 ENCOUNTER — Institutional Professional Consult (permissible substitution): Payer: Medicare HMO | Admitting: Neurology

## 2021-01-21 NOTE — Progress Notes (Signed)
Carelink Summary Report / Loop Recorder 

## 2021-02-01 DIAGNOSIS — R945 Abnormal results of liver function studies: Secondary | ICD-10-CM | POA: Diagnosis not present

## 2021-02-01 DIAGNOSIS — I951 Orthostatic hypotension: Secondary | ICD-10-CM | POA: Diagnosis not present

## 2021-02-01 DIAGNOSIS — E1142 Type 2 diabetes mellitus with diabetic polyneuropathy: Secondary | ICD-10-CM | POA: Diagnosis not present

## 2021-02-01 DIAGNOSIS — Z7984 Long term (current) use of oral hypoglycemic drugs: Secondary | ICD-10-CM | POA: Diagnosis not present

## 2021-02-01 DIAGNOSIS — J849 Interstitial pulmonary disease, unspecified: Secondary | ICD-10-CM | POA: Diagnosis not present

## 2021-02-03 LAB — CUP PACEART REMOTE DEVICE CHECK
Date Time Interrogation Session: 20220705230728
Implantable Pulse Generator Implant Date: 20210831

## 2021-02-04 ENCOUNTER — Ambulatory Visit (INDEPENDENT_AMBULATORY_CARE_PROVIDER_SITE_OTHER): Payer: Medicare HMO

## 2021-02-04 DIAGNOSIS — I6300 Cerebral infarction due to thrombosis of unspecified precerebral artery: Secondary | ICD-10-CM

## 2021-02-05 DIAGNOSIS — G43009 Migraine without aura, not intractable, without status migrainosus: Secondary | ICD-10-CM | POA: Diagnosis not present

## 2021-02-05 DIAGNOSIS — E1142 Type 2 diabetes mellitus with diabetic polyneuropathy: Secondary | ICD-10-CM | POA: Diagnosis not present

## 2021-02-05 DIAGNOSIS — E039 Hypothyroidism, unspecified: Secondary | ICD-10-CM | POA: Diagnosis not present

## 2021-02-05 DIAGNOSIS — E782 Mixed hyperlipidemia: Secondary | ICD-10-CM | POA: Diagnosis not present

## 2021-02-05 DIAGNOSIS — E1165 Type 2 diabetes mellitus with hyperglycemia: Secondary | ICD-10-CM | POA: Diagnosis not present

## 2021-02-05 DIAGNOSIS — E1143 Type 2 diabetes mellitus with diabetic autonomic (poly)neuropathy: Secondary | ICD-10-CM | POA: Diagnosis not present

## 2021-02-05 DIAGNOSIS — K219 Gastro-esophageal reflux disease without esophagitis: Secondary | ICD-10-CM | POA: Diagnosis not present

## 2021-02-05 DIAGNOSIS — J440 Chronic obstructive pulmonary disease with acute lower respiratory infection: Secondary | ICD-10-CM | POA: Diagnosis not present

## 2021-02-05 DIAGNOSIS — I10 Essential (primary) hypertension: Secondary | ICD-10-CM | POA: Diagnosis not present

## 2021-02-09 ENCOUNTER — Other Ambulatory Visit: Payer: Self-pay | Admitting: Neurology

## 2021-02-09 ENCOUNTER — Other Ambulatory Visit: Payer: Self-pay | Admitting: Internal Medicine

## 2021-02-09 ENCOUNTER — Other Ambulatory Visit: Payer: Self-pay | Admitting: Cardiovascular Disease

## 2021-02-11 DIAGNOSIS — R0981 Nasal congestion: Secondary | ICD-10-CM | POA: Diagnosis not present

## 2021-02-11 DIAGNOSIS — R059 Cough, unspecified: Secondary | ICD-10-CM | POA: Diagnosis not present

## 2021-02-11 DIAGNOSIS — R519 Headache, unspecified: Secondary | ICD-10-CM | POA: Diagnosis not present

## 2021-02-11 DIAGNOSIS — J029 Acute pharyngitis, unspecified: Secondary | ICD-10-CM | POA: Diagnosis not present

## 2021-02-11 DIAGNOSIS — R109 Unspecified abdominal pain: Secondary | ICD-10-CM | POA: Diagnosis not present

## 2021-02-11 DIAGNOSIS — E1165 Type 2 diabetes mellitus with hyperglycemia: Secondary | ICD-10-CM | POA: Diagnosis not present

## 2021-02-12 DIAGNOSIS — J069 Acute upper respiratory infection, unspecified: Secondary | ICD-10-CM | POA: Diagnosis not present

## 2021-02-25 NOTE — Progress Notes (Signed)
Carelink Summary Report / Loop Recorder 

## 2021-03-05 NOTE — Progress Notes (Signed)
Subjective:    Patient ID: Cheryl [name], female    DOB: 10-13-1965, 55 y.o.   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 HST 12/16/16- AHI 6.2/ hr, desaturation to 84%, body weight 213 lbs. PFT 07/18/15-WNL-FVC 3.03/88%, FEV1 2.63/95%, ratio 0.87, TLC 105%, DLCO 113% Echocardiogram 02/12/15-EF 60-65 percent, grade 1 diastolic dysfunction Video bronchoscopy 06/28/2019- for evaluation of ground glass and tiny nodules, with concern of possible eosinophilic pneumonia, nonspecific with histiocytes noted- no malignancy or eosinophils. 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 Complicated by R PTX She complains of chronic cough at least since September. Neb helps. Not using Singulair- says "had problems with it before".  Denies rash or adenopathy.   Admits reflux awareness. Not using CPAP currently. Sleeps propped up. Has gastropoesis, working with GI. Burps up "rotten eggs". Lab 06/30/2019- Hypersensitivity pneumonia-Neg, ESR 4, ILD panel- NEG, IgE 373 H,  EOS 15.4 H 06/06/2019 CT  chest 06/07/2019-  IMPRESSION: 1. Mild central bronchial wall thickening, can be seen with bronchitis or reactive airways disease. 2. Small subpleural ground-glass opacities in the upper lobes is nonspecific. Findings may be post infectious or inflammatory. Alternatively, interstitial lung disease or chronic eosinophilic pneumonia could have a similar appearance. This could be reassessed at time of nodule follow-up, see below. 3. Tiny (less than 4 mm) right and left upper lobe pulmonary nodules, not definitively seen on prior exam. No follow-up needed if patient is low-risk. Non-contrast chest CT can be considered in 12 months if patient is high-risk. This recommendation follows the consensus statement: Guidelines for Management of Incidental Pulmonary Nodules Detected on CT Images: From the Fleischner Society 2017; Radiology 2017; 284:228-243. 4. Coronary artery calcifications. 5. Mild splenomegaly in the upper abdomen is partially included.  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 Patient stated she has been wheezing for a few weeks Blames pollen, and rainy/ moldy weather and her mobile home. Just finished Zpak last week w/o effect. No fever. Yellow phlegm.  Mask "aggravating". Says tessalon and prometh codeine don't help.   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  Cough now almost gone. CVA last summer.  No longer has oxygen. Stopped using CPAP and O2.  She says she kept waking up panicking and stopped using it.  AHI in 2018 was only 6.2. Her breathing today is at baseline. CXR 10/24/19- FINDINGS: The heart size and mediastinal contours are within normal limits. No focal airspace consolidation, pleural effusion, or pneumothorax. Lower cervical ACDF hardware. No acute osseous findings. IMPRESSION: No active cardiopulmonary disease.    Review of Systems- see HPI   + = positive Constitutional:   No-   weight loss, night sweats, fevers, chills,+irregular sleep schedule, +fatigue, lassitude. HEENT:   No-headaches, difficulty swallowing, tooth/dental problems, +sore throat,       No-  sneezing, itching, +ear ache, nasal congestion, post nasal drip,  CV:  chest pain, orthopnea, PND, swelling in lower extremities, anasarca,  dizziness, +palpitations Resp: + shortness of breath with exertion or at rest.              No- productive cough,  + non-productive cough,  No- coughing up of blood.              No- change in color of mucus.   Skin: No-   rash or lesions. GI:  No-   heartburn, indigestion, abdominal pain, nausea, vomiting,  GU:  MS:  No-   joint pain or swelling.   Neuro-    +tremor Psych:  No- change in mood or affect. + depression or anxiety.  No memory loss.  Objective:   Physical Exam      OBJ- Physical Exam General- Alert, Oriented,  Distress- none acute. + overweight Skin- rash-none, lesions- none, excoriation- none Lymphadenopathy- none Head- atraumatic            Eyes- Gross vision intact, PERRLA, conjunctivae and secretions clear            Ears- Hearing, canals-normal            Nose-  turbinate edema-none, no-Septal dev, mucus, polyps, erosion, perforation             Throat-  Mallampati II , mucosa clear , drainage- none, tonsils- atrophic,  Neck- flexible , trachea midline, no stridor , thyroid nl, carotid no bruit Chest - symmetrical excursion , unlabored           Heart/CV- RRR , no murmur , no gallop  , no rub, nl s1 s2                           - JVD- none , edema- none, stasis changes- none, varices- none           Lung-  Wheeze-none, cough-none , dullness-none, rub- none           Chest wall-  Abd-  Br/ Gen/ Rectal- Not done, not indicated Extrem- cyanosis- none, clubbing, none, atrophy- none, strength- nl Neuro- +tremor

## 2021-03-06 ENCOUNTER — Other Ambulatory Visit: Payer: Self-pay

## 2021-03-06 ENCOUNTER — Encounter: Payer: Self-pay | Admitting: Internal Medicine

## 2021-03-06 ENCOUNTER — Ambulatory Visit: Payer: Medicare HMO | Admitting: Internal Medicine

## 2021-03-06 DIAGNOSIS — G4733 Obstructive sleep apnea (adult) (pediatric): Secondary | ICD-10-CM

## 2021-03-06 DIAGNOSIS — J45909 Unspecified asthma, uncomplicated: Secondary | ICD-10-CM | POA: Diagnosis not present

## 2021-03-06 NOTE — Patient Instructions (Signed)
Ok not to use CPAP  We will continue your breathing medicines- please call if we can help or if you need refills.

## 2021-03-11 ENCOUNTER — Ambulatory Visit (INDEPENDENT_AMBULATORY_CARE_PROVIDER_SITE_OTHER): Payer: Medicare HMO

## 2021-03-11 DIAGNOSIS — I6300 Cerebral infarction due to thrombosis of unspecified precerebral artery: Secondary | ICD-10-CM | POA: Diagnosis not present

## 2021-03-11 LAB — CUP PACEART REMOTE DEVICE CHECK
Date Time Interrogation Session: 20220807230806
Implantable Pulse Generator Implant Date: 20210831

## 2021-03-12 ENCOUNTER — Other Ambulatory Visit: Payer: Self-pay

## 2021-03-12 NOTE — Patient Instructions (Signed)
Goals Addressed               This Visit's Progress     COMPLETED: Make and Keep All Appointments   On track     Timeframe:  Long-Range Goal Priority:  High Start Date:  07/06/20                           Expected End Date:  03/27/21                  Follow up: 11/24/20   - ask family or friend for a ride - call to cancel if needed    Why is this important?   Part of staying healthy is seeing the doctor for follow-up care.  If you forget your appointments, there are some things you can do to stay on track.    Notes: Patient sees physicians regularly.  3/3/322 saw PCP about 3 weeks ago. 03/12/21 No problems with appointments      COMPLETED: Prevent Falls and Injury   On track     Timeframe:  Short-Term Goal Priority:  High Start Date:    09/27/20                         Expected End Date:    12/25/20                   Follow Up Date 11/24/20   - always wear low-heeled or flat shoes or slippers with nonskid soles - use a cane or walker    Why is this important?   Most falls happen when it is hard for you to walk safely. Your balance may be off because of an illness. You may have pain in your knees, hip or other joints.  You may be overly tired or taking medicines that make you sleepy. You may not be able to see or hear clearly.  Falls can lead to broken bones, bruises or other injuries.  There are things you can do to help prevent falling.     Notes: Uses walker if necessary for prevention of falls.   03/12/21 No falls      THN-Monitor and Manage My Blood Sugar (pt-stated)   On track     Timeframe:  Long-Range Goal Priority:  High Start Date:  07/06/20                            Expected End Date: 03/27/21  Follow up: 11/24/20   - check blood sugar at prescribed times - check blood sugar if I feel it is too high or too low - take the blood sugar log to all doctor visits    Why is this important?   Checking your blood sugar at home helps to keep it from getting very  high or very low.  Writing the results in a diary or log helps the doctor know how to care for you.  Your blood sugar log should have the time, date and the results.  Also, write down the amount of insulin or other medicine that you take.  Other information, like what you ate, exercise done and how you were feeling, will also be helpful.     Notes: Patient blood sugars lower with new insulin dosing.  Last check 132. 05/22/20 continue monitoring sugars and limiting carbohydrates.  08/30/20 Patient reports sugar are  better with Elenor Legato. 09/27/20 patient reports sugars are up and down with highest being in the 300's. Eat regular meals.  03/12/21 Patient sugars in 200-300 range. Patient continues to word find but doing good.   Diabetes Management Discussed: Medication adherence Reviewed importance of limiting carbs such as rice, potatoes, breads, and pastas. Also discussed limiting sweets and sugary drinks.  Discussed importance of portion control.  Also discussed importance of annual exams, foot exams, and eye exams.

## 2021-03-12 NOTE — Patient Outreach (Signed)
[facility]) Care Management  [place]  03/12/2021   Cheryl [name] April 18, 1966 [id number]  Subjective: Telephone call to patient for disease management A1c up.  Discussed diabetes management.  No concerns.    Objective:   Encounter Medications:  Outpatient Encounter Medications as of 03/12/2021  Medication Sig Note   albuterol (VENTOLIN HFA) 108 (90 Base) MCG/ACT inhaler Inhale 2 puffs into the lungs every 6 (six) hours as needed for wheezing or shortness of breath. Appointment needed before future refills.    atorvastatin (LIPITOR) 80 MG tablet Take 1 tablet (80 mg total) by mouth daily.    Cholecalciferol (VITAMIN D3) 1000 units CAPS Take 1,000 Units by mouth daily.    clopidogrel (PLAVIX) 75 MG tablet TAKE 1 TABLET (75 MG TOTAL) BY MOUTH DAILY.    diazepam (VALIUM) 10 MG tablet Take 10 mg by mouth 2 (two) times daily as needed (panic attacks.).     diltiazem (CARDIZEM CD) 240 MG 24 hr capsule TAKE 1 CAPSULE BY MOUTH EVERY MORNING ON AN EMPTY STOMACH - [name] DENIED, FAXED DR Johnsie Cancel 10/22/15 SS (Patient taking differently: Take 240 mg by mouth daily.)    FLUoxetine (PROZAC) 40 MG capsule Take 80 mg by mouth at bedtime.     Fluticasone-Umeclidin-Vilant (TRELEGY ELLIPTA) 100-62.5-25 MCG/INH AEPB Inhale 1 puff into the lungs daily.    HUMULIN R U-500 KWIKPEN 500 UNIT/ML kwikpen Inject 100 Units into the skin daily.  04/24/2020: Taking 130 units am and 30 units in the PM   ibuprofen (ADVIL,MOTRIN) 800 MG tablet Take 800 mg by mouth every 8 (eight) hours as needed (pain).     ipratropium-albuterol (DUONEB) 0.5-2.5 (3) MG/3ML SOLN Take 12m by nebulization every 4-6 hours if needed (Patient taking differently: Inhale 3 mLs into the lungs every 4 (four) hours as needed (wheezing/shortness of breath.).)    levothyroxine (SYNTHROID, LEVOTHROID) 75 MCG tablet Take 75 mcg by mouth daily before breakfast.    liraglutide (VICTOZA) 18 MG/3ML SOPN Inject 1.2 mg into the  skin every evening.     losartan (COZAAR) 25 MG tablet Take 25 mg by mouth daily.    metoCLOPramide (REGLAN) 10 MG tablet Take 10 mg by mouth 3 (three) times daily.    nebivolol (BYSTOLIC) 10 MG tablet Take 1 tablet (10 mg total) by mouth daily. Please call and schedule a one year follow up appointment for further refills    nystatin-triamcinolone (MYCOLOG II) cream Apply 1 application topically 2 (two) times daily as needed (yeast in skin folds).     pantoprazole (PROTONIX) 40 MG tablet TAKE 1 TABLET TWICE DAILY    pentosan polysulfate (ELMIRON) 100 MG capsule Take 100 mg by mouth 3 (three) times daily.    promethazine (PHENERGAN) 25 MG tablet Take 25 mg by mouth 2 (two) times daily as needed for nausea.    SUMAtriptan (IMITREX) 20 MG/ACT nasal spray Place 20 mg into the nose every 2 (two) hours as needed for migraine or headache. May repeat in 2 hours if headache persists or recurs.    tiZANidine (ZANAFLEX) 4 MG tablet Take 4 mg by mouth 3 (three) times daily. May hold one dose if needed to operate motor vehicle.    No facility-administered encounter medications on file as of 03/12/2021.    Functional Status:  In your present state of health, do you have any difficulty performing the following activities: 03/12/2021 07/06/2020  Hearing? N N  Vision? N N  Difficulty concentrating or making decisions? YTempie [name]  Comment short term memory loss short term memory loss  Walking or climbing stairs? Cheryl [name]  Comment Stroke history Stroke history  Dressing or bathing? N N  Doing errands, shopping? Y Y  Comment daugher and mother assist daugher and mother [place] and eating ? Cheryl [name]  Comment family helps family helps  Using the Toilet? N N  In the past six months, have you accidently leaked urine? N N  Do you have problems with loss of bowel control? N N  Managing your Medications? Cheryl [name]  Comment daughter helps daughter Helps  Managing your Finances? Cheryl [name]  Comment daughter helps daughter helps   Housekeeping or managing your Housekeeping? Cheryl [name]  Comment daughter helps daughter helps  Some recent data might be hidden    Fall/Depression Screening: Fall Risk  09/27/2020 07/06/2020 03/30/2020  Falls in the past year? 1 1 1   Comment - - -  Number falls in past yr: 1 1 1   Injury with Fall? 1 0 0  Risk Factor Category  - - -  Risk for fall due to : Impaired balance/gait;History of fall(s) History of fall(s) History of fall(s)  Follow up Falls prevention discussed;Education provided Falls prevention discussed Falls prevention discussed   PHQ 2/9 Scores 03/12/2021 07/06/2020 06/08/2020 03/30/2020 09/27/2019 07/31/2017 07/03/2017  PHQ - 2 Score 1 0 0 1 1 1 1   PHQ- 9 Score - - - - - - -    Assessment:   Care Plan Care Plan : Diabetes Type 2 (Adult)  Updates made by Jon Billings, RN since 03/12/2021 12:00 AM     Problem: Disease Progression (Diabetes, Type 2)      Long-Range Goal: Disease Progression Prevented or Minimized as evidenced by patient reporting sugars less than 300.   Start Date: 05/07/2020  Expected End Date: 07/27/2021  This Visit's Progress: On track  Recent Progress: On track  Priority: High  Note:   Evidence-based guidance:  Prepare patient for laboratory and diagnostic exams based on risk and presentation.  Encourage lifestyle changes, such as increased intake of plant-based foods, stress reduction, consistent physical activity and smoking cessation to prevent long-term complications and chronic disease.   Notes:     Task: Monitor and Manage Follow-up for Comorbidities   Priority: Routine  Responsible User: Jon Billings, RN  Note:   Care Management Activities:    - reduction of sedentary activity encouraged - response to pharmacologic therapy monitored - signs/symptoms of comorbidities identified    Notes: Patient managing well post stroke 05/22/20.  Patient has some problems with memory.   Encouraged patient to do puzzles to help with her memory.  09/27/20  patient sugars up and down. Encouraged patient to eat regularly.   03/12/21 Patient sugars in 200-300 range. Patient continues to word find but doing good.   Diabetes Management Discussed: Medication adherence Reviewed importance of limiting carbs such as rice, potatoes, breads, and pastas. Also discussed limiting sweets and sugary drinks.  Discussed importance of portion control.  Also discussed importance of annual exams, foot exams, and eye exams.        Care Plan : Fall Risk (Adult)  Updates made by Jon Billings, RN since 03/12/2021 12:00 AM     Problem: Fall Risk      Goal: Absence of Fall and Fall-Related Injury as evidenced by patient reports no falls. Completed 03/12/2021  Start Date: 09/27/2020  Expected End Date: 12/25/2020  Priority: High  Note:   Evidence-based guidance:  Assess  fall risk using a validated tool when available. Consider balance and gait impairment, muscle weakness, diminished vision or hearing, environmental hazards, presence of urinary or bowel urgency and/or incontinence.  Communicate fall injury risk to interprofessional healthcare team.  Develop a fall prevention plan with the patient and family.  Promote use of personal vision and auditory aids.  Promote reorientation, appropriate sensory stimulation, and routines to decrease risk of fall when changes in mental status are present.  Assess assistance level required for safe and effective self-care; consider referral for home care.  Encourage physical activity, such as performance of self-care at highest level of ability, strength and balance exercise program, and provision of appropriate assistive devices; refer to rehabilitation therapy.  Refer to community-based fall prevention program where available.  If fall occurs, determine the cause and revise fall injury prevention plan.  Regularly review medication contribution to fall risk; consider risk related to polypharmacy and age.  Refer to pharmacist for  consultation when concerns about medications are revealed.  Balance adequate pain management with potential for oversedation.  Provide guidance related to environmental modifications.  Consider supplementation with Vitamin D.   Notes:     Task: Identify and Manage Contributors to Fall Risk Completed 03/12/2021  Due Date: 12/25/2020  Priority: Routine  Responsible User: Jon Billings, RN  Note:   Care Management Activities:    - assistive or adaptive device use encouraged - cognition assessed    Notes:09/27/20 Patient reports a fall about  2-3 weeks ago. Refused ER or urgen care as advised by physician.  Discussed fall prevention.      Goals Addressed               This Visit's Progress     COMPLETED: Make and Keep All Appointments   On track     Timeframe:  Long-Range Goal Priority:  High Start Date:  07/06/20                           Expected End Date:  03/27/21                  Follow up: 11/24/20   - ask family or friend for a ride - call to cancel if needed    Why is this important?   Part of staying healthy is seeing the doctor for follow-up care.  If you forget your appointments, there are some things you can do to stay on track.    Notes: Patient sees physicians regularly.  3/3/322 saw PCP about 3 weeks ago. 03/12/21 No problems with appointments      COMPLETED: Prevent Falls and Injury   On track     Timeframe:  Short-Term Goal Priority:  High Start Date:    09/27/20                         Expected End Date:    12/25/20                   Follow Up Date 11/24/20   - always wear low-heeled or flat shoes or slippers with nonskid soles - use a cane or walker    Why is this important?   Most falls happen when it is hard for you to walk safely. Your balance may be off because of an illness. You may have pain in your knees, hip or other joints.  You may be overly tired or  taking medicines that make you sleepy. You may not be able to see or hear clearly.  Falls can  lead to broken bones, bruises or other injuries.  There are things you can do to help prevent falling.     Notes: Uses walker if necessary for prevention of falls.   03/12/21 No falls      THN-Monitor and Manage My Blood Sugar (pt-stated)   On track     Timeframe:  Long-Range Goal Priority:  High Start Date:  07/06/20                            Expected End Date: 03/27/21  Follow up: 11/24/20   - check blood sugar at prescribed times - check blood sugar if I feel it is too high or too low - take the blood sugar log to all doctor visits    Why is this important?   Checking your blood sugar at home helps to keep it from getting very high or very low.  Writing the results in a diary or log helps the doctor know how to care for you.  Your blood sugar log should have the time, date and the results.  Also, write down the amount of insulin or other medicine that you take.  Other information, like what you ate, exercise done and how you were feeling, will also be helpful.     Notes: Patient blood sugars lower with new insulin dosing.  Last check 132. 05/22/20 continue monitoring sugars and limiting carbohydrates.  08/30/20 Patient reports sugar are better with [facility]. 09/27/20 patient reports sugars are up and down with highest being in the 300's. Eat regular meals.  03/12/21 Patient sugars in 200-300 range. Patient continues to word find but doing good.   Diabetes Management Discussed: Medication adherence Reviewed importance of limiting carbs such as rice, potatoes, breads, and pastas. Also discussed limiting sweets and sugary drinks.  Discussed importance of portion control.  Also discussed importance of annual exams, foot exams, and eye exams.          Plan:  Follow-up: Patient agrees to Care Plan and Follow-up. Follow-up in 2-3 month(s)  [name], RN, MSN [place] [facility] Care Management Coordinator Direct Line [phone] Cell [phone] Toll Free: [phone]  Fax:  [phone]

## 2021-03-13 DIAGNOSIS — E1165 Type 2 diabetes mellitus with hyperglycemia: Secondary | ICD-10-CM | POA: Diagnosis not present

## 2021-03-14 ENCOUNTER — Ambulatory Visit: Payer: Self-pay

## 2021-03-27 IMAGING — MG DIGITAL SCREENING BILAT W/ TOMO W/ CAD
8 of 14 series · 8 of 40 positions shown · non-contrast
Comparison: Previous exam(s).

CLINICAL DATA: Screening.

EXAM:
DIGITAL SCREENING BILATERAL MAMMOGRAM WITH TOMO AND CAD

[L CC synth-2D]
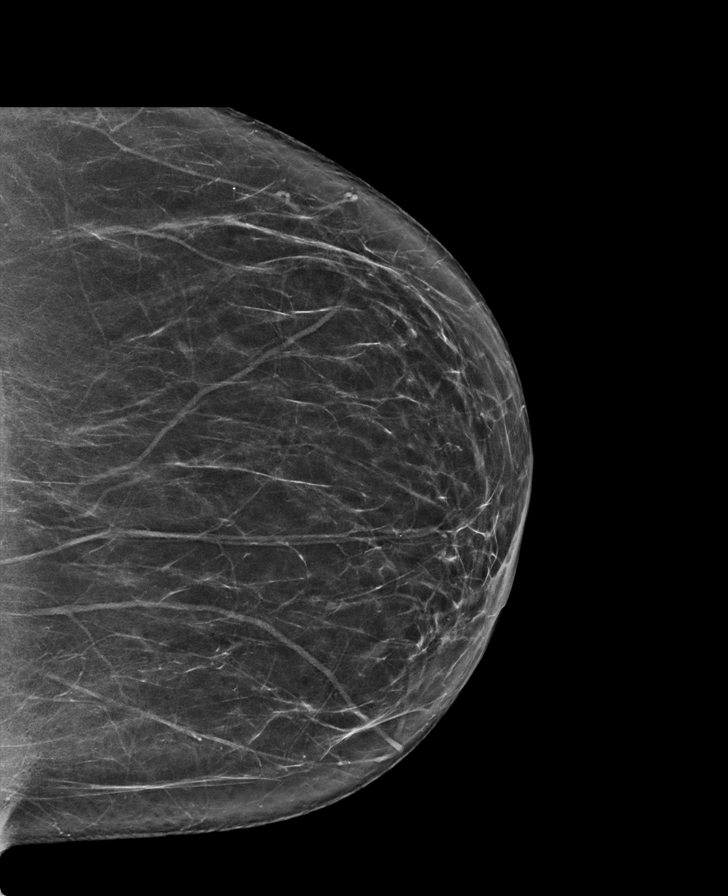

[L MLO synth-2D]
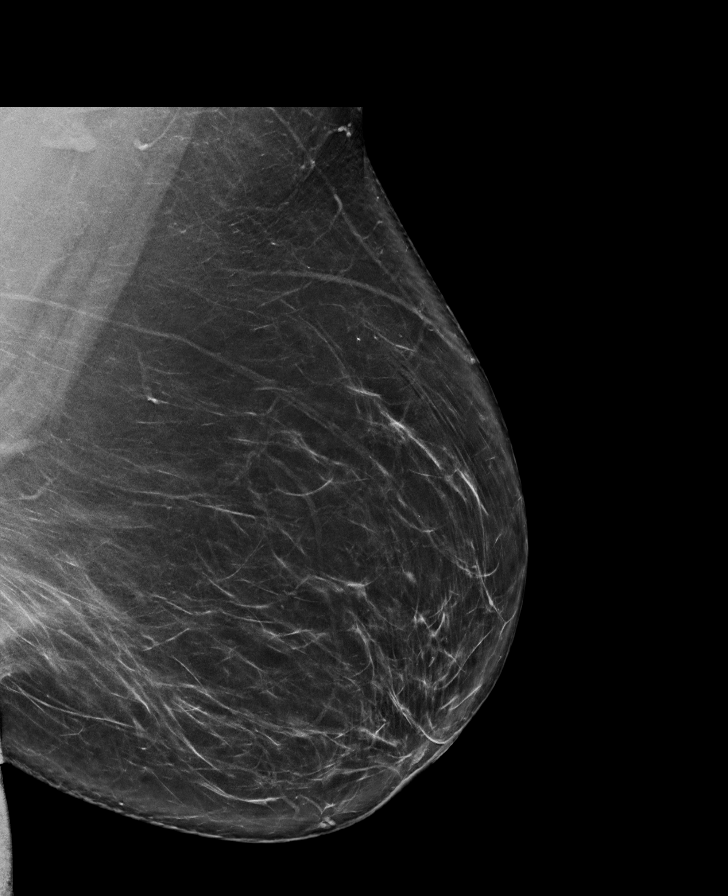

[R MLO synth-2D (1 of 2)]
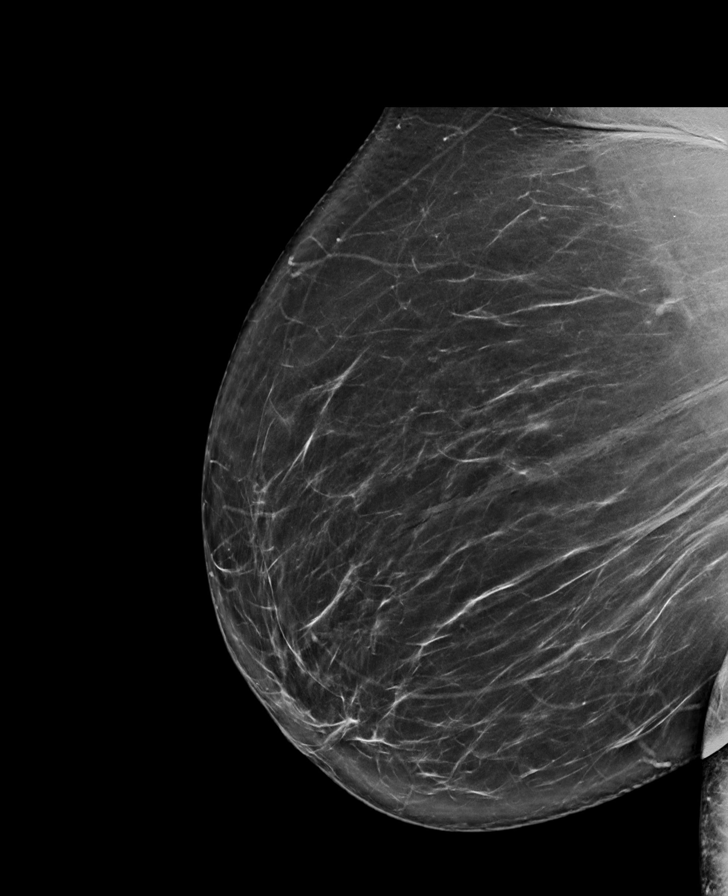

[R CC synth-2D]
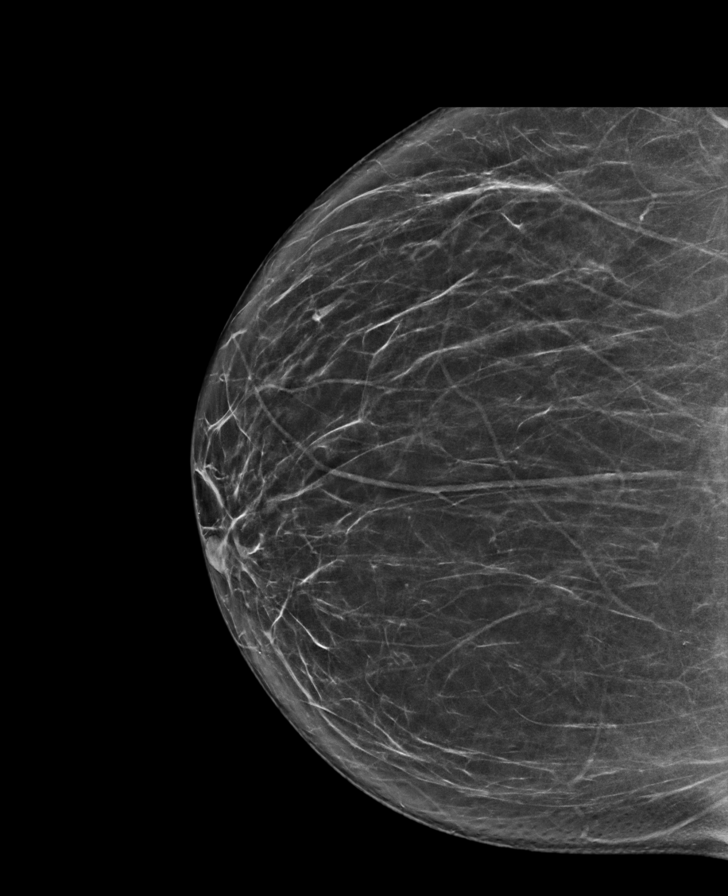

[R XCCL synth-2D]
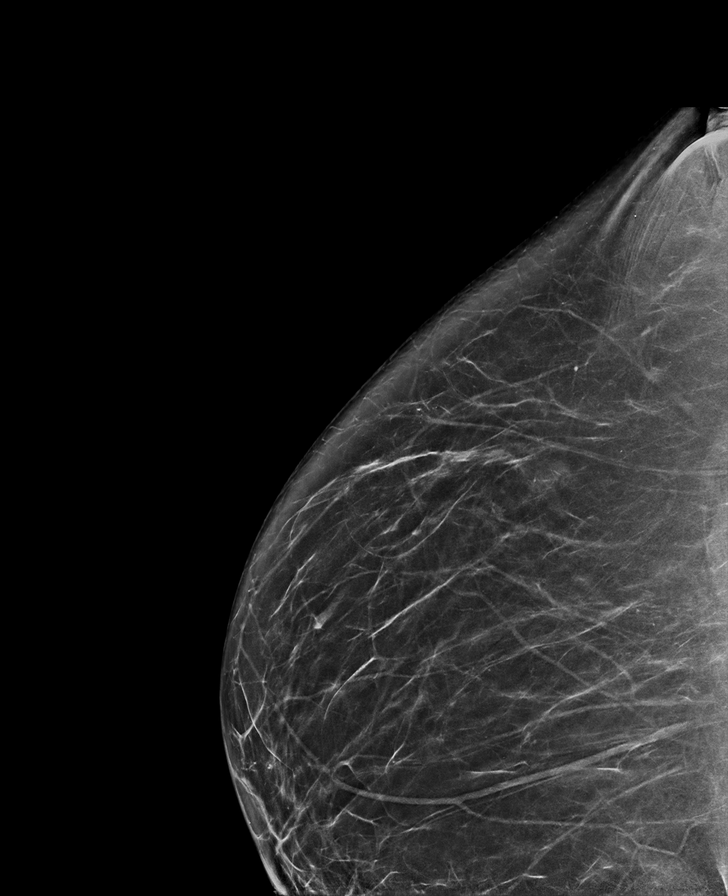

[R MLO synth-2D (2 of 2)]
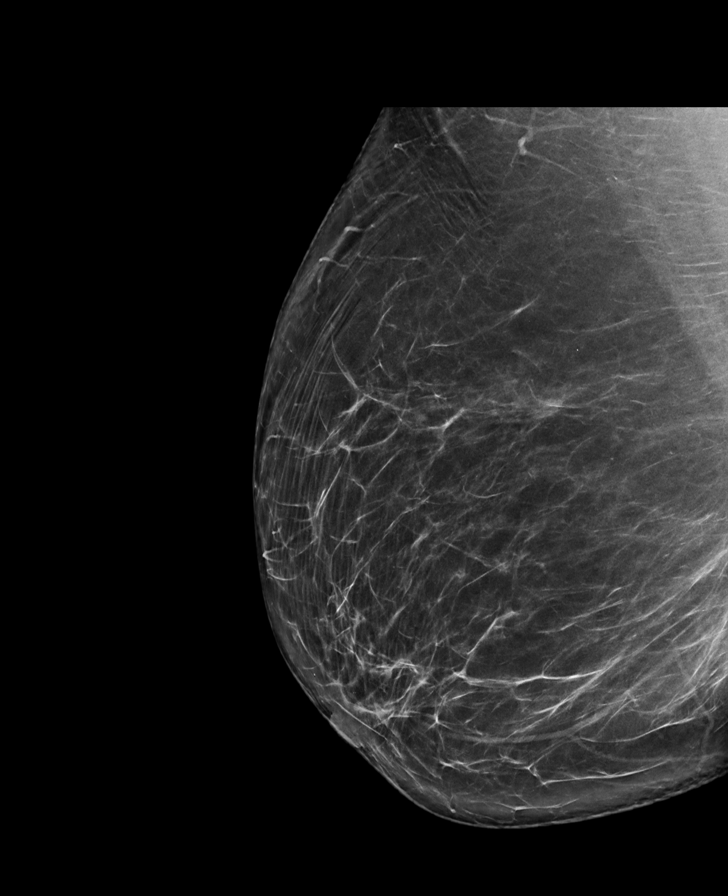

[L XCCL synth-2D]
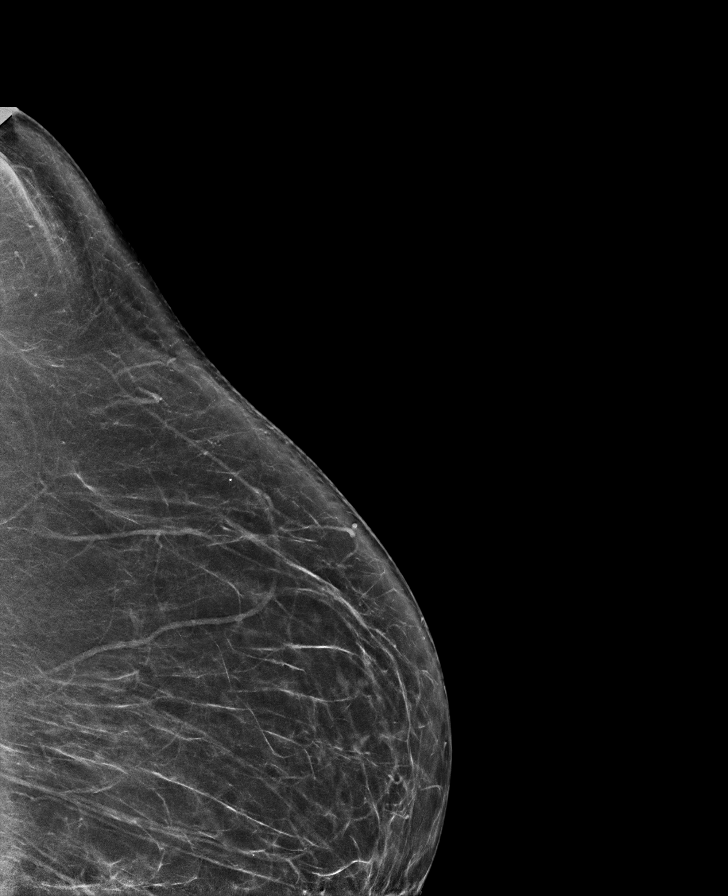

[L MLO tomo · tomo slice 44/87.0]
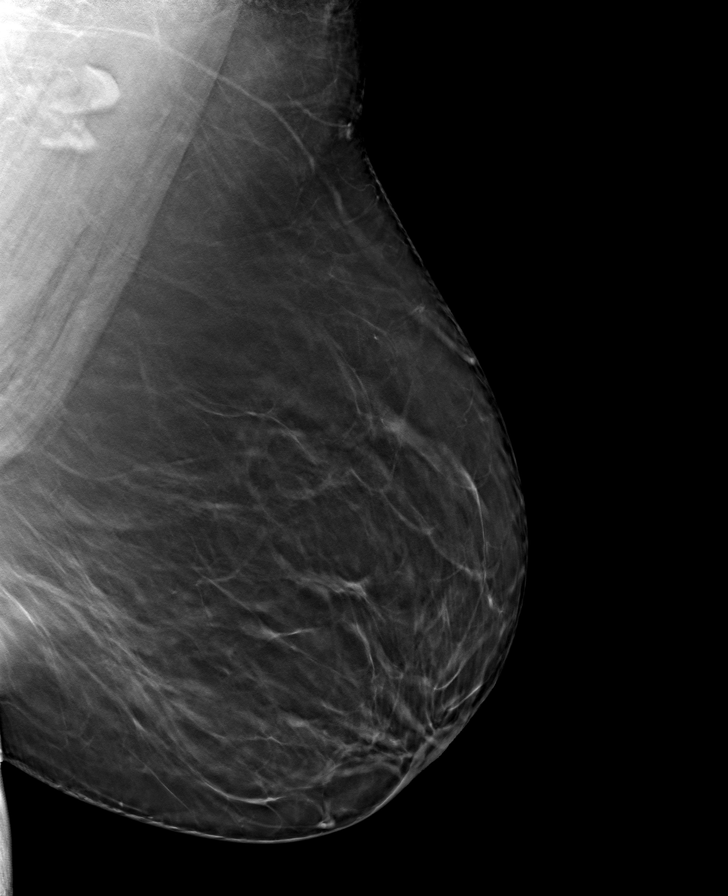

[8 of 40 positions shown; findings below may reference images not displayed]

ACR Breast Density Category b: There are scattered areas of
fibroglandular density.
FINDINGS: There are no findings suspicious for malignancy. Images were
processed with CAD.
IMPRESSION: No mammographic evidence of malignancy. A result letter of this
screening mammogram will be mailed directly to the patient.

RECOMMENDATION:
Screening mammogram in one year. (Code:[id number])

BI-RADS CATEGORY  1: Negative.

## 2021-03-29 DIAGNOSIS — E1142 Type 2 diabetes mellitus with diabetic polyneuropathy: Secondary | ICD-10-CM | POA: Diagnosis not present

## 2021-03-29 DIAGNOSIS — E039 Hypothyroidism, unspecified: Secondary | ICD-10-CM | POA: Diagnosis not present

## 2021-03-29 DIAGNOSIS — Z794 Long term (current) use of insulin: Secondary | ICD-10-CM | POA: Diagnosis not present

## 2021-03-29 NOTE — Progress Notes (Signed)
Carelink Summary Report / Loop Recorder 

## 2021-04-15 ENCOUNTER — Ambulatory Visit (INDEPENDENT_AMBULATORY_CARE_PROVIDER_SITE_OTHER): Payer: Medicare HMO

## 2021-04-15 DIAGNOSIS — E1165 Type 2 diabetes mellitus with hyperglycemia: Secondary | ICD-10-CM | POA: Diagnosis not present

## 2021-04-15 DIAGNOSIS — I6300 Cerebral infarction due to thrombosis of unspecified precerebral artery: Secondary | ICD-10-CM | POA: Diagnosis not present

## 2021-04-16 LAB — CUP PACEART REMOTE DEVICE CHECK
Date Time Interrogation Session: 20220909230427
Implantable Pulse Generator Implant Date: 20210831

## 2021-04-19 DIAGNOSIS — E1165 Type 2 diabetes mellitus with hyperglycemia: Secondary | ICD-10-CM | POA: Diagnosis not present

## 2021-04-19 DIAGNOSIS — R300 Dysuria: Secondary | ICD-10-CM | POA: Diagnosis not present

## 2021-04-19 DIAGNOSIS — D225 Melanocytic nevi of trunk: Secondary | ICD-10-CM | POA: Diagnosis not present

## 2021-04-19 DIAGNOSIS — E1142 Type 2 diabetes mellitus with diabetic polyneuropathy: Secondary | ICD-10-CM | POA: Diagnosis not present

## 2021-04-19 DIAGNOSIS — D2271 Melanocytic nevi of right lower limb, including hip: Secondary | ICD-10-CM | POA: Diagnosis not present

## 2021-04-19 DIAGNOSIS — L570 Actinic keratosis: Secondary | ICD-10-CM | POA: Diagnosis not present

## 2021-04-19 DIAGNOSIS — Z85820 Personal history of malignant melanoma of skin: Secondary | ICD-10-CM | POA: Diagnosis not present

## 2021-04-19 NOTE — Progress Notes (Signed)
Carelink Summary Report / Loop Recorder 

## 2021-04-21 ENCOUNTER — Emergency Department (HOSPITAL_BASED_OUTPATIENT_CLINIC_OR_DEPARTMENT_OTHER): Payer: Medicare HMO

## 2021-04-21 ENCOUNTER — Emergency Department (HOSPITAL_BASED_OUTPATIENT_CLINIC_OR_DEPARTMENT_OTHER)
Admission: EM | Admit: 2021-04-21 | Discharge: 2021-04-21 | Disposition: A | Discharge status: Home / Self Care | Payer: Medicare HMO | Attending: Emergency Medicine | Admitting: Emergency Medicine

## 2021-04-21 ENCOUNTER — Encounter (HOSPITAL_BASED_OUTPATIENT_CLINIC_OR_DEPARTMENT_OTHER): Payer: Self-pay

## 2021-04-21 ENCOUNTER — Other Ambulatory Visit: Payer: Self-pay

## 2021-04-21 DIAGNOSIS — E039 Hypothyroidism, unspecified: Secondary | ICD-10-CM | POA: Diagnosis not present

## 2021-04-21 DIAGNOSIS — W1830XA Fall on same level, unspecified, initial encounter: Secondary | ICD-10-CM | POA: Diagnosis not present

## 2021-04-21 DIAGNOSIS — M542 Cervicalgia: Secondary | ICD-10-CM | POA: Insufficient documentation

## 2021-04-21 DIAGNOSIS — M79672 Pain in left foot: Secondary | ICD-10-CM | POA: Diagnosis not present

## 2021-04-21 DIAGNOSIS — X501XXA Overexertion from prolonged static or awkward postures, initial encounter: Secondary | ICD-10-CM | POA: Diagnosis not present

## 2021-04-21 DIAGNOSIS — Z79899 Other long term (current) drug therapy: Secondary | ICD-10-CM | POA: Insufficient documentation

## 2021-04-21 DIAGNOSIS — Z87891 Personal history of nicotine dependence: Secondary | ICD-10-CM | POA: Insufficient documentation

## 2021-04-21 DIAGNOSIS — J45909 Unspecified asthma, uncomplicated: Secondary | ICD-10-CM | POA: Diagnosis not present

## 2021-04-21 DIAGNOSIS — Z794 Long term (current) use of insulin: Secondary | ICD-10-CM | POA: Insufficient documentation

## 2021-04-21 DIAGNOSIS — Z85828 Personal history of other malignant neoplasm of skin: Secondary | ICD-10-CM | POA: Diagnosis not present

## 2021-04-21 DIAGNOSIS — R42 Dizziness and giddiness: Secondary | ICD-10-CM | POA: Insufficient documentation

## 2021-04-21 DIAGNOSIS — M7989 Other specified soft tissue disorders: Secondary | ICD-10-CM | POA: Diagnosis not present

## 2021-04-21 DIAGNOSIS — W19XXXA Unspecified fall, initial encounter: Secondary | ICD-10-CM

## 2021-04-21 DIAGNOSIS — E119 Type 2 diabetes mellitus without complications: Secondary | ICD-10-CM | POA: Diagnosis not present

## 2021-04-21 DIAGNOSIS — I10 Essential (primary) hypertension: Secondary | ICD-10-CM | POA: Diagnosis not present

## 2021-04-21 DIAGNOSIS — S0990XA Unspecified injury of head, initial encounter: Secondary | ICD-10-CM | POA: Diagnosis not present

## 2021-04-21 NOTE — ED Triage Notes (Addendum)
Pt arrives with reports of fall yesterday twisting left foot. Family reports putting ice on left foot and wrapping it, states she has not be able to walk on it and has been using a walker at home to ambulated. Pt reports her BP at home after fall was 55/30 educated patient about seeking medical attention when having low BP.

## 2021-04-21 NOTE — ED Notes (Signed)
Snack and orange juice ok'd by provider, sugar was 58, feeling hypoglycemic-per pt.

## 2021-04-21 NOTE — ED Notes (Signed)
Pt. Has continuous cbg monitor.  Has ranged 133 to 58 back to 167 after orange juice.

## 2021-04-21 NOTE — ED Provider Notes (Signed)
[facility] Provider Note   CSN: [id number] Arrival date & time: 04/21/21  1057     History Chief Complaint  Patient presents with   Cheryl [name] is a 55 y.o. female.  HPI Patient is a 55 year old female with a medical history as noted below.  She presents to the emergency department due to recurrent falls yesterday.  Patient states that she has been experiencing lightheadedness for the past few months.  She states that she has been working with her PCP to adjust her hypertension medications due to this.  Yesterday after taking medication she became lightheaded and fell to the floor.  Denies LOC.  States she struck her forehead during the fall.  Reports taking Plavix.  Also reports pain in the dorsum of the left foot as well as the midline cervical spine.  Denies any chest pain, abdominal pain, nausea, vomiting.    Past Medical History:  Diagnosis Date   Allergy    Anxiety    Asthma    Cancer ([place])    melanoma 2015 upper Left arm    Chronic airway obstruction, not elsewhere classified    Chronic pain    "q where; herniated disc in my tailbone" (02/09/2013)   DDD (degenerative disc disease)    CERVIAL AND LUMBAR   Depression    Edema    Fatty liver disease, nonalcoholic    Fibromyalgia    "severe" (02/09/2013)   Gastroparesis    from DM and chronic narcotic use   GERD (gastroesophageal reflux disease)    Human parvovirus infection    Hyperlipidemia    Hypertension    Hypothyroidism    Interstitial cystitis    Migraine headache    "weekly; worse lately" (02/09/2013)   OSA (obstructive sleep apnea)    "have mask;; don't use it; no one came out to check it" (02/09/2013)   Osteoporosis    Peripheral neuropathy    "severe" (02/09/2013)   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   PONV (postoperative nausea and vomiting)    Positive PPD    Shortness of breath    "at anytime; it's  gotten worse recently" (02/09/2013)   Sleep apnea    Type II diabetes mellitus ([place])    Vitamin D deficiency    Vocal cord dysfunction     Patient Active Problem List   Diagnosis Date Noted   Acute metabolic encephalopathy 36/64/4034   Stroke due to occlusion of left middle cerebral artery ([place]) 03/24/2020   Mixed diabetic hyperlipidemia associated with type 2 diabetes mellitus ([place]) 03/24/2020   Belching 10/11/2019   Abdominal pain, epigastric 10/11/2019   Bloating 10/11/2019   Loss of weight 10/11/2019   Early satiety 10/11/2019   Pneumothorax on right 06/28/2019   ILD (interstitial lung disease) ([place])    Acute respiratory failure ([place])    Medication management 06/06/2019   Lobar pneumonia, unspecified organism ([place]) 05/26/2019   Fever 05/26/2019   Dysphagia 04/30/2018   Hoarseness 04/30/2018   Chest pain at rest 08/26/2017   Dyspnea 08/26/2017   OSA (obstructive sleep apnea) 01/05/2017   H/O melanoma excision 02/11/2016   Breast nodule 02/11/2016   Nausea without vomiting 12/12/2015   Leucocytosis 12/12/2015   Thrush 12/12/2015   Abdominal pain 12/12/2015   Thoracic aorta atherosclerosis ([place]) 06/06/2015   Chest wall pain 12/25/2014   Snoring 06/29/2014   Obesity, unspecified 02/04/2014   DKA, type 2 ([place]) 02/03/2014  Hypothyroidism 03/02/2013   IDDM (insulin dependent diabetes mellitus) ([place]) 03/01/2013   Hypoglycemia 02/09/2013   Hypokalemia 02/09/2013   Thrush of mouth and esophagus ([place]) 09/12/2012   Obesity 05/25/2012   Cough due to angiotensin-converting enzyme inhibitor 07/12/2011   DDD (degenerative disc disease)    Edema    Human parvovirus infection    Polyarthropathy associated with another disorder    Fatty liver disease, nonalcoholic    Migraine headache    Positive PPD    Vitamin D deficiency    Vocal cord dysfunction    RHINOSINUSITIS, 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   Essential hypertension 06/06/2009   UNSPECIFIED TACHYCARDIA 06/06/2009   ABNORMAL  ELECTROCARDIOGRAM 06/06/2009   IDDM 04/09/2009   Asthma not dependent on systemic steroids without complication 23/53/6144   GERD without esophagitis 04/09/2009   Chronic fatigue syndrome 04/09/2009    Past Surgical History:  Procedure Laterality Date   ABDOMINAL HYSTERECTOMY  1993   ANTERIOR CERVICAL DECOMP/DISCECTOMY FUSION  2004   arm surgery     left 2018   BRONCHOSCOPY  06/28/2019   BUBBLE STUDY  03/27/2020   Procedure: BUBBLE STUDY;  Surgeon: Josue Hector, MD;  Location: [facility];  Service: Cardiovascular;;   CARPAL TUNNEL RELEASE Bilateral ?1980's   CHOLECYSTECTOMY  ?1990   COLONOSCOPY     HIP SURGERY Right 2002   "did something to the tibula band" (02/09/2013)   KNEE ARTHROSCOPY Left 1990's   "fell; clot behind knee cap had to be removed" (02/09/2013)   LOOP RECORDER INSERTION N/A 03/27/2020   Procedure: LOOP RECORDER INSERTION;  Surgeon: Thompson Grayer, MD;  Location: [facility];  Service: Cardiovascular;  Laterality: N/A;   NASAL SINUS SURGERY  1986; 1990's   "i've had 3" (02/09/2013)   RIGHT/LEFT HEART CATH AND CORONARY ANGIOGRAPHY N/A 08/26/2017   Procedure: RIGHT/LEFT HEART CATH AND CORONARY ANGIOGRAPHY;  Surgeon: Belva Crome, MD;  Location: [facility];  Service: Cardiovascular;  Laterality: N/A;   SHOULDER ARTHROSCOPY W/ ROTATOR CUFF REPAIR Right    "had to go deep in rotator cuff" (02/09/2013)   TEE WITHOUT CARDIOVERSION N/A 03/27/2020   Procedure: TRANSESOPHAGEAL ECHOCARDIOGRAM (TEE);  Surgeon: Josue Hector, MD;  Location: [facility];  Service: Cardiovascular;  Laterality: N/A;   VIDEO BRONCHOSCOPY Bilateral 06/28/2019   Procedure: VIDEO BRONCHOSCOPY WITH FLUORO;  Surgeon: Marshell Garfinkel, MD;  Location: [facility];  Service: Cardiopulmonary;  Laterality: Bilateral;     OB History   No obstetric history on file.     Family History  Problem Relation Age of Onset   Coronary artery disease Father    Diabetes Brother    Diabetes Brother     Breast cancer Maternal Aunt    Breast cancer Cousin    Breast cancer Cousin    Colon cancer Neg Hx    Esophageal cancer Neg Hx    Liver cancer Neg Hx    Pancreatic cancer Neg Hx    Rectal cancer Neg Hx    Stomach cancer Neg Hx     Social History   Tobacco Use   Smoking status: Former    Packs/day: 0.10    Years: 3.00    Pack years: 0.30    Types: Cigarettes    Quit date: 2010    Years since quitting: 12.7   Smokeless tobacco: Never   Tobacco comments:    only smokes in high school then every now and then  Vaping Use   Vaping Use: Never used  Substance Use  Topics   Alcohol use: No   Drug use: No    Home Medications Prior to Admission medications   Medication Sig Start Date End Date Taking? Authorizing Provider  albuterol (VENTOLIN HFA) 108 (90 Base) MCG/ACT inhaler Inhale 2 puffs into the lungs every 6 (six) hours as needed for wheezing or shortness of breath. Appointment needed before future refills. 02/12/21  Yes Young, Tarri Fuller D, MD  Cholecalciferol (VITAMIN D3) 1000 units CAPS Take 1,000 Units by mouth daily.   Yes [provider]  clopidogrel (PLAVIX) 75 MG tablet TAKE 1 TABLET (75 MG TOTAL) BY MOUTH DAILY. 02/11/21 08/10/21 Yes Garvin Fila, MD  diltiazem (CARDIZEM CD) 240 MG 24 hr capsule TAKE 1 CAPSULE BY MOUTH EVERY MORNING ON AN EMPTY STOMACH - [name] DENIED, FAXED DR Johnsie Cancel 10/22/15 SS Patient taking differently: Take 240 mg by mouth daily. 10/22/15  Yes Josue Hector, MD  FLUoxetine (PROZAC) 40 MG capsule Take 80 mg by mouth at bedtime.  12/30/18  Yes [provider]  Fluticasone-Umeclidin-Vilant (TRELEGY ELLIPTA) 100-62.5-25 MCG/INH AEPB Inhale 1 puff into the lungs daily. 12/06/20  Yes Young, Tarri Fuller D, MD  ibuprofen (ADVIL,MOTRIN) 800 MG tablet Take 800 mg by mouth every 8 (eight) hours as needed (pain).    Yes [provider]  levothyroxine (SYNTHROID, LEVOTHROID) 75 MCG tablet Take 75 mcg by mouth daily before breakfast.   Yes  [provider]  liraglutide (VICTOZA) 18 MG/3ML SOPN Inject 1.2 mg into the skin every evening.    Yes [provider]  losartan (COZAAR) 25 MG tablet Take 25 mg by mouth daily. 02/24/20  Yes [provider]  metoCLOPramide (REGLAN) 10 MG tablet Take 10 mg by mouth 3 (three) times daily. 02/24/20  Yes [provider]  nebivolol (BYSTOLIC) 10 MG tablet Take 1 tablet (10 mg total) by mouth daily. Please call and schedule a one year follow up appointment for further refills 02/11/21  Yes Josue Hector, MD  pantoprazole (PROTONIX) 40 MG tablet TAKE 1 TABLET TWICE DAILY 10/22/20  Yes Zehr, Laban Emperor, PA-C  pentosan polysulfate (ELMIRON) 100 MG capsule Take 100 mg by mouth 3 (three) times daily.   Yes [provider]  tiZANidine (ZANAFLEX) 4 MG tablet Take 4 mg by mouth 3 (three) times daily. May hold one dose if needed to operate motor vehicle. 12/30/18  Yes [provider]  atorvastatin (LIPITOR) 80 MG tablet Take 1 tablet (80 mg total) by mouth daily. 03/30/20 04/30/20  Terrilee Croak, MD  diazepam (VALIUM) 10 MG tablet Take 10 mg by mouth 2 (two) times daily as needed (panic attacks.).  01/05/19   [provider]  HUMULIN R U-500 KWIKPEN 500 UNIT/ML kwikpen Inject 100 Units into the skin daily.  02/26/16   [provider]  ipratropium-albuterol (DUONEB) 0.5-2.5 (3) MG/3ML SOLN Take 71m by nebulization every 4-6 hours if needed Patient taking differently: Inhale 3 mLs into the lungs every 4 (four) hours as needed (wheezing/shortness of breath.). 01/05/17   YDeneise Lever MD  nystatin-triamcinolone (MYCOLOG II) cream Apply 1 application topically 2 (two) times daily as needed (yeast in skin folds).  02/25/19   [provider]  promethazine (PHENERGAN) 25 MG tablet Take 25 mg by mouth 2 (two) times daily as needed for nausea. 02/09/20   [provider]  rizatriptan (MAXALT) 10 MG tablet Take 10 mg by mouth daily. 03/30/21    [provider]  SUMAtriptan (IMITREX) 20 MG/ACT nasal spray Place 20 mg  into the nose every 2 (two) hours as needed for migraine or headache. May repeat in 2 hours if headache persists or recurs.    [provider]    Allergies    Influenza vaccines, Iodinated diagnostic agents, Iodine-131, Iohexol, Levofloxacin, Nucynta [tapentadol hydrochloride], Pregabalin, Sulfonamide derivatives, Vantin [cefpodoxime], Vilazodone hcl, Amoxicillin, Biaxin [clarithromycin], Carbamazepine, Ciprofloxacin, Dilaudid [hydromorphone hcl], Iodides, and Percocet [oxycodone-acetaminophen]  Review of Systems   Review of Systems  All other systems reviewed and are negative. Ten systems reviewed and are negative for acute change, except as noted in the HPI.   Physical Exam Updated Vital Signs BP (!) 157/72   Pulse 70   Temp 98.3 F (36.8 C) (Oral)   Resp 14   Ht 5' 2"  (1.575 m)   Wt 88 kg   SpO2 98%   BMI 35.48 kg/m   Physical Exam Vitals and nursing note reviewed.  Constitutional:      General: She is not in acute distress.    Appearance: Normal appearance. She is well-developed.  HENT:     Head: Normocephalic and atraumatic.     Right Ear: External ear normal.     Left Ear: External ear normal.  Eyes:     General: No scleral icterus.       Right eye: No discharge.        Left eye: No discharge.     Extraocular Movements: Extraocular movements intact.     Conjunctiva/sclera: Conjunctivae normal.     Pupils: Pupils are equal, round, and reactive to light.  Neck:     Trachea: No tracheal deviation.     Comments: Mild tenderness appreciated along the midline cervical spine.  Additional moderate tenderness noted along the left cervical paraspinal musculature. Cardiovascular:     Rate and Rhythm: Normal rate.  Pulmonary:     Effort: Pulmonary effort is normal. No respiratory distress.     Breath sounds: No stridor.  Abdominal:     General: There is no distension.   Musculoskeletal:        General: Tenderness present. No swelling or deformity.     Cervical back: Normal range of motion and neck supple. Tenderness present.     Comments: Moderate tenderness noted along the dorsum of the left foot.  Mild edema in the region.  No ecchymosis or erythema noted.  No tenderness appreciated in the left ankle.  Full active range of motion of the left ankle.  Patient able to wiggle the toes without difficulty.  2+ DP pulses.  Distal sensation intact.  Skin:    General: Skin is warm and dry.     Findings: No rash.  Neurological:     Mental Status: She is alert.     Cranial Nerves: Cranial nerve deficit: no gross deficits.    ED Results / Procedures / Treatments   Labs (all labs ordered are listed, but only abnormal results are displayed) Labs Reviewed - No data to display  EKG None  Radiology CT HEAD WO CONTRAST (5MM)  Result Date: 04/21/2021 CLINICAL DATA:  Dizzy and falling for the past 3 weeks. Head trauma, coagulopathy. Neck pain, acute no red flags. EXAM: CT HEAD WITHOUT CONTRAST CT CERVICAL SPINE WITHOUT CONTRAST TECHNIQUE: Multidetector CT imaging of the head and cervical spine was performed following the standard protocol without intravenous contrast. Multiplanar CT image reconstructions of the cervical spine were also generated. COMPARISON:  MR head 08/08/2020 FINDINGS: CT HEAD FINDINGS BRAIN: BRAIN Chronic lacunar infarction of the left basal ganglia.  No evidence of large-territorial acute infarction. No parenchymal hemorrhage. No mass lesion. No extra-axial collection. No mass effect or midline shift. No hydrocephalus. Basilar cisterns are patent. Vascular: No hyperdense vessel. Skull: No acute fracture or focal lesion. Sinuses/Orbits: Paranasal sinuses and mastoid air cells are clear. The orbits are unremarkable. Other: None. CT CERVICAL SPINE FINDINGS Alignment: Straightening of the normal cervical lordosis due to surgical hardware and mild degenerative  changes. Skull base and vertebrae: C5-C6 anterior surgical hardware fusion. No severe osseous neural foraminal or central canal stenosis. No acute fracture. No aggressive appearing focal osseous lesion or focal pathologic process. Soft tissues and spinal canal: No prevertebral fluid or swelling. No visible canal hematoma. Upper chest: Unremarkable.  Incidentally noted azygous fissure. Other: None. IMPRESSION: 1. No acute intracranial abnormality. 2. No acute displaced fracture or traumatic listhesis of the cervical spine. Electronically Signed   By: Iven Finn M.D.   On: 04/21/2021 15:02   CT Cervical Spine Wo Contrast  Result Date: 04/21/2021 CLINICAL DATA:  Dizzy and falling for the past 3 weeks. Head trauma, coagulopathy. Neck pain, acute no red flags. EXAM: CT HEAD WITHOUT CONTRAST CT CERVICAL SPINE WITHOUT CONTRAST TECHNIQUE: Multidetector CT imaging of the head and cervical spine was performed following the standard protocol without intravenous contrast. Multiplanar CT image reconstructions of the cervical spine were also generated. COMPARISON:  MR head 08/08/2020 FINDINGS: CT HEAD FINDINGS BRAIN: BRAIN Chronic lacunar infarction of the left basal ganglia. No evidence of large-territorial acute infarction. No parenchymal hemorrhage. No mass lesion. No extra-axial collection. No mass effect or midline shift. No hydrocephalus. Basilar cisterns are patent. Vascular: No hyperdense vessel. Skull: No acute fracture or focal lesion. Sinuses/Orbits: Paranasal sinuses and mastoid air cells are clear. The orbits are unremarkable. Other: None. CT CERVICAL SPINE FINDINGS Alignment: Straightening of the normal cervical lordosis due to surgical hardware and mild degenerative changes. Skull base and vertebrae: C5-C6 anterior surgical hardware fusion. No severe osseous neural foraminal or central canal stenosis. No acute fracture. No aggressive appearing focal osseous lesion or focal pathologic process. Soft tissues  and spinal canal: No prevertebral fluid or swelling. No visible canal hematoma. Upper chest: Unremarkable.  Incidentally noted azygous fissure. Other: None. IMPRESSION: 1. No acute intracranial abnormality. 2. No acute displaced fracture or traumatic listhesis of the cervical spine. Electronically Signed   By: Iven Finn M.D.   On: 04/21/2021 15:02   DG Foot Complete Left  Result Date: 04/21/2021 CLINICAL DATA:  55 year old female status post fall yesterday with pain and swelling. EXAM: LEFT FOOT - COMPLETE 3+ VIEW COMPARISON:  None. FINDINGS: Bone mineralization is within normal limits. There is no evidence of fracture or dislocation. Mild degenerative spurring at the calcaneus. Joint spaces and alignment are normal. No discrete soft tissue injury. IMPRESSION: No acute fracture or dislocation identified about the left foot. Electronically Signed   By: Genevie Ann M.D.   On: 04/21/2021 11:39    Procedures Procedures   Medications Ordered in ED Medications - No data to display  ED Course  I have reviewed the triage vital signs and the nursing notes.  Pertinent labs & imaging results that were available during my care of the patient were reviewed by me and considered in my medical decision making (see chart for details).    MDM Rules/Calculators/A&P                          Pt is a 55 y.o. female who presents  to the ED d/t multiple falls that occurred yesterday.  Imaging: X-ray of the left foot shows no acute fracture or dislocation identified. CT scan of the head and cervical spine without contrast shows no acute intracranial abnormality.  No acute displaced fracture or traumatic listhesis of the cervical spine.  I, Rayna Sexton, PA-C, personally reviewed and evaluated these images and lab results as part of my medical decision-making.  Physical exam significant for tenderness along the dorsum of her right.  No ecchymosis or erythema noted.  FROM of the left ankle. NVI distal to the  injury. Will place in a CAM boot for comfort.  Recommended continued use of patient's prescribed ibuprofen for management of her pain.  RICE method.  Patient also notes recurrent hypotension and lightheadedness for the past several months. She states her PCP has been adjusting her BP meds due to this. After taking her medications yesterday she became lightheaded and then experienced her falls. She is on plavix and did note that she struck her forehead. PE also significant for midline cervical spine pain. CTs of the head and neck were reassuring.  Recommended patient continue to monitor her blood pressure closely.  Patient understands to come back to the emergency department if she develops recurrent hypotension or bradycardia.  Otherwise, I recommended that she follow-up with her PCP on Monday to discuss her symptoms over the weekend.  We discussed return precautions at length.  Feel that she is stable for discharge at this time and she is agreeable.  Her questions were answered and she was amicable at the time of discharge.  Note: Portions of this report may have been transcribed using voice recognition software. Every effort was made to ensure accuracy; however, inadvertent computerized transcription errors may be present.   Final Clinical Impression(s) / ED Diagnoses Final diagnoses:  Fall, initial encounter  Left foot pain   Rx / DC Orders ED Discharge Orders     None        Rayna Sexton, PA-C 04/21/21 1536    Gareth Morgan, MD 04/22/21 [phone]

## 2021-04-21 NOTE — Discharge Instructions (Addendum)
Please continue to take your prescribed ibuprofen as needed for management of pain.  Please continue to wear the cam boot as needed for comfort.  I would also recommend applying ice and elevating the foot to help with pain and swelling.  Please continue to monitor your blood pressure closely.  If you find that it continues to lower and you are having difficulty with falls please come back to the emergency department immediately for reevaluation.  Please follow-up with your regular doctor on Monday regarding your blood pressure as well as your fall yesterday.  It was a pleasure to meet you.

## 2021-04-21 NOTE — ED Notes (Signed)
Pt. CBG 167, arm monitor.

## 2021-04-23 DIAGNOSIS — G894 Chronic pain syndrome: Secondary | ICD-10-CM | POA: Diagnosis not present

## 2021-04-23 DIAGNOSIS — E1142 Type 2 diabetes mellitus with diabetic polyneuropathy: Secondary | ICD-10-CM | POA: Diagnosis not present

## 2021-04-23 DIAGNOSIS — K7581 Nonalcoholic steatohepatitis (NASH): Secondary | ICD-10-CM | POA: Diagnosis not present

## 2021-04-23 DIAGNOSIS — I10 Essential (primary) hypertension: Secondary | ICD-10-CM | POA: Diagnosis not present

## 2021-04-23 DIAGNOSIS — I951 Orthostatic hypotension: Secondary | ICD-10-CM | POA: Diagnosis not present

## 2021-04-23 DIAGNOSIS — M797 Fibromyalgia: Secondary | ICD-10-CM | POA: Diagnosis not present

## 2021-04-23 DIAGNOSIS — K219 Gastro-esophageal reflux disease without esophagitis: Secondary | ICD-10-CM | POA: Diagnosis not present

## 2021-04-23 DIAGNOSIS — E669 Obesity, unspecified: Secondary | ICD-10-CM | POA: Diagnosis not present

## 2021-04-23 DIAGNOSIS — F418 Other specified anxiety disorders: Secondary | ICD-10-CM | POA: Diagnosis not present

## 2021-04-30 ENCOUNTER — Ambulatory Visit: Payer: Self-pay | Admitting: Neurology

## 2021-05-08 ENCOUNTER — Other Ambulatory Visit: Payer: Self-pay

## 2021-05-08 DIAGNOSIS — I10 Essential (primary) hypertension: Secondary | ICD-10-CM | POA: Diagnosis not present

## 2021-05-08 NOTE — Patient Outreach (Signed)
[facility]) Care Management  [place]  05/08/2021   Cheryl [name] 03-Nov-1965 [id number]  Subjective: Telephone call to patient for disease management. Patient doing pretty good but reports a fall with injury to left foot.  She admits to not using her walker.  Discussed fall safety.  Diabetes management continues.    Objective:   Encounter Medications:  Outpatient Encounter Medications as of 05/08/2021  Medication Sig Note   albuterol (VENTOLIN HFA) 108 (90 Base) MCG/ACT inhaler Inhale 2 puffs into the lungs every 6 (six) hours as needed for wheezing or shortness of breath. Appointment needed before future refills.    atorvastatin (LIPITOR) 80 MG tablet Take 1 tablet (80 mg total) by mouth daily.    Cholecalciferol (VITAMIN D3) 1000 units CAPS Take 1,000 Units by mouth daily.    clopidogrel (PLAVIX) 75 MG tablet TAKE 1 TABLET (75 MG TOTAL) BY MOUTH DAILY.    diazepam (VALIUM) 10 MG tablet Take 10 mg by mouth 2 (two) times daily as needed (panic attacks.).     diltiazem (CARDIZEM CD) 240 MG 24 hr capsule TAKE 1 CAPSULE BY MOUTH EVERY MORNING ON AN EMPTY STOMACH - [name] DENIED, FAXED DR Johnsie Cancel 10/22/15 SS (Patient taking differently: Take 240 mg by mouth daily.) 04/21/2021: Taking 158m or 1378m   FLUoxetine (PROZAC) 40 MG capsule Take 80 mg by mouth at bedtime.     Fluticasone-Umeclidin-Vilant (TRELEGY ELLIPTA) 100-62.5-25 MCG/INH AEPB Inhale 1 puff into the lungs daily.    HUMULIN R U-500 KWIKPEN 500 UNIT/ML kwikpen Inject 100 Units into the skin daily.  04/24/2020: Taking 130 units am and 30 units in the PM   ibuprofen (ADVIL,MOTRIN) 800 MG tablet Take 800 mg by mouth every 8 (eight) hours as needed (pain).     ipratropium-albuterol (DUONEB) 0.5-2.5 (3) MG/3ML SOLN Take 8m59my nebulization every 4-6 hours if needed (Patient taking differently: Inhale 3 mLs into the lungs every 4 (four) hours as needed (wheezing/shortness of breath.).)    levothyroxine  (SYNTHROID, LEVOTHROID) 75 MCG tablet Take 75 mcg by mouth daily before breakfast.    liraglutide (VICTOZA) 18 MG/3ML SOPN Inject 1.2 mg into the skin every evening.     losartan (COZAAR) 25 MG tablet Take 25 mg by mouth daily.    metoCLOPramide (REGLAN) 10 MG tablet Take 10 mg by mouth 3 (three) times daily.    nebivolol (BYSTOLIC) 10 MG tablet Take 1 tablet (10 mg total) by mouth daily. Please call and schedule a one year follow up appointment for further refills    nystatin-triamcinolone (MYCOLOG II) cream Apply 1 application topically 2 (two) times daily as needed (yeast in skin folds).     pantoprazole (PROTONIX) 40 MG tablet TAKE 1 TABLET TWICE DAILY    pentosan polysulfate (ELMIRON) 100 MG capsule Take 100 mg by mouth 3 (three) times daily.    promethazine (PHENERGAN) 25 MG tablet Take 25 mg by mouth 2 (two) times daily as needed for nausea.    rizatriptan (MAXALT) 10 MG tablet Take 10 mg by mouth daily.    SUMAtriptan (IMITREX) 20 MG/ACT nasal spray Place 20 mg into the nose every 2 (two) hours as needed for migraine or headache. May repeat in 2 hours if headache persists or recurs.    tiZANidine (ZANAFLEX) 4 MG tablet Take 4 mg by mouth 3 (three) times daily. May hold one dose if needed to operate motor vehicle.    No facility-administered encounter medications on file as of 05/08/2021.  Functional Status:  In your present state of health, do you have any difficulty performing the following activities: 03/12/2021 07/06/2020  Hearing? N N  Vision? N N  Difficulty concentrating or making decisions? Y Y  Comment short term memory loss short term memory loss  Walking or climbing stairs? [name]  Comment Stroke history Stroke history  Dressing or bathing? N N  Doing errands, shopping? Y Y  Comment daugher and mother assist daugher and mother [place] and eating ? [name]  Comment family helps family helps  Using the Toilet? N N  In the past six months, have you accidently  leaked urine? N N  Do you have problems with loss of bowel control? N N  Managing your Medications? [name]  Comment daughter helps daughter Helps  Managing your Finances? [name]  Comment daughter helps daughter helps  Housekeeping or managing your Housekeeping? [name]  Comment daughter helps daughter helps  Some recent data might be hidden    Fall/Depression Screening: Fall Risk  09/27/2020 07/06/2020 03/30/2020  Falls in the past year? 1 1 1   Comment - - -  Number falls in past yr: 1 1 1   Injury with Fall? 1 0 0  Risk Factor Category  - - -  Risk for fall due to : Impaired balance/gait;History of fall(s) History of fall(s) History of fall(s)  Follow up Falls prevention discussed;Education provided Falls prevention discussed Falls prevention discussed   PHQ 2/9 Scores 03/12/2021 07/06/2020 06/08/2020 03/30/2020 09/27/2019 07/31/2017 07/03/2017  PHQ - 2 Score 1 0 0 1 1 1 1   PHQ- 9 Score - - - - - - -    Assessment:   Care Plan Care Plan : Diabetes Type 2 (Adult)  Updates made by Jon Billings, RN since 05/08/2021 12:00 AM     Problem: Disease Progression (Diabetes, Type 2)      Long-Range Goal: Disease Progression Prevented or Minimized as evidenced by patient reporting sugars less than 300.   Start Date: 05/07/2020  Expected End Date: 01/24/2022  This Visit's Progress: On track  Recent Progress: On track  Priority: High  Note:   Evidence-based guidance:  Prepare patient for laboratory and diagnostic exams based on risk and presentation.  Encourage lifestyle changes, such as increased intake of plant-based foods, stress reduction, consistent physical activity and smoking cessation to prevent long-term complications and chronic disease.   Notes:     Task: Monitor and Manage Follow-up for Comorbidities   Due Date: 01/24/2022  Priority: Routine  Responsible User: Jon Billings, RN  Note:   Care Management Activities:    - reduction of sedentary activity encouraged - response to  pharmacologic therapy monitored - signs/symptoms of comorbidities identified    Notes: Patient managing well post stroke 05/22/20.  Patient has some problems with memory.   Encouraged patient to do puzzles to help with her memory.  09/27/20 patient sugars up and down. Encouraged patient to eat regularly.   03/12/21 Patient sugars in 200-300 range. Patient continues to word find but doing good.   Diabetes Management Discussed: Medication adherence Reviewed importance of limiting carbs such as rice, potatoes, breads, and pastas. Also discussed limiting sweets and sugary drinks.  Discussed importance of portion control.  Also discussed importance of annual exams, foot exams, and eye exams.   05/08/21 [place] A1c 9.9 and sugars in the 200's. Recent increase with medication and repeat visit to endocrinologist next month.  Reiterated importance of diabetes management.  Care Plan : Fall Risk (Adult)  Updates made by Jon Billings, RN since 05/08/2021 12:00 AM     Problem: Fall Risk      Goal: Absence of Fall and Fall-Related Injury as evidenced by reports of no falls.   Start Date: 05/08/2021  Expected End Date: 07/26/2021  Priority: High  Note:   Evidence-based guidance:  Assess fall risk using a validated tool when available. Consider balance and gait impairment, muscle weakness, diminished vision or hearing, environmental hazards, presence of urinary or bowel urgency and/or incontinence.  Communicate fall injury risk to interprofessional healthcare team.  Develop a fall prevention plan with the patient and family.  Promote use of personal vision and auditory aids.  Promote reorientation, appropriate sensory stimulation, and routines to decrease risk of fall when changes in mental status are present.  Assess assistance level required for safe and effective self-care; consider referral for home care.  Encourage physical activity, such as performance of self-care at highest level of  ability, strength and balance exercise program, and provision of appropriate assistive devices; refer to rehabilitation therapy.  Refer to community-based fall prevention program where available.  If fall occurs, determine the cause and revise fall injury prevention plan.  Regularly review medication contribution to fall risk; consider risk related to polypharmacy and age.  Refer to pharmacist for consultation when concerns about medications are revealed.  Balance adequate pain management with potential for oversedation.  Provide guidance related to environmental modifications.  Consider supplementation with Vitamin D.   Notes:     Task: Identify and Manage Contributors to Fall Risk   Note:   Care Management Activities:    - assistive or adaptive device use encouraged - barriers to safety identified    Notes: 05/08/21 Patient reports a fall about 3 weeks ago where she hurt her left foot.  She states it is better. Patient states she was not using her walker.  Discussed importance of using assistive devices and prevention of falls.  She verbalized understanding.        Goals Addressed               This Visit's Progress     Prevent Falls and Injury        Timeframe:  Short-Term Goal Priority:  High Start Date:    09/27/20  05/08/21                       Expected End Date:    07/27/21                   Follow Up Date 07/27/21   - always wear low-heeled or flat shoes or slippers with nonskid soles - use a cane or walker    Why is this important?   Most falls happen when it is hard for you to walk safely. Your balance may be off because of an illness. You may have pain in your knees, hip or other joints.  You may be overly tired or taking medicines that make you sleepy. You may not be able to see or hear clearly.  Falls can lead to broken bones, bruises or other injuries.  There are things you can do to help prevent falling.     Notes: 05/08/21 Patient reports a fall about 3  weeks ago where she hurt her left foot.  She states it is better. Patient states she was not using her walker.  Discussed importance of using assistive devices and  prevention of falls.  She verbalized understanding.        THN-Monitor and Manage My Blood Sugar (pt-stated)   On track     Timeframe:  Long-Range Goal Priority:  High Start Date:  07/06/20                            Expected End Date: 01/24/22  Follow up: 07/27/21 - check blood sugar at prescribed times - check blood sugar if I feel it is too high or too low - take the blood sugar log to all doctor visits    Why is this important?   Checking your blood sugar at home helps to keep it from getting very high or very low.  Writing the results in a diary or log helps the doctor know how to care for you.  Your blood sugar log should have the time, date and the results.  Also, write down the amount of insulin or other medicine that you take.  Other information, like what you ate, exercise done and how you were feeling, will also be helpful.     Notes: Patient blood sugars lower with new insulin dosing.  Last check 132. 05/22/20 continue monitoring sugars and limiting carbohydrates.  08/30/20 Patient reports sugar are better with [facility]. 09/27/20 patient reports sugars are up and down with highest being in the 300's. Eat regular meals.  03/12/21 Patient sugars in 200-300 range. Patient continues to word find but doing good.   Diabetes Management Discussed: Medication adherence Reviewed importance of limiting carbs such as rice, potatoes, breads, and pastas. Also discussed limiting sweets and sugary drinks.  Discussed importance of portion control.  Also discussed importance of annual exams, foot exams, and eye exams.   05/08/21 Patient reports that her sugars are in the 200 range and A1c was 9.9.  Discussed importance of diabetes management and controlling sugars.  She verbalized understanding.          Plan:  Follow-up: Patient agrees  to Care Plan and Follow-up. Follow-up in 2 month(s)   [name], RN, MSN [place] Management Care Management Coordinator Direct Line [phone] Cell [phone] Toll Free: [phone]  Fax: [phone]

## 2021-05-08 NOTE — Patient Instructions (Signed)
Goals Addressed               This Visit's Progress     Prevent Falls and Injury        Timeframe:  Short-Term Goal Priority:  High Start Date:    09/27/20  05/08/21                       Expected End Date:    07/27/21                   Follow Up Date 07/27/21   - always wear low-heeled or flat shoes or slippers with nonskid soles - use a cane or walker    Why is this important?   Most falls happen when it is hard for you to walk safely. Your balance may be off because of an illness. You may have pain in your knees, hip or other joints.  You may be overly tired or taking medicines that make you sleepy. You may not be able to see or hear clearly.  Falls can lead to broken bones, bruises or other injuries.  There are things you can do to help prevent falling.     Notes: 05/08/21 Patient reports a fall about 3 weeks ago where she hurt her left foot.  She states it is better. Patient states she was not using her walker.  Discussed importance of using assistive devices and prevention of falls.  She verbalized understanding.        THN-Monitor and Manage My Blood Sugar (pt-stated)   On track     Timeframe:  Long-Range Goal Priority:  High Start Date:  07/06/20                            Expected End Date: 01/24/22  Follow up: 07/27/21 - check blood sugar at prescribed times - check blood sugar if I feel it is too high or too low - take the blood sugar log to all doctor visits    Why is this important?   Checking your blood sugar at home helps to keep it from getting very high or very low.  Writing the results in a diary or log helps the doctor know how to care for you.  Your blood sugar log should have the time, date and the results.  Also, write down the amount of insulin or other medicine that you take.  Other information, like what you ate, exercise done and how you were feeling, will also be helpful.     Notes: Patient blood sugars lower with new insulin dosing.  Last check  132. 05/22/20 continue monitoring sugars and limiting carbohydrates.  08/30/20 Patient reports sugar are better with [facility]. 09/27/20 patient reports sugars are up and down with highest being in the 300's. Eat regular meals.  03/12/21 Patient sugars in 200-300 range. Patient continues to word find but doing good.   Diabetes Management Discussed: Medication adherence Reviewed importance of limiting carbs such as rice, potatoes, breads, and pastas. Also discussed limiting sweets and sugary drinks.  Discussed importance of portion control.  Also discussed importance of annual exams, foot exams, and eye exams.   05/08/21 Patient reports that her sugars are in the 200 range and A1c was 9.9.  Discussed importance of diabetes management and controlling sugars.  She verbalized understanding.

## 2021-05-13 ENCOUNTER — Ambulatory Visit (INDEPENDENT_AMBULATORY_CARE_PROVIDER_SITE_OTHER): Payer: Medicare HMO

## 2021-05-13 DIAGNOSIS — I6300 Cerebral infarction due to thrombosis of unspecified precerebral artery: Secondary | ICD-10-CM | POA: Diagnosis not present

## 2021-05-14 LAB — CUP PACEART REMOTE DEVICE CHECK
Date Time Interrogation Session: 20221012230854
Implantable Pulse Generator Implant Date: 20210831

## 2021-05-21 NOTE — Progress Notes (Signed)
Carelink Summary Report / Loop Recorder 

## 2021-05-27 DIAGNOSIS — E039 Hypothyroidism, unspecified: Secondary | ICD-10-CM | POA: Diagnosis not present

## 2021-05-27 DIAGNOSIS — E1165 Type 2 diabetes mellitus with hyperglycemia: Secondary | ICD-10-CM | POA: Diagnosis not present

## 2021-05-27 DIAGNOSIS — J4530 Mild persistent asthma, uncomplicated: Secondary | ICD-10-CM | POA: Diagnosis not present

## 2021-05-27 DIAGNOSIS — G8929 Other chronic pain: Secondary | ICD-10-CM | POA: Diagnosis not present

## 2021-05-27 DIAGNOSIS — E782 Mixed hyperlipidemia: Secondary | ICD-10-CM | POA: Diagnosis not present

## 2021-05-27 DIAGNOSIS — K219 Gastro-esophageal reflux disease without esophagitis: Secondary | ICD-10-CM | POA: Diagnosis not present

## 2021-05-27 DIAGNOSIS — I10 Essential (primary) hypertension: Secondary | ICD-10-CM | POA: Diagnosis not present

## 2021-05-27 DIAGNOSIS — E1143 Type 2 diabetes mellitus with diabetic autonomic (poly)neuropathy: Secondary | ICD-10-CM | POA: Diagnosis not present

## 2021-05-27 DIAGNOSIS — E1142 Type 2 diabetes mellitus with diabetic polyneuropathy: Secondary | ICD-10-CM | POA: Diagnosis not present

## 2021-06-10 ENCOUNTER — Other Ambulatory Visit: Payer: Self-pay | Admitting: Internal Medicine

## 2021-06-12 DIAGNOSIS — I10 Essential (primary) hypertension: Secondary | ICD-10-CM | POA: Diagnosis not present

## 2021-06-17 ENCOUNTER — Ambulatory Visit (INDEPENDENT_AMBULATORY_CARE_PROVIDER_SITE_OTHER): Payer: Medicare HMO

## 2021-06-17 DIAGNOSIS — I6300 Cerebral infarction due to thrombosis of unspecified precerebral artery: Secondary | ICD-10-CM

## 2021-06-17 LAB — CUP PACEART REMOTE DEVICE CHECK
Date Time Interrogation Session: 20221114230333
Implantable Pulse Generator Implant Date: 20210831

## 2021-06-21 DIAGNOSIS — E1143 Type 2 diabetes mellitus with diabetic autonomic (poly)neuropathy: Secondary | ICD-10-CM | POA: Diagnosis not present

## 2021-06-21 DIAGNOSIS — I10 Essential (primary) hypertension: Secondary | ICD-10-CM | POA: Diagnosis not present

## 2021-06-21 DIAGNOSIS — J45909 Unspecified asthma, uncomplicated: Secondary | ICD-10-CM | POA: Diagnosis not present

## 2021-06-21 DIAGNOSIS — E782 Mixed hyperlipidemia: Secondary | ICD-10-CM | POA: Diagnosis not present

## 2021-06-21 DIAGNOSIS — E039 Hypothyroidism, unspecified: Secondary | ICD-10-CM | POA: Diagnosis not present

## 2021-06-21 DIAGNOSIS — E1142 Type 2 diabetes mellitus with diabetic polyneuropathy: Secondary | ICD-10-CM | POA: Diagnosis not present

## 2021-06-21 DIAGNOSIS — K219 Gastro-esophageal reflux disease without esophagitis: Secondary | ICD-10-CM | POA: Diagnosis not present

## 2021-06-21 DIAGNOSIS — G43009 Migraine without aura, not intractable, without status migrainosus: Secondary | ICD-10-CM | POA: Diagnosis not present

## 2021-06-21 DIAGNOSIS — E1165 Type 2 diabetes mellitus with hyperglycemia: Secondary | ICD-10-CM | POA: Diagnosis not present

## 2021-06-24 NOTE — Telephone Encounter (Signed)
This encounter was created in error - please disregard.

## 2021-06-25 NOTE — Progress Notes (Signed)
Carelink Summary Report / Loop Recorder 

## 2021-07-03 ENCOUNTER — Other Ambulatory Visit: Payer: Self-pay

## 2021-07-03 NOTE — Patient Instructions (Signed)
Patient Goals/Self-Care Activities: Take all medications as prescribed check blood sugar at prescribed times: three times daily take the blood sugar log to all doctor visits limit fast food meals to no more than 1 per week manage portion size prepare main meal at home 3 to 5 days each week Eat regular meals

## 2021-07-03 NOTE — Patient Outreach (Signed)
[facility]) Care Management  [place]  07/03/2021   Jenissa Tyrell Stotz Aug 22, 1965 [id number]  Subjective: Telephone call to patient for disease management follow up. Patient reports she is doing good. Patient blood sugars however have been in 300 and sometime 400 range.  Stressed to patient the importance of diabetes control and health related issues due to uncontrolled diabetes.  She verbalized understanding.  She voices no concerns.    Objective:   Encounter Medications:  Outpatient Encounter Medications as of 07/03/2021  Medication Sig Note   albuterol (VENTOLIN HFA) 108 (90 Base) MCG/ACT inhaler INHALE 2 PUFFS EVERY 6 HOURS AS NEEDED FOR WHEEZING OR SHORTNESS OF BREATH. NEED MD APPOINTMENT FOR REFILLS    atorvastatin (LIPITOR) 80 MG tablet Take 1 tablet (80 mg total) by mouth daily.    Cholecalciferol (VITAMIN D3) 1000 units CAPS Take 1,000 Units by mouth daily.    clopidogrel (PLAVIX) 75 MG tablet TAKE 1 TABLET (75 MG TOTAL) BY MOUTH DAILY.    diazepam (VALIUM) 10 MG tablet Take 10 mg by mouth 2 (two) times daily as needed (panic attacks.).     diltiazem (CARDIZEM CD) 240 MG 24 hr capsule TAKE 1 CAPSULE BY MOUTH EVERY MORNING ON AN EMPTY STOMACH - [name] DENIED, FAXED DR Johnsie Cancel 10/22/15 SS (Patient taking differently: Take 240 mg by mouth daily.) 04/21/2021: Taking 150m or 1354m   FLUoxetine (PROZAC) 40 MG capsule Take 80 mg by mouth at bedtime.     Fluticasone-Umeclidin-Vilant (TRELEGY ELLIPTA) 100-62.5-25 MCG/INH AEPB Inhale 1 puff into the lungs daily.    HUMULIN R U-500 KWIKPEN 500 UNIT/ML kwikpen Inject 100 Units into the skin daily.  04/24/2020: Taking 130 units am and 30 units in the PM   ibuprofen (ADVIL,MOTRIN) 800 MG tablet Take 800 mg by mouth every 8 (eight) hours as needed (pain).     ipratropium-albuterol (DUONEB) 0.5-2.5 (3) MG/3ML SOLN Take 80m44my nebulization every 4-6 hours if needed (Patient taking differently: Inhale 3 mLs into the  lungs every 4 (four) hours as needed (wheezing/shortness of breath.).)    levothyroxine (SYNTHROID, LEVOTHROID) 75 MCG tablet Take 75 mcg by mouth daily before breakfast.    liraglutide (VICTOZA) 18 MG/3ML SOPN Inject 1.2 mg into the skin every evening.     losartan (COZAAR) 25 MG tablet Take 25 mg by mouth daily.    metoCLOPramide (REGLAN) 10 MG tablet Take 10 mg by mouth 3 (three) times daily.    nebivolol (BYSTOLIC) 10 MG tablet Take 1 tablet (10 mg total) by mouth daily. Please call and schedule a one year follow up appointment for further refills    nystatin-triamcinolone (MYCOLOG II) cream Apply 1 application topically 2 (two) times daily as needed (yeast in skin folds).     pantoprazole (PROTONIX) 40 MG tablet TAKE 1 TABLET TWICE DAILY    pentosan polysulfate (ELMIRON) 100 MG capsule Take 100 mg by mouth 3 (three) times daily.    promethazine (PHENERGAN) 25 MG tablet Take 25 mg by mouth 2 (two) times daily as needed for nausea.    rizatriptan (MAXALT) 10 MG tablet Take 10 mg by mouth daily.    SUMAtriptan (IMITREX) 20 MG/ACT nasal spray Place 20 mg into the nose every 2 (two) hours as needed for migraine or headache. May repeat in 2 hours if headache persists or recurs.    tiZANidine (ZANAFLEX) 4 MG tablet Take 4 mg by mouth 3 (three) times daily. May hold one dose if needed to operate motor vehicle.  No facility-administered encounter medications on file as of 07/03/2021.    Functional Status:  In your present state of health, do you have any difficulty performing the following activities: 03/12/2021 07/06/2020  Hearing? N N  Vision? N N  Difficulty concentrating or making decisions? Y Y  Comment short term memory loss short term memory loss  Walking or climbing stairs? [name]  Comment Stroke history Stroke history  Dressing or bathing? N N  Doing errands, shopping? Y Y  Comment daugher and mother assist daugher and mother [place] and eating ? [name]  Comment family  helps family helps  Using the Toilet? N N  In the past six months, have you accidently leaked urine? N N  Do you have problems with loss of bowel control? N N  Managing your Medications? [name]  Comment daughter helps daughter Helps  Managing your Finances? [name]  Comment daughter helps daughter helps  Housekeeping or managing your Housekeeping? [name]  Comment daughter helps daughter helps  Some recent data might be hidden    Fall/Depression Screening: Fall Risk  09/27/2020 07/06/2020 03/30/2020  Falls in the past year? 1 1 1   Comment - - -  Number falls in past yr: 1 1 1   Injury with Fall? 1 0 0  Risk Factor Category  - - -  Risk for fall due to : Impaired balance/gait;History of fall(s) History of fall(s) History of fall(s)  Follow up Falls prevention discussed;Education provided Falls prevention discussed Falls prevention discussed   PHQ 2/9 Scores 03/12/2021 07/06/2020 06/08/2020 03/30/2020 09/27/2019 07/31/2017 07/03/2017  PHQ - 2 Score 1 0 0 1 1 1 1   PHQ- 9 Score - - - - - - -    Assessment:   Care Plan Care Plan : Diabetes Type 2 (Adult)  Updates made by Jon Billings, RN since 07/03/2021 12:00 AM  Completed 07/03/2021   Problem: Glycemic Management (Diabetes, Type 2) Resolved 07/03/2021     Problem: Disease Progression (Diabetes, Type 2) Resolved 07/03/2021     Long-Range Goal: Disease Progression Prevented or Minimized as evidenced by patient reporting sugars less than 300. Completed 07/03/2021  Start Date: 05/07/2020  Expected End Date: 01/24/2022  Recent Progress: On track  Priority: High  Note:   Evidence-based guidance:  Prepare patient for laboratory and diagnostic exams based on risk and presentation.  Encourage lifestyle changes, such as increased intake of plant-based foods, stress reduction, consistent physical activity and smoking cessation to prevent long-term complications and chronic disease.   Notes: 81/1/03 Resolving duplicate goal    Task: Monitor and Manage  Follow-up for Comorbidities Completed 07/03/2021  Due Date: 01/24/2022  Priority: Routine  Responsible User: Jon Billings, RN  Note:   Care Management Activities:    - reduction of sedentary activity encouraged - response to pharmacologic therapy monitored - signs/symptoms of comorbidities identified    Notes: Patient managing well post stroke 05/22/20.  Patient has some problems with memory.   Encouraged patient to do puzzles to help with her memory.  09/27/20 patient sugars up and down. Encouraged patient to eat regularly.   03/12/21 Patient sugars in 200-300 range. Patient continues to word find but doing good.   Diabetes Management Discussed: Medication adherence Reviewed importance of limiting carbs such as rice, potatoes, breads, and pastas. Also discussed limiting sweets and sugary drinks.  Discussed importance of portion control.  Also discussed importance of annual exams, foot exams, and eye exams.   05/08/21 [place] A1c 9.9  and sugars in the 200's. Recent increase with medication and repeat visit to endocrinologist next month.  Reiterated importance of diabetes management.       Care Plan : Fall Risk (Adult)  Updates made by Jon Billings, RN since 07/03/2021 12:00 AM  Completed 07/03/2021   Problem: Fall Risk Resolved 07/03/2021     Goal: Absence of Fall and Fall-Related Injury as evidenced by reports of no falls. Completed 07/03/2021  Start Date: 05/08/2021  Expected End Date: 07/26/2021  Priority: High  Note:   Evidence-based guidance:  Assess fall risk using a validated tool when available. Consider balance and gait impairment, muscle weakness, diminished vision or hearing, environmental hazards, presence of urinary or bowel urgency and/or incontinence.  Communicate fall injury risk to interprofessional healthcare team.  Develop a fall prevention plan with the patient and family.  Promote use of personal vision and auditory aids.  Promote reorientation, appropriate sensory  stimulation, and routines to decrease risk of fall when changes in mental status are present.  Assess assistance level required for safe and effective self-care; consider referral for home care.  Encourage physical activity, such as performance of self-care at highest level of ability, strength and balance exercise program, and provision of appropriate assistive devices; refer to rehabilitation therapy.  Refer to community-based fall prevention program where available.  If fall occurs, determine the cause and revise fall injury prevention plan.  Regularly review medication contribution to fall risk; consider risk related to polypharmacy and age.  Refer to pharmacist for consultation when concerns about medications are revealed.  Balance adequate pain management with potential for oversedation.  Provide guidance related to environmental modifications.  Consider supplementation with Vitamin D.   Notes:     Task: Identify and Manage Contributors to Fall Risk Completed 07/03/2021  Note:   Care Management Activities:    - assistive or adaptive device use encouraged - barriers to safety identified    Notes: 05/08/21 Patient reports a fall about 3 weeks ago where she hurt her left foot.  She states it is better. Patient states she was not using her walker.  Discussed importance of using assistive devices and prevention of falls.  She verbalized understanding.      Care Plan : RN Care Manager Plan of Care  Updates made by Jon Billings, RN since 07/03/2021 12:00 AM     Problem: Chronic disease management for Diabetes   Priority: High     Long-Range Goal: Development of plan of care related to diabetes management.   Start Date: 07/03/2021  Expected End Date: 07/27/2022  Priority: High  Note:   Current Barriers:  Chronic Disease Management support and education needs related to DMII   RNCM Clinical Goal(s):  Patient will verbalize basic understanding of  DMII disease process and self  health management plan as evidenced by Patient reporting keeping blood sugars less than 300 continue to work with [place] to address care management and care coordination needs related to  DMII as evidenced by adherence to CM Team Scheduled appointments through collaboration with RN Care manager, provider, and care team.   Interventions: Education and support related to diabetes management Inter-disciplinary care team collaboration (see longitudinal plan of care) Evaluation of current treatment plan related to  self management and patient's adherence to plan as established by provider   Diabetes Interventions:  (Status:  New goal.) Long Term Goal Assessed patient's understanding of A1c goal:  9.5 current goal for patient Provided education to patient about basic DM  disease process Discussed plans with patient for ongoing care management follow up and provided patient with direct contact information for care management team Lab Results  Component Value Date   HGBA1C 7.6 (H) 03/25/2020  07/03/21  Diabetes Management Discussed: Medication adherence Reviewed importance of limiting carbs such as rice, potatoes, breads, and pastas. Also discussed limiting sweets and sugary drinks.  Discussed importance of portion control.  Also discussed importance of annual exams, foot exams, and eye exams.    Patient Goals/Self-Care Activities: Take all medications as prescribed check blood sugar at prescribed times: three times daily take the blood sugar log to all doctor visits limit fast food meals to no more than 1 per week manage portion size prepare main meal at home 3 to 5 days each week Eat regular meals  Follow Up Plan:  Telephone follow up appointment with care management team member scheduled for:  March The patient has been provided with contact information for the care management team and has been advised to call with any health related questions or concerns.        Goals Addressed                This Visit's Progress     COMPLETED: Prevent Falls and Injury   On track     Timeframe:  Short-Term Goal Priority:  High Start Date:    09/27/20  05/08/21                       Expected End Date:    07/27/21                   Follow Up Date 07/27/21   - always wear low-heeled or flat shoes or slippers with nonskid soles - use a cane or walker    Why is this important?   Most falls happen when it is hard for you to walk safely. Your balance may be off because of an illness. You may have pain in your knees, hip or other joints.  You may be overly tired or taking medicines that make you sleepy. You may not be able to see or hear clearly.  Falls can lead to broken bones, bruises or other injuries.  There are things you can do to help prevent falling.     Notes: 05/08/21 Patient reports a fall about 3 weeks ago where she hurt her left foot.  She states it is better. Patient states she was not using her walker.  Discussed importance of using assistive devices and prevention of falls.  She verbalized understanding.   07/03/21 Complete      COMPLETED: THN-Monitor and Manage My Blood Sugar (pt-stated)   On track     Timeframe:  Long-Range Goal Priority:  High Start Date:  07/06/20                            Expected End Date: 01/24/22  Follow up: 07/27/21 - check blood sugar at prescribed times - check blood sugar if I feel it is too high or too low - take the blood sugar log to all doctor visits    Why is this important?   Checking your blood sugar at home helps to keep it from getting very high or very low.  Writing the results in a diary or log helps the doctor know how to care for you.  Your blood sugar log should have the  time, date and the results.  Also, write down the amount of insulin or other medicine that you take.  Other information, like what you ate, exercise done and how you were feeling, will also be helpful.     Notes: Patient blood sugars lower with  new insulin dosing.  Last check 132. 05/22/20 continue monitoring sugars and limiting carbohydrates.  08/30/20 Patient reports sugar are better with [facility]. 09/27/20 patient reports sugars are up and down with highest being in the 300's. Eat regular meals.  03/12/21 Patient sugars in 200-300 range. Patient continues to word find but doing good.   Diabetes Management Discussed: Medication adherence Reviewed importance of limiting carbs such as rice, potatoes, breads, and pastas. Also discussed limiting sweets and sugary drinks.  Discussed importance of portion control.  Also discussed importance of annual exams, foot exams, and eye exams.   05/08/21 Patient reports that her sugars are in the 200 range and A1c was 9.9.  Discussed importance of diabetes management and controlling sugars.  She verbalized understanding.   65/4/65 Resolving duplicate goal        Plan:  Follow-up: Patient agrees to Care Plan and Follow-up. Follow-up in 3 month(s)  [name], RN, MSN [place] [facility] Care Management Coordinator Direct Line [phone] Cell [phone] Toll Free: [phone]  Fax: [phone]

## 2021-07-04 ENCOUNTER — Other Ambulatory Visit: Payer: Self-pay | Admitting: Internal Medicine

## 2021-07-05 DIAGNOSIS — E039 Hypothyroidism, unspecified: Secondary | ICD-10-CM | POA: Diagnosis not present

## 2021-07-05 DIAGNOSIS — E1142 Type 2 diabetes mellitus with diabetic polyneuropathy: Secondary | ICD-10-CM | POA: Diagnosis not present

## 2021-07-05 DIAGNOSIS — I10 Essential (primary) hypertension: Secondary | ICD-10-CM | POA: Diagnosis not present

## 2021-07-05 DIAGNOSIS — Z794 Long term (current) use of insulin: Secondary | ICD-10-CM | POA: Diagnosis not present

## 2021-07-08 ENCOUNTER — Other Ambulatory Visit: Payer: Self-pay | Admitting: Cardiovascular Disease

## 2021-07-10 DIAGNOSIS — I10 Essential (primary) hypertension: Secondary | ICD-10-CM | POA: Diagnosis not present

## 2021-07-10 DIAGNOSIS — E039 Hypothyroidism, unspecified: Secondary | ICD-10-CM | POA: Diagnosis not present

## 2021-07-10 DIAGNOSIS — K219 Gastro-esophageal reflux disease without esophagitis: Secondary | ICD-10-CM | POA: Diagnosis not present

## 2021-07-10 DIAGNOSIS — E669 Obesity, unspecified: Secondary | ICD-10-CM | POA: Diagnosis not present

## 2021-07-10 DIAGNOSIS — K7581 Nonalcoholic steatohepatitis (NASH): Secondary | ICD-10-CM | POA: Diagnosis not present

## 2021-07-10 DIAGNOSIS — I679 Cerebrovascular disease, unspecified: Secondary | ICD-10-CM | POA: Diagnosis not present

## 2021-07-10 DIAGNOSIS — J4530 Mild persistent asthma, uncomplicated: Secondary | ICD-10-CM | POA: Diagnosis not present

## 2021-07-10 DIAGNOSIS — G4733 Obstructive sleep apnea (adult) (pediatric): Secondary | ICD-10-CM | POA: Diagnosis not present

## 2021-07-10 DIAGNOSIS — E1142 Type 2 diabetes mellitus with diabetic polyneuropathy: Secondary | ICD-10-CM | POA: Diagnosis not present

## 2021-07-15 DIAGNOSIS — E1165 Type 2 diabetes mellitus with hyperglycemia: Secondary | ICD-10-CM | POA: Diagnosis not present

## 2021-07-15 DIAGNOSIS — E1142 Type 2 diabetes mellitus with diabetic polyneuropathy: Secondary | ICD-10-CM | POA: Diagnosis not present

## 2021-07-16 LAB — CUP PACEART REMOTE DEVICE CHECK
Date Time Interrogation Session: 20221217230851
Implantable Pulse Generator Implant Date: 20210831

## 2021-07-23 ENCOUNTER — Ambulatory Visit (INDEPENDENT_AMBULATORY_CARE_PROVIDER_SITE_OTHER): Payer: Medicare HMO

## 2021-07-23 DIAGNOSIS — I6300 Cerebral infarction due to thrombosis of unspecified precerebral artery: Secondary | ICD-10-CM

## 2021-07-25 ENCOUNTER — Encounter: Payer: Self-pay | Admitting: Internal Medicine

## 2021-07-25 NOTE — Assessment & Plan Note (Signed)
Mild persistent uncomplicated.  Well-controlled with Trelegy.  She does have nebulizer machine if needed.  Infrequent use of rescue inhaler. Plan-continue current meds.

## 2021-07-25 NOTE — Assessment & Plan Note (Signed)
She had only minimal sleep apnea and did not feel benefit from CPAP.  She also has stopped her oxygen and seems to be stable. Plan-stay off of CPAP at oxygen.  Reassess in future if needed.

## 2021-07-27 DIAGNOSIS — I10 Essential (primary) hypertension: Secondary | ICD-10-CM | POA: Diagnosis not present

## 2021-07-27 DIAGNOSIS — J440 Chronic obstructive pulmonary disease with acute lower respiratory infection: Secondary | ICD-10-CM | POA: Diagnosis not present

## 2021-07-27 DIAGNOSIS — E1165 Type 2 diabetes mellitus with hyperglycemia: Secondary | ICD-10-CM | POA: Diagnosis not present

## 2021-07-27 DIAGNOSIS — E1142 Type 2 diabetes mellitus with diabetic polyneuropathy: Secondary | ICD-10-CM | POA: Diagnosis not present

## 2021-07-27 DIAGNOSIS — G8929 Other chronic pain: Secondary | ICD-10-CM | POA: Diagnosis not present

## 2021-07-27 DIAGNOSIS — E1143 Type 2 diabetes mellitus with diabetic autonomic (poly)neuropathy: Secondary | ICD-10-CM | POA: Diagnosis not present

## 2021-07-27 DIAGNOSIS — J45909 Unspecified asthma, uncomplicated: Secondary | ICD-10-CM | POA: Diagnosis not present

## 2021-07-27 DIAGNOSIS — E039 Hypothyroidism, unspecified: Secondary | ICD-10-CM | POA: Diagnosis not present

## 2021-07-27 DIAGNOSIS — E782 Mixed hyperlipidemia: Secondary | ICD-10-CM | POA: Diagnosis not present

## 2021-07-31 NOTE — Progress Notes (Signed)
Carelink Summary Report / Loop Recorder 

## 2021-08-02 DIAGNOSIS — Z85828 Personal history of other malignant neoplasm of skin: Secondary | ICD-10-CM | POA: Diagnosis not present

## 2021-08-02 DIAGNOSIS — C44319 Basal cell carcinoma of skin of other parts of face: Secondary | ICD-10-CM | POA: Diagnosis not present

## 2021-08-02 DIAGNOSIS — C441121 Basal cell carcinoma of skin of right upper eyelid, including canthus: Secondary | ICD-10-CM | POA: Diagnosis not present

## 2021-08-23 DIAGNOSIS — J45909 Unspecified asthma, uncomplicated: Secondary | ICD-10-CM | POA: Diagnosis not present

## 2021-08-23 DIAGNOSIS — I10 Essential (primary) hypertension: Secondary | ICD-10-CM | POA: Diagnosis not present

## 2021-08-23 DIAGNOSIS — J440 Chronic obstructive pulmonary disease with acute lower respiratory infection: Secondary | ICD-10-CM | POA: Diagnosis not present

## 2021-08-23 DIAGNOSIS — E1142 Type 2 diabetes mellitus with diabetic polyneuropathy: Secondary | ICD-10-CM | POA: Diagnosis not present

## 2021-08-23 DIAGNOSIS — E782 Mixed hyperlipidemia: Secondary | ICD-10-CM | POA: Diagnosis not present

## 2021-08-23 DIAGNOSIS — G43009 Migraine without aura, not intractable, without status migrainosus: Secondary | ICD-10-CM | POA: Diagnosis not present

## 2021-08-23 DIAGNOSIS — E039 Hypothyroidism, unspecified: Secondary | ICD-10-CM | POA: Diagnosis not present

## 2021-08-23 DIAGNOSIS — K219 Gastro-esophageal reflux disease without esophagitis: Secondary | ICD-10-CM | POA: Diagnosis not present

## 2021-08-23 DIAGNOSIS — E1165 Type 2 diabetes mellitus with hyperglycemia: Secondary | ICD-10-CM | POA: Diagnosis not present

## 2021-08-26 ENCOUNTER — Ambulatory Visit (INDEPENDENT_AMBULATORY_CARE_PROVIDER_SITE_OTHER): Payer: Medicare HMO

## 2021-08-26 DIAGNOSIS — I6300 Cerebral infarction due to thrombosis of unspecified precerebral artery: Secondary | ICD-10-CM | POA: Diagnosis not present

## 2021-08-26 LAB — CUP PACEART REMOTE DEVICE CHECK
Date Time Interrogation Session: 20230129230810
Implantable Pulse Generator Implant Date: 20210831

## 2021-09-02 NOTE — Progress Notes (Signed)
Carelink Summary Report / Loop Recorder 

## 2021-09-05 NOTE — Progress Notes (Signed)
°Subjective:  ° ° Patient ID: Cheryl [name], female    DOB: 02/14/1966, 56 y.o.   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 °HST 12/16/16- AHI 6.2/ hr, desaturation to 84%, body weight 213 lbs. °PFT 07/18/15-WNL-FVC 3.03/88%, FEV1 2.63/95%, ratio 0.87, TLC 105%, DLCO 113% °Echocardiogram 02/12/15-EF 60-65 percent, grade 1 diastolic dysfunction °Video bronchoscopy 06/28/2019- for evaluation of ground glass and tiny nodules, with concern of possible eosinophilic pneumonia, nonspecific with histiocytes noted- no malignancy or eosinophils. 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  Cough now almost gone. °CVA last summer.  No longer has oxygen. °Stopped using CPAP and O2.  She says she kept waking up panicking and stopped using it.  AHI in 2018 was only 6.2. °Her breathing today is at baseline. °CXR 10/24/19- °FINDINGS: °The heart size and mediastinal contours are within normal limits. No °focal airspace consolidation, pleural effusion, or pneumothorax. °Lower cervical ACDF hardware. No acute osseous findings. °IMPRESSION: °No active cardiopulmonary disease. 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  °She is comfortable without using O2 or CPAP with sleep.  °Denies cough or wheeze. °  °Review of Systems- see HPI   + = positive °Constitutional:   No-   weight loss, night sweats, fevers, chills,+irregular sleep schedule, +fatigue, lassitude. °HEENT:   No-headaches, difficulty swallowing, tooth/dental problems, +sore throat,  °     No-  sneezing, itching, +ear ache, nasal congestion, post nasal drip,  °CV:  chest pain, orthopnea, PND, swelling in lower extremities, anasarca,  dizziness, +palpitations °Resp: + shortness of breath with exertion or at rest.   °           No- productive cough,  + non-productive cough,  No- coughing up of blood.   °           No- change in color of mucus.   °Skin: No-   rash or lesions. °GI:  No-   heartburn, indigestion, abdominal pain, nausea, vomiting,  °GU:  °MS:  No-   joint pain or swelling.   °Neuro-    +tremor °Psych:  No- change in mood or affect. + depression or anxiety.  No memory loss. ° °Objective:  ° Physical Exam      °OBJ- Physical Exam °General- Alert, Oriented,  Distress- none acute. + overweight °Skin- rash-none, lesions- none, excoriation- none °Lymphadenopathy- none °Head-   Head- atraumatic            Eyes- Gross vision intact, PERRLA, conjunctivae and secretions clear            Ears- Hearing, canals-normal            Nose-  turbinate edema-none, no-Septal dev, mucus, polyps, erosion, perforation, + sniffing            Throat- Mallampati II ,  mucosa clear , drainage- none, tonsils- atrophic,  Neck- flexible , trachea midline, no stridor , thyroid nl, carotid no bruit Chest - symmetrical excursion , unlabored           Heart/CV- RRR , no murmur , no gallop  , no rub, nl s1 s2                           - JVD- none , edema- none, stasis changes- none, varices- none           Lung-  Clear, Wheeze-none, cough-none , dullness-none, rub- none           Chest wall-  Abd-  Br/ Gen/ Rectal- Not done, not indicated Extrem- cyanosis- none, clubbing, none, atrophy- none, strength- nl Neuro- +tremor

## 2021-09-06 ENCOUNTER — Other Ambulatory Visit: Payer: Self-pay

## 2021-09-06 ENCOUNTER — Ambulatory Visit: Payer: Medicare HMO | Admitting: Internal Medicine

## 2021-09-06 ENCOUNTER — Ambulatory Visit (INDEPENDENT_AMBULATORY_CARE_PROVIDER_SITE_OTHER): Payer: Medicare HMO

## 2021-09-06 ENCOUNTER — Encounter: Payer: Self-pay | Admitting: Internal Medicine

## 2021-09-06 VITALS — BP 160/90 | HR 75 | Temp 98.1°F | Ht 62.0 in | Wt 189.4 lb

## 2021-09-06 DIAGNOSIS — J45909 Unspecified asthma, uncomplicated: Secondary | ICD-10-CM | POA: Diagnosis not present

## 2021-09-06 DIAGNOSIS — J4530 Mild persistent asthma, uncomplicated: Secondary | ICD-10-CM | POA: Diagnosis not present

## 2021-09-06 DIAGNOSIS — J849 Interstitial pulmonary disease, unspecified: Secondary | ICD-10-CM

## 2021-09-06 DIAGNOSIS — G4733 Obstructive sleep apnea (adult) (pediatric): Secondary | ICD-10-CM

## 2021-09-06 NOTE — Patient Instructions (Signed)
We can continue current meds- let us know if you need reill  Order- CXR  dx Asthma mild persistent  Please call if we can help

## 2021-09-06 NOTE — Assessment & Plan Note (Signed)
She decided she felt better without O2 or CPAP for her minimal OSA and turned them both back in. Plan- observation

## 2021-09-06 NOTE — Assessment & Plan Note (Signed)
Sounds clear on exam Plan- CXR

## 2021-09-10 ENCOUNTER — Other Ambulatory Visit: Payer: Self-pay | Admitting: Internal Medicine

## 2021-09-10 NOTE — Telephone Encounter (Deleted)
Please advise patient requesting refill °

## 2021-09-10 NOTE — Progress Notes (Unsigned)
Cardiology Office Note    Date:  09/10/2021   ID:  Noor, Witte [birth year], MRN [id number]   PCP:  [name], [place]  Cardiologist:  Jenkins Rouge, MD   Advanced Practice Provider:  No care team member to display Electrophysiologist:  None   [phone]   No chief complaint on file.   History of Present Illness:  Cheryl [name] is a 56 y.o. female  with poorly controlled IDDM, HTN, HLD with statin non compliance former smoker and family history of premature CAD. Cath 2019 with normal coronary arteries     03/29/20 d/c from hospital with DKA and stroke Plavix added. This was not likely embolic located in left basal ganglia. TEE 03/27/20 with normal EF negative bubble and no LAA thrombus.  ILR implanted by EP      Past Medical History:  Diagnosis Date   Allergy    Anxiety    Asthma    Cancer ([place])    melanoma 2015 upper Left arm    Chronic airway obstruction, not elsewhere classified    Chronic pain    "q where; herniated disc in my tailbone" (02/09/2013)   DDD (degenerative disc disease)    CERVIAL AND LUMBAR   Depression    Edema    Fatty liver disease, nonalcoholic    Fibromyalgia    "severe" (02/09/2013)   Gastroparesis    from DM and chronic narcotic use   GERD (gastroesophageal reflux disease)    Human parvovirus infection    Hyperlipidemia    Hypertension    Hypothyroidism    Interstitial cystitis    Migraine headache    "weekly; worse lately" (02/09/2013)   OSA (obstructive sleep apnea)    "have mask;; don't use it; no one came out to check it" (02/09/2013)   Osteoporosis    Peripheral neuropathy    "severe" (02/09/2013)   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   PONV (postoperative nausea and vomiting)    Positive PPD    Shortness of breath    "at anytime; it's gotten worse recently" (02/09/2013)   Sleep apnea    Type II diabetes mellitus ([place])     Vitamin D deficiency    Vocal cord dysfunction     Past Surgical History:  Procedure Laterality Date   ABDOMINAL HYSTERECTOMY  1993   ANTERIOR CERVICAL DECOMP/DISCECTOMY FUSION  2004   arm surgery     left 2018   BRONCHOSCOPY  06/28/2019   BUBBLE STUDY  03/27/2020   Procedure: BUBBLE STUDY;  Surgeon: Josue Hector, MD;  Location: [facility];  Service: Cardiovascular;;   CARPAL TUNNEL RELEASE Bilateral ?1980's   CHOLECYSTECTOMY  ?1990   COLONOSCOPY     HIP SURGERY Right 2002   "did something to the tibula band" (02/09/2013)   KNEE ARTHROSCOPY Left 1990's   "fell; clot behind knee cap had to be removed" (02/09/2013)   LOOP RECORDER INSERTION N/A 03/27/2020   Procedure: LOOP RECORDER INSERTION;  Surgeon: Thompson Grayer, MD;  Location: [facility];  Service: Cardiovascular;  Laterality: N/A;   NASAL SINUS SURGERY  1986; 1990's   "i've had 3" (02/09/2013)   RIGHT/LEFT HEART CATH AND CORONARY ANGIOGRAPHY N/A 08/26/2017   Procedure: RIGHT/LEFT HEART CATH AND CORONARY ANGIOGRAPHY;  Surgeon: Belva Crome, MD;  Location: [facility];  Service: Cardiovascular;  Laterality: N/A;   SHOULDER ARTHROSCOPY W/ ROTATOR CUFF REPAIR  Right    "had to go deep in rotator cuff" (02/09/2013)   TEE WITHOUT CARDIOVERSION N/A 03/27/2020   Procedure: TRANSESOPHAGEAL ECHOCARDIOGRAM (TEE);  Surgeon: Josue Hector, MD;  Location[facility] END[facility]COPY;  Service: Cardiovascular;  Laterality: N/A;   VIDEO BRONCHOSCOPY Bilateral 06/28/2019   Procedure: VIDEO BRONCHOSCOPY WITH FLUORO;  Surgeon: Marshell Garfinkel, MD;  Location: [facility];  Service: Cardiopulmonary;  Laterality: Bilateral;    Current Medications: No outpatient medications have been marked as taking for the 09/18/21 encounter (Appointment) with Imogene Burn, PA-C.     Allergies:   Influenza vaccines, Iodinated contrast media, Iodine-131, Iohexol, Levofloxacin, Nucynta [tapentadol hydrochloride], Pregabalin, Sulfonamide derivatives, Vantin  [cefpodoxime], Vilazodone hcl, Amoxicillin, Biaxin [clarithromycin], Carbamazepine, Ciprofloxacin, Dilaudid [hydromorphone hcl], Iodides, and Percocet [oxycodone-acetaminophen]   Social History   Socioeconomic History   Marital status: Divorced    Spouse name: Not on file   Number of children: Not on file   Years of education: Not on file   Highest education level: Not on file  Occupational History   Occupation: disabled  Tobacco Use   Smoking status: Former    Packs/day: 0.10    Years: 3.00    Pack years: 0.30    Types: Cigarettes    Quit date: 2010    Years since quitting: 13.1   Smokeless tobacco: Never   Tobacco comments:    only smokes in high school then every now and then  Vaping Use   Vaping Use: Never used  Substance and Sexual Activity   Alcohol use: No   Drug use: No   Sexual activity: Never  Other Topics Concern   Not on file  Social History Narrative   Lives with daughter in a one story house   Right Handed   Drinks 2 cups caffeine daily   Social Determinants of Health   Financial Resource Strain: Not on file  Food Insecurity: Not on file  Transportation Needs: No Transportation Needs   Lack of Transportation (Medical): No   Lack of Transportation (Non-Medical): No  Physical Activity: Not on file  Stress: Not on file  Social Connections: Not on file     Family History:  The patient's ***family history includes Breast cancer in her cousin, cousin, and maternal aunt; Coronary artery disease in her father; Diabetes in her brother and brother.   ROS:   Please see the history of present illness.    ROS All other systems reviewed and are negative.   PHYSICAL EXAM:   VS:  There were no vitals taken for this visit.  Physical Exam  GEN: Well nourished, well developed, in no acute distress  HEENT: normal  Neck: no JVD, carotid bruits, or masses Cardiac:RRR; no murmurs, rubs, or gallops  Respiratory:  clear to auscultation bilaterally, normal work of  breathing GI: soft, nontender, nondistended, + BS Ext: without cyanosis, clubbing, or edema, Good distal pulses bilaterally MS: no deformity or atrophy  Skin: warm and dry, no rash Neuro:  Alert and Oriented x 3, Strength and sensation are intact Psych: euthymic mood, full affect  Wt Readings from Last 3 Encounters:  09/06/21 189 lb 6.4 oz (85.9 kg)  04/21/21 194 lb (88 kg)  03/06/21 194 lb (88 kg)      Studies/Labs Reviewed:   EKG:  EKG is*** ordered today.  The ekg ordered today demonstrates ***  Recent Labs: No results found for requested labs within last 8760 hours.   Lipid Panel    Component Value Date/Time   CHOL  217 (H) 03/24/2020 0428   TRIG 136 03/24/2020 0428   HDL 82 03/24/2020 0428   CHOLHDL 2.6 03/24/2020 0428   VLDL 27 03/24/2020 0428   LDLCALC 108 (H) 03/24/2020 0428    Additional studies/ records that were reviewed today include:  Procedures [facility] 07/2017   RIGHT/LEFT HEART CATH AND CORONARY ANGIOGRAPHY  Conclusion   Normal coronary arteries.  Right dominant coronary anatomy. Normal right heart hemodynamics and pressures. Normal left ventricular function with ejection fraction 65%.  Normal hemodynamics.      Left Heart   Left Ventricle The left ventricular size is normal. The left ventricular systolic function is normal. LV end diastolic pressure is normal. The left ventricular ejection fraction is greater than 65% by visual estimate. No regional wall motion abnormalities. There is no evidence of mitral regurgitation.  Coronary Diagrams   Diagnostic Dominance: Right Intervention       2D Echo 2016   Study Conclusions   - Left ventricle: The cavity size was normal. Systolic function was   normal. The estimated ejection fraction was in the range of 60%   to 65%. Wall motion was normal; there were no regional wall   motion abnormalities. Doppler parameters are consistent with   abnormal left ventricular relaxation (grade 1 diastolic    dysfunction). - Left atrium: The atrium was mildly dilated. - Right ventricle: Systolic function was normal. - Right atrium: The atrium was normal in size. - Tricuspid valve: Structurally normal valve. There was trivial   regurgitation. - Pulmonic valve: There was trivial regurgitation. - Pulmonary arteries: Systolic pressure was within the normal   range. - Inferior vena cava: The vessel was normal in size. - Pericardium, extracardiac: There was no pericardial effusion.   Impressions:   - Normal biventricular size and systolic function.   Abnormal relaxation, normal filling pressures.   No significant valvular abnormalities.       Risk Assessment/Calculations:   {Does this patient have ATRIAL FIBRILLATION?:[id number]}     ASSESSMENT:    No diagnosis found.   PLAN:  In order of problems listed above:  CVA:  Not likely embolic f/u Dr Leonie Man continue Plavix ILR with no PAF noted with nightly transmission TEE with no SOE and negative bubble  DM:  Discussed low carb diet.  Target hemoglobin A1c is 6.5 or less.  Continue current medications.  HTN:  Well controlled.  Continue current medications and low sodium Dash type diet.     HLD  On lipitor f/u labs with primary     Shared Decision Making/Informed Consent   {Are you ordering a CV Procedure (e.g. stress test, cath, DCCV, TEE, etc)?   Press F2        :[id number]}    Medication Adjustments/Labs and Tests Ordered: Current medicines are reviewed at length with the patient today.  Concerns regarding medicines are outlined above.  Medication changes, Labs and Tests ordered today are listed in the Patient Instructions below. There are no Patient Instructions on file for this visit.   [name] Boast, PA-C  09/10/2021 10:30 AM    [facility] [address], [place], [place]  [address] Phone: [phone]; Fax: [phone]

## 2021-09-10 NOTE — Telephone Encounter (Signed)
Ok to refill Trelegy 3 month supply, refill x 3

## 2021-09-18 ENCOUNTER — Ambulatory Visit: Payer: Medicare HMO | Admitting: Physician Assistant

## 2021-09-20 DIAGNOSIS — E669 Obesity, unspecified: Secondary | ICD-10-CM | POA: Diagnosis not present

## 2021-09-20 DIAGNOSIS — Z0000 Encounter for general adult medical examination without abnormal findings: Secondary | ICD-10-CM | POA: Diagnosis not present

## 2021-09-20 DIAGNOSIS — K219 Gastro-esophageal reflux disease without esophagitis: Secondary | ICD-10-CM | POA: Diagnosis not present

## 2021-09-20 DIAGNOSIS — E039 Hypothyroidism, unspecified: Secondary | ICD-10-CM | POA: Diagnosis not present

## 2021-09-20 DIAGNOSIS — I639 Cerebral infarction, unspecified: Secondary | ICD-10-CM | POA: Diagnosis not present

## 2021-09-20 DIAGNOSIS — E1142 Type 2 diabetes mellitus with diabetic polyneuropathy: Secondary | ICD-10-CM | POA: Diagnosis not present

## 2021-09-20 DIAGNOSIS — F418 Other specified anxiety disorders: Secondary | ICD-10-CM | POA: Diagnosis not present

## 2021-09-20 DIAGNOSIS — K7581 Nonalcoholic steatohepatitis (NASH): Secondary | ICD-10-CM | POA: Diagnosis not present

## 2021-09-20 DIAGNOSIS — I10 Essential (primary) hypertension: Secondary | ICD-10-CM | POA: Diagnosis not present

## 2021-09-23 ENCOUNTER — Other Ambulatory Visit: Payer: Self-pay | Admitting: Cardiovascular Disease

## 2021-09-30 ENCOUNTER — Ambulatory Visit (INDEPENDENT_AMBULATORY_CARE_PROVIDER_SITE_OTHER): Payer: Medicare HMO

## 2021-09-30 DIAGNOSIS — I6300 Cerebral infarction due to thrombosis of unspecified precerebral artery: Secondary | ICD-10-CM | POA: Diagnosis not present

## 2021-09-30 LAB — CUP PACEART REMOTE DEVICE CHECK
Date Time Interrogation Session: 20230303230902
Implantable Pulse Generator Implant Date: 20210831

## 2021-10-01 ENCOUNTER — Other Ambulatory Visit: Payer: Self-pay

## 2021-10-01 NOTE — Patient Outreach (Signed)
[name]) Care Management ? ?10/01/2021 ? ?Cheryl [name] ?11-14-1965 ?[id number] ? ? ?Telephone call to patient for diabetes management.  Patient A1c 10.7.  Stressed importance of diabetes management.  She verbalized understanding.  No concerns.   ? ?Care Plan : RN Care Manager Plan of Care  ?Updates made by Jon Billings, RN since 10/01/2021 12:00 AM  ?  ? ?Problem: Chronic disease management for Diabetes   ?Priority: High  ?  ? ?Long-Range Goal: Development of plan of care related to diabetes management.   ?Start Date: 07/03/2021  ?Expected End Date: 07/27/2022  ?This Visit's Progress: On track  ?Priority: High  ?Note:   ?Current Barriers:  ?Chronic Disease Management support and education needs related to DMII  ? ?RNCM Clinical Goal(s):  ?Patient will verbalize basic understanding of  DMII disease process and self health management plan as evidenced by Patient reporting keeping blood sugars less than 300 ?continue to work with RN Care Manager to address care management and care coordination needs related to  DMII as evidenced by adherence to CM Team Scheduled appointments through collaboration with RN Care manager, provider, and care team.  ? ?Interventions: ?Education and support related to diabetes management ?Inter-disciplinary care team collaboration (see longitudinal plan of care) ?Evaluation of current treatment plan related to  self management and patient's adherence to plan as established by provider ? ? ?Diabetes Interventions:  (Status:  New goal.) Long Term Goal ?Assessed patient's understanding of A1c goal:  9.5 current goal for patient ?Provided education to patient about basic DM disease process ?Discussed plans with patient for ongoing care management follow up and provided patient with direct contact information for care management team ?Lab Results  ?Component Value Date  ? HGBA1C 7.6 (H) 03/25/2020  ?10/01/21 Patient last A1c 10.7.  Discussed with patient carbohydrate counts  and how important it is constantly.  Patient waiting for lancets to check her blood sugar.  She reports not checking her sugars in about one week.  ? ?Patient Goals/Self-Care Activities: Diabetes ?Take all medications as prescribed ?check blood sugar at prescribed times: three times daily ?take the blood sugar log to all doctor visits ?limit fast food meals to no more than 1 per week ?manage portion size ?prepare main meal at home 3 to 5 days each week ?Eat regular meals ? ?Follow Up Plan:  Telephone follow up appointment with care management team member scheduled for:  June ?The patient has been provided with contact information for the care management team and has been advised to call with any health related questions or concerns.  ? ?  ? ?Plan: Follow-up: Patient agrees to Care Plan and Follow-up. ?Follow-up in 3 months. ? ?[name], RN, MSN ?[facility] Care Management ?Care Management Coordinator ?Direct Line [phone] ?Toll Free: [phone]  ?Fax: [phone] ? ? ?

## 2021-10-01 NOTE — Patient Instructions (Signed)
Patient Goals/Self-Care Activities: Diabetes ?Take all medications as prescribed ?check blood sugar at prescribed times: three times daily ?take the blood sugar log to all doctor visits ?limit fast food meals to no more than 1 per week ?manage portion size ?prepare main meal at home 3 to 5 days each week ?Eat regular meals ?

## 2021-10-08 DIAGNOSIS — I10 Essential (primary) hypertension: Secondary | ICD-10-CM | POA: Diagnosis not present

## 2021-10-08 DIAGNOSIS — E782 Mixed hyperlipidemia: Secondary | ICD-10-CM | POA: Diagnosis not present

## 2021-10-08 DIAGNOSIS — E1142 Type 2 diabetes mellitus with diabetic polyneuropathy: Secondary | ICD-10-CM | POA: Diagnosis not present

## 2021-10-08 DIAGNOSIS — G8929 Other chronic pain: Secondary | ICD-10-CM | POA: Diagnosis not present

## 2021-10-08 NOTE — Progress Notes (Signed)
Carelink Summary Report / Loop Recorder 

## 2021-10-10 DIAGNOSIS — C441122 Basal cell carcinoma of skin of right lower eyelid, including canthus: Secondary | ICD-10-CM | POA: Diagnosis not present

## 2021-10-10 DIAGNOSIS — H2513 Age-related nuclear cataract, bilateral: Secondary | ICD-10-CM | POA: Diagnosis not present

## 2021-10-24 DIAGNOSIS — E039 Hypothyroidism, unspecified: Secondary | ICD-10-CM | POA: Diagnosis not present

## 2021-10-24 DIAGNOSIS — I10 Essential (primary) hypertension: Secondary | ICD-10-CM | POA: Diagnosis not present

## 2021-10-24 DIAGNOSIS — E1142 Type 2 diabetes mellitus with diabetic polyneuropathy: Secondary | ICD-10-CM | POA: Diagnosis not present

## 2021-10-24 DIAGNOSIS — E785 Hyperlipidemia, unspecified: Secondary | ICD-10-CM | POA: Diagnosis not present

## 2021-10-29 DIAGNOSIS — C441122 Basal cell carcinoma of skin of right lower eyelid, including canthus: Secondary | ICD-10-CM | POA: Diagnosis not present

## 2021-10-30 ENCOUNTER — Other Ambulatory Visit: Payer: Self-pay

## 2021-10-30 DIAGNOSIS — S0181XS Laceration without foreign body of other part of head, sequela: Secondary | ICD-10-CM | POA: Diagnosis not present

## 2021-10-30 DIAGNOSIS — C441122 Basal cell carcinoma of skin of right lower eyelid, including canthus: Secondary | ICD-10-CM | POA: Diagnosis not present

## 2021-10-30 DIAGNOSIS — C441121 Basal cell carcinoma of skin of right upper eyelid, including canthus: Secondary | ICD-10-CM | POA: Diagnosis not present

## 2021-10-30 DIAGNOSIS — Z481 Encounter for planned postprocedural wound closure: Secondary | ICD-10-CM | POA: Diagnosis not present

## 2021-10-30 DIAGNOSIS — Z85828 Personal history of other malignant neoplasm of skin: Secondary | ICD-10-CM | POA: Diagnosis not present

## 2021-10-30 DIAGNOSIS — Z0389 Encounter for observation for other suspected diseases and conditions ruled out: Secondary | ICD-10-CM | POA: Diagnosis not present

## 2021-10-30 DIAGNOSIS — Z483 Aftercare following surgery for neoplasm: Secondary | ICD-10-CM | POA: Diagnosis not present

## 2021-10-30 DIAGNOSIS — C441191 Basal cell carcinoma of skin of left upper eyelid, including canthus: Secondary | ICD-10-CM | POA: Diagnosis not present

## 2021-10-30 DIAGNOSIS — H2513 Age-related nuclear cataract, bilateral: Secondary | ICD-10-CM | POA: Diagnosis not present

## 2021-10-30 DIAGNOSIS — S01412S Laceration without foreign body of left cheek and temporomandibular area, sequela: Secondary | ICD-10-CM | POA: Diagnosis not present

## 2021-10-30 DIAGNOSIS — S01411S Laceration without foreign body of right cheek and temporomandibular area, sequela: Secondary | ICD-10-CM | POA: Diagnosis not present

## 2021-11-04 ENCOUNTER — Ambulatory Visit (INDEPENDENT_AMBULATORY_CARE_PROVIDER_SITE_OTHER): Payer: Medicare HMO

## 2021-11-04 DIAGNOSIS — I6300 Cerebral infarction due to thrombosis of unspecified precerebral artery: Secondary | ICD-10-CM | POA: Diagnosis not present

## 2021-11-05 LAB — CUP PACEART REMOTE DEVICE CHECK
Date Time Interrogation Session: 20230405231058
Implantable Pulse Generator Implant Date: 20210831

## 2021-11-15 ENCOUNTER — Other Ambulatory Visit: Payer: Self-pay | Admitting: Cardiovascular Disease

## 2021-11-15 MED ORDER — NEBIVOLOL HCL 10 MG PO TABS: 10.0000 mg | ORAL_TABLET | Freq: Every day | ORAL | 0 refills | Status: DC

## 2021-11-19 DIAGNOSIS — E039 Hypothyroidism, unspecified: Secondary | ICD-10-CM | POA: Diagnosis not present

## 2021-11-19 DIAGNOSIS — I10 Essential (primary) hypertension: Secondary | ICD-10-CM | POA: Diagnosis not present

## 2021-11-19 DIAGNOSIS — Z794 Long term (current) use of insulin: Secondary | ICD-10-CM | POA: Diagnosis not present

## 2021-11-19 DIAGNOSIS — E1142 Type 2 diabetes mellitus with diabetic polyneuropathy: Secondary | ICD-10-CM | POA: Diagnosis not present

## 2021-11-19 NOTE — Progress Notes (Signed)
Carelink Summary Report / Loop Recorder 

## 2021-12-01 ENCOUNTER — Other Ambulatory Visit: Payer: Self-pay | Admitting: Gastroenterology

## 2021-12-06 LAB — CUP PACEART REMOTE DEVICE CHECK
Date Time Interrogation Session: 20230510231229
Implantable Pulse Generator Implant Date: 20210831

## 2021-12-09 ENCOUNTER — Ambulatory Visit (INDEPENDENT_AMBULATORY_CARE_PROVIDER_SITE_OTHER): Payer: Medicare HMO

## 2021-12-09 DIAGNOSIS — I6300 Cerebral infarction due to thrombosis of unspecified precerebral artery: Secondary | ICD-10-CM | POA: Diagnosis not present

## 2021-12-12 ENCOUNTER — Other Ambulatory Visit: Payer: Self-pay | Admitting: Gastroenterology

## 2021-12-24 DIAGNOSIS — J45909 Unspecified asthma, uncomplicated: Secondary | ICD-10-CM | POA: Diagnosis not present

## 2021-12-24 DIAGNOSIS — I10 Essential (primary) hypertension: Secondary | ICD-10-CM | POA: Diagnosis not present

## 2021-12-24 DIAGNOSIS — E039 Hypothyroidism, unspecified: Secondary | ICD-10-CM | POA: Diagnosis not present

## 2021-12-24 DIAGNOSIS — E782 Mixed hyperlipidemia: Secondary | ICD-10-CM | POA: Diagnosis not present

## 2021-12-24 DIAGNOSIS — G8929 Other chronic pain: Secondary | ICD-10-CM | POA: Diagnosis not present

## 2021-12-24 DIAGNOSIS — K219 Gastro-esophageal reflux disease without esophagitis: Secondary | ICD-10-CM | POA: Diagnosis not present

## 2021-12-24 DIAGNOSIS — E1142 Type 2 diabetes mellitus with diabetic polyneuropathy: Secondary | ICD-10-CM | POA: Diagnosis not present

## 2021-12-26 NOTE — Progress Notes (Signed)
Carelink Summary Report / Loop Recorder 

## 2021-12-27 ENCOUNTER — Telehealth: Payer: Self-pay | Admitting: Gastroenterology

## 2021-12-27 MED ORDER — METOCLOPRAMIDE HCL 10 MG PO TABS: 10.0000 mg | ORAL_TABLET | Freq: Three times a day (TID) | ORAL | 0 refills | Status: DC

## 2021-12-27 NOTE — Telephone Encounter (Signed)
Inbound call from patient stating that she needs a refill for Reglan. Patient is requesting it be sent to [facility]. Please advise.

## 2021-12-27 NOTE — Telephone Encounter (Signed)
Refilled Reglan for 30 days only. Patient must keep appointment with Estill Bamberg, [place] on 01/27/22 for any additional refills.

## 2021-12-31 ENCOUNTER — Other Ambulatory Visit: Payer: Self-pay

## 2021-12-31 NOTE — Patient Outreach (Signed)
[place] [place] [facility]) Care Management  12/31/2021  Anays Detore [place]ales March 24, 1966 [id number]   Telephone call to patient for disease management follow up.   No answer.  HIPAA compliant voice message left.    Plan: If no return call, RN CM will attempt patient again in September.  [name], RN, MSN [facility] Care Management Care Management Coordinator Direct Line [phone] Toll Free: [phone]  Fax: [phone]

## 2022-01-13 ENCOUNTER — Ambulatory Visit (INDEPENDENT_AMBULATORY_CARE_PROVIDER_SITE_OTHER): Payer: Medicare HMO

## 2022-01-13 DIAGNOSIS — I6300 Cerebral infarction due to thrombosis of unspecified precerebral artery: Secondary | ICD-10-CM

## 2022-01-13 LAB — CUP PACEART REMOTE DEVICE CHECK
Date Time Interrogation Session: 20230612230755
Implantable Pulse Generator Implant Date: 20210831

## 2022-01-23 DIAGNOSIS — E1142 Type 2 diabetes mellitus with diabetic polyneuropathy: Secondary | ICD-10-CM | POA: Diagnosis not present

## 2022-01-23 DIAGNOSIS — Z794 Long term (current) use of insulin: Secondary | ICD-10-CM | POA: Diagnosis not present

## 2022-01-23 DIAGNOSIS — I10 Essential (primary) hypertension: Secondary | ICD-10-CM | POA: Diagnosis not present

## 2022-01-23 DIAGNOSIS — E039 Hypothyroidism, unspecified: Secondary | ICD-10-CM | POA: Diagnosis not present

## 2022-01-23 NOTE — Progress Notes (Deleted)
01/23/2022 Cheryl [name] [id number] [birth year]  Referring provider: [name], MD Primary GI doctor: [name] Plan  ASSESSMENT AND PLAN:   There are no diagnoses linked to this encounter.  History of Present Illness:  56 y.o. female  with a past medical history of CVA, hypertension, OSA, interstitial lung disease, fatty liver disease, GERD with history of dysphagia, insulin-dependent diabetes , history of melanoma and others listed below, returns to clinic today for medication refill. 10/11/2019 office visit Lajuana Ripple for nausea, early satiety, GERD, dyspepsia, weight loss CT abdomen pelvis with contrast showed hepatic steatosis, hepatic contours and conduit hypertrophy, splenomegaly mild right UPJ obstruction, postcholecystectomy and hysterectomy. Given pantoprazole 40 mg twice daily and Zofran.  Could consider trial of Reglan 12/2017 colonoscopy internal hemorrhoids recall 10 years 04/2018 endoscopy large amount of retained food 2019 GES normal  Patient presents for refill of medication. Is on Victoza. Patient is on Plavix patient is on Reglan and 10 mg 3 times daily. No recent labs  Current Medications:   Current Outpatient Medications (Endocrine & Metabolic):    HUMULIN R U-500 KWIKPEN 500 UNIT/ML kwikpen, Inject 100 Units into the skin daily.    levothyroxine (SYNTHROID, LEVOTHROID) 75 MCG tablet, Take 75 mcg by mouth daily before breakfast.   liraglutide (VICTOZA) 18 MG/3ML SOPN, Inject 1.2 mg into the skin every evening.   Current Outpatient Medications (Cardiovascular):    atorvastatin (LIPITOR) 80 MG tablet, Take 1 tablet (80 mg total) by mouth daily.   diltiazem (CARDIZEM CD) 240 MG 24 hr capsule, TAKE 1 CAPSULE BY MOUTH EVERY MORNING ON AN EMPTY STOMACH - [name] DENIED, FAXED DR Johnsie Cancel 10/22/15 SS (Patient taking differently: Take 240 mg by mouth daily.)   losartan (COZAAR) 25 MG tablet, Take 25 mg by mouth daily.   nebivolol (BYSTOLIC) 10 MG tablet,  Take 1 tablet (10 mg total) by mouth daily. Patient needs appointment for any future refills. Please call [phone] to schedule appointment. 2nd attempt.  Current Outpatient Medications (Respiratory):    albuterol (VENTOLIN HFA) 108 (90 Base) MCG/ACT inhaler, INHALE 2 PUFFS EVERY 6 HOURS AS NEEDED FOR SHORTNESS OF BREATH OR WHEEZING   ipratropium-albuterol (DUONEB) 0.5-2.5 (3) MG/3ML SOLN, Take 21m by nebulization every 4-6 hours if needed (Patient taking differently: Inhale 3 mLs into the lungs every 4 (four) hours as needed (wheezing/shortness of breath.).)   promethazine (PHENERGAN) 25 MG tablet, Take 25 mg by mouth 2 (two) times daily as needed for nausea.   TRELEGY ELLIPTA 100-62.5-25 MCG/ACT AEPB, INHALE 1 PUFF INTO THE LUNGS DAILY.  Current Outpatient Medications (Analgesics):    ibuprofen (ADVIL,MOTRIN) 800 MG tablet, Take 800 mg by mouth every 8 (eight) hours as needed (pain).    rizatriptan (MAXALT) 10 MG tablet, Take 10 mg by mouth daily.   SUMAtriptan (IMITREX) 20 MG/ACT nasal spray, Place 20 mg into the nose every 2 (two) hours as needed for migraine or headache. May repeat in 2 hours if headache persists or recurs.  Current Outpatient Medications (Hematological):    clopidogrel (PLAVIX) 75 MG tablet, TAKE 1 TABLET (75 MG TOTAL) BY MOUTH DAILY.  Current Outpatient Medications (Other):    Cholecalciferol (VITAMIN D3) 1000 units CAPS, Take 1,000 Units by mouth daily.   diazepam (VALIUM) 10 MG tablet, Take 10 mg by mouth 2 (two) times daily as needed (panic attacks.).    FLUoxetine (PROZAC) 40 MG capsule, Take 80 mg by mouth at bedtime.    metoCLOPramide (REGLAN) 10 MG tablet, Take 1 tablet (10  mg total) by mouth 3 (three) times daily before meals.   nystatin-triamcinolone (MYCOLOG II) cream, Apply 1 application topically 2 (two) times daily as needed (yeast in skin folds).    pantoprazole (PROTONIX) 40 MG tablet, TAKE 1 TABLET TWICE DAILY (Patient not taking: Reported on  10/01/2021)   pentosan polysulfate (ELMIRON) 100 MG capsule, Take 100 mg by mouth 3 (three) times daily.   tiZANidine (ZANAFLEX) 4 MG tablet, Take 4 mg by mouth 3 (three) times daily. May hold one dose if needed to operate motor vehicle.  Surgical History:  She  has a past surgical history that includes Abdominal hysterectomy (1993); Cholecystectomy (?1990); Nasal sinus surgery (1986; 1990's); Knee arthroscopy (Left, 1990's); Hip surgery (Right, 2002); Shoulder arthroscopy w/ rotator cuff repair (Right); Anterior cervical decomp/discectomy fusion (2004); Carpal tunnel release (Bilateral, ?1980's); RIGHT/LEFT HEART CATH AND CORONARY ANGIOGRAPHY (N/A, 08/26/2017); Colonoscopy; arm surgery; Bronchoscopy (06/28/2019); Video bronchoscopy (Bilateral, 06/28/2019); TEE without cardioversion (N/A, 03/27/2020); Bubble study (03/27/2020); and LOOP RECORDER INSERTION (N/A, 03/27/2020). Family History:  Her family history includes Breast cancer in her cousin, cousin, and maternal aunt; Coronary artery disease in her father; Diabetes in her brother and brother. Social History:   reports that she quit smoking about 13 years ago. Her smoking use included cigarettes. She has a 0.30 pack-year smoking history. She has never used smokeless tobacco. She reports that she does not drink alcohol and does not use drugs.  Current Medications, Allergies, Past Medical History, Past Surgical History, Family History and Social History were reviewed in [employer] record.  Physical Exam: There were no vitals taken for this visit. General:   Pleasant, well developed female in no acute distress Heart : Regular rate and rhythm; no murmurs Pulm: Clear anteriorly; no wheezing Abdomen:  {BlankSingle:19197::"Distended","Ridged","Soft"}, {BlankSingle:19197::"Flat","Obese","Non-distended"} AB, {BlankSingle:19197::"Absent","Hyperactive, tinkling","Hypoactive","Sluggish","Active"} bowel sounds. {actendernessAB:27319}  tenderness {anatomy; site abdomen:5010}. {BlankMultiple:19196::"Without guarding","With guarding","Without rebound","With rebound"}, No organomegaly appreciated. Rectal: {acrectalexam:27461} Extremities:  {With/without:5700}  edema. Neurologic:  Alert and  oriented x4;  No focal deficits.  Psych:  Cooperative. Normal mood and affect.   Vladimir Crofts, PA-C 01/23/22

## 2022-01-27 ENCOUNTER — Ambulatory Visit: Payer: Medicare HMO | Admitting: Physician Assistant

## 2022-01-27 DIAGNOSIS — E119 Type 2 diabetes mellitus without complications: Secondary | ICD-10-CM

## 2022-01-27 DIAGNOSIS — K219 Gastro-esophageal reflux disease without esophagitis: Secondary | ICD-10-CM

## 2022-01-27 DIAGNOSIS — K760 Fatty (change of) liver, not elsewhere classified: Secondary | ICD-10-CM

## 2022-01-30 NOTE — Progress Notes (Signed)
Carelink Summary Report / Loop Recorder 

## 2022-02-11 ENCOUNTER — Other Ambulatory Visit: Payer: Self-pay

## 2022-02-11 NOTE — Patient Outreach (Signed)
[place] [facility]) Care Management  02/11/2022  Adryanna Friedt [place] 1966/03/10 [id number]   Telephone call to patient for case closure. Patient reports doing fine.  Will close case.    Care Plan : RN Care Manager Plan of Care  Updates made by Jon Billings, RN since 02/11/2022 12:00 AM  Completed 02/11/2022   Problem: Chronic disease management for Diabetes Resolved 02/11/2022  Priority: High     Long-Range Goal: Development of plan of care related to diabetes management. Completed 02/11/2022  Start Date: 07/03/2021  Expected End Date: 07/27/2022  Recent Progress: On track  Priority: High  Note:   Current Barriers:  Chronic Disease Management support and education needs related to DMII   RNCM Clinical Goal(s):  Patient will verbalize basic understanding of  DMII disease process and self health management plan as evidenced by Patient reporting keeping blood sugars less than 300 continue to work with RN Care Manager to address care management and care coordination needs related to  DMII as evidenced by adherence to CM Team Scheduled appointments through collaboration with RN Care manager, provider, and care team.   Interventions: Education and support related to diabetes management Inter-disciplinary care team collaboration (see longitudinal plan of care) Evaluation of current treatment plan related to  self management and patient's adherence to plan as established by provider   Diabetes Interventions:  (Status:  New goal.) Long Term Goal Assessed patient's understanding of A1c goal:  9.5 current goal for patient Provided education to patient about basic DM disease process Discussed plans with patient for ongoing care management follow up and provided patient with direct contact information for care management team Lab Results  Component Value Date   HGBA1C 7.6 (H) 03/25/2020  10/01/21 Patient last A1c 10.7.  Discussed with patient carbohydrate counts and how important  it is constantly.  Patient waiting for lancets to check her blood sugar.  She reports not checking her sugars in about one week.   02/11/22 Patient managing health with no recent hospitalizations.  Will  close case.  Patient Goals/Self-Care Activities: Diabetes Take all medications as prescribed check blood sugar at prescribed times: three times daily take the blood sugar log to all doctor visits limit fast food meals to no more than 1 per week manage portion size prepare main meal at home 3 to 5 days each week Eat regular meals  Follow Up Plan: closing case.    Plan: Closing case.  [name], RN, MSN [facility] Care Management Care Management Coordinator Direct Line [phone] Toll Free: [phone]  Fax: [phone]

## 2022-02-16 ENCOUNTER — Other Ambulatory Visit: Payer: Self-pay | Admitting: Internal Medicine

## 2022-02-16 ENCOUNTER — Other Ambulatory Visit: Payer: Self-pay | Admitting: Neurology

## 2022-02-16 LAB — CUP PACEART REMOTE DEVICE CHECK
Date Time Interrogation Session: 20230715230459
Implantable Pulse Generator Implant Date: 20210831

## 2022-02-17 ENCOUNTER — Ambulatory Visit (INDEPENDENT_AMBULATORY_CARE_PROVIDER_SITE_OTHER): Payer: Medicare HMO

## 2022-02-17 DIAGNOSIS — I6300 Cerebral infarction due to thrombosis of unspecified precerebral artery: Secondary | ICD-10-CM

## 2022-03-05 NOTE — Progress Notes (Unsigned)
03/05/2022 Cheryl [name] [id number] [birth year]  Referring provider: [name], MD Primary GI doctor: [name] Plan  ASSESSMENT AND PLAN:   There are no diagnoses linked to this encounter. 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 female  with a past medical history of diabetic, melanoma history, anxiety, degenerative disc disease cervical and lumbar, fatty liver, migraine, ICS, OSA, fibromyalgia, GERD, gastroparesis related to diabetes and chronic narcotic use and others listed below, returns to clinic today for evaluation of ***. 04/2018 endoscopy large amount of retained food Subsequent GES unremarkable 10/11/2019 office visit with [name] for chronic nausea. 10/21/2019 CT abdomen pelvis with contrast showed hepatic steatosis with hypertrophy medicate liver disease, splenomegaly, postcholecystectomy and hysterectomy, UPJ type obstruction 02/2020 acute/subacute ganglion stroke, following [name].  Had modified barium swallow MBS was unremarkable, no signs of aspiration 04/16/2020 abdominal ultrasound with elastography showed median K PA of 2.7 high probability of being normal.  Shows diffuse echotexture compatible with fatty liver  Current Medications:   Current Outpatient Medications (Endocrine & Metabolic):    HUMULIN R U-500 KWIKPEN 500 UNIT/ML kwikpen, Inject 100 Units into the skin daily.    levothyroxine (SYNTHROID, LEVOTHROID) 75 MCG tablet, Take 75 mcg by mouth daily before breakfast.   liraglutide (VICTOZA) 18 MG/3ML SOPN, Inject 1.2 mg into the skin every evening.   Current Outpatient Medications (Cardiovascular):    atorvastatin (LIPITOR) 80 MG tablet, Take 1 tablet (80 mg total) by mouth daily.   diltiazem (CARDIZEM CD) 240 MG 24 hr  capsule, TAKE 1 CAPSULE BY MOUTH EVERY MORNING ON AN EMPTY STOMACH - [name] DENIED, FAXED DR Johnsie Cancel 10/22/15 SS (Patient taking differently: Take 240 mg by mouth daily.)   losartan (COZAAR) 25 MG tablet, Take 25 mg by mouth daily.   nebivolol (BYSTOLIC) 10 MG tablet, Take 1 tablet (10 mg total) by mouth daily. Patient needs appointment for any future refills. Please call [phone] to schedule appointment. 2nd attempt.  Current Outpatient Medications (Respiratory):    albuterol (VENTOLIN HFA) 108 (90 Base) MCG/ACT inhaler, INHALE 2 PUFFS EVERY 6 HOURS AS NEEDED FOR SHORTNESS OF BREATH OR WHEEZING   Fluticasone-Umeclidin-Vilant (TRELEGY ELLIPTA) 100-62.5-25 MCG/ACT AEPB, INHALE 1 PUFF INTO THE LUNGS DAILY.   ipratropium-albuterol (DUONEB) 0.5-2.5 (3) MG/3ML SOLN, Take 70m by nebulization every 4-6 hours if needed (Patient taking differently: Inhale 3 mLs into the lungs every 4 (four) hours as needed (wheezing/shortness of breath.).)   promethazine (PHENERGAN) 25 MG tablet, Take 25 mg by mouth 2 (two) times daily as needed for nausea.  Current Outpatient Medications (Analgesics):    ibuprofen (ADVIL,MOTRIN) 800 MG tablet, Take 800 mg by mouth every 8 (eight) hours as needed (pain).    rizatriptan (MAXALT) 10 MG tablet, Take 10 mg by mouth daily.   SUMAtriptan (IMITREX) 20 MG/ACT nasal spray, Place 20 mg into the nose every 2 (two) hours as needed for migraine or headache. May repeat in 2 hours if headache persists or recurs.  Current Outpatient Medications (Hematological):    clopidogrel (PLAVIX) 75 MG tablet, TAKE 1 TABLET (75 MG TOTAL) BY MOUTH DAILY.  Current Outpatient Medications (Other):    Cholecalciferol (VITAMIN D3) 1000 units CAPS, Take 1,000 Units by mouth daily.   diazepam (VALIUM) 10 MG tablet, Take 10 mg by mouth 2 (two) times daily as  needed (panic attacks.).    FLUoxetine (PROZAC) 40 MG capsule, Take 80 mg by mouth at bedtime.    metoCLOPramide (REGLAN) 10 MG tablet, Take 1  tablet (10 mg total) by mouth 3 (three) times daily before meals.   nystatin-triamcinolone (MYCOLOG II) cream, Apply 1 application topically 2 (two) times daily as needed (yeast in skin folds).    pantoprazole (PROTONIX) 40 MG tablet, TAKE 1 TABLET TWICE DAILY (Patient not taking: Reported on 10/01/2021)   pentosan polysulfate (ELMIRON) 100 MG capsule, Take 100 mg by mouth 3 (three) times daily.   tiZANidine (ZANAFLEX) 4 MG tablet, Take 4 mg by mouth 3 (three) times daily. May hold one dose if needed to operate motor vehicle.  Surgical History:  She  has a past surgical history that includes Abdominal hysterectomy (1993); Cholecystectomy (?1990); Nasal sinus surgery (1986; 1990's); Knee arthroscopy (Left, 1990's); Hip surgery (Right, 2002); Shoulder arthroscopy w/ rotator cuff repair (Right); Anterior cervical decomp/discectomy fusion (2004); Carpal tunnel release (Bilateral, ?1980's); RIGHT/LEFT HEART CATH AND CORONARY ANGIOGRAPHY (N/A, 08/26/2017); Colonoscopy; arm surgery; Bronchoscopy (06/28/2019); Video bronchoscopy (Bilateral, 06/28/2019); TEE without cardioversion (N/A, 03/27/2020); Bubble study (03/27/2020); and LOOP RECORDER INSERTION (N/A, 03/27/2020). Family History:  Her family history includes Breast cancer in her cousin, cousin, and maternal aunt; Coronary artery disease in her father; Diabetes in her brother and brother. Social History:   reports that she quit smoking about 13 years ago. Her smoking use included cigarettes. She has a 0.30 pack-year smoking history. She has never used smokeless tobacco. She reports that she does not drink alcohol and does not use drugs.  Current Medications, Allergies, Past Medical History, Past Surgical History, Family History and Social History were reviewed in [employer] record.  Physical Exam: There were no vitals taken for this visit. General:   Pleasant, well developed female in no acute distress Heart : Regular rate and rhythm;  no murmurs Pulm: Clear anteriorly; no wheezing Abdomen:  {BlankSingle:19197::"Distended","Ridged","Soft"}, {BlankSingle:19197::"Flat","Obese","Non-distended"} AB, {BlankSingle:19197::"Absent","Hyperactive, tinkling","Hypoactive","Sluggish","Active"} bowel sounds. {actendernessAB:27319} tenderness {anatomy; site abdomen:5010}. {BlankMultiple:19196::"Without guarding","With guarding","Without rebound","With rebound"}, No organomegaly appreciated. Rectal: {acrectalexam:27461} Extremities:  {With/without:5700}  edema. Neurologic:  Alert and  oriented x4;  No focal deficits.  Psych:  Cooperative. Normal mood and affect.   Vladimir Crofts, PA-C 03/05/22

## 2022-03-06 ENCOUNTER — Ambulatory Visit: Payer: Medicare HMO | Admitting: Physician Assistant

## 2022-03-06 ENCOUNTER — Encounter: Payer: Self-pay | Admitting: Physician Assistant

## 2022-03-06 VITALS — BP 160/90 | HR 85 | Ht 62.0 in | Wt 192.0 lb

## 2022-03-06 DIAGNOSIS — K5904 Chronic idiopathic constipation: Secondary | ICD-10-CM | POA: Diagnosis not present

## 2022-03-06 DIAGNOSIS — K760 Fatty (change of) liver, not elsewhere classified: Secondary | ICD-10-CM

## 2022-03-06 DIAGNOSIS — K3184 Gastroparesis: Secondary | ICD-10-CM

## 2022-03-06 DIAGNOSIS — I10 Essential (primary) hypertension: Secondary | ICD-10-CM | POA: Diagnosis not present

## 2022-03-06 DIAGNOSIS — I63512 Cerebral infarction due to unspecified occlusion or stenosis of left middle cerebral artery: Secondary | ICD-10-CM

## 2022-03-06 DIAGNOSIS — K219 Gastro-esophageal reflux disease without esophagitis: Secondary | ICD-10-CM

## 2022-03-06 MED ORDER — PANTOPRAZOLE SODIUM 40 MG PO TBEC: 40.0000 mg | DELAYED_RELEASE_TABLET | Freq: Two times a day (BID) | ORAL | 3 refills | Status: DC

## 2022-03-06 MED ORDER — PROMETHAZINE HCL 25 MG PO TABS: 25.0000 mg | ORAL_TABLET | Freq: Two times a day (BID) | ORAL | 0 refills | Status: DC | PRN

## 2022-03-06 MED ORDER — METOCLOPRAMIDE HCL 10 MG PO TABS: 10.0000 mg | ORAL_TABLET | Freq: Three times a day (TID) | ORAL | 1 refills | Status: DC | PRN

## 2022-03-06 NOTE — Patient Instructions (Addendum)
Gastroparesis Please do small frequent meals like 4-6 meals a day.  Eat and drink liquids at separate times.  Avoid high fiber foods, cook your vegetables, avoid high fat food.  Suggest spreading protein throughout the day (greek yogurt, glucerna, soft meat, milk, eggs) Choose soft foods that you can mash with a fork When you are more symptomatic, change to pureed foods foods and liquids.  Consider reading "Living well with Gastroparesis" by Crystal Zaborrowski CONSIDER SWITCHING OFF OZEMPIC IF WORSENING SYMPTOMS- CAN CAUSE CONSTIPATION AND NAUSEA Gastroparesis is a condition in which food takes longer than normal to empty from the stomach. This condition is also known as delayed gastric emptying. It is usually a long-term (chronic) condition. There is no cure, but there are treatments and things that you can do at home to help relieve symptoms. Treating the underlying condition that causes gastroparesis can also help relieve symptoms What are the causes? In many cases, the cause of this condition is not known. Possible causes include: A hormone (endocrine) disorder, such as hypothyroidism or diabetes. A nervous system disease, such as Parkinson's disease or multiple sclerosis. Cancer, infection, or surgery that affects the stomach or vagus nerve. The vagus nerve runs from your chest, through your neck, and to the lower part of your brain. A connective tissue disorder, such as scleroderma. Certain medicines. What increases the risk? You are more likely to develop this condition if: You have certain disorders or diseases. These may include: An endocrine disorder. An eating disorder. Amyloidosis. Scleroderma. Parkinson's disease. Multiple sclerosis. Cancer or infection of the stomach or the vagus nerve. You have had surgery on your stomach or vagus nerve. You take certain medicines. You are female. What are the signs or symptoms? Symptoms of this condition include: Feeling full after  eating very little or a loss of appetite. Nausea, vomiting, or heartburn. Bloating of your abdomen. Inconsistent blood sugar (glucose) levels on blood tests. Unexplained weight loss. Acid from the stomach coming up into the esophagus (gastroesophageal reflux). Sudden tightening (spasm) of the stomach, which can be painful. Symptoms may come and go. Some people may not notice any symptoms. How is this diagnosed? This condition is diagnosed with tests, such as: Tests that check how long it takes food to move through the stomach and intestines. These tests include: Upper gastrointestinal (GI) series. For this test, you drink a liquid that shows up well on X-rays, and then X-rays are taken of your intestines. Gastric emptying scintigraphy. For this test, you eat food that contains a small amount of radioactive material, and then scans are taken. Wireless capsule GI monitoring system. For this test, you swallow a pill (capsule) that records information about how foods and fluid move through your stomach. Gastric manometry. For this test, a tube is passed down your throat and into your stomach to measure electrical and muscular activity. Endoscopy. For this test, a long, thin tube with a camera and light on the end is passed down your throat and into your stomach to check for problems in your stomach lining. Ultrasound. This test uses sound waves to create images of the inside of your body. This can help rule out gallbladder disease or pancreatitis as a cause of your symptoms. How is this treated? There is no cure for this condition, but treatment and home care may relieve symptoms. Treatment may include: Treating the underlying cause. Managing your symptoms by making changes to your diet and exercise habits. Taking medicines to control nausea and vomiting and to stimulate  stomach muscles. Getting food through a feeding tube in the hospital. This may be done in severe cases. Having surgery to insert  a device called a gastric electrical stimulator into your body. This device helps improve stomach emptying and control nausea and vomiting. Follow these instructions at home: Take over-the-counter and prescription medicines only as told by your health care provider. Follow instructions from your health care provider about eating or drinking restrictions. Your health care provider may recommend that you: Eat smaller meals more often. Eat low-fat foods. Eat low-fiber forms of high-fiber foods. For example, eat cooked vegetables instead of raw vegetables. Have only liquid foods instead of solid foods. Liquid foods are easier to digest. Drink enough fluid to keep your urine pale yellow. Exercise as often as told by your health care provider. Keep all follow-up visits. This is important. Contact a health care provider if you: Notice that your symptoms do not improve with treatment. Have new symptoms. Get help right away if you: Have severe pain in your abdomen that does not improve with treatment. Have nausea that is severe or does not go away. Vomit every time you drink fluids. Summary Gastroparesis is a long-term (chronic) condition in which food takes longer than normal to empty from the stomach. Symptoms include nausea, vomiting, heartburn, bloating of your abdomen, and loss of appetite. Eating smaller portions, low-fat foods, and low-fiber forms of high-fiber foods may help you manage your symptoms. Get help right away if you have severe pain in your abdomen. This information is not intended to replace advice given to you by your health care provider. Make sure you discuss any questions you have with your health care provider. Document Revised: 11/21/2019 Document Reviewed: 11/21/2019 Elsevier Patient Education  2021 [place] or metoclopramide  Can be used for gastroparesis or slow stomach, nausea, vomiting, GERD.   Continue the medication as needed up to 3 times a day,  on an empty stomach 30 minutes before eating. It may take a few weeks for your stomach condition to start to get better.  The longer you take this medicine, and the more you take it, the greater your chances are of developing serious side effects.  Some people may get a severe muscle problem called tardive dyskinesia. This problem may lessen or go away after stopping this drug, but it may not go away. The risk is greater with diabetes and in older adults, especially older females. The risk is greater with longer use or higher doses, but it may also occur after short-term use with low doses. Call your doctor right away if you have trouble controlling body movements or problems with your tongue, face, mouth, or jaw like tongue sticking out, puffing cheeks, mouth puckering, or chewing.   Please monitor for worsening depression or thoughts of suicide, any aggressiveness or hyperactivity.  If this happen please stop and call your physician right away.  Miralax is an osmotic laxative.  It only brings more water into the stool.  This is safe to take daily.  Can take up to 17 gram of miralax twice a day.  Mix with juice or coffee.  Start 1 capful at night for 3-4 days and reassess your response in 3-4 days.  You can increase and decrease the dose based on your response.  Remember, it can take up to 3-4 days to take effect OR for the effects to wear off.   I often pair this with benefiber in the morning to help assure  the stool is not too loose.  If this does not help can do motegrity versus linzess for constipation.  Toileting tips to help with your constipation - Drink at least 64-80 ounces of water/liquid per day. - Establish a time to try to move your bowels every day.  For many people, this is after a cup of coffee or after a meal such as breakfast. - Sit all of the way back on the toilet keeping your back fairly straight and while sitting up, try to rest the tops of your forearms on your upper  thighs.   - Raising your feet with a step stool/squatty potty can be helpful to improve the angle that allows your stool to pass through the rectum. - Relax the rectum feeling it bulge toward the toilet water.  If you feel your rectum raising toward your body, you are contracting rather than relaxing. - Breathe in and slowly exhale. "Belly breath" by expanding your belly towards your belly button. Keep belly expanded as you gently direct pressure down and back to the anus.  A low pitched GRRR sound can assist with increasing intra-abdominal pressure.  - Repeat 3-4 times. If unsuccessful, contract the pelvic floor to restore normal tone and get off the toilet.  Avoid excessive straining. - To reduce excessive wiping by teaching your anus to normally contract, place hands on outer aspect of knees and resist knee movement outward.  Hold 5-10 second then place hands just inside of knees and resist inward movement of knees.  Hold 5 seconds.  Repeat a few times each way.

## 2022-03-18 DIAGNOSIS — I10 Essential (primary) hypertension: Secondary | ICD-10-CM | POA: Diagnosis not present

## 2022-03-19 NOTE — Progress Notes (Signed)
Carelink Summary Report / Loop Recorder 

## 2022-03-23 LAB — CUP PACEART REMOTE DEVICE CHECK
Date Time Interrogation Session: 20230817230351
Implantable Pulse Generator Implant Date: 20210831

## 2022-03-24 ENCOUNTER — Ambulatory Visit (INDEPENDENT_AMBULATORY_CARE_PROVIDER_SITE_OTHER): Payer: Medicare HMO

## 2022-03-24 DIAGNOSIS — I6300 Cerebral infarction due to thrombosis of unspecified precerebral artery: Secondary | ICD-10-CM | POA: Diagnosis not present

## 2022-04-03 ENCOUNTER — Ambulatory Visit: Payer: Self-pay

## 2022-04-08 ENCOUNTER — Other Ambulatory Visit: Payer: Self-pay | Admitting: Cardiovascular Disease

## 2022-04-16 DIAGNOSIS — I10 Essential (primary) hypertension: Secondary | ICD-10-CM | POA: Diagnosis not present

## 2022-04-16 DIAGNOSIS — E039 Hypothyroidism, unspecified: Secondary | ICD-10-CM | POA: Diagnosis not present

## 2022-04-16 DIAGNOSIS — E1142 Type 2 diabetes mellitus with diabetic polyneuropathy: Secondary | ICD-10-CM | POA: Diagnosis not present

## 2022-04-16 DIAGNOSIS — Z794 Long term (current) use of insulin: Secondary | ICD-10-CM | POA: Diagnosis not present

## 2022-04-16 NOTE — Progress Notes (Signed)
Carelink Summary Report / Loop Recorder 

## 2022-04-18 DIAGNOSIS — G894 Chronic pain syndrome: Secondary | ICD-10-CM | POA: Diagnosis not present

## 2022-04-18 DIAGNOSIS — E782 Mixed hyperlipidemia: Secondary | ICD-10-CM | POA: Diagnosis not present

## 2022-04-18 DIAGNOSIS — E039 Hypothyroidism, unspecified: Secondary | ICD-10-CM | POA: Diagnosis not present

## 2022-04-18 DIAGNOSIS — I679 Cerebrovascular disease, unspecified: Secondary | ICD-10-CM | POA: Diagnosis not present

## 2022-04-18 DIAGNOSIS — K3184 Gastroparesis: Secondary | ICD-10-CM | POA: Diagnosis not present

## 2022-04-18 DIAGNOSIS — Z23 Encounter for immunization: Secondary | ICD-10-CM | POA: Diagnosis not present

## 2022-04-18 DIAGNOSIS — E1142 Type 2 diabetes mellitus with diabetic polyneuropathy: Secondary | ICD-10-CM | POA: Diagnosis not present

## 2022-04-18 DIAGNOSIS — E1143 Type 2 diabetes mellitus with diabetic autonomic (poly)neuropathy: Secondary | ICD-10-CM | POA: Diagnosis not present

## 2022-04-18 DIAGNOSIS — I10 Essential (primary) hypertension: Secondary | ICD-10-CM | POA: Diagnosis not present

## 2022-04-24 DIAGNOSIS — I10 Essential (primary) hypertension: Secondary | ICD-10-CM | POA: Diagnosis not present

## 2022-04-24 DIAGNOSIS — K219 Gastro-esophageal reflux disease without esophagitis: Secondary | ICD-10-CM | POA: Diagnosis not present

## 2022-04-24 DIAGNOSIS — E782 Mixed hyperlipidemia: Secondary | ICD-10-CM | POA: Diagnosis not present

## 2022-04-24 DIAGNOSIS — G8929 Other chronic pain: Secondary | ICD-10-CM | POA: Diagnosis not present

## 2022-04-24 DIAGNOSIS — E1142 Type 2 diabetes mellitus with diabetic polyneuropathy: Secondary | ICD-10-CM | POA: Diagnosis not present

## 2022-04-24 DIAGNOSIS — E039 Hypothyroidism, unspecified: Secondary | ICD-10-CM | POA: Diagnosis not present

## 2022-04-24 DIAGNOSIS — F418 Other specified anxiety disorders: Secondary | ICD-10-CM | POA: Diagnosis not present

## 2022-04-28 ENCOUNTER — Ambulatory Visit (INDEPENDENT_AMBULATORY_CARE_PROVIDER_SITE_OTHER): Payer: Medicare HMO

## 2022-04-28 ENCOUNTER — Other Ambulatory Visit: Payer: Self-pay | Admitting: Physician Assistant

## 2022-04-28 DIAGNOSIS — K3184 Gastroparesis: Secondary | ICD-10-CM

## 2022-04-28 DIAGNOSIS — I639 Cerebral infarction, unspecified: Secondary | ICD-10-CM

## 2022-04-29 ENCOUNTER — Other Ambulatory Visit: Payer: Self-pay | Admitting: Cardiovascular Disease

## 2022-04-29 LAB — CUP PACEART REMOTE DEVICE CHECK
Date Time Interrogation Session: 20231001231304
Implantable Pulse Generator Implant Date: 20210831

## 2022-04-30 DIAGNOSIS — C441122 Basal cell carcinoma of skin of right lower eyelid, including canthus: Secondary | ICD-10-CM | POA: Diagnosis not present

## 2022-04-30 DIAGNOSIS — H0279 Other degenerative disorders of eyelid and periocular area: Secondary | ICD-10-CM | POA: Diagnosis not present

## 2022-04-30 DIAGNOSIS — E119 Type 2 diabetes mellitus without complications: Secondary | ICD-10-CM | POA: Diagnosis not present

## 2022-05-01 DIAGNOSIS — B0089 Other herpesviral infection: Secondary | ICD-10-CM | POA: Diagnosis not present

## 2022-05-01 DIAGNOSIS — D485 Neoplasm of uncertain behavior of skin: Secondary | ICD-10-CM | POA: Diagnosis not present

## 2022-05-01 DIAGNOSIS — B353 Tinea pedis: Secondary | ICD-10-CM | POA: Diagnosis not present

## 2022-05-01 DIAGNOSIS — Z85828 Personal history of other malignant neoplasm of skin: Secondary | ICD-10-CM | POA: Diagnosis not present

## 2022-05-01 DIAGNOSIS — D1801 Hemangioma of skin and subcutaneous tissue: Secondary | ICD-10-CM | POA: Diagnosis not present

## 2022-05-01 DIAGNOSIS — L821 Other seborrheic keratosis: Secondary | ICD-10-CM | POA: Diagnosis not present

## 2022-05-01 DIAGNOSIS — D225 Melanocytic nevi of trunk: Secondary | ICD-10-CM | POA: Diagnosis not present

## 2022-05-01 DIAGNOSIS — Z85820 Personal history of malignant melanoma of skin: Secondary | ICD-10-CM | POA: Diagnosis not present

## 2022-05-05 NOTE — Telephone Encounter (Signed)
Spoke with patient & rescheduled time for appointment.

## 2022-05-12 NOTE — Progress Notes (Signed)
Carelink Summary Report / Loop Recorder 

## 2022-05-29 ENCOUNTER — Other Ambulatory Visit: Payer: Self-pay | Admitting: Physician Assistant

## 2022-05-29 DIAGNOSIS — K3184 Gastroparesis: Secondary | ICD-10-CM

## 2022-06-02 ENCOUNTER — Ambulatory Visit (INDEPENDENT_AMBULATORY_CARE_PROVIDER_SITE_OTHER): Payer: Medicare HMO

## 2022-06-02 DIAGNOSIS — I639 Cerebral infarction, unspecified: Secondary | ICD-10-CM

## 2022-06-02 LAB — CUP PACEART REMOTE DEVICE CHECK
Date Time Interrogation Session: 20231105232405
Implantable Pulse Generator Implant Date: 20210831

## 2022-06-03 DIAGNOSIS — J40 Bronchitis, not specified as acute or chronic: Secondary | ICD-10-CM | POA: Diagnosis not present

## 2022-06-17 ENCOUNTER — Telehealth: Payer: Self-pay | Admitting: *Deleted

## 2022-06-17 NOTE — Patient Outreach (Signed)
  Care Coordination   06/17/2022 Name: Cheryl [name] MRN: [id number] DOB: 07-Jun-1966   Care Coordination Outreach Attempts:  An unsuccessful telephone outreach was attempted today to offer the patient information about available care coordination services as a benefit of their health plan.   Follow Up Plan:  Additional outreach attempts will be made to offer the patient care coordination information and services.   Encounter Outcome:  No Answer  Care Coordination Interventions Activated:  No   Care Coordination Interventions:  No, not indicated    Raina Mina, RN Care Management Coordinator [place] [facility] [phone]

## 2022-06-18 ENCOUNTER — Other Ambulatory Visit: Payer: Self-pay | Admitting: Physician Assistant

## 2022-06-18 DIAGNOSIS — K3184 Gastroparesis: Secondary | ICD-10-CM

## 2022-06-23 NOTE — Progress Notes (Unsigned)
06/24/2022 Cheryl [name] [id number] [birth year]  Referring provider: [name], MD Primary GI doctor: [name] Plan  ASSESSMENT AND PLAN:   GERD without esophagitis/gastroparesis GERD 0/2019 endoscopy large amount of retained food GES unremarkable but with history and previous work up likely this is gastroparesis worsened by ozempic.  Lifestyle changes discussed, avoid NSAIDS Continue PPI BID Will get labs to evaluate liver function, CBC, celiac and thyroid.  Diet given for gastroparesis, discuss with PCP about ozempic use, can increase reglan to TID, has only been taking once a day  Screening colonoscopy  12/2017 colonoscopy internal hemorrhoids recall 10 years  Constipation - Increase fiber/ water intake, decrease caffeine, increase activity level. Miralax did not help, will do trial of linzess 290 and will call if this helps for Cheryl [name] to send in Consider talking with PCP about changing or switching dose of Ozempic  Fatty liver disease, nonalcoholic --Continue to work on risk factor modification including diet exercise and control of risk factors including blood sugars. - monitor q 6 months- will check labs  CVA 2021  on Plavix   History of Present Illness:  56 y.o. female  with a past medical history of diabetic, melanoma history, anxiety, degenerative disc disease cervical and lumbar, fatty liver, migraine, ICS, OSA, fibromyalgia, GERD, gastroparesis related to diabetes and chronic narcotic use and others listed below, returns to clinic today for evaluation of GERD, gastroparesis and constipation.  . 04/2018 endoscopy large amount of retained food Subsequent GES unremarkable 10/11/2019 office visit with [name] for chronic nausea. 10/21/2019 CT abdomen pelvis with contrast showed hepatic steatosis with hypertrophy medicate liver disease, splenomegaly, postcholecystectomy and hysterectomy, UPJ type obstruction 02/2020 acute/subacute ganglion stroke,  following [name].  Had modified barium swallow MBS was unremarkable, no signs of aspiration 04/16/2020 abdominal ultrasound with elastography showed median K PA of 2.7 high probability of being normal.  Shows diffuse echotexture compatible with fatty liver 03/06/2022 office visit with myself for GERD, constipation.  Thought likely related to gastroparesis and Ozempic use, given gastroparesis diet and told to discuss with primary care.  Refilled Reglan.  She continues to be on ozempic, has been on the 1 mg for 4 weeks.  She states she continues to have constipation, states miralax did not help, has tried colace without help. Had to take dulcolax last night.  She is having a BM now once a week, twice a week.  She has lower AB pain, better after BM.  Denies fever, chills.  She is having nausea and vomiting, nausea it daily and the vomiting just started again yesterday.   She is on ibuprofen 82m BID for fibromyalgia.  Denies ETOH use.  No drug use.   Current Medications:   Current Outpatient Medications (Endocrine & Metabolic):    HUMULIN R U-500 KWIKPEN 500 UNIT/ML kwikpen, Inject 100 Units into the skin daily.    levothyroxine (SYNTHROID, LEVOTHROID) 75 MCG tablet, Take 75 mcg by mouth daily before breakfast.   liraglutide (VICTOZA) 18 MG/3ML SOPN, Inject 1.2 mg into the skin every evening.  Current Outpatient Medications (Cardiovascular):    diltiazem (CARDIZEM CD) 240 MG 24 hr capsule, TAKE 1 CAPSULE BY MOUTH EVERY MORNING ON AN EMPTY STOMACH - [name] DENIED, FAXED [name] Cancel03/27/17 SS (Patient taking differently: Take 240 mg by mouth daily.)   losartan (COZAAR) 25 MG tablet, Take 25 mg by mouth daily.   nebivolol (BYSTOLIC) 10 MG tablet, Take 1 tablet (10 mg total) by mouth daily. Please call [phone]  to schedule an overdue appointment for future refills. Thank you. 3rd final attempt.  Current Outpatient Medications (Respiratory):    albuterol (VENTOLIN HFA) 108 (90 Base)  MCG/ACT inhaler, INHALE 2 PUFFS EVERY 6 HOURS AS NEEDED FOR SHORTNESS OF BREATH OR WHEEZING   Fluticasone-Umeclidin-Vilant (TRELEGY ELLIPTA) 100-62.5-25 MCG/ACT AEPB, INHALE 1 PUFF INTO THE LUNGS DAILY.   ipratropium-albuterol (DUONEB) 0.5-2.5 (3) MG/3ML SOLN, Take 28m by nebulization every 4-6 hours if needed (Patient taking differently: Inhale 3 mLs into the lungs every 4 (four) hours as needed (wheezing/shortness of breath.).)   promethazine (PHENERGAN) 25 MG tablet, TAKE 1 TABLET TWICE DAILY AS NEEDED FOR NAUSEA  Current Outpatient Medications (Analgesics):    ibuprofen (ADVIL,MOTRIN) 800 MG tablet, Take 800 mg by mouth every 8 (eight) hours as needed (pain).    rizatriptan (MAXALT) 10 MG tablet, Take 10 mg by mouth daily.   SUMAtriptan (IMITREX) 20 MG/ACT nasal spray, Place 20 mg into the nose every 2 (two) hours as needed for migraine or headache. May repeat in 2 hours if headache persists or recurs.  Current Outpatient Medications (Hematological):    clopidogrel (PLAVIX) 75 MG tablet, TAKE 1 TABLET (75 MG TOTAL) BY MOUTH DAILY.  Current Outpatient Medications (Other):    Cholecalciferol (VITAMIN D3) 1000 units CAPS, Take 1,000 Units by mouth daily.   diazepam (VALIUM) 10 MG tablet, Take 10 mg by mouth 2 (two) times daily as needed (panic attacks.).    FLUoxetine (PROZAC) 40 MG capsule, Take 80 mg by mouth at bedtime.    metoCLOPramide (REGLAN) 10 MG tablet, TAKE 1 TABLET EVERY 8 HOURS AS NEEDED FOR NAUSEA   nystatin-triamcinolone (MYCOLOG II) cream, Apply 1 application topically 2 (two) times daily as needed (yeast in skin folds).    pantoprazole (PROTONIX) 40 MG tablet, Take 1 tablet (40 mg total) by mouth 2 (two) times daily.   pentosan polysulfate (ELMIRON) 100 MG capsule, Take 100 mg by mouth 3 (three) times daily.   tiZANidine (ZANAFLEX) 4 MG tablet, Take 4 mg by mouth 3 (three) times daily. May hold one dose if needed to operate motor vehicle.  Surgical History:  She  has a past  surgical history that includes Abdominal hysterectomy (1993); Cholecystectomy (?1990); Nasal sinus surgery (1986; 1990's); Knee arthroscopy (Left, 1990's); Hip surgery (Right, 2002); Shoulder arthroscopy w/ rotator cuff repair (Right); Anterior cervical decomp/discectomy fusion (2004); Carpal tunnel release (Bilateral, ?1980's); RIGHT/LEFT HEART CATH AND CORONARY ANGIOGRAPHY (N/A, 08/26/2017); Colonoscopy; arm surgery; Bronchoscopy (06/28/2019); Video bronchoscopy (Bilateral, 06/28/2019); TEE without cardioversion (N/A, 03/27/2020); Bubble study (03/27/2020); and LOOP RECORDER INSERTION (N/A, 03/27/2020). Family History:  Her family history includes Breast cancer in her cousin, cousin, and maternal aunt; Coronary artery disease in her father; Diabetes in her brother and brother. Social History:   reports that she quit smoking about 13 years ago. Her smoking use included cigarettes. She has a 0.30 pack-year smoking history. She has never used smokeless tobacco. She reports that she does not drink alcohol and does not use drugs.  Current Medications, Allergies, Past Medical History, Past Surgical History, Family History and Social History [facility] [employer]  Physical Exam: BP (!) 158/100   Pulse 82   Ht 5' 2"  (1.575 m)   Wt 186 lb (84.4 kg)   BMI 34.02 kg/m  General:   Pleasant, well developed female in no acute distress Heart : Regular rate and rhythm; no murmurs Pulm: Clear anteriorly; no wheezing Abdomen:  Soft, Obese AB, Active bowel sounds. No tenderness .  Without guarding and Without rebound, No organomegaly appreciated. Rectal: Not evaluated Extremities:  without  edema. Neurologic:  Alert and  oriented x4;  No focal deficits.  Psych:  Cooperative. Normal mood and affect.   Vladimir Crofts, PA-C 06/24/22

## 2022-06-24 ENCOUNTER — Other Ambulatory Visit (INDEPENDENT_AMBULATORY_CARE_PROVIDER_SITE_OTHER): Payer: Medicare HMO

## 2022-06-24 ENCOUNTER — Ambulatory Visit: Payer: Medicare HMO | Admitting: Physician Assistant

## 2022-06-24 ENCOUNTER — Encounter: Payer: Self-pay | Admitting: Physician Assistant

## 2022-06-24 VITALS — BP 158/100 | HR 82 | Ht 62.0 in | Wt 186.0 lb

## 2022-06-24 DIAGNOSIS — K219 Gastro-esophageal reflux disease without esophagitis: Secondary | ICD-10-CM

## 2022-06-24 DIAGNOSIS — K760 Fatty (change of) liver, not elsewhere classified: Secondary | ICD-10-CM

## 2022-06-24 DIAGNOSIS — K5904 Chronic idiopathic constipation: Secondary | ICD-10-CM

## 2022-06-24 DIAGNOSIS — K3184 Gastroparesis: Secondary | ICD-10-CM

## 2022-06-24 LAB — COMPREHENSIVE METABOLIC PANEL
ALT: 38 U/L — ABNORMAL HIGH (ref 0–35)
AST: 23 U/L (ref 0–37)
Albumin: 4.8 g/dL (ref 3.5–5.2)
Alkaline Phosphatase: 82 U/L (ref 39–117)
BUN: 10 mg/dL (ref 6–23)
CO2: 22 mEq/L (ref 19–32)
Calcium: 9.4 mg/dL (ref 8.4–10.5)
Chloride: 105 mEq/L (ref 96–112)
Creatinine, Ser: 0.79 mg/dL (ref 0.40–1.20)
GFR: 84.11 mL/min (ref >60.00)
Glucose, Bld: 129 mg/dL — ABNORMAL HIGH (ref 70–99)
Potassium: 3.6 mEq/L (ref 3.5–5.1)
Sodium: 138 mEq/L (ref 135–145)
Total Bilirubin: 0.7 mg/dL (ref 0.2–1.2)
Total Protein: 7.8 g/dL (ref 6.0–8.3)

## 2022-06-24 LAB — CBC WITH DIFFERENTIAL/PLATELET
Basophils Absolute: 0.2 10*3/uL — ABNORMAL HIGH (ref 0.0–0.1)
Basophils Relative: 1.2 % (ref 0.0–3.0)
Eosinophils Absolute: 0.9 10*3/uL — ABNORMAL HIGH (ref 0.0–0.7)
Eosinophils Relative: 6.9 % — ABNORMAL HIGH (ref 0.0–5.0)
HCT: 42.2 % (ref 36.0–46.0)
Hemoglobin: 14.7 g/dL (ref 12.0–15.0)
Lymphocytes Relative: 28.2 % (ref 12.0–46.0)
Lymphs Abs: 3.9 10*3/uL (ref 0.7–4.0)
MCHC: 34.7 g/dL (ref 30.0–36.0)
MCV: 82.5 fl (ref 78.0–100.0)
Monocytes Absolute: 0.9 10*3/uL (ref 0.1–1.0)
Monocytes Relative: 6.3 % (ref 3.0–12.0)
Neutro Abs: 7.9 10*3/uL — ABNORMAL HIGH (ref 1.4–7.7)
Neutrophils Relative %: 57.4 % (ref 43.0–77.0)
Platelets: 344 10*3/uL (ref 150.0–400.0)
RBC: 5.12 Mil/uL — ABNORMAL HIGH (ref 3.87–5.11)
RDW: 13.5 % (ref 11.5–15.5)
WBC: 13.7 10*3/uL — ABNORMAL HIGH (ref 4.0–10.5)

## 2022-06-24 LAB — LIPASE: Lipase: 18 U/L (ref 11.0–59.0)

## 2022-06-24 LAB — TSH: TSH: 1.16 u[IU]/mL (ref 0.35–5.50)

## 2022-06-24 MED ORDER — LINACLOTIDE 290 MCG PO CAPS: 290.0000 ug | ORAL_CAPSULE | Freq: Every day | ORAL | 0 refills | Status: DC

## 2022-06-24 NOTE — Patient Instructions (Addendum)
Your provider has requested that you go to the basement level for lab work before leaving today. Press "B" on the elevator. The lab is located at the first door on the left as you exit the elevator.  Discuss cutting back on ozempic to 0.5 mg or stopping to see if this is causing the symptoms.   Please take your proton pump inhibitor medication, pantoprazole twice a day  Please take this medication 30 minutes to 1 hour before meals- this makes it more effective.  Avoid spicy and acidic foods Avoid fatty foods Limit your intake of coffee, tea, alcohol, and carbonated drinks Work to maintain a healthy weight Keep the head of the bed elevated at least 3 inches with blocks or a wedge pillow if you are having any nighttime symptoms Stay upright for 2 hours after eating Avoid meals and snacks three to four hours before bedtime  Reglan or metoclopramide  Can be used for gastroparesis or slow stomach, nausea, vomiting, GERD.   Continue the medication as needed up to 3 times a day, on an empty stomach 30 minutes before eating. It may take a few weeks for your stomach condition to start to get better. However, do not take this medicine for longer than 12 weeks.  The longer you take this medicine, and the more you take it, the greater your chances are of developing serious side effects.  Some people may get a severe muscle problem called tardive dyskinesia. This problem may lessen or go away after stopping this drug, but it may not go away. The risk is greater with diabetes and in older adults, especially older females. The risk is greater with longer use or higher doses, but it may also occur after short-term use with low doses. Call your doctor right away if you have trouble controlling body movements or problems with your tongue, face, mouth, or jaw like tongue sticking out, puffing cheeks, mouth puckering, or chewing.   Please monitor for worsening depression or thoughts of suicide, any  aggressiveness or hyperactivity.  If this happen please stop and call your physician right away.   TRY TO GET ON IBUPROFEN, TALK WITH PCP No aleve, ibuprofen, goody powders, as these are antiinflammatories and can cause inflammation in your stomach, increase bleeding risk and cause ulcers.  You can talk with PCP about alternative pain options.  Can do tyelnol max 3000 mg a day, salon pas patches are over the counter and voltern gel is topical antiinflammatory that is safe.    Linzess 290 MCG- IF THIS HELPS CALL us *IBS-C patients may begin to experience relief from belly pain and overall abdominal symptoms (pain, discomfort, and bloating) in about 1 week,  with symptoms typically improving over 12 weeks.  Take at least 30 minutes before the first meal of the day on an empty stomach You can have a loose stool if you eat a high-fat breakfast. Give it at least 7 days, may have more bowel movements during that time.   The diarrhea should go away and you should start having normal, complete, full bowel movements.  It may be helpful to start treatment when you can be near the comfort of your own bathroom, such as a weekend.  After you are out we can send in a prescription if you did well, there is a prescription card  Gastroparesis Please do small frequent meals like 4-6 meals a day.  Eat and drink liquids at separate times.  Avoid high fiber foods, cook your vegetables,  avoid high fat food.  Suggest spreading protein throughout the day (greek yogurt, glucerna, soft meat, milk, eggs) Choose soft foods that you can mash with a fork When you are more symptomatic, change to pureed foods foods and liquids.  Consider reading "Living well with Gastroparesis" by Lambert Keto Gastroparesis is a condition in which food takes longer than normal to empty from the stomach. This condition is also known as delayed gastric emptying. It is usually a long-term (chronic) condition. There is no cure, but  there are treatments and things that you can do at home to help relieve symptoms. Treating the underlying condition that causes gastroparesis can also help relieve symptoms What are the causes? In many cases, the cause of this condition is not known. Possible causes include: A hormone (endocrine) disorder, such as hypothyroidism or diabetes. A nervous system disease, such as Parkinson's disease or multiple sclerosis. Cancer, infection, or surgery that affects the stomach or vagus nerve. The vagus nerve runs from your chest, through your neck, and to the lower part of your brain. A connective tissue disorder, such as scleroderma. Certain medicines. What increases the risk? You are more likely to develop this condition if: You have certain disorders or diseases. These may include: An endocrine disorder. An eating disorder. Amyloidosis. Scleroderma. Parkinson's disease. Multiple sclerosis. Cancer or infection of the stomach or the vagus nerve. You have had surgery on your stomach or vagus nerve. You take certain medicines. You are female. What are the signs or symptoms? Symptoms of this condition include: Feeling full after eating very little or a loss of appetite. Nausea, vomiting, or heartburn. Bloating of your abdomen. Inconsistent blood sugar (glucose) levels on blood tests. Unexplained weight loss. Acid from the stomach coming up into the esophagus (gastroesophageal reflux). Sudden tightening (spasm) of the stomach, which can be painful. Symptoms may come and go. Some people may not notice any symptoms. How is this diagnosed? This condition is diagnosed with tests, such as: Tests that check how long it takes food to move through the stomach and intestines. These tests include: Upper gastrointestinal (GI) series. For this test, you drink a liquid that shows up well on X-rays, and then X-rays are taken of your intestines. Gastric emptying scintigraphy. For this test, you eat food  that contains a small amount of radioactive material, and then scans are taken. Wireless capsule GI monitoring system. For this test, you swallow a pill (capsule) that records information about how foods and fluid move through your stomach. Gastric manometry. For this test, a tube is passed down your throat and into your stomach to measure electrical and muscular activity. Endoscopy. For this test, a long, thin tube with a camera and light on the end is passed down your throat and into your stomach to check for problems in your stomach lining. Ultrasound. This test uses sound waves to create images of the inside of your body. This can help rule out gallbladder disease or pancreatitis as a cause of your symptoms. How is this treated? There is no cure for this condition, but treatment and home care may relieve symptoms. Treatment may include: Treating the underlying cause. Managing your symptoms by making changes to your diet and exercise habits. Taking medicines to control nausea and vomiting and to stimulate stomach muscles. Getting food through a feeding tube in the hospital. This may be done in severe cases. Having surgery to insert a device called a gastric electrical stimulator into your body. This device helps improve stomach emptying  and control nausea and vomiting. Follow these instructions at home: Take over-the-counter and prescription medicines only as told by your health care provider. Follow instructions from your health care provider about eating or drinking restrictions. Your health care provider may recommend that you: Eat smaller meals more often. Eat low-fat foods. Eat low-fiber forms of high-fiber foods. For example, eat cooked vegetables instead of raw vegetables. Have only liquid foods instead of solid foods. Liquid foods are easier to digest. Drink enough fluid to keep your urine pale yellow. Exercise as often as told by your health care provider. Keep all follow-up visits.  This is important. Contact a health care provider if you: Notice that your symptoms do not improve with treatment. Have new symptoms. Get help right away if you: Have severe pain in your abdomen that does not improve with treatment. Have nausea that is severe or does not go away. Vomit every time you drink fluids. Summary Gastroparesis is a long-term (chronic) condition in which food takes longer than normal to empty from the stomach. Symptoms include nausea, vomiting, heartburn, bloating of your abdomen, and loss of appetite. Eating smaller portions, low-fat foods, and low-fiber forms of high-fiber foods may help you manage your symptoms. Get help right away if you have severe pain in your abdomen. This information is not intended to replace advice given to you by your health care provider. Make sure you discuss any questions you have with your health care provider. Document Revised: 11/21/2019 Document Reviewed: 11/21/2019 Elsevier Patient Education  2021 [employer].

## 2022-06-26 ENCOUNTER — Other Ambulatory Visit: Payer: Self-pay

## 2022-06-26 DIAGNOSIS — D72829 Elevated white blood cell count, unspecified: Secondary | ICD-10-CM

## 2022-06-26 DIAGNOSIS — K760 Fatty (change of) liver, not elsewhere classified: Secondary | ICD-10-CM

## 2022-06-26 DIAGNOSIS — R7989 Other specified abnormal findings of blood chemistry: Secondary | ICD-10-CM

## 2022-06-26 LAB — TISSUE TRANSGLUTAMINASE, IGA: (tTG) Ab, IgA: <1 U/mL

## 2022-06-26 LAB — IGA: Immunoglobulin A: 247 mg/dL (ref 47–310)

## 2022-07-01 NOTE — Progress Notes (Signed)
Carelink Summary Report / Loop Recorder 

## 2022-07-02 ENCOUNTER — Other Ambulatory Visit: Payer: Self-pay

## 2022-07-02 ENCOUNTER — Telehealth: Payer: Self-pay | Admitting: Physician Assistant

## 2022-07-02 MED ORDER — LINACLOTIDE 290 MCG PO CAPS: 290.0000 ug | ORAL_CAPSULE | Freq: Every day | ORAL | 3 refills | Status: DC

## 2022-07-02 NOTE — Telephone Encounter (Signed)
Prescription sent to pharmacy. Patient is aware

## 2022-07-02 NOTE — Telephone Encounter (Signed)
Inbound call from patient stating she would like a prescription for linzess sent into the pharmacy. Patient states she was recently given samples and  has been doing very well with the medication has worked really well. Please advise.  Thank you

## 2022-07-07 ENCOUNTER — Ambulatory Visit (INDEPENDENT_AMBULATORY_CARE_PROVIDER_SITE_OTHER): Payer: Medicare HMO

## 2022-07-07 DIAGNOSIS — I639 Cerebral infarction, unspecified: Secondary | ICD-10-CM | POA: Diagnosis not present

## 2022-07-07 LAB — CUP PACEART REMOTE DEVICE CHECK
Date Time Interrogation Session: 20231210231324
Implantable Pulse Generator Implant Date: 20210831

## 2022-07-23 DIAGNOSIS — E1165 Type 2 diabetes mellitus with hyperglycemia: Secondary | ICD-10-CM | POA: Diagnosis not present

## 2022-07-29 DIAGNOSIS — E1142 Type 2 diabetes mellitus with diabetic polyneuropathy: Secondary | ICD-10-CM | POA: Diagnosis not present

## 2022-07-29 DIAGNOSIS — G894 Chronic pain syndrome: Secondary | ICD-10-CM | POA: Diagnosis not present

## 2022-07-29 DIAGNOSIS — Z7185 Encounter for immunization safety counseling: Secondary | ICD-10-CM | POA: Diagnosis not present

## 2022-07-29 DIAGNOSIS — I10 Essential (primary) hypertension: Secondary | ICD-10-CM | POA: Diagnosis not present

## 2022-07-29 DIAGNOSIS — G43009 Migraine without aura, not intractable, without status migrainosus: Secondary | ICD-10-CM | POA: Diagnosis not present

## 2022-07-29 DIAGNOSIS — E669 Obesity, unspecified: Secondary | ICD-10-CM | POA: Diagnosis not present

## 2022-07-29 DIAGNOSIS — K769 Liver disease, unspecified: Secondary | ICD-10-CM | POA: Diagnosis not present

## 2022-07-30 ENCOUNTER — Other Ambulatory Visit: Payer: Self-pay

## 2022-07-30 MED ORDER — LINACLOTIDE 290 MCG PO CAPS: 290.0000 ug | ORAL_CAPSULE | Freq: Every day | ORAL | 3 refills | Status: DC

## 2022-07-30 NOTE — Telephone Encounter (Signed)
Spoke with patient & confirmed that she would like her refills sent to mail order [facility]. Prescription updated.

## 2022-07-30 NOTE — Telephone Encounter (Signed)
Inbound cal from patient needing refill on linzess medication sent into mail order pharmacy [facility]? Please advise.

## 2022-08-11 ENCOUNTER — Ambulatory Visit (INDEPENDENT_AMBULATORY_CARE_PROVIDER_SITE_OTHER): Payer: Medicare HMO

## 2022-08-11 DIAGNOSIS — I639 Cerebral infarction, unspecified: Secondary | ICD-10-CM | POA: Diagnosis not present

## 2022-08-11 NOTE — Progress Notes (Signed)
[facility] Summary Report / Loop Recorder

## 2022-08-12 LAB — CUP PACEART REMOTE DEVICE CHECK
Date Time Interrogation Session: 20240112231231
Implantable Pulse Generator Implant Date: 20210831

## 2022-08-25 DIAGNOSIS — Z794 Long term (current) use of insulin: Secondary | ICD-10-CM | POA: Diagnosis not present

## 2022-08-25 DIAGNOSIS — E1165 Type 2 diabetes mellitus with hyperglycemia: Secondary | ICD-10-CM | POA: Diagnosis not present

## 2022-08-25 DIAGNOSIS — E039 Hypothyroidism, unspecified: Secondary | ICD-10-CM | POA: Diagnosis not present

## 2022-08-25 DIAGNOSIS — I10 Essential (primary) hypertension: Secondary | ICD-10-CM | POA: Diagnosis not present

## 2022-08-25 DIAGNOSIS — E1142 Type 2 diabetes mellitus with diabetic polyneuropathy: Secondary | ICD-10-CM | POA: Diagnosis not present

## 2022-09-05 ENCOUNTER — Ambulatory Visit: Payer: Medicare HMO | Admitting: Internal Medicine

## 2022-09-11 DIAGNOSIS — Z1272 Encounter for screening for malignant neoplasm of vagina: Secondary | ICD-10-CM | POA: Diagnosis not present

## 2022-09-11 DIAGNOSIS — Z6833 Body mass index (BMI) 33.0-33.9, adult: Secondary | ICD-10-CM | POA: Diagnosis not present

## 2022-09-11 DIAGNOSIS — M8588 Other specified disorders of bone density and structure, other site: Secondary | ICD-10-CM | POA: Diagnosis not present

## 2022-09-11 DIAGNOSIS — Z124 Encounter for screening for malignant neoplasm of cervix: Secondary | ICD-10-CM | POA: Diagnosis not present

## 2022-09-11 DIAGNOSIS — N959 Unspecified menopausal and perimenopausal disorder: Secondary | ICD-10-CM | POA: Diagnosis not present

## 2022-09-11 DIAGNOSIS — Z1231 Encounter for screening mammogram for malignant neoplasm of breast: Secondary | ICD-10-CM | POA: Diagnosis not present

## 2022-09-11 DIAGNOSIS — Z8262 Family history of osteoporosis: Secondary | ICD-10-CM | POA: Diagnosis not present

## 2022-09-12 LAB — CUP PACEART REMOTE DEVICE CHECK
Date Time Interrogation Session: 20240214231851
Implantable Pulse Generator Implant Date: 20210831

## 2022-09-15 ENCOUNTER — Ambulatory Visit: Payer: Medicare HMO

## 2022-09-15 ENCOUNTER — Ambulatory Visit: Payer: Medicare HMO | Admitting: Gastroenterology

## 2022-09-15 ENCOUNTER — Encounter: Payer: Self-pay | Admitting: Gastroenterology

## 2022-09-15 VITALS — BP 132/82 | HR 76 | Ht 62.0 in | Wt 185.0 lb

## 2022-09-15 DIAGNOSIS — K5904 Chronic idiopathic constipation: Secondary | ICD-10-CM

## 2022-09-15 DIAGNOSIS — K219 Gastro-esophageal reflux disease without esophagitis: Secondary | ICD-10-CM | POA: Diagnosis not present

## 2022-09-15 DIAGNOSIS — I639 Cerebral infarction, unspecified: Secondary | ICD-10-CM | POA: Diagnosis not present

## 2022-09-15 MED ORDER — PANTOPRAZOLE SODIUM 40 MG PO TBEC: 40.0000 mg | DELAYED_RELEASE_TABLET | Freq: Two times a day (BID) | ORAL | 3 refills | Status: AC

## 2022-09-15 MED ORDER — LINACLOTIDE 290 MCG PO CAPS: 290.0000 ug | ORAL_CAPSULE | Freq: Every day | ORAL | 3 refills | Status: DC

## 2022-09-15 NOTE — Progress Notes (Signed)
    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  All her symptoms are under good control on her current regimen.  She states she has a bowel movement every other day Linzess and she feels that to complete bowel movement.  She has infrequent episodes of breakthrough reflux.  Overall she is doing well from a GI standpoint.   Labs / Imaging       Latest Ref Rng & Units 06/24/2022   12:04 PM 03/24/2020    4:28 AM 03/23/2020    8:33 PM  Hepatic Function  Total Protein 6.0 - 8.3 g/dL 7.8  6.3  7.1   Albumin 3.5 - 5.2 g/dL 4.8  3.6  3.9   AST 0 - 37 U/L 23  33  37   ALT 0 - 35 U/L 38  52  55   Alk Phosphatase 39 - 117 U/L 82  64  65   Total Bilirubin 0.2 - 1.2 mg/dL 0.7  0.5  0.5        Latest Ref Rng & Units 06/24/2022   12:04 PM 03/24/2020    4:28 AM 03/23/2020    5:46 PM  CBC  WBC 4.0 - 10.5 K/uL 13.7  10.1    Hemoglobin 12.0 - 15.0 g/dL 14.7  12.0  13.6   Hematocrit 36.0 - 46.0 % 42.2  35.6  40.0   Platelets 150.0 - 400.0 K/uL 344.0  255     Current Medications, Allergies, Past Medical History, Past Surgical History, Family History and Social History were reviewed in [employer] record.   Physical Exam: General: Well developed, well nourished, no acute distress Head: Normocephalic and atraumatic Eyes: Sclerae anicteric, EOMI Ears: Normal auditory acuity Mouth: No deformities or lesions noted Lungs: Clear throughout to auscultation Heart: Regular rate and rhythm; No murmurs, rubs or bruits Abdomen:  Soft, non tender and non distended. No masses, hepatosplenomegaly or hernias noted. Normal Bowel sounds Rectal: Not done Musculoskeletal: Symmetrical with no gross deformities  Pulses:  Normal pulses noted Extremities: No edema or deformities noted Neurological: Alert oriented x 4, grossly nonfocal Psychological:  Alert and cooperative. Normal mood and affect   Eastyn Dattilo T. Fuller Plan, MD 09/15/2022, 11:08 AM

## 2022-09-15 NOTE — Patient Instructions (Signed)
We have sent the following medications to your pharmacy for you to pick up at your convenience: pantoprazole and Linzess.   The [facility] providers would like to encourage you to use [facility] to communicate with providers for non-urgent requests or questions.  Due to long hold times on the telephone, sending your provider a message by [facility] may be a faster and more efficient way to get a response.  Please allow 48 business hours for a response.  Please remember that this is for non-urgent requests.   Thank you for choosing me and [facility].  Pricilla Riffle. Dagoberto Ligas., MD., Marval Regal

## 2022-09-16 DIAGNOSIS — G894 Chronic pain syndrome: Secondary | ICD-10-CM | POA: Diagnosis not present

## 2022-09-18 NOTE — Progress Notes (Signed)
Carelink Summary Report / Loop Recorder 

## 2022-09-29 ENCOUNTER — Other Ambulatory Visit: Payer: Self-pay | Admitting: Internal Medicine

## 2022-10-01 NOTE — Progress Notes (Deleted)
Subjective:    Patient ID: Cheryl [name], female    DOB: Jan 09, 1966, 57 y.o.   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 HST 12/16/16- AHI 6.2/ hr, desaturation to 84%, body weight 213 lbs. PFT 07/18/15-WNL-FVC 3.03/88%, FEV1 2.63/95%, ratio 0.87, TLC 105%, DLCO 113% Echocardiogram 02/12/15-EF 60-65 percent, grade 1 diastolic dysfunction Video bronchoscopy 06/28/2019- for evaluation of ground glass and tiny nodules, with concern of possible eosinophilic pneumonia, nonspecific with histiocytes noted- no malignancy or eosinophils. 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  She is comfortable without using O2 or CPAP with sleep.  Denies cough or wheeze.  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  Review of Systems- see HPI   + = positive Constitutional:   No-   weight loss, night sweats, fevers, chills,+irregular sleep schedule, +fatigue, lassitude. HEENT:   No-headaches, difficulty swallowing, tooth/dental problems, +sore throat,       No-  sneezing, itching, +ear ache, nasal congestion, post nasal drip,  CV:  chest pain, orthopnea, PND, swelling in lower extremities, anasarca,  dizziness, +palpitations Resp: + shortness of breath with exertion or at rest.              No- productive cough,  + non-productive cough,  No- coughing up of blood.              No- change in color of mucus.   Skin: No-   rash or lesions. GI:  No-   heartburn, indigestion, abdominal pain, nausea, vomiting,  GU:  MS:  No-   joint pain or swelling.   Neuro-    +tremor Psych:  No- change in mood or affect. + depression or anxiety.  No memory loss.  Objective:   Physical Exam      OBJ- Physical Exam General- Alert, Oriented,  Distress- none acute. + overweight Skin- rash-none, lesions- none, excoriation- none Lymphadenopathy- none Head- atraumatic            Eyes- Gross vision intact, PERRLA, conjunctivae and secretions clear            Ears- Hearing, canals-normal            Nose-  turbinate edema-none, no-Septal dev, mucus, polyps, erosion, perforation, + sniffing  Throat- Mallampati II , mucosa clear , drainage- none, tonsils- atrophic,  Neck- flexible , trachea midline, no stridor , thyroid nl, carotid no bruit Chest - symmetrical excursion , unlabored           Heart/CV- RRR , no murmur , no gallop  , no rub, nl s1 s2                           - JVD- none , edema- none, stasis changes- none, varices- none           Lung-  Clear, Wheeze-none, cough-none , dullness-none, rub- none           Chest wall-  Abd-   Br/ Gen/ Rectal- Not done, not indicated Extrem- cyanosis- none, clubbing, none, atrophy- none, strength- nl Neuro- +tremor

## 2022-10-03 ENCOUNTER — Ambulatory Visit: Payer: Medicare HMO | Admitting: Internal Medicine

## 2022-10-20 ENCOUNTER — Ambulatory Visit (INDEPENDENT_AMBULATORY_CARE_PROVIDER_SITE_OTHER): Payer: Medicare HMO

## 2022-10-20 DIAGNOSIS — I639 Cerebral infarction, unspecified: Secondary | ICD-10-CM

## 2022-10-20 LAB — CUP PACEART REMOTE DEVICE CHECK
Date Time Interrogation Session: 20240324231810
Implantable Pulse Generator Implant Date: 20210831

## 2022-10-21 DIAGNOSIS — E1165 Type 2 diabetes mellitus with hyperglycemia: Secondary | ICD-10-CM | POA: Diagnosis not present

## 2022-10-27 NOTE — Progress Notes (Signed)
Carelink Summary Report / Loop Recorder 

## 2022-11-02 NOTE — Progress Notes (Addendum)
Subjective:    Patient ID: Cheryl [name], female    DOB: 18-Nov-1965, 57 y.o.   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 HST 12/16/16- AHI 6.2/ hr, desaturation to 84%, body weight 213 lbs. PFT 07/18/15-WNL-FVC 3.03/88%, FEV1 2.63/95%, ratio 0.87, TLC 105%, DLCO 113% Echocardiogram 02/12/15-EF 60-65 percent, grade 1 diastolic dysfunction Video bronchoscopy 06/28/2019- for evaluation of ground glass and tiny nodules, with concern of possible eosinophilic pneumonia, nonspecific with histiocytes noted- no malignancy or eosinophils. 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  She is comfortable without using O2 or CPAP with sleep.  Denies cough or wheeze.  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  Breathing is comfortable with no recent exacerbation.  Using rescue inhaler about once daily. She was intolerant of CPAP and stopped using.  Daughter tells her she snores.  Sleeps alone.  Does agree to update home sleep test. CXR 09/08/21 The heart size and mediastinal contours are within normal limits. Both lungs are clear. The visualized skeletal structures are unremarkable. IMPRESSION: No active cardiopulmonary disease.                                         //Cousin Ricka Burdock died COPD Pos a1AT def--consider assay//   Review of Systems- see HPI   + = positive Constitutional:   No-   weight loss, night sweats, fevers, chills,+irregular sleep schedule, +fatigue, lassitude. HEENT:   No-headaches, difficulty swallowing, tooth/dental problems, +sore throat,       No-  sneezing, itching, +ear ache, nasal congestion, post nasal drip,  CV:  chest pain, orthopnea, PND, swelling in lower extremities, anasarca,  dizziness, +palpitations Resp: + shortness of breath with exertion or at rest.              No- productive cough,  + non-productive cough,  No- coughing up of blood.              No- change in color of mucus.   Skin: No-   rash or lesions. GI:  No-   heartburn, indigestion, abdominal pain, nausea, vomiting,  GU:  MS:  No-   joint pain or swelling.   Neuro-    +tremor Psych:  No- change  in mood or affect. + depression or anxiety.  No memory loss.  Objective:   Physical Exam      OBJ- Physical Exam General- Alert, Oriented,  Distress- none acute. + overweight Skin- rash-none, lesions- none, excoriation- none Lymphadenopathy- none Head- atraumatic            Eyes- Gross vision intact, PERRLA, conjunctivae and secretions clear            Ears- Hearing, canals-normal            Nose-  turbinate edema-none, no-Septal dev, mucus, polyps, erosion,  perforation, + sniffing            Throat- Mallampati II , mucosa clear , drainage- none, tonsils- atrophic,  Neck- flexible , trachea midline, no stridor , thyroid nl, carotid no bruit Chest - symmetrical excursion , unlabored           Heart/CV- RRR , no murmur , no gallop  , no rub, nl s1 s2                           - JVD- none , edema- none, stasis changes- none, varices- none           Lung-  Clear, Wheeze-none, cough-none , dullness-none, rub- none           Chest wall-  Abd-  Br/ Gen/ Rectal- Not done, not indicated Extrem- cyanosis- none, clubbing, none, atrophy- none, strength- nl Neuro- +tremor

## 2022-11-04 ENCOUNTER — Ambulatory Visit (INDEPENDENT_AMBULATORY_CARE_PROVIDER_SITE_OTHER): Payer: Medicare HMO

## 2022-11-04 ENCOUNTER — Ambulatory Visit: Payer: Medicare HMO | Admitting: Internal Medicine

## 2022-11-04 ENCOUNTER — Encounter: Payer: Self-pay | Admitting: Internal Medicine

## 2022-11-04 VITALS — BP 142/88 | HR 74 | Ht 62.0 in | Wt 188.0 lb

## 2022-11-04 DIAGNOSIS — J849 Interstitial pulmonary disease, unspecified: Secondary | ICD-10-CM | POA: Diagnosis not present

## 2022-11-04 DIAGNOSIS — G4733 Obstructive sleep apnea (adult) (pediatric): Secondary | ICD-10-CM

## 2022-11-04 DIAGNOSIS — J45909 Unspecified asthma, uncomplicated: Secondary | ICD-10-CM

## 2022-11-04 NOTE — Patient Instructions (Signed)
Order- CXR   dx Asthma mild intermittent uncomplicated  Order- schedule HST   dx OSA  You can call us for results and recommendations about 2 weeks after the sleep tet.

## 2022-11-10 DIAGNOSIS — I959 Hypotension, unspecified: Secondary | ICD-10-CM | POA: Diagnosis not present

## 2022-11-12 ENCOUNTER — Other Ambulatory Visit: Payer: Self-pay | Admitting: Internal Medicine

## 2022-11-14 DIAGNOSIS — G4730 Sleep apnea, unspecified: Secondary | ICD-10-CM | POA: Diagnosis not present

## 2022-11-24 ENCOUNTER — Ambulatory Visit (INDEPENDENT_AMBULATORY_CARE_PROVIDER_SITE_OTHER): Payer: Medicare HMO

## 2022-11-24 DIAGNOSIS — E1165 Type 2 diabetes mellitus with hyperglycemia: Secondary | ICD-10-CM | POA: Diagnosis not present

## 2022-11-24 DIAGNOSIS — Z794 Long term (current) use of insulin: Secondary | ICD-10-CM | POA: Diagnosis not present

## 2022-11-24 DIAGNOSIS — E1142 Type 2 diabetes mellitus with diabetic polyneuropathy: Secondary | ICD-10-CM | POA: Diagnosis not present

## 2022-11-24 DIAGNOSIS — I10 Essential (primary) hypertension: Secondary | ICD-10-CM | POA: Diagnosis not present

## 2022-11-24 DIAGNOSIS — I639 Cerebral infarction, unspecified: Secondary | ICD-10-CM | POA: Diagnosis not present

## 2022-11-24 DIAGNOSIS — E1143 Type 2 diabetes mellitus with diabetic autonomic (poly)neuropathy: Secondary | ICD-10-CM | POA: Diagnosis not present

## 2022-11-24 DIAGNOSIS — E039 Hypothyroidism, unspecified: Secondary | ICD-10-CM | POA: Diagnosis not present

## 2022-11-24 DIAGNOSIS — K3184 Gastroparesis: Secondary | ICD-10-CM | POA: Diagnosis not present

## 2022-11-24 LAB — CUP PACEART REMOTE DEVICE CHECK
Date Time Interrogation Session: 20240426230424
Implantable Pulse Generator Implant Date: 20210831

## 2022-11-27 NOTE — Progress Notes (Signed)
Carelink Summary Report / Loop Recorder 

## 2022-12-04 ENCOUNTER — Telehealth: Payer: Self-pay | Admitting: Internal Medicine

## 2022-12-04 NOTE — Telephone Encounter (Signed)
Pt. Calling back to get HST results but looked I think we have not received them yet told pt. We will call her back when it gets released

## 2022-12-05 ENCOUNTER — Encounter: Payer: Self-pay | Admitting: Internal Medicine

## 2022-12-05 NOTE — Assessment & Plan Note (Signed)
Mild intermittent uncomplicated.  Rescue inhaler is sufficient for now Plan-CXR

## 2022-12-05 NOTE — Assessment & Plan Note (Signed)
Last CXR was clear Plan-update CXR

## 2022-12-05 NOTE — Assessment & Plan Note (Signed)
Noncompliant with CPAP but does now admit reported snoring Plan-schedule home sleep test

## 2022-12-08 NOTE — Telephone Encounter (Signed)
Dr. Maple Hudson please advise if you have received patients HST results?

## 2022-12-09 DIAGNOSIS — B3731 Acute candidiasis of vulva and vagina: Secondary | ICD-10-CM | POA: Diagnosis not present

## 2022-12-09 DIAGNOSIS — I679 Cerebrovascular disease, unspecified: Secondary | ICD-10-CM | POA: Diagnosis not present

## 2022-12-09 DIAGNOSIS — I10 Essential (primary) hypertension: Secondary | ICD-10-CM | POA: Diagnosis not present

## 2022-12-10 ENCOUNTER — Encounter (INDEPENDENT_AMBULATORY_CARE_PROVIDER_SITE_OTHER): Payer: Medicare HMO

## 2022-12-10 DIAGNOSIS — G4733 Obstructive sleep apnea (adult) (pediatric): Secondary | ICD-10-CM | POA: Diagnosis not present

## 2022-12-10 DIAGNOSIS — R0683 Snoring: Secondary | ICD-10-CM | POA: Diagnosis not present

## 2022-12-10 NOTE — Telephone Encounter (Signed)
I would like to wait to discuss sleep study when she comes back, since it can be interpreted in different ways. If she wants to get back on treatment sooner please let me know.

## 2022-12-10 NOTE — Telephone Encounter (Signed)
Okay to use a held spot to try and see her sooner? Patient is scheduled to come back 7/11

## 2022-12-11 DIAGNOSIS — K219 Gastro-esophageal reflux disease without esophagitis: Secondary | ICD-10-CM | POA: Diagnosis not present

## 2022-12-11 DIAGNOSIS — I10 Essential (primary) hypertension: Secondary | ICD-10-CM | POA: Diagnosis not present

## 2022-12-11 DIAGNOSIS — E1142 Type 2 diabetes mellitus with diabetic polyneuropathy: Secondary | ICD-10-CM | POA: Diagnosis not present

## 2022-12-11 DIAGNOSIS — E785 Hyperlipidemia, unspecified: Secondary | ICD-10-CM | POA: Diagnosis not present

## 2022-12-11 DIAGNOSIS — E1165 Type 2 diabetes mellitus with hyperglycemia: Secondary | ICD-10-CM | POA: Diagnosis not present

## 2022-12-11 NOTE — Telephone Encounter (Signed)
It would be ok to use a held spot for earlier return

## 2022-12-12 NOTE — Telephone Encounter (Signed)
Pt has been scheduled for sooner f/u. NFN

## 2022-12-18 NOTE — Progress Notes (Signed)
Subjective:    Patient ID: Cheryl [name], female    DOB: 1965-10-01, 57 y.o.   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 HST 12/16/16- AHI 6.2/ hr, desaturation to 84%, body weight 213 lbs. PFT 07/18/15-WNL-FVC 3.03/88%, FEV1 2.63/95%, ratio 0.87, TLC 105%, DLCO 113% Echocardiogram 02/12/15-EF 60-65 percent, grade 1 diastolic dysfunction Video bronchoscopy 06/28/2019- for evaluation of ground glass and tiny nodules, with concern of possible eosinophilic pneumonia, nonspecific with histiocytes noted- no malignancy or eosinophils. 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  Breathing is comfortable with no recent exacerbation.  Using rescue inhaler about once daily. She was intolerant of CPAP and stopped using.  Daughter tells her she snores.  Sleeps alone.  Does agree to update home sleep test. CXR 09/08/21 The heart size and mediastinal contours are within normal limits. Both lungs are clear. The visualized skeletal structures are unremarkable. 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 AHI (3%) 20.6/ hr. Snoring, desaturation to 87%, body weight 188 lbs Body weight today- We discussed the distinction between AHI 4% and AHI 3%.  Previously she had tried CPAP but kept pulling the mask off.  Aware that she snores.  Only occasionally notices daytime tiredness.  Generally feels well. CXR 11/05/22- FINDINGS: The heart size and mediastinal contours are within normal limits. Both lungs are clear. The visualized skeletal structures are unremarkable. IMPRESSION: No active cardiopulmonary disease.     Review of Systems- see HPI   + = positive Constitutional:   No-   weight loss, night sweats, fevers, chills,+irregular sleep schedule, +fatigue, lassitude. HEENT:   No-headaches, difficulty swallowing, tooth/dental problems, +sore throat,       No-  sneezing, itching, +ear ache, nasal congestion, post nasal drip,  CV:  chest pain, orthopnea, PND, swelling in lower extremities, anasarca,  dizziness, +palpitations Resp: + shortness of breath with exertion or at rest.              No- productive cough,  + non-productive cough,  No- coughing up of blood.  No- change in color of mucus.   Skin: No-   rash or lesions. GI:  No-   heartburn, indigestion, abdominal pain, nausea, vomiting,  GU:  MS:  No-   joint pain or swelling.   Neuro-    +tremor Psych:  No- change in mood or affect. + depression or anxiety.  No memory loss.  Objective:   Physical Exam      OBJ- Physical Exam General- Alert,  Oriented,  Distress- none acute. + obese Skin- rash-none, lesions- none, excoriation- none Lymphadenopathy- none Head- atraumatic            Eyes- Gross vision intact, PERRLA, conjunctivae and secretions clear            Ears- Hearing, canals-normal            Nose-  turbinate edema-none, no-Septal dev, mucus, polyps, erosion, perforation,             Throat- Mallampati II , mucosa clear , drainage- none, tonsils- atrophic, +bad teeth Neck- flexible , trachea midline, no stridor , thyroid nl, carotid no bruit Chest - symmetrical excursion , unlabored           Heart/CV- RRR , no murmur , no gallop  , no rub, nl s1 s2                           - JVD- none , edema- none, stasis changes- none, varices- none           Lung-  Clear, Wheeze-none, cough-none , dullness-none, rub- none           Chest wall-  Abd-  Br/ Gen/ Rectal- Not done, not indicated Extrem- cyanosis- none, clubbing, none, atrophy- none, strength- nl Neuro- +tremor

## 2022-12-19 ENCOUNTER — Ambulatory Visit: Payer: Medicare HMO | Admitting: Internal Medicine

## 2022-12-19 ENCOUNTER — Encounter: Payer: Self-pay | Admitting: Internal Medicine

## 2022-12-19 VITALS — BP 126/78 | HR 82 | Ht 62.0 in | Wt 188.6 lb

## 2022-12-19 DIAGNOSIS — G4733 Obstructive sleep apnea (adult) (pediatric): Secondary | ICD-10-CM | POA: Diagnosis not present

## 2022-12-19 DIAGNOSIS — J45909 Unspecified asthma, uncomplicated: Secondary | ICD-10-CM

## 2022-12-19 DIAGNOSIS — J849 Interstitial pulmonary disease, unspecified: Secondary | ICD-10-CM

## 2022-12-19 NOTE — Patient Instructions (Addendum)
For your snoring- try to keep your weight down and sleep off the flat of your back.  We can consider repeating a sleep study, maybe in a year, to see where you are with the sleep apnea question.   Ok to use otc flonase nasal spray and otc antihistamines like claritin or zyrtec as needed for runny nose andd allergies.  We are here to help if you need [name].

## 2022-12-24 ENCOUNTER — Ambulatory Visit (INDEPENDENT_AMBULATORY_CARE_PROVIDER_SITE_OTHER): Payer: Medicare HMO

## 2022-12-24 DIAGNOSIS — I639 Cerebral infarction, unspecified: Secondary | ICD-10-CM

## 2022-12-25 LAB — CUP PACEART REMOTE DEVICE CHECK
Date Time Interrogation Session: 20240529231130
Implantable Pulse Generator Implant Date: 20210831

## 2022-12-25 NOTE — Progress Notes (Signed)
Carelink Summary Report / Loop Recorder 

## 2023-01-02 DIAGNOSIS — H524 Presbyopia: Secondary | ICD-10-CM | POA: Diagnosis not present

## 2023-01-02 DIAGNOSIS — E119 Type 2 diabetes mellitus without complications: Secondary | ICD-10-CM | POA: Diagnosis not present

## 2023-01-05 DIAGNOSIS — K3184 Gastroparesis: Secondary | ICD-10-CM | POA: Diagnosis not present

## 2023-01-05 DIAGNOSIS — Z794 Long term (current) use of insulin: Secondary | ICD-10-CM | POA: Diagnosis not present

## 2023-01-05 DIAGNOSIS — E1142 Type 2 diabetes mellitus with diabetic polyneuropathy: Secondary | ICD-10-CM | POA: Diagnosis not present

## 2023-01-05 DIAGNOSIS — E039 Hypothyroidism, unspecified: Secondary | ICD-10-CM | POA: Diagnosis not present

## 2023-01-05 DIAGNOSIS — I10 Essential (primary) hypertension: Secondary | ICD-10-CM | POA: Diagnosis not present

## 2023-01-05 DIAGNOSIS — E1165 Type 2 diabetes mellitus with hyperglycemia: Secondary | ICD-10-CM | POA: Diagnosis not present

## 2023-01-08 DIAGNOSIS — J441 Chronic obstructive pulmonary disease with (acute) exacerbation: Secondary | ICD-10-CM | POA: Diagnosis not present

## 2023-01-08 DIAGNOSIS — F3342 Major depressive disorder, recurrent, in full remission: Secondary | ICD-10-CM | POA: Diagnosis not present

## 2023-01-15 NOTE — Progress Notes (Signed)
Carelink Summary Report / Loop Recorder 

## 2023-01-19 DIAGNOSIS — E1165 Type 2 diabetes mellitus with hyperglycemia: Secondary | ICD-10-CM | POA: Diagnosis not present

## 2023-01-26 ENCOUNTER — Ambulatory Visit (INDEPENDENT_AMBULATORY_CARE_PROVIDER_SITE_OTHER): Payer: Medicare HMO

## 2023-01-26 DIAGNOSIS — I639 Cerebral infarction, unspecified: Secondary | ICD-10-CM

## 2023-01-27 LAB — CUP PACEART REMOTE DEVICE CHECK
Date Time Interrogation Session: 20240701230744
Implantable Pulse Generator Implant Date: 20210831

## 2023-02-05 ENCOUNTER — Ambulatory Visit: Payer: Medicare HMO | Admitting: Internal Medicine

## 2023-02-05 DIAGNOSIS — R69 Illness, unspecified: Secondary | ICD-10-CM | POA: Diagnosis not present

## 2023-02-09 ENCOUNTER — Encounter: Payer: Self-pay | Admitting: Internal Medicine

## 2023-02-09 NOTE — Assessment & Plan Note (Signed)
I have encouraged weight loss and sleep position off flat of back.  We can consider updating sleep study in a year if appropriate.

## 2023-02-09 NOTE — Assessment & Plan Note (Signed)
Continue present meds, reminding her to use her nebulizer machine if needed

## 2023-02-09 NOTE — Assessment & Plan Note (Signed)
CXR 11/05/2022 is clear without evident interstitial disease and especially nothing progressive. Plan-watch over time.

## 2023-02-11 NOTE — Progress Notes (Signed)
 Carelink Summary Report / Loop Recorder 

## 2023-02-12 DIAGNOSIS — Z794 Long term (current) use of insulin: Secondary | ICD-10-CM | POA: Diagnosis not present

## 2023-02-12 DIAGNOSIS — L299 Pruritus, unspecified: Secondary | ICD-10-CM | POA: Diagnosis not present

## 2023-02-12 DIAGNOSIS — E1142 Type 2 diabetes mellitus with diabetic polyneuropathy: Secondary | ICD-10-CM | POA: Diagnosis not present

## 2023-02-12 DIAGNOSIS — I10 Essential (primary) hypertension: Secondary | ICD-10-CM | POA: Diagnosis not present

## 2023-02-12 DIAGNOSIS — K3184 Gastroparesis: Secondary | ICD-10-CM | POA: Diagnosis not present

## 2023-02-12 DIAGNOSIS — E039 Hypothyroidism, unspecified: Secondary | ICD-10-CM | POA: Diagnosis not present

## 2023-02-12 DIAGNOSIS — E1165 Type 2 diabetes mellitus with hyperglycemia: Secondary | ICD-10-CM | POA: Diagnosis not present

## 2023-03-01 LAB — CUP PACEART REMOTE DEVICE CHECK
Date Time Interrogation Session: 20240803230246
Implantable Pulse Generator Implant Date: 20210831

## 2023-03-02 ENCOUNTER — Ambulatory Visit (INDEPENDENT_AMBULATORY_CARE_PROVIDER_SITE_OTHER): Payer: Medicare HMO

## 2023-03-02 DIAGNOSIS — I639 Cerebral infarction, unspecified: Secondary | ICD-10-CM

## 2023-03-05 DIAGNOSIS — R69 Illness, unspecified: Secondary | ICD-10-CM | POA: Diagnosis not present

## 2023-03-12 NOTE — Progress Notes (Signed)
Carelink Summary Report / Loop Recorder 

## 2023-04-03 LAB — CUP PACEART REMOTE DEVICE CHECK
Date Time Interrogation Session: 20240905230737
Implantable Pulse Generator Implant Date: 20210831

## 2023-04-06 ENCOUNTER — Ambulatory Visit (INDEPENDENT_AMBULATORY_CARE_PROVIDER_SITE_OTHER): Payer: Medicare HMO

## 2023-04-06 DIAGNOSIS — I639 Cerebral infarction, unspecified: Secondary | ICD-10-CM

## 2023-04-16 NOTE — Progress Notes (Signed)
Carelink Summary Report / Loop Recorder 

## 2023-04-19 DIAGNOSIS — E1165 Type 2 diabetes mellitus with hyperglycemia: Secondary | ICD-10-CM | POA: Diagnosis not present

## 2023-04-24 DIAGNOSIS — M797 Fibromyalgia: Secondary | ICD-10-CM | POA: Diagnosis not present

## 2023-04-24 DIAGNOSIS — J4530 Mild persistent asthma, uncomplicated: Secondary | ICD-10-CM | POA: Diagnosis not present

## 2023-04-24 DIAGNOSIS — D72829 Elevated white blood cell count, unspecified: Secondary | ICD-10-CM | POA: Diagnosis not present

## 2023-04-24 DIAGNOSIS — G894 Chronic pain syndrome: Secondary | ICD-10-CM | POA: Diagnosis not present

## 2023-04-24 DIAGNOSIS — R252 Cramp and spasm: Secondary | ICD-10-CM | POA: Diagnosis not present

## 2023-04-24 DIAGNOSIS — E782 Mixed hyperlipidemia: Secondary | ICD-10-CM | POA: Diagnosis not present

## 2023-04-24 DIAGNOSIS — I10 Essential (primary) hypertension: Secondary | ICD-10-CM | POA: Diagnosis not present

## 2023-04-24 DIAGNOSIS — E1143 Type 2 diabetes mellitus with diabetic autonomic (poly)neuropathy: Secondary | ICD-10-CM | POA: Diagnosis not present

## 2023-04-24 DIAGNOSIS — E039 Hypothyroidism, unspecified: Secondary | ICD-10-CM | POA: Diagnosis not present

## 2023-04-30 DIAGNOSIS — J069 Acute upper respiratory infection, unspecified: Secondary | ICD-10-CM | POA: Diagnosis not present

## 2023-05-01 DIAGNOSIS — R051 Acute cough: Secondary | ICD-10-CM | POA: Diagnosis not present

## 2023-05-01 DIAGNOSIS — Z03818 Encounter for observation for suspected exposure to other biological agents ruled out: Secondary | ICD-10-CM | POA: Diagnosis not present

## 2023-05-11 ENCOUNTER — Ambulatory Visit (INDEPENDENT_AMBULATORY_CARE_PROVIDER_SITE_OTHER): Payer: Medicare HMO

## 2023-05-11 DIAGNOSIS — I639 Cerebral infarction, unspecified: Secondary | ICD-10-CM | POA: Diagnosis not present

## 2023-05-11 LAB — CUP PACEART REMOTE DEVICE CHECK
Date Time Interrogation Session: 20241013231025
Implantable Pulse Generator Implant Date: 20210831

## 2023-05-15 DIAGNOSIS — E039 Hypothyroidism, unspecified: Secondary | ICD-10-CM | POA: Diagnosis not present

## 2023-05-15 DIAGNOSIS — E1142 Type 2 diabetes mellitus with diabetic polyneuropathy: Secondary | ICD-10-CM | POA: Diagnosis not present

## 2023-05-15 DIAGNOSIS — I10 Essential (primary) hypertension: Secondary | ICD-10-CM | POA: Diagnosis not present

## 2023-05-15 DIAGNOSIS — Z794 Long term (current) use of insulin: Secondary | ICD-10-CM | POA: Diagnosis not present

## 2023-05-15 DIAGNOSIS — E1165 Type 2 diabetes mellitus with hyperglycemia: Secondary | ICD-10-CM | POA: Diagnosis not present

## 2023-05-25 NOTE — Progress Notes (Signed)
Carelink Summary Report / Loop Recorder 

## 2023-06-15 ENCOUNTER — Ambulatory Visit (INDEPENDENT_AMBULATORY_CARE_PROVIDER_SITE_OTHER): Payer: Medicare HMO

## 2023-06-15 DIAGNOSIS — I639 Cerebral infarction, unspecified: Secondary | ICD-10-CM | POA: Diagnosis not present

## 2023-06-15 LAB — CUP PACEART REMOTE DEVICE CHECK
Date Time Interrogation Session: 20241117230627
Implantable Pulse Generator Implant Date: 20210831

## 2023-07-08 NOTE — Progress Notes (Signed)
Carelink Summary Report / Loop Recorder 

## 2023-07-18 DIAGNOSIS — E1165 Type 2 diabetes mellitus with hyperglycemia: Secondary | ICD-10-CM | POA: Diagnosis not present

## 2023-07-20 ENCOUNTER — Ambulatory Visit (INDEPENDENT_AMBULATORY_CARE_PROVIDER_SITE_OTHER): Payer: Medicare HMO

## 2023-07-20 DIAGNOSIS — I639 Cerebral infarction, unspecified: Secondary | ICD-10-CM

## 2023-07-20 LAB — CUP PACEART REMOTE DEVICE CHECK
Date Time Interrogation Session: 20241222230323
Implantable Pulse Generator Implant Date: 20210831

## 2023-07-23 ENCOUNTER — Other Ambulatory Visit: Payer: Self-pay | Admitting: Internal Medicine

## 2023-07-31 DIAGNOSIS — G894 Chronic pain syndrome: Secondary | ICD-10-CM | POA: Diagnosis not present

## 2023-07-31 DIAGNOSIS — J069 Acute upper respiratory infection, unspecified: Secondary | ICD-10-CM | POA: Diagnosis not present

## 2023-07-31 DIAGNOSIS — E1142 Type 2 diabetes mellitus with diabetic polyneuropathy: Secondary | ICD-10-CM | POA: Diagnosis not present

## 2023-07-31 DIAGNOSIS — M797 Fibromyalgia: Secondary | ICD-10-CM | POA: Diagnosis not present

## 2023-07-31 DIAGNOSIS — E782 Mixed hyperlipidemia: Secondary | ICD-10-CM | POA: Diagnosis not present

## 2023-07-31 DIAGNOSIS — I10 Essential (primary) hypertension: Secondary | ICD-10-CM | POA: Diagnosis not present

## 2023-08-14 DIAGNOSIS — K3184 Gastroparesis: Secondary | ICD-10-CM | POA: Diagnosis not present

## 2023-08-14 DIAGNOSIS — E039 Hypothyroidism, unspecified: Secondary | ICD-10-CM | POA: Diagnosis not present

## 2023-08-14 DIAGNOSIS — Z794 Long term (current) use of insulin: Secondary | ICD-10-CM | POA: Diagnosis not present

## 2023-08-14 DIAGNOSIS — E1142 Type 2 diabetes mellitus with diabetic polyneuropathy: Secondary | ICD-10-CM | POA: Diagnosis not present

## 2023-08-14 DIAGNOSIS — I10 Essential (primary) hypertension: Secondary | ICD-10-CM | POA: Diagnosis not present

## 2023-08-14 DIAGNOSIS — E1165 Type 2 diabetes mellitus with hyperglycemia: Secondary | ICD-10-CM | POA: Diagnosis not present

## 2023-08-25 DIAGNOSIS — R1031 Right lower quadrant pain: Secondary | ICD-10-CM | POA: Diagnosis not present

## 2023-08-25 DIAGNOSIS — N760 Acute vaginitis: Secondary | ICD-10-CM | POA: Diagnosis not present

## 2023-08-25 DIAGNOSIS — R300 Dysuria: Secondary | ICD-10-CM | POA: Diagnosis not present

## 2023-08-25 NOTE — Progress Notes (Signed)
[facility] Summary Report / Loop Recorder

## 2023-09-14 DIAGNOSIS — R509 Fever, unspecified: Secondary | ICD-10-CM | POA: Diagnosis not present

## 2023-09-14 DIAGNOSIS — B001 Herpesviral vesicular dermatitis: Secondary | ICD-10-CM | POA: Diagnosis not present

## 2023-09-28 DIAGNOSIS — R509 Fever, unspecified: Secondary | ICD-10-CM | POA: Diagnosis not present

## 2023-09-29 ENCOUNTER — Telehealth: Payer: Self-pay | Admitting: Internal Medicine

## 2023-09-29 DIAGNOSIS — J45909 Unspecified asthma, uncomplicated: Secondary | ICD-10-CM

## 2023-09-29 DIAGNOSIS — J849 Interstitial pulmonary disease, unspecified: Secondary | ICD-10-CM

## 2023-09-29 MED ORDER — IPRATROPIUM-ALBUTEROL 0.5-2.5 (3) MG/3ML IN SOLN: 3.0000 mL | Freq: Four times a day (QID) | RESPIRATORY_TRACT | 3 refills | Status: AC | PRN

## 2023-09-29 MED ORDER — NEBULIZER MASK ADULT/TUBING MISC: 1.0000 | 1 refills | Status: AC

## 2023-09-29 NOTE — Telephone Encounter (Signed)
 PT needs Duoneb Neb solution called in to [facility]. Suffering now with the Flu.  Fever Cough Hoarse Aches Ribs Hurt Phlegm /Green, small amounts.  Can we cal in anything else for these symptoms? Her # [phone]

## 2023-09-29 NOTE — Telephone Encounter (Signed)
 Spoke with patient, she was seen yesterday by Deboraha Sprang and tested pos for Flu. They rx'd Tamiflu, she is starting that today. She is out of DuoNeb and would like refill, also needs neb supplies. Rxs sent to [facility]. Advised patient if [facility] does not have neb supplies these will have to be sent to DME company. Reviewed OTC meds such as Delsym, Tylenol/Advil, Mucinex for symptoms.

## 2023-10-16 DIAGNOSIS — E1165 Type 2 diabetes mellitus with hyperglycemia: Secondary | ICD-10-CM | POA: Diagnosis not present

## 2023-10-28 ENCOUNTER — Other Ambulatory Visit: Payer: Self-pay | Admitting: Physician Assistant

## 2023-10-28 DIAGNOSIS — K3184 Gastroparesis: Secondary | ICD-10-CM

## 2023-11-02 ENCOUNTER — Ambulatory Visit: Payer: Medicare HMO

## 2023-11-02 DIAGNOSIS — I639 Cerebral infarction, unspecified: Secondary | ICD-10-CM

## 2023-11-03 LAB — CUP PACEART REMOTE DEVICE CHECK
Date Time Interrogation Session: 20250406230302
Implantable Pulse Generator Implant Date: 20210831

## 2023-11-12 DIAGNOSIS — K3184 Gastroparesis: Secondary | ICD-10-CM | POA: Diagnosis not present

## 2023-11-12 DIAGNOSIS — E1165 Type 2 diabetes mellitus with hyperglycemia: Secondary | ICD-10-CM | POA: Diagnosis not present

## 2023-11-12 DIAGNOSIS — Z794 Long term (current) use of insulin: Secondary | ICD-10-CM | POA: Diagnosis not present

## 2023-11-12 DIAGNOSIS — E039 Hypothyroidism, unspecified: Secondary | ICD-10-CM | POA: Diagnosis not present

## 2023-11-12 DIAGNOSIS — I10 Essential (primary) hypertension: Secondary | ICD-10-CM | POA: Diagnosis not present

## 2023-11-12 DIAGNOSIS — E1142 Type 2 diabetes mellitus with diabetic polyneuropathy: Secondary | ICD-10-CM | POA: Diagnosis not present

## 2023-11-23 ENCOUNTER — Other Ambulatory Visit: Payer: Self-pay | Admitting: Internal Medicine

## 2023-11-23 NOTE — Telephone Encounter (Signed)
 Will okay one refill on albuterol  MDI.  Patient must keep OV scheduled for 12/24/2023 with Dr. Linder Revere.  Last OV with Dr. Linder Revere was 12/19/2022.  Per chart note:  Return in about 1 year (around 12/19/2023).  And refilled albuterol  on 11/12/2022 in refill encounter.

## 2023-12-07 ENCOUNTER — Ambulatory Visit (INDEPENDENT_AMBULATORY_CARE_PROVIDER_SITE_OTHER): Payer: Medicare HMO

## 2023-12-07 DIAGNOSIS — I639 Cerebral infarction, unspecified: Secondary | ICD-10-CM

## 2023-12-07 LAB — CUP PACEART REMOTE DEVICE CHECK
Date Time Interrogation Session: 20250511230848
Implantable Pulse Generator Implant Date: 20210831

## 2023-12-14 ENCOUNTER — Ambulatory Visit: Payer: Self-pay | Admitting: Cardiology

## 2023-12-14 NOTE — Progress Notes (Signed)
 Carelink Summary Report / Loop Recorder

## 2023-12-14 NOTE — Addendum Note (Signed)
 Addended by: Edra Govern D on: 12/14/2023 11:40 AM   Modules accepted: Orders

## 2023-12-24 ENCOUNTER — Ambulatory Visit: Admitting: Internal Medicine

## 2023-12-31 ENCOUNTER — Other Ambulatory Visit: Payer: Self-pay | Admitting: Internal Medicine

## 2023-12-31 DIAGNOSIS — E1165 Type 2 diabetes mellitus with hyperglycemia: Secondary | ICD-10-CM | POA: Diagnosis not present

## 2023-12-31 DIAGNOSIS — J441 Chronic obstructive pulmonary disease with (acute) exacerbation: Secondary | ICD-10-CM | POA: Diagnosis not present

## 2023-12-31 DIAGNOSIS — I10 Essential (primary) hypertension: Secondary | ICD-10-CM | POA: Diagnosis not present

## 2023-12-31 DIAGNOSIS — E1142 Type 2 diabetes mellitus with diabetic polyneuropathy: Secondary | ICD-10-CM | POA: Diagnosis not present

## 2024-01-01 DIAGNOSIS — J441 Chronic obstructive pulmonary disease with (acute) exacerbation: Secondary | ICD-10-CM | POA: Diagnosis not present

## 2024-01-01 DIAGNOSIS — I10 Essential (primary) hypertension: Secondary | ICD-10-CM | POA: Diagnosis not present

## 2024-01-01 DIAGNOSIS — E1142 Type 2 diabetes mellitus with diabetic polyneuropathy: Secondary | ICD-10-CM | POA: Diagnosis not present

## 2024-01-01 DIAGNOSIS — E1165 Type 2 diabetes mellitus with hyperglycemia: Secondary | ICD-10-CM | POA: Diagnosis not present

## 2024-01-07 ENCOUNTER — Encounter

## 2024-01-07 LAB — CUP PACEART REMOTE DEVICE CHECK
Date Time Interrogation Session: 20250611230615
Implantable Pulse Generator Implant Date: 20210831

## 2024-01-12 ENCOUNTER — Ambulatory Visit: Payer: Self-pay | Admitting: Cardiology

## 2024-01-15 DIAGNOSIS — E1142 Type 2 diabetes mellitus with diabetic polyneuropathy: Secondary | ICD-10-CM | POA: Diagnosis not present

## 2024-01-15 DIAGNOSIS — Z0000 Encounter for general adult medical examination without abnormal findings: Secondary | ICD-10-CM | POA: Diagnosis not present

## 2024-01-15 DIAGNOSIS — I679 Cerebrovascular disease, unspecified: Secondary | ICD-10-CM | POA: Diagnosis not present

## 2024-01-15 DIAGNOSIS — Z6833 Body mass index (BMI) 33.0-33.9, adult: Secondary | ICD-10-CM | POA: Diagnosis not present

## 2024-01-15 DIAGNOSIS — I10 Essential (primary) hypertension: Secondary | ICD-10-CM | POA: Diagnosis not present

## 2024-01-15 DIAGNOSIS — K3184 Gastroparesis: Secondary | ICD-10-CM | POA: Diagnosis not present

## 2024-01-15 DIAGNOSIS — E039 Hypothyroidism, unspecified: Secondary | ICD-10-CM | POA: Diagnosis not present

## 2024-01-15 DIAGNOSIS — J849 Interstitial pulmonary disease, unspecified: Secondary | ICD-10-CM | POA: Diagnosis not present

## 2024-01-15 DIAGNOSIS — E782 Mixed hyperlipidemia: Secondary | ICD-10-CM | POA: Diagnosis not present

## 2024-01-19 ENCOUNTER — Other Ambulatory Visit: Payer: Self-pay | Admitting: Internal Medicine

## 2024-01-20 ENCOUNTER — Encounter: Payer: Self-pay | Admitting: *Deleted

## 2024-01-20 NOTE — Progress Notes (Signed)
 Carelink Summary Report / Loop Recorder

## 2024-01-25 DIAGNOSIS — E1165 Type 2 diabetes mellitus with hyperglycemia: Secondary | ICD-10-CM | POA: Diagnosis not present

## 2024-01-25 DIAGNOSIS — E1142 Type 2 diabetes mellitus with diabetic polyneuropathy: Secondary | ICD-10-CM | POA: Diagnosis not present

## 2024-01-25 DIAGNOSIS — J4530 Mild persistent asthma, uncomplicated: Secondary | ICD-10-CM | POA: Diagnosis not present

## 2024-01-25 DIAGNOSIS — J441 Chronic obstructive pulmonary disease with (acute) exacerbation: Secondary | ICD-10-CM | POA: Diagnosis not present

## 2024-01-25 DIAGNOSIS — E039 Hypothyroidism, unspecified: Secondary | ICD-10-CM | POA: Diagnosis not present

## 2024-01-25 DIAGNOSIS — I10 Essential (primary) hypertension: Secondary | ICD-10-CM | POA: Diagnosis not present

## 2024-01-29 DIAGNOSIS — I10 Essential (primary) hypertension: Secondary | ICD-10-CM | POA: Diagnosis not present

## 2024-01-29 DIAGNOSIS — J441 Chronic obstructive pulmonary disease with (acute) exacerbation: Secondary | ICD-10-CM | POA: Diagnosis not present

## 2024-01-29 DIAGNOSIS — E1165 Type 2 diabetes mellitus with hyperglycemia: Secondary | ICD-10-CM | POA: Diagnosis not present

## 2024-01-29 DIAGNOSIS — E1142 Type 2 diabetes mellitus with diabetic polyneuropathy: Secondary | ICD-10-CM | POA: Diagnosis not present

## 2024-02-08 ENCOUNTER — Ambulatory Visit

## 2024-02-08 ENCOUNTER — Ambulatory Visit: Payer: Self-pay | Admitting: Cardiology

## 2024-02-08 DIAGNOSIS — I639 Cerebral infarction, unspecified: Secondary | ICD-10-CM

## 2024-02-08 LAB — CUP PACEART REMOTE DEVICE CHECK
Date Time Interrogation Session: 20250713231324
Implantable Pulse Generator Implant Date: 20210831

## 2024-02-12 ENCOUNTER — Ambulatory Visit: Admitting: Physician Assistant

## 2024-02-12 ENCOUNTER — Encounter: Payer: Self-pay | Admitting: Physician Assistant

## 2024-02-12 ENCOUNTER — Other Ambulatory Visit

## 2024-02-12 VITALS — BP 130/78 | HR 62 | Ht 62.0 in | Wt 183.0 lb

## 2024-02-12 DIAGNOSIS — R300 Dysuria: Secondary | ICD-10-CM | POA: Diagnosis not present

## 2024-02-12 DIAGNOSIS — M25551 Pain in right hip: Secondary | ICD-10-CM | POA: Diagnosis not present

## 2024-02-12 DIAGNOSIS — K219 Gastro-esophageal reflux disease without esophagitis: Secondary | ICD-10-CM | POA: Diagnosis not present

## 2024-02-12 DIAGNOSIS — K5904 Chronic idiopathic constipation: Secondary | ICD-10-CM | POA: Diagnosis not present

## 2024-02-12 DIAGNOSIS — R1031 Right lower quadrant pain: Secondary | ICD-10-CM | POA: Diagnosis not present

## 2024-02-12 DIAGNOSIS — G4733 Obstructive sleep apnea (adult) (pediatric): Secondary | ICD-10-CM | POA: Diagnosis not present

## 2024-02-12 DIAGNOSIS — I63512 Cerebral infarction due to unspecified occlusion or stenosis of left middle cerebral artery: Secondary | ICD-10-CM | POA: Diagnosis not present

## 2024-02-12 DIAGNOSIS — K3184 Gastroparesis: Secondary | ICD-10-CM | POA: Diagnosis not present

## 2024-02-12 DIAGNOSIS — K760 Fatty (change of) liver, not elsewhere classified: Secondary | ICD-10-CM | POA: Diagnosis not present

## 2024-02-12 LAB — COMPREHENSIVE METABOLIC PANEL WITH GFR
ALT: 33 U/L (ref 0–35)
AST: 21 U/L (ref 0–37)
Albumin: 4.9 g/dL (ref 3.5–5.2)
Alkaline Phosphatase: 94 U/L (ref 39–117)
BUN: 14 mg/dL (ref 6–23)
CO2: 27 meq/L (ref 19–32)
Calcium: 10 mg/dL (ref 8.4–10.5)
Chloride: 97 meq/L (ref 96–112)
Creatinine, Ser: 0.71 mg/dL (ref 0.40–1.20)
GFR: 94.52 mL/min (ref >60.00)
Glucose, Bld: 362 mg/dL — ABNORMAL HIGH (ref 70–99)
Potassium: 4.3 meq/L (ref 3.5–5.1)
Sodium: 133 meq/L — ABNORMAL LOW (ref 135–145)
Total Bilirubin: 0.7 mg/dL (ref 0.2–1.2)
Total Protein: 7.8 g/dL (ref 6.0–8.3)

## 2024-02-12 LAB — URINALYSIS, ROUTINE W REFLEX MICROSCOPIC
Bilirubin Urine: NEGATIVE
Hgb urine dipstick: NEGATIVE
Ketones, ur: NEGATIVE
Leukocytes,Ua: NEGATIVE
Nitrite: NEGATIVE
Specific Gravity, Urine: 1.01 (ref 1.000–1.030)
Total Protein, Urine: NEGATIVE
Urine Glucose: >=1000 — AB
Urobilinogen, UA: 0.2 (ref 0.0–1.0)
WBC, UA: NONE SEEN (ref 0–?)
pH: 6.5 (ref 5.0–8.0)

## 2024-02-12 LAB — CBC
HCT: 39.5 % (ref 36.0–46.0)
Hemoglobin: 13.5 g/dL (ref 12.0–15.0)
MCHC: 34.1 g/dL (ref 30.0–36.0)
MCV: 87 fl (ref 78.0–100.0)
Platelets: 329 K/uL (ref 150.0–400.0)
RBC: 4.54 Mil/uL (ref 3.87–5.11)
RDW: 13.4 % (ref 11.5–15.5)
WBC: 12.7 K/uL — ABNORMAL HIGH (ref 4.0–10.5)

## 2024-02-12 LAB — LIPASE: Lipase: 29 U/L (ref 11.0–59.0)

## 2024-02-12 LAB — C-REACTIVE PROTEIN: CRP: <1 mg/dL (ref 0.5–20.0)

## 2024-02-12 LAB — SEDIMENTATION RATE: Sed Rate: 5 mm/h (ref 0–30)

## 2024-02-12 MED ORDER — HYOSCYAMINE SULFATE 0.125 MG PO TABS: 0.1250 mg | ORAL_TABLET | Freq: Four times a day (QID) | ORAL | 0 refills | Status: DC | PRN

## 2024-02-12 MED ORDER — LINACLOTIDE 290 MCG PO CAPS: 290.0000 ug | ORAL_CAPSULE | Freq: Every day | ORAL | 3 refills | Status: AC

## 2024-02-12 NOTE — Progress Notes (Signed)
 02/12/2024 Cheryl [name] [id number] January 07, 1966  Referring provider: Frederik Charleston, MD Primary GI doctor: Dr. Federico  ASSESSMENT AND PLAN:  RLQ squeezing AB x 1 year 12/2017 colonoscopy internal hemorrhoids recall 10 years 2021 Chronic right lower abdominal pain with recent exacerbation, associated with nausea and vomiting. Positive Carnett's sign suggests abdominal wall pain. Differential includes referred pain from the right hip. - Order abdominal x-ray and right hip x-ray. - Prescribe hyoscyamine for quick relief. - Check labs before CT scan. - Consider CT scan of abdomen and pelvis with contrast if labs indicate or symptoms persist. - Administer allergy prophylaxis if CT with contrast is performed. - restart linzess  - try salon pas patches - check  urine with culture, had previous renal infection in 2021 and states it feels similar  GERD with suspected gastroparesis   2019 endoscopy large amount of retained food 2019 GES unremarkable but with history and previous work up likely this is gastroparesis worsened by ozempic.  2021 barium swallow unremarkable Celiac negative Has been off ozempic/mounjaro for unknown timeframe but reflux has improved off the medication improved  Constipation Previously worsened by GLP On Linzess  290 mcg daily but has been out for several weeks Restart linzess  290 mcg, samples given, refilled, may be contributing to AB pain  Hepatic steatosis 04/16/2020 abdominal ultrasound with elastography showed median K PA of 2.7 high probability of being normal.  Shows diffuse echotexture compatible with fatty liver  2009 hepatitis C, B and a negative, negative ANA, negative celiac    Latest Ref Rng & Units 06/24/2022   12:04 PM 03/24/2020    4:28 AM 03/23/2020    8:33 PM  Hepatic Function  Total Protein 6.0 - 8.3 g/dL 7.8  6.3  7.1   Albumin  3.5 - 5.2 g/dL 4.8  3.6  3.9   AST 0 - 37 U/L 23  33  37   ALT 0 - 35 U/L 38  52  55   Alk  Phosphatase 39 - 117 U/L 82  64  65   Total Bilirubin 0.2 - 1.2 mg/dL 0.7  0.5  0.5    Platelets 344.0   - need LFTs and CBC monitored every 6 months, - evaluation with imaging every 2-3 years.  -Continue to work on risk factor modification including diet exercise and control of risk factors including blood sugars. - cessation of alcohol  History of CVA 2021  on Plavix   DM  Screening colonoscopy  12/2017 colonoscopy internal hemorrhoids recall 10 years    Patient Care Team: Frederik Charleston, MD as PCP - General (Family Medicine) Delford Maude BROCKS, MD as PCP - Cardiology (Cardiology)  HISTORY OF PRESENT ILLNESS: 58 y.o. female with a past medical history listed below presents for evaluation of AB pain.   Last seen by Dr. Aneita 08/2022, I previously saw the patient 06/24/2022  Discussed the use of AI scribe software for clinical note transcription with the patient, who gave verbal consent to proceed.  History of Present Illness   Cheryl [name] is a 58 year old female with reflux and probable gastroparesis who presents with worsening abdominal pain.  She has been experiencing right lower abdominal pain for over a year, which has worsened significantly over the past several weeks. The pain is described as a 'squeezing' sensation that is present daily and consistent. It occasionally radiates to her back but does not extend to her buttock or upper body. The pain does not improve with bowel movements, eating,  or changes in position, although using a heating pad provides some relief.  She experiences nausea and vomiting associated with the pain. No fevers, chills, or changes in bowel habits. She has a history of constipation and has been out of Linzess  for a couple of weeks, which she previously used to manage her symptoms. Despite this, she has daily bowel movements, though they are of small volume and formed.  She has a history of gastroparesis and reflux, which have  improved since discontinuing Mounjaro over a month ago. She previously switched from Ozempic to Mounjaro but stopped due to worsening symptoms. No urinary issues, shortness of breath, chest pain, or blood in her stool. She has a history of a right-sided obstruction in 2021 and has seen multiple specialists for her symptoms.  Her current medications include Plavix  and she has recently been prescribed Zetia for high cholesterol. She was previously on Reglan  but has stopped it due to lack of efficacy and potential side effects. She has not used antispasmodics like dicyclomine or Leveson before.      She  reports that she quit smoking about 15 years ago. Her smoking use included cigarettes. She started smoking about 18 years ago. She has a 0.3 pack-year smoking history. She has never used smokeless tobacco. She reports that she does not drink alcohol and does not use drugs.  RELEVANT GI HISTORY, IMAGING AND LABS: Results         CT AP W 2021 1. Hepatic steatosis with lobular hepatic contours and caudate hypertrophy. This may indicate liver disease. Correlate with any clinical or laboratory evidence of liver disease. 2.  splenomegaly. 3. Mild right UPJ type obstruction, not present previously. No visible obstructing lesion or calculus. Very subtle slight delay in the enhancement of the kidneys may be present best seen on coronal images. Correlate with any history of renal colic. Follow-up renal sonogram may be helpful to assess for any change over time with urologic consultation if there is worsening or if symptoms are strongly suggestive of renal colic. Changes are mild and could also be related to differential hydration, unmasking previously occult mild UPJ configuration. 4. Post cholecystectomy and hysterectomy. 5. Mild stranding over the anterior abdomen may represent sites of medication injection similar to appearance in 2017. CBC    Component Value Date/Time   WBC 13.7 (H) 06/24/2022  1204   RBC 5.12 (H) 06/24/2022 1204   HGB 14.7 06/24/2022 1204   HGB 11.6 08/21/2017 1117   HGB 13.4 02/11/2016 0817   HCT 42.2 06/24/2022 1204   HCT 32.5 (L) 08/21/2017 1117   HCT 38.4 02/11/2016 0817   PLT 344.0 06/24/2022 1204   PLT 342 08/21/2017 1117   MCV 82.5 06/24/2022 1204   MCV 84 08/21/2017 1117   MCV 84.0 02/11/2016 0817   MCH 28.4 03/24/2020 0428   MCHC 34.7 06/24/2022 1204   RDW 13.5 06/24/2022 1204   RDW 15.4 08/21/2017 1117   RDW 14.0 02/11/2016 0817   LYMPHSABS 3.9 06/24/2022 1204   LYMPHSABS 3.8 (H) 08/21/2017 1117   LYMPHSABS 4.1 (H) 02/11/2016 0817   MONOABS 0.9 06/24/2022 1204   MONOABS 0.7 02/11/2016 0817   EOSABS 0.9 (H) 06/24/2022 1204   EOSABS 0.5 (H) 08/21/2017 1117   BASOSABS 0.2 (H) 06/24/2022 1204   BASOSABS 0.1 08/21/2017 1117   BASOSABS 0.0 02/11/2016 0817   No results for input(s): HGB in the last 8760 hours.  CMP     Component Value Date/Time   NA 138 06/24/2022 1204  NA 142 08/21/2017 1117   NA 138 02/11/2016 0817   K 3.6 06/24/2022 1204   K 3.7 02/11/2016 0817   CL 105 06/24/2022 1204   CO2 22 06/24/2022 1204   CO2 23 02/11/2016 0817   GLUCOSE 129 (H) 06/24/2022 1204   GLUCOSE 280 (H) 02/11/2016 0817   BUN 10 06/24/2022 1204   BUN 13 08/21/2017 1117   BUN 8.2 02/11/2016 0817   CREATININE 0.79 06/24/2022 1204   CREATININE 0.8 02/11/2016 0817   CALCIUM  9.4 06/24/2022 1204   CALCIUM  9.7 02/11/2016 0817   PROT 7.8 06/24/2022 1204   PROT 8.0 02/11/2016 0817   ALBUMIN  4.8 06/24/2022 1204   ALBUMIN  4.1 02/11/2016 0817   AST 23 06/24/2022 1204   AST 20 02/11/2016 0817   ALT 38 (H) 06/24/2022 1204   ALT 32 02/11/2016 0817   ALKPHOS 82 06/24/2022 1204   ALKPHOS 116 02/11/2016 0817   BILITOT 0.7 06/24/2022 1204   BILITOT 0.59 02/11/2016 0817   GFRNONAA >60 03/26/2020 0142   GFRAA >60 03/26/2020 0142      Latest Ref Rng & Units 06/24/2022   12:04 PM 03/24/2020    4:28 AM 03/23/2020    8:33 PM  Hepatic Function  Total  Protein 6.0 - 8.3 g/dL 7.8  6.3  7.1   Albumin  3.5 - 5.2 g/dL 4.8  3.6  3.9   AST 0 - 37 U/L 23  33  37   ALT 0 - 35 U/L 38  52  55   Alk Phosphatase 39 - 117 U/L 82  64  65   Total Bilirubin 0.2 - 1.2 mg/dL 0.7  0.5  0.5       Current Medications:   Current Outpatient Medications (Endocrine & Metabolic):    HUMULIN  R U-500 KWIKPEN 500 UNIT/ML kwikpen, Inject 100 Units into the skin daily.    levothyroxine  (SYNTHROID , LEVOTHROID) 75 MCG tablet, Take 75 mcg by mouth daily before breakfast.  Current Outpatient Medications (Cardiovascular):    amLODipine (NORVASC) 2.5 MG tablet, Take 2.5 mg by mouth daily.   atorvastatin  (LIPITOR ) 80 MG tablet, Take 80 mg by mouth daily.   diltiazem  (CARDIZEM  CD) 240 MG 24 hr capsule, TAKE 1 CAPSULE BY MOUTH EVERY MORNING ON AN EMPTY STOMACH - [name] DENIED, FAXED DR DELFORD 10/22/15 SS   ezetimibe (ZETIA) 10 MG tablet, Take 10 mg by mouth daily.   losartan  (COZAAR ) 25 MG tablet, Take 25 mg by mouth daily.   nebivolol  (BYSTOLIC ) 10 MG tablet, Take 1 tablet (10 mg total) by mouth daily. Please call [phone] to schedule an overdue appointment for future refills. Thank you. 3rd final attempt.  Current Outpatient Medications (Respiratory):    albuterol  (VENTOLIN  HFA) 108 (90 Base) MCG/ACT inhaler, INHALE 2 PUFFS EVERY 6 HOURS AS NEEDED FOR SHORTNESS OF BREATH OR WHEEZING . NEED OFFICE VISIT FOR FURTHER REFILLS.   ipratropium-albuterol  (DUONEB) 0.5-2.5 (3) MG/3ML SOLN, Take 3 mLs by nebulization every 6 (six) hours as needed. Take 3mL by nebulization every 4-6 hours if needed   promethazine  (PHENERGAN ) 25 MG tablet, TAKE 1 TABLET TWICE DAILY AS NEEDED FOR NAUSEA   TRELEGY ELLIPTA  100-62.5-25 MCG/ACT AEPB, INHALE 1 PUFF INTO THE LUNGS DAILY  Current Outpatient Medications (Analgesics):    ibuprofen  (ADVIL ,MOTRIN ) 800 MG tablet, Take 800 mg by mouth every 8 (eight) hours as needed (pain).    rizatriptan (MAXALT) 10 MG tablet, Take 10 mg by mouth  daily.  Current Outpatient Medications (Hematological):    clopidogrel  (  PLAVIX ) 75 MG tablet, TAKE 1 TABLET (75 MG TOTAL) BY MOUTH DAILY.  Current Outpatient Medications (Other):    Blood Glucose Monitoring Suppl (TRUE METRIX METER) w/Device KIT, 1 each.   DROPLET PEN NEEDLES 32G X 4 MM MISC, 100 each.   FLUoxetine  (PROZAC ) 40 MG capsule, Take 80 mg by mouth at bedtime.    hyoscyamine (LEVSIN) 0.125 MG tablet, Take 1 tablet (0.125 mg total) by mouth every 6 (six) hours as needed for cramping.   Insulin  Pen Needle (DROPLET PEN NEEDLES) 32G X 4 MM MISC, 100 each.   metoCLOPramide  (REGLAN ) 10 MG tablet, TAKE 1 TABLET EVERY 8 HOURS AS NEEDED FOR NAUSEA   nystatin -triamcinolone  (MYCOLOG II) cream, Apply 1 application topically 2 (two) times daily as needed (yeast in skin folds).    pantoprazole  (PROTONIX ) 40 MG tablet, Take 1 tablet (40 mg total) by mouth 2 (two) times daily.   pentosan polysulfate (ELMIRON) 100 MG capsule, Take 100 mg by mouth 3 (three) times daily.   Respiratory Therapy Supplies (NEBULIZER MASK ADULT/TUBING) MISC, 1 each by Does not apply route as directed.   tiZANidine  (ZANAFLEX ) 4 MG tablet, Take 4 mg by mouth 3 (three) times daily. May hold one dose if needed to operate motor vehicle.   TRUE METRIX BLOOD GLUCOSE TEST test strip, 1 each by Other route as needed.   TRUEplus Lancets 33G MISC, 100 each.   valACYclovir (VALTREX) 1000 MG tablet, Take 1,000 mg by mouth daily.   linaclotide  (LINZESS ) 290 MCG CAPS capsule, Take 1 capsule (290 mcg total) by mouth daily before breakfast.  Medical History:  Past Medical History:  Diagnosis Date   Allergy    Anxiety    Asthma    Cancer (HCC)    melanoma 2015 upper Left arm    Chronic airway obstruction, not elsewhere classified    Chronic pain    q where; herniated disc in my tailbone (02/09/2013)   DDD (degenerative disc disease)    CERVIAL AND LUMBAR   Depression    Edema    Fatty liver disease, nonalcoholic     Fibromyalgia    severe (02/09/2013)   Gastroparesis    from DM and chronic narcotic use   GERD (gastroesophageal reflux disease)    Human parvovirus infection    Hyperlipidemia    Hypertension    Hypothyroidism    Interstitial cystitis    Migraine headache    weekly; worse lately (02/09/2013)   OSA (obstructive sleep apnea)    have mask;; don't use it; no one came out to check it (02/09/2013)   Osteoporosis    Peripheral neuropathy    severe (02/09/2013)   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   PONV (postoperative nausea and vomiting)    Positive PPD    Shortness of breath    at anytime; it's gotten worse recently (02/09/2013)   Sleep apnea    Type II diabetes mellitus (HCC)    Vitamin D  deficiency    Vocal cord dysfunction    Allergies:  Allergies  Allergen Reactions   Influenza Vaccines Anaphylaxis and Swelling    Throat swelling, flu symptoms   Iodinated Contrast Media Shortness Of Breath, Nausea And Vomiting, Nausea Only and Rash   Iodine-131 Shortness Of Breath, Nausea And Vomiting and Rash   Iohexol  Anaphylaxis, Hives and Swelling     Desc: hives,dyspnea; throat swelling; ok w/ premeds and omnipaque     Levofloxacin Anaphylaxis and Hives   Nucynta [  Tapentadol Hydrochloride] Other (See Comments)    Hallucinations and  insomnia x3 days   Pregabalin Other (See Comments)    (lyrica)Reaction-Syncope & unable to speak   Sulfonamide Derivatives Anaphylaxis   Vantin  [Cefpodoxime ] Anaphylaxis, Hives, Rash and Cough   Vilazodone Hcl Other (See Comments)    (Viibryd) Hallucinations   Amoxicillin  Hives and Itching    Did it involve swelling of the face/tongue/throat, SOB, or low BP? No Did it involve sudden or severe rash/hives, skin peeling, or any reaction on the inside of your mouth or nose? Yes Did you need to seek medical attention at a hospital or doctor's office? No When did it last happen?3-4 years ago If all  above answers are "NO", may proceed with cephalosporin use. .   Biaxin [Clarithromycin] Hives   Carbamazepine Itching    (Tegretol)   Ciprofloxacin Hives   Dilaudid  [Hydromorphone  Hcl] Itching and Rash    redness   Iodides Nausea Only   Percocet [Oxycodone -Acetaminophen ] Rash     Surgical History:  She  has a past surgical history that includes Abdominal hysterectomy (1993); Cholecystectomy (?1990); Nasal sinus surgery (1986; 1990's); Knee arthroscopy (Left, 1990's); Hip surgery (Right, 2002); Shoulder arthroscopy w/ rotator cuff repair (Right); Anterior cervical decomp/discectomy fusion (2004); Carpal tunnel release (Bilateral, ?1980's); RIGHT/LEFT HEART CATH AND CORONARY ANGIOGRAPHY (N/A, 08/26/2017); Colonoscopy; arm surgery; Bronchoscopy (06/28/2019); Video bronchoscopy (Bilateral, 06/28/2019); TEE without cardioversion (N/A, 03/27/2020); Bubble study (03/27/2020); and LOOP RECORDER INSERTION (N/A, 03/27/2020). Family History:  Her family history includes Breast cancer in her cousin, cousin, and maternal aunt; Coronary artery disease in her father; Diabetes in her brother and brother.  REVIEW OF SYSTEMS  : All other systems reviewed and negative except where noted in the History of Present Illness.  PHYSICAL EXAM: BP 130/78 (BP Location: Left Arm, Patient Position: Sitting, Cuff Size: Normal)   Pulse 62   Ht 5' 2 (1.575 m)   Wt 183 lb (83 kg)   BMI 33.47 kg/m  Physical Exam   GENERAL APPEARANCE: Well nourished, in no apparent distress. HEENT: No cervical lymphadenopathy, unremarkable thyroid , sclerae anicteric, conjunctiva pink. RESPIRATORY: Respiratory effort normal, breath sounds equal bilateral without rales, rhonchi, or wheezing. CARDIO: Regular rate and rhythm with no murmurs, rubs, or gallops, peripheral pulses intact. ABDOMEN: Soft, non-distended, active bowel sounds in all four quadrants. Right lower abdominal pain on palpation, pain on deep palpation, positive Carnett's sign,  no rebound tenderness, no costovertebral angle tenderness, no pain on abdominal twisting. RECTAL: Declines examination. MUSCULOSKELETAL: Full range of motion, normal gait, without edema. Pain on right hip rotation, movement, and flexion against resistance. SKIN: Dry, intact without rashes or lesions. No jaundice. NEURO: Alert, oriented, no focal deficits. PSYCH: Cooperative, normal mood and affect.      Alan JONELLE Coombs, PA-C 3:10 PM

## 2024-02-12 NOTE — Progress Notes (Signed)
 I agree with the assessment and plan as outlined by Ms. Steffanie Dunn.

## 2024-02-12 NOTE — Patient Instructions (Addendum)
 Your provider has requested that you go to the basement level for lab work before leaving today. Press B on the elevator. The lab is located at the first door on the left as you exit the elevator.  Your provider has requested that you have an abdominal x ray before leaving today. Please go to the basement floor to our Radiology department for the test.  VISIT SUMMARY:  Today, you were seen for worsening abdominal pain that you have been experiencing for over a year, with significant worsening in the past few weeks. You also reported nausea and vomiting associated with the pain. We discussed your history of reflux and probable gastroparesis, and reviewed your current medications and symptoms.  YOUR PLAN:  -ABDOMINAL PAIN WITH NAUSEA AND VOMITING: Your chronic right lower abdominal pain has worsened recently and is associated with nausea and vomiting. The positive Carnett's sign suggests the pain may be coming from the abdominal wall. We will order an abdominal x-ray and a right hip x-ray to investigate further. You have been prescribed hyoscyamine for quick relief. We will also check your labs before considering a CT scan of your abdomen and pelvis with contrast if needed. If a CT scan with contrast is performed, we will administer allergy prophylaxis.  -CONSTIPATION: You have been experiencing constipation with small volume, formed stools, which may be contributing to your abdominal pain. You have been out of Linzess  for a couple of weeks, which previously helped manage your symptoms. We have provided you with Linzess  samples and will prescribe Linzess  to help relieve your constipation.  -GASTROESOPHAGEAL REFLUX DISEASE (GERD): Your GERD symptoms have improved since you stopped taking Mounjaro. There are no current upper GI symptoms that need to be addressed.  -GASTROPARESIS: Your gastroparesis symptoms have improved since you stopped taking Mounjaro. There is no current exacerbation of symptoms.  We will discontinue metoclopramide  due to its lack of efficacy and the risk of side effects.  -RIGHT-SIDED KIDNEY OBSTRUCTION: You had a right-sided kidney obstruction in 2021, but you are not experiencing any current urinary symptoms. We will check for any urinary issues during your lab work.  INSTRUCTIONS:  Please follow up with the lab work and imaging studies as discussed. If your symptoms persist or worsen, contact our office. Continue taking your prescribed medications as directed.

## 2024-02-13 LAB — URINE CULTURE
MICRO NUMBER:: 16717574
Result:: NO GROWTH
SPECIMEN QUALITY:: ADEQUATE

## 2024-02-15 ENCOUNTER — Ambulatory Visit: Payer: Self-pay | Admitting: Physician Assistant

## 2024-02-15 DIAGNOSIS — K5904 Chronic idiopathic constipation: Secondary | ICD-10-CM

## 2024-02-15 DIAGNOSIS — R1031 Right lower quadrant pain: Secondary | ICD-10-CM

## 2024-02-16 ENCOUNTER — Ambulatory Visit (INDEPENDENT_AMBULATORY_CARE_PROVIDER_SITE_OTHER)
Admission: RE | Admit: 2024-02-16 | Discharge: 2024-02-16 | Disposition: A | Discharge status: Home / Self Care | Source: Ambulatory Visit | Attending: Physician Assistant | Admitting: Physician Assistant

## 2024-02-16 ENCOUNTER — Other Ambulatory Visit: Payer: Self-pay | Admitting: Physician Assistant

## 2024-02-16 ENCOUNTER — Ambulatory Visit: Payer: Self-pay | Admitting: Physician Assistant

## 2024-02-16 DIAGNOSIS — R140 Abdominal distension (gaseous): Secondary | ICD-10-CM | POA: Diagnosis not present

## 2024-02-16 DIAGNOSIS — M25551 Pain in right hip: Secondary | ICD-10-CM | POA: Diagnosis not present

## 2024-02-16 DIAGNOSIS — R1031 Right lower quadrant pain: Secondary | ICD-10-CM

## 2024-02-16 DIAGNOSIS — R110 Nausea: Secondary | ICD-10-CM | POA: Diagnosis not present

## 2024-02-16 DIAGNOSIS — K5900 Constipation, unspecified: Secondary | ICD-10-CM | POA: Diagnosis not present

## 2024-02-16 DIAGNOSIS — K5904 Chronic idiopathic constipation: Secondary | ICD-10-CM

## 2024-02-17 ENCOUNTER — Ambulatory Visit: Payer: Self-pay | Admitting: Physician Assistant

## 2024-02-25 DIAGNOSIS — E1142 Type 2 diabetes mellitus with diabetic polyneuropathy: Secondary | ICD-10-CM | POA: Diagnosis not present

## 2024-02-25 DIAGNOSIS — E039 Hypothyroidism, unspecified: Secondary | ICD-10-CM | POA: Diagnosis not present

## 2024-02-25 DIAGNOSIS — J4530 Mild persistent asthma, uncomplicated: Secondary | ICD-10-CM | POA: Diagnosis not present

## 2024-02-25 DIAGNOSIS — J441 Chronic obstructive pulmonary disease with (acute) exacerbation: Secondary | ICD-10-CM | POA: Diagnosis not present

## 2024-02-25 DIAGNOSIS — E1165 Type 2 diabetes mellitus with hyperglycemia: Secondary | ICD-10-CM | POA: Diagnosis not present

## 2024-02-25 DIAGNOSIS — I10 Essential (primary) hypertension: Secondary | ICD-10-CM | POA: Diagnosis not present

## 2024-02-28 DIAGNOSIS — I10 Essential (primary) hypertension: Secondary | ICD-10-CM | POA: Diagnosis not present

## 2024-02-28 DIAGNOSIS — E1142 Type 2 diabetes mellitus with diabetic polyneuropathy: Secondary | ICD-10-CM | POA: Diagnosis not present

## 2024-02-28 DIAGNOSIS — E1165 Type 2 diabetes mellitus with hyperglycemia: Secondary | ICD-10-CM | POA: Diagnosis not present

## 2024-02-28 DIAGNOSIS — J441 Chronic obstructive pulmonary disease with (acute) exacerbation: Secondary | ICD-10-CM | POA: Diagnosis not present

## 2024-03-10 ENCOUNTER — Other Ambulatory Visit: Payer: Self-pay | Admitting: Physician Assistant

## 2024-03-10 ENCOUNTER — Ambulatory Visit (INDEPENDENT_AMBULATORY_CARE_PROVIDER_SITE_OTHER)

## 2024-03-10 DIAGNOSIS — I639 Cerebral infarction, unspecified: Secondary | ICD-10-CM | POA: Diagnosis not present

## 2024-03-10 LAB — CUP PACEART REMOTE DEVICE CHECK
Date Time Interrogation Session: 20250813230955
Implantable Pulse Generator Implant Date: 20210831

## 2024-03-12 ENCOUNTER — Ambulatory Visit: Payer: Self-pay | Admitting: Cardiology

## 2024-03-17 ENCOUNTER — Encounter

## 2024-03-27 DIAGNOSIS — E1165 Type 2 diabetes mellitus with hyperglycemia: Secondary | ICD-10-CM | POA: Diagnosis not present

## 2024-03-27 DIAGNOSIS — J441 Chronic obstructive pulmonary disease with (acute) exacerbation: Secondary | ICD-10-CM | POA: Diagnosis not present

## 2024-03-27 DIAGNOSIS — E1142 Type 2 diabetes mellitus with diabetic polyneuropathy: Secondary | ICD-10-CM | POA: Diagnosis not present

## 2024-03-27 DIAGNOSIS — I10 Essential (primary) hypertension: Secondary | ICD-10-CM | POA: Diagnosis not present

## 2024-03-27 DIAGNOSIS — E039 Hypothyroidism, unspecified: Secondary | ICD-10-CM | POA: Diagnosis not present

## 2024-03-27 DIAGNOSIS — J4530 Mild persistent asthma, uncomplicated: Secondary | ICD-10-CM | POA: Diagnosis not present

## 2024-03-29 ENCOUNTER — Other Ambulatory Visit (HOSPITAL_BASED_OUTPATIENT_CLINIC_OR_DEPARTMENT_OTHER): Payer: Self-pay | Admitting: Family Medicine

## 2024-03-29 ENCOUNTER — Ambulatory Visit (HOSPITAL_BASED_OUTPATIENT_CLINIC_OR_DEPARTMENT_OTHER)
Admission: RE | Admit: 2024-03-29 | Discharge: 2024-03-29 | Disposition: A | Discharge status: Home / Self Care | Source: Ambulatory Visit | Attending: Family Medicine | Admitting: Family Medicine

## 2024-03-29 DIAGNOSIS — K219 Gastro-esophageal reflux disease without esophagitis: Secondary | ICD-10-CM | POA: Diagnosis not present

## 2024-03-29 DIAGNOSIS — S0083XA Contusion of other part of head, initial encounter: Secondary | ICD-10-CM | POA: Insufficient documentation

## 2024-03-29 DIAGNOSIS — E1142 Type 2 diabetes mellitus with diabetic polyneuropathy: Secondary | ICD-10-CM | POA: Diagnosis not present

## 2024-03-29 DIAGNOSIS — J441 Chronic obstructive pulmonary disease with (acute) exacerbation: Secondary | ICD-10-CM | POA: Diagnosis not present

## 2024-03-29 DIAGNOSIS — G894 Chronic pain syndrome: Secondary | ICD-10-CM | POA: Diagnosis not present

## 2024-03-29 DIAGNOSIS — F418 Other specified anxiety disorders: Secondary | ICD-10-CM | POA: Diagnosis not present

## 2024-03-29 DIAGNOSIS — E039 Hypothyroidism, unspecified: Secondary | ICD-10-CM | POA: Diagnosis not present

## 2024-03-29 DIAGNOSIS — Z85820 Personal history of malignant melanoma of skin: Secondary | ICD-10-CM | POA: Diagnosis not present

## 2024-03-29 DIAGNOSIS — I10 Essential (primary) hypertension: Secondary | ICD-10-CM | POA: Diagnosis not present

## 2024-03-29 DIAGNOSIS — Z6833 Body mass index (BMI) 33.0-33.9, adult: Secondary | ICD-10-CM | POA: Diagnosis not present

## 2024-03-29 DIAGNOSIS — E782 Mixed hyperlipidemia: Secondary | ICD-10-CM | POA: Diagnosis not present

## 2024-03-29 DIAGNOSIS — E1165 Type 2 diabetes mellitus with hyperglycemia: Secondary | ICD-10-CM | POA: Diagnosis not present

## 2024-04-11 ENCOUNTER — Ambulatory Visit (INDEPENDENT_AMBULATORY_CARE_PROVIDER_SITE_OTHER)

## 2024-04-11 DIAGNOSIS — I639 Cerebral infarction, unspecified: Secondary | ICD-10-CM | POA: Diagnosis not present

## 2024-04-12 ENCOUNTER — Ambulatory Visit: Payer: Self-pay | Admitting: Cardiology

## 2024-04-12 LAB — CUP PACEART REMOTE DEVICE CHECK
Date Time Interrogation Session: 20250914231811
Implantable Pulse Generator Implant Date: 20210831

## 2024-04-18 ENCOUNTER — Encounter

## 2024-04-18 NOTE — Progress Notes (Signed)
 Remote Loop Recorder Transmission

## 2024-04-20 NOTE — Progress Notes (Signed)
 Remote Loop Recorder Transmission

## 2024-04-26 DIAGNOSIS — E1142 Type 2 diabetes mellitus with diabetic polyneuropathy: Secondary | ICD-10-CM | POA: Diagnosis not present

## 2024-04-26 DIAGNOSIS — J4530 Mild persistent asthma, uncomplicated: Secondary | ICD-10-CM | POA: Diagnosis not present

## 2024-04-26 DIAGNOSIS — J441 Chronic obstructive pulmonary disease with (acute) exacerbation: Secondary | ICD-10-CM | POA: Diagnosis not present

## 2024-04-26 DIAGNOSIS — I10 Essential (primary) hypertension: Secondary | ICD-10-CM | POA: Diagnosis not present

## 2024-04-26 DIAGNOSIS — E1165 Type 2 diabetes mellitus with hyperglycemia: Secondary | ICD-10-CM | POA: Diagnosis not present

## 2024-04-26 DIAGNOSIS — E039 Hypothyroidism, unspecified: Secondary | ICD-10-CM | POA: Diagnosis not present

## 2024-04-28 DIAGNOSIS — I10 Essential (primary) hypertension: Secondary | ICD-10-CM | POA: Diagnosis not present

## 2024-04-28 DIAGNOSIS — E1165 Type 2 diabetes mellitus with hyperglycemia: Secondary | ICD-10-CM | POA: Diagnosis not present

## 2024-04-28 DIAGNOSIS — J441 Chronic obstructive pulmonary disease with (acute) exacerbation: Secondary | ICD-10-CM | POA: Diagnosis not present

## 2024-04-28 DIAGNOSIS — E1142 Type 2 diabetes mellitus with diabetic polyneuropathy: Secondary | ICD-10-CM | POA: Diagnosis not present

## 2024-05-04 NOTE — Progress Notes (Signed)
 Remote Loop Recorder Transmission

## 2024-05-11 ENCOUNTER — Ambulatory Visit

## 2024-05-11 DIAGNOSIS — I639 Cerebral infarction, unspecified: Secondary | ICD-10-CM

## 2024-05-11 DIAGNOSIS — N644 Mastodynia: Secondary | ICD-10-CM | POA: Diagnosis not present

## 2024-05-12 ENCOUNTER — Encounter

## 2024-05-12 LAB — CUP PACEART REMOTE DEVICE CHECK
Date Time Interrogation Session: 20251014230311
Implantable Pulse Generator Implant Date: 20210831

## 2024-05-13 NOTE — Progress Notes (Signed)
 Remote Loop Recorder Transmission

## 2024-05-18 ENCOUNTER — Ambulatory Visit: Payer: Self-pay | Admitting: Cardiology

## 2024-05-19 ENCOUNTER — Encounter

## 2024-05-20 ENCOUNTER — Other Ambulatory Visit: Payer: Self-pay | Admitting: Internal Medicine

## 2024-05-27 DIAGNOSIS — J441 Chronic obstructive pulmonary disease with (acute) exacerbation: Secondary | ICD-10-CM | POA: Diagnosis not present

## 2024-05-27 DIAGNOSIS — E1165 Type 2 diabetes mellitus with hyperglycemia: Secondary | ICD-10-CM | POA: Diagnosis not present

## 2024-05-27 DIAGNOSIS — I10 Essential (primary) hypertension: Secondary | ICD-10-CM | POA: Diagnosis not present

## 2024-05-27 DIAGNOSIS — E039 Hypothyroidism, unspecified: Secondary | ICD-10-CM | POA: Diagnosis not present

## 2024-05-27 DIAGNOSIS — J4530 Mild persistent asthma, uncomplicated: Secondary | ICD-10-CM | POA: Diagnosis not present

## 2024-05-27 DIAGNOSIS — E1142 Type 2 diabetes mellitus with diabetic polyneuropathy: Secondary | ICD-10-CM | POA: Diagnosis not present

## 2024-05-28 DIAGNOSIS — E1165 Type 2 diabetes mellitus with hyperglycemia: Secondary | ICD-10-CM | POA: Diagnosis not present

## 2024-05-28 DIAGNOSIS — I10 Essential (primary) hypertension: Secondary | ICD-10-CM | POA: Diagnosis not present

## 2024-05-28 DIAGNOSIS — J441 Chronic obstructive pulmonary disease with (acute) exacerbation: Secondary | ICD-10-CM | POA: Diagnosis not present

## 2024-05-28 DIAGNOSIS — E1142 Type 2 diabetes mellitus with diabetic polyneuropathy: Secondary | ICD-10-CM | POA: Diagnosis not present

## 2024-06-06 DIAGNOSIS — K3184 Gastroparesis: Secondary | ICD-10-CM | POA: Diagnosis not present

## 2024-06-06 DIAGNOSIS — E1165 Type 2 diabetes mellitus with hyperglycemia: Secondary | ICD-10-CM | POA: Diagnosis not present

## 2024-06-06 DIAGNOSIS — I10 Essential (primary) hypertension: Secondary | ICD-10-CM | POA: Diagnosis not present

## 2024-06-06 DIAGNOSIS — E1142 Type 2 diabetes mellitus with diabetic polyneuropathy: Secondary | ICD-10-CM | POA: Diagnosis not present

## 2024-06-06 DIAGNOSIS — Z794 Long term (current) use of insulin: Secondary | ICD-10-CM | POA: Diagnosis not present

## 2024-06-06 DIAGNOSIS — E039 Hypothyroidism, unspecified: Secondary | ICD-10-CM | POA: Diagnosis not present

## 2024-06-08 ENCOUNTER — Other Ambulatory Visit: Payer: Self-pay | Admitting: Physician Assistant

## 2024-06-11 ENCOUNTER — Encounter

## 2024-06-12 ENCOUNTER — Ambulatory Visit: Attending: Cardiology

## 2024-06-12 DIAGNOSIS — I639 Cerebral infarction, unspecified: Secondary | ICD-10-CM

## 2024-06-13 ENCOUNTER — Encounter

## 2024-06-13 LAB — CUP PACEART REMOTE DEVICE CHECK
Date Time Interrogation Session: 20251115230204
Implantable Pulse Generator Implant Date: 20210831

## 2024-06-14 ENCOUNTER — Ambulatory Visit: Payer: Self-pay | Admitting: Cardiology

## 2024-06-14 NOTE — Progress Notes (Signed)
 Remote Loop Recorder Transmission

## 2024-06-20 ENCOUNTER — Encounter

## 2024-06-22 ENCOUNTER — Institutional Professional Consult (permissible substitution): Admitting: Neurology

## 2024-06-26 DIAGNOSIS — J4530 Mild persistent asthma, uncomplicated: Secondary | ICD-10-CM | POA: Diagnosis not present

## 2024-06-26 DIAGNOSIS — E039 Hypothyroidism, unspecified: Secondary | ICD-10-CM | POA: Diagnosis not present

## 2024-06-26 DIAGNOSIS — E1165 Type 2 diabetes mellitus with hyperglycemia: Secondary | ICD-10-CM | POA: Diagnosis not present

## 2024-06-26 DIAGNOSIS — I10 Essential (primary) hypertension: Secondary | ICD-10-CM | POA: Diagnosis not present

## 2024-06-26 DIAGNOSIS — E1142 Type 2 diabetes mellitus with diabetic polyneuropathy: Secondary | ICD-10-CM | POA: Diagnosis not present

## 2024-06-26 DIAGNOSIS — J441 Chronic obstructive pulmonary disease with (acute) exacerbation: Secondary | ICD-10-CM | POA: Diagnosis not present

## 2024-07-05 ENCOUNTER — Other Ambulatory Visit: Payer: Self-pay

## 2024-07-05 ENCOUNTER — Emergency Department (HOSPITAL_COMMUNITY)
Admission: EM | Admit: 2024-07-05 | Discharge: 2024-07-06 | Disposition: A | Attending: Emergency Medicine | Admitting: Emergency Medicine

## 2024-07-05 ENCOUNTER — Emergency Department (HOSPITAL_COMMUNITY)

## 2024-07-05 ENCOUNTER — Encounter (HOSPITAL_COMMUNITY): Payer: Self-pay | Admitting: *Deleted

## 2024-07-05 LAB — CBC WITH DIFFERENTIAL/PLATELET
Abs Immature Granulocytes: 0.1 K/uL — ABNORMAL HIGH (ref 0.00–0.07)
Basophils Absolute: 0.1 K/uL (ref 0.0–0.1)
Basophils Relative: 1 %
Eosinophils Absolute: 0.2 K/uL (ref 0.0–0.5)
Eosinophils Relative: 2 %
HCT: 38.5 % (ref 36.0–46.0)
Hemoglobin: 13.5 g/dL (ref 12.0–15.0)
Immature Granulocytes: 1 %
Lymphocytes Relative: 26 %
Lymphs Abs: 3.6 K/uL (ref 0.7–4.0)
MCH: 29.9 pg (ref 26.0–34.0)
MCHC: 35.1 g/dL (ref 30.0–36.0)
MCV: 85.4 fL (ref 80.0–100.0)
Monocytes Absolute: 0.9 K/uL (ref 0.1–1.0)
Monocytes Relative: 6 %
Neutro Abs: 9 K/uL — ABNORMAL HIGH (ref 1.7–7.7)
Neutrophils Relative %: 64 %
Platelets: 424 K/uL — ABNORMAL HIGH (ref 150–400)
RBC: 4.51 MIL/uL (ref 3.87–5.11)
RDW: 13.1 % (ref 11.5–15.5)
WBC: 13.8 K/uL — ABNORMAL HIGH (ref 4.0–10.5)
nRBC: 0 % (ref 0.0–0.2)

## 2024-07-05 LAB — URINALYSIS, W/ REFLEX TO CULTURE (INFECTION SUSPECTED)
Bilirubin Urine: NEGATIVE
Glucose, UA: >=500 mg/dL — AB
Ketones, ur: NEGATIVE mg/dL
Nitrite: NEGATIVE
Protein, ur: 100 mg/dL — AB
Specific Gravity, Urine: 1.022 (ref 1.005–1.030)
WBC, UA: >50 WBC/hpf (ref 0–5)
pH: 5 (ref 5.0–8.0)

## 2024-07-05 LAB — COMPREHENSIVE METABOLIC PANEL WITH GFR
ALT: 18 U/L (ref 0–44)
AST: 14 U/L — ABNORMAL LOW (ref 15–41)
Albumin: 4.3 g/dL (ref 3.5–5.0)
Alkaline Phosphatase: 73 U/L (ref 38–126)
Anion gap: 11 (ref 5–15)
BUN: 22 mg/dL — ABNORMAL HIGH (ref 6–20)
CO2: 14 mmol/L — ABNORMAL LOW (ref 22–32)
Calcium: 9.5 mg/dL (ref 8.9–10.3)
Chloride: 109 mmol/L (ref 98–111)
Creatinine, Ser: 1.26 mg/dL — ABNORMAL HIGH (ref 0.44–1.00)
GFR, Estimated: 50 mL/min — ABNORMAL LOW (ref >60)
Glucose, Bld: 346 mg/dL — ABNORMAL HIGH (ref 70–99)
Potassium: 3.4 mmol/L — ABNORMAL LOW (ref 3.5–5.1)
Sodium: 134 mmol/L — ABNORMAL LOW (ref 135–145)
Total Bilirubin: 0.6 mg/dL (ref 0.0–1.2)
Total Protein: 7.8 g/dL (ref 6.5–8.1)

## 2024-07-05 LAB — I-STAT CG4 LACTIC ACID, ED: Lactic Acid, Venous: 1 mmol/L (ref 0.5–1.9)

## 2024-07-05 LAB — CBG MONITORING, ED: Glucose-Capillary: 342 mg/dL — ABNORMAL HIGH (ref 70–99)

## 2024-07-05 NOTE — ED Triage Notes (Signed)
 Pt had dental extractions (4-5 teeth on left side) and she had her post op appointment today and the DDS advised her to come to ED for evaluation due to infection/dental abscess which was drained.  Pt is taking DM meds as prescribed and CBG was 300 today.

## 2024-07-05 NOTE — ED Triage Notes (Addendum)
 Pt c/o dental pain on both sides of lower jaw, left is worse. Pt seen at dentist and told she had a dental infection and advised to come to ER for antibiotics. Also told her CBG is 300s and needed to come get her sugar controlled.

## 2024-07-05 NOTE — ED Provider Triage Note (Signed)
 Emergency Medicine Provider Triage Evaluation Note  Cheryl [name] , a 58 y.o. female  was evaluated in triage.  Pt complains of dental pain. Pt had a dental surgery to remove teeth on 12/3. She then followed up with her dentist who was concerned about her healing due to her DM. She is now having left sided jaw pain into her next. She denies any difficulty swallowing or breathing.  Review of Systems  Positive: Dental pain Negative: Chest pain  Physical Exam  BP (!) 131/54 (BP Location: Right Arm)   Pulse 67   Temp 98.9 F (37.2 C) (Oral)   Resp 20   SpO2 100%  Gen:   Awake, no distress   Resp:  Normal effort  MSK:   Moves extremities without difficulty  Other:  Appears to have teeth removed with an open pocket  Medical Decision Making  Medically screening exam initiated at 4:02 PM.  Appropriate orders placed.  Cheryl [name] was informed that the remainder of the evaluation will be completed by another provider, this initial triage assessment does not replace that evaluation, and the importance of remaining in the ED until their evaluation is complete.     Rosaline Almarie MATSU, [place] 07/05/24 1605

## 2024-07-05 NOTE — ED Notes (Signed)
 Per PA in triage, 2nd istat lactic not needed. KIT

## 2024-07-06 ENCOUNTER — Emergency Department (HOSPITAL_COMMUNITY)

## 2024-07-06 LAB — CBG MONITORING, ED
Glucose-Capillary: 264 mg/dL — ABNORMAL HIGH (ref 70–99)
Glucose-Capillary: 222 mg/dL — ABNORMAL HIGH (ref 70–99)
Glucose-Capillary: 234 mg/dL — ABNORMAL HIGH (ref 70–99)
Glucose-Capillary: 252 mg/dL — ABNORMAL HIGH (ref 70–99)
Glucose-Capillary: 246 mg/dL — ABNORMAL HIGH (ref 70–99)
Glucose-Capillary: 277 mg/dL — ABNORMAL HIGH (ref 70–99)

## 2024-07-06 LAB — I-STAT CG4 LACTIC ACID, ED: Lactic Acid, Venous: 0.9 mmol/L (ref 0.5–1.9)

## 2024-07-06 MED ORDER — IOHEXOL 350 MG/ML SOLN
75.0000 mL | Freq: Once | INTRAVENOUS | Status: AC | PRN
  Administered 2024-07-06: 05:00:00 75 mL via INTRAVENOUS

## 2024-07-06 MED ORDER — INSULIN ASPART 100 UNIT/ML IJ SOLN
8.0000 [IU] | Freq: Once | INTRAMUSCULAR | Status: AC
  Administered 2024-07-06: 8 [IU] via SUBCUTANEOUS
  Filled 2024-07-06: qty 8

## 2024-07-06 MED ORDER — METHYLPREDNISOLONE SODIUM SUCC 40 MG IJ SOLR
40.0000 mg | Freq: Once | INTRAMUSCULAR | Status: AC
  Administered 2024-07-06: 40 mg via INTRAVENOUS
  Filled 2024-07-06: qty 1

## 2024-07-06 MED ORDER — AZITHROMYCIN 250 MG PO TABS: 250.0000 mg | ORAL_TABLET | Freq: Every day | ORAL | 0 refills | Status: DC

## 2024-07-06 MED ORDER — METRONIDAZOLE 500 MG PO TABS: 500.0000 mg | ORAL_TABLET | Freq: Two times a day (BID) | ORAL | 0 refills | Status: DC

## 2024-07-06 MED ORDER — AZITHROMYCIN 250 MG PO TABS
500.0000 mg | ORAL_TABLET | Freq: Once | ORAL | Status: AC
  Administered 2024-07-06: 500 mg via ORAL
  Filled 2024-07-06: qty 2

## 2024-07-06 MED ORDER — SODIUM CHLORIDE 0.9 % IV BOLUS
1000.0000 mL | Freq: Once | INTRAVENOUS | Status: AC
  Administered 2024-07-06: 1000 mL via INTRAVENOUS

## 2024-07-06 MED ORDER — DIPHENHYDRAMINE HCL 50 MG/ML IJ SOLN
50.0000 mg | Freq: Once | INTRAMUSCULAR | Status: AC
  Filled 2024-07-06: qty 1

## 2024-07-06 MED ORDER — METRONIDAZOLE 500 MG PO TABS
500.0000 mg | ORAL_TABLET | Freq: Once | ORAL | Status: AC
  Administered 2024-07-06: 500 mg via ORAL
  Filled 2024-07-06: qty 1

## 2024-07-06 MED ORDER — ACETAMINOPHEN 500 MG PO TABS
1000.0000 mg | ORAL_TABLET | Freq: Once | ORAL | Status: AC
  Administered 2024-07-06: 1000 mg via ORAL
  Filled 2024-07-06: qty 2

## 2024-07-06 MED ORDER — DIPHENHYDRAMINE HCL 25 MG PO CAPS
50.0000 mg | ORAL_CAPSULE | Freq: Once | ORAL | Status: AC
  Administered 2024-07-06: 50 mg via ORAL
  Filled 2024-07-06: qty 2

## 2024-07-06 NOTE — ED Notes (Signed)
 Patient transported to CT scan .

## 2024-07-06 NOTE — ED Provider Notes (Signed)
 [place] EMERGENCY DEPARTMENT AT [facility] Provider Note  CSN: [id number] Arrival date & time: 07/05/24 1514  Chief Complaint(s) Dental Problem  HPI & MDM Cheryl [name] is a 58 y.o. female here for left face pain and swelling.  Patient reports that she had dental extraction a week ago and has been having swelling and pain.  She was seen by her oral surgeon today and had an I&D performed of the abscess.  Due to her elevated sugars and abscess, her surgeon recommended she present for evaluation.  Patient has been on clindamycin for 1 week.  Noted that the swelling of the left face improved after I&D but still having discomfort.  Patient reports that she is on insulin  twice a day and blood sugars are normally in the 1-2 100s.  Blood sugars noted to be in mid 300s over the past several days.  No nausea or vomiting.  No diarrhea.  No abdominal pain.  No chest pain or shortness of breath   The history is provided by the patient.   Left face pain and swelling likely dental abscess.  Patient has a allergy contrast and a CT Noncon of the soft tissue was ordered in triage which showed some soft tissue swelling with loculated gas.  She does have leukocytosis.  Pretreated for her contrast allergy and a CT was repeated.  This showed small cavitated abscess measuring 1.5 x 2 cm.  No evidence of Ludwig's angina.  No other deep tissue infections.  Patient has several antibiotic allergies.  Changed from clindamycin to azithromycin  plus Flagyl .  Recommended she follow-up with the oral surgeon for continued management of the abscess itself.  No need for admission regarding this.  Patient did have hyperglycemia without evidence of DKA.  This improved after IV fluids and dose of regular insulin .   Medical Decision Making Amount and/or Complexity of Data Reviewed Labs: ordered. Decision-making details documented in ED Course. Radiology: ordered and independent interpretation performed.  Decision-making details documented in ED Course.  Risk OTC drugs. Prescription drug management.    Final Clinical Impression(s) / ED Diagnoses Final diagnoses:  Dental abscess   The patient appears reasonably screened and/or stabilized for discharge and I doubt any other medical condition or other [facility] requiring further screening, evaluation, or treatment in the ED at this time. I have discussed the findings, Dx and Tx plan with the patient/family who expressed understanding and agree(s) with the plan. Discharge instructions discussed at length. The patient/family was given strict return precautions who verbalized understanding of the instructions. No further questions at time of discharge.  Disposition: Discharge  Condition: Good  ED Discharge Orders          Ordered    azithromycin  (ZITHROMAX  Z-PAK) 250 MG tablet  Daily        07/06/24 0654    metroNIDAZOLE  (FLAGYL ) 500 MG tablet  2 times daily        07/06/24 0654             Follow Up: Frederik Charleston, MD [address] [place] [id number] [phone]  Call  to schedule an appointment for close follow up  Oral surgeon  Call  to schedule an appointment for close follow up     Past Medical History Past Medical History:  Diagnosis Date   Allergy    Anxiety    Asthma    Cancer (HCC)    melanoma 2015 upper Left arm    Chronic airway obstruction,  not elsewhere classified    Chronic pain    q where; herniated disc in my tailbone (02/09/2013)   DDD (degenerative disc disease)    CERVIAL AND LUMBAR   Depression    Edema    Fatty liver disease, nonalcoholic    Fibromyalgia    severe (02/09/2013)   Gastroparesis    from DM and chronic narcotic use   GERD (gastroesophageal reflux disease)    Human parvovirus infection    Hyperlipidemia    Hypertension    Hypothyroidism    Interstitial cystitis    Migraine headache    weekly; worse lately (02/09/2013)   OSA (obstructive sleep apnea)     have mask;; don't use it; no one came out to check it (02/09/2013)   Osteoporosis    Peripheral neuropathy    severe (02/09/2013)   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   PONV (postoperative nausea and vomiting)    Positive PPD    Shortness of breath    at anytime; it's gotten worse recently (02/09/2013)   Sleep apnea    Type II diabetes mellitus (HCC)    Vitamin D  deficiency    Vocal cord dysfunction    Patient Active Problem List   Diagnosis Date Noted   Acute metabolic encephalopathy 03/24/2020   Stroke due to occlusion of left middle cerebral artery (HCC) 03/24/2020   Mixed diabetic hyperlipidemia associated with type 2 diabetes mellitus (HCC) 03/24/2020   Belching 10/11/2019   Abdominal pain, epigastric 10/11/2019   Bloating 10/11/2019   Loss of weight 10/11/2019   Early satiety 10/11/2019   Pneumothorax on right 06/28/2019   ILD (interstitial lung disease) (HCC)    Acute respiratory failure (HCC)    Medication management 06/06/2019   Lobar pneumonia, unspecified organism 05/26/2019   Fever 05/26/2019   Dysphagia 04/30/2018   Hoarseness 04/30/2018   Chest pain at rest 08/26/2017   Dyspnea 08/26/2017   OSA (obstructive sleep apnea) 01/05/2017   H/O melanoma excision 02/11/2016   Breast nodule 02/11/2016   Nausea without vomiting 12/12/2015   Leucocytosis 12/12/2015   Thrush 12/12/2015   Abdominal pain 12/12/2015   Thoracic aorta atherosclerosis 06/06/2015   Chest wall pain 12/25/2014   Snoring 06/29/2014   Obesity, unspecified 02/04/2014   DKA, type 2 (HCC) 02/03/2014   Hypothyroidism 03/02/2013   Insulin  dependent type 2 diabetes mellitus (HCC) 03/01/2013   Hypoglycemia 02/09/2013   Hypokalemia 02/09/2013   Thrush of mouth and esophagus (HCC) 09/12/2012   Obesity 05/25/2012   Cough due to angiotensin-converting enzyme inhibitor 07/12/2011   DDD (degenerative disc disease)    Edema    Human parvovirus  infection    Polyarthropathy associated with another disorder    Fatty liver disease, nonalcoholic    Migraine headache    Positive PPD    Vitamin D  deficiency    Vocal cord dysfunction    RHINOSINUSITIS, 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   Essential hypertension 06/06/2009   UNSPECIFIED TACHYCARDIA 06/06/2009   ABNORMAL ELECTROCARDIOGRAM 06/06/2009   IDDM 04/09/2009   Asthma not dependent on systemic steroids without complication 04/09/2009   GERD without esophagitis 04/09/2009   Chronic fatigue syndrome 04/09/2009   Home Medication(s) Prior to Admission medications   Medication Sig Start Date End Date Taking? Authorizing Provider  azithromycin  (ZITHROMAX  Z-PAK) 250 MG tablet Take 1 tablet (250 mg total) by mouth daily for 4 days. 07/07/24 07/11/24 Yes Karanvir Balderston, Raynell Moder, MD  metroNIDAZOLE  (FLAGYL ) 500 MG tablet Take 1 tablet (  500 mg total) by mouth 2 (two) times daily. 07/06/24  Yes Wednesday Ericsson, Raynell Moder, MD  albuterol  (VENTOLIN  HFA) 108 (90 Base) MCG/ACT inhaler INHALE 2 PUFFS EVERY 6 HOURS AS NEEDED FOR SHORTNESS OF BREATH OR WHEEZING . NEED OFFICE VISIT FOR FURTHER REFILLS. 12/31/23   Neysa Rama D, MD  amLODipine (NORVASC) 2.5 MG tablet Take 2.5 mg by mouth daily.    [provider]  atorvastatin  (LIPITOR ) 80 MG tablet Take 80 mg by mouth daily. 10/03/20   [provider]  Blood Glucose Monitoring Suppl (TRUE METRIX METER) w/Device KIT 1 each. 11/30/20   [provider]  clopidogrel  (PLAVIX ) 75 MG tablet TAKE 1 TABLET (75 MG TOTAL) BY MOUTH DAILY. 02/11/21 02/12/24  Rosemarie Eather RAMAN, MD  diltiazem  (CARDIZEM  CD) 240 MG 24 hr capsule TAKE 1 CAPSULE BY MOUTH EVERY MORNING ON AN EMPTY STOMACH - [name] DENIED, FAXED DR DELFORD 10/22/15 SS 10/22/15   Delford Maude BROCKS, MD  DROPLET PEN NEEDLES 32G X 4 MM MISC 100 each.    [provider]  ezetimibe (ZETIA) 10 MG tablet Take 10 mg by mouth daily.    [provider]  FLUoxetine  (PROZAC ) 40 MG capsule Take 80 mg by  mouth at bedtime.  12/30/18   [provider]  HUMULIN  R U-500 KWIKPEN 500 UNIT/ML kwikpen Inject 100 Units into the skin daily.  02/26/16   [provider]  hyoscyamine  (LEVSIN ) 0.125 MG tablet TAKE 1 TABLET EVERY 6 HOURS AS NEEDED FOR CRAMPING. 06/08/24   Craig Alan SAUNDERS, PA-C  ibuprofen  (ADVIL ,MOTRIN ) 800 MG tablet Take 800 mg by mouth every 8 (eight) hours as needed (pain).     [provider]  Insulin  Pen Needle (DROPLET PEN NEEDLES) 32G X 4 MM MISC 100 each.    [provider]  ipratropium-albuterol  (DUONEB) 0.5-2.5 (3) MG/3ML SOLN Take 3 mLs by nebulization every 6 (six) hours as needed. Take 3mL by nebulization every 4-6 hours if needed 09/29/23   Neysa Rama D, MD  levothyroxine  (SYNTHROID , LEVOTHROID) 75 MCG tablet Take 75 mcg by mouth daily before breakfast.    [provider]  linaclotide  (LINZESS ) 290 MCG CAPS capsule Take 1 capsule (290 mcg total) by mouth daily before breakfast. 02/12/24 02/06/25  Craig Alan SAUNDERS, PA-C  losartan  (COZAAR ) 25 MG tablet Take 25 mg by mouth daily. 02/24/20   [provider]  metoCLOPramide  (REGLAN ) 10 MG tablet TAKE 1 TABLET EVERY 8 HOURS AS NEEDED FOR NAUSEA 06/18/22   Craig Alan SAUNDERS, PA-C  nebivolol  (BYSTOLIC ) 10 MG tablet Take 1 tablet (10 mg total) by mouth daily. Please call [phone] to schedule an overdue appointment for future refills. Thank you. 3rd final attempt. 04/08/22   Nishan, Peter C, MD  nystatin -triamcinolone  (MYCOLOG II) cream Apply 1 application topically 2 (two) times daily as needed (yeast in skin folds).  02/25/19   [provider]  pantoprazole  (PROTONIX ) 40 MG tablet Take 1 tablet (40 mg total) by mouth 2 (two) times daily. 09/15/22   Aneita Gwendlyn DASEN, MD  pentosan polysulfate (ELMIRON) 100 MG capsule Take 100 mg by mouth 3 (three) times daily.    [provider]  promethazine  (PHENERGAN ) 25 MG tablet TAKE 1 TABLET TWICE DAILY AS NEEDED FOR NAUSEA 05/29/22    Craig Alan SAUNDERS, PA-C  Respiratory Therapy Supplies (NEBULIZER MASK ADULT/TUBING) MISC 1 each by Does not apply route as directed. 09/29/23   Neysa Rama D, MD  rizatriptan (MAXALT) 10 MG tablet Take 10 mg by  mouth daily. 03/30/21   [provider]  tiZANidine  (ZANAFLEX ) 4 MG tablet Take 4 mg by mouth 3 (three) times daily. May hold one dose if needed to operate motor vehicle. 12/30/18   [provider]  TRELEGY ELLIPTA  100-62.5-25 MCG/ACT AEPB INHALE 1 PUFF INTO THE LUNGS DAILY 07/23/23   Neysa Reggy BIRCH, MD  TRUE METRIX BLOOD GLUCOSE TEST test strip 1 each by Other route as needed. 01/11/24   [provider]  TRUEplus Lancets 33G MISC 100 each.    [provider]  valACYclovir (VALTREX) 1000 MG tablet Take 1,000 mg by mouth daily. 09/14/23   [provider]                                                                                                                                    Allergies Influenza vaccines, Iodinated contrast media, Iodine-131, Iohexol , Levofloxacin, Nucynta [tapentadol hydrochloride], Pregabalin, Sulfonamide derivatives, Vantin  [cefpodoxime ], Vilazodone hcl, Amoxicillin , Biaxin [clarithromycin], Carbamazepine, Ciprofloxacin, Dilaudid  [hydromorphone  hcl], Iodides, and Percocet [oxycodone -acetaminophen ]  Review of Systems Review of Systems As noted in HPI  Physical Exam Vital Signs  I have reviewed the triage vital signs BP (!) 165/65   Pulse 86   Temp (!) 97.2 F (36.2 C) (Temporal)   Resp 16   SpO2 96%   Physical Exam Vitals reviewed.  Constitutional:      General: She is not in acute distress.    Appearance: She is well-developed. She is not diaphoretic.  HENT:     Head: Normocephalic and atraumatic.      Right Ear: External ear normal.     Left Ear: External ear normal.     Nose: Nose normal.     Mouth/Throat:     Comments: S/p dental extraction of lower teeth. Left peridontal swelling and tenderness. No  erythema. Eyes:     General: No scleral icterus.    Conjunctiva/sclera: Conjunctivae normal.  Neck:     Trachea: Phonation normal.  Cardiovascular:     Rate and Rhythm: Normal rate and regular rhythm.  Pulmonary:     Effort: Pulmonary effort is normal. No respiratory distress.     Breath sounds: No stridor.  Abdominal:     General: There is no distension.  Musculoskeletal:        General: Normal range of motion.     Cervical back: Normal range of motion.  Neurological:     Mental Status: She is alert and oriented to person, place, and time.  Psychiatric:        Behavior: Behavior normal.     ED Results and Treatments Labs (all labs ordered are listed, but only abnormal results are displayed) Labs Reviewed  COMPREHENSIVE METABOLIC PANEL WITH GFR - Abnormal; Notable for the following components:      Result Value   Sodium 134 (*)    Potassium 3.4 (*)    CO2 14 (*)    Glucose,  Bld 346 (*)    BUN 22 (*)    Creatinine, Ser 1.26 (*)    AST 14 (*)    GFR, Estimated 50 (*)    All other components within normal limits  CBC WITH DIFFERENTIAL/PLATELET - Abnormal; Notable for the following components:   WBC 13.8 (*)    Platelets 424 (*)    Neutro Abs 9.0 (*)    Abs Immature Granulocytes 0.10 (*)    All other components within normal limits  URINALYSIS, W/ REFLEX TO CULTURE (INFECTION SUSPECTED) - Abnormal; Notable for the following components:   APPearance CLOUDY (*)    Glucose, UA >=500 (*)    Hgb urine dipstick MODERATE (*)    Protein, ur 100 (*)    Leukocytes,Ua LARGE (*)    Bacteria, UA RARE (*)    Non Squamous Epithelial 0-5 (*)    All other components within normal limits  CBG MONITORING, ED - Abnormal; Notable for the following components:   Glucose-Capillary 342 (*)    All other components within normal limits  CBG MONITORING, ED - Abnormal; Notable for the following components:   Glucose-Capillary 264 (*)    All other components within normal limits  CBG  MONITORING, ED - Abnormal; Notable for the following components:   Glucose-Capillary 222 (*)    All other components within normal limits  CBG MONITORING, ED - Abnormal; Notable for the following components:   Glucose-Capillary 234 (*)    All other components within normal limits  CBG MONITORING, ED - Abnormal; Notable for the following components:   Glucose-Capillary 252 (*)    All other components within normal limits  CBG MONITORING, ED - Abnormal; Notable for the following components:   Glucose-Capillary 246 (*)    All other components within normal limits  CBG MONITORING, ED - Abnormal; Notable for the following components:   Glucose-Capillary 277 (*)    All other components within normal limits  URINE CULTURE  I-STAT CG4 LACTIC ACID, ED  I-STAT CG4 LACTIC ACID, ED                                                                                                                         EKG  EKG Interpretation Date/Time:    Ventricular Rate:    PR Interval:    QRS Duration:    QT Interval:    QTC Calculation:   R Axis:      Text Interpretation:         Radiology CT Soft Tissue Neck W Contrast Result Date: 07/06/2024 EXAM: CT NECK WITH CONTRAST 07/06/2024 05:22:36 AM TECHNIQUE: CT of the neck was performed with the administration of 75 mL of iohexol  (OMNIPAQUE ) 350 MG/ML injection. Multiplanar reformatted images are provided for review. Automated exposure control, iterative reconstruction, and/or weight based adjustment of the mA/kV was utilized to reduce the radiation dose to as low as reasonably achievable. COMPARISON: None available. CLINICAL HISTORY: Soft tissue infection suspected, neck, xray done. FINDINGS: AERODIGESTIVE TRACT:  No discrete mass. No edema. SALIVARY GLANDS: The parotid and submandibular glands are unremarkable. THYROID : Unremarkable. LYMPH NODES: There are a few shotty cervical lymph nodes. SOFT TISSUES: Just lateral to the lucent defect in the left posterior  mandible, there is an essentially cavitated soft tissue lesion measuring approximately 15 x 13 x 21 mm, likely representing a small abscess. BRAIN, ORBITS, SINUSES AND MASTOIDS: The patient is status post right sinonasal surgery with an antral window. There is a left concha bullosa. LUNGS AND MEDIASTINUM: No acute abnormality. BONES: There is a lucent defect containing a pocket of air within the posterior body of the mandible on the left, compatible with a recently extracted molar. Just lateral to this, there is an essentially cavitated soft tissue lesion measuring approximately 15 x 13 x 21 mm, likely representing a small abscess. There is also a lucent lesion within the posterior body of the mandible on the right, with a stable appearance. There are no obvious inflammatory changes on the right. The patient is status post ACDF at C5-C6 with completed fusion. IMPRESSION: 1. Small cavitated soft tissue abscess in the left posterior body of the mandible adjacent to a recently extracted molar, measuring approximately 15 x 13 x 21 mm; correlate clinically and consider dental/oral maxillofacial surgical evaluation for drainage as indicated 2. No obvious inflammatory changes on the right, with a stable lucent lesion in the right posterior mandible without adjacent inflammatory changes Electronically signed by: Evalene Coho MD 07/06/2024 05:36 AM EST RP Workstation: [id number]   CT Soft Tissue Neck Wo Contrast Result Date: 07/05/2024 EXAM: CT NECK WITHOUT CONTRAST 07/05/2024 04:49:37 PM TECHNIQUE: CT of the neck was performed without the administration of intravenous contrast. Multiplanar reformatted images are provided for review. Automated exposure control, iterative reconstruction, and/or weight based adjustment of the mA/kV was utilized to reduce the radiation dose to as low as reasonably achievable. COMPARISON: None available. CLINICAL HISTORY: jaw pain FINDINGS: AERODIGESTIVE TRACT: No evidence of mass along  the visualized aerodigestive structures in the neck. No edema. SALIVARY GLANDS: The parotid and submandibular glands are unremarkable. THYROID : Unremarkable. LYMPH NODES: There is a mildly prominent left level 1b cervical node which is likely reactive. No enlarged right sided cervical lymph nodes. SOFT TISSUES: There is soft tissue swelling at the left buccal space with locules of gas appreciated. There is no focal fluid collection identified. Evaluation for abscess is somewhat limited given lack of intravenous contrast. There is stranding within the subcutaneous fat overlying the left mandible concerning for cellulitis. Stranding extends into the left anterior neck. There is asymmetric thickening of the left platysma. BRAIN, ORBITS, SINUSES AND MASTOIDS: Mucosal thickening in the maxillary sinuses, left greater than right. Postsurgical changes of the right maxillary sinus. No acute abnormality. LUNGS AND MEDIASTINUM: Azygos lobe visualized. Lung apices are otherwise unremarkable. BONES: Multiple dental extraction sites along the mandible noted bilaterally. Edentulous maxilla. Anterior cervical fusion hardware at C5-C6. Mild degenerative changes in the visualized spine. No focal bone abnormality. IMPRESSION: 1. Soft tissue swelling at the left buccal space with locules of gas, concerning for cellulitis. No focal fluid collection identified; assessment for abscess is limited without IV contrast. 2. Stranding within the subcutaneous fat overlying the left mandible extending into the left anterior neck, concerning for cellulitis. 3. Mildly prominent left level 1b cervical node, likely reactive. No enlarged right-sided cervical lymph nodes. Electronically signed by: Donnice Mania MD 07/05/2024 05:47 PM EST RP Workstation: [id number]    Medications Ordered in ED Medications  methylPREDNISolone  sodium succinate (  SOLU-MEDROL ) 40 mg/mL injection 40 mg (40 mg Intravenous Given 07/06/24 0114)  diphenhydrAMINE  (BENADRYL )  capsule 50 mg (50 mg Oral Given 07/06/24 0415)    Or  diphenhydrAMINE  (BENADRYL ) injection 50 mg ( Intravenous See Alternative 07/06/24 0415)  sodium chloride  0.9 % bolus 1,000 mL (0 mLs Intravenous Stopped 07/06/24 0205)  insulin  aspart (novoLOG ) injection 8 Units (8 Units Subcutaneous Given 07/06/24 0114)  iohexol  (OMNIPAQUE ) 350 MG/ML injection 75 mL (75 mLs Intravenous Contrast Given 07/06/24 0523)  azithromycin  (ZITHROMAX ) tablet 500 mg (500 mg Oral Given 07/06/24 0612)  metroNIDAZOLE  (FLAGYL ) tablet 500 mg (500 mg Oral Given 07/06/24 0612)  acetaminophen  (TYLENOL ) tablet 1,000 mg (1,000 mg Oral Given 07/06/24 0612)   Procedures Procedures  (including critical care time)   This chart was dictated using voice recognition software.  Despite best efforts to proofread,  errors can occur which can change the documentation meaning.   Trine Raynell Moder, MD 07/06/24 [phone]

## 2024-07-07 LAB — URINE CULTURE: Culture: 40000 — AB

## 2024-07-08 ENCOUNTER — Emergency Department (HOSPITAL_BASED_OUTPATIENT_CLINIC_OR_DEPARTMENT_OTHER)

## 2024-07-08 ENCOUNTER — Telehealth (HOSPITAL_BASED_OUTPATIENT_CLINIC_OR_DEPARTMENT_OTHER): Payer: Self-pay | Admitting: *Deleted

## 2024-07-08 ENCOUNTER — Emergency Department (HOSPITAL_COMMUNITY)
Admission: EM | Admit: 2024-07-08 | Discharge: 2024-07-08 | Discharge status: Against Medical Advice | Source: Ambulatory Visit | Attending: Emergency Medicine | Admitting: Emergency Medicine

## 2024-07-08 ENCOUNTER — Encounter (HOSPITAL_COMMUNITY): Payer: Self-pay

## 2024-07-08 ENCOUNTER — Inpatient Hospital Stay (HOSPITAL_BASED_OUTPATIENT_CLINIC_OR_DEPARTMENT_OTHER)
Admission: EM | Admit: 2024-07-08 | Discharge: 2024-07-12 | DRG: 639 | Disposition: A | Source: Ambulatory Visit | Attending: Internal Medicine | Admitting: Internal Medicine

## 2024-07-08 ENCOUNTER — Other Ambulatory Visit: Payer: Self-pay

## 2024-07-08 DIAGNOSIS — R739 Hyperglycemia, unspecified: Secondary | ICD-10-CM | POA: Insufficient documentation

## 2024-07-08 DIAGNOSIS — Z5321 Procedure and treatment not carried out due to patient leaving prior to being seen by health care provider: Secondary | ICD-10-CM | POA: Insufficient documentation

## 2024-07-08 LAB — BETA-HYDROXYBUTYRIC ACID: Beta-Hydroxybutyric Acid: 0.1 mmol/L (ref 0.05–0.27)

## 2024-07-08 LAB — I-STAT VENOUS BLOOD GAS, ED
Acid-base deficit: 15 mmol/L — ABNORMAL HIGH (ref 0.0–2.0)
Acid-base deficit: 12 mmol/L — ABNORMAL HIGH (ref 0.0–2.0)
Acid-base deficit: 13 mmol/L — ABNORMAL HIGH (ref 0.0–2.0)
Bicarbonate: 11.5 mmol/L — ABNORMAL LOW (ref 20.0–28.0)
Bicarbonate: 14.7 mmol/L — ABNORMAL LOW (ref 20.0–28.0)
Bicarbonate: 13.5 mmol/L — ABNORMAL LOW (ref 20.0–28.0)
Calcium, Ion: 1.34 mmol/L (ref 1.15–1.40)
Calcium, Ion: 1.35 mmol/L (ref 1.15–1.40)
Calcium, Ion: 1.28 mmol/L (ref 1.15–1.40)
HCT: 35 % — ABNORMAL LOW (ref 36.0–46.0)
HCT: 33 % — ABNORMAL LOW (ref 36.0–46.0)
HCT: 29 % — ABNORMAL LOW (ref 36.0–46.0)
Hemoglobin: 11.9 g/dL — ABNORMAL LOW (ref 12.0–15.0)
Hemoglobin: 11.2 g/dL — ABNORMAL LOW (ref 12.0–15.0)
Hemoglobin: 9.9 g/dL — ABNORMAL LOW (ref 12.0–15.0)
O2 Saturation: 63 %
O2 Saturation: 35 %
O2 Saturation: 44 %
Patient temperature: 98.1
Patient temperature: 98.1
Potassium: 3 mmol/L — ABNORMAL LOW (ref 3.5–5.1)
Potassium: 3.2 mmol/L — ABNORMAL LOW (ref 3.5–5.1)
Potassium: 2.9 mmol/L — ABNORMAL LOW (ref 3.5–5.1)
Sodium: 138 mmol/L (ref 135–145)
Sodium: 138 mmol/L (ref 135–145)
Sodium: 141 mmol/L (ref 135–145)
TCO2: 12 mmol/L — ABNORMAL LOW (ref 22–32)
TCO2: 16 mmol/L — ABNORMAL LOW (ref 22–32)
TCO2: 14 mmol/L — ABNORMAL LOW (ref 22–32)
pCO2, Ven: 27.2 mmHg — ABNORMAL LOW (ref 44–60)
pCO2, Ven: 33 mmHg — ABNORMAL LOW (ref 44–60)
pCO2, Ven: 30.5 mmHg — ABNORMAL LOW (ref 44–60)
pH, Ven: 7.232 — ABNORMAL LOW (ref 7.25–7.43)
pH, Ven: 7.254 (ref 7.25–7.43)
pH, Ven: 7.253 (ref 7.25–7.43)
pO2, Ven: 38 mmHg (ref 32–45)
pO2, Ven: 23 mmHg — CL (ref 32–45)
pO2, Ven: 28 mmHg — CL (ref 32–45)

## 2024-07-08 LAB — CBC WITH DIFFERENTIAL/PLATELET
Abs Immature Granulocytes: 0.07 K/uL (ref 0.00–0.07)
Abs Immature Granulocytes: 0.08 K/uL — ABNORMAL HIGH (ref 0.00–0.07)
Basophils Absolute: 0.1 K/uL (ref 0.0–0.1)
Basophils Absolute: 0.1 K/uL (ref 0.0–0.1)
Basophils Relative: 1 %
Basophils Relative: 1 %
Eosinophils Absolute: 0.3 K/uL (ref 0.0–0.5)
Eosinophils Absolute: 0.3 K/uL (ref 0.0–0.5)
Eosinophils Relative: 3 %
Eosinophils Relative: 3 %
HCT: 34.4 % — ABNORMAL LOW (ref 36.0–46.0)
HCT: 32.9 % — ABNORMAL LOW (ref 36.0–46.0)
Hemoglobin: 12 g/dL (ref 12.0–15.0)
Hemoglobin: 11.7 g/dL — ABNORMAL LOW (ref 12.0–15.0)
Immature Granulocytes: 1 %
Immature Granulocytes: 1 %
Lymphocytes Relative: 19 %
Lymphocytes Relative: 29 %
Lymphs Abs: 2.2 K/uL (ref 0.7–4.0)
Lymphs Abs: 2.8 K/uL (ref 0.7–4.0)
MCH: 29.6 pg (ref 26.0–34.0)
MCH: 29.6 pg (ref 26.0–34.0)
MCHC: 34.9 g/dL (ref 30.0–36.0)
MCHC: 35.6 g/dL (ref 30.0–36.0)
MCV: 84.9 fL (ref 80.0–100.0)
MCV: 83.3 fL (ref 80.0–100.0)
Monocytes Absolute: 0.8 K/uL (ref 0.1–1.0)
Monocytes Absolute: 0.6 K/uL (ref 0.1–1.0)
Monocytes Relative: 7 %
Monocytes Relative: 6 %
Neutro Abs: 7.8 K/uL — ABNORMAL HIGH (ref 1.7–7.7)
Neutro Abs: 5.7 K/uL (ref 1.7–7.7)
Neutrophils Relative %: 69 %
Neutrophils Relative %: 60 %
Platelets: 380 K/uL (ref 150–400)
Platelets: 313 K/uL (ref 150–400)
RBC: 4.05 MIL/uL (ref 3.87–5.11)
RBC: 3.95 MIL/uL (ref 3.87–5.11)
RDW: 13.4 % (ref 11.5–15.5)
RDW: 13.5 % (ref 11.5–15.5)
WBC: 11.3 K/uL — ABNORMAL HIGH (ref 4.0–10.5)
WBC: 9.7 K/uL (ref 4.0–10.5)
nRBC: 0 % (ref 0.0–0.2)
nRBC: 0 % (ref 0.0–0.2)

## 2024-07-08 LAB — COMPREHENSIVE METABOLIC PANEL WITH GFR
ALT: 23 U/L (ref 0–44)
ALT: 22 U/L (ref 0–44)
AST: 26 U/L (ref 15–41)
AST: 22 U/L (ref 15–41)
Albumin: 3.9 g/dL (ref 3.5–5.0)
Albumin: 4.3 g/dL (ref 3.5–5.0)
Alkaline Phosphatase: 64 U/L (ref 38–126)
Alkaline Phosphatase: 82 U/L (ref 38–126)
Anion gap: 10 (ref 5–15)
Anion gap: 17 — ABNORMAL HIGH (ref 5–15)
BUN: 19 mg/dL (ref 6–20)
BUN: 19 mg/dL (ref 6–20)
CO2: 14 mmol/L — ABNORMAL LOW (ref 22–32)
CO2: 14 mmol/L — ABNORMAL LOW (ref 22–32)
Calcium: 9 mg/dL (ref 8.9–10.3)
Calcium: 9.8 mg/dL (ref 8.9–10.3)
Chloride: 110 mmol/L (ref 98–111)
Chloride: 105 mmol/L (ref 98–111)
Creatinine, Ser: 1.01 mg/dL — ABNORMAL HIGH (ref 0.44–1.00)
Creatinine, Ser: 0.96 mg/dL (ref 0.44–1.00)
GFR, Estimated: >60 mL/min (ref >60)
GFR, Estimated: >60 mL/min (ref >60)
Glucose, Bld: 380 mg/dL — ABNORMAL HIGH (ref 70–99)
Glucose, Bld: 468 mg/dL — ABNORMAL HIGH (ref 70–99)
Potassium: 2.9 mmol/L — ABNORMAL LOW (ref 3.5–5.1)
Potassium: 3.4 mmol/L — ABNORMAL LOW (ref 3.5–5.1)
Sodium: 134 mmol/L — ABNORMAL LOW (ref 135–145)
Sodium: 136 mmol/L (ref 135–145)
Total Bilirubin: 0.4 mg/dL (ref 0.0–1.2)
Total Bilirubin: 0.3 mg/dL (ref 0.0–1.2)
Total Protein: 7.2 g/dL (ref 6.5–8.1)
Total Protein: 7.3 g/dL (ref 6.5–8.1)

## 2024-07-08 LAB — BASIC METABOLIC PANEL WITH GFR
Anion gap: 14 (ref 5–15)
BUN: 12 mg/dL (ref 6–20)
CO2: 14 mmol/L — ABNORMAL LOW (ref 22–32)
Calcium: 9.1 mg/dL (ref 8.9–10.3)
Chloride: 113 mmol/L — ABNORMAL HIGH (ref 98–111)
Creatinine, Ser: 0.66 mg/dL (ref 0.44–1.00)
GFR, Estimated: >60 mL/min (ref >60)
Glucose, Bld: 175 mg/dL — ABNORMAL HIGH (ref 70–99)
Potassium: 3.3 mmol/L — ABNORMAL LOW (ref 3.5–5.1)
Sodium: 141 mmol/L (ref 135–145)

## 2024-07-08 LAB — URINALYSIS, ROUTINE W REFLEX MICROSCOPIC
Bacteria, UA: NONE SEEN
Bilirubin Urine: NEGATIVE
Glucose, UA: >1000 mg/dL — AB
Ketones, ur: NEGATIVE mg/dL
Nitrite: NEGATIVE
Specific Gravity, Urine: 1.02 (ref 1.005–1.030)
pH: 6.5 (ref 5.0–8.0)

## 2024-07-08 LAB — CBG MONITORING, ED
Glucose-Capillary: 334 mg/dL — ABNORMAL HIGH (ref 70–99)
Glucose-Capillary: 427 mg/dL — ABNORMAL HIGH (ref 70–99)
Glucose-Capillary: 302 mg/dL — ABNORMAL HIGH (ref 70–99)
Glucose-Capillary: 285 mg/dL — ABNORMAL HIGH (ref 70–99)
Glucose-Capillary: 201 mg/dL — ABNORMAL HIGH (ref 70–99)
Glucose-Capillary: 173 mg/dL — ABNORMAL HIGH (ref 70–99)
Glucose-Capillary: 158 mg/dL — ABNORMAL HIGH (ref 70–99)

## 2024-07-08 MED ORDER — INSULIN ASPART 100 UNIT/ML IJ SOLN
10.0000 [IU] | Freq: Once | INTRAMUSCULAR | Status: AC
  Administered 2024-07-08: 10 [IU] via INTRAVENOUS
  Filled 2024-07-08: qty 10

## 2024-07-08 MED ORDER — POTASSIUM CHLORIDE CRYS ER 20 MEQ PO TBCR
20.0000 meq | EXTENDED_RELEASE_TABLET | Freq: Once | ORAL | Status: AC
  Administered 2024-07-08: 20 meq via ORAL
  Filled 2024-07-08: qty 1

## 2024-07-08 MED ORDER — POTASSIUM CHLORIDE 10 MEQ/100ML IV SOLN
10.0000 meq | INTRAVENOUS | Status: AC
  Administered 2024-07-08 (×4): 10 meq via INTRAVENOUS
  Filled 2024-07-08 (×4): qty 100

## 2024-07-08 MED ORDER — LACTATED RINGERS IV SOLN: INTRAVENOUS | Status: AC

## 2024-07-08 MED ORDER — SODIUM CHLORIDE 0.9 % IV BOLUS
1000.0000 mL | Freq: Once | INTRAVENOUS | Status: AC
  Administered 2024-07-08: 1000 mL via INTRAVENOUS

## 2024-07-08 MED ORDER — DEXTROSE 50 % IV SOLN: 0.0000 mL | INTRAVENOUS | Status: DC | PRN

## 2024-07-08 MED ORDER — METRONIDAZOLE 500 MG PO TABS
500.0000 mg | ORAL_TABLET | Freq: Once | ORAL | Status: AC
  Administered 2024-07-08: 500 mg via ORAL
  Filled 2024-07-08: qty 1

## 2024-07-08 MED ORDER — DEXTROSE IN LACTATED RINGERS 5 % IV SOLN: INTRAVENOUS | Status: AC

## 2024-07-08 MED ORDER — ACETAMINOPHEN 500 MG PO TABS
1000.0000 mg | ORAL_TABLET | Freq: Once | ORAL | Status: AC
  Administered 2024-07-08: 1000 mg via ORAL
  Filled 2024-07-08: qty 2

## 2024-07-08 MED ORDER — LACTATED RINGERS IV BOLUS
20.0000 mL/kg | Freq: Once | INTRAVENOUS | Status: AC
  Administered 2024-07-08: 1000 mL via INTRAVENOUS

## 2024-07-08 MED ORDER — INSULIN REGULAR HUMAN 100 UNIT/ML IJ SOLN: 10.0000 [IU] | Freq: Once | INTRAMUSCULAR | Status: DC

## 2024-07-08 MED ORDER — INSULIN REGULAR(HUMAN) IN NACL 100-0.9 UT/100ML-% IV SOLN
INTRAVENOUS | Status: DC
  Administered 2024-07-08: 13 [IU]/h via INTRAVENOUS
  Filled 2024-07-08 (×2): qty 100

## 2024-07-08 NOTE — ED Provider Triage Note (Signed)
 Emergency Medicine Provider Triage Evaluation Note  Cheryl [name] , a 58 y.o. female  was evaluated in triage.  Pt complains of uncontrolled blood sugar, recently seen in the emergency department.  Had imaging including a CT scan of the neck with contrast, this showed that the patient had a abscess in the left mandible, no other signs of Ludwig's angina, he had already had an incision and drainage but was still having some discomfort.  She was discharged on Zithromax  and Flagyl , pain is still present but better, swelling is minimal, blood sugars have been in the 400 range, she was brought here by family member after family doctor referred them in due to hyperglycemia.  She was also been seen by her oral surgeon and they recommended blood sugar control  Review of Systems  Positive: Hyperglycemia, toothache Negative: Fevers, vomiting, diarrhea  Physical Exam  BP 123/61   Pulse 62   Temp 98.2 F (36.8 C)   Resp 17   SpO2 99%  Gen:   Awake, no distress   Resp:  Normal effort  MSK:   Moves extremities without difficulty  Other:  Mouth able to open and close, no trismus or torticollis, mild swelling and tenderness over the left lateral jaw, no intraoral abscesses palpated, no areas of fluctuance, no lymphadenopathy the neck, very supple neck, no tenderness under the jaw, able to push tongue out of the mouth without difficulty, phonation is normal  Medical Decision Making  Medically screening exam initiated at 11:50 AM.  Appropriate orders placed.  Cheryl [name] was informed that the remainder of the evaluation will be completed by another provider, this initial triage assessment does not replace that evaluation, and the importance of remaining in the ED until their evaluation is complete.  Primarily hyperglycemic visit, she is already being treated for the dental infection, taking the antibiotics, seems to be gradually improving but her blood sugars are out of control.  Make sure she  is not in DKA, optimize medications, she is already being seen by endocrinology but is not seen in a couple of months, currently taking 190 units of insulin .  Family member at the bedside is an additional historian.   Cleotilde Rogue, MD 07/08/24 [phone]

## 2024-07-08 NOTE — ED Triage Notes (Signed)
 Pt had recent oral surgery, pt sent by PCP and oral surgeon due to hyperglycemia. Cbg in the 400s today.

## 2024-07-08 NOTE — ED Triage Notes (Signed)
 Pt POV reporting hyperglycemia past few days, being treated for oral abscess, sent by PCP due to CBG 400s, seen in other ED earlier today, blood work obtained, left due to wait times.

## 2024-07-08 NOTE — Progress Notes (Signed)
 Plan of Care Note for accepted transfer   Patient: Cheryl [name] MRN: [id number]   DOA: 07/08/2024  Facility requesting transfer: [facility] Requesting Provider: [facility] Reason for transfer: Treatment of DKA Facility course:   Patient with recent tooth extraction complicated by uncontrolled Diabetes Mellitus. Patient presented to [facility] with frank DKA with multiple electrolyte abnormalities including hypokalemia. Discussed with ED provider. Potassium to be replaced and patient to be initiated on Insulin  gtt ASAP  Plan of care: The patient is accepted for admission to Progressive unit, at [facility]    Author: Maude MARLA Dart, MD 07/08/2024  Check [email or web address] for on-call coverage.  Nursing staff, Please call [facility] Admits & Consults System-Wide number on Amion as soon as patient's arrival, so appropriate admitting provider can evaluate the pt.

## 2024-07-08 NOTE — Progress Notes (Signed)
 ED Antimicrobial Stewardship Positive Culture Follow Up   Cheryl [name] is an 58 y.o. female who presented to [facility] on 07/05/2024 with a chief complaint of  Chief Complaint  Patient presents with   Dental Problem    Recent Results (from the past 720 hours)  Urine Culture     Status: Abnormal   Collection Time: 07/05/24  4:42 PM   Specimen: Urine, Random  Result Value Ref Range Status   Specimen Description URINE, RANDOM  Final   Special Requests   Final    NONE Reflexed from [id number] Performed at [facility], [address] [place], [place] [address]    Culture 40,000 COLONIES/mL ESCHERICHIA COLI (A)  Final   Report Status 07/07/2024 FINAL  Final   Organism ID, Bacteria ESCHERICHIA COLI (A)  Final      Susceptibility   Escherichia coli - MIC*    AMPICILLIN <=2 SENSITIVE Sensitive     CEFAZOLIN  (URINE) Value in next row Sensitive      <=1 SENSITIVEThis is a modified FDA-approved test that has been validated and its performance characteristics determined by the reporting laboratory.  This laboratory is certified under the Clinical Laboratory Improvement Amendments CLIA as qualified to perform high complexity clinical laboratory testing.    CEFEPIME Value in next row Sensitive      <=1 SENSITIVEThis is a modified FDA-approved test that has been validated and its performance characteristics determined by the reporting laboratory.  This laboratory is certified under the Clinical Laboratory Improvement Amendments CLIA as qualified to perform high complexity clinical laboratory testing.    ERTAPENEM Value in next row Sensitive      <=1 SENSITIVEThis is a modified FDA-approved test that has been validated and its performance characteristics determined by the reporting laboratory.  This laboratory is certified under the Clinical Laboratory Improvement Amendments CLIA as qualified to perform high complexity clinical laboratory testing.    CEFTRIAXONE Value in next row Sensitive       <=1 SENSITIVEThis is a modified FDA-approved test that has been validated and its performance characteristics determined by the reporting laboratory.  This laboratory is certified under the Clinical Laboratory Improvement Amendments CLIA as qualified to perform high complexity clinical laboratory testing.    CIPROFLOXACIN Value in next row Sensitive      <=1 SENSITIVEThis is a modified FDA-approved test that has been validated and its performance characteristics determined by the reporting laboratory.  This laboratory is certified under the Clinical Laboratory Improvement Amendments CLIA as qualified to perform high complexity clinical laboratory testing.    GENTAMICIN Value in next row Sensitive      <=1 SENSITIVEThis is a modified FDA-approved test that has been validated and its performance characteristics determined by the reporting laboratory.  This laboratory is certified under the Clinical Laboratory Improvement Amendments CLIA as qualified to perform high complexity clinical laboratory testing.    NITROFURANTOIN Value in next row Sensitive      <=1 SENSITIVEThis is a modified FDA-approved test that has been validated and its performance characteristics determined by the reporting laboratory.  This laboratory is certified under the Clinical Laboratory Improvement Amendments CLIA as qualified to perform high complexity clinical laboratory testing.    TRIMETH/SULFA Value in next row Sensitive      <=1 SENSITIVEThis is a modified FDA-approved test that has been validated and its performance characteristics determined by the reporting laboratory.  This laboratory is certified under the Clinical Laboratory Improvement Amendments CLIA as qualified to perform high  complexity clinical laboratory testing.    AMPICILLIN/SULBACTAM Value in next row Sensitive      <=1 SENSITIVEThis is a modified FDA-approved test that has been validated and its performance characteristics determined by the reporting  laboratory.  This laboratory is certified under the Clinical Laboratory Improvement Amendments CLIA as qualified to perform high complexity clinical laboratory testing.    PIP/TAZO Value in next row Sensitive      <=4 SENSITIVEThis is a modified FDA-approved test that has been validated and its performance characteristics determined by the reporting laboratory.  This laboratory is certified under the Clinical Laboratory Improvement Amendments CLIA as qualified to perform high complexity clinical laboratory testing.    MEROPENEM Value in next row Sensitive      <=4 SENSITIVEThis is a modified FDA-approved test that has been validated and its performance characteristics determined by the reporting laboratory.  This laboratory is certified under the Clinical Laboratory Improvement Amendments CLIA as qualified to perform high complexity clinical laboratory testing.    * 40,000 COLONIES/mL ESCHERICHIA COLI    Pt treated for pain post dental extraction. No urinary symptoms. No treatment for UTI.  ED Provider: Sherlean Carota, PA-C   Vito Ralph, PharmD, BCPS 07/08/2024, 8:27 AM Clinical Pharmacist Monday - Friday phone -  [phone] Saturday - Sunday phone - [phone]

## 2024-07-08 NOTE — Telephone Encounter (Signed)
 Post ED Visit - Positive Culture Follow-up  Culture report reviewed by antimicrobial stewardship pharmacist: [facility] [x]  Vito Ralph, Pharm.D. []  Venetia Gully, Pharm.D., BCPS AQ-ID []  Garrel Crews, Pharm.D., BCPS []  Almarie Lunger, Pharm.D., BCPS []  [place], [address].D., BCPS, AAHIVP []  Rosaline Bihari, Pharm.D., BCPS, AAHIVP []  Vernell Meier, PharmD, BCPS []  Latanya Hint, PharmD, BCPS []  Donald Medley, PharmD, BCPS []  Rocky Bold, PharmD []  Dorothyann Alert, PharmD, BCPS []  Morene Babe, PharmD  [facility] []  Rosaline Edison, PharmD []  Romona Bliss, PharmD []  Dolphus Roller, PharmD []  Veva Seip, Rph []  Vernell Daunt) Leonce, PharmD []  Eva Allis, PharmD []  Rosaline Millet, PharmD []  Iantha Batch, PharmD []  Arvin Gauss, PharmD []  Wanda Hasting, PharmD []  Ronal Rav, PharmD []  Rocky Slade, PharmD []  Bard Jeans, PharmD   Positive urine culture Treated with azithromycin  and metronidazole  for dental abscess; no UTI symptoms.  Do not treat; no further patient follow-up is required at this time.  Cheryl [name] 07/08/2024, 4:09 PM

## 2024-07-08 NOTE — ED Notes (Signed)
 Pt decided to leave AMA due to wait time stated will go to [facility]

## 2024-07-08 NOTE — ED Provider Notes (Signed)
 [place] EMERGENCY DEPARTMENT AT [facility] Provider Note   CSN: [id number] Arrival date & time: 07/08/24  1507     Patient presents with: Hyperglycemia   Cheryl [name] is a 58 y.o. female.   Patient is a 58 year old female with a history of hypertension, asthma, GERD, type 2 diabetes, and recent dental abscess requiring incision and drainage who presents to the ED from PCP for high blood sugars.  Patient notes that approximate 1 week ago she had surgery to remove a tooth loose mouth.  She states 3 days ago she had an abscess drained in the mouth.  She notes at that time she had high blood sugars as well as into the ED.  She was discharged from the emergency room on antibiotics and has been taking these.  She notes that her blood sugar has been high over the past few days from anywhere 200-500 at home.  She states her normal is in the 200s.  She states she takes insulin  twice a day with no recent increase in dosage.  She does not have an emergency dose and is not on any oral medications for her diabetes.  States she received 1 dose of steroids while in the ED couple days ago but has not been taking steroids at home.  States the swelling has been improving and pain is minimally improved.  She has Norco but states she only takes Tylenol  or ibuprofen .  Denies fevers, headache, dizziness, chest pain, shortness of breath, cough/congestion, abdominal pain, nausea/vomit/diarrhea.  No further complaints.     Hyperglycemia Associated symptoms: no abdominal pain, no chest pain, no fever, no nausea, no shortness of breath and no vomiting        Prior to Admission medications  Medication Sig Start Date End Date Taking? Authorizing Provider  albuterol  (VENTOLIN  HFA) 108 (90 Base) MCG/ACT inhaler INHALE 2 PUFFS EVERY 6 HOURS AS NEEDED FOR SHORTNESS OF BREATH OR WHEEZING . NEED OFFICE VISIT FOR FURTHER REFILLS. 12/31/23   Neysa Rama D, MD  amLODipine (NORVASC) 2.5 MG tablet Take 2.5 mg by  mouth daily.    [provider]  atorvastatin  (LIPITOR ) 80 MG tablet Take 80 mg by mouth daily. 10/03/20   [provider]  azithromycin  (ZITHROMAX  Z-PAK) 250 MG tablet Take 1 tablet (250 mg total) by mouth daily for 4 days. 07/07/24 07/11/24  Trine Raynell Moder, MD  Blood Glucose Monitoring Suppl (TRUE METRIX METER) w/Device KIT 1 each. 11/30/20   [provider]  clopidogrel  (PLAVIX ) 75 MG tablet TAKE 1 TABLET (75 MG TOTAL) BY MOUTH DAILY. 02/11/21 02/12/24  Rosemarie Eather RAMAN, MD  diltiazem  (CARDIZEM  CD) 240 MG 24 hr capsule TAKE 1 CAPSULE BY MOUTH EVERY MORNING ON AN EMPTY STOMACH - [name] DENIED, FAXED DR DELFORD 10/22/15 SS 10/22/15   Delford Maude BROCKS, MD  DROPLET PEN NEEDLES 32G X 4 MM MISC 100 each.    [provider]  ezetimibe (ZETIA) 10 MG tablet Take 10 mg by mouth daily.    [provider]  FLUoxetine  (PROZAC ) 40 MG capsule Take 80 mg by mouth at bedtime.  12/30/18   [provider]  HUMULIN  R U-500 KWIKPEN 500 UNIT/ML kwikpen Inject 100 Units into the skin daily.  02/26/16   [provider]  hyoscyamine  (LEVSIN ) 0.125 MG tablet TAKE 1 TABLET EVERY 6 HOURS AS NEEDED FOR CRAMPING. 06/08/24   Craig Palma R, PA-C  ibuprofen  (ADVIL ,MOTRIN ) 800 MG tablet Take 800 mg by mouth every 8 (eight) hours as  needed (pain).     [provider]  Insulin  Pen Needle (DROPLET PEN NEEDLES) 32G X 4 MM MISC 100 each.    [provider]  ipratropium-albuterol  (DUONEB) 0.5-2.5 (3) MG/3ML SOLN Take 3 mLs by nebulization every 6 (six) hours as needed. Take 3mL by nebulization every 4-6 hours if needed 09/29/23   Neysa Reggy BIRCH, MD  levothyroxine  (SYNTHROID , LEVOTHROID) 75 MCG tablet Take 75 mcg by mouth daily before breakfast.    [provider]  linaclotide  (LINZESS ) 290 MCG CAPS capsule Take 1 capsule (290 mcg total) by mouth daily before breakfast. 02/12/24 02/06/25  Craig Alan SAUNDERS, PA-C  losartan  (COZAAR ) 25 MG tablet Take 25  mg by mouth daily. 02/24/20   [provider]  metoCLOPramide  (REGLAN ) 10 MG tablet TAKE 1 TABLET EVERY 8 HOURS AS NEEDED FOR NAUSEA 06/18/22   Collier, Amanda R, PA-C  metroNIDAZOLE  (FLAGYL ) 500 MG tablet Take 1 tablet (500 mg total) by mouth 2 (two) times daily. 07/06/24   CardamaRaynell Moder, MD  nebivolol  (BYSTOLIC ) 10 MG tablet Take 1 tablet (10 mg total) by mouth daily. Please call [phone] to schedule an overdue appointment for future refills. Thank you. 3rd final attempt. 04/08/22   Nishan, Peter C, MD  nystatin -triamcinolone  (MYCOLOG II) cream Apply 1 application topically 2 (two) times daily as needed (yeast in skin folds).  02/25/19   [provider]  pantoprazole  (PROTONIX ) 40 MG tablet Take 1 tablet (40 mg total) by mouth 2 (two) times daily. 09/15/22   Aneita Gwendlyn DASEN, MD  pentosan polysulfate (ELMIRON) 100 MG capsule Take 100 mg by mouth 3 (three) times daily.    [provider]  promethazine  (PHENERGAN ) 25 MG tablet TAKE 1 TABLET TWICE DAILY AS NEEDED FOR NAUSEA 05/29/22   Craig Alan SAUNDERS, PA-C  Respiratory Therapy Supplies (NEBULIZER MASK ADULT/TUBING) MISC 1 each by Does not apply route as directed. 09/29/23   Neysa Reggy D, MD  rizatriptan (MAXALT) 10 MG tablet Take 10 mg by mouth daily. 03/30/21   [provider]  tiZANidine  (ZANAFLEX ) 4 MG tablet Take 4 mg by mouth 3 (three) times daily. May hold one dose if needed to operate motor vehicle. 12/30/18   [provider]  TRELEGY ELLIPTA  100-62.5-25 MCG/ACT AEPB INHALE 1 PUFF INTO THE LUNGS DAILY 07/23/23   Neysa Reggy BIRCH, MD  TRUE METRIX BLOOD GLUCOSE TEST test strip 1 each by Other route as needed. 01/11/24   [provider]  TRUEplus Lancets 33G MISC 100 each.    [provider]  valACYclovir (VALTREX) 1000 MG tablet Take 1,000 mg by mouth daily. 09/14/23   [provider]    Allergies: Influenza vaccines, Iodinated contrast media, Iodine-131, Iohexol ,  Levofloxacin, Nucynta [tapentadol hydrochloride], Pregabalin, Sulfonamide derivatives, Vantin  [cefpodoxime ], Vilazodone hcl, Amoxicillin , Biaxin [clarithromycin], Carbamazepine, Ciprofloxacin, Dilaudid  [hydromorphone  hcl], Iodides, and Percocet [oxycodone -acetaminophen ]    Review of Systems  Constitutional:  Negative for chills and fever.  HENT:  Positive for dental problem.   Respiratory:  Negative for shortness of breath.   Cardiovascular:  Negative for chest pain.  Gastrointestinal:  Negative for abdominal pain, diarrhea, nausea and vomiting.  Neurological:  Negative for headaches.  All other systems reviewed and are negative.   Updated Vital Signs BP (!) 183/90   Pulse 73   Temp 98.1 F (36.7 C) (Oral)   Resp 17   Ht 5' 2 (1.575 m)   Wt 83.5 kg   SpO2 99%   BMI 33.65 kg/m   Physical  Exam Constitutional:      Appearance: Normal appearance.  HENT:     Head: Normocephalic and atraumatic.     Mouth/Throat:     Mouth: Mucous membranes are moist.     Comments: The bottom molar has been removed.  The area appears to be healing well with overlying tissue.  Mild edema noted to the left lower mandible area.  No obvious abscess inside the mouth.  No buccal or facial edema.  No submandibular edema or signs of Ludwig angina.  Posterior oropharynx clear with no respiratory distress Cardiovascular:     Rate and Rhythm: Normal rate.  Pulmonary:     Effort: Pulmonary effort is normal.     Breath sounds: Normal breath sounds.  Skin:    General: Skin is warm and dry.  Neurological:     Mental Status: She is alert and oriented to person, place, and time.  Psychiatric:        Mood and Affect: Mood normal.        Behavior: Behavior normal.     (all labs ordered are listed, but only abnormal results are displayed) Labs Reviewed  COMPREHENSIVE METABOLIC PANEL WITH GFR - Abnormal; Notable for the following components:      Result Value   Potassium 3.4 (*)    CO2 14 (*)    Glucose,  Bld 468 (*)    Anion gap 17 (*)    All other components within normal limits  CBC WITH DIFFERENTIAL/PLATELET - Abnormal; Notable for the following components:   Hemoglobin 11.7 (*)    HCT 32.9 (*)    Abs Immature Granulocytes 0.08 (*)    All other components within normal limits  URINALYSIS, ROUTINE W REFLEX MICROSCOPIC - Abnormal; Notable for the following components:   Glucose, UA >1,000 (*)    Hgb urine dipstick SMALL (*)    Protein, ur TRACE (*)    Leukocytes,Ua SMALL (*)    All other components within normal limits  CBG MONITORING, ED - Abnormal; Notable for the following components:   Glucose-Capillary 427 (*)    All other components within normal limits  I-STAT VENOUS BLOOD GAS, ED - Abnormal; Notable for the following components:   pCO2, Ven 33.0 (*)    pO2, Ven 23 (*)    Bicarbonate 14.7 (*)    TCO2 16 (*)    Acid-base deficit 12.0 (*)    Potassium 3.2 (*)    HCT 33.0 (*)    Hemoglobin 11.2 (*)    All other components within normal limits  CBG MONITORING, ED - Abnormal; Notable for the following components:   Glucose-Capillary 302 (*)    All other components within normal limits  I-STAT VENOUS BLOOD GAS, ED - Abnormal; Notable for the following components:   pCO2, Ven 30.5 (*)    pO2, Ven 28 (*)    Bicarbonate 13.5 (*)    TCO2 14 (*)    Acid-base deficit 13.0 (*)    Potassium 2.9 (*)    HCT 29.0 (*)    Hemoglobin 9.9 (*)    All other components within normal limits  BASIC METABOLIC PANEL WITH GFR    EKG: None  Radiology: DG Chest Portable 1 View Result Date: 07/08/2024 EXAM: 1 VIEW(S) XRAY OF THE CHEST 07/08/2024 04:45:00 PM COMPARISON: 11/04/2022. CLINICAL HISTORY: hyperglycemia FINDINGS: LINES, TUBES AND DEVICES: Left chest atrial loop recorder noted. LUNGS AND PLEURA: No focal pulmonary opacity. No pleural effusion. No pneumothorax. HEART AND MEDIASTINUM: No acute abnormality of the  cardiac and mediastinal silhouettes. BONES AND SOFT TISSUES: Partially  visible lower cervical ACDF hardware noted. No acute osseous abnormality. IMPRESSION: 1. No acute cardiopulmonary process. Electronically signed by: Pinkie Pebbles MD 07/08/2024 05:50 PM EST RP Workstation: [id number]      Medications Ordered in the ED  lactated ringers  bolus 1,670 mL (has no administration in time range)  insulin  regular, human (MYXREDLIN ) 100 units/ 100 mL infusion (has no administration in time range)  lactated ringers  infusion (has no administration in time range)  dextrose  5 % in lactated ringers  infusion (has no administration in time range)  dextrose  50 % solution 0-50 mL (has no administration in time range)  potassium chloride  10 mEq in 100 mL IVPB (has no administration in time range)  sodium chloride  0.9 % bolus 1,000 mL (0 mLs Intravenous Stopped 07/08/24 1655)  potassium chloride  SA (KLOR-CON  M) CR tablet 20 mEq (20 mEq Oral Given 07/08/24 1645)  acetaminophen  (TYLENOL ) tablet 1,000 mg (1,000 mg Oral Given 07/08/24 1644)  sodium chloride  0.9 % bolus 1,000 mL (0 mLs Intravenous Stopped 07/08/24 1800)  insulin  aspart (novoLOG ) injection 10 Units (10 Units Intravenous Given 07/08/24 1646)    Clinical Course as of 07/08/24 1848  Fri Jul 08, 2024  1616 Has follow-up with oral surgeon on 12/18 [AY]  1839 Beta-hydroxybutyrate acid normal at [facility], ED this morning. [AY]  1840 Currently takes 190 units Humulin  daily [AY]    Clinical Course User Index [AY] Neysa Thersia RAMAN, PA-C                                Medical Decision Making Patient is a 58 year old female with a history of hypertension, asthma, GERD, type 2 diabetes, and recent dental abscess requiring incision and drainage 2 days prior who presents to the ED from PCP for uncontrolled blood sugars.  Patient notes her blood sugar has been uncontrolled for a couple weeks since her initial tooth extraction but have been worse over the past few days.  Notes her blood sugars have been in the 500s at home.   She states she has been taking her insulin  as prescribed.  She notes pain and swelling in her mouth is improving after being on antibiotics for 2 days.  Currently taking azithromycin  and Flagyl .  On exam patient is alert and in no acute distress.  Physical exam as noted above.  No acute respiratory distress noted.  Chest x-ray reviewed and negative for acute process.  Labs significant for initial glucose of 468 and potassium of 3.4.  VBG obtained that shows concerns for compensated DKA.  pH is stable but bicarb is 13.5 and pO2 is 28.  CO2 is 30.5.  Patient initially given 2 L of fluids, 20 mill equivalents of potassium orally and 10 units of NovoLog .  Glucose is improving to 302 but repeat VBG does show continued concerns for DKA.  As patient's blood sugar has been uncontrolled for a couple weeks, this does appear consistent with compensated DKA.  Patient is stable and well-appearing.  Endotool has been ordered including IV potassium, insulin  drip, and fluids.  Patient will be started on IV potassium and then insulin  drip.  Hospitalist has been consulted and are agreeable to admit patient for further evaluation.  Patient is stable while in ED and awaiting transfer.   Amount and/or Complexity of Data Reviewed Labs: ordered. Radiology: ordered.  Risk OTC drugs. Prescription drug management. Decision regarding hospitalization.  Final diagnoses:  Diabetic ketoacidosis without coma associated with type 2 diabetes mellitus [facility])    ED Discharge Orders     None          Neysa Thersia RAMAN, PA-C 07/08/24 1848    Ruthe Cornet, DO 07/08/24 1919

## 2024-07-09 DIAGNOSIS — Z79899 Other long term (current) drug therapy: Secondary | ICD-10-CM | POA: Diagnosis not present

## 2024-07-09 DIAGNOSIS — Z91041 Radiographic dye allergy status: Secondary | ICD-10-CM | POA: Diagnosis not present

## 2024-07-09 DIAGNOSIS — F32A Depression, unspecified: Secondary | ICD-10-CM | POA: Diagnosis present

## 2024-07-09 DIAGNOSIS — Z8673 Personal history of transient ischemic attack (TIA), and cerebral infarction without residual deficits: Secondary | ICD-10-CM | POA: Diagnosis not present

## 2024-07-09 DIAGNOSIS — K047 Periapical abscess without sinus: Secondary | ICD-10-CM | POA: Diagnosis present

## 2024-07-09 DIAGNOSIS — M272 Inflammatory conditions of jaws: Secondary | ICD-10-CM | POA: Diagnosis present

## 2024-07-09 DIAGNOSIS — Z85820 Personal history of malignant melanoma of skin: Secondary | ICD-10-CM | POA: Diagnosis not present

## 2024-07-09 DIAGNOSIS — E785 Hyperlipidemia, unspecified: Secondary | ICD-10-CM | POA: Diagnosis present

## 2024-07-09 DIAGNOSIS — E1142 Type 2 diabetes mellitus with diabetic polyneuropathy: Secondary | ICD-10-CM | POA: Diagnosis present

## 2024-07-09 DIAGNOSIS — Z833 Family history of diabetes mellitus: Secondary | ICD-10-CM | POA: Diagnosis not present

## 2024-07-09 DIAGNOSIS — E876 Hypokalemia: Secondary | ICD-10-CM | POA: Diagnosis present

## 2024-07-09 DIAGNOSIS — K219 Gastro-esophageal reflux disease without esophagitis: Secondary | ICD-10-CM | POA: Diagnosis present

## 2024-07-09 DIAGNOSIS — M797 Fibromyalgia: Secondary | ICD-10-CM | POA: Diagnosis present

## 2024-07-09 DIAGNOSIS — Z79890 Hormone replacement therapy (postmenopausal): Secondary | ICD-10-CM | POA: Diagnosis not present

## 2024-07-09 DIAGNOSIS — Z87891 Personal history of nicotine dependence: Secondary | ICD-10-CM | POA: Diagnosis not present

## 2024-07-09 DIAGNOSIS — J4489 Other specified chronic obstructive pulmonary disease: Secondary | ICD-10-CM | POA: Diagnosis present

## 2024-07-09 DIAGNOSIS — E1110 Type 2 diabetes mellitus with ketoacidosis without coma: Secondary | ICD-10-CM | POA: Diagnosis not present

## 2024-07-09 DIAGNOSIS — F419 Anxiety disorder, unspecified: Secondary | ICD-10-CM | POA: Diagnosis present

## 2024-07-09 DIAGNOSIS — E039 Hypothyroidism, unspecified: Secondary | ICD-10-CM | POA: Diagnosis present

## 2024-07-09 DIAGNOSIS — Z8249 Family history of ischemic heart disease and other diseases of the circulatory system: Secondary | ICD-10-CM | POA: Diagnosis not present

## 2024-07-09 DIAGNOSIS — Z794 Long term (current) use of insulin: Secondary | ICD-10-CM | POA: Diagnosis not present

## 2024-07-09 DIAGNOSIS — I10 Essential (primary) hypertension: Secondary | ICD-10-CM | POA: Diagnosis present

## 2024-07-09 DIAGNOSIS — E1143 Type 2 diabetes mellitus with diabetic autonomic (poly)neuropathy: Secondary | ICD-10-CM | POA: Diagnosis present

## 2024-07-09 DIAGNOSIS — Z7902 Long term (current) use of antithrombotics/antiplatelets: Secondary | ICD-10-CM | POA: Diagnosis not present

## 2024-07-09 LAB — BASIC METABOLIC PANEL WITH GFR
Anion gap: 11 (ref 5–15)
Anion gap: 12 (ref 5–15)
Anion gap: 13 (ref 5–15)
Anion gap: 14 (ref 5–15)
Anion gap: 5 (ref 5–15)
BUN: 10 mg/dL (ref 6–20)
BUN: 12 mg/dL (ref 6–20)
BUN: 5 mg/dL — ABNORMAL LOW (ref 6–20)
BUN: 6 mg/dL (ref 6–20)
BUN: 7 mg/dL (ref 6–20)
CO2: 15 mmol/L — ABNORMAL LOW (ref 22–32)
CO2: 15 mmol/L — ABNORMAL LOW (ref 22–32)
CO2: 16 mmol/L — ABNORMAL LOW (ref 22–32)
CO2: 16 mmol/L — ABNORMAL LOW (ref 22–32)
CO2: 17 mmol/L — ABNORMAL LOW (ref 22–32)
Calcium: 7.7 mg/dL — ABNORMAL LOW (ref 8.9–10.3)
Calcium: 8.5 mg/dL — ABNORMAL LOW (ref 8.9–10.3)
Calcium: 9 mg/dL (ref 8.9–10.3)
Calcium: 9.1 mg/dL (ref 8.9–10.3)
Calcium: 9.2 mg/dL (ref 8.9–10.3)
Chloride: 111 mmol/L (ref 98–111)
Chloride: 111 mmol/L (ref 98–111)
Chloride: 111 mmol/L (ref 98–111)
Chloride: 113 mmol/L — ABNORMAL HIGH (ref 98–111)
Chloride: 120 mmol/L — ABNORMAL HIGH (ref 98–111)
Creatinine, Ser: 0.51 mg/dL (ref 0.44–1.00)
Creatinine, Ser: 0.6 mg/dL (ref 0.44–1.00)
Creatinine, Ser: 0.63 mg/dL (ref 0.44–1.00)
Creatinine, Ser: 0.63 mg/dL (ref 0.44–1.00)
Creatinine, Ser: 0.66 mg/dL (ref 0.44–1.00)
GFR, Estimated: >60 mL/min (ref >60)
GFR, Estimated: >60 mL/min (ref >60)
GFR, Estimated: >60 mL/min (ref >60)
GFR, Estimated: >60 mL/min (ref >60)
GFR, Estimated: >60 mL/min (ref >60)
Glucose, Bld: 167 mg/dL — ABNORMAL HIGH (ref 70–99)
Glucose, Bld: 170 mg/dL — ABNORMAL HIGH (ref 70–99)
Glucose, Bld: 171 mg/dL — ABNORMAL HIGH (ref 70–99)
Glucose, Bld: 204 mg/dL — ABNORMAL HIGH (ref 70–99)
Glucose, Bld: 414 mg/dL — ABNORMAL HIGH (ref 70–99)
Potassium: 2.7 mmol/L — CL (ref 3.5–5.1)
Potassium: 2.9 mmol/L — ABNORMAL LOW (ref 3.5–5.1)
Potassium: 3.2 mmol/L — ABNORMAL LOW (ref 3.5–5.1)
Potassium: 3.6 mmol/L (ref 3.5–5.1)
Potassium: 4.1 mmol/L (ref 3.5–5.1)
Sodium: 138 mmol/L (ref 135–145)
Sodium: 140 mmol/L (ref 135–145)
Sodium: 140 mmol/L (ref 135–145)
Sodium: 140 mmol/L (ref 135–145)
Sodium: 142 mmol/L (ref 135–145)

## 2024-07-09 LAB — CBC WITH DIFFERENTIAL/PLATELET
Abs Immature Granulocytes: 0.06 K/uL (ref 0.00–0.07)
Basophils Absolute: 0.1 K/uL (ref 0.0–0.1)
Basophils Relative: 1 %
Eosinophils Absolute: 0.4 K/uL (ref 0.0–0.5)
Eosinophils Relative: 5 %
HCT: 32.2 % — ABNORMAL LOW (ref 36.0–46.0)
Hemoglobin: 11.6 g/dL — ABNORMAL LOW (ref 12.0–15.0)
Immature Granulocytes: 1 %
Lymphocytes Relative: 33 %
Lymphs Abs: 2.9 K/uL (ref 0.7–4.0)
MCH: 29.8 pg (ref 26.0–34.0)
MCHC: 36 g/dL (ref 30.0–36.0)
MCV: 82.8 fL (ref 80.0–100.0)
Monocytes Absolute: 0.7 K/uL (ref 0.1–1.0)
Monocytes Relative: 8 %
Neutro Abs: 4.7 K/uL (ref 1.7–7.7)
Neutrophils Relative %: 52 %
Platelets: 312 K/uL (ref 150–400)
RBC: 3.89 MIL/uL (ref 3.87–5.11)
RDW: 13.7 % (ref 11.5–15.5)
WBC: 8.7 K/uL (ref 4.0–10.5)
nRBC: 0 % (ref 0.0–0.2)

## 2024-07-09 LAB — CBG MONITORING, ED
Glucose-Capillary: 137 mg/dL — ABNORMAL HIGH (ref 70–99)
Glucose-Capillary: 142 mg/dL — ABNORMAL HIGH (ref 70–99)
Glucose-Capillary: 154 mg/dL — ABNORMAL HIGH (ref 70–99)
Glucose-Capillary: 157 mg/dL — ABNORMAL HIGH (ref 70–99)
Glucose-Capillary: 166 mg/dL — ABNORMAL HIGH (ref 70–99)
Glucose-Capillary: 168 mg/dL — ABNORMAL HIGH (ref 70–99)
Glucose-Capillary: 170 mg/dL — ABNORMAL HIGH (ref 70–99)
Glucose-Capillary: 170 mg/dL — ABNORMAL HIGH (ref 70–99)
Glucose-Capillary: 175 mg/dL — ABNORMAL HIGH (ref 70–99)
Glucose-Capillary: 178 mg/dL — ABNORMAL HIGH (ref 70–99)
Glucose-Capillary: 183 mg/dL — ABNORMAL HIGH (ref 70–99)
Glucose-Capillary: 196 mg/dL — ABNORMAL HIGH (ref 70–99)

## 2024-07-09 LAB — COMPREHENSIVE METABOLIC PANEL WITH GFR
ALT: 24 U/L (ref 0–44)
AST: 29 U/L (ref 15–41)
Albumin: 3.5 g/dL (ref 3.5–5.0)
Alkaline Phosphatase: 58 U/L (ref 38–126)
Anion gap: 4 — ABNORMAL LOW (ref 5–15)
BUN: 5 mg/dL — ABNORMAL LOW (ref 6–20)
CO2: 19 mmol/L — ABNORMAL LOW (ref 22–32)
Calcium: 8.6 mg/dL — ABNORMAL LOW (ref 8.9–10.3)
Chloride: 117 mmol/L — ABNORMAL HIGH (ref 98–111)
Creatinine, Ser: 0.62 mg/dL (ref 0.44–1.00)
GFR, Estimated: >60 mL/min (ref >60)
Glucose, Bld: 153 mg/dL — ABNORMAL HIGH (ref 70–99)
Potassium: 3.1 mmol/L — ABNORMAL LOW (ref 3.5–5.1)
Sodium: 140 mmol/L (ref 135–145)
Total Bilirubin: 0.4 mg/dL (ref 0.0–1.2)
Total Protein: 6.4 g/dL — ABNORMAL LOW (ref 6.5–8.1)

## 2024-07-09 LAB — I-STAT CG4 LACTIC ACID, ED: Lactic Acid, Venous: 0.7 mmol/L (ref 0.5–1.9)

## 2024-07-09 LAB — BETA-HYDROXYBUTYRIC ACID
Beta-Hydroxybutyric Acid: 0.09 mmol/L (ref 0.05–0.27)
Beta-Hydroxybutyric Acid: 0.12 mmol/L (ref 0.05–0.27)

## 2024-07-09 LAB — GLUCOSE, CAPILLARY: Glucose-Capillary: 248 mg/dL — ABNORMAL HIGH (ref 70–99)

## 2024-07-09 LAB — MAGNESIUM: Magnesium: 1.9 mg/dL (ref 1.7–2.4)

## 2024-07-09 MED ORDER — ALBUTEROL SULFATE (2.5 MG/3ML) 0.083% IN NEBU: 2.5000 mg | INHALATION_SOLUTION | Freq: Two times a day (BID) | RESPIRATORY_TRACT | Status: DC | PRN

## 2024-07-09 MED ORDER — EZETIMIBE 10 MG PO TABS
10.0000 mg | ORAL_TABLET | Freq: Every day | ORAL | Status: DC
  Administered 2024-07-09 – 2024-07-11 (×3): 10 mg via ORAL
  Filled 2024-07-09 (×3): qty 1

## 2024-07-09 MED ORDER — POTASSIUM CHLORIDE 10 MEQ/100ML IV SOLN
10.0000 meq | INTRAVENOUS | Status: AC
  Administered 2024-07-09 (×4): 10 meq via INTRAVENOUS
  Filled 2024-07-09 (×4): qty 100

## 2024-07-09 MED ORDER — POTASSIUM CHLORIDE 10 MEQ/100ML IV SOLN
10.0000 meq | INTRAVENOUS | Status: AC
  Administered 2024-07-09 (×3): 10 meq via INTRAVENOUS
  Filled 2024-07-09 (×3): qty 100

## 2024-07-09 MED ORDER — POTASSIUM CHLORIDE 10 MEQ/100ML IV SOLN
10.0000 meq | INTRAVENOUS | Status: AC
  Administered 2024-07-09 (×2): 10 meq via INTRAVENOUS
  Filled 2024-07-09 (×2): qty 100

## 2024-07-09 MED ORDER — INSULIN ASPART 100 UNIT/ML IJ SOLN
0.0000 [IU] | Freq: Three times a day (TID) | INTRAMUSCULAR | Status: DC
  Administered 2024-07-10 (×2): 11 [IU] via SUBCUTANEOUS
  Filled 2024-07-09 (×2): qty 11

## 2024-07-09 MED ORDER — ATORVASTATIN CALCIUM 80 MG PO TABS
80.0000 mg | ORAL_TABLET | Freq: Every day | ORAL | Status: DC
  Administered 2024-07-09 – 2024-07-11 (×3): 80 mg via ORAL
  Filled 2024-07-09 (×3): qty 1

## 2024-07-09 MED ORDER — LOSARTAN POTASSIUM 50 MG PO TABS
50.0000 mg | ORAL_TABLET | Freq: Every day | ORAL | Status: DC
  Administered 2024-07-10 – 2024-07-12 (×3): 50 mg via ORAL
  Filled 2024-07-09 (×3): qty 1

## 2024-07-09 MED ORDER — MAGNESIUM OXIDE -MG SUPPLEMENT 400 (240 MG) MG PO TABS
800.0000 mg | ORAL_TABLET | Freq: Once | ORAL | Status: AC
  Administered 2024-07-09: 800 mg via ORAL
  Filled 2024-07-09: qty 2

## 2024-07-09 MED ORDER — POTASSIUM CHLORIDE CRYS ER 20 MEQ PO TBCR
40.0000 meq | EXTENDED_RELEASE_TABLET | Freq: Once | ORAL | Status: AC
  Administered 2024-07-09: 40 meq via ORAL
  Filled 2024-07-09: qty 2

## 2024-07-09 MED ORDER — LEVOTHYROXINE SODIUM 75 MCG PO TABS
75.0000 ug | ORAL_TABLET | Freq: Every day | ORAL | Status: DC
  Administered 2024-07-10 – 2024-07-12 (×3): 75 ug via ORAL
  Filled 2024-07-09 (×3): qty 1

## 2024-07-09 MED ORDER — KCL IN DEXTROSE-NACL 20-5-0.45 MEQ/L-%-% IV SOLN
INTRAVENOUS | Status: AC
  Filled 2024-07-09 (×3): qty 1000

## 2024-07-09 MED ORDER — NEBIVOLOL HCL 10 MG PO TABS
10.0000 mg | ORAL_TABLET | Freq: Every day | ORAL | Status: DC
  Administered 2024-07-09 – 2024-07-12 (×4): 10 mg via ORAL
  Filled 2024-07-09 (×5): qty 1

## 2024-07-09 MED ORDER — MAGNESIUM SULFATE IN D5W 1-5 GM/100ML-% IV SOLN
1.0000 g | Freq: Once | INTRAVENOUS | Status: AC
  Administered 2024-07-09: 1 g via INTRAVENOUS
  Filled 2024-07-09: qty 100

## 2024-07-09 MED ORDER — METRONIDAZOLE 500 MG/100ML IV SOLN
500.0000 mg | Freq: Two times a day (BID) | INTRAVENOUS | Status: DC
  Administered 2024-07-09 – 2024-07-12 (×7): 500 mg via INTRAVENOUS
  Filled 2024-07-09 (×7): qty 100

## 2024-07-09 MED ORDER — INSULIN GLARGINE 100 UNIT/ML ~~LOC~~ SOLN
10.0000 [IU] | Freq: Two times a day (BID) | SUBCUTANEOUS | Status: DC
  Administered 2024-07-09 – 2024-07-10 (×2): 10 [IU] via SUBCUTANEOUS
  Filled 2024-07-09 (×3): qty 0.1

## 2024-07-09 MED ORDER — HYDRALAZINE HCL 20 MG/ML IJ SOLN: 5.0000 mg | Freq: Four times a day (QID) | INTRAMUSCULAR | Status: DC | PRN

## 2024-07-09 MED ORDER — CLINDAMYCIN PHOSPHATE 900 MG/50ML IV SOLN
900.0000 mg | Freq: Three times a day (TID) | INTRAVENOUS | Status: DC
  Administered 2024-07-09 – 2024-07-12 (×10): 900 mg via INTRAVENOUS
  Filled 2024-07-09 (×13): qty 50

## 2024-07-09 MED ORDER — FLUOXETINE HCL 20 MG PO CAPS
40.0000 mg | ORAL_CAPSULE | Freq: Two times a day (BID) | ORAL | Status: DC
  Administered 2024-07-09 – 2024-07-12 (×6): 40 mg via ORAL
  Filled 2024-07-09 (×6): qty 2

## 2024-07-09 NOTE — Care Plan (Addendum)
 Patient remains at [facility] at this time.  She was accepted by hospitalist service earlier this night on account of acute DKA.  Repeat labs ordered shows bicarb to be 15 with potassium of 2.7.  Patient is currently on D5 LR.  I have advised for fluids to be changed to D5 half-normal saline+ 20 mEq potassium fluid.  Patient will also need to continue on insulin  gtt with correction of potassium. Potassium replacement was also offered.

## 2024-07-09 NOTE — ED Notes (Signed)
 Pt arrived to [facility] ED at this time and care assumed from [facility].

## 2024-07-09 NOTE — ED Notes (Signed)
 A heart healthy/carb modified tray ordered.

## 2024-07-09 NOTE — Progress Notes (Addendum)
 Addendum: Patient to [facility] transfer center awaiting callback for further plan of care.  Addendum: No capacity at [facility] and pt has been declined.  We will continue abx and defer to am team to follow and navigate transfer and consult as deemed appropriate.

## 2024-07-09 NOTE — H&P (Addendum)
 History and Physical    Patient: Cheryl [name] FMW:[id number] DOB: 04-02-1966 DOA: 07/08/2024 DOS: the patient was seen and examined on 07/09/2024 . PCP: Frederik Charleston, MD  Patient coming from: [facility] Chief complaint: Chief Complaint  Patient presents with   Hyperglycemia   HPI:  Cheryl [name] is a 58 y.o. female with past medical history  of diabetes mellitus type 2 with history of DKA and recent tooth extraction on the third and developing swelling and pain I think a day or 2 post procedure and she went and saw her dentist who did do a drainage and looking at the drainage and her sugars she sent her to the emergency room with concerns for an infection of course and then hyperglycemia. Friends at bedside and daughter is at work and has a young child.  Reached out to dentist on-call, ENT, neither of them manage this dental abscess patient needs to be transferred for maxillofacial surgery persistent infection status post I&D on clindamycin  for the past several days.   ED Course:  Vital signs in the ED were notable for the following:  Vitals:   07/09/24 1500 07/09/24 1600 07/09/24 1700 07/09/24 1709  BP: 119/80 (!) 154/64 (!) 141/99   Pulse: 72 71 70   Temp:   98.4 F (36.9 C) 98.4 F (36.9 C)  Resp: (!) 21 16 18    Height:      Weight:      SpO2: 100% 99% 100%   TempSrc:   Oral Oral  BMI (Calculated):       >>ED evaluation thus far shows: Most recent CMP shows potassium of 3.2 bicarb of 16 anion gap of 13, LFTs from earlier CMP are within normal limits. Initial CBC yesterday showed a white count of 9.7 hemoglobin of 11.7 platelets of 313.   Repeat CMP beta hydroxy lactic acid ordered and pending.   >>While in the ED patient received the following: Medications  insulin  regular, human (MYXREDLIN ) 100 units/ 100 mL infusion (6.5 Units/hr Intravenous Rate/Dose Change 07/09/24 0906)  lactated ringers  infusion (0 mLs Intravenous Stopped 07/08/24 2107)  dextrose  5 %  in lactated ringers  infusion (0 mLs Intravenous Stopped 07/09/24 0045)  dextrose  50 % solution 0-50 mL (has no administration in time range)  dextrose  5 % and 0.45 % NaCl with KCl 20 mEq/L infusion ( Intravenous New Bag/Given 07/09/24 1106)  potassium chloride  10 mEq in 100 mL IVPB (10 mEq Intravenous New Bag/Given 07/09/24 1307)  sodium chloride  0.9 % bolus 1,000 mL (0 mLs Intravenous Stopped 07/08/24 1655)  potassium chloride  SA (KLOR-CON  M) CR tablet 20 mEq (20 mEq Oral Given 07/08/24 1645)  acetaminophen  (TYLENOL ) tablet 1,000 mg (1,000 mg Oral Given 07/08/24 1644)  sodium chloride  0.9 % bolus 1,000 mL (0 mLs Intravenous Stopped 07/08/24 1800)  insulin  aspart (novoLOG ) injection 10 Units (10 Units Intravenous Given 07/08/24 1646)  lactated ringers  bolus 1,670 mL (0 mLs Intravenous Stopped 07/08/24 2002)  potassium chloride  10 mEq in 100 mL IVPB (0 mEq Intravenous Stopped 07/08/24 2322)  metroNIDAZOLE  (FLAGYL ) tablet 500 mg (500 mg Oral Given 07/08/24 2216)  acetaminophen  (TYLENOL ) tablet 1,000 mg (1,000 mg Oral Given 07/08/24 2226)  potassium chloride  10 mEq in 100 mL IVPB (0 mEq Intravenous Stopped 07/09/24 0349)  magnesium  oxide (MAG-OX) tablet 800 mg (800 mg Oral Given 07/09/24 0938)  potassium chloride  SA (KLOR-CON  M) CR tablet 40 mEq (40 mEq Oral Given 07/09/24 0938)   Review of Systems  Constitutional:  Positive for malaise/fatigue.  HENT:  Left jaw pain.    Past Medical History:  Diagnosis Date   Allergy    Anxiety    Asthma    Cancer (HCC)    melanoma 2015 upper Left arm    Chronic airway obstruction, not elsewhere classified    Chronic pain    q where; herniated disc in my tailbone (02/09/2013)   DDD (degenerative disc disease)    CERVIAL AND LUMBAR   Depression    Edema    Fatty liver disease, nonalcoholic    Fibromyalgia    severe (02/09/2013)   Gastroparesis    from DM and chronic narcotic use   GERD (gastroesophageal reflux disease)    Human  parvovirus infection    Hyperlipidemia    Hypertension    Hypothyroidism    Interstitial cystitis    Migraine headache    weekly; worse lately (02/09/2013)   OSA (obstructive sleep apnea)    have mask;; don't use it; no one came out to check it (02/09/2013)   Osteoporosis    Peripheral neuropathy    severe (02/09/2013)   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   PONV (postoperative nausea and vomiting)    Positive PPD    Shortness of breath    at anytime; it's gotten worse recently (02/09/2013)   Sleep apnea    Type II diabetes mellitus (HCC)    Vitamin D  deficiency    Vocal cord dysfunction    Past Surgical History:  Procedure Laterality Date   ABDOMINAL HYSTERECTOMY  1993   ANTERIOR CERVICAL DECOMP/DISCECTOMY FUSION  2004   arm surgery     left 2018   BRONCHOSCOPY  06/28/2019   BUBBLE STUDY  03/27/2020   Procedure: BUBBLE STUDY;  Surgeon: Delford Maude BROCKS, MD;  Location: [facility];  Service: Cardiovascular;;   CARPAL TUNNEL RELEASE Bilateral ?1980's   CHOLECYSTECTOMY  ?1990   COLONOSCOPY     HIP SURGERY Right 2002   did something to the tibula band (02/09/2013)   KNEE ARTHROSCOPY Left 1990's   fell; clot behind knee cap had to be removed (02/09/2013)   LOOP RECORDER INSERTION N/A 03/27/2020   Procedure: LOOP RECORDER INSERTION;  Surgeon: Kelsie Agent, MD;  Location: [facility];  Service: Cardiovascular;  Laterality: N/A;   NASAL SINUS SURGERY  1986; 1990's   i've had 3 (02/09/2013)   RIGHT/LEFT HEART CATH AND CORONARY ANGIOGRAPHY N/A 08/26/2017   Procedure: RIGHT/LEFT HEART CATH AND CORONARY ANGIOGRAPHY;  Surgeon: Claudene Victory ORN, MD;  Location: [facility];  Service: Cardiovascular;  Laterality: N/A;   SHOULDER ARTHROSCOPY W/ ROTATOR CUFF REPAIR Right    had to go deep in rotator cuff (02/09/2013)   TEE WITHOUT CARDIOVERSION N/A 03/27/2020   Procedure: TRANSESOPHAGEAL ECHOCARDIOGRAM (TEE);  Surgeon:  Delford Maude BROCKS, MD;  Location: [facility] ENDOSCOPY;  Service: Cardiovascular;  Laterality: N/A;   VIDEO BRONCHOSCOPY Bilateral 06/28/2019   Procedure: VIDEO BRONCHOSCOPY WITH FLUORO;  Surgeon: Mannam, Praveen, MD;  Location: [facility];  Service: Cardiopulmonary;  Laterality: Bilateral;    reports that she quit smoking about 15 years ago. Her smoking use included cigarettes. She started smoking about 18 years ago. She has a 0.3 pack-year smoking history. She has never used smokeless tobacco. She reports that she does not drink alcohol and does not use drugs. Allergies[1] Family History  Problem Relation Age of Onset   Coronary artery disease Father    Diabetes Brother    Diabetes Brother  Breast cancer Maternal Aunt    Breast cancer Cousin    Breast cancer Cousin    Colon cancer Neg Hx    Esophageal cancer Neg Hx    Liver cancer Neg Hx    Pancreatic cancer Neg Hx    Rectal cancer Neg Hx    Stomach cancer Neg Hx    Prior to Admission medications  Medication Sig Start Date End Date Taking? Authorizing Provider  amLODipine  (NORVASC ) 2.5 MG tablet Take 2.5 mg by mouth daily.   Yes [provider]  atorvastatin  (LIPITOR ) 80 MG tablet Take 80 mg by mouth daily. 10/03/20  Yes [provider]  clopidogrel  (PLAVIX ) 75 MG tablet TAKE 1 TABLET (75 MG TOTAL) BY MOUTH DAILY. 02/11/21  Yes Rosemarie Eather RAMAN, MD  ezetimibe  (ZETIA ) 10 MG tablet Take 10 mg by mouth daily.   Yes [provider]  FLUoxetine  (PROZAC ) 40 MG capsule Take 40 mg by mouth 2 (two) times daily. 12/30/18  Yes [provider]  HUMULIN  R U-500 KWIKPEN 500 UNIT/ML kwikpen Inject into the skin 2 (two) times daily. 190 units in the morning and 170 units at night 02/26/16  Yes [provider]  hyoscyamine  (LEVSIN ) 0.125 MG tablet TAKE 1 TABLET EVERY 6 HOURS AS NEEDED FOR CRAMPING. 06/08/24  Yes Craig Palma R, PA-C  ibuprofen  (ADVIL ,MOTRIN ) 800 MG tablet Take 800 mg by mouth every 8 (eight) hours as  needed (pain).    Yes [provider]  levothyroxine  (SYNTHROID , LEVOTHROID) 75 MCG tablet Take 75 mcg by mouth daily before breakfast.   Yes [provider]  linaclotide  (LINZESS ) 290 MCG CAPS capsule Take 1 capsule (290 mcg total) by mouth daily before breakfast. 02/12/24 02/06/25 Yes Craig Palma R, PA-C  losartan  (COZAAR ) 100 MG tablet Take 100 mg by mouth daily. 02/24/20  Yes [provider]  metroNIDAZOLE  (FLAGYL ) 500 MG tablet Take 1 tablet (500 mg total) by mouth 2 (two) times daily. 07/06/24  Yes Cardama, Raynell Moder, MD  nebivolol  (BYSTOLIC ) 10 MG tablet Take 1 tablet (10 mg total) by mouth daily. Please call [phone] to schedule an overdue appointment for future refills. Thank you. 3rd final attempt. 04/08/22  Yes Nishan, Peter C, MD  nystatin -triamcinolone  (MYCOLOG II) cream Apply 1 application topically 2 (two) times daily as needed (yeast in skin folds).  02/25/19  Yes [provider]  pantoprazole  (PROTONIX ) 40 MG tablet Take 1 tablet (40 mg total) by mouth 2 (two) times daily. 09/15/22  Yes Aneita Gwendlyn DASEN, MD  promethazine  (PHENERGAN ) 25 MG tablet TAKE 1 TABLET TWICE DAILY AS NEEDED FOR NAUSEA 05/29/22  Yes Craig Palma R, PA-C  rizatriptan (MAXALT) 10 MG tablet Take 10 mg by mouth as needed for migraine. 03/30/21  Yes [provider]  tiZANidine  (ZANAFLEX ) 4 MG tablet Take 4 mg by mouth 4 (four) times daily. May hold one dose if needed to operate motor vehicle. 12/30/18  Yes [provider]  TRELEGY ELLIPTA  100-62.5-25 MCG/ACT AEPB INHALE 1 PUFF INTO THE LUNGS DAILY Patient taking differently: Inhale 2 puffs into the lungs daily. 07/23/23  Yes Young, Reggy D, MD  valACYclovir (VALTREX) 1000 MG tablet Take 2,000 mg by mouth as needed. 09/14/23  Yes [provider]  albuterol  (VENTOLIN  HFA) 108 (90 Base) MCG/ACT inhaler INHALE 2 PUFFS EVERY 6 HOURS AS NEEDED FOR SHORTNESS OF BREATH OR WHEEZING . NEED OFFICE VISIT FOR  FURTHER REFILLS. Patient taking differently: Inhale 2 puffs into the lungs 2 (two) times daily. 12/31/23  Neysa Reggy BIRCH, MD  Blood Glucose Monitoring Suppl (TRUE METRIX METER) w/Device KIT 1 each. 11/30/20   [provider]  DROPLET PEN NEEDLES 32G X 4 MM MISC 100 each.    [provider]  Insulin  Pen Needle (DROPLET PEN NEEDLES) 32G X 4 MM MISC 100 each.    [provider]  ipratropium-albuterol  (DUONEB) 0.5-2.5 (3) MG/3ML SOLN Take 3 mLs by nebulization every 6 (six) hours as needed. Take 3mL by nebulization every 4-6 hours if needed 09/29/23   Neysa Reggy BIRCH, MD  Respiratory Therapy Supplies (NEBULIZER MASK ADULT/TUBING) MISC 1 each by Does not apply route as directed. 09/29/23   Neysa Reggy BIRCH, MD  TRUE METRIX BLOOD GLUCOSE TEST test strip 1 each by Other route as needed. 01/11/24   [provider]  TRUEplus Lancets 33G MISC 100 each.    [provider]                                                                                 Vitals:   07/09/24 1500 07/09/24 1600 07/09/24 1700 07/09/24 1709  BP: 119/80 (!) 154/64 (!) 141/99   Pulse: 72 71 70   Resp: (!) 21 16 18    Temp:   98.4 F (36.9 C) 98.4 F (36.9 C)  TempSrc:   Oral Oral  SpO2: 100% 99% 100%   Weight:      Height:       Physical Exam Vitals reviewed.  Constitutional:      General: She is not in acute distress.    Appearance: She is obese. She is not ill-appearing.  HENT:     Head: Normocephalic and atraumatic.     Jaw: Tenderness, swelling and pain on movement present.   Eyes:     Extraocular Movements: Extraocular movements intact.  Cardiovascular:     Rate and Rhythm: Normal rate and regular rhythm.     Pulses: Normal pulses.     Heart sounds: Normal heart sounds.  Pulmonary:     Effort: Pulmonary effort is normal.     Breath sounds: Normal breath sounds.  Abdominal:     General: There is no distension.     Palpations: Abdomen is soft.     Tenderness: There is  no abdominal tenderness.  Musculoskeletal:     Right lower leg: No edema.     Left lower leg: No edema.  Neurological:     General: No focal deficit present.     Mental Status: She is alert and oriented to person, place, and time.     Labs on Admission: I have personally reviewed following labs and imaging studies CBC: Recent Labs  Lab 07/05/24 1629 07/08/24 1155 07/08/24 1213 07/08/24 1548 07/08/24 1558 07/08/24 1812 07/09/24 1509  WBC 13.8* 11.3*  --  9.7  --   --  8.7  NEUTROABS 9.0* 7.8*  --  5.7  --   --  4.7  HGB 13.5 12.0 11.9* 11.7* 11.2* 9.9* 11.6*  HCT 38.5 34.4* 35.0* 32.9* 33.0* 29.0* 32.2*  MCV 85.4 84.9  --  83.3  --   --  82.8  PLT 424* 380  --  313  --   --  312   Basic Metabolic Panel: Recent Labs  Lab 07/08/24 2336 07/09/24 0306 07/09/24 0758 07/09/24 1321 07/09/24 1509  NA 142 140 140 140 140  K 2.7* 3.2* 2.9* 3.6 3.1*  CL 113* 111 111 120* 117*  CO2 15* 16* 17* 15* 19*  GLUCOSE 167* 170* 204* 171* 153*  BUN 12 10 7  5* 5*  CREATININE 0.66 0.63 0.60 0.51 0.62  CALCIUM  9.2 9.0 9.1 7.7* 8.6*  MG  --   --  1.9  --   --    GFR: Estimated Creatinine Clearance: 77.8 mL/min (by C-G formula based on SCr of 0.62 mg/dL). Liver Function Tests: Recent Labs  Lab 07/05/24 1629 07/08/24 1155 07/08/24 1548 07/09/24 1509  AST 14* 26 22 29   ALT 18 23 22 24   ALKPHOS 73 64 82 58  BILITOT 0.6 0.4 0.3 0.4  PROT 7.8 7.2 7.3 6.4*  ALBUMIN  4.3 3.9 4.3 3.5   No results for input(s): LIPASE, AMYLASE in the last 168 hours. No results for input(s): AMMONIA in the last 168 hours. Recent Labs    02/12/24 1511 07/05/24 1629 07/08/24 1155 07/08/24 1548 07/08/24 2219 07/08/24 2336 07/09/24 0306 07/09/24 0758 07/09/24 1321 07/09/24 1509  BUN 14 22* 19 19 12 12 10 7  5* 5*  CREATININE 0.71 1.26* 1.01* 0.96 0.66 0.66 0.63 0.60 0.51 0.62    Cardiac Enzymes: No results for input(s): CKTOTAL, CKMB, CKMBINDEX, TROPONINI in the last 168 hours. BNP  (last 3 results) No results for input(s): PROBNP in the last 8760 hours. HbA1C: No results for input(s): HGBA1C in the last 72 hours. CBG: Recent Labs  Lab 07/09/24 0722 07/09/24 0927 07/09/24 1059 07/09/24 1303 07/09/24 1656  GLUCAP 183* 196* 178* 166* 137*   Lipid Profile: No results for input(s): CHOL, HDL, LDLCALC, TRIG, CHOLHDL, LDLDIRECT in the last 72 hours. Thyroid  Function Tests: No results for input(s): TSH, T4TOTAL, FREET4, T3FREE, THYROIDAB in the last 72 hours. Anemia Panel: No results for input(s): VITAMINB12, FOLATE, FERRITIN, TIBC, IRON, RETICCTPCT in the last 72 hours. Urine analysis:    Component Value Date/Time   COLORURINE YELLOW 07/08/2024 1548   APPEARANCEUR CLEAR 07/08/2024 1548   LABSPEC 1.020 07/08/2024 1548   PHURINE 6.5 07/08/2024 1548   GLUCOSEU >1,000 (A) 07/08/2024 1548   GLUCOSEU >=1000 (A) 02/12/2024 1511   HGBUR SMALL (A) 07/08/2024 1548   BILIRUBINUR NEGATIVE 07/08/2024 1548   KETONESUR NEGATIVE 07/08/2024 1548   PROTEINUR TRACE (A) 07/08/2024 1548   UROBILINOGEN 0.2 02/12/2024 1511   NITRITE NEGATIVE 07/08/2024 1548   LEUKOCYTESUR SMALL (A) 07/08/2024 1548   Radiological Exams on Admission: DG Chest Portable 1 View Result Date: 07/08/2024 EXAM: 1 VIEW(S) XRAY OF THE CHEST 07/08/2024 04:45:00 PM COMPARISON: 11/04/2022. CLINICAL HISTORY: hyperglycemia FINDINGS: LINES, TUBES AND DEVICES: Left chest atrial loop recorder noted. LUNGS AND PLEURA: No focal pulmonary opacity. No pleural effusion. No pneumothorax. HEART AND MEDIASTINUM: No acute abnormality of the cardiac and mediastinal silhouettes. BONES AND SOFT TISSUES: Partially visible lower cervical ACDF hardware noted. No acute osseous abnormality. IMPRESSION: 1. No acute cardiopulmonary process. Electronically signed by: Pinkie Pebbles MD 07/08/2024 05:50 PM EST RP Workstation: [id number]   Data Reviewed: Relevant notes from primary care and specialist  visits, past discharge summaries as available in EHR, including Care Everywhere . Prior diagnostic testing as pertinent to current admission diagnoses, Updated medications and problem lists for reconciliation .ED course, including vitals, labs, imaging, treatment and response to treatment,Triage notes, nursing and pharmacy notes and ED provider's notes.Notable results  as noted in HPI.Discussed case with EDMD/ ED APP/ or Specialty MD on call and as needed.  Assessment & Plan  >> Left posterior mandible abscess: Persistent swelling/ pain , will cont pt in iv abx with clindamycin  and flagyl  and initiate transfer. For same.  >>DKA: 2/2 to infection and AG is closed but waiting on BHA / repeat cmp will manage accordingly.  Currently managed patient on sliding scale insulin  regimen, once patient is able to eat and chew and her gap is closed we will restart patient on a soft diet. Currently patient can start clear liquid diet if her gap is closed and BHA is normal.  >>Metabolic acidosis: Bicarb of 15 normal AG. Initially AGMA from DKA but suspect pt also has lactic acidosis.  Cont with gentle ivf hydration.  >>Hypokalemia: Replaced with 1 gm mag iv .       >> History of CVA: Patient is on Plavix  and will resume once patient's infection treated, and is cared by MFS. At baseline patient is nonfocal and has no deficits.   >> Essential hypertension: Vitals:   07/08/24 2300 07/09/24 0100 07/09/24 0200 07/09/24 0300  BP: (!) 152/64 (!) 141/65 (!) 151/63 (!) 152/71   07/09/24 0600 07/09/24 1015 07/09/24 1120 07/09/24 1313  BP: 134/60 (!) 149/67 133/76 138/65   07/09/24 1400 07/09/24 1500 07/09/24 1600 07/09/24 1700  BP: (!) 146/68 119/80 (!) 154/64 (!) 141/99  PTA meds include Nebivolol , losartan , amlodipine . Will continue patient on Nebivolol .  >>Anemia: Mild 2/2 to recent tooth extraction. Will follow.    DVT prophylaxis:  Heparin  Consults:  Maxillofacial surgery  Advance Care  Planning:    Code Status: Prior   Family Communication:  Friends at bedside daughter present for Disposition Plan:  Home Severity of Illness: The appropriate patient status for this patient is INPATIENT. Inpatient status is judged to be reasonable and necessary in order to provide the required intensity of service to ensure the patient's safety. The patient's presenting symptoms, physical exam findings, and initial radiographic and laboratory data in the context of their chronic comorbidities is felt to place them at high risk for further clinical deterioration. Furthermore, it is not anticipated that the patient will be medically stable for discharge from the hospital within 2 midnights of admission.   * I certify that at the point of admission it is my clinical judgment that the patient will require inpatient hospital care spanning beyond 2 midnights from the point of admission due to high intensity of service, high risk for further deterioration and high frequency of surveillance required.*  Unresulted Labs (From admission, onward)     Start     Ordered   07/09/24 1713  Hemoglobin A1c  Add-on,   AD       Comments: To assess prior glycemic control    07/09/24 1713   07/09/24 1300  Basic metabolic panel  Now then every 4 hours,   R (with STAT occurrences)      07/09/24 0926            Meds ordered this encounter  Medications   sodium chloride  0.9 % bolus 1,000 mL   potassium chloride  SA (KLOR-CON  M) CR tablet 20 mEq   acetaminophen  (TYLENOL ) tablet 1,000 mg   sodium chloride  0.9 % bolus 1,000 mL   DISCONTD: insulin  regular (NOVOLIN R) 100 units/mL injection 10 Units   insulin  aspart (novoLOG ) injection 10 Units   lactated ringers  bolus 1,670 mL   DISCONTD: insulin  regular, human (MYXREDLIN ) 100  units/ 100 mL infusion    EndoTool Goal Range::   140-180    Type of Diabetes:   Type 2    Mode of Therapy:   ENDOX1 for DKA    Start Method:   EndoTool to calculate   lactated  ringers  infusion   dextrose  5 % in lactated ringers  infusion   dextrose  50 % solution 0-50 mL   potassium chloride  10 mEq in 100 mL IVPB   metroNIDAZOLE  (FLAGYL ) tablet 500 mg   acetaminophen  (TYLENOL ) tablet 1,000 mg   potassium chloride  10 mEq in 100 mL IVPB   dextrose  5 % and 0.45 % NaCl with KCl 20 mEq/L infusion   magnesium  oxide (MAG-OX) tablet 800 mg   potassium chloride  10 mEq in 100 mL IVPB   potassium chloride  SA (KLOR-CON  M) CR tablet 40 mEq   metroNIDAZOLE  (FLAGYL ) IVPB 500 mg    Antibiotic Indication::   Other Indication (list below)    Other Indication::   oral abcess   clindamycin  (CLEOCIN ) IVPB 900 mg    Antibiotic Indication::   Other Indication (list below)    Other Indication::   oral abscess   albuterol  (PROVENTIL ) (2.5 MG/3ML) 0.083% nebulizer solution 2.5 mg   atorvastatin  (LIPITOR ) tablet 80 mg   ezetimibe  (ZETIA ) tablet 10 mg   FLUoxetine  (PROZAC ) capsule 40 mg   levothyroxine  (SYNTHROID ) tablet 75 mcg   nebivolol  (BYSTOLIC ) tablet 10 mg   hydrALAZINE  (APRESOLINE ) injection 5 mg   potassium chloride  10 mEq in 100 mL IVPB   magnesium  sulfate IVPB 1 g 100 mL   losartan  (COZAAR ) tablet 50 mg   insulin  glargine (LANTUS ) injection 10 Units   insulin  aspart (novoLOG ) injection 0-15 Units    Correction coverage::   Moderate (average weight, post-op)    CBG < 70::   implement hypoglycemia protocol    CBG 70 - 120::   0 units    CBG 121 - 150::   2 units    CBG 151 - 200::   3 units    CBG 201 - 250::   5 units    CBG 251 - 300::   8 units    CBG 301 - 350::   11 units    CBG 351 - 400::   15 units    CBG > 400:   call MD and obtain STAT lab verification     Orders Placed This Encounter  Procedures   DG Chest Portable 1 View   Comprehensive metabolic panel   CBC with Differential   Urinalysis, Routine w reflex microscopic -Urine, Clean Catch   Basic metabolic panel   Beta-hydroxybutyric acid   Magnesium    Basic metabolic panel   Beta-hydroxybutyric  acid   CBC with Differential/Platelet   Comprehensive metabolic panel with GFR   Hemoglobin A1c   Diet heart healthy/carb modified Room service appropriate? Yes with Assist; Fluid consistency: Thin   ED Cardiac monitoring   Initiate Carrier Fluid Protocol   Notify physician (specify)   If present, discontinue Insulin  Pump after IV Insulin  is initiated.   Do NOT use lab glucose values in EndoTool.  If CBG meter reads Critical High, enter 600.   IV insulin  infusion with sufficient glucose should be continued until MD determines acidosis is corrected and places transition orders.   Upon IV fluid bolus completion, place order for STAT BMET (LAB15) and call provider with results.   Cardiac Monitoring Continuous x 24 hours Indications for use: Other; other indications for use: critical  illness   Apply Diabetes Mellitus Care Plan   STAT CBG when hypoglycemia is suspected. If treated, recheck every 15 minutes after each treatment until CBG >/= 70 mg/dl   Refer to Hypoglycemia Protocol Sidebar Report for treatment of CBG < 70 mg/dl   No HS correction Insulin    Consult to hospitalist   ED Pulse oximetry, continuous   CBG monitoring, ED   I-Stat venous blood gas, ([facility] ED, [facility], [facility])   POC CBG, ED   I-Stat venous blood gas, ([facility] ED, [facility], [facility])   CBG monitoring, ED   CBG monitoring, ED   CBG monitoring, ED   CBG monitoring, ED   CBG monitoring, ED   CBG monitoring, ED   CBG monitoring, ED   CBG monitoring, ED   CBG monitoring, ED   CBG monitoring, ED   CBG monitoring, ED   CBG monitoring, ED   CBG monitoring, ED   CBG monitoring, ED   I-Stat CG4 Lactic Acid   CBG monitoring, ED   ED EKG   EKG 12-Lead   EKG   EKG   Insert peripheral IV   Admit to Inpatient (patient's expected length of stay will be greater than 2 midnights or inpatient only procedure)    Author: Mario LULLA Blanch, MD 12 pm- 8 pm. [facility]. 07/09/2024 5:13 PM Please note for any communication after hours  contact [facility] Assigned provider on call on Amion.       [1]  Allergies Allergen Reactions   Influenza Vaccines Anaphylaxis and Swelling    Throat swelling, flu symptoms   Iodinated Contrast Media Shortness Of Breath, Nausea And Vomiting, Nausea Only and Rash   Iodine-131 Shortness Of Breath, Nausea And Vomiting and Rash   Iohexol  Anaphylaxis, Hives and Swelling     Desc: hives,dyspnea; throat swelling; ok w/ premeds and omnipaque     Levofloxacin Anaphylaxis and Hives   Nucynta [Tapentadol Hydrochloride] Other (See Comments)    Hallucinations and  insomnia x3 days   Pregabalin Other (See Comments)    (lyrica)Reaction-Syncope & unable to speak   Sulfonamide Derivatives Anaphylaxis   Vantin  [Cefpodoxime ] Anaphylaxis, Hives, Rash and Cough   Vilazodone Hcl Other (See Comments)    (Viibryd) Hallucinations   Amoxicillin  Hives and Itching    Did it involve swelling of the face/tongue/throat, SOB, or low BP? No Did it involve sudden or severe rash/hives, skin peeling, or any reaction on the inside of your mouth or nose? Yes Did you need to seek medical attention at a hospital or doctor's office? No When did it last happen?3-4 years ago If all above answers are NO, may proceed with cephalosporin use. .   Biaxin [Clarithromycin] Hives   Carbamazepine Itching    (Tegretol)   Ciprofloxacin Hives   Dilaudid  [Hydromorphone  Hcl] Itching and Rash    redness   Iodides Nausea Only   Percocet [Oxycodone -Acetaminophen ] Rash

## 2024-07-09 NOTE — ED Notes (Signed)
 Monisha w/ cl called for [place] to ed DR Glendia Breeding accepting

## 2024-07-10 LAB — BASIC METABOLIC PANEL WITH GFR
Anion gap: 9 (ref 5–15)
BUN: 6 mg/dL (ref 6–20)
CO2: 19 mmol/L — ABNORMAL LOW (ref 22–32)
Calcium: 8.7 mg/dL — ABNORMAL LOW (ref 8.9–10.3)
Chloride: 108 mmol/L (ref 98–111)
Creatinine, Ser: 0.7 mg/dL (ref 0.44–1.00)
GFR, Estimated: >60 mL/min (ref >60)
Glucose, Bld: 327 mg/dL — ABNORMAL HIGH (ref 70–99)
Potassium: 3.4 mmol/L — ABNORMAL LOW (ref 3.5–5.1)
Sodium: 136 mmol/L (ref 135–145)

## 2024-07-10 LAB — GLUCOSE, CAPILLARY
Glucose-Capillary: 333 mg/dL — ABNORMAL HIGH (ref 70–99)
Glucose-Capillary: 321 mg/dL — ABNORMAL HIGH (ref 70–99)
Glucose-Capillary: 290 mg/dL — ABNORMAL HIGH (ref 70–99)
Glucose-Capillary: 78 mg/dL (ref 70–99)
Glucose-Capillary: 86 mg/dL (ref 70–99)

## 2024-07-10 LAB — HEMOGLOBIN A1C
Hgb A1c MFr Bld: 9.8 % — ABNORMAL HIGH (ref 4.8–5.6)
Mean Plasma Glucose: 234.56 mg/dL

## 2024-07-10 MED ORDER — INSULIN REGULAR HUMAN (CONC) 500 UNIT/ML ~~LOC~~ SOPN
120.0000 [IU] | PEN_INJECTOR | Freq: Three times a day (TID) | SUBCUTANEOUS | Status: DC
  Filled 2024-07-10: qty 3

## 2024-07-10 MED ORDER — POTASSIUM CHLORIDE CRYS ER 20 MEQ PO TBCR
40.0000 meq | EXTENDED_RELEASE_TABLET | Freq: Four times a day (QID) | ORAL | Status: AC
  Administered 2024-07-10 (×2): 40 meq via ORAL
  Filled 2024-07-10 (×2): qty 2

## 2024-07-10 MED ORDER — INSULIN REGULAR HUMAN (CONC) 500 UNIT/ML ~~LOC~~ SOPN: 170.0000 [IU] | PEN_INJECTOR | Freq: Every day | SUBCUTANEOUS | Status: DC

## 2024-07-10 MED ORDER — LINACLOTIDE 145 MCG PO CAPS
290.0000 ug | ORAL_CAPSULE | Freq: Every day | ORAL | Status: DC
  Administered 2024-07-11 – 2024-07-12 (×2): 290 ug via ORAL
  Filled 2024-07-10 (×3): qty 2

## 2024-07-10 MED ORDER — INSULIN REGULAR HUMAN (CONC) 500 UNIT/ML ~~LOC~~ SOPN
190.0000 [IU] | PEN_INJECTOR | Freq: Every day | SUBCUTANEOUS | Status: DC
  Filled 2024-07-10: qty 3

## 2024-07-10 MED ORDER — INSULIN REGULAR HUMAN (CONC) 500 UNIT/ML ~~LOC~~ SOPN
170.0000 [IU] | PEN_INJECTOR | Freq: Every day | SUBCUTANEOUS | Status: AC
  Administered 2024-07-10: 170 [IU] via SUBCUTANEOUS
  Filled 2024-07-10: qty 3

## 2024-07-10 MED ORDER — ENOXAPARIN SODIUM 40 MG/0.4ML IJ SOSY
40.0000 mg | PREFILLED_SYRINGE | INTRAMUSCULAR | Status: DC
  Administered 2024-07-10 – 2024-07-11 (×2): 40 mg via SUBCUTANEOUS
  Filled 2024-07-10 (×2): qty 0.4

## 2024-07-10 MED ORDER — CHLORHEXIDINE GLUCONATE 0.12 % MT SOLN
15.0000 mL | Freq: Four times a day (QID) | OROMUCOSAL | Status: DC
  Administered 2024-07-10 – 2024-07-12 (×7): 15 mL via OROMUCOSAL
  Filled 2024-07-10 (×5): qty 15

## 2024-07-10 MED ORDER — AMLODIPINE BESYLATE 5 MG PO TABS
2.5000 mg | ORAL_TABLET | Freq: Every day | ORAL | Status: DC
  Administered 2024-07-10 – 2024-07-12 (×3): 2.5 mg via ORAL
  Filled 2024-07-10 (×3): qty 1

## 2024-07-10 MED ORDER — LACTATED RINGERS IV SOLN: INTRAVENOUS | Status: AC

## 2024-07-10 MED ORDER — INSULIN REGULAR HUMAN (CONC) 500 UNIT/ML ~~LOC~~ SOPN: 120.0000 [IU] | PEN_INJECTOR | Freq: Three times a day (TID) | SUBCUTANEOUS | Status: DC

## 2024-07-10 NOTE — Plan of Care (Signed)
 Problem: Education: Goal: Ability to describe self-care measures that may prevent or decrease complications (Diabetes Survival Skills Education) will improve 07/10/2024 0732 by Cheryl Zachary CROME, RN Outcome: Progressing 07/10/2024 0710 by Cheryl Zachary CROME, RN Outcome: Progressing Goal: Individualized Educational Video(s) 07/10/2024 0732 by Cheryl Zachary CROME, RN Outcome: Progressing 07/10/2024 0710 by Cheryl Zachary CROME, RN Outcome: Progressing   Problem: Coping: Goal: Ability to adjust to condition or change in health will improve 07/10/2024 0732 by Cheryl Zachary CROME, RN Outcome: Progressing 07/10/2024 0710 by Cheryl Zachary CROME, RN Outcome: Progressing   Problem: Fluid Volume: Goal: Ability to maintain a balanced intake and output will improve 07/10/2024 0732 by Cheryl Zachary CROME, RN Outcome: Progressing 07/10/2024 0710 by Cheryl Zachary CROME, RN Outcome: Progressing   Problem: Health Behavior/Discharge Planning: Goal: Ability to identify and utilize available resources and services will improve 07/10/2024 0732 by Cheryl Zachary CROME, RN Outcome: Progressing 07/10/2024 0710 by Cheryl Zachary CROME, RN Outcome: Progressing Goal: Ability to manage health-related needs will improve 07/10/2024 0732 by Cheryl Zachary CROME, RN Outcome: Progressing 07/10/2024 0710 by Cheryl Zachary CROME, RN Outcome: Progressing   Problem: Metabolic: Goal: Ability to maintain appropriate glucose levels will improve 07/10/2024 0732 by Cheryl Zachary CROME, RN Outcome: Progressing 07/10/2024 0710 by Cheryl Zachary CROME, RN Outcome: Progressing   Problem: Nutritional: Goal: Maintenance of adequate nutrition will improve 07/10/2024 0732 by Cheryl Zachary CROME, RN Outcome: Progressing 07/10/2024 0710 by Cheryl Zachary CROME, RN Outcome: Progressing Goal: Progress toward achieving an optimal weight will improve 07/10/2024 0732 by Cheryl Zachary CROME, RN Outcome: Progressing 07/10/2024 0710 by Cheryl Zachary CROME, RN Outcome: Progressing   Problem: Skin  Integrity: Goal: Risk for impaired skin integrity will decrease 07/10/2024 0732 by Cheryl Zachary CROME, RN Outcome: Progressing 07/10/2024 0710 by Cheryl Zachary CROME, RN Outcome: Progressing   Problem: Tissue Perfusion: Goal: Adequacy of tissue perfusion will improve 07/10/2024 0732 by Cheryl Zachary CROME, RN Outcome: Progressing 07/10/2024 0710 by Cheryl Zachary CROME, RN Outcome: Progressing   Problem: Education: Goal: Knowledge of General Education information will improve Description: Including pain rating scale, medication(s)/side effects and non-pharmacologic comfort measures 07/10/2024 0732 by Cheryl Zachary CROME, RN Outcome: Progressing 07/10/2024 0710 by Cheryl Zachary CROME, RN Outcome: Progressing   Problem: Health Behavior/Discharge Planning: Goal: Ability to manage health-related needs will improve 07/10/2024 0732 by Cheryl Zachary CROME, RN Outcome: Progressing 07/10/2024 0710 by Cheryl Zachary CROME, RN Outcome: Progressing   Problem: Clinical Measurements: Goal: Ability to maintain clinical measurements within normal limits will improve 07/10/2024 0732 by Cheryl Zachary CROME, RN Outcome: Progressing 07/10/2024 0710 by Cheryl Zachary CROME, RN Outcome: Progressing Goal: Will remain free from infection 07/10/2024 0732 by Cheryl Zachary CROME, RN Outcome: Progressing 07/10/2024 0710 by Cheryl Zachary CROME, RN Outcome: Progressing Goal: Diagnostic test results will improve 07/10/2024 0732 by Cheryl Zachary CROME, RN Outcome: Progressing 07/10/2024 0710 by Cheryl Zachary CROME, RN Outcome: Progressing Goal: Respiratory complications will improve 07/10/2024 0732 by Cheryl Zachary CROME, RN Outcome: Progressing 07/10/2024 0710 by Cheryl Zachary CROME, RN Outcome: Progressing Goal: Cardiovascular complication will be avoided 07/10/2024 0732 by Cheryl Zachary CROME, RN Outcome: Progressing 07/10/2024 0710 by Cheryl Zachary CROME, RN Outcome: Progressing   Problem: Activity: Goal: Risk for activity intolerance will decrease 07/10/2024 0732 by Cheryl Zachary CROME, RN Outcome: Progressing 07/10/2024 0710 by Cheryl Zachary CROME, RN Outcome: Progressing   Problem: Nutrition: Goal: Adequate nutrition will be maintained 07/10/2024 0732 by Cheryl Zachary CROME, RN Outcome: Progressing 07/10/2024 0710 by Cheryl Zachary CROME, RN Outcome: Progressing  Problem: Coping: Goal: Level of anxiety will decrease 07/10/2024 0732 by Cheryl Zachary CROME, RN Outcome: Progressing 07/10/2024 0710 by Cheryl Zachary CROME, RN Outcome: Progressing   Problem: Elimination: Goal: Will not experience complications related to bowel motility 07/10/2024 0732 by Cheryl Zachary CROME, RN Outcome: Progressing 07/10/2024 0710 by Cheryl Zachary CROME, RN Outcome: Progressing Goal: Will not experience complications related to urinary retention 07/10/2024 0732 by Cheryl Zachary CROME, RN Outcome: Progressing 07/10/2024 0710 by Cheryl Zachary CROME, RN Outcome: Progressing   Problem: Pain Managment: Goal: General experience of comfort will improve and/or be controlled 07/10/2024 0732 by Cheryl Zachary CROME, RN Outcome: Progressing 07/10/2024 0710 by Cheryl Zachary CROME, RN Outcome: Progressing   Problem: Safety: Goal: Ability to remain free from injury will improve 07/10/2024 0732 by Cheryl Zachary CROME, RN Outcome: Progressing 07/10/2024 0710 by Cheryl Zachary CROME, RN Outcome: Progressing   Problem: Skin Integrity: Goal: Risk for impaired skin integrity will decrease 07/10/2024 0732 by Cheryl Zachary CROME, RN Outcome: Progressing 07/10/2024 0710 by Cheryl Zachary CROME, RN Outcome: Progressing

## 2024-07-10 NOTE — Progress Notes (Signed)
 PROGRESS NOTE    Cheryl [name]  FMW:[id number] DOB: 1965-09-27 DOA: 07/08/2024 PCP: Frederik Charleston, MD  Chief Complaint  Patient presents with   Hyperglycemia    Brief Narrative:   Cheryl [name] is a 58 y.o. female with past medical history  of diabetes mellitus type 2 with history of DKA, patient with multiple teeth extraction 06/29/2024 at [facility] dentist program, and another I&D 07/05/2024 tooth abscess, she was discharged from ED 12/10 p.o. Doxy and Flagyl  for finding of her tooth abscess, patient was sent to ED by her PCP on 12/12 for hyperglycemia, in ED she was found be in DKA, so admission requested .   Assessment & Plan:   Principal Problem:   DKA (diabetic ketoacidosis) (HCC)  Diabetes mellitus, type II, poorly controlled with hyperglycemia DKA -Patient with DKA on presentation-on the DKA protocol, DKA has closed, currently transition to subcu insulin . - Will change back to U-500 as I do anticipate she will have high insulin  need. - Will keep on U-500, 120 units 3 times daily before meals which allow more room stop titration and minimize risk of hypoglycemia. - Monitor CBC closely and adjust as needed. - poorly controlled with A1c of 9.8    Left posterior mandible abscess Status post multiple teeth extractions -Status post multiple tooth extraction 06/29/2024 -Status post I&D of left posterior mandible abscess 07/05/2024 -Continue with IV clindamycin  and Flagyl  -Chlorhexidine  swish and spit 4 times daily -Patient has a follow-up with [facility] Dr. Mliss Mussel on 09/15/2023, she was instructed to keep -Posterior oropharynx clear, no respiratory distress, no submandibular edema or signs of Ludwig's angina.  History of CVA -Will hold her Plavix  as likely will need further I&D upon her outpatient follow-up  Hyperlipidemia - Continue with statin and Zetia   Essential hypertension -Continue Cardizem  240 mg daily, Bystolic  10 mg  daily, losartan  25 mg daily. -HCTZ on hold.   Hypothyroidism -Continue Synthroid    GERD - Will keep on Pepcid   Anxiety/depression -Continue psych meds including Prozac , as needed Valium      DVT prophylaxis: Lovenox  Code Status: Full Family Communication: none at bedside Disposition:   Status is: Inpatient    Consultants:  none  Subjective:  She reports left jaw pain at baseline, but overall continues to improve over the last few days, anion gap has closed overnight and she was transition to subcu insulin   Objective: Vitals:   07/10/24 0000 07/10/24 0400 07/10/24 0741 07/10/24 1201  BP: (!) 141/66 (!) 148/67 (!) 160/70 (!) 150/70  Pulse: 63 62 64 63  Resp: 13 14 10 19   Temp: 98.8 F (37.1 C) 98.8 F (37.1 C) 98.6 F (37 C) 98.4 F (36.9 C)  TempSrc: Oral Oral Oral Oral  SpO2: 96% 96% 97% 98%  Weight:      Height:        Intake/Output Summary (Last 24 hours) at 07/10/2024 1442 Last data filed at 07/09/2024 1820 Gross per 24 hour  Intake 2107.47 ml  Output --  Net 2107.47 ml   Filed Weights   07/08/24 1513  Weight: 83.5 kg    Examination:  Awake Alert, Oriented X 3 Left jaw tenderness to palpation, mild edema, no erythema or stop mandibular mental swelling CTAB RRR +ve B.Sounds, Abd Soft No Cyanosis, Clubbing or edema, No new Rash or bruise       Data Reviewed: I have personally reviewed following labs and imaging studies  CBC: Recent Labs  Lab 07/05/24 1629 07/08/24 1155 07/08/24  1213 07/08/24 1548 07/08/24 1558 07/08/24 1812 07/09/24 1509  WBC 13.8* 11.3*  --  9.7  --   --  8.7  NEUTROABS 9.0* 7.8*  --  5.7  --   --  4.7  HGB 13.5 12.0 11.9* 11.7* 11.2* 9.9* 11.6*  HCT 38.5 34.4* 35.0* 32.9* 33.0* 29.0* 32.2*  MCV 85.4 84.9  --  83.3  --   --  82.8  PLT 424* 380  --  313  --   --  312    Basic Metabolic Panel: Recent Labs  Lab 07/09/24 0758 07/09/24 1321 07/09/24 1509 07/09/24 2037 07/10/24 0329  NA 140 140 140 138 136   K 2.9* 3.6 3.1* 4.1 3.4*  CL 111 120* 117* 111 108  CO2 17* 15* 19* 16* 19*  GLUCOSE 204* 171* 153* 414* 327*  BUN 7 5* 5* 6 6  CREATININE 0.60 0.51 0.62 0.63 0.70  CALCIUM  9.1 7.7* 8.6* 8.5* 8.7*  MG 1.9  --   --   --   --     GFR: Estimated Creatinine Clearance: 77.8 mL/min (by C-G formula based on SCr of 0.7 mg/dL).  Liver Function Tests: Recent Labs  Lab 07/05/24 1629 07/08/24 1155 07/08/24 1548 07/09/24 1509  AST 14* 26 22 29   ALT 18 23 22 24   ALKPHOS 73 64 82 58  BILITOT 0.6 0.4 0.3 0.4  PROT 7.8 7.2 7.3 6.4*  ALBUMIN  4.3 3.9 4.3 3.5    CBG: Recent Labs  Lab 07/09/24 1656 07/09/24 1739 07/09/24 2125 07/10/24 0759 07/10/24 1211  GLUCAP 137* 170* 248* 333* 321*     Recent Results (from the past 240 hours)  Urine Culture     Status: Abnormal   Collection Time: 07/05/24  4:42 PM   Specimen: Urine, Random  Result Value Ref Range Status   Specimen Description URINE, RANDOM  Final   Special Requests   Final    NONE Reflexed from [id number] Performed at [facility], [address], [place], [place] [address]    Culture 40,000 COLONIES/mL ESCHERICHIA COLI (A)  Final   Report Status 07/07/2024 FINAL  Final   Organism ID, Bacteria ESCHERICHIA COLI (A)  Final      Susceptibility   Escherichia coli - MIC*    AMPICILLIN <=2 SENSITIVE Sensitive     CEFAZOLIN  (URINE) Value in next row Sensitive      <=1 SENSITIVEThis is a modified FDA-approved test that has been validated and its performance characteristics determined by the reporting laboratory.  This laboratory is certified under the Clinical Laboratory Improvement Amendments CLIA as qualified to perform high complexity clinical laboratory testing.    CEFEPIME Value in next row Sensitive      <=1 SENSITIVEThis is a modified FDA-approved test that has been validated and its performance characteristics determined by the reporting laboratory.  This laboratory is certified under the Clinical Laboratory Improvement  Amendments CLIA as qualified to perform high complexity clinical laboratory testing.    ERTAPENEM Value in next row Sensitive      <=1 SENSITIVEThis is a modified FDA-approved test that has been validated and its performance characteristics determined by the reporting laboratory.  This laboratory is certified under the Clinical Laboratory Improvement Amendments CLIA as qualified to perform high complexity clinical laboratory testing.    CEFTRIAXONE Value in next row Sensitive      <=1 SENSITIVEThis is a modified FDA-approved test that has been validated and its performance characteristics determined by the reporting laboratory.  This  laboratory is certified under the Clinical Laboratory Improvement Amendments CLIA as qualified to perform high complexity clinical laboratory testing.    CIPROFLOXACIN Value in next row Sensitive      <=1 SENSITIVEThis is a modified FDA-approved test that has been validated and its performance characteristics determined by the reporting laboratory.  This laboratory is certified under the Clinical Laboratory Improvement Amendments CLIA as qualified to perform high complexity clinical laboratory testing.    GENTAMICIN Value in next row Sensitive      <=1 SENSITIVEThis is a modified FDA-approved test that has been validated and its performance characteristics determined by the reporting laboratory.  This laboratory is certified under the Clinical Laboratory Improvement Amendments CLIA as qualified to perform high complexity clinical laboratory testing.    NITROFURANTOIN Value in next row Sensitive      <=1 SENSITIVEThis is a modified FDA-approved test that has been validated and its performance characteristics determined by the reporting laboratory.  This laboratory is certified under the Clinical Laboratory Improvement Amendments CLIA as qualified to perform high complexity clinical laboratory testing.    TRIMETH/SULFA Value in next row Sensitive      <=1 SENSITIVEThis is a  modified FDA-approved test that has been validated and its performance characteristics determined by the reporting laboratory.  This laboratory is certified under the Clinical Laboratory Improvement Amendments CLIA as qualified to perform high complexity clinical laboratory testing.    AMPICILLIN/SULBACTAM Value in next row Sensitive      <=1 SENSITIVEThis is a modified FDA-approved test that has been validated and its performance characteristics determined by the reporting laboratory.  This laboratory is certified under the Clinical Laboratory Improvement Amendments CLIA as qualified to perform high complexity clinical laboratory testing.    PIP/TAZO Value in next row Sensitive      <=4 SENSITIVEThis is a modified FDA-approved test that has been validated and its performance characteristics determined by the reporting laboratory.  This laboratory is certified under the Clinical Laboratory Improvement Amendments CLIA as qualified to perform high complexity clinical laboratory testing.    MEROPENEM Value in next row Sensitive      <=4 SENSITIVEThis is a modified FDA-approved test that has been validated and its performance characteristics determined by the reporting laboratory.  This laboratory is certified under the Clinical Laboratory Improvement Amendments CLIA as qualified to perform high complexity clinical laboratory testing.    * 40,000 COLONIES/mL ESCHERICHIA COLI         Radiology Studies: DG Chest Portable 1 View Result Date: 07/08/2024 EXAM: 1 VIEW(S) XRAY OF THE CHEST 07/08/2024 04:45:00 PM COMPARISON: 11/04/2022. CLINICAL HISTORY: hyperglycemia FINDINGS: LINES, TUBES AND DEVICES: Left chest atrial loop recorder noted. LUNGS AND PLEURA: No focal pulmonary opacity. No pleural effusion. No pneumothorax. HEART AND MEDIASTINUM: No acute abnormality of the cardiac and mediastinal silhouettes. BONES AND SOFT TISSUES: Partially visible lower cervical ACDF hardware noted. No acute osseous  abnormality. IMPRESSION: 1. No acute cardiopulmonary process. Electronically signed by: Pinkie Pebbles MD 07/08/2024 05:50 PM EST RP Workstation: [id number]        Scheduled Meds:  atorvastatin   80 mg Oral QHS   ezetimibe   10 mg Oral QHS   FLUoxetine   40 mg Oral BID   [START ON 07/11/2024] insulin  regular human CONCENTRATED  120 Units Subcutaneous TID WC   And   insulin  regular human CONCENTRATED  170 Units Subcutaneous Q supper   levothyroxine   75 mcg Oral QAC breakfast   losartan   50 mg Oral Daily   nebivolol   10  mg Oral Daily   potassium chloride   40 mEq Oral Q6H   Continuous Infusions:  clindamycin  (CLEOCIN ) IV 900 mg (07/10/24 9176)   lactated ringers      metronidazole  500 mg (07/10/24 0825)     LOS: 1 day     Brayton Lye, MD [facility]   To contact the attending provider between 7A-7P or the covering provider during after hours 7P-7A, please log into the web site [email or web address] and access using universal [place] password for that web site. If you do not have the password, please call the hospital operator.  07/10/2024, 2:42 PM

## 2024-07-10 NOTE — Plan of Care (Signed)

## 2024-07-11 ENCOUNTER — Other Ambulatory Visit (HOSPITAL_COMMUNITY): Payer: Self-pay

## 2024-07-11 ENCOUNTER — Telehealth (HOSPITAL_COMMUNITY): Payer: Self-pay | Admitting: Pharmacy Technician

## 2024-07-11 LAB — CBC
HCT: 32.6 % — ABNORMAL LOW (ref 36.0–46.0)
Hemoglobin: 11.6 g/dL — ABNORMAL LOW (ref 12.0–15.0)
MCH: 29.8 pg (ref 26.0–34.0)
MCHC: 35.6 g/dL (ref 30.0–36.0)
MCV: 83.8 fL (ref 80.0–100.0)
Platelets: 275 K/uL (ref 150–400)
RBC: 3.89 MIL/uL (ref 3.87–5.11)
RDW: 13.5 % (ref 11.5–15.5)
WBC: 10.6 K/uL — ABNORMAL HIGH (ref 4.0–10.5)
nRBC: 0 % (ref 0.0–0.2)

## 2024-07-11 LAB — BASIC METABOLIC PANEL WITH GFR
Anion gap: 9 (ref 5–15)
BUN: 12 mg/dL (ref 6–20)
CO2: 18 mmol/L — ABNORMAL LOW (ref 22–32)
Calcium: 9.2 mg/dL (ref 8.9–10.3)
Chloride: 114 mmol/L — ABNORMAL HIGH (ref 98–111)
Creatinine, Ser: 0.56 mg/dL (ref 0.44–1.00)
GFR, Estimated: >60 mL/min (ref >60)
Glucose, Bld: 95 mg/dL (ref 70–99)
Potassium: 2.8 mmol/L — ABNORMAL LOW (ref 3.5–5.1)
Sodium: 141 mmol/L (ref 135–145)

## 2024-07-11 LAB — GLUCOSE, CAPILLARY
Glucose-Capillary: 124 mg/dL — ABNORMAL HIGH (ref 70–99)
Glucose-Capillary: 125 mg/dL — ABNORMAL HIGH (ref 70–99)
Glucose-Capillary: 148 mg/dL — ABNORMAL HIGH (ref 70–99)
Glucose-Capillary: 189 mg/dL — ABNORMAL HIGH (ref 70–99)
Glucose-Capillary: 60 mg/dL — ABNORMAL LOW (ref 70–99)
Glucose-Capillary: 90 mg/dL (ref 70–99)

## 2024-07-11 LAB — LACTIC ACID, PLASMA: Lactic Acid, Venous: 1 mmol/L (ref 0.5–1.9)

## 2024-07-11 LAB — C-REACTIVE PROTEIN: CRP: 0.8 mg/dL (ref <1.0)

## 2024-07-11 LAB — MAGNESIUM: Magnesium: 2.1 mg/dL (ref 1.7–2.4)

## 2024-07-11 LAB — PROCALCITONIN: Procalcitonin: <0.1 ng/mL

## 2024-07-11 MED ORDER — INSULIN REGULAR HUMAN (CONC) 500 UNIT/ML ~~LOC~~ SOPN
100.0000 [IU] | PEN_INJECTOR | Freq: Three times a day (TID) | SUBCUTANEOUS | Status: DC
  Administered 2024-07-11: 08:00:00 100 [IU] via SUBCUTANEOUS
  Filled 2024-07-11: qty 3

## 2024-07-11 MED ORDER — POTASSIUM CHLORIDE 10 MEQ/100ML IV SOLN
10.0000 meq | INTRAVENOUS | Status: AC
  Administered 2024-07-11 (×3): 10 meq via INTRAVENOUS
  Filled 2024-07-11 (×3): qty 100

## 2024-07-11 MED ORDER — POTASSIUM CHLORIDE CRYS ER 20 MEQ PO TBCR
40.0000 meq | EXTENDED_RELEASE_TABLET | Freq: Four times a day (QID) | ORAL | Status: AC
  Administered 2024-07-11 (×2): 40 meq via ORAL
  Filled 2024-07-11 (×2): qty 2

## 2024-07-11 MED ORDER — CLOPIDOGREL BISULFATE 75 MG PO TABS
75.0000 mg | ORAL_TABLET | Freq: Every day | ORAL | Status: DC
  Administered 2024-07-11: 15:00:00 75 mg via ORAL
  Filled 2024-07-11: qty 1

## 2024-07-11 MED ORDER — INSULIN REGULAR HUMAN (CONC) 500 UNIT/ML ~~LOC~~ SOPN
100.0000 [IU] | PEN_INJECTOR | Freq: Two times a day (BID) | SUBCUTANEOUS | Status: DC
  Administered 2024-07-11 – 2024-07-12 (×2): 100 [IU] via SUBCUTANEOUS
  Filled 2024-07-11 (×2): qty 3

## 2024-07-11 NOTE — Progress Notes (Signed)
 Nutrition Education Note   RD requested to provide nutrition education regarding diabetes during rounds.   Lab Results  Component Value Date   HGBA1C 9.8 (H) 07/10/2024    RD provided Carbohydrate Counting for People with Diabetes and Plate Method for Diabetes handouts in AVS from the Academy of Nutrition and Dietetics. Discussed different food groups and their effects on blood sugar, emphasizing carbohydrate-containing foods. Provided list of carbohydrates and recommended serving sizes of common foods. Discussed importance of controlled and consistent carbohydrate intake throughout the day. Provided examples of ways to balance meals/snacks. Discussed pairing carbohydrate with protein or fat to help with digestion of carbohydrates. Reviewed patients current beverages she drinks, primarily drinks sweet tea (not with zero calorie sweetener). Discussed transitioning to using artifical sweetener with all beverages to help with managing blood sugars. Teach back method used.   Expect good compliance.  Body mass index is 33.65 kg/m. Pt meets criteria for obese based on current BMI.  Patient reports that she was previously on Mounjaro in June and came off in July due to gastroparesis. Denies any recent weight loss, endorses weight gain.  Current diet order is Soft, no meals have been documented at this time. Labs and medications reviewed.   No further nutrition interventions warranted at this time. If additional nutrition issues arise, please re-consult RD.   Cheryl [name] RD, LDN Registered Dietitian I Please see AMION for contact information

## 2024-07-11 NOTE — TOC Initial Note (Signed)
 Transition of Care [facility]) - Initial/Assessment Note    Patient Details  Name: Cheryl [name] MRN: [id number] Date of Birth: Dec 11, 1965  Transition of Care [facility]) CM/SW Contact:    Landry DELENA Senters, RN Phone Number: 07/11/2024, 1:08 PM  Clinical Narrative:                 RR:[id number] history  of diabetes mellitus type 2 with history of DKA and recent tooth extraction on the third and developing swelling and pain I think a day or 2 post procedure and she went and saw her dentist who did do a drainage and looking at the drainage and her sugars she sent her to the emergency room with concerns for an infection of course and then hyperglycemia.   Patient lives with her daughter, reports she has support at home and transportation assistance when needed. Reports her brother will transport her home at d/c.   Patient has PCP, manages own medications, DME reviewed-cane, rollator, CPAP but patient doesn't use this due to not being able to sleep well with it on.   No apparent needs identified by CM. Will continue to follow.   Expected Discharge Plan: Home/Self Care Barriers to Discharge: Continued Medical Work up   Patient Goals and CMS Choice            Expected Discharge Plan and Services       Living arrangements for the past 2 months: Single Family Home                                      Prior Living Arrangements/Services Living arrangements for the past 2 months: Single Family Home Lives with:: Self, Adult Children Patient language and need for interpreter reviewed:: Yes Do you feel safe going back to the place where you live?: Yes      Need for Family Participation in Patient Care: Yes (Comment) Care giver support system in place?: Yes (comment) Current home services: DME (cane, rollator, CPAP but doesn't use) Criminal Activity/Legal Involvement Pertinent to Current Situation/Hospitalization: No - Comment as needed  Activities of Daily Living      Permission  Sought/Granted                  Emotional Assessment Appearance:: Developmentally appropriate Attitude/Demeanor/Rapport: Engaged Affect (typically observed): Calm Orientation: : Oriented to Self, Oriented to Place, Oriented to  Time, Oriented to Situation Alcohol / Substance Use: Not Applicable Psych Involvement: No (comment)  Admission diagnosis:  DKA (diabetic ketoacidosis) (HCC) [E11.10] Diabetic ketoacidosis without coma associated with type 2 diabetes mellitus (HCC) [E11.10] Patient Active Problem List   Diagnosis Date Noted   DKA (diabetic ketoacidosis) (HCC) 07/08/2024   Acute metabolic encephalopathy 03/24/2020   Stroke due to occlusion of left middle cerebral artery (HCC) 03/24/2020   Mixed diabetic hyperlipidemia associated with type 2 diabetes mellitus (HCC) 03/24/2020   Belching 10/11/2019   Abdominal pain, epigastric 10/11/2019   Bloating 10/11/2019   Loss of weight 10/11/2019   Early satiety 10/11/2019   Pneumothorax on right 06/28/2019   ILD (interstitial lung disease) (HCC)    Acute respiratory failure (HCC)    Medication management 06/06/2019   Lobar pneumonia, unspecified organism 05/26/2019   Fever 05/26/2019   Dysphagia 04/30/2018   Hoarseness 04/30/2018   Chest pain at rest 08/26/2017   Dyspnea 08/26/2017   OSA (obstructive sleep apnea) 01/05/2017   H/O melanoma excision 02/11/2016  Breast nodule 02/11/2016   Nausea without vomiting 12/12/2015   Leucocytosis 12/12/2015   Thrush 12/12/2015   Abdominal pain 12/12/2015   Thoracic aorta atherosclerosis 06/06/2015   Chest wall pain 12/25/2014   Snoring 06/29/2014   Obesity, unspecified 02/04/2014   DKA, type 2 (HCC) 02/03/2014   Hypothyroidism 03/02/2013   Insulin  dependent type 2 diabetes mellitus (HCC) 03/01/2013   Hypoglycemia 02/09/2013   Hypokalemia 02/09/2013   Thrush of mouth and esophagus (HCC) 09/12/2012   Obesity 05/25/2012   Cough due to angiotensin-converting enzyme inhibitor  07/12/2011   DDD (degenerative disc disease)    Edema    Human parvovirus infection    Polyarthropathy associated with another disorder    Fatty liver disease, nonalcoholic    Migraine headache    Positive PPD    Vitamin D  deficiency    Vocal cord dysfunction    RHINOSINUSITIS, 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 - [place], [place] - [address] [place] [address] Phone: [phone] Fax: [phone]  [facility] - [place], [place] - [address] [place] [address] Phone: [phone] Fax: [phone]     Social Drivers of Health (SDOH) Social History: SDOH Screenings   Food Insecurity: Unknown (07/10/2024)  Housing: High Risk (07/10/2024)  Transportation Needs: Patient Declined (07/10/2024)  Depression (PHQ2-9): Low Risk (10/01/2021)  Social Connections: Unknown (12/07/2021)   Received from [facility]  Tobacco Use: Medium Risk (07/08/2024)   SDOH Interventions:     Readmission Risk Interventions     No data to display

## 2024-07-11 NOTE — Plan of Care (Incomplete)

## 2024-07-11 NOTE — Plan of Care (Signed)

## 2024-07-11 NOTE — Discharge Instructions (Signed)

## 2024-07-11 NOTE — Telephone Encounter (Signed)
 Patient Product/process Development Scientist completed.    The patient is insured through [employer]. Patient has Medicare and is not eligible for a copay card, but may be able to apply for patient assistance or Medicare RX Payment Plan (Patient Must reach out to their plan, if eligible for payment plan), if available.    Ran test claim for Dexcom G7 Sensor and the current 30 day co-pay is $64.60.  Ran test claim for Freestyle Libre 3 Plus Sensor and the current 30 day co-pay is $29.13.  This test claim was processed through [facility]- copay amounts may vary at other pharmacies due to pharmacy/plan contracts, or as the patient moves through the different stages of their insurance plan.     Reyes Sharps, CPHT Pharmacy Technician Patient Advocate Specialist Lead [facility] Patient Advocate Team Direct Number: [phone]  Fax: [phone]

## 2024-07-11 NOTE — Plan of Care (Signed)
 Problem: Education: Goal: Ability to describe self-care measures that may prevent or decrease complications (Diabetes Survival Skills Education) will improve 07/11/2024 0719 by Elnor Zachary CROME, RN Outcome: Progressing 07/11/2024 0718 by Elnor Zachary CROME, RN Outcome: Progressing Goal: Individualized Educational Video(s) 07/11/2024 0719 by Elnor Zachary CROME, RN Outcome: Progressing 07/11/2024 0718 by Elnor Zachary CROME, RN Outcome: Progressing   Problem: Coping: Goal: Ability to adjust to condition or change in health will improve 07/11/2024 0719 by Elnor Zachary CROME, RN Outcome: Progressing 07/11/2024 0718 by Elnor Zachary CROME, RN Outcome: Progressing   Problem: Fluid Volume: Goal: Ability to maintain a balanced intake and output will improve 07/11/2024 0719 by Elnor Zachary CROME, RN Outcome: Progressing 07/11/2024 0718 by Elnor Zachary CROME, RN Outcome: Progressing   Problem: Health Behavior/Discharge Planning: Goal: Ability to identify and utilize available resources and services will improve 07/11/2024 0719 by Elnor Zachary CROME, RN Outcome: Progressing 07/11/2024 0718 by Elnor Zachary CROME, RN Outcome: Progressing Goal: Ability to manage health-related needs will improve 07/11/2024 0719 by Elnor Zachary CROME, RN Outcome: Progressing 07/11/2024 0718 by Elnor Zachary CROME, RN Outcome: Progressing   Problem: Metabolic: Goal: Ability to maintain appropriate glucose levels will improve 07/11/2024 0719 by Elnor Zachary CROME, RN Outcome: Progressing 07/11/2024 0718 by Elnor Zachary CROME, RN Outcome: Progressing   Problem: Nutritional: Goal: Maintenance of adequate nutrition will improve 07/11/2024 0719 by Elnor Zachary CROME, RN Outcome: Progressing 07/11/2024 0718 by Elnor Zachary CROME, RN Outcome: Progressing Goal: Progress toward achieving an optimal weight will improve 07/11/2024 0719 by Elnor Zachary CROME, RN Outcome: Progressing 07/11/2024 0718 by Elnor Zachary CROME, RN Outcome: Progressing   Problem: Skin  Integrity: Goal: Risk for impaired skin integrity will decrease 07/11/2024 0719 by Elnor Zachary CROME, RN Outcome: Progressing 07/11/2024 0718 by Elnor Zachary CROME, RN Outcome: Progressing   Problem: Tissue Perfusion: Goal: Adequacy of tissue perfusion will improve 07/11/2024 0719 by Elnor Zachary CROME, RN Outcome: Progressing 07/11/2024 0718 by Elnor Zachary CROME, RN Outcome: Progressing   Problem: Education: Goal: Knowledge of General Education information will improve Description: Including pain rating scale, medication(s)/side effects and non-pharmacologic comfort measures 07/11/2024 0719 by Elnor Zachary CROME, RN Outcome: Progressing 07/11/2024 0718 by Elnor Zachary CROME, RN Outcome: Progressing   Problem: Health Behavior/Discharge Planning: Goal: Ability to manage health-related needs will improve 07/11/2024 0719 by Elnor Zachary CROME, RN Outcome: Progressing 07/11/2024 0718 by Elnor Zachary CROME, RN Outcome: Progressing   Problem: Clinical Measurements: Goal: Ability to maintain clinical measurements within normal limits will improve 07/11/2024 0719 by Elnor Zachary CROME, RN Outcome: Progressing 07/11/2024 0718 by Elnor Zachary CROME, RN Outcome: Progressing Goal: Will remain free from infection 07/11/2024 0719 by Elnor Zachary CROME, RN Outcome: Progressing 07/11/2024 0718 by Elnor Zachary CROME, RN Outcome: Progressing Goal: Diagnostic test results will improve 07/11/2024 0719 by Elnor Zachary CROME, RN Outcome: Progressing 07/11/2024 0718 by Elnor Zachary CROME, RN Outcome: Progressing Goal: Respiratory complications will improve 07/11/2024 0719 by Elnor Zachary CROME, RN Outcome: Progressing 07/11/2024 0718 by Elnor Zachary CROME, RN Outcome: Progressing Goal: Cardiovascular complication will be avoided 07/11/2024 0719 by Elnor Zachary CROME, RN Outcome: Progressing 07/11/2024 0718 by Elnor Zachary CROME, RN Outcome: Progressing   Problem: Activity: Goal: Risk for activity intolerance will decrease 07/11/2024 0719 by Elnor Zachary CROME, RN Outcome: Progressing 07/11/2024 0718 by Elnor Zachary CROME, RN Outcome: Progressing   Problem: Nutrition: Goal: Adequate nutrition will be maintained 07/11/2024 0719 by Elnor Zachary CROME, RN Outcome: Progressing 07/11/2024 0718 by Elnor Zachary CROME, RN Outcome: Progressing  Problem: Coping: Goal: Level of anxiety will decrease 07/11/2024 0719 by Elnor Zachary CROME, RN Outcome: Progressing 07/11/2024 0718 by Elnor Zachary CROME, RN Outcome: Progressing   Problem: Elimination: Goal: Will not experience complications related to bowel motility 07/11/2024 0719 by Elnor Zachary CROME, RN Outcome: Progressing 07/11/2024 0718 by Elnor Zachary CROME, RN Outcome: Progressing Goal: Will not experience complications related to urinary retention 07/11/2024 0719 by Elnor Zachary CROME, RN Outcome: Progressing 07/11/2024 0718 by Elnor Zachary CROME, RN Outcome: Progressing   Problem: Pain Managment: Goal: General experience of comfort will improve and/or be controlled 07/11/2024 0719 by Elnor Zachary CROME, RN Outcome: Progressing 07/11/2024 0718 by Elnor Zachary CROME, RN Outcome: Progressing   Problem: Safety: Goal: Ability to remain free from injury will improve 07/11/2024 0719 by Elnor Zachary CROME, RN Outcome: Progressing 07/11/2024 0718 by Elnor Zachary CROME, RN Outcome: Progressing   Problem: Skin Integrity: Goal: Risk for impaired skin integrity will decrease 07/11/2024 0719 by Elnor Zachary CROME, RN Outcome: Progressing 07/11/2024 0718 by Elnor Zachary CROME, RN Outcome: Progressing

## 2024-07-11 NOTE — Progress Notes (Signed)
 PROGRESS NOTE    Cheryl [name]  FMW:[id number] DOB: August 31, 1965 DOA: 07/08/2024 PCP: Frederik Charleston, MD  Chief Complaint  Patient presents with   Hyperglycemia    Brief Narrative:   Cheryl [name] is a 58 y.o. female with past medical history  of diabetes mellitus type 2 with history of DKA, patient with multiple teeth extraction 06/29/2024 at [facility] dentist program, and another I&D 07/05/2024 tooth abscess, she was discharged from ED 12/10 p.o. Doxy and Flagyl  for finding of her tooth abscess, patient was sent to ED by her PCP on 12/12 for hyperglycemia, in ED she was found be in DKA, so admission requested .   Assessment & Plan:   Principal Problem:   DKA (diabetic ketoacidosis) (HCC)  Diabetes mellitus, type II, poorly controlled with hyperglycemia DKA -Patient with DKA on presentation-on the DKA protocol, DKA has closed, currently transition to subcu insulin . - Will change back to U-500 as I do anticipate she will have high insulin  need. -Will decrease her U-500 to 100 units twice daily given hypoglycemia overnight - poorly controlled with A1c of 9.8    Left posterior mandible abscess Status post multiple teeth extractions -Status post multiple tooth extraction 06/29/2024 -Status post I&D of left posterior mandible abscess 07/05/2024 -Continue with IV clindamycin  and Flagyl  -Chlorhexidine  swish and spit 4 times daily -Patient has a follow-up with [facility] Dr. Mliss Mussel on 07/14/2024, she was instructed to keep -Posterior oropharynx clear, no respiratory distress, no submandibular edema or signs of Ludwig's angina.  History of CVA - Continue with Plavix   Hyperlipidemia - Continue with statin and Zetia   Essential hypertension -Continue Cardizem  240 mg daily, Bystolic  10 mg daily, losartan  25 mg daily. -HCTZ on hold.   Hypothyroidism -Continue Synthroid    GERD - Will keep on Pepcid   Anxiety/depression -Continue psych  meds including Prozac , as needed Valium      DVT prophylaxis: Lovenox  Code Status: Full Family Communication: none at bedside Disposition:   Status is: Inpatient    Consultants:  none  Subjective:  She reports left jaw pain continues to improve, denies fever or chills  Objective: Vitals:   07/11/24 0030 07/11/24 0443 07/11/24 0821 07/11/24 1220  BP: (!) 151/69 (!) 141/53 (!) 145/71 (!) 163/74  Pulse:    71  Resp: 18   (!) 22  Temp: 98.1 F (36.7 C) 97.7 F (36.5 C) 97.6 F (36.4 C) 97.9 F (36.6 C)  TempSrc: Oral Oral Oral Oral  SpO2:    96%  Weight:      Height:       No intake or output data in the 24 hours ending 07/11/24 1321  Filed Weights   07/08/24 1513  Weight: 83.5 kg    Examination:  Awake Alert, Oriented X 3 Left jaw with tenderness to palpation posteriorly, mild edema, no erythema, CTAB RRR +ve B.Sounds, Abd Soft No Cyanosis, Clubbing or edema, No new Rash or bruise       Data Reviewed: I have personally reviewed following labs and imaging studies  CBC: Recent Labs  Lab 07/05/24 1629 07/08/24 1155 07/08/24 1213 07/08/24 1548 07/08/24 1558 07/08/24 1812 07/09/24 1509 07/11/24 0247  WBC 13.8* 11.3*  --  9.7  --   --  8.7 10.6*  NEUTROABS 9.0* 7.8*  --  5.7  --   --  4.7  --   HGB 13.5 12.0   < > 11.7* 11.2* 9.9* 11.6* 11.6*  HCT 38.5 34.4*   < > 32.9*  33.0* 29.0* 32.2* 32.6*  MCV 85.4 84.9  --  83.3  --   --  82.8 83.8  PLT 424* 380  --  313  --   --  312 275   < > = values in this interval not displayed.    Basic Metabolic Panel: Recent Labs  Lab 07/09/24 0758 07/09/24 1321 07/09/24 1509 07/09/24 2037 07/10/24 0329 07/11/24 0247  NA 140 140 140 138 136 141  K 2.9* 3.6 3.1* 4.1 3.4* 2.8*  CL 111 120* 117* 111 108 114*  CO2 17* 15* 19* 16* 19* 18*  GLUCOSE 204* 171* 153* 414* 327* 95  BUN 7 5* 5* 6 6 12   CREATININE 0.60 0.51 0.62 0.63 0.70 0.56  CALCIUM  9.1 7.7* 8.6* 8.5* 8.7* 9.2  MG 1.9  --   --   --   --  2.1     GFR: Estimated Creatinine Clearance: 77.8 mL/min (by C-G formula based on SCr of 0.56 mg/dL).  Liver Function Tests: Recent Labs  Lab 07/05/24 1629 07/08/24 1155 07/08/24 1548 07/09/24 1509  AST 14* 26 22 29   ALT 18 23 22 24   ALKPHOS 73 64 82 58  BILITOT 0.6 0.4 0.3 0.4  PROT 7.8 7.2 7.3 6.4*  ALBUMIN  4.3 3.9 4.3 3.5    CBG: Recent Labs  Lab 07/10/24 2145 07/11/24 0032 07/11/24 0445 07/11/24 0748 07/11/24 1223  GLUCAP 86 124* 60* 90 189*     Recent Results (from the past 240 hours)  Urine Culture     Status: Abnormal   Collection Time: 07/05/24  4:42 PM   Specimen: Urine, Random  Result Value Ref Range Status   Specimen Description URINE, RANDOM  Final   Special Requests   Final    NONE Reflexed from [id number] Performed at [facility], [address] [place], [place] [address]    Culture 40,000 COLONIES/mL ESCHERICHIA COLI (A)  Final   Report Status 07/07/2024 FINAL  Final   Organism ID, Bacteria ESCHERICHIA COLI (A)  Final      Susceptibility   Escherichia coli - MIC*    AMPICILLIN <=2 SENSITIVE Sensitive     CEFAZOLIN  (URINE) Value in next row Sensitive      <=1 SENSITIVEThis is a modified FDA-approved test that has been validated and its performance characteristics determined by the reporting laboratory.  This laboratory is certified under the Clinical Laboratory Improvement Amendments CLIA as qualified to perform high complexity clinical laboratory testing.    CEFEPIME Value in next row Sensitive      <=1 SENSITIVEThis is a modified FDA-approved test that has been validated and its performance characteristics determined by the reporting laboratory.  This laboratory is certified under the Clinical Laboratory Improvement Amendments CLIA as qualified to perform high complexity clinical laboratory testing.    ERTAPENEM Value in next row Sensitive      <=1 SENSITIVEThis is a modified FDA-approved test that has been validated and its performance  characteristics determined by the reporting laboratory.  This laboratory is certified under the Clinical Laboratory Improvement Amendments CLIA as qualified to perform high complexity clinical laboratory testing.    CEFTRIAXONE Value in next row Sensitive      <=1 SENSITIVEThis is a modified FDA-approved test that has been validated and its performance characteristics determined by the reporting laboratory.  This laboratory is certified under the Clinical Laboratory Improvement Amendments CLIA as qualified to perform high complexity clinical laboratory testing.    CIPROFLOXACIN Value in next row Sensitive      <=  1 SENSITIVEThis is a modified FDA-approved test that has been validated and its performance characteristics determined by the reporting laboratory.  This laboratory is certified under the Clinical Laboratory Improvement Amendments CLIA as qualified to perform high complexity clinical laboratory testing.    GENTAMICIN Value in next row Sensitive      <=1 SENSITIVEThis is a modified FDA-approved test that has been validated and its performance characteristics determined by the reporting laboratory.  This laboratory is certified under the Clinical Laboratory Improvement Amendments CLIA as qualified to perform high complexity clinical laboratory testing.    NITROFURANTOIN Value in next row Sensitive      <=1 SENSITIVEThis is a modified FDA-approved test that has been validated and its performance characteristics determined by the reporting laboratory.  This laboratory is certified under the Clinical Laboratory Improvement Amendments CLIA as qualified to perform high complexity clinical laboratory testing.    TRIMETH/SULFA Value in next row Sensitive      <=1 SENSITIVEThis is a modified FDA-approved test that has been validated and its performance characteristics determined by the reporting laboratory.  This laboratory is certified under the Clinical Laboratory Improvement Amendments CLIA as qualified  to perform high complexity clinical laboratory testing.    AMPICILLIN/SULBACTAM Value in next row Sensitive      <=1 SENSITIVEThis is a modified FDA-approved test that has been validated and its performance characteristics determined by the reporting laboratory.  This laboratory is certified under the Clinical Laboratory Improvement Amendments CLIA as qualified to perform high complexity clinical laboratory testing.    PIP/TAZO Value in next row Sensitive      <=4 SENSITIVEThis is a modified FDA-approved test that has been validated and its performance characteristics determined by the reporting laboratory.  This laboratory is certified under the Clinical Laboratory Improvement Amendments CLIA as qualified to perform high complexity clinical laboratory testing.    MEROPENEM Value in next row Sensitive      <=4 SENSITIVEThis is a modified FDA-approved test that has been validated and its performance characteristics determined by the reporting laboratory.  This laboratory is certified under the Clinical Laboratory Improvement Amendments CLIA as qualified to perform high complexity clinical laboratory testing.    * 40,000 COLONIES/mL ESCHERICHIA COLI         Radiology Studies: No results found.       Scheduled Meds:  amLODipine   2.5 mg Oral Daily   atorvastatin   80 mg Oral QHS   chlorhexidine   15 mL Mouth/Throat QID   enoxaparin  (LOVENOX ) injection  40 mg Subcutaneous Q24H   ezetimibe   10 mg Oral QHS   FLUoxetine   40 mg Oral BID   insulin  regular human CONCENTRATED  100 Units Subcutaneous BID WC   levothyroxine   75 mcg Oral QAC breakfast   linaclotide   290 mcg Oral QAC breakfast   losartan   50 mg Oral Daily   nebivolol   10 mg Oral Daily   potassium chloride   40 mEq Oral Q6H   Continuous Infusions:  clindamycin  (CLEOCIN ) IV 900 mg (07/11/24 0529)   lactated ringers  100 mL/hr at 07/10/24 1825   metronidazole  500 mg (07/11/24 0918)     LOS: 2 days     Brayton Lye,  MD [facility]   To contact the attending provider between 7A-7P or the covering provider during after hours 7P-7A, please log into the web site [email or web address] and access using universal [place] password for that web site. If you do not have the password, please call the hospital operator.  07/11/2024, 1:21  PM

## 2024-07-11 NOTE — Inpatient Diabetes Management (Addendum)
 Inpatient Diabetes Program Recommendations  AACE/ADA: New Consensus Statement on Inpatient Glycemic Control (2015)  Target Ranges:  Prepandial:   less than 140 mg/dL      Peak postprandial:   less than 180 mg/dL (1-2 hours)      Critically ill patients:  140 - 180 mg/dL   Lab Results  Component Value Date   GLUCAP 90 07/11/2024   HGBA1C 9.8 (H) 07/10/2024    Review of Glycemic Control  Latest Reference Range & Units 07/10/24 15:46 07/10/24 20:36 07/10/24 21:45 07/11/24 00:32 07/11/24 04:45 07/11/24 07:48  Glucose-Capillary 70 - 99 mg/dL 709 (H) 78 86 875 (H) 60 (L) 90   Diabetes history: DM 2 Outpatient Diabetes medications:  U500 190 units in the AM and U500 170 units q PM Current orders for Inpatient glycemic control:  U500 insulin  100 units tid with meals  Inpatient Diabetes Program Recommendations:    Consider reducing frequency of U500 insulin  100 units to bid with meals (breakfast and evening meal).   Addendum:  Spoke to patient regarding DM management.  She states that she felt like blood sugars were elevated due to infection.  She was taking U500 insulin  as prescribed.  She did have a Dexcom sensor but states she could not afford it.  Requested benefits check for CGM- Dexcom G7 copay is $64.60, Freestyle Libre 3 Plus copay is $29.13.  Patient states that she can afford Freestyle libre sensor co-pay of $29.13 per month. Per Dr. Sherlon, DM coordinator can place a Freestyle libre sensor prior to discharge.  Will follow up on 07/12/24.    Thanks,  Randall Bullocks, RN, BC-ADM Inpatient Diabetes Coordinator Pager [phone]  (8a-5p)

## 2024-07-12 ENCOUNTER — Encounter

## 2024-07-12 ENCOUNTER — Inpatient Hospital Stay (HOSPITAL_COMMUNITY)

## 2024-07-12 ENCOUNTER — Other Ambulatory Visit (HOSPITAL_COMMUNITY): Payer: Self-pay

## 2024-07-12 LAB — CBC
HCT: 33.9 % — ABNORMAL LOW (ref 36.0–46.0)
Hemoglobin: 12 g/dL (ref 12.0–15.0)
MCH: 30.2 pg (ref 26.0–34.0)
MCHC: 35.4 g/dL (ref 30.0–36.0)
MCV: 85.2 fL (ref 80.0–100.0)
Platelets: 309 K/uL (ref 150–400)
RBC: 3.98 MIL/uL (ref 3.87–5.11)
RDW: 13.8 % (ref 11.5–15.5)
WBC: 13.7 K/uL — ABNORMAL HIGH (ref 4.0–10.5)
nRBC: 0 % (ref 0.0–0.2)

## 2024-07-12 LAB — BASIC METABOLIC PANEL WITH GFR
Anion gap: 12 (ref 5–15)
BUN: 15 mg/dL (ref 6–20)
CO2: 20 mmol/L — ABNORMAL LOW (ref 22–32)
Calcium: 9.1 mg/dL (ref 8.9–10.3)
Chloride: 110 mmol/L (ref 98–111)
Creatinine, Ser: 0.62 mg/dL (ref 0.44–1.00)
GFR, Estimated: >60 mL/min (ref >60)
Glucose, Bld: 52 mg/dL — ABNORMAL LOW (ref 70–99)
Potassium: 2.9 mmol/L — ABNORMAL LOW (ref 3.5–5.1)
Sodium: 142 mmol/L (ref 135–145)

## 2024-07-12 LAB — GLUCOSE, CAPILLARY
Glucose-Capillary: 92 mg/dL (ref 70–99)
Glucose-Capillary: 123 mg/dL — ABNORMAL HIGH (ref 70–99)
Glucose-Capillary: 428 mg/dL — ABNORMAL HIGH (ref 70–99)
Glucose-Capillary: 290 mg/dL — ABNORMAL HIGH (ref 70–99)

## 2024-07-12 LAB — PROCALCITONIN: Procalcitonin: <0.1 ng/mL

## 2024-07-12 MED ORDER — DIPHENHYDRAMINE HCL 25 MG PO TABS
50.0000 mg | ORAL_TABLET | Freq: Three times a day (TID) | ORAL | 0 refills | Status: DC | PRN
  Filled 2024-07-12: qty 60, 10d supply, fill #0

## 2024-07-12 MED ORDER — POTASSIUM CHLORIDE CRYS ER 20 MEQ PO TBCR
40.0000 meq | EXTENDED_RELEASE_TABLET | ORAL | Status: AC
  Administered 2024-07-12 (×2): 40 meq via ORAL
  Filled 2024-07-12 (×2): qty 2

## 2024-07-12 MED ORDER — POTASSIUM CHLORIDE CRYS ER 20 MEQ PO TBCR
20.0000 meq | EXTENDED_RELEASE_TABLET | Freq: Every day | ORAL | 0 refills | Status: AC
  Filled 2024-07-12: qty 5, 5d supply, fill #0

## 2024-07-12 MED ORDER — AMOXICILLIN-POT CLAVULANATE 875-125 MG PO TABS
1.0000 | ORAL_TABLET | Freq: Two times a day (BID) | ORAL | 0 refills | Status: AC
  Filled 2024-07-12: qty 10, 5d supply, fill #0

## 2024-07-12 MED ORDER — DIPHENHYDRAMINE HCL 25 MG PO TABS
25.0000 mg | ORAL_TABLET | Freq: Four times a day (QID) | ORAL | 0 refills | Status: AC | PRN
  Filled 2024-07-12: qty 30, 8d supply, fill #0

## 2024-07-12 MED ORDER — AMOXICILLIN-POT CLAVULANATE 875-125 MG PO TABS: 1.0000 | ORAL_TABLET | Freq: Two times a day (BID) | ORAL | Status: DC

## 2024-07-12 MED ORDER — DIPHENHYDRAMINE HCL 25 MG PO CAPS: 25.0000 mg | ORAL_CAPSULE | Freq: Four times a day (QID) | ORAL | Status: DC | PRN

## 2024-07-12 MED ORDER — INSULIN ASPART 100 UNIT/ML IJ SOLN
15.0000 [IU] | Freq: Once | INTRAMUSCULAR | Status: AC
  Administered 2024-07-12: 14:00:00 15 [IU] via SUBCUTANEOUS
  Filled 2024-07-12: qty 15

## 2024-07-12 MED ORDER — AMOXICILLIN-POT CLAVULANATE 875-125 MG PO TABS
1.0000 | ORAL_TABLET | Freq: Two times a day (BID) | ORAL | Status: DC
  Administered 2024-07-12: 16:00:00 1 via ORAL
  Filled 2024-07-12: qty 1

## 2024-07-12 NOTE — TOC Transition Note (Signed)
 Transition of [name]) - Discharge Note   Patient Details  Name: Cheryl [name] MRN: [id number] Date of Birth: [birth year]  [name] CM/SW Contact:  Landry DELENA Senters, RN Phone Number: 07/12/2024, 3:55 PM   Clinical Narrative:    Patient will be discharging to home today, with family providing transportation.   No needs identified by CM.   Final next level of [name]: Home/Self [name] Barriers to Discharge: No Barriers Identified   Patient Goals and CMS Choice            Discharge Placement                       Discharge Plan and Services Additional resources added to the After Visit Summary for                                       Social Drivers of Health (SDOH) Interventions SDOH Screenings   Food Insecurity: Unknown (07/10/2024)  Housing: High Risk (07/10/2024)  Transportation Needs: Patient Declined (07/10/2024)  Depression (PHQ2-9): Low Risk (10/01/2021)  Social Connections: Unknown (12/07/2021)   Received from [facility]  Tobacco Use: Medium Risk (07/08/2024)     Readmission Risk Interventions     No data to display

## 2024-07-12 NOTE — Inpatient Diabetes Management (Signed)
 Inpatient Diabetes Program Recommendations  AACE/ADA: New Consensus Statement on Inpatient Glycemic Control (2015)  Target Ranges:  Prepandial:   less than 140 mg/dL      Peak postprandial:   less than 180 mg/dL (1-2 hours)      Critically ill patients:  140 - 180 mg/dL   Lab Results  Component Value Date   GLUCAP 290 (H) 07/12/2024   HGBA1C 9.8 (H) 07/10/2024    Review of Glycemic Control  Latest Reference Range & Units 07/11/24 16:36 07/11/24 22:08 07/12/24 05:33 07/12/24 07:28 07/12/24 11:43 07/12/24 15:12  Glucose-Capillary 70 - 99 mg/dL 851 (H) 874 (H) 92 876 (H) 428 (H) 290 (H)   Diabetes history: DM 2 Outpatient Diabetes medications:  U500 190 units in the AM and U500 170 units q PM Current orders for Inpatient glycemic control:  U500 insulin  100 units tid with meals   Inpatient Diabetes Program Recommendations:    Patient to discharge home today.  Per MD order, placed Freestyle Libre CGM.   Education done regarding application and changing CGM sensor (alternate every 15 days on back of arms), 1 hour warm-up, use of glucometer when alert displays, how to scan CGM for glucose reading and information for PCP. Patient has also been given educational packet regarding use CGM sensor including the 1-800 toll free number for any questions, problems or needs related to the [facility] sensors or reader.    Sensor applied by patient to ( R) Arm at (1545).  Explained that glucose readings will not be available until 1 hour after application. Reviewed use of CGM including how to scan, changing Sensor, Vitamin C warning, arrows with glucose readings, and Freestyle app.    Patient states that she missed her appt. With Dr. Faythe today but plans to f/u once out of the hospital.  She is appreciative of CGM and plans to share information with Dr. Faythe. She is happy that the co-pay is cheaper for her as well ($29).      Thanks,  Randall Bullocks, RN, BC-ADM Inpatient Diabetes Coordinator Pager  [phone]  (8a-5p)

## 2024-07-12 NOTE — Care Management Important Message (Signed)
 Important Message  Patient Details  Name: Cheryl [name] MRN: [id number] Date of Birth: 09/01/1965   Important Message Given:  Yes - Medicare IM     Claretta Deed 07/12/2024, 3:17 PM

## 2024-07-12 NOTE — Plan of Care (Signed)
   Problem: Coping: Goal: Ability to adjust to condition or change in health will improve Outcome: Progressing   Problem: Fluid Volume: Goal: Ability to maintain a balanced intake and output will improve Outcome: Progressing

## 2024-07-12 NOTE — Discharge Summary (Addendum)
 Physician Discharge Summary  Cheryl [name] FMW:[id number] DOB: 26-May-1966 DOA: 07/08/2024  PCP: Frederik Charleston, MD  Admit date: 07/08/2024 Discharge date: 07/12/2024  Admitted From: (Home) Disposition:  (Home)  Recommendations for Outpatient Follow-up:  Follow up with PCP in 1-2 weeks Please obtain BMP/CBC in one week    Diet recommendation: Carb Modified  Brief/Interim Summary:  Cheryl [name] is a 58 y.o. female with past medical history  of diabetes mellitus type 2 with history of DKA, patient with multiple teeth extraction 06/29/2024 at [facility] dentist program, and another I&D 07/05/2024 tooth abscess, she was discharged from ED 12/10 p.o. Doxy and Flagyl  for finding of her tooth abscess, patient was sent to ED by her PCP on 12/12 for hyperglycemia, in ED she was found be in DKA, so admission requested .   Diabetes mellitus, type II, poorly controlled with hyperglycemia DKA -Patient with DKA on presentation-on the DKA protocol, DKA has closed, currently transitioned to subcu insulin . - Changed back to her U-500 insulin  at home dose, I will hold on increasing as she had low CBGs on a lower dose here , diabetic coordinator was consulted, and she was connected to Dexcom 7 continuous monitor at time of discharge.       Left posterior mandible abscess Status post multiple teeth extractions -Status post multiple tooth extraction 06/29/2024 -Status post I&D of left posterior mandible abscess 07/05/2024 - Dated with IV clindamycin  and Flagyl  during hospital stay, she does have reported allergy, but by reviewing further records, it does appear she tolerated Augmentin  October 2020 and documented clearly by her provider so she will be given 1 dose of Augmentin  and monitored 1 hour prior to discharge, and discharged on 5 days of Augmentin . -Chlorhexidine  swish and spit 4 times daily -Patient has a follow-up with [facility] Dr. Mliss Mussel on 07/14/2024,  she was instructed to keep to keep that appointment, she will be provided with the imaging we SCDs at time of discharge. -Posterior oropharynx clear, no respiratory distress, no submandibular edema or signs of Ludwig's angina, workup include CRP, procalcitonin within normal limits which is reassuring, she has mild leukocytosis this is most likely in the setting of steroid she received as a prep for her contrast allergy, repeat CT neck soft tissue without contrast today to evaluate for her left posterior mandible abscess showing it decreased in size, with resolution of internal locules of gas.   History of CVA - Continue with Plavix    Hyperlipidemia - Continue with statin and Zetia   Hypokalemia -replaced prior to discharge, as well will be giving low-dose supplements for next 5 days    Essential hypertension - Continue with home regimen  Hypothyroidism -Continue Synthroid    GERD - Will keep on Pepcid   Anxiety/depression -Continue psych meds including Prozac , as needed Valium        Discharge Diagnoses:  Principal Problem:   DKA (diabetic ketoacidosis) (HCC)    Discharge Instructions  Discharge Instructions     Increase activity slowly   Complete by: As directed       Allergies as of 07/12/2024       Reactions   Influenza Vaccines Anaphylaxis, Swelling   Throat swelling, flu symptoms   Iodinated Contrast Media Shortness Of Breath, Nausea And Vomiting, Nausea Only, Rash   Iodine-131 Shortness Of Breath, Nausea And Vomiting, Rash   Iohexol  Anaphylaxis, Hives, Swelling    Desc: hives,dyspnea; throat swelling; ok w/ premeds and omnipaque    Levofloxacin Anaphylaxis, Hives   Nucynta [  tapentadol Hydrochloride] Other (See Comments)   Hallucinations and  insomnia x3 days   Pregabalin Other (See Comments)   (lyrica)Reaction-Syncope & unable to speak   Sulfonamide Derivatives Anaphylaxis   Vantin  [cefpodoxime ] Anaphylaxis, Hives, Rash, Cough   Vilazodone Hcl Other (See  Comments)   (Viibryd) Hallucinations   Amoxicillin  Hives, Itching   Noted 05/27/2019 patient prescribed outpatient regimen of Augmentin , follow-up note 06/06/2019 stated she had no issues/tolerated full regimen .   Biaxin [clarithromycin] Hives   Carbamazepine Itching   (Tegretol)   Ciprofloxacin Hives   Dilaudid  [hydromorphone  Hcl] Itching, Rash   redness   Iodides Nausea Only   Percocet [oxycodone -acetaminophen ] Rash        Medication List     STOP taking these medications    ibuprofen  800 MG tablet Commonly known as: ADVIL    metroNIDAZOLE  500 MG tablet Commonly known as: FLAGYL        TAKE these medications    albuterol  108 (90 Base) MCG/ACT inhaler Commonly known as: VENTOLIN  HFA INHALE 2 PUFFS EVERY 6 HOURS AS NEEDED FOR SHORTNESS OF BREATH OR WHEEZING . NEED OFFICE VISIT FOR FURTHER REFILLS. What changed: See the new instructions.   amLODipine  2.5 MG tablet Commonly known as: NORVASC  Take 2.5 mg by mouth daily.   amoxicillin -clavulanate 875-125 MG tablet Commonly known as: AUGMENTIN  Take 1 tablet by mouth every 12 (twelve) hours for 5 days.   atorvastatin  80 MG tablet Commonly known as: LIPITOR  Take 80 mg by mouth daily.   clopidogrel  75 MG tablet Commonly known as: PLAVIX  TAKE 1 TABLET (75 MG TOTAL) BY MOUTH DAILY.   diphenhydrAMINE  25 MG tablet Commonly known as: BENADRYL  Take 1 tablet (25 mg total) by mouth every 6 (six) hours as needed (if patient exhibits significant signs and symptoms of allergic reaction).   Droplet Pen Needles 32G X 4 MM Misc Generic drug: Insulin  Pen Needle 100 each.   Droplet Pen Needles 32G X 4 MM Misc Generic drug: Insulin  Pen Needle 100 each.   ezetimibe  10 MG tablet Commonly known as: ZETIA  Take 10 mg by mouth daily.   FLUoxetine  40 MG capsule Commonly known as: PROZAC  Take 40 mg by mouth 2 (two) times daily.   HumuLIN  R U-500 KwikPen 500 UNIT/ML KwikPen Generic drug: insulin  regular human  CONCENTRATED Inject into the skin 2 (two) times daily. 190 units in the morning and 170 units at night   hyoscyamine  0.125 MG tablet Commonly known as: LEVSIN  TAKE 1 TABLET EVERY 6 HOURS AS NEEDED FOR CRAMPING.   ipratropium-albuterol  0.5-2.5 (3) MG/3ML Soln Commonly known as: DUONEB Take 3 mLs by nebulization every 6 (six) hours as needed. Take 3mL by nebulization every 4-6 hours if needed   levothyroxine  75 MCG tablet Commonly known as: SYNTHROID  Take 75 mcg by mouth daily before breakfast.   linaclotide  290 MCG Caps capsule Commonly known as: Linzess  Take 1 capsule (290 mcg total) by mouth daily before breakfast.   losartan  100 MG tablet Commonly known as: COZAAR  Take 100 mg by mouth daily.   nebivolol  10 MG tablet Commonly known as: BYSTOLIC  Take 1 tablet (10 mg total) by mouth daily. Please call [phone] to schedule an overdue appointment for future refills. Thank you. 3rd final attempt.   Nebulizer Mask Adult/Tubing Misc 1 each by Does not apply route as directed.   nystatin -triamcinolone  cream Commonly known as: MYCOLOG II Apply 1 application topically 2 (two) times daily as needed (yeast in skin folds).   pantoprazole  40 MG tablet Commonly known as:  PROTONIX  Take 1 tablet (40 mg total) by mouth 2 (two) times daily.   potassium chloride  SA 20 MEQ tablet Commonly known as: KLOR-CON  M Take 1 tablet (20 mEq total) by mouth daily.   promethazine  25 MG tablet Commonly known as: PHENERGAN  TAKE 1 TABLET TWICE DAILY AS NEEDED FOR NAUSEA   rizatriptan 10 MG tablet Commonly known as: MAXALT Take 10 mg by mouth as needed for migraine.   tiZANidine  4 MG tablet Commonly known as: ZANAFLEX  Take 4 mg by mouth 4 (four) times daily. May hold one dose if needed to operate motor vehicle.   Trelegy Ellipta  100-62.5-25 MCG/ACT Aepb Generic drug: Fluticasone -Umeclidin-Vilant INHALE 1 PUFF INTO THE LUNGS DAILY What changed: how much to take   True Metrix Blood Glucose  Test test strip Generic drug: glucose blood 1 each by Other route as needed.   True Metrix Meter w/Device Kit 1 each.   TRUEplus Lancets 33G Misc 100 each.   valACYclovir 1000 MG tablet Commonly known as: VALTREX Take 2,000 mg by mouth as needed.        Allergies[1]  Consultations: None   Procedures/Studies: CT SOFT TISSUE NECK WO CONTRAST Result Date: 07/12/2024 EXAM: CT NECK WITHOUT CONTRAST 07/12/2024 12:23:00 PM TECHNIQUE: CT of the neck was performed without the administration of intravenous contrast. Multiplanar reformatted images are provided for review. Automated exposure control, iterative reconstruction, and/or weight based adjustment of the mA/kV was utilized to reduce the radiation dose to as low as reasonably achievable. COMPARISON: CT neck 07/15/24 and 07/05/2024. CLINICAL HISTORY: With known history of left posterior mandible abscess, clinically improving but had leukocytosis today, please reassess. FINDINGS: AERODIGESTIVE TRACT: No discrete mass. No edema. SALIVARY GLANDS: The parotid and submandibular glands are unremarkable. THYROID : Unremarkable. LYMPH NODES: No suspicious cervical lymphadenopathy. SOFT TISSUES: Redemonstration of nodular soft tissue along the left posterior body of the mandible involving the left buccal space which measures 1.3 x 1.2 x 1.5 cm corresponding to the cavitated region of soft tissue on the prior study which measured 1.5 x 1.3 x 2.1 cm. There are no residual locules of gas appreciated within this collection. There is mild adjacent inflammatory stranding. Suspect persistent abscess at this level although evaluation is somewhat limited by the lack of intravenous contrast. Similar gas within the adjacent left molar extraction site. BRAIN, ORBITS, SINUSES AND MASTOIDS: Scattered mucosal thickening in the paranasal sinuses most pronounced in the left maxillary sinus which is slightly increased from prior. LUNGS AND MEDIASTINUM: No acute  abnormality. BONES: C5-C6 anterior cervical fusion hardware. IMPRESSION: 1. Nodular soft tissue along the left posterior body of the mandible involving the left buccal space corresponding to abscess seen on prior studies, decreased in size since 2024/07/15. Resolution of internal locules of gas. Electronically signed by: Donnice Mania MD 07/12/2024 03:46 PM EST RP Workstation: [id number]   DG Chest Portable 1 View Result Date: 07/08/2024 EXAM: 1 VIEW(S) XRAY OF THE CHEST 07/08/2024 04:45:00 PM COMPARISON: 11/04/2022. CLINICAL HISTORY: hyperglycemia FINDINGS: LINES, TUBES AND DEVICES: Left chest atrial loop recorder noted. LUNGS AND PLEURA: No focal pulmonary opacity. No pleural effusion. No pneumothorax. HEART AND MEDIASTINUM: No acute abnormality of the cardiac and mediastinal silhouettes. BONES AND SOFT TISSUES: Partially visible lower cervical ACDF hardware noted. No acute osseous abnormality. IMPRESSION: 1. No acute cardiopulmonary process. Electronically signed by: Pinkie Pebbles MD 07/08/2024 05:50 PM EST RP Workstation: [id number]   CT Soft Tissue Neck W Contrast Result Date: 07-15-2024 EXAM: CT NECK WITH CONTRAST July 15, 2024 05:22:36 AM TECHNIQUE: CT of the  neck was performed with the administration of 75 mL of iohexol  (OMNIPAQUE ) 350 MG/ML injection. Multiplanar reformatted images are provided for review. Automated exposure control, iterative reconstruction, and/or weight based adjustment of the mA/kV was utilized to reduce the radiation dose to as low as reasonably achievable. COMPARISON: None available. CLINICAL HISTORY: Soft tissue infection suspected, neck, xray done. FINDINGS: AERODIGESTIVE TRACT: No discrete mass. No edema. SALIVARY GLANDS: The parotid and submandibular glands are unremarkable. THYROID : Unremarkable. LYMPH NODES: There are a few shotty cervical lymph nodes. SOFT TISSUES: Just lateral to the lucent defect in the left posterior mandible, there is an essentially cavitated soft tissue  lesion measuring approximately 15 x 13 x 21 mm, likely representing a small abscess. BRAIN, ORBITS, SINUSES AND MASTOIDS: The patient is status post right sinonasal surgery with an antral window. There is a left concha bullosa. LUNGS AND MEDIASTINUM: No acute abnormality. BONES: There is a lucent defect containing a pocket of air within the posterior body of the mandible on the left, compatible with a recently extracted molar. Just lateral to this, there is an essentially cavitated soft tissue lesion measuring approximately 15 x 13 x 21 mm, likely representing a small abscess. There is also a lucent lesion within the posterior body of the mandible on the right, with a stable appearance. There are no obvious inflammatory changes on the right. The patient is status post ACDF at C5-C6 with completed fusion. IMPRESSION: 1. Small cavitated soft tissue abscess in the left posterior body of the mandible adjacent to a recently extracted molar, measuring approximately 15 x 13 x 21 mm; correlate clinically and consider dental/oral maxillofacial surgical evaluation for drainage as indicated 2. No obvious inflammatory changes on the right, with a stable lucent lesion in the right posterior mandible without adjacent inflammatory changes Electronically signed by: Evalene Coho MD 07/06/2024 05:36 AM EST RP Workstation: [id number]   CT Soft Tissue Neck Wo Contrast Result Date: 07/05/2024 EXAM: CT NECK WITHOUT CONTRAST 07/05/2024 04:49:37 PM TECHNIQUE: CT of the neck was performed without the administration of intravenous contrast. Multiplanar reformatted images are provided for review. Automated exposure control, iterative reconstruction, and/or weight based adjustment of the mA/kV was utilized to reduce the radiation dose to as low as reasonably achievable. COMPARISON: None available. CLINICAL HISTORY: jaw pain FINDINGS: AERODIGESTIVE TRACT: No evidence of mass along the visualized aerodigestive structures in the neck. No  edema. SALIVARY GLANDS: The parotid and submandibular glands are unremarkable. THYROID : Unremarkable. LYMPH NODES: There is a mildly prominent left level 1b cervical node which is likely reactive. No enlarged right sided cervical lymph nodes. SOFT TISSUES: There is soft tissue swelling at the left buccal space with locules of gas appreciated. There is no focal fluid collection identified. Evaluation for abscess is somewhat limited given lack of intravenous contrast. There is stranding within the subcutaneous fat overlying the left mandible concerning for cellulitis. Stranding extends into the left anterior neck. There is asymmetric thickening of the left platysma. BRAIN, ORBITS, SINUSES AND MASTOIDS: Mucosal thickening in the maxillary sinuses, left greater than right. Postsurgical changes of the right maxillary sinus. No acute abnormality. LUNGS AND MEDIASTINUM: Azygos lobe visualized. Lung apices are otherwise unremarkable. BONES: Multiple dental extraction sites along the mandible noted bilaterally. Edentulous maxilla. Anterior cervical fusion hardware at C5-C6. Mild degenerative changes in the visualized spine. No focal bone abnormality. IMPRESSION: 1. Soft tissue swelling at the left buccal space with locules of gas, concerning for cellulitis. No focal fluid collection identified; assessment for abscess is limited without  IV contrast. 2. Stranding within the subcutaneous fat overlying the left mandible extending into the left anterior neck, concerning for cellulitis. 3. Mildly prominent left level 1b cervical node, likely reactive. No enlarged right-sided cervical lymph nodes. Electronically signed by: Donnice Mania MD 07/05/2024 05:47 PM EST RP Workstation: [id number]      Subjective:  Denies any complaints this morning, reports left jaw pain much improved, denies any difficulty swallowing or breathing Discharge Exam: Vitals:   07/12/24 1141 07/12/24 1504  BP: (!) 123/59 138/69  Pulse:    Resp:     Temp: 97.9 F (36.6 C) 98.7 F (37.1 C)  SpO2:     Vitals:   07/12/24 0726 07/12/24 0800 07/12/24 1141 07/12/24 1504  BP: (!) 118/52 138/72 (!) 123/59 138/69  Pulse:  74    Resp:  13    Temp:   97.9 F (36.6 C) 98.7 F (37.1 C)  TempSrc: Oral  Oral Axillary  SpO2:  97%    Weight:      Height:        General: Pt is alert, awake, not in acute distress, neck with no lymphadenopathy, or submental edema or erythema or tenderness, exam with no significant pharyngeal edema or erythema, she has mild swelling and tenderness and down left posterior jaw area stable Cardiovascular: RRR, S1/S2 +, no rubs, no gallops Respiratory: CTA bilaterally, no wheezing, no rhonchi Abdominal: Soft, NT, ND, bowel sounds + Extremities: no edema, no cyanosis    The results of significant diagnostics from this hospitalization (including imaging, microbiology, ancillary and laboratory) are listed below for reference.     Microbiology: Recent Results (from the past 240 hours)  Urine Culture     Status: Abnormal   Collection Time: 07/05/24  4:42 PM   Specimen: Urine, Random  Result Value Ref Range Status   Specimen Description URINE, RANDOM  Final   Special Requests   Final    NONE Reflexed from [id number] Performed at [facility], [address], [place], [place] [address]    Culture 40,000 COLONIES/mL ESCHERICHIA COLI (A)  Final   Report Status 07/07/2024 FINAL  Final   Organism ID, Bacteria ESCHERICHIA COLI (A)  Final      Susceptibility   Escherichia coli - MIC*    AMPICILLIN <=2 SENSITIVE Sensitive     CEFAZOLIN  (URINE) Value in next row Sensitive      <=1 SENSITIVEThis is a modified FDA-approved test that has been validated and its performance characteristics determined by the reporting laboratory.  This laboratory is certified under the Clinical Laboratory Improvement Amendments CLIA as qualified to perform high complexity clinical laboratory testing.    CEFEPIME Value in next row  Sensitive      <=1 SENSITIVEThis is a modified FDA-approved test that has been validated and its performance characteristics determined by the reporting laboratory.  This laboratory is certified under the Clinical Laboratory Improvement Amendments CLIA as qualified to perform high complexity clinical laboratory testing.    ERTAPENEM Value in next row Sensitive      <=1 SENSITIVEThis is a modified FDA-approved test that has been validated and its performance characteristics determined by the reporting laboratory.  This laboratory is certified under the Clinical Laboratory Improvement Amendments CLIA as qualified to perform high complexity clinical laboratory testing.    CEFTRIAXONE Value in next row Sensitive      <=1 SENSITIVEThis is a modified FDA-approved test that has been validated and its performance characteristics determined by the reporting laboratory.  This  laboratory is certified under the Clinical Laboratory Improvement Amendments CLIA as qualified to perform high complexity clinical laboratory testing.    CIPROFLOXACIN Value in next row Sensitive      <=1 SENSITIVEThis is a modified FDA-approved test that has been validated and its performance characteristics determined by the reporting laboratory.  This laboratory is certified under the Clinical Laboratory Improvement Amendments CLIA as qualified to perform high complexity clinical laboratory testing.    GENTAMICIN Value in next row Sensitive      <=1 SENSITIVEThis is a modified FDA-approved test that has been validated and its performance characteristics determined by the reporting laboratory.  This laboratory is certified under the Clinical Laboratory Improvement Amendments CLIA as qualified to perform high complexity clinical laboratory testing.    NITROFURANTOIN Value in next row Sensitive      <=1 SENSITIVEThis is a modified FDA-approved test that has been validated and its performance characteristics determined by the reporting  laboratory.  This laboratory is certified under the Clinical Laboratory Improvement Amendments CLIA as qualified to perform high complexity clinical laboratory testing.    TRIMETH/SULFA Value in next row Sensitive      <=1 SENSITIVEThis is a modified FDA-approved test that has been validated and its performance characteristics determined by the reporting laboratory.  This laboratory is certified under the Clinical Laboratory Improvement Amendments CLIA as qualified to perform high complexity clinical laboratory testing.    AMPICILLIN/SULBACTAM Value in next row Sensitive      <=1 SENSITIVEThis is a modified FDA-approved test that has been validated and its performance characteristics determined by the reporting laboratory.  This laboratory is certified under the Clinical Laboratory Improvement Amendments CLIA as qualified to perform high complexity clinical laboratory testing.    PIP/TAZO Value in next row Sensitive      <=4 SENSITIVEThis is a modified FDA-approved test that has been validated and its performance characteristics determined by the reporting laboratory.  This laboratory is certified under the Clinical Laboratory Improvement Amendments CLIA as qualified to perform high complexity clinical laboratory testing.    MEROPENEM Value in next row Sensitive      <=4 SENSITIVEThis is a modified FDA-approved test that has been validated and its performance characteristics determined by the reporting laboratory.  This laboratory is certified under the Clinical Laboratory Improvement Amendments CLIA as qualified to perform high complexity clinical laboratory testing.    * 40,000 COLONIES/mL ESCHERICHIA COLI     Labs: BNP (last 3 results) No results for input(s): BNP in the last 8760 hours. Basic Metabolic Panel: Recent Labs  Lab 07/09/24 0758 07/09/24 1321 07/09/24 1509 07/09/24 2037 07/10/24 0329 07/11/24 0247 07/12/24 0335  NA 140   < > 140 138 136 141 142  K 2.9*   < > 3.1* 4.1 3.4*  2.8* 2.9*  CL 111   < > 117* 111 108 114* 110  CO2 17*   < > 19* 16* 19* 18* 20*  GLUCOSE 204*   < > 153* 414* 327* 95 52*  BUN 7   < > 5* 6 6 12 15   CREATININE 0.60   < > 0.62 0.63 0.70 0.56 0.62  CALCIUM  9.1   < > 8.6* 8.5* 8.7* 9.2 9.1  MG 1.9  --   --   --   --  2.1  --    < > = values in this interval not displayed.   Liver Function Tests: Recent Labs  Lab 07/05/24 1629 07/08/24 1155 07/08/24 1548 07/09/24 1509  AST 14*  26 22 29   ALT 18 23 22 24   ALKPHOS 73 64 82 58  BILITOT 0.6 0.4 0.3 0.4  PROT 7.8 7.2 7.3 6.4*  ALBUMIN  4.3 3.9 4.3 3.5   No results for input(s): LIPASE, AMYLASE in the last 168 hours. No results for input(s): AMMONIA in the last 168 hours. CBC: Recent Labs  Lab 07/05/24 1629 07/08/24 1155 07/08/24 1213 07/08/24 1548 07/08/24 1558 07/08/24 1812 07/09/24 1509 07/11/24 0247 07/12/24 0335  WBC 13.8* 11.3*  --  9.7  --   --  8.7 10.6* 13.7*  NEUTROABS 9.0* 7.8*  --  5.7  --   --  4.7  --   --   HGB 13.5 12.0   < > 11.7* 11.2* 9.9* 11.6* 11.6* 12.0  HCT 38.5 34.4*   < > 32.9* 33.0* 29.0* 32.2* 32.6* 33.9*  MCV 85.4 84.9  --  83.3  --   --  82.8 83.8 85.2  PLT 424* 380  --  313  --   --  312 275 309   < > = values in this interval not displayed.   Cardiac Enzymes: No results for input(s): CKTOTAL, CKMB, CKMBINDEX, TROPONINI in the last 168 hours. BNP: Invalid input(s): POCBNP CBG: Recent Labs  Lab 07/11/24 2208 07/12/24 0533 07/12/24 0728 07/12/24 1143 07/12/24 1512  GLUCAP 125* 92 123* 428* 290*   D-Dimer No results for input(s): DDIMER in the last 72 hours. Hgb A1c Recent Labs    07/10/24 0329  HGBA1C 9.8*   Lipid Profile No results for input(s): CHOL, HDL, LDLCALC, TRIG, CHOLHDL, LDLDIRECT in the last 72 hours. Thyroid  function studies No results for input(s): TSH, T4TOTAL, T3FREE, THYROIDAB in the last 72 hours.  Invalid input(s): FREET3 Anemia work up No results for input(s):  VITAMINB12, FOLATE, FERRITIN, TIBC, IRON, RETICCTPCT in the last 72 hours. Urinalysis    Component Value Date/Time   COLORURINE YELLOW 07/08/2024 1548   APPEARANCEUR CLEAR 07/08/2024 1548   LABSPEC 1.020 07/08/2024 1548   PHURINE 6.5 07/08/2024 1548   GLUCOSEU >1,000 (A) 07/08/2024 1548   GLUCOSEU >=1000 (A) 02/12/2024 1511   HGBUR SMALL (A) 07/08/2024 1548   BILIRUBINUR NEGATIVE 07/08/2024 1548   KETONESUR NEGATIVE 07/08/2024 1548   PROTEINUR TRACE (A) 07/08/2024 1548   UROBILINOGEN 0.2 02/12/2024 1511   NITRITE NEGATIVE 07/08/2024 1548   LEUKOCYTESUR SMALL (A) 07/08/2024 1548   Sepsis Labs Recent Labs  Lab 07/08/24 1548 07/09/24 1509 07/11/24 0247 07/12/24 0335  WBC 9.7 8.7 10.6* 13.7*   Microbiology Recent Results (from the past 240 hours)  Urine Culture     Status: Abnormal   Collection Time: 07/05/24  4:42 PM   Specimen: Urine, Random  Result Value Ref Range Status   Specimen Description URINE, RANDOM  Final   Special Requests   Final    NONE Reflexed from [phone] Performed at [facility], [address], [place], [place] [address]    Culture 40,000 COLONIES/mL ESCHERICHIA COLI (A)  Final   Report Status 07/07/2024 FINAL  Final   Organism ID, Bacteria ESCHERICHIA COLI (A)  Final      Susceptibility   Escherichia coli - MIC*    AMPICILLIN <=2 SENSITIVE Sensitive     CEFAZOLIN  (URINE) Value in next row Sensitive      <=1 SENSITIVEThis is a modified FDA-approved test that has been validated and its performance characteristics determined by the reporting laboratory.  This laboratory is certified under the Clinical Laboratory Improvement Amendments CLIA as qualified to  perform high complexity clinical laboratory testing.    CEFEPIME Value in next row Sensitive      <=1 SENSITIVEThis is a modified FDA-approved test that has been validated and its performance characteristics determined by the reporting laboratory.  This laboratory is certified under the  Clinical Laboratory Improvement Amendments CLIA as qualified to perform high complexity clinical laboratory testing.    ERTAPENEM Value in next row Sensitive      <=1 SENSITIVEThis is a modified FDA-approved test that has been validated and its performance characteristics determined by the reporting laboratory.  This laboratory is certified under the Clinical Laboratory Improvement Amendments CLIA as qualified to perform high complexity clinical laboratory testing.    CEFTRIAXONE Value in next row Sensitive      <=1 SENSITIVEThis is a modified FDA-approved test that has been validated and its performance characteristics determined by the reporting laboratory.  This laboratory is certified under the Clinical Laboratory Improvement Amendments CLIA as qualified to perform high complexity clinical laboratory testing.    CIPROFLOXACIN Value in next row Sensitive      <=1 SENSITIVEThis is a modified FDA-approved test that has been validated and its performance characteristics determined by the reporting laboratory.  This laboratory is certified under the Clinical Laboratory Improvement Amendments CLIA as qualified to perform high complexity clinical laboratory testing.    GENTAMICIN Value in next row Sensitive      <=1 SENSITIVEThis is a modified FDA-approved test that has been validated and its performance characteristics determined by the reporting laboratory.  This laboratory is certified under the Clinical Laboratory Improvement Amendments CLIA as qualified to perform high complexity clinical laboratory testing.    NITROFURANTOIN Value in next row Sensitive      <=1 SENSITIVEThis is a modified FDA-approved test that has been validated and its performance characteristics determined by the reporting laboratory.  This laboratory is certified under the Clinical Laboratory Improvement Amendments CLIA as qualified to perform high complexity clinical laboratory testing.    TRIMETH/SULFA Value in next row Sensitive       <=1 SENSITIVEThis is a modified FDA-approved test that has been validated and its performance characteristics determined by the reporting laboratory.  This laboratory is certified under the Clinical Laboratory Improvement Amendments CLIA as qualified to perform high complexity clinical laboratory testing.    AMPICILLIN/SULBACTAM Value in next row Sensitive      <=1 SENSITIVEThis is a modified FDA-approved test that has been validated and its performance characteristics determined by the reporting laboratory.  This laboratory is certified under the Clinical Laboratory Improvement Amendments CLIA as qualified to perform high complexity clinical laboratory testing.    PIP/TAZO Value in next row Sensitive      <=4 SENSITIVEThis is a modified FDA-approved test that has been validated and its performance characteristics determined by the reporting laboratory.  This laboratory is certified under the Clinical Laboratory Improvement Amendments CLIA as qualified to perform high complexity clinical laboratory testing.    MEROPENEM Value in next row Sensitive      <=4 SENSITIVEThis is a modified FDA-approved test that has been validated and its performance characteristics determined by the reporting laboratory.  This laboratory is certified under the Clinical Laboratory Improvement Amendments CLIA as qualified to perform high complexity clinical laboratory testing.    * 40,000 COLONIES/mL ESCHERICHIA COLI     Time coordinating discharge: Over 30 minutes  SIGNED:   Brayton Lye, MD  [facility] 07/12/2024, 4:05 PM Pager   If 7PM-7AM, please contact night-coverage [email or web address] Password [email or web address]     [  1]  Allergies Allergen Reactions   Influenza Vaccines Anaphylaxis and Swelling    Throat swelling, flu symptoms   Iodinated Contrast Media Shortness Of Breath, Nausea And Vomiting, Nausea Only and Rash   Iodine-131 Shortness Of Breath, Nausea And Vomiting and Rash   Iohexol   Anaphylaxis, Hives and Swelling     Desc: hives,dyspnea; throat swelling; ok w/ premeds and omnipaque     Levofloxacin Anaphylaxis and Hives   Nucynta [Tapentadol Hydrochloride] Other (See Comments)    Hallucinations and  insomnia x3 days   Pregabalin Other (See Comments)    (lyrica)Reaction-Syncope & unable to speak   Sulfonamide Derivatives Anaphylaxis   Vantin  [Cefpodoxime ] Anaphylaxis, Hives, Rash and Cough   Vilazodone Hcl Other (See Comments)    (Viibryd) Hallucinations   Amoxicillin  Hives and Itching    Noted 05/27/2019 patient prescribed outpatient regimen of Augmentin , follow-up note 06/06/2019 stated she had no issues/tolerated full regimen .   Biaxin [Clarithromycin] Hives   Carbamazepine Itching    (Tegretol)   Ciprofloxacin Hives   Dilaudid  [Hydromorphone  Hcl] Itching and Rash    redness   Iodides Nausea Only   Percocet [Oxycodone -Acetaminophen ] Rash

## 2024-07-13 ENCOUNTER — Ambulatory Visit

## 2024-07-13 DIAGNOSIS — I639 Cerebral infarction, unspecified: Secondary | ICD-10-CM | POA: Diagnosis not present

## 2024-07-13 DIAGNOSIS — E1110 Type 2 diabetes mellitus with ketoacidosis without coma: Secondary | ICD-10-CM | POA: Diagnosis not present

## 2024-07-14 ENCOUNTER — Encounter

## 2024-07-14 LAB — CUP PACEART REMOTE DEVICE CHECK
Date Time Interrogation Session: 20251216230843
Implantable Pulse Generator Implant Date: 20210831

## 2024-07-14 NOTE — Progress Notes (Signed)
 Remote Loop Recorder Transmission

## 2024-07-21 ENCOUNTER — Encounter

## 2024-08-12 ENCOUNTER — Encounter

## 2024-08-13 ENCOUNTER — Ambulatory Visit: Attending: Cardiology

## 2024-08-13 DIAGNOSIS — I639 Cerebral infarction, unspecified: Secondary | ICD-10-CM

## 2024-08-15 ENCOUNTER — Encounter

## 2024-08-16 ENCOUNTER — Ambulatory Visit: Payer: Self-pay | Admitting: Cardiology

## 2024-08-16 LAB — CUP PACEART REMOTE DEVICE CHECK
Date Time Interrogation Session: 20260116230518
Implantable Pulse Generator Implant Date: 20210831

## 2024-08-18 NOTE — Progress Notes (Signed)
 Remote Loop Recorder Transmission

## 2024-08-22 ENCOUNTER — Encounter

## 2024-09-02 ENCOUNTER — Encounter: Admitting: Dietician

## 2024-09-12 ENCOUNTER — Encounter

## 2024-09-12 ENCOUNTER — Institutional Professional Consult (permissible substitution): Admitting: Neurology

## 2024-09-13 ENCOUNTER — Ambulatory Visit

## 2024-09-15 ENCOUNTER — Encounter

## 2024-09-16 ENCOUNTER — Encounter: Admitting: Dietician

## 2024-09-22 ENCOUNTER — Encounter

## 2024-09-28 ENCOUNTER — Institutional Professional Consult (permissible substitution): Admitting: Neurology

## 2024-10-13 ENCOUNTER — Encounter

## 2024-10-14 ENCOUNTER — Ambulatory Visit

## 2024-10-17 ENCOUNTER — Encounter

## 2024-10-24 ENCOUNTER — Encounter

## 2024-11-13 ENCOUNTER — Encounter

## 2024-11-14 ENCOUNTER — Ambulatory Visit

## 2024-11-17 ENCOUNTER — Encounter
